# Patient Record
Sex: Female | Born: 1960 | Race: White | Hispanic: No | Smoking: Current every day smoker
Health system: Southern US, Community
[De-identification: ages and names within clinical notes are randomized; demographics above are authoritative.]

## PROBLEM LIST (undated history)

## (undated) DIAGNOSIS — J449 Chronic obstructive pulmonary disease, unspecified: Secondary | ICD-10-CM

## (undated) DIAGNOSIS — G2581 Restless legs syndrome: Secondary | ICD-10-CM

## (undated) DIAGNOSIS — F419 Anxiety disorder, unspecified: Secondary | ICD-10-CM

## (undated) DIAGNOSIS — Z87898 Personal history of other specified conditions: Secondary | ICD-10-CM

## (undated) DIAGNOSIS — F410 Panic disorder [episodic paroxysmal anxiety] without agoraphobia: Secondary | ICD-10-CM

## (undated) DIAGNOSIS — R519 Headache, unspecified: Secondary | ICD-10-CM

## (undated) DIAGNOSIS — R002 Palpitations: Secondary | ICD-10-CM

## (undated) DIAGNOSIS — J189 Pneumonia, unspecified organism: Secondary | ICD-10-CM

## (undated) DIAGNOSIS — I700 Atherosclerosis of aorta: Secondary | ICD-10-CM

## (undated) DIAGNOSIS — M810 Age-related osteoporosis without current pathological fracture: Secondary | ICD-10-CM

## (undated) DIAGNOSIS — N189 Chronic kidney disease, unspecified: Secondary | ICD-10-CM

## (undated) DIAGNOSIS — R0600 Dyspnea, unspecified: Secondary | ICD-10-CM

## (undated) DIAGNOSIS — I959 Hypotension, unspecified: Secondary | ICD-10-CM

## (undated) DIAGNOSIS — F32A Depression, unspecified: Secondary | ICD-10-CM

## (undated) HISTORY — DX: Age-related osteoporosis without current pathological fracture: M81.0

## (undated) HISTORY — DX: Personal history of other specified conditions: Z87.898

## (undated) HISTORY — DX: Depression, unspecified: F32.A

## (undated) HISTORY — DX: Atherosclerosis of aorta: I70.0

## (undated) HISTORY — DX: Panic disorder (episodic paroxysmal anxiety): F41.0

## (undated) HISTORY — DX: Hypotension, unspecified: I95.9

## (undated) HISTORY — DX: Palpitations: R00.2

## (undated) HISTORY — PX: ABDOMINAL HYSTERECTOMY: SHX81

---

## 2012-12-12 ENCOUNTER — Emergency Department (HOSPITAL_COMMUNITY): Payer: Self-pay

## 2012-12-12 ENCOUNTER — Emergency Department (HOSPITAL_COMMUNITY)
Admission: EM | Admit: 2012-12-12 | Discharge: 2012-12-12 | Disposition: A | Discharge status: Home / Self Care | Payer: Self-pay | Attending: Emergency Medicine | Admitting: Emergency Medicine

## 2012-12-12 ENCOUNTER — Encounter (HOSPITAL_COMMUNITY): Payer: Self-pay | Admitting: *Deleted

## 2012-12-12 DIAGNOSIS — J069 Acute upper respiratory infection, unspecified: Secondary | ICD-10-CM | POA: Insufficient documentation

## 2012-12-12 DIAGNOSIS — R5383 Other fatigue: Secondary | ICD-10-CM | POA: Insufficient documentation

## 2012-12-12 DIAGNOSIS — R059 Cough, unspecified: Secondary | ICD-10-CM | POA: Insufficient documentation

## 2012-12-12 DIAGNOSIS — F17200 Nicotine dependence, unspecified, uncomplicated: Secondary | ICD-10-CM | POA: Insufficient documentation

## 2012-12-12 DIAGNOSIS — R071 Chest pain on breathing: Secondary | ICD-10-CM | POA: Insufficient documentation

## 2012-12-12 DIAGNOSIS — IMO0001 Reserved for inherently not codable concepts without codable children: Secondary | ICD-10-CM | POA: Insufficient documentation

## 2012-12-12 DIAGNOSIS — R5381 Other malaise: Secondary | ICD-10-CM | POA: Insufficient documentation

## 2012-12-12 DIAGNOSIS — R1110 Vomiting, unspecified: Secondary | ICD-10-CM | POA: Insufficient documentation

## 2012-12-12 DIAGNOSIS — R05 Cough: Secondary | ICD-10-CM | POA: Insufficient documentation

## 2012-12-12 DIAGNOSIS — Z79899 Other long term (current) drug therapy: Secondary | ICD-10-CM | POA: Insufficient documentation

## 2012-12-12 DIAGNOSIS — R51 Headache: Secondary | ICD-10-CM | POA: Insufficient documentation

## 2012-12-12 LAB — BASIC METABOLIC PANEL
BUN: 10 mg/dL (ref 6–23)
CO2: 27 mEq/L (ref 19–32)
Chloride: 97 mEq/L (ref 96–112)
Creatinine, Ser: 0.76 mg/dL (ref 0.50–1.10)
GFR calc Af Amer: >90 mL/min (ref >90)
Glucose, Bld: 110 mg/dL — ABNORMAL HIGH (ref 70–99)

## 2012-12-12 LAB — CBC WITH DIFFERENTIAL/PLATELET
Basophils Absolute: 0 10*3/uL (ref 0.0–0.1)
Eosinophils Relative: 0 % (ref 0–5)
HCT: 44.4 % (ref 36.0–46.0)
Hemoglobin: 15.4 g/dL — ABNORMAL HIGH (ref 12.0–15.0)
Lymphocytes Relative: 16 % (ref 12–46)
MCHC: 34.7 g/dL (ref 30.0–36.0)
MCV: 87.1 fL (ref 78.0–100.0)
Monocytes Absolute: 0.4 10*3/uL (ref 0.1–1.0)
Monocytes Relative: 7 % (ref 3–12)
RDW: 12.3 % (ref 11.5–15.5)

## 2012-12-12 LAB — URINALYSIS, ROUTINE W REFLEX MICROSCOPIC
Bilirubin Urine: NEGATIVE
Ketones, ur: 40 mg/dL — AB
Nitrite: NEGATIVE
pH: 6 (ref 5.0–8.0)

## 2012-12-12 LAB — URINE MICROSCOPIC-ADD ON

## 2012-12-12 MED ORDER — SODIUM CHLORIDE 0.9 % IV BOLUS (SEPSIS)
1000.0000 mL | Freq: Once | INTRAVENOUS | Status: AC
  Administered 2012-12-12: 1000 mL via INTRAVENOUS

## 2012-12-12 MED ORDER — ACETAMINOPHEN 325 MG PO TABS
650.0000 mg | ORAL_TABLET | Freq: Once | ORAL | Status: AC
Start: 2012-12-12 — End: 2012-12-12
  Administered 2012-12-12: 650 mg via ORAL
  Filled 2012-12-12: qty 2

## 2012-12-12 MED ORDER — ONDANSETRON HCL 4 MG/2ML IJ SOLN
4.0000 mg | Freq: Once | INTRAMUSCULAR | Status: AC
  Administered 2012-12-12: 4 mg via INTRAVENOUS
  Filled 2012-12-12: qty 2

## 2012-12-12 NOTE — ED Notes (Signed)
Pt with flu like symptoms since Wednesday morning-body aches, fever, cough, HA, nausea-denies  Vomiting, ibuprofen earlier today

## 2012-12-12 NOTE — ED Provider Notes (Signed)
History     CSN: [id number]  Arrival date & time 12/12/12  1842   First MD Initiated Contact with Patient 12/12/12 1913      Chief Complaint  Patient presents with  . Fever    (Consider location/radiation/quality/duration/timing/severity/associated sxs/prior treatment) HPI Pt reports 2-3 days of flu-like symptoms, including fever, cough, chest soreness, general malaise, diffuse myalgias and persistent vomiting. She reports she has had difficulty keeping down much food or fluids the last 2 days. She is complaining of moderate diffuse headache as well.   History reviewed. No pertinent past medical history.  Past Surgical History  Procedure Date  . Abdominal hysterectomy     History reviewed. No pertinent family history.  History  Substance Use Topics  . Smoking status: Current Every Day Smoker  . Smokeless tobacco: Not on file  . Alcohol Use: No    OB History    Grav Para Term Preterm Abortions TAB SAB Ect Mult Living                  Review of Systems All other systems reviewed and are negative except as noted in HPI.   Allergies  Review of patient's allergies indicates no known allergies.  Home Medications   Current Outpatient Rx  Name  Route  Sig  Dispense  Refill  . DULOXETINE HCL 30 MG PO CPEP   Oral   Take 90 mg by mouth daily.           BP 105/72  Pulse 101  Temp 99.2 F (37.3 C) (Oral)  Resp 20  Ht 5\' 5"  (1.651 m)  Wt 155 lb (70.308 kg)  BMI 25.79 kg/m2  SpO2 96%  Physical Exam  Nursing note and vitals reviewed. Constitutional: She is oriented to person, place, and time. She appears well-developed and well-nourished.  HENT:  Head: Normocephalic and atraumatic.  Eyes: EOM are normal. Pupils are equal, round, and reactive to light.  Neck: Normal range of motion. Neck supple.  Cardiovascular: Normal rate, normal heart sounds and intact distal pulses.   Pulmonary/Chest: Effort normal and breath sounds normal.  Abdominal: Bowel sounds are  normal. She exhibits no distension. There is no tenderness.  Musculoskeletal: Normal range of motion. She exhibits no edema and no tenderness.  Neurological: She is alert and oriented to person, place, and time. She has normal strength. No cranial nerve deficit or sensory deficit.  Skin: Skin is warm and dry. No rash noted.  Psychiatric: She has a normal mood and affect.    ED Course  Procedures (including critical care time)  Labs Reviewed  CBC WITH DIFFERENTIAL - Abnormal; Notable for the following:    Hemoglobin 15.4 (*)     All other components within normal limits  URINALYSIS, ROUTINE W REFLEX MICROSCOPIC - Abnormal; Notable for the following:    Hgb urine dipstick LARGE (*)     Ketones, ur 40 (*)     All other components within normal limits  BASIC METABOLIC PANEL - Abnormal; Notable for the following:    Glucose, Bld 110 (*)     All other components within normal limits  URINE MICROSCOPIC-ADD ON - Abnormal; Notable for the following:    Squamous Epithelial / LPF FEW (*)     Bacteria, UA FEW (*)     All other components within normal limits   Dg Chest 2 View  12/12/2012  *RADIOLOGY REPORT*  Clinical Data: Fever, nausea, vomiting and diarrhea.  CHEST - 2 VIEW  Comparison: PA  and lateral chest 08/13/2012.  Findings: Lungs are clear.  Heart size is normal.  No pneumothorax or pleural effusion.  IMPRESSION: No acute disease.   Original Report Authenticated By: Holley Dexter, M.D.      No diagnosis found.    MDM  PT feeling better, labs and imaging unremarkable. Suspect that the patient has flu or other non-specific viral process. Will d/c home. Advised OTC symptomatic treatment.         Charles B. Bernette Mayers, MD 12/12/12 2105

## 2012-12-12 NOTE — ED Notes (Signed)
MD at bedside. 

## 2012-12-12 NOTE — ED Notes (Signed)
Fever, n/v, chest hurts, cough , headache, body aches.   Nonproductive cough,

## 2015-06-15 ENCOUNTER — Emergency Department (HOSPITAL_COMMUNITY)
Admission: EM | Admit: 2015-06-15 | Discharge: 2015-06-15 | Disposition: A | Discharge status: Home / Self Care | Payer: Self-pay | Attending: Emergency Medicine | Admitting: Emergency Medicine

## 2015-06-15 ENCOUNTER — Emergency Department (HOSPITAL_COMMUNITY): Payer: Self-pay

## 2015-06-15 ENCOUNTER — Encounter (HOSPITAL_COMMUNITY): Payer: Self-pay

## 2015-06-15 DIAGNOSIS — J449 Chronic obstructive pulmonary disease, unspecified: Secondary | ICD-10-CM | POA: Insufficient documentation

## 2015-06-15 DIAGNOSIS — Z720 Tobacco use: Secondary | ICD-10-CM | POA: Insufficient documentation

## 2015-06-15 DIAGNOSIS — F419 Anxiety disorder, unspecified: Secondary | ICD-10-CM | POA: Insufficient documentation

## 2015-06-15 DIAGNOSIS — M5090 Cervical disc disorder, unspecified, unspecified cervical region: Secondary | ICD-10-CM | POA: Insufficient documentation

## 2015-06-15 DIAGNOSIS — M5030 Other cervical disc degeneration, unspecified cervical region: Secondary | ICD-10-CM

## 2015-06-15 DIAGNOSIS — Z79899 Other long term (current) drug therapy: Secondary | ICD-10-CM | POA: Insufficient documentation

## 2015-06-15 DIAGNOSIS — M436 Torticollis: Secondary | ICD-10-CM | POA: Insufficient documentation

## 2015-06-15 DIAGNOSIS — M25512 Pain in left shoulder: Secondary | ICD-10-CM | POA: Insufficient documentation

## 2015-06-15 DIAGNOSIS — R51 Headache: Secondary | ICD-10-CM | POA: Insufficient documentation

## 2015-06-15 HISTORY — DX: Anxiety disorder, unspecified: F41.9

## 2015-06-15 HISTORY — DX: Chronic obstructive pulmonary disease, unspecified: J44.9

## 2015-06-15 MED ORDER — IBUPROFEN 800 MG PO TABS
800.0000 mg | ORAL_TABLET | Freq: Once | ORAL | Status: AC
  Administered 2015-06-15: 800 mg via ORAL
  Filled 2015-06-15: qty 1

## 2015-06-15 MED ORDER — IBUPROFEN 600 MG PO TABS: 600.0000 mg | ORAL_TABLET | Freq: Four times a day (QID) | ORAL | Status: DC | PRN

## 2015-06-15 MED ORDER — CYCLOBENZAPRINE HCL 5 MG PO TABS: 5.0000 mg | ORAL_TABLET | Freq: Three times a day (TID) | ORAL | Status: DC | PRN

## 2015-06-15 MED ORDER — CYCLOBENZAPRINE HCL 10 MG PO TABS
10.0000 mg | ORAL_TABLET | Freq: Once | ORAL | Status: AC
  Administered 2015-06-15: 10 mg via ORAL
  Filled 2015-06-15: qty 1

## 2015-06-15 NOTE — Discharge Instructions (Signed)
Degenerative Disk Disease °Degenerative disk disease is a condition caused by the changes that occur in the cushions of the backbone (spinal disks) as you grow older. Spinal disks are soft and compressible disks located between the bones of the spine (vertebrae). They act like shock absorbers. Degenerative disk disease can affect the whole spine. However, the neck and lower back are most commonly affected. Many changes can occur in the spinal disks with aging, such as: °· The spinal disks may dry and shrink. °· Small tears may occur in the tough, outer covering of the disk (annulus). °· The disk space may become smaller due to loss of water. °· Abnormal growths in the bone (spurs) may occur. This can put pressure on the nerve roots exiting the spinal canal, causing pain. °· The spinal canal may become narrowed. °CAUSES  °Degenerative disk disease is a condition caused by the changes that occur in the spinal disks with aging. The exact cause is not known, but there is a genetic basis for many patients. Degenerative changes can occur due to loss of fluid in the disk. This makes the disk thinner and reduces the space between the backbones. Small cracks can develop in the outer layer of the disk. This can lead to the breakdown of the disk. You are more likely to get degenerative disk disease if you are overweight. Smoking cigarettes and doing heavy work such as weightlifting can also increase your risk of this condition. Degenerative changes can start after a sudden injury. Growth of bone spurs can compress the nerve roots and cause pain.  °SYMPTOMS  °The symptoms vary from person to person. Some people may have no pain, while others have severe pain. The pain may be so severe that it can limit your activities. The location of the pain depends on the part of your backbone that is affected. You will have neck or arm pain if a disk in the neck area is affected. You will have pain in your back, buttocks, or legs if a disk  in the lower back is affected. The pain becomes worse while bending, reaching up, or with twisting movements. The pain may start gradually and then get worse as time passes. It may also start after a major or minor injury. You may feel numbness or tingling in the arms or legs.  °DIAGNOSIS  °Your caregiver will ask you about your symptoms and about activities or habits that may cause the pain. He or she may also ask about any injuries, diseases, or treatments you have had earlier. Your caregiver will examine you to check for the range of movement that is possible in the affected area, to check for strength in your extremities, and to check for sensation in the areas of the arms and legs supplied by different nerve roots. An X-ray of the spine may be taken. Your caregiver may suggest other imaging tests, such as magnetic resonance imaging (MRI), if needed.  °TREATMENT  °Treatment includes rest, modifying your activities, and applying ice and heat. Your caregiver may prescribe medicines to reduce your pain and may ask you to do some exercises to strengthen your back. In some cases, you may need surgery. You and your caregiver will decide on the treatment that is best for you. °HOME CARE INSTRUCTIONS  °· Follow proper lifting and walking techniques as advised by your caregiver. °· Maintain good posture. °· Exercise regularly as advised. °· Perform relaxation exercises. °· Change your sitting, standing, and sleeping habits as advised. Change positions   frequently. °· Lose weight as advised. °· Stop smoking if you smoke. °· Wear supportive footwear. °SEEK MEDICAL CARE IF:  °Your pain does not go away within 1 to 4 weeks. °SEEK IMMEDIATE MEDICAL CARE IF:  °· Your pain is severe. °· You notice weakness in your arms, hands, or legs. °· You begin to lose control of your bladder or bowel movements. °MAKE SURE YOU:  °· Understand these instructions. °· Will watch your condition. °· Will get help right away if you are not doing  well or get worse. °Document Released: 09/02/2007 Document Revised: 01/28/2012 Document Reviewed: 03/09/2014 °[employer]® Patient Information ©2015 [employer]. This information is not intended to replace advice given to you by your health care provider. Make sure you discuss any questions you have with your health care provider. °Torticollis, Acute °You have suddenly (acutely) developed a twisted neck (torticollis). This is usually a self-limited condition. °CAUSES  °Acute torticollis may be caused by malposition, trauma or infection. Most commonly, acute torticollis is caused by sleeping in an awkward position. Torticollis may also be caused by the flexion, extension or twisting of the neck muscles beyond their normal position. Sometimes, the exact cause may not be known. °SYMPTOMS  °Usually, there is pain and limited movement of the neck. Your neck may twist to one side. °DIAGNOSIS  °The diagnosis is often made by physical examination. X-rays, CT scans or MRIs may be done if there is a history of trauma or concern of infection. °TREATMENT  °For a common, stiff neck that develops during sleep, treatment is focused on relaxing the contracted neck muscle. Medications (including shots) may be used to treat the problem. Most cases resolve in several days. Torticollis usually responds to conservative physical therapy. If left untreated, the shortened and spastic neck muscle can cause deformities in the face and neck. Rarely, surgery is required. °HOME CARE INSTRUCTIONS  °· Use over-the-counter and prescription medications as directed by your caregiver. °· Do stretching exercises and massage the neck as directed by your caregiver. °· Follow up with physical therapy if needed and as directed by your caregiver. °SEEK IMMEDIATE MEDICAL CARE IF:  °· You develop difficulty breathing or noisy breathing (stridor). °· You drool, develop trouble swallowing or have pain with swallowing. °· You develop numbness or weakness in the  hands or feet. °· You have changes in speech or vision. °· You have problems with urination or bowel movements. °· You have difficulty walking. °· You have a fever. °· You have increased pain. °MAKE SURE YOU:  °· Understand these instructions. °· Will watch your condition. °· Will get help right away if you are not doing well or get worse. °Document Released: 11/02/2000 Document Revised: 01/28/2012 Document Reviewed: 12/14/2009 °[employer]® Patient Information ©2015 [employer]. This information is not intended to replace advice given to you by your health care provider. Make sure you discuss any questions you have with your health care provider. ° °

## 2015-06-15 NOTE — ED Notes (Signed)
Pt reports left sided neck and shoulder pain x 1 week.  Reports headache as well at this time.   Denies fever. Pt says 1 month ago she donated plasma and passed out.  Pt C/O soreness to left arm.

## 2015-06-15 NOTE — ED Provider Notes (Signed)
CSN: [id number]     Arrival date & time 06/15/15  4098 History   First MD Initiated Contact with Patient 06/15/15 [phone]     Chief Complaint  Patient presents with  . Neck Pain  . Headache     (Consider location/radiation/quality/duration/timing/severity/associated sxs/prior Treatment) The history is provided by the patient.   [name] is a 54 y.o. female presenting with a 1 week history of left neck and shoulder pain along with generalized headache.  She denies injury, stating she cannot recall any inciting event and also cannot recall if the symptom started gradually or abruptly.  Her pain is worsened with leftward head rotation and better at rest.   She denies fevers, chills, weakness or numbness in her arms, denies dizziness, focal weakness, difficulty with speech.  She has taken tylenol without relief.  She also notes she had significant swelling and bruising in the left forearm one month ago after donating plasma which she has done in the past, but never with swelling or bruising, she endorses passing out at the time of that donation.  Denies any recent dizziness, syncope.  She has persistent pain at her left medial elbow since that occurrence, no further swelling.      Past Medical History  Diagnosis Date  . COPD (chronic obstructive pulmonary disease)   . Anxiety    Past Surgical History  Procedure Laterality Date  . Abdominal hysterectomy     No family history on file. History  Substance Use Topics  . Smoking status: Current Every Day Smoker  . Smokeless tobacco: Not on file  . Alcohol Use: No   OB History    No data available     Review of Systems  Constitutional: Negative for fever and chills.  Musculoskeletal: Positive for arthralgias, neck pain and neck stiffness. Negative for myalgias, back pain and joint swelling.  Neurological: Positive for headaches. Negative for dizziness, facial asymmetry, speech difficulty, weakness and numbness.      Allergies   Review of patient's allergies indicates no known allergies.  Home Medications   Prior to Admission medications   Medication Sig Start Date End Date Taking? Authorizing Provider  acetaminophen (TYLENOL) 500 MG tablet Take 1,000 mg by mouth every 6 (six) hours as needed for moderate pain or headache.   Yes Historical Provider, MD  ALPRAZolam Prudy Feeler) 1 MG tablet Take 1 mg by mouth 3 (three) times daily as needed for anxiety.    Yes Historical Provider, MD  Menthol, Topical Analgesic, (BENGAY EX) Apply 1 application topically daily as needed (shoulder pain).   Yes Historical Provider, MD  PARoxetine (PAXIL) 20 MG tablet Take 20 mg by mouth daily.   Yes Historical Provider, MD  zolpidem (AMBIEN) 10 MG tablet Take 10 mg by mouth at bedtime as needed. For sleep   Yes Historical Provider, MD  cyclobenzaprine (FLEXERIL) 5 MG tablet Take 1 tablet (5 mg total) by mouth 3 (three) times daily as needed for muscle spasms. 06/15/15   Burgess Amor, PA-C  ibuprofen (ADVIL,MOTRIN) 600 MG tablet Take 1 tablet (600 mg total) by mouth every 6 (six) hours as needed. 06/15/15   Burgess Amor, PA-C   BP 125/95 mmHg  Pulse 65  Temp(Src) 97.3 F (36.3 C) (Oral)  Resp 16  Ht  (1.626 m)  Wt 153 lb (69.4 kg)  BMI 26.25 kg/m2  SpO2 99% Physical Exam  Constitutional: She appears well-developed and well-nourished.  HENT:  Head: Normocephalic and atraumatic.  Eyes: EOM are normal. Pupils  are equal, round, and reactive to light.  Neck: Neck supple. Muscular tenderness present. No spinous process tenderness present. Carotid bruit is not present. Decreased range of motion present. No erythema present.  ttp left trapezius and neck soft tissue, no edema.  Pain with left head rotation, ROM limited in this direction only.    Cardiovascular: Normal rate, regular rhythm, normal heart sounds and intact distal pulses.   Pulses:      Radial pulses are 2+ on the right side, and 2+ on the left side.  Pulmonary/Chest: Effort  normal and breath sounds normal. She has no wheezes.  Abdominal: Soft. Bowel sounds are normal. There is no tenderness.  Musculoskeletal:       Left elbow: She exhibits no swelling, no effusion and no deformity. Tenderness found. Medial epicondyle tenderness noted.  Lymphadenopathy:    She has no cervical adenopathy.  Neurological: She is alert. She has normal strength. No cranial nerve deficit or sensory deficit.  Equal grip strength.  Skin: Skin is warm and dry. No bruising and no rash noted.  Psychiatric: She has a normal mood and affect.  Nursing note and vitals reviewed.   ED Course  Procedures (including critical care time) Labs Review Labs Reviewed - No data to display  Imaging Review Dg Cervical Spine Complete  06/15/2015   CLINICAL DATA:  Left-sided neck pain and left arm pain for 1 month. Decreased range of motion.  EXAM: CERVICAL SPINE  4+ VIEWS  COMPARISON:  None.  FINDINGS: There is slight narrowing of the C5-6 and C6-7 disc spaces. There is no facet arthritis or foraminal stenosis. Minimal uncinate spurs at C5-6 bilaterally.  Diffuse osteopenia.  No prevertebral soft tissue swelling.  IMPRESSION: Minimal degenerative disc disease at C5-6 and C6-7.   Electronically Signed   By: Francene Boyers M.D.   On: 06/15/2015 10:00     EKG Interpretation None      MDM   Final diagnoses:  Torticollis, acute  DDD (degenerative disc disease), cervical    Patients labs and/or radiological studies were reviewed and considered during the medical decision making and disposition process.  Results were also discussed with patient. Pt was placed on flexeril, ibuprofen, advised ROM exercises which were demonstrated after heat tx tid to qid. Prn f/u with pcp if not improving over the next week.  No exam findings to suggest meningitis, left arm is soft, nontender except at medial malleolus, doubt upper extremity dvt, no periph edema, pulses normal.  The patient appears reasonably screened  and/or stabilized for discharge and I doubt any other medical condition or other [facility] requiring further screening, evaluation, or treatment in the ED at this time prior to discharge.    Burgess Amor, PA-C 06/15/15 1820  Bethann Berkshire, MD 06/27/15 [phone]

## 2015-06-15 NOTE — ED Notes (Signed)
MD at bedside. 

## 2015-07-22 ENCOUNTER — Emergency Department (HOSPITAL_COMMUNITY): Payer: Self-pay

## 2015-07-22 ENCOUNTER — Encounter (HOSPITAL_COMMUNITY): Payer: Self-pay | Admitting: *Deleted

## 2015-07-22 ENCOUNTER — Emergency Department (HOSPITAL_COMMUNITY)
Admission: EM | Admit: 2015-07-22 | Discharge: 2015-07-22 | Disposition: A | Discharge status: Home / Self Care | Payer: Self-pay | Attending: Physician Assistant | Admitting: Physician Assistant

## 2015-07-22 DIAGNOSIS — F419 Anxiety disorder, unspecified: Secondary | ICD-10-CM | POA: Insufficient documentation

## 2015-07-22 DIAGNOSIS — M545 Low back pain: Secondary | ICD-10-CM | POA: Insufficient documentation

## 2015-07-22 DIAGNOSIS — Z79899 Other long term (current) drug therapy: Secondary | ICD-10-CM | POA: Insufficient documentation

## 2015-07-22 DIAGNOSIS — Z720 Tobacco use: Secondary | ICD-10-CM | POA: Insufficient documentation

## 2015-07-22 DIAGNOSIS — J441 Chronic obstructive pulmonary disease with (acute) exacerbation: Secondary | ICD-10-CM | POA: Insufficient documentation

## 2015-07-22 DIAGNOSIS — J449 Chronic obstructive pulmonary disease, unspecified: Secondary | ICD-10-CM

## 2015-07-22 DIAGNOSIS — R079 Chest pain, unspecified: Secondary | ICD-10-CM | POA: Insufficient documentation

## 2015-07-22 LAB — BASIC METABOLIC PANEL
Anion gap: 7 (ref 5–15)
BUN: 17 mg/dL (ref 6–20)
CHLORIDE: 106 mmol/L (ref 101–111)
CO2: 24 mmol/L (ref 22–32)
Calcium: 8.5 mg/dL — ABNORMAL LOW (ref 8.9–10.3)
Creatinine, Ser: 0.84 mg/dL (ref 0.44–1.00)
GFR calc Af Amer: >60 mL/min (ref >60)
GFR calc non Af Amer: >60 mL/min (ref >60)
GLUCOSE: 111 mg/dL — AB (ref 65–99)
POTASSIUM: 3.9 mmol/L (ref 3.5–5.1)
Sodium: 137 mmol/L (ref 135–145)

## 2015-07-22 LAB — CBC WITH DIFFERENTIAL/PLATELET
Basophils Absolute: 0.1 10*3/uL (ref 0.0–0.1)
Basophils Relative: 0 % (ref 0–1)
EOS PCT: 2 % (ref 0–5)
Eosinophils Absolute: 0.2 10*3/uL (ref 0.0–0.7)
HEMATOCRIT: 40.5 % (ref 36.0–46.0)
Hemoglobin: 13.8 g/dL (ref 12.0–15.0)
LYMPHS ABS: 3.7 10*3/uL (ref 0.7–4.0)
LYMPHS PCT: 31 % (ref 12–46)
MCH: 30.2 pg (ref 26.0–34.0)
MCHC: 34.1 g/dL (ref 30.0–36.0)
MCV: 88.6 fL (ref 78.0–100.0)
MONO ABS: 0.8 10*3/uL (ref 0.1–1.0)
Monocytes Relative: 7 % (ref 3–12)
Neutro Abs: 7.2 10*3/uL (ref 1.7–7.7)
Neutrophils Relative %: 60 % (ref 43–77)
PLATELETS: 277 10*3/uL (ref 150–400)
RBC: 4.57 MIL/uL (ref 3.87–5.11)
RDW: 12.5 % (ref 11.5–15.5)
WBC: 12 10*3/uL — ABNORMAL HIGH (ref 4.0–10.5)

## 2015-07-22 LAB — TROPONIN I: Troponin I: <0.03 ng/mL (ref <0.031)

## 2015-07-22 MED ORDER — PREDNISONE 50 MG PO TABS
60.0000 mg | ORAL_TABLET | Freq: Once | ORAL | Status: AC
  Administered 2015-07-22: 60 mg via ORAL
  Filled 2015-07-22 (×2): qty 1

## 2015-07-22 MED ORDER — AZITHROMYCIN 250 MG PO TABS
500.0000 mg | ORAL_TABLET | Freq: Once | ORAL | Status: AC
  Administered 2015-07-22: 500 mg via ORAL
  Filled 2015-07-22: qty 2

## 2015-07-22 MED ORDER — PREDNISONE 20 MG PO TABS: ORAL_TABLET | ORAL | Status: DC

## 2015-07-22 MED ORDER — AZITHROMYCIN 250 MG PO TABS: 250.0000 mg | ORAL_TABLET | Freq: Every day | ORAL | Status: DC

## 2015-07-22 MED ORDER — IPRATROPIUM-ALBUTEROL 0.5-2.5 (3) MG/3ML IN SOLN
3.0000 mL | Freq: Once | RESPIRATORY_TRACT | Status: AC
  Administered 2015-07-22: 3 mL via RESPIRATORY_TRACT
  Filled 2015-07-22: qty 3

## 2015-07-22 NOTE — ED Notes (Signed)
Pt alert & oriented x4, stable gait. Patient given discharge instructions, paperwork & prescription(s). Patient  instructed to stop at the registration desk to finish any additional paperwork. Patient verbalized understanding. Pt left department w/ no further questions. 

## 2015-07-22 NOTE — ED Notes (Signed)
Pt c/o pain in her back and chest when she takes a deep breath and coughs. Pt states she woke up having bad pain in her back Thursday morning. Pt states she she is coughing up yellow phlegm.

## 2015-07-22 NOTE — ED Notes (Signed)
   07/22/15 2007  Cough  Cough Present Yes  Cough Congested;Productive  Sputum Amount Small  Sputum Color Yellow  Pt reports productive cough since Thursday. Says yellow in color. Pain to the left back when she coughs or takes a deep breath.

## 2015-07-22 NOTE — ED Provider Notes (Addendum)
CSN: [id number]     Arrival date & time 07/22/15  1953 History  This chart was scribed for Alicia Yu Randall An, MD by Alicia Sanchez, ED scribe. This patient was seen in room APA03/APA03 and the patient's care was started at 8:28 PM.   Chief Complaint  Patient presents with  . Cough   The history is provided by the patient. No language interpreter was used.    HPI Comments: Alicia Sanchez is a 54 y.o. female, with a hx of COPD, who presents to the Emergency Department complaining of constant, moderate, diffuse, back pain with onset yesterday morning. She notes associated CP, a worsening cough with yellow sputum and further notes a worsening of her pain with deep breaths or when coughing. Pt notes using her inhalers which have provided little relief. She also notes nausea and vomiting but says that they are chronic and haven't worsened since the onset of her current symptoms. She denies any current health coverage or having a PCP. She denies fevers or chills.   Past Medical History  Diagnosis Date  . COPD (chronic obstructive pulmonary disease)   . Anxiety    Past Surgical History  Procedure Laterality Date  . Abdominal hysterectomy     History reviewed. No pertinent family history. Social History  Substance Use Topics  . Smoking status: Current Every Day Smoker  . Smokeless tobacco: None  . Alcohol Use: No   OB History    No data available     Review of Systems  Respiratory: Positive for cough and shortness of breath.   Cardiovascular: Positive for chest pain.  Musculoskeletal: Positive for back pain.  All other systems reviewed and are negative.  Allergies  Review of patient's allergies indicates no known allergies.  Home Medications   Prior to Admission medications   Medication Sig Start Date End Date Taking? Authorizing Provider  acetaminophen (TYLENOL) 500 MG tablet Take 1,000 mg by mouth every 6 (six) hours as needed for moderate pain or headache.    Historical  Provider, MD  ALPRAZolam Prudy Feeler) 1 MG tablet Take 1 mg by mouth 3 (three) times daily as needed for anxiety.     Historical Provider, MD  cyclobenzaprine (FLEXERIL) 5 MG tablet Take 1 tablet (5 mg total) by mouth 3 (three) times daily as needed for muscle spasms. 06/15/15   Alicia Amor, PA-C  ibuprofen (ADVIL,MOTRIN) 600 MG tablet Take 1 tablet (600 mg total) by mouth every 6 (six) hours as needed. 06/15/15   Alicia Amor, PA-C  Menthol, Topical Analgesic, (BENGAY EX) Apply 1 application topically daily as needed (shoulder pain).    Historical Provider, MD  PARoxetine (PAXIL) 20 MG tablet Take 20 mg by mouth daily.    Historical Provider, MD  zolpidem (AMBIEN) 10 MG tablet Take 10 mg by mouth at bedtime as needed. For sleep    Historical Provider, MD   BP 112/68 mmHg  Pulse 87  Temp(Src) 97.9 F (36.6 C) (Oral)  Resp 24  Ht  (1.651 m)  Wt 151 lb 2 oz (68.55 kg)  BMI 25.15 kg/m2  SpO2 97% Physical Exam  Constitutional: She is oriented to person, place, and time. She appears well-developed.  HENT:  Head: Normocephalic and atraumatic.  Mouth/Throat: No oropharyngeal exudate.  Neck: Normal range of motion. No tracheal deviation present.  Cardiovascular: Normal rate.   Pulmonary/Chest: She has wheezes. She has rhonchi.  Abdominal: Soft. There is no tenderness.  Musculoskeletal: Normal range of motion.  Neurological: She is alert and  oriented to person, place, and time.  Skin: Skin is warm and dry. She is not diaphoretic.  Psychiatric: She has a normal mood and affect. Her behavior is normal.  Nursing note and vitals reviewed.  ED Course  Procedures  DIAGNOSTIC STUDIES: Oxygen Saturation is 97% on RA, normal by my interpretation.    COORDINATION OF CARE: 8:32 PM Discussed treatment plan with pt at bedside and pt agreed to plan.  Labs Review Labs Reviewed  BASIC METABOLIC PANEL  CBC WITH DIFFERENTIAL/PLATELET  TROPONIN I    Imaging Review Dg Chest 2 View  07/22/2015    CLINICAL DATA:  54 year old female with left-sided posterior chest pain for the past 2 days.  EXAM: CHEST  2 VIEW  COMPARISON:  Chest x-ray a 02/22/2015.  FINDINGS: Mild diffuse peribronchial cuffing and coarsening of the interstitial markings appears to be chronic and is similar to prior study 02/22/2015. No acute consolidative airspace disease. No pleural effusions. No evidence of pulmonary edema. Heart size and mediastinal contours are within normal limits.  IMPRESSION: 1. No radiographic evidence of acute cardiopulmonary disease. 2. Chronic lung changes likely related to chronic bronchitis redemonstrated, as above.   Electronically Signed   By: Trudie Reed M.D.   On: 07/22/2015 20:26   I have personally reviewed and evaluated these images and lab results as part of my medical decision-making.   EKG Interpretation None       ED ECG REPORT   Date: 07/22/2015  Rate: 54   Rhythm: normal sinus rhythm  QRS Axis: normal  Intervals: normal  ST/T Wave abnormalities: normal  Conduction Disutrbances:none  Narrative Interpretation:   Old EKG Reviewed: none available   No STEMI.  I have personally reviewed the EKG tracing and agree with the computerized printout as noted.   MDM   Final diagnoses:  None    Patient is a 54 year old female with history of COPD current smoker. Presenting today with cough and pain with deep breaths. Patient has no fever. No nausea vomiting diarrhea. Suspect pneumonia versus COPD exacerbation. Patient has extensive wheezing and rhonchi bilateral lungs. X-ray negative. We'll treat with duonebs and prednisone.  I personally performed the services described in this documentation, which was scribed in my presence. The recorded information has been reviewed and is accurate.  Patient has been using her husband's inhalers because she does not have insurance. We encouraged her to follow up with primary care provider and get her own prescription. However in the  setting that she doesn't do that, we encouraged her to take Sanchez husband's inhalers as needed.   Alicia Deamer Randall An, MD 07/22/15 2051  Alicia Lechuga Randall An, MD 07/22/15 2051  Alicia Duff Randall An, MD 07/22/15 1610

## 2015-07-22 NOTE — Discharge Instructions (Signed)
You need to follow-up find a primary care physician. You will continue to need to use inhalers. Please take this course of prednisone.  Chronic Obstructive Pulmonary Disease Chronic obstructive pulmonary disease (COPD) is a common lung condition in which airflow from the lungs is limited. COPD is a general term that can be used to describe many different lung problems that limit airflow, including both chronic bronchitis and emphysema. If you have COPD, your lung function will probably never return to normal, but there are measures you can take to improve lung function and make yourself feel better.  CAUSES   Smoking (common).   Exposure to secondhand smoke.   Genetic problems.  Chronic inflammatory lung diseases or recurrent infections. SYMPTOMS   Shortness of breath, especially with physical activity.   Deep, persistent (chronic) cough with a large amount of thick mucus.   Wheezing.   Rapid breaths (tachypnea).   Gray or bluish discoloration (cyanosis) of the skin, especially in fingers, toes, or lips.   Fatigue.   Weight loss.   Frequent infections or episodes when breathing symptoms become much worse (exacerbations).   Chest tightness. DIAGNOSIS  Your health care provider will take a medical history and perform a physical examination to make the initial diagnosis. Additional tests for COPD may include:   Lung (pulmonary) function tests.  Chest X-ray.  CT scan.  Blood tests. TREATMENT  Treatment available to help you feel better when you have COPD includes:   Inhaler and nebulizer medicines. These help manage the symptoms of COPD and make your breathing more comfortable.  Supplemental oxygen. Supplemental oxygen is only helpful if you have a low oxygen level in your blood.   Exercise and physical activity. These are beneficial for nearly all people with COPD. Some people may also benefit from a pulmonary rehabilitation program. HOME CARE INSTRUCTIONS     Take all medicines (inhaled or pills) as directed by your health care provider.  Avoid over-the-counter medicines or cough syrups that dry up your airway (such as antihistamines) and slow down the elimination of secretions unless instructed otherwise by your health care provider.   If you are a smoker, the most important thing that you can do is stop smoking. Continuing to smoke will cause further lung damage and breathing trouble. Ask your health care provider for help with quitting smoking. He or she can direct you to community resources or hospitals that provide support.  Avoid exposure to irritants such as smoke, chemicals, and fumes that aggravate your breathing.  Use oxygen therapy and pulmonary rehabilitation if directed by your health care provider. If you require home oxygen therapy, ask your health care provider whether you should purchase a pulse oximeter to measure your oxygen level at home.   Avoid contact with individuals who have a contagious illness.  Avoid extreme temperature and humidity changes.  Eat healthy foods. Eating smaller, more frequent meals and resting before meals may help you maintain your strength.  Stay active, but balance activity with periods of rest. Exercise and physical activity will help you maintain your ability to do things you want to do.  Preventing infection and hospitalization is very important when you have COPD. Make sure to receive all the vaccines your health care provider recommends, especially the pneumococcal and influenza vaccines. Ask your health care provider whether you need a pneumonia vaccine.  Learn and use relaxation techniques to manage stress.  Learn and use controlled breathing techniques as directed by your health care provider. Controlled breathing techniques  include:   Pursed lip breathing. Start by breathing in (inhaling) through your nose for 1 second. Then, purse your lips as if you were going to whistle and breathe  out (exhale) through the pursed lips for 2 seconds.   Diaphragmatic breathing. Start by putting one hand on your abdomen just above your waist. Inhale slowly through your nose. The hand on your abdomen should move out. Then purse your lips and exhale slowly. You should be able to feel the hand on your abdomen moving in as you exhale.   Learn and use controlled coughing to clear mucus from your lungs. Controlled coughing is a series of short, progressive coughs. The steps of controlled coughing are:  1. Lean your head slightly forward.  2. Breathe in deeply using diaphragmatic breathing.  3. Try to hold your breath for 3 seconds.  4. Keep your mouth slightly open while coughing twice.  5. Spit any mucus out into a tissue.  6. Rest and repeat the steps once or twice as needed. SEEK MEDICAL CARE IF:   You are coughing up more mucus than usual.   There is a change in the color or thickness of your mucus.   Your breathing is more labored than usual.   Your breathing is faster than usual.  SEEK IMMEDIATE MEDICAL CARE IF:   You have shortness of breath while you are resting.   You have shortness of breath that prevents you from:  Being able to talk.   Performing your usual physical activities.   You have chest pain lasting longer than 5 minutes.   Your skin color is more cyanotic than usual.  You measure low oxygen saturations for longer than 5 minutes with a pulse oximeter. MAKE SURE YOU:   Understand these instructions.  Will watch your condition.  Will get help right away if you are not doing well or get worse. Document Released: 08/15/2005 Document Revised: 03/22/2014 Document Reviewed: 07/02/2013 [facility] Patient Information 2015 [place], [place]. This information is not intended to replace advice given to you by your health care provider. Make sure you discuss any questions you have with your health care provider.

## 2015-11-19 ENCOUNTER — Inpatient Hospital Stay (HOSPITAL_COMMUNITY)
Admission: EM | Admit: 2015-11-19 | Discharge: 2015-11-22 | DRG: 191 | Disposition: A | Discharge status: Home / Self Care | Payer: Self-pay | Attending: Internal Medicine | Admitting: Internal Medicine

## 2015-11-19 ENCOUNTER — Encounter (HOSPITAL_COMMUNITY): Payer: Self-pay | Admitting: Emergency Medicine

## 2015-11-19 ENCOUNTER — Emergency Department (HOSPITAL_COMMUNITY): Payer: Self-pay

## 2015-11-19 DIAGNOSIS — J441 Chronic obstructive pulmonary disease with (acute) exacerbation: Secondary | ICD-10-CM | POA: Diagnosis present

## 2015-11-19 DIAGNOSIS — R0602 Shortness of breath: Secondary | ICD-10-CM

## 2015-11-19 DIAGNOSIS — I471 Supraventricular tachycardia: Secondary | ICD-10-CM | POA: Diagnosis present

## 2015-11-19 DIAGNOSIS — T380X5A Adverse effect of glucocorticoids and synthetic analogues, initial encounter: Secondary | ICD-10-CM | POA: Diagnosis present

## 2015-11-19 DIAGNOSIS — F17210 Nicotine dependence, cigarettes, uncomplicated: Secondary | ICD-10-CM | POA: Diagnosis present

## 2015-11-19 DIAGNOSIS — R519 Headache, unspecified: Secondary | ICD-10-CM

## 2015-11-19 DIAGNOSIS — J440 Chronic obstructive pulmonary disease with acute lower respiratory infection: Principal | ICD-10-CM | POA: Diagnosis present

## 2015-11-19 DIAGNOSIS — H510 Palsy (spasm) of conjugate gaze: Secondary | ICD-10-CM | POA: Insufficient documentation

## 2015-11-19 DIAGNOSIS — I959 Hypotension, unspecified: Secondary | ICD-10-CM | POA: Diagnosis present

## 2015-11-19 DIAGNOSIS — Z720 Tobacco use: Secondary | ICD-10-CM | POA: Diagnosis present

## 2015-11-19 DIAGNOSIS — Z833 Family history of diabetes mellitus: Secondary | ICD-10-CM

## 2015-11-19 DIAGNOSIS — D72829 Elevated white blood cell count, unspecified: Secondary | ICD-10-CM | POA: Diagnosis present

## 2015-11-19 DIAGNOSIS — J069 Acute upper respiratory infection, unspecified: Secondary | ICD-10-CM | POA: Diagnosis present

## 2015-11-19 DIAGNOSIS — R51 Headache: Secondary | ICD-10-CM | POA: Diagnosis present

## 2015-11-19 DIAGNOSIS — J9601 Acute respiratory failure with hypoxia: Secondary | ICD-10-CM

## 2015-11-19 DIAGNOSIS — J209 Acute bronchitis, unspecified: Secondary | ICD-10-CM | POA: Diagnosis present

## 2015-11-19 DIAGNOSIS — F418 Other specified anxiety disorders: Secondary | ICD-10-CM | POA: Diagnosis present

## 2015-11-19 DIAGNOSIS — Z825 Family history of asthma and other chronic lower respiratory diseases: Secondary | ICD-10-CM

## 2015-11-19 LAB — CBC
HEMATOCRIT: 41.3 % (ref 36.0–46.0)
Hemoglobin: 14.3 g/dL (ref 12.0–15.0)
MCH: 30.6 pg (ref 26.0–34.0)
MCHC: 34.6 g/dL (ref 30.0–36.0)
MCV: 88.2 fL (ref 78.0–100.0)
Platelets: 272 10*3/uL (ref 150–400)
RBC: 4.68 MIL/uL (ref 3.87–5.11)
RDW: 12.5 % (ref 11.5–15.5)
WBC: 14.3 10*3/uL — AB (ref 4.0–10.5)

## 2015-11-19 LAB — BASIC METABOLIC PANEL
ANION GAP: 8 (ref 5–15)
BUN: 8 mg/dL (ref 6–20)
CALCIUM: 9.1 mg/dL (ref 8.9–10.3)
CO2: 26 mmol/L (ref 22–32)
Chloride: 104 mmol/L (ref 101–111)
Creatinine, Ser: 0.79 mg/dL (ref 0.44–1.00)
GFR calc Af Amer: >60 mL/min (ref >60)
GLUCOSE: 98 mg/dL (ref 65–99)
POTASSIUM: 3.9 mmol/L (ref 3.5–5.1)
SODIUM: 138 mmol/L (ref 135–145)

## 2015-11-19 LAB — TROPONIN I

## 2015-11-19 LAB — PROCALCITONIN: Procalcitonin: <0.1 ng/mL

## 2015-11-19 MED ORDER — METHYLPREDNISOLONE SODIUM SUCC 125 MG IJ SOLR
60.0000 mg | Freq: Four times a day (QID) | INTRAMUSCULAR | Status: DC
  Administered 2015-11-19 – 2015-11-22 (×9): 60 mg via INTRAVENOUS
  Filled 2015-11-19 (×9): qty 2

## 2015-11-19 MED ORDER — MELATONIN 10 MG PO CAPS: 1.0000 | ORAL_CAPSULE | Freq: Every evening | ORAL | Status: DC | PRN

## 2015-11-19 MED ORDER — SODIUM CHLORIDE 0.9 % IJ SOLN
3.0000 mL | Freq: Two times a day (BID) | INTRAMUSCULAR | Status: DC
  Administered 2015-11-19 – 2015-11-22 (×4): 3 mL via INTRAVENOUS

## 2015-11-19 MED ORDER — NICOTINE 14 MG/24HR TD PT24
14.0000 mg | MEDICATED_PATCH | Freq: Every day | TRANSDERMAL | Status: DC
  Administered 2015-11-19 – 2015-11-22 (×4): 14 mg via TRANSDERMAL
  Filled 2015-11-19 (×5): qty 1

## 2015-11-19 MED ORDER — SODIUM CHLORIDE 0.9 % IJ SOLN
3.0000 mL | Freq: Two times a day (BID) | INTRAMUSCULAR | Status: DC
  Administered 2015-11-19 – 2015-11-22 (×6): 3 mL via INTRAVENOUS

## 2015-11-19 MED ORDER — MAGNESIUM SULFATE 2 GM/50ML IV SOLN
2.0000 g | Freq: Once | INTRAVENOUS | Status: AC
  Administered 2015-11-19: 2 g via INTRAVENOUS
  Filled 2015-11-19: qty 50

## 2015-11-19 MED ORDER — ALPRAZOLAM 1 MG PO TABS
1.0000 mg | ORAL_TABLET | Freq: Three times a day (TID) | ORAL | Status: DC | PRN
  Administered 2015-11-19 – 2015-11-20 (×2): 1 mg via ORAL
  Filled 2015-11-19 (×2): qty 1

## 2015-11-19 MED ORDER — ACETAMINOPHEN 325 MG PO TABS
650.0000 mg | ORAL_TABLET | Freq: Four times a day (QID) | ORAL | Status: DC | PRN
  Administered 2015-11-20 – 2015-11-22 (×3): 650 mg via ORAL
  Filled 2015-11-19 (×3): qty 2

## 2015-11-19 MED ORDER — SODIUM CHLORIDE 0.9 % IV SOLN
1000.0000 mL | INTRAVENOUS | Status: DC
  Administered 2015-11-19: 1000 mL via INTRAVENOUS

## 2015-11-19 MED ORDER — METHYLPREDNISOLONE SODIUM SUCC 125 MG IJ SOLR
125.0000 mg | Freq: Once | INTRAMUSCULAR | Status: AC
  Administered 2015-11-19: 125 mg via INTRAVENOUS
  Filled 2015-11-19: qty 2

## 2015-11-19 MED ORDER — DEXTROSE 5 % IV SOLN
500.0000 mg | INTRAVENOUS | Status: DC
  Filled 2015-11-19 (×2): qty 500

## 2015-11-19 MED ORDER — NAPROXEN SODIUM 220 MG PO TABS
220.0000 mg | ORAL_TABLET | Freq: Two times a day (BID) | ORAL | Status: DC | PRN
  Filled 2015-11-19: qty 1

## 2015-11-19 MED ORDER — PAROXETINE HCL 20 MG PO TABS
20.0000 mg | ORAL_TABLET | Freq: Every evening | ORAL | Status: DC
  Administered 2015-11-19: 20 mg via ORAL
  Filled 2015-11-19: qty 1

## 2015-11-19 MED ORDER — ACETAMINOPHEN 650 MG RE SUPP
650.0000 mg | Freq: Four times a day (QID) | RECTAL | Status: DC | PRN
Start: 2015-11-19 — End: 2015-11-22

## 2015-11-19 MED ORDER — ENOXAPARIN SODIUM 40 MG/0.4ML ~~LOC~~ SOLN
40.0000 mg | SUBCUTANEOUS | Status: DC
  Administered 2015-11-20: 40 mg via SUBCUTANEOUS
  Filled 2015-11-19 (×2): qty 0.4

## 2015-11-19 MED ORDER — DEXTROSE 5 % IV SOLN
500.0000 mg | Freq: Once | INTRAVENOUS | Status: AC
  Administered 2015-11-19: 500 mg via INTRAVENOUS
  Filled 2015-11-19: qty 500

## 2015-11-19 MED ORDER — SODIUM CHLORIDE 0.9 % IV SOLN: 250.0000 mL | INTRAVENOUS | Status: DC | PRN

## 2015-11-19 MED ORDER — ONDANSETRON HCL 4 MG PO TABS: 4.0000 mg | ORAL_TABLET | Freq: Four times a day (QID) | ORAL | Status: DC | PRN

## 2015-11-19 MED ORDER — ONDANSETRON HCL 4 MG/2ML IJ SOLN: 4.0000 mg | Freq: Four times a day (QID) | INTRAMUSCULAR | Status: DC | PRN

## 2015-11-19 MED ORDER — SODIUM CHLORIDE 0.9 % IJ SOLN
3.0000 mL | INTRAMUSCULAR | Status: DC | PRN
  Administered 2015-11-22: 3 mL via INTRAVENOUS
  Filled 2015-11-19: qty 3

## 2015-11-19 MED ORDER — IPRATROPIUM-ALBUTEROL 0.5-2.5 (3) MG/3ML IN SOLN: 3.0000 mL | RESPIRATORY_TRACT | Status: DC | PRN

## 2015-11-19 MED ORDER — ONDANSETRON HCL 4 MG/2ML IJ SOLN
4.0000 mg | Freq: Once | INTRAMUSCULAR | Status: AC
  Administered 2015-11-19: 4 mg via INTRAVENOUS
  Filled 2015-11-19: qty 2

## 2015-11-19 MED ORDER — HYDROCODONE-ACETAMINOPHEN 5-325 MG PO TABS: 1.0000 | ORAL_TABLET | ORAL | Status: DC | PRN

## 2015-11-19 MED ORDER — IPRATROPIUM-ALBUTEROL 0.5-2.5 (3) MG/3ML IN SOLN
3.0000 mL | Freq: Four times a day (QID) | RESPIRATORY_TRACT | Status: DC
  Administered 2015-11-19 – 2015-11-20 (×3): 3 mL via RESPIRATORY_TRACT
  Filled 2015-11-19 (×3): qty 3

## 2015-11-19 MED ORDER — SORBITOL 70 % SOLN: 30.0000 mL | Freq: Every day | Status: DC | PRN

## 2015-11-19 MED ORDER — SODIUM CHLORIDE 0.9 % IV SOLN
1000.0000 mL | Freq: Once | INTRAVENOUS | Status: AC
  Administered 2015-11-19: 1000 mL via INTRAVENOUS

## 2015-11-19 MED ORDER — IPRATROPIUM-ALBUTEROL 0.5-2.5 (3) MG/3ML IN SOLN
3.0000 mL | Freq: Once | RESPIRATORY_TRACT | Status: AC
  Administered 2015-11-19: 3 mL via RESPIRATORY_TRACT
  Filled 2015-11-19: qty 3

## 2015-11-19 MED ORDER — POLYETHYLENE GLYCOL 3350 17 G PO PACK: 17.0000 g | PACK | Freq: Every day | ORAL | Status: DC | PRN

## 2015-11-19 NOTE — ED Notes (Signed)
Ambulated patient in hallway. Patient maintained 91% on first pass around nurse station then dropped to 89% on second pass with some shortness of breath.

## 2015-11-19 NOTE — ED Provider Notes (Signed)
CSN: [id number]     Arrival date & time 11/19/15  1341 History   First MD Initiated Contact with Patient 11/19/15 1346     Chief Complaint  Patient presents with  . Chest Pain     (Consider location/radiation/quality/duration/timing/severity/associated sxs/prior Treatment) Patient is a 54 y.o. female presenting with shortness of breath.  Shortness of Breath Severity:  Moderate Onset quality:  Gradual Duration:  2 days Timing:  Constant Progression:  Worsening Chronicity:  Recurrent Context: not activity and not fumes   Ineffective treatments:  Inhaler Associated symptoms: abdominal pain (when coughing), chest pain, cough and wheezing   Associated symptoms: no neck pain and no vomiting   Risk factors: tobacco use   Risk factors: no recent alcohol use     Past Medical History  Diagnosis Date  . COPD (chronic obstructive pulmonary disease) (HCC)   . Anxiety    Past Surgical History  Procedure Laterality Date  . Abdominal hysterectomy     Family History  Problem Relation Age of Onset  . COPD Mother   . COPD Father   . Diabetes type II Sister   . Diabetes type II Brother    Social History  Substance Use Topics  . Smoking status: Current Every Day Smoker -- 1.00 packs/day for 37 years    Types: Cigarettes  . Smokeless tobacco: None  . Alcohol Use: No   OB History    No data available     Review of Systems  Constitutional: Positive for chills.  HENT: Negative for congestion and drooling.   Eyes: Negative for pain.  Respiratory: Positive for cough, shortness of breath and wheezing.   Cardiovascular: Positive for chest pain. Negative for palpitations and leg swelling.  Gastrointestinal: Positive for abdominal pain (when coughing). Negative for nausea and vomiting.  Endocrine: Negative for polydipsia and polyuria.  Genitourinary: Negative for hematuria.  Musculoskeletal: Negative for back pain, neck pain and neck stiffness.  Neurological: Negative for dizziness,  seizures and syncope.  All other systems reviewed and are negative.     Allergies  Review of patient's allergies indicates no known allergies.  Home Medications   Prior to Admission medications   Medication Sig Start Date End Date Taking? Authorizing Provider  ALPRAZolam Prudy Feeler) 1 MG tablet Take 1 mg by mouth 3 (three) times daily as needed for anxiety.    Yes Historical Provider, MD  Melatonin 10 MG CAPS Take 1 capsule by mouth at bedtime as needed (for sleep).   Yes Historical Provider, MD  naproxen sodium (ALEVE) 220 MG tablet Take 22-440 mg by mouth daily as needed (for pain).   Yes Historical Provider, MD  PARoxetine (PAXIL) 20 MG tablet Take 20 mg by mouth every evening.    Yes Historical Provider, MD  azithromycin (ZITHROMAX) 250 MG tablet Take 1 tablet (250 mg total) by mouth daily. Take first 2 tablets together, then 1 every day until finished. Patient not taking: Reported on 11/19/2015 07/22/15   Courteney Lyn Mackuen, MD  cyclobenzaprine (FLEXERIL) 5 MG tablet Take 1 tablet (5 mg total) by mouth 3 (three) times daily as needed for muscle spasms. Patient not taking: Reported on 07/22/2015 06/15/15   Burgess Amor, PA-C  ibuprofen (ADVIL,MOTRIN) 600 MG tablet Take 1 tablet (600 mg total) by mouth every 6 (six) hours as needed. Patient not taking: Reported on 07/22/2015 06/15/15   Burgess Amor, PA-C  predniSONE (DELTASONE) 20 MG tablet Day 1 and 2: Take 3 tabs  Day 3-5: Take 2 tabs.  Day  5-8: take 1 tab Patient not taking: Reported on 11/19/2015 07/22/15   Courteney Lyn Mackuen, MD   BP 100/58 mmHg  Pulse 85  Temp(Src) 98.3 F (36.8 C) (Oral)  Resp 22  Ht 5\' 5"  (1.651 m)  Wt 149 lb 11.2 oz (67.903 kg)  BMI 24.91 kg/m2  SpO2 97% Physical Exam  Constitutional: She is oriented to person, place, and time. She appears well-developed and well-nourished.  HENT:  Head: Normocephalic and atraumatic.  Eyes: Conjunctivae are normal. Pupils are equal, round, and reactive to light.  Neck: Normal  range of motion.  Cardiovascular: Regular rhythm.  Tachycardia present.   Pulmonary/Chest: No accessory muscle usage or stridor. Tachypnea noted. No respiratory distress. She has decreased breath sounds. She has wheezes (R>L).  Abdominal: Soft. She exhibits no distension. There is no rebound.  Musculoskeletal: Normal range of motion. She exhibits no edema or tenderness.  Neurological: She is alert and oriented to person, place, and time. No cranial nerve deficit.  Skin: Skin is warm and dry.  Nursing note and vitals reviewed.   ED Course  Procedures (including critical care time) Labs Review Labs Reviewed  CBC - Abnormal; Notable for the following:    WBC 14.3 (*)    All other components within normal limits  BASIC METABOLIC PANEL - Abnormal; Notable for the following:    Glucose, Bld 152 (*)    Calcium 8.6 (*)    All other components within normal limits  CBC WITH DIFFERENTIAL/PLATELET - Abnormal; Notable for the following:    Neutro Abs 9.0 (*)    All other components within normal limits  BASIC METABOLIC PANEL  TROPONIN I  PROCALCITONIN  URINALYSIS, ROUTINE W REFLEX MICROSCOPIC (NOT [facility])  INFLUENZA PANEL BY PCR (TYPE A & B, H1N1)    Imaging Review Dg Chest 2 View  11/19/2015  CLINICAL DATA:  SOB, cough, mid upper posterior back pain since yesterday EXAM: CHEST  2 VIEW COMPARISON:  07/22/2015 FINDINGS: Lateral view degraded by patient arm position. Mild hyperinflation. Numerous leads and wires project over the chest. Midline trachea. Normal heart size and mediastinal contours. No pleural effusion or pneumothorax. Diffuse peribronchial thickening. Clear lungs. IMPRESSION: 1.  No acute cardiopulmonary disease. 2. Mild peribronchial thickening which may relate to chronic bronchitis or smoking. Electronically Signed   By: Jeronimo GreavesKyle  Talbot M.D.   On: 11/19/2015 15:39   I have personally reviewed and evaluated these images and lab results as part of my medical decision-making.   EKG  Interpretation   Date/Time:  Saturday November 19 2015 13:49:55 EST Ventricular Rate:  104 PR Interval:  111 QRS Duration: 81 QT Interval:  336 QTC Calculation: 442 R Axis:   92 Text Interpretation:  Sinus tachycardia Borderline right axis deviation  Anteroseptal infarct, age indeterminate Confirmed by [facility] MD, Barbara CowerJASON  [phone]([id number]) on 11/19/2015 1:53:18 PM      MDM   Final diagnoses:  COPD exacerbation (HCC)  Acute respiratory failure with hypoxia (HCC)  Gaze palsy  Leukocytosis    Likely copd exacerbation, but also has a chest 'tightness' on top of the sharp coughing chest pain. Will eval with troponin rule out and symptomatic treatment.   Symptoms slightly improved with treatment however still tachypneic and gets hypoxic with ambulation and at rest. Antibiotics, steroids, albuterol already given. Discussed with medicine and will admit to floor for further management.     Marily MemosJason Cloteal Isaacson, MD 11/20/15 1034

## 2015-11-19 NOTE — H&P (Signed)
[facility] History and Physical  Alicia Sanchez ZHY:[id number] DOB: Jul 03, 1961 DOA: 11/19/2015  Referring physician: ED physician PCP: Selinda Flavin, MD  Specialists:   Chief Complaint: Subjective fever, chills, SOB, chest tightness  HPI: Alicia Sanchez is a 54 y.o. female with PMH of anxiety, depression, tobacco abuse, and COPD not on home O2 who presents to the ED with subjective fever, chills, coughing fits, and chest tightness of approximately 2 days duration. Alicia Sanchez reports being in her usual state of health until approximately 2 days ago when she developed subjective fevers and chills accompanied by a new cough productive of thick clear and yellow sputum. By the following day, she was becoming progressively short of breath and experiencing chest tightness. Shortness of breath was worse with exertion and improved only minimally with her home albuterol MDI. She denies chest pain, palpitations, or swelling. She denies headaches, nausea, or vomiting, but endorses some mild diarrhea and chronic dysuria. She reports adherence with Breo and uses albuterol as needed. She did receive the influenza shot this year and denies sick contacts. She has not been immobile, does not take hormones, has no active cancer, and no personal or family history of the VTE.  In ED, patient was found to be afebrile and normotensive with borderline tachycardia and O2 saturation in the 80s on room air. Chest x-ray demonstrated diffuse peribronchial thickening, but no evidence of acute cardiopulmonary disease. Initial blood work was notable for a leukocytosis to 14,000 but was otherwise unremarkable. The patient was bolused 1 L normal saline, given 2 g IV magnesium, 125 mg IV Solu-Medrol, and an empiric dose of azithromycin. She remained hemodynamically stable in the emergency department but developed desaturation and significant symptoms on attempts to ambulate and the hospitalists were called to admit.  Where does  patient live?   At home     Can patient participate in ADLs?  Yes         Review of Systems:   General: subjective fevers, chills, no sweats, weight change, poor appetite, or fatigue HEENT: no blurry vision, hearing changes or sore throat Pulm: Dyspneic with coughing fits CV: no chest pain or palpitations, positive chest tightness Abd: no nausea, vomiting, abdominal pain, or constipation, positive diarrhea, mild, non-bloody GU: no, hematuria, increased urinary frequency, or urgency. Positive chronic dysuria  Ext: no leg edema Neuro: no focal weakness, numbness, or tingling, no vision change or hearing loss Skin: no rash, no wounds MSK: No muscle spasm, no deformity, no red, hot, or swollen joint Heme: No easy bruising or bleeding Travel history: No recent long distant travel    Allergy: No Known Allergies  Past Medical History  Diagnosis Date  . COPD (chronic obstructive pulmonary disease) (HCC)   . Anxiety     Past Surgical History  Procedure Laterality Date  . Abdominal hysterectomy      Social History:  reports that she has been smoking Cigarettes.  She has a 37 pack-year smoking history. She does not have any smokeless tobacco history on file. She reports that she does not drink alcohol or use illicit drugs.  Family History:  Family History  Problem Relation Age of Onset  . COPD Mother   . COPD Father   . Diabetes type II Sister   . Diabetes type II Brother      Prior to Admission medications   Medication Sig Start Date End Date Taking? Authorizing Provider  ALPRAZolam Prudy Feeler) 1 MG tablet Take 1 mg by mouth 3 (three) times daily as  needed for anxiety.    Yes Historical Provider, MD  Melatonin 10 MG CAPS Take 1 capsule by mouth at bedtime as needed (for sleep).   Yes Historical Provider, MD  naproxen sodium (ALEVE) 220 MG tablet Take 22-440 mg by mouth daily as needed (for pain).   Yes Historical Provider, MD  PARoxetine (PAXIL) 20 MG tablet Take 20 mg by mouth every  evening.    Yes Historical Provider, MD  azithromycin (ZITHROMAX) 250 MG tablet Take 1 tablet (250 mg total) by mouth daily. Take first 2 tablets together, then 1 every day until finished. Patient not taking: Reported on 11/19/2015 07/22/15   Courteney Lyn Mackuen, MD  cyclobenzaprine (FLEXERIL) 5 MG tablet Take 1 tablet (5 mg total) by mouth 3 (three) times daily as needed for muscle spasms. Patient not taking: Reported on 07/22/2015 06/15/15   Burgess AmorJulie Idol, PA-C  ibuprofen (ADVIL,MOTRIN) 600 MG tablet Take 1 tablet (600 mg total) by mouth every 6 (six) hours as needed. Patient not taking: Reported on 07/22/2015 06/15/15   Burgess AmorJulie Idol, PA-C  predniSONE (DELTASONE) 20 MG tablet Day 1 and 2: Take 3 tabs  Day 3-5: Take 2 tabs.  Day 5-8: take 1 tab Patient not taking: Reported on 11/19/2015 07/22/15   Courteney Lyn Mackuen, MD    Physical Exam: Filed Vitals:   11/19/15 1630 11/19/15 1645 11/19/15 1659 11/19/15 1701  BP: 103/67   93/58  Pulse: 97 102  93  Temp:      TempSrc:      Resp: 22 24  24   Height:      Weight:      SpO2: 90% 88% 94% 94%   General: Mild respiratory distress with tachypnea and use of neck muscles with respirations HEENT:       Eyes: Right eye deviation noted. PERRL, no scleral icterus or conjunctival pallor.       ENT: No discharge from the ears or nose, no pharyngeal ulcers, petechiae or exudate, no tonsillar enlargement.        Neck: No JVD, no bruit, no appreciable mass Heme: No cervical adenopathy, no pallor Cardiac: Rate approx 110 and regular, No murmurs, No gallops or rubs. Pulm: Symmetric chest wall expansion, no ronchi. Borderline tachypnea, using neck muscles, breath sounds diminished b/l with expiratory wheezes. Abd: Soft, nondistended, nontender, no rebound pain or gaurding, no mass or organomegaly, BS present. Ext: No LE edema bilaterally. 2+DP/PT pulse bilaterally. Musculoskeletal: No gross deformity, no red, hot, swollen joints, no limitation in ROM  Skin: No  rashes or wounds on exposed surfaces  Neuro: Alert, oriented X3, right eye deviates out, cranial nerves II-XII grossly intact otherwise without focal findings Psych: Patient is not overtly psychotic, denies suicidal or homocidal ideation, no active hallucinations.  Labs on Admission:  Basic Metabolic Panel:  Recent Labs Lab 11/19/15 1423  NA 138  K 3.9  CL 104  CO2 26  GLUCOSE 98  BUN 8  CREATININE 0.79  CALCIUM 9.1   Liver Function Tests: No results for input(s): AST, ALT, ALKPHOS, BILITOT, PROT, ALBUMIN in the last 168 hours. No results for input(s): LIPASE, AMYLASE in the last 168 hours. No results for input(s): AMMONIA in the last 168 hours. CBC:  Recent Labs Lab 11/19/15 1423  WBC 14.3*  HGB 14.3  HCT 41.3  MCV 88.2  PLT 272   Cardiac Enzymes:  Recent Labs Lab 11/19/15 1423  TROPONINI <0.03    BNP (last 3 results) No results for input(s): BNP in the  last 8760 hours.  ProBNP (last 3 results) No results for input(s): PROBNP in the last 8760 hours.  CBG: No results for input(s): GLUCAP in the last 168 hours.  Radiological Exams on Admission: Dg Chest 2 View  11/19/2015  CLINICAL DATA:  SOB, cough, mid upper posterior back pain since yesterday EXAM: CHEST  2 VIEW COMPARISON:  07/22/2015 FINDINGS: Lateral view degraded by patient arm position. Mild hyperinflation. Numerous leads and wires project over the chest. Midline trachea. Normal heart size and mediastinal contours. No pleural effusion or pneumothorax. Diffuse peribronchial thickening. Clear lungs. IMPRESSION: 1.  No acute cardiopulmonary disease. 2. Mild peribronchial thickening which may relate to chronic bronchitis or smoking. Electronically Signed   By: Jeronimo Greaves M.D.   On: 11/19/2015 15:39    EKG: Independently reviewed.  Abnormal findings:   Sinus tachycardia, borderline RAD  Assessment/Plan  1. COPD with acute exacerbation - Suspect infectious trigger  - Continue supplemental O2, titrated  for sat 90-94%  - Systemic steroids, Solu-Medrol 60 mg q6h IV, taper as clinically appropriate  - DuoNebs scheduled q6h, also available q2h prn  - Monitor on tele, continuous pulse ox  - Covering atypical organisms with azithromycin    2. Leukocytosis  - Suspect infectious trigger to #1 - No focal consolidation on CXR and afebrile here  - Suspect LRTI with viral vs atypical organsism  - Covering with azithromycin  - Check procalcitonin   3. Tobacco abuse - Counseled pt regarding importance of cessation - Commended pt for attempts at cutting back, encouraged to continue  - RN to give smoking cessation info prior to discharge   4. Depression, anxiety  - Appears stable  - Continue home Paxil, prn Xanax    DVT ppx:  SQ Lovenox   Code Status: Full code Family Communication:  Yes, patient's husband at bed side Disposition Plan: Admit to observation   Date of Service 11/19/2015    Briscoe Deutscher, MD [facility] Pager [phone]  If 7PM-7AM, please contact night-coverage [email or web address] Password [email or web address] 11/19/2015, 5:50 PM

## 2015-11-19 NOTE — ED Notes (Signed)
Patient states chest pain that started last night with tightness to left chest that comes and goes. Denies N/V/D. History of COPD.

## 2015-11-20 ENCOUNTER — Observation Stay (HOSPITAL_COMMUNITY): Payer: Self-pay

## 2015-11-20 LAB — CBC WITH DIFFERENTIAL/PLATELET
BASOS ABS: 0 10*3/uL (ref 0.0–0.1)
BASOS PCT: 0 %
Eosinophils Absolute: 0 10*3/uL (ref 0.0–0.7)
Eosinophils Relative: 0 %
HEMATOCRIT: 39.4 % (ref 36.0–46.0)
HEMOGLOBIN: 13.4 g/dL (ref 12.0–15.0)
Lymphocytes Relative: 8 %
Lymphs Abs: 0.8 10*3/uL (ref 0.7–4.0)
MCH: 30.1 pg (ref 26.0–34.0)
MCHC: 34 g/dL (ref 30.0–36.0)
MCV: 88.5 fL (ref 78.0–100.0)
Monocytes Absolute: 0.1 10*3/uL (ref 0.1–1.0)
Monocytes Relative: 1 %
NEUTROS ABS: 9 10*3/uL — AB (ref 1.7–7.7)
NEUTROS PCT: 91 %
Platelets: 292 10*3/uL (ref 150–400)
RBC: 4.45 MIL/uL (ref 3.87–5.11)
RDW: 12.1 % (ref 11.5–15.5)
WBC: 10 10*3/uL (ref 4.0–10.5)

## 2015-11-20 LAB — URINALYSIS, ROUTINE W REFLEX MICROSCOPIC
Bilirubin Urine: NEGATIVE
Glucose, UA: NEGATIVE mg/dL
KETONES UR: NEGATIVE mg/dL
LEUKOCYTES UA: NEGATIVE
NITRITE: NEGATIVE
PH: 5.5 (ref 5.0–8.0)
Protein, ur: NEGATIVE mg/dL

## 2015-11-20 LAB — BASIC METABOLIC PANEL
ANION GAP: 10 (ref 5–15)
BUN: 14 mg/dL (ref 6–20)
CALCIUM: 8.6 mg/dL — AB (ref 8.9–10.3)
CO2: 23 mmol/L (ref 22–32)
Chloride: 106 mmol/L (ref 101–111)
Creatinine, Ser: 0.72 mg/dL (ref 0.44–1.00)
Glucose, Bld: 152 mg/dL — ABNORMAL HIGH (ref 65–99)
Potassium: 4 mmol/L (ref 3.5–5.1)
SODIUM: 139 mmol/L (ref 135–145)

## 2015-11-20 LAB — URINE MICROSCOPIC-ADD ON
Bacteria, UA: NONE SEEN
SQUAMOUS EPITHELIAL / LPF: NONE SEEN
WBC UA: NONE SEEN WBC/hpf (ref 0–5)

## 2015-11-20 LAB — INFLUENZA PANEL BY PCR (TYPE A & B)
H1N1 flu by pcr: NOT DETECTED
INFLAPCR: NEGATIVE
INFLBPCR: NEGATIVE

## 2015-11-20 MED ORDER — LEVALBUTEROL HCL 0.63 MG/3ML IN NEBU
0.6300 mg | INHALATION_SOLUTION | Freq: Four times a day (QID) | RESPIRATORY_TRACT | Status: DC
  Administered 2015-11-20 – 2015-11-21 (×5): 0.63 mg via RESPIRATORY_TRACT
  Filled 2015-11-20 (×5): qty 3

## 2015-11-20 MED ORDER — NAPROXEN 250 MG PO TABS: 250.0000 mg | ORAL_TABLET | Freq: Two times a day (BID) | ORAL | Status: DC | PRN

## 2015-11-20 MED ORDER — IPRATROPIUM-ALBUTEROL 0.5-2.5 (3) MG/3ML IN SOLN
3.0000 mL | Freq: Four times a day (QID) | RESPIRATORY_TRACT | Status: DC
  Administered 2015-11-20: 3 mL via RESPIRATORY_TRACT
  Filled 2015-11-20: qty 3

## 2015-11-20 MED ORDER — IPRATROPIUM BROMIDE 0.02 % IN SOLN
0.5000 mg | Freq: Four times a day (QID) | RESPIRATORY_TRACT | Status: DC
  Administered 2015-11-20 – 2015-11-21 (×5): 0.5 mg via RESPIRATORY_TRACT
  Filled 2015-11-20 (×5): qty 2.5

## 2015-11-20 MED ORDER — LEVOFLOXACIN IN D5W 750 MG/150ML IV SOLN
750.0000 mg | INTRAVENOUS | Status: DC
  Administered 2015-11-20 – 2015-11-22 (×3): 750 mg via INTRAVENOUS
  Filled 2015-11-20 (×3): qty 150

## 2015-11-20 MED ORDER — GUAIFENESIN ER 600 MG PO TB12
1200.0000 mg | ORAL_TABLET | Freq: Two times a day (BID) | ORAL | Status: DC
  Administered 2015-11-20 – 2015-11-22 (×5): 1200 mg via ORAL
  Filled 2015-11-20 (×5): qty 2

## 2015-11-20 MED ORDER — SODIUM CHLORIDE 0.9 % IV SOLN: 250.0000 mL | INTRAVENOUS | Status: DC | PRN

## 2015-11-20 MED ORDER — PAROXETINE HCL 20 MG PO TABS
20.0000 mg | ORAL_TABLET | Freq: Every morning | ORAL | Status: DC
  Administered 2015-11-20 – 2015-11-22 (×3): 20 mg via ORAL
  Filled 2015-11-20 (×3): qty 1

## 2015-11-20 MED ORDER — ZOLPIDEM TARTRATE 5 MG PO TABS
5.0000 mg | ORAL_TABLET | Freq: Every day | ORAL | Status: DC
  Administered 2015-11-20 – 2015-11-21 (×2): 5 mg via ORAL
  Filled 2015-11-20 (×2): qty 1

## 2015-11-20 MED ORDER — IPRATROPIUM-ALBUTEROL 0.5-2.5 (3) MG/3ML IN SOLN: 3.0000 mL | RESPIRATORY_TRACT | Status: DC | PRN

## 2015-11-20 NOTE — Progress Notes (Signed)
ANTIBIOTIC CONSULT NOTE - INITIAL  Pharmacy Consult for Levaquin Indication: pneumonia  No Known Allergies  Patient Measurements: Height: 5\' 5"  (165.1 cm) Weight: 149 lb 11.2 oz (67.903 kg) IBW/kg (Calculated) : 57   Vital Signs: Temp: 98.3 F (36.8 C) (01/01 0535) Temp Source: Oral (01/01 0535) BP: 100/58 mmHg (01/01 0535) Pulse Rate: 85 (01/01 0845) Intake/Output from previous day: 12/31 0701 - 01/01 0700 In: -  Out: 400 [Urine:400] Intake/Output from this shift:    Labs:  Recent Labs  11/19/15 1423 11/20/15 0620  WBC 14.3* 10.0  HGB 14.3 13.4  PLT 272 292  CREATININE 0.79 0.72   Estimated Creatinine Clearance: 71.5 mL/min (by C-G formula based on Cr of 0.72). No results for input(s): VANCOTROUGH, VANCOPEAK, VANCORANDOM, GENTTROUGH, GENTPEAK, GENTRANDOM, TOBRATROUGH, TOBRAPEAK, TOBRARND, AMIKACINPEAK, AMIKACINTROU, AMIKACIN in the last 72 hours.   Microbiology: No results found for this or any previous visit (from the past 720 hour(s)).  Medical History: Past Medical History  Diagnosis Date  . COPD (chronic obstructive pulmonary disease) (HCC)   . Anxiety     Medications:  Prescriptions prior to admission  Medication Sig Dispense Refill Last Dose  . ALPRAZolam (XANAX) 1 MG tablet Take 1 mg by mouth 3 (three) times daily as needed for anxiety.    Past Week at Unknown time  . Melatonin 10 MG CAPS Take 1 capsule by mouth at bedtime as needed (for sleep).   11/18/2015 at Unknown time  . naproxen sodium (ALEVE) 220 MG tablet Take 22-440 mg by mouth daily as needed (for pain).   Past Week at Unknown time  . PARoxetine (PAXIL) 20 MG tablet Take 20 mg by mouth every evening.    11/18/2015 at Unknown time  . azithromycin (ZITHROMAX) 250 MG tablet Take 1 tablet (250 mg total) by mouth daily. Take first 2 tablets together, then 1 every day until finished. (Patient not taking: Reported on 11/19/2015) 4 tablet 0 Completed Course at Unknown time  . cyclobenzaprine  (FLEXERIL) 5 MG tablet Take 1 tablet (5 mg total) by mouth 3 (three) times daily as needed for muscle spasms. (Patient not taking: Reported on 07/22/2015) 20 tablet 0 Not Taking at Unknown time  . ibuprofen (ADVIL,MOTRIN) 600 MG tablet Take 1 tablet (600 mg total) by mouth every 6 (six) hours as needed. (Patient not taking: Reported on 07/22/2015) 30 tablet 0 Not Taking at Unknown time  . predniSONE (DELTASONE) 20 MG tablet Day 1 and 2: Take 3 tabs  Day 3-5: Take 2 tabs.  Day 5-8: take 1 tab (Patient not taking: Reported on 11/19/2015) 16 tablet 0 Completed Course at Unknown time   Assessment: 55 yo female with COPD exacerbation presnted to ED with subjective fever, chills, coughing, and chest tightness. Chest xray clear lungs.   Goal of Therapy:  resolution of infection  Plan:  Levaquin 750mg  IV q24h Follow up culture results  Monitor V/S and labs  Elder CyphersLorie Aayana Reinertsen, BS Loura BackPharm D, BCPS Clinical Pharmacist Pager [phone] 11/20/2015,9:39 AM

## 2015-11-20 NOTE — Progress Notes (Signed)
[facility] PROGRESS NOTE  Alicia Sanchez VFI:[id number] DOB: 1961-07-01 DOA: 11/19/2015 PCP: Selinda Flavin, MD  Assessment/Plan:  COPD exacerbation- slowly improving, patient started on Solu-Medrol, DuoNeb's, will change antibiotics to Levaquin.  Leukocytosis/hypotension- resolved, patient still hypotensive. Concern for infectious source. Will repeat chest x-ray to rule out underlying pneumonia and check UA. Change antibiotic to IV Levaquin. Start IV fluids normal saline at 125 mL per hour.  Headache-patient has chronic ongoing headache, also complains of headache waking her up from sleep. Intermittent nausea and vomiting. Will check CT head to rule out underlying neurologic abnormality.  Anxiety/depression- patient is on Paxil 20 daily gram by mouth daily. Patient gets restless sleep when she takes Paxil at bedtime. Will change Paxil to morning dose.   Tobacco abuse- continue nicotine patch. Patient counseled by admitting physician    Code Status: Full code Family Communication: No family present at bedside Disposition Plan: Pending improvement in respiratory status   Consultants:  None  Procedures:  None  Antibiotics:  Zithromax- 11/19/2015 to 11/20/2015  Levaquin-11/20/2015  HPI/Subjective: 55 y.o. female with PMH of anxiety, depression, tobacco abuse, and COPD not on home O2 who presents to the ED with subjective fever, chills, coughing fits, and chest tightness of approximately 2 days duration. Ms. Alicia Sanchez reports being in her usual state of health until approximately 2 days ago when she developed subjective fevers and chills accompanied by a new cough productive of thick clear and yellow sputum  Patient still has shortness of breath on exertion, coughing up phlegm. Patient was hypotensive yesterday was given fluid bolus 1 L normal saline 1. Blood pressure is still soft. White count down to 10,000. Patient also complains of ongoing headache. Also complains of  intermittent nausea and vomiting.  Objective: Filed Vitals:   11/20/15 0535 11/20/15 0845  BP: 100/58   Pulse: 76 85  Temp: 98.3 F (36.8 C)   Resp: 22     Intake/Output Summary (Last 24 hours) at 11/20/15 0912 Last data filed at 11/20/15 0200  Gross per 24 hour  Intake      0 ml  Output    400 ml  Net   -400 ml   Filed Weights   11/19/15 1904 11/19/15 1925 11/20/15 0535  Weight: 67.994 kg (149 lb 14.4 oz) 67.994 kg (149 lb 14.4 oz) 67.903 kg (149 lb 11.2 oz)    Exam:   General:  Appears in no acute distress  Cardiovascular: S1-S2 is regular  Respiratory: Clear to auscultation bilaterally  Abdomen: Soft, nontender, no organomegaly  Musculoskeletal: No cyanosis/tubing/edema of the lower extremities   Data Reviewed: Basic Metabolic Panel:  Recent Labs Lab 11/19/15 1423 11/20/15 0620  NA 138 139  K 3.9 4.0  CL 104 106  CO2 26 23  GLUCOSE 98 152*  BUN 8 14  CREATININE 0.79 0.72  CALCIUM 9.1 8.6*   CBC:  Recent Labs Lab 11/19/15 1423 11/20/15 0620  WBC 14.3* 10.0  NEUTROABS  --  9.0*  HGB 14.3 13.4  HCT 41.3 39.4  MCV 88.2 88.5  PLT 272 292   Cardiac Enzymes:  Recent Labs Lab 11/19/15 1423  TROPONINI <0.03    Studies: Dg Chest 2 View  11/19/2015  CLINICAL DATA:  SOB, cough, mid upper posterior back pain since yesterday EXAM: CHEST  2 VIEW COMPARISON:  07/22/2015 FINDINGS: Lateral view degraded by patient arm position. Mild hyperinflation. Numerous leads and wires project over the chest. Midline trachea. Normal heart size and mediastinal contours. No pleural effusion or pneumothorax.  Diffuse peribronchial thickening. Clear lungs. IMPRESSION: 1.  No acute cardiopulmonary disease. 2. Mild peribronchial thickening which may relate to chronic bronchitis or smoking. Electronically Signed   By: Jeronimo GreavesKyle  Talbot M.D.   On: 11/19/2015 15:39    Scheduled Meds: . enoxaparin (LOVENOX) injection  40 mg Subcutaneous Q24H  . guaiFENesin  1,200 mg Oral BID  .  ipratropium-albuterol  3 mL Nebulization Q6H  . methylPREDNISolone (SOLU-MEDROL) injection  60 mg Intravenous Q6H  . nicotine  14 mg Transdermal Daily  . PARoxetine  20 mg Oral QPM  . sodium chloride  3 mL Intravenous Q12H  . sodium chloride  3 mL Intravenous Q12H  . zolpidem  5 mg Oral QHS   Continuous Infusions:   Principal Problem:   COPD exacerbation (HCC) Active Problems:   Acute URI   Leukocytosis   Tobacco abuse   Depression with anxiety    Time spent: 25 min    [facility]AMA,Robyn Nohr S  [facility] Pager [phone]. If 7PM-7AM, please contact night-coverage at [email or web address], password [email or web address] 11/20/2015, 9:12 AM

## 2015-11-20 NOTE — Progress Notes (Signed)
Called by RN that patient had an episode of SVT for minute and a half on telemetry, patient was only short of breath. She's back to normal sinus rhythm. Will check echocardiogram, serum magnesium. Change albuterol to Xopenex

## 2015-11-20 NOTE — Progress Notes (Signed)
Utilization Review Completed.Alicia Sanchez T1/11/2015  

## 2015-11-20 NOTE — Progress Notes (Signed)
Pt is stable. MD aware and notified of episode of SVT. Orders place and RN will continue to monitor. Lesly Dukesachel J Everett, RN

## 2015-11-20 NOTE — Progress Notes (Signed)
Telemetry called and stated pt was in SVT for minute and a half with HR ranging from 130-170. Pt complained of being SOB.  Pt then got up and had another episode of SVT in the 130's. MD notified/aware. PT is stable and in bed. Lesly Dukesachel J Everett, RN

## 2015-11-21 ENCOUNTER — Inpatient Hospital Stay (HOSPITAL_COMMUNITY): Payer: Self-pay

## 2015-11-21 DIAGNOSIS — I471 Supraventricular tachycardia: Secondary | ICD-10-CM

## 2015-11-21 DIAGNOSIS — R0600 Dyspnea, unspecified: Secondary | ICD-10-CM

## 2015-11-21 DIAGNOSIS — I959 Hypotension, unspecified: Secondary | ICD-10-CM

## 2015-11-21 LAB — COMPREHENSIVE METABOLIC PANEL
ALK PHOS: 61 U/L (ref 38–126)
ALT: 18 U/L (ref 14–54)
ANION GAP: 8 (ref 5–15)
AST: 19 U/L (ref 15–41)
Albumin: 3.1 g/dL — ABNORMAL LOW (ref 3.5–5.0)
BILIRUBIN TOTAL: 0.3 mg/dL (ref 0.3–1.2)
BUN: 15 mg/dL (ref 6–20)
CALCIUM: 8.5 mg/dL — AB (ref 8.9–10.3)
CO2: 24 mmol/L (ref 22–32)
Chloride: 109 mmol/L (ref 101–111)
Creatinine, Ser: 0.73 mg/dL (ref 0.44–1.00)
Glucose, Bld: 200 mg/dL — ABNORMAL HIGH (ref 65–99)
POTASSIUM: 3.8 mmol/L (ref 3.5–5.1)
Sodium: 141 mmol/L (ref 135–145)
TOTAL PROTEIN: 5.8 g/dL — AB (ref 6.5–8.1)

## 2015-11-21 LAB — MAGNESIUM: MAGNESIUM: 1.8 mg/dL (ref 1.7–2.4)

## 2015-11-21 LAB — CBC
HEMATOCRIT: 36.8 % (ref 36.0–46.0)
HEMOGLOBIN: 12.3 g/dL (ref 12.0–15.0)
MCH: 29.9 pg (ref 26.0–34.0)
MCHC: 33.4 g/dL (ref 30.0–36.0)
MCV: 89.3 fL (ref 78.0–100.0)
Platelets: 311 10*3/uL (ref 150–400)
RBC: 4.12 MIL/uL (ref 3.87–5.11)
RDW: 12.4 % (ref 11.5–15.5)
WBC: 21.1 10*3/uL — ABNORMAL HIGH (ref 4.0–10.5)

## 2015-11-21 LAB — PROCALCITONIN

## 2015-11-21 MED ORDER — LEVALBUTEROL HCL 0.63 MG/3ML IN NEBU
0.6300 mg | INHALATION_SOLUTION | Freq: Two times a day (BID) | RESPIRATORY_TRACT | Status: DC
  Administered 2015-11-22: 0.63 mg via RESPIRATORY_TRACT
  Filled 2015-11-21: qty 3

## 2015-11-21 MED ORDER — LEVALBUTEROL HCL 0.63 MG/3ML IN NEBU: 0.6300 mg | INHALATION_SOLUTION | Freq: Four times a day (QID) | RESPIRATORY_TRACT | Status: DC | PRN

## 2015-11-21 MED ORDER — IPRATROPIUM BROMIDE 0.02 % IN SOLN
0.5000 mg | Freq: Two times a day (BID) | RESPIRATORY_TRACT | Status: DC
  Administered 2015-11-22: 0.5 mg via RESPIRATORY_TRACT
  Filled 2015-11-21 (×2): qty 2.5

## 2015-11-21 NOTE — Progress Notes (Signed)
PT Cancellation Note  Patient Details Name: Alicia Sanchez MRN: [id number] DOB: 01/28/1961   Cancelled Treatment:    Reason Eval/Treat Not Completed: PT screened, no needs identified, will sign off. Spoke to patient and husband, who is back on room air, denies SOB. Pt denies any major change to functional status, mobility, strength, balance, etc. She attests that she is at her baseline right now. No reason seen for PT evaluation at this time. Pt screened and signing off, RN is agreeable. Please reorder as need.    Buccola,Allan C 11/21/2015, 10:07 AM  10:09 AM  Rosamaria LintsAllan C Buccola, PT, DPT [place] License # [id number]

## 2015-11-21 NOTE — Progress Notes (Signed)
Initial Nutrition Assessment  DOCUMENTATION CODES:  Not applicable  INTERVENTION:  Food preference/meal requests  NUTRITION DIAGNOSIS:  Increased nutrient needs related to chronic illness (COPD) as evidenced by estimated nutritional requirements for this condition.  GOAL:  Patient will meet greater than or equal to 90% of their needs  MONITOR:  PO intake  REASON FOR ASSESSMENT:  Consult COPD Protocol  ASSESSMENT:  55 y/o female PMHx anxiety, depression, tobacco abuse, and COPD presents with fever, chills, coughing fits, and chest tightness of approximately 2 days duration. Admitted to observation for COPD exacerbation.   Pt reports that her appetite has been unchanged. She does not eat 3 meals a day, rather 1.5 meals. Pt says she has always "eaten like a bird" and never has had a great appetite.  She does not take any vitamins or minerals though admits she was advised to start taking a mvi. She does not drink Ensure/ Boost.   Reports having nausea every morning, but this is nothing new. She reports having diarrhea recently. She says she has not had a BM in a couple days. Again, this is normal, She says that if she has a BM every other day she is doing well.   Pt reports that her UBW is about where it is now. No wt changes recently.   NFPE WDL.   Diet Order:  Diet regular Room service appropriate?: Yes; Fluid consistency:: Thin  Skin:  Reviewed, no issues  Last BM:  1/1  Height:  Ht Readings from Last 1 Encounters:  11/21/15 5\' 5"  (1.651 m)   Weight:  Wt Readings from Last 1 Encounters:  11/21/15 152 lb 14.4 oz (69.355 kg)   Wt Readings from Last 10 Encounters:  11/21/15 152 lb 14.4 oz (69.355 kg)  07/22/15 151 lb 2 oz (68.55 kg)  06/15/15 153 lb (69.4 kg)  12/12/12 155 lb (70.308 kg)  Admit weight: 150 lbs  Ideal Body Weight:  56.82 kg  BMI:  Body mass index is 25 kg/(m^2).  Estimated Nutritional Needs:  Kcal:  1550-1750 (23-26 kcal/kg bw) Protein:  57-68 g  (1-1.2 g/kg IBW) Fluid:  1.6-1.8 liters fluid  EDUCATION NEEDS:  No education needs identified at this time  Christophe Louisathan Franks RD, LDN Nutrition Pager: [phone] 11/21/2015 1:36 PM

## 2015-11-21 NOTE — Evaluation (Signed)
Occupational Therapy Evaluation Patient Details Name: Alicia Sanchez MRN: [id number] DOB: 01/20/1961 Today's Date: 11/21/2015    History of Present Illness Alicia Sanchez is a 55 y.o. female with PMH of anxiety, depression, tobacco abuse, and COPD not on home O2 who presents to the ED with subjective fever, chills, coughing fits, and chest tightness of approximately 2 days duration. Ms. Alicia Sanchez reports being in her usual state of health until approximately 2 days ago when she developed subjective fevers and chills accompanied by a new cough productive of thick clear and yellow sputum. By the following day, she was becoming progressively short of breath and experiencing chest tightness. Shortness of breath was worse with exertion and improved only minimally with her home albuterol MDI. She denies chest pain, palpitations, or swelling. She denies headaches, nausea, or vomiting, but endorses some mild diarrhea and chronic dysuria. She reports adherence with Breo and uses albuterol as needed. She did receive the influenza shot this year and denies sick contacts. She has not been immobile, does not take hormones, has no active cancer, and no personal or family history of the VTE.   Clinical Impression   Pt awake, alert, oriented x3 this am, agreeable to evaluation. Pt demonstrates independence in all ADL and functional mobility tasks. She reports she is feeling better this am. Educated and discussed energy conservation strategies to use at home with pt. Pt reports taking frequent breaks at home due to fatigue. Pt is at functional baseline, no further OT services required at this time.     Follow Up Recommendations  No OT follow up    Equipment Recommendations  None recommended by OT       Precautions / Restrictions Precautions Precautions: None Restrictions Weight Bearing Restrictions: No      Mobility Bed Mobility Overal bed mobility: Independent                Transfers Overall  transfer level: Independent                         ADL Overall ADL's : Modified independent;At baseline     Grooming: Wash/dry hands;Independent;Standing               Lower Body Dressing: Independent;Sitting/lateral leans   Toilet Transfer: Independent   Toileting- Clothing Manipulation and Hygiene: Independent;Sit to/from stand       Functional mobility during ADLs: Independent                 Pertinent Vitals/Pain Pain Assessment: No/denies pain     Hand Dominance Right   Extremity/Trunk Assessment Upper Extremity Assessment Upper Extremity Assessment: Overall WFL for tasks assessed   Lower Extremity Assessment Lower Extremity Assessment: Defer to PT evaluation       Communication Communication Communication: No difficulties   Cognition Arousal/Alertness: Awake/alert Behavior During Therapy: WFL for tasks assessed/performed Overall Cognitive Status: Within Functional Limits for tasks assessed                                Home Living Family/patient expects to be discharged to:: Private residence Living Arrangements: Spouse/significant other Available Help at Discharge: Family               Bathroom Shower/Tub: Chief Strategy OfficerTub/shower unit   Bathroom Toilet: Standard     Home Equipment: None          Prior Functioning/Environment Level of Independence: Independent  End of Session    Activity Tolerance: Patient tolerated treatment well Patient left: in bed;with call bell/phone within reach   Time: 1610-9604 OT Time Calculation (min): 15 min Charges:  OT General Charges $OT Visit: 1 Procedure OT Evaluation $Initial OT Evaluation Tier I: 1 Procedure  Ezra Sites, OTR/L  [phone]  11/21/2015, 9:15 AM
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
## 2015-11-21 NOTE — Care Management Note (Addendum)
Case Management Note  Patient Details  Name: Lynnell JudeBillie Facundo MRN: [id number] Date of Birth: 12/11/1960  Subjective/Objective:                  Pt is from home, lives with her husband and is ind with ADL's. Pt has neb machine at home but no O2. Pt is ind with ADL's, no DME or HH services prior to admission. Pt is uninsured and is requesting FC referral to discuss Industrial/product designermedicaid qualifications. Pt has PCP. Pt plans to return home with self care at DC.   Action/Plan: No CM needs anticipated. FC referral made.   Expected Discharge Date:  11/20/15               Expected Discharge Plan:  Home/Self Care  In-House Referral:  NA  Discharge planning Services  CM Consult  Post Acute Care Choice:  NA Choice offered to:  NA  DME Arranged:    DME Agency:     HH Arranged:    HH Agency:     Status of Service:  In process, will continue to follow  Medicare Important Message Given:    Date Medicare IM Given:    Medicare IM give by:    Date Additional Medicare IM Given:    Additional Medicare Important Message give by:     If discussed at Long Length of Stay Meetings, dates discussed:    Additional Comments:  Malcolm MetroChildress, Eliska Hamil Demske, RN 11/21/2015, 2:55 PM

## 2015-11-22 DIAGNOSIS — J069 Acute upper respiratory infection, unspecified: Secondary | ICD-10-CM

## 2015-11-22 MED ORDER — PAROXETINE HCL 20 MG PO TABS: 20.0000 mg | ORAL_TABLET | Freq: Every morning | ORAL | Status: DC

## 2015-11-22 MED ORDER — TIOTROPIUM BROMIDE MONOHYDRATE 18 MCG IN CAPS: 18.0000 ug | ORAL_CAPSULE | Freq: Every day | RESPIRATORY_TRACT | Status: DC

## 2015-11-22 MED ORDER — NICOTINE 14 MG/24HR TD PT24: 14.0000 mg | MEDICATED_PATCH | Freq: Every day | TRANSDERMAL | Status: DC

## 2015-11-22 MED ORDER — LEVOFLOXACIN 500 MG PO TABS: 500.0000 mg | ORAL_TABLET | Freq: Every day | ORAL | Status: DC

## 2015-11-22 MED ORDER — PREDNISONE 10 MG PO TABS: 10.0000 mg | ORAL_TABLET | Freq: Every day | ORAL | Status: DC

## 2015-11-22 MED ORDER — ALBUTEROL SULFATE HFA 108 (90 BASE) MCG/ACT IN AERS: 2.0000 | INHALATION_SPRAY | Freq: Four times a day (QID) | RESPIRATORY_TRACT | Status: DC | PRN

## 2015-11-22 MED ORDER — ACETAMINOPHEN 325 MG PO TABS: 650.0000 mg | ORAL_TABLET | Freq: Four times a day (QID) | ORAL | Status: DC | PRN

## 2015-11-22 MED ORDER — FLUTICASONE FUROATE-VILANTEROL 100-25 MCG/INH IN AEPB: 1.0000 | INHALATION_SPRAY | Freq: Every day | RESPIRATORY_TRACT | Status: DC

## 2015-11-22 NOTE — Progress Notes (Signed)
Discharged PT per MD order and protocol. Reviewed discharge teaching and handouts given. Prescriptions explained and handed to patient. Pt verbalized understanding and left with all belongings. VSS. IV catheter D/C.  Patient wheeled down by staff member. Kristian Mogg J Everett, RN  
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
## 2015-11-22 NOTE — Care Management Note (Signed)
Case Management Note  Patient Details  Name: Alicia Sanchez MRN: [id number] Date of Birth: 07/28/1961   Expected Discharge Date:  11/20/15               Expected Discharge Plan:  Home/Self Care  In-House Referral:  NA  Discharge planning Services  CM Consult  Post Acute Care Choice:  NA Choice offered to:  NA  DME Arranged:    DME Agency:     HH Arranged:    HH Agency:     Status of Service:  Completed, signed off  Medicare Important Message Given:    Date Medicare IM Given:    Medicare IM give by:    Date Additional Medicare IM Given:    Additional Medicare Important Message give by:     If discussed at Long Length of Stay Meetings, dates discussed:    Additional Comments: Pt discharged home today with self care. Pt does not need supplemental O2 at DC. No CM needs at DC.  Malcolm Metrohildress, Everlina Gotts Demske, RN 11/22/2015, 10:50 AM

## 2016-12-14 ENCOUNTER — Ambulatory Visit (INDEPENDENT_AMBULATORY_CARE_PROVIDER_SITE_OTHER): Payer: No Typology Code available for payment source | Admitting: Physician Assistant

## 2016-12-14 ENCOUNTER — Encounter: Payer: Self-pay | Admitting: Physician Assistant

## 2016-12-14 ENCOUNTER — Encounter (INDEPENDENT_AMBULATORY_CARE_PROVIDER_SITE_OTHER): Payer: Self-pay

## 2016-12-14 VITALS — BP 133/85 | HR 117 | Temp 98.5°F | Ht 65.0 in | Wt 170.6 lb

## 2016-12-14 DIAGNOSIS — G2581 Restless legs syndrome: Secondary | ICD-10-CM

## 2016-12-14 DIAGNOSIS — J441 Chronic obstructive pulmonary disease with (acute) exacerbation: Secondary | ICD-10-CM

## 2016-12-14 DIAGNOSIS — F331 Major depressive disorder, recurrent, moderate: Secondary | ICD-10-CM

## 2016-12-14 DIAGNOSIS — F411 Generalized anxiety disorder: Secondary | ICD-10-CM | POA: Diagnosis not present

## 2016-12-14 DIAGNOSIS — F418 Other specified anxiety disorders: Secondary | ICD-10-CM | POA: Diagnosis not present

## 2016-12-14 DIAGNOSIS — N3281 Overactive bladder: Secondary | ICD-10-CM

## 2016-12-14 MED ORDER — ROPINIROLE HCL 0.5 MG PO TABS: 0.5000 mg | ORAL_TABLET | Freq: Every day | ORAL | 1 refills | Status: DC

## 2016-12-14 MED ORDER — DULOXETINE HCL 60 MG PO CPEP: 60.0000 mg | ORAL_CAPSULE | Freq: Every day | ORAL | 2 refills | Status: DC

## 2016-12-14 MED ORDER — ALBUTEROL SULFATE HFA 108 (90 BASE) MCG/ACT IN AERS
2.0000 | INHALATION_SPRAY | Freq: Four times a day (QID) | RESPIRATORY_TRACT | 6 refills | Status: DC | PRN
Start: 2016-12-14 — End: 2021-01-27

## 2016-12-14 MED ORDER — FLUTICASONE FUROATE-VILANTEROL 100-25 MCG/INH IN AEPB: 1.0000 | INHALATION_SPRAY | Freq: Every day | RESPIRATORY_TRACT | 6 refills | Status: DC

## 2016-12-14 MED ORDER — TIOTROPIUM BROMIDE MONOHYDRATE 18 MCG IN CAPS: 18.0000 ug | ORAL_CAPSULE | Freq: Every day | RESPIRATORY_TRACT | 12 refills | Status: DC

## 2016-12-14 MED ORDER — ALPRAZOLAM 0.5 MG PO TABS: 0.2500 mg | ORAL_TABLET | Freq: Two times a day (BID) | ORAL | 0 refills | Status: DC | PRN

## 2016-12-14 NOTE — Patient Instructions (Signed)
Overactive Bladder, Adult Introduction Overactive bladder is a group of urinary symptoms. With overactive bladder, you may suddenly feel the need to pass urine (urinate) right away. After feeling this sudden urge, you might also leak urine if you cannot get to the bathroom fast enough (urinary incontinence). These symptoms might interfere with your daily work or social activities. Overactive bladder symptoms may also wake you up at night. Overactive bladder affects the nerve signals between your bladder and your brain. Your bladder may get the signal to empty before it is full. Very sensitive muscles can also make your bladder squeeze too soon. What are the causes? Many things can cause an overactive bladder. Possible causes include:  Urinary tract infection.  Infection of nearby tissues, such as the prostate.  Prostate enlargement.  Being pregnant with twins or more (multiples).  Surgery on the uterus or urethra.  Bladder stones, inflammation, or tumors.  Drinking too much caffeine or alcohol.  Certain medicines, especially those that you take to help your body get rid of extra fluid (diuretics) by increasing urine production.  Muscle or nerve weakness, especially from:  A spinal cord injury.  Stroke.  Multiple sclerosis.  Parkinson disease.  Diabetes. This can cause a high urine volume that fills the bladder so quickly that the normal urge to urinate is triggered very strongly.  Constipation. A buildup of too much stool can put pressure on your bladder. What increases the risk? You may be at greater risk for overactive bladder if you:  Are an older adult.  Smoke.  Are going through menopause.  Have prostate problems.  Have a neurological disease, such as stroke, dementia, Parkinson disease, or multiple sclerosis (MS).  Eat or drink things that irritate the bladder. These include alcohol, spicy food, and caffeine.  Are overweight or obese. What are the signs or  symptoms? The signs and symptoms of an overactive bladder include:  Sudden, strong urges to urinate.  Leaking urine.  Urinating eight or more times per day.  Waking up to urinate two or more times per night. How is this diagnosed? Your health care provider may suspect overactive bladder based on your symptoms. The health care provider will do a physical exam and take your medical history. Blood or urine tests may also be done. For example, you might need to have a bladder function test to check how well you can hold your urine. You might also need to see a health care provider who specializes in the urinary tract (urologist). How is this treated? Treatment for overactive bladder depends on the cause of your condition and whether it is mild or severe. Certain treatments can be done in your health care provider's office or clinic. You can also make lifestyle changes at home. Options include: Behavioral Treatments  Biofeedback. A specialist uses sensors to help you become aware of your body's signals.  Keeping a daily log of when you need to urinate and what happens after the urge. This may help you manage your condition.  Bladder training. This helps you learn to control the urge to urinate by following a schedule that directs you to urinate at regular intervals (timed voiding). At first, you might have to wait a few minutes after feeling the urge. In time, you should be able to schedule bathroom visits an hour or more apart.  Kegel exercises. These are exercises to strengthen the pelvic floor muscles, which support the bladder. Toning these muscles can help you control urination, even if your bladder muscles   are overactive. A specialist will teach you how to do these exercises correctly. They require daily practice.  Weight loss. If you are obese or overweight, losing weight might relieve your symptoms of overactive bladder. Talk to your health care provider about losing weight and whether  there is a specific program or method that would work best for you.  Diet change. This might help if constipation is making your overactive bladder worse. Your health care provider or a dietitian can explain ways to change what you eat to ease constipation. You might also need to consume less alcohol and caffeine or drink other fluids at different times of the day.  Stopping smoking.  Wearing pads to absorb leakage while you wait for other treatments to take effect. Physical Treatments  Electrical stimulation. Electrodes send gentle pulses of electricity to strengthen the nerves or muscles that help to control the bladder. Sometimes, the electrodes are placed outside of the body. In other cases, they might be placed inside the body (implanted). This treatment can take several months to have an effect.  Supportive devices. Women may need a plastic device that fits into the vagina and supports the bladder (pessary). Medicines  Several medicines can help treat overactive bladder and are usually used along with other treatments. Some are injected into the muscles involved in urination. Others come in pill form. Your health care provider may prescribe:  Antispasmodics. These medicines block the signals that the nerves send to the bladder. This keeps the bladder from releasing urine at the wrong time.  Tricyclic antidepressants. These types of antidepressants also relax bladder muscles. Surgery  You may have a device implanted to help manage the nerve signals that indicate when you need to urinate.  You may have surgery to implant electrodes for electrical stimulation.  Sometimes, very severe cases of overactive bladder require surgery to change the shape of the bladder. Follow these instructions at home:  Take medicines only as directed by your health care provider.  Use any implants or a pessary as directed by your health care provider.  Make any diet or lifestyle changes that are  recommended by your health care provider. These might include:  Drinking less fluid or drinking at different times of the day. If you need to urinate often during the night, you may need to stop drinking fluids early in the evening.  Cutting down on caffeine or alcohol. Both can make an overactive bladder worse. Caffeine is found in coffee, tea, and sodas.  Doing Kegel exercises to strengthen muscles.  Losing weight if you need to.  Eating a healthy and balanced diet to prevent constipation.  Keep a journal or log to track how much and when you drink and also when you feel the need to urinate. This will help your health care provider to monitor your condition. Contact a health care provider if:  Your symptoms do not get better after treatment.  Your pain and discomfort are getting worse.  You have more frequent urges to urinate.  You have a fever. Get help right away if: You are not able to control your bladder at all. This information is not intended to replace advice given to you by your health care provider. Make sure you discuss any questions you have with your health care provider. Document Released: 09/01/2009 Document Revised: 04/12/2016 Document Reviewed: 03/31/2014  2017 Elsevier  

## 2016-12-16 ENCOUNTER — Other Ambulatory Visit: Payer: Self-pay | Admitting: Physician Assistant

## 2016-12-16 DIAGNOSIS — G2581 Restless legs syndrome: Secondary | ICD-10-CM | POA: Insufficient documentation

## 2016-12-16 DIAGNOSIS — F331 Major depressive disorder, recurrent, moderate: Secondary | ICD-10-CM | POA: Insufficient documentation

## 2016-12-16 DIAGNOSIS — N3281 Overactive bladder: Secondary | ICD-10-CM | POA: Insufficient documentation

## 2016-12-16 MED ORDER — DULOXETINE HCL 60 MG PO CPEP: 120.0000 mg | ORAL_CAPSULE | Freq: Every day | ORAL | 2 refills | Status: DC

## 2016-12-16 NOTE — Progress Notes (Signed)
BP 133/85   Pulse (!) 117   Temp 98.5 F (36.9 C) (Oral)   Ht 5\' 5"  (1.651 m)   Wt 170 lb 9.6 oz (77.4 kg)   BMI 28.39 kg/m    Subjective:    Patient ID: Alicia Sanchez, female    DOB: [birth year], 56 y.o.   MRN: [id number]  Alicia Sanchez is a 56 y.o. female presenting on 12/14/2016 for New Patient (Initial Visit); Establish Care; and Depression  Depression         This is a chronic problem.  The current episode started 1 to 4 weeks ago.   The onset quality is gradual.   The problem occurs constantly.  The problem has been gradually worsening since onset.  Associated symptoms include decreased concentration and myalgias.  Associated symptoms include no fatigue.     The symptoms are aggravated by work stress and family issues.  Past treatments include SSRIs - Selective serotonin reuptake inhibitors and SNRIs - Serotonin and norepinephrine reuptake inhibitors.  Past compliance problems include medication issues.  Past medical history includes chronic illness.     This is a new patient to our office. She was seen formally at the family practice in [place]. She has multiple medical conditions that are listed. This time she is having the most significant problems with her restless legs, COPD, depression and anxiety, and sleep interruption due to several of these things. She has never taken any restless leg medication. All of her medications are reviewed today.  Past Medical History:  Diagnosis Date  . Anxiety   . COPD (chronic obstructive pulmonary disease) (HCC)    Relevant past medical, surgical, family and social history reviewed and updated as indicated. Interim medical history since our last visit reviewed. Allergies and medications reviewed and updated.   Data reviewed from any sources in EPIC.  Review of Systems  Constitutional: Negative for activity change, fatigue and fever.  HENT: Negative.   Eyes: Negative.   Respiratory: Negative.  Negative for cough.   Cardiovascular:  Negative.  Negative for chest pain.  Gastrointestinal: Negative.  Negative for abdominal pain.  Endocrine: Negative.   Genitourinary: Negative.  Negative for dysuria.  Musculoskeletal: Positive for myalgias.  Skin: Negative.   Neurological: Positive for tremors and weakness.  Psychiatric/Behavioral: Positive for decreased concentration, depression and sleep disturbance. The patient is nervous/anxious.      Social History   Social History  . Marital status: Married    Spouse name: N/A  . Number of children: N/A  . Years of education: N/A   Occupational History  . Not on file.   Social History Main Topics  . Smoking status: Current Every Day Smoker    Packs/day: 1.00    Years: 37.00    Types: Cigarettes  . Smokeless tobacco: Never Used  . Alcohol use No  . Drug use: No  . Sexual activity: Yes    Birth control/ protection: Surgical   Other Topics Concern  . Not on file   Social History Narrative  . No narrative on file    Past Surgical History:  Procedure Laterality Date  . ABDOMINAL HYSTERECTOMY      Family History  Problem Relation Age of Onset  . COPD Mother   . COPD Father   . Diabetes type II Sister   . Diabetes type II Brother     Allergies as of 12/14/2016   No Known Allergies     Medication List       Accurate  as of 12/14/16 11:59 PM. Always use your most recent med list.          albuterol 108 (90 Base) MCG/ACT inhaler Commonly known as:  PROVENTIL HFA;VENTOLIN HFA Inhale 2 puffs into the lungs every 6 (six) hours as needed for wheezing or shortness of breath.   ALPRAZolam 0.5 MG tablet Commonly known as:  XANAX Take 0.5 tablets (0.25 mg total) by mouth 2 (two) times daily as needed for anxiety.   DULoxetine 60 MG capsule Commonly known as:  CYMBALTA Take 1 capsule (60 mg total) by mouth daily.   fluticasone furoate-vilanterol 100-25 MCG/INH Aepb Commonly known as:  BREO ELLIPTA Inhale 1 puff into the lungs daily.   rOPINIRole 0.5  MG tablet Commonly known as:  REQUIP Take 1-2 tablets (0.5-1 mg total) by mouth at bedtime.   tiotropium 18 MCG inhalation capsule Commonly known as:  SPIRIVA HANDIHALER Place 1 capsule (18 mcg total) into inhaler and inhale daily.          Objective:    BP 133/85   Pulse (!) 117   Temp 98.5 F (36.9 C) (Oral)   Ht 5\' 5"  (1.651 m)   Wt 170 lb 9.6 oz (77.4 kg)   BMI 28.39 kg/m   No Known Allergies Wt Readings from Last 3 Encounters:  12/14/16 170 lb 9.6 oz (77.4 kg)  11/22/15 152 lb (68.9 kg)  07/22/15 151 lb 2 oz (68.5 kg)    Physical Exam  Constitutional: She is oriented to person, place, and time. She appears well-developed and well-nourished.  HENT:  Head: Normocephalic and atraumatic.  Eyes: Conjunctivae and EOM are normal. Pupils are equal, round, and reactive to light.  Cardiovascular: Normal rate, regular rhythm, normal heart sounds and intact distal pulses.   Pulmonary/Chest: Effort normal and breath sounds normal.  Abdominal: Soft. Bowel sounds are normal.  Neurological: She is alert and oriented to person, place, and time. She has normal reflexes.  Skin: Skin is warm and dry. No rash noted.  Psychiatric: She has a normal mood and affect. Her behavior is normal. Judgment and thought content normal.  Nursing note and vitals reviewed.       Assessment & Plan:   1. Moderate episode of recurrent major depressive disorder (HCC) - DULoxetine (CYMBALTA) 60 MG capsule; Take 1 capsule (60 mg total) by mouth daily.  Dispense: 30 capsule; Refill: 2 - tiotropium (SPIRIVA HANDIHALER) 18 MCG inhalation capsule; Place 1 capsule (18 mcg total) into inhaler and inhale daily.  Dispense: 30 capsule; Refill: 12 - albuterol (PROVENTIL HFA;VENTOLIN HFA) 108 (90 Base) MCG/ACT inhaler; Inhale 2 puffs into the lungs every 6 (six) hours as needed for wheezing or shortness of breath.  Dispense: 1 Inhaler; Refill: 6 - fluticasone furoate-vilanterol (BREO ELLIPTA) 100-25 MCG/INH AEPB;  Inhale 1 puff into the lungs daily.  Dispense: 1 each; Refill: 6  2. Restless legs - rOPINIRole (REQUIP) 0.5 MG tablet; Take 1-2 tablets (0.5-1 mg total) by mouth at bedtime.  Dispense: 60 tablet; Refill: 1  3. OAB (overactive bladder)  4. COPD exacerbation (HCC)  5. Depression with anxiety  6. Generalized anxiety disorder - ALPRAZolam (XANAX) 0.5 MG tablet; Take 0.5 tablets (0.25 mg total) by mouth 2 (two) times daily as needed for anxiety.  Dispense: 30 tablet; Refill: 0   Continue all other maintenance medications as listed above. Educational handout given for restless legs  Follow up plan: Return in about 4 weeks (around 01/11/2017) for recheck.  Remus LofflerAngel S. Kenijah Benningfield PA-C [facility] [address]  [place], [place] [address] [phone]   12/16/2016, 9:41 PM

## 2016-12-16 NOTE — Telephone Encounter (Signed)
Prescription sent to pharmacy.

## 2016-12-18 ENCOUNTER — Telehealth: Payer: Self-pay | Admitting: Physician Assistant

## 2016-12-18 DIAGNOSIS — G2581 Restless legs syndrome: Secondary | ICD-10-CM

## 2016-12-19 MED ORDER — ROPINIROLE HCL 1 MG PO TABS: 1.0000 mg | ORAL_TABLET | Freq: Every day | ORAL | 0 refills | Status: DC

## 2016-12-19 NOTE — Telephone Encounter (Signed)
Increase Requip to 1.0 mg.  Prescription is sent in. She may titrate up every few days to a total of 4 mg. She currently should be taking 1 mg.

## 2016-12-21 NOTE — Telephone Encounter (Signed)
Patient aware of recommendations.  

## 2016-12-31 ENCOUNTER — Telehealth: Payer: Self-pay | Admitting: Physician Assistant

## 2017-01-04 ENCOUNTER — Encounter: Payer: Self-pay | Admitting: Physician Assistant

## 2017-01-04 ENCOUNTER — Ambulatory Visit (INDEPENDENT_AMBULATORY_CARE_PROVIDER_SITE_OTHER): Payer: No Typology Code available for payment source | Admitting: Physician Assistant

## 2017-01-04 VITALS — BP 121/88 | HR 107 | Temp 97.5°F | Ht 65.0 in | Wt 170.0 lb

## 2017-01-04 DIAGNOSIS — Z0000 Encounter for general adult medical examination without abnormal findings: Secondary | ICD-10-CM

## 2017-01-04 DIAGNOSIS — F331 Major depressive disorder, recurrent, moderate: Secondary | ICD-10-CM

## 2017-01-04 DIAGNOSIS — G2581 Restless legs syndrome: Secondary | ICD-10-CM | POA: Diagnosis not present

## 2017-01-04 DIAGNOSIS — J441 Chronic obstructive pulmonary disease with (acute) exacerbation: Secondary | ICD-10-CM

## 2017-01-04 MED ORDER — UMECLIDINIUM BROMIDE 62.5 MCG/INH IN AEPB: 1.0000 | INHALATION_SPRAY | Freq: Every day | RESPIRATORY_TRACT | 11 refills | Status: DC

## 2017-01-04 MED ORDER — DOXEPIN HCL 25 MG PO CAPS: 25.0000 mg | ORAL_CAPSULE | Freq: Every day | ORAL | 2 refills | Status: DC

## 2017-01-04 MED ORDER — ROPINIROLE HCL 1 MG PO TABS: 1.0000 mg | ORAL_TABLET | Freq: Every day | ORAL | 2 refills | Status: DC
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
## 2017-01-04 NOTE — Patient Instructions (Signed)
In a few days you may receive a survey in the mail or online from [employer] regarding your visit with us today. Please take a moment to fill this out. Your feedback is very important to our whole office. It can help us better understand your needs as well as improve your experience and satisfaction. Thank you for taking your time to complete it. We care about you.  Prudy FeelerAngel Yvanna Vidas, PA-C   Insomnia Insomnia is a sleep disorder that makes it difficult to fall asleep or to stay asleep. Insomnia can cause tiredness (fatigue), low energy, difficulty concentrating, mood swings, and poor performance at work or school. There are three different ways to classify insomnia:  Difficulty falling asleep.  Difficulty staying asleep.  Waking up too early in the morning. Any type of insomnia can be long-term (chronic) or short-term (acute). Both are common. Short-term insomnia usually lasts for three months or less. Chronic insomnia occurs at least three times a week for longer than three months. What are the causes? Insomnia may be caused by another condition, situation, or substance, such as:  Anxiety.  Certain medicines.  Gastroesophageal reflux disease (GERD) or other gastrointestinal conditions.  Asthma or other breathing conditions.  Restless legs syndrome, sleep apnea, or other sleep disorders.  Chronic pain.  Menopause. This may include hot flashes.  Stroke.  Abuse of alcohol, tobacco, or illegal drugs.  Depression.  Caffeine.  Neurological disorders, such as Alzheimer disease.  An overactive thyroid (hyperthyroidism). The cause of insomnia may not be known. What increases the risk? Risk factors for insomnia include:  Gender. Women are more commonly affected than men.  Age. Insomnia is more common as you get older.  Stress. This may involve your professional or personal life.  Income. Insomnia is more common in people with lower income.  Lack of exercise.  Irregular work  schedule or night shifts.  Traveling between different time zones. What are the signs or symptoms? If you have insomnia, trouble falling asleep or trouble staying asleep is the main symptom. This may lead to other symptoms, such as:  Feeling fatigued.  Feeling nervous about going to sleep.  Not feeling rested in the morning.  Having trouble concentrating.  Feeling irritable, anxious, or depressed. How is this treated? Treatment for insomnia depends on the cause. If your insomnia is caused by an underlying condition, treatment will focus on addressing the condition. Treatment may also include:  Medicines to help you sleep.  Counseling or therapy.  Lifestyle adjustments. Follow these instructions at home:  Take medicines only as directed by your health care provider.  Keep regular sleeping and waking hours. Avoid naps.  Keep a sleep diary to help you and your health care provider figure out what could be causing your insomnia. Include:  When you sleep.  When you wake up during the night.  How well you sleep.  How rested you feel the next day.  Any side effects of medicines you are taking.  What you eat and drink.  Make your bedroom a comfortable place where it is easy to fall asleep:  Put up shades or special blackout curtains to block light from outside.  Use a white noise machine to block noise.  Keep the temperature cool.  Exercise regularly as directed by your health care provider. Avoid exercising right before bedtime.  Use relaxation techniques to manage stress. Ask your health care provider to suggest some techniques that may work well for you. These may include:  Breathing exercises.  Routines to release muscle tension.  Visualizing peaceful scenes.  Cut back on alcohol, caffeinated beverages, and cigarettes, especially close to bedtime. These can disrupt your sleep.  Do not overeat or eat spicy foods right before bedtime. This can lead to  digestive discomfort that can make it hard for you to sleep.  Limit screen use before bedtime. This includes:  Watching TV.  Using your smartphone, tablet, and computer.  Stick to a routine. This can help you fall asleep faster. Try to do a quiet activity, brush your teeth, and go to bed at the same time each night.  Get out of bed if you are still awake after 15 minutes of trying to sleep. Keep the lights down, but try reading or doing a quiet activity. When you feel sleepy, go back to bed.  Make sure that you drive carefully. Avoid driving if you feel very sleepy.  Keep all follow-up appointments as directed by your health care provider. This is important. Contact a health care provider if:  You are tired throughout the day or have trouble in your daily routine due to sleepiness.  You continue to have sleep problems or your sleep problems get worse. Get help right away if:  You have serious thoughts about hurting yourself or someone else. This information is not intended to replace advice given to you by your health care provider. Make sure you discuss any questions you have with your health care provider. Document Released: 11/02/2000 Document Revised: 04/06/2016 Document Reviewed: 08/06/2014 Elsevier Interactive Patient Education  2017 [employer].

## 2017-01-05 LAB — CBC WITH DIFFERENTIAL/PLATELET
BASOS: 1 %
Basophils Absolute: 0 10*3/uL (ref 0.0–0.2)
EOS (ABSOLUTE): 0.1 10*3/uL (ref 0.0–0.4)
Eos: 2 %
HEMATOCRIT: 42.9 % (ref 34.0–46.6)
HEMOGLOBIN: 14.8 g/dL (ref 11.1–15.9)
Immature Grans (Abs): 0 10*3/uL (ref 0.0–0.1)
Immature Granulocytes: 0 %
LYMPHS ABS: 3 10*3/uL (ref 0.7–3.1)
Lymphs: 37 %
MCH: 30 pg (ref 26.6–33.0)
MCHC: 34.5 g/dL (ref 31.5–35.7)
MCV: 87 fL (ref 79–97)
MONOS ABS: 0.9 10*3/uL (ref 0.1–0.9)
Monocytes: 12 %
Neutrophils Absolute: 4 10*3/uL (ref 1.4–7.0)
Neutrophils: 48 %
Platelets: 312 10*3/uL (ref 150–379)
RBC: 4.94 x10E6/uL (ref 3.77–5.28)
RDW: 13.7 % (ref 12.3–15.4)
WBC: 8.1 10*3/uL (ref 3.4–10.8)

## 2017-01-05 LAB — LIPID PANEL
CHOL/HDL RATIO: 4 ratio (ref 0.0–4.4)
Cholesterol, Total: 184 mg/dL (ref 100–199)
HDL: 46 mg/dL (ref >39)
LDL Calculated: 94 mg/dL (ref 0–99)
Triglycerides: 221 mg/dL — ABNORMAL HIGH (ref 0–149)
VLDL Cholesterol Cal: 44 mg/dL — ABNORMAL HIGH (ref 5–40)

## 2017-01-05 LAB — CMP14+EGFR
A/G RATIO: 2.1 (ref 1.2–2.2)
ALT: 21 IU/L (ref 0–32)
AST: 21 IU/L (ref 0–40)
Albumin: 4.2 g/dL (ref 3.5–5.5)
Alkaline Phosphatase: 59 IU/L (ref 39–117)
BUN/Creatinine Ratio: 7 — ABNORMAL LOW (ref 9–23)
BUN: 6 mg/dL (ref 6–24)
Bilirubin Total: 0.4 mg/dL (ref 0.0–1.2)
CALCIUM: 9.3 mg/dL (ref 8.7–10.2)
CO2: 24 mmol/L (ref 18–29)
Chloride: 102 mmol/L (ref 96–106)
Creatinine, Ser: 0.81 mg/dL (ref 0.57–1.00)
GFR, EST AFRICAN AMERICAN: 94 mL/min/{1.73_m2} (ref >59)
GFR, EST NON AFRICAN AMERICAN: 81 mL/min/{1.73_m2} (ref >59)
GLOBULIN, TOTAL: 2 g/dL (ref 1.5–4.5)
Glucose: 72 mg/dL (ref 65–99)
POTASSIUM: 4.1 mmol/L (ref 3.5–5.2)
SODIUM: 142 mmol/L (ref 134–144)
TOTAL PROTEIN: 6.2 g/dL (ref 6.0–8.5)

## 2017-01-05 LAB — TSH: TSH: 1.21 u[IU]/mL (ref 0.450–4.500)

## 2017-01-10 NOTE — Telephone Encounter (Signed)
Patient was seen for visit on 01/04/17

## 2017-01-14 ENCOUNTER — Ambulatory Visit: Payer: No Typology Code available for payment source | Admitting: Physician Assistant

## 2017-02-09 ENCOUNTER — Other Ambulatory Visit: Payer: Self-pay | Admitting: Physician Assistant

## 2017-02-09 DIAGNOSIS — F411 Generalized anxiety disorder: Secondary | ICD-10-CM

## 2017-03-20 ENCOUNTER — Other Ambulatory Visit: Payer: Self-pay | Admitting: Physician Assistant

## 2017-03-20 DIAGNOSIS — G2581 Restless legs syndrome: Secondary | ICD-10-CM

## 2017-03-22 ENCOUNTER — Ambulatory Visit (INDEPENDENT_AMBULATORY_CARE_PROVIDER_SITE_OTHER): Payer: No Typology Code available for payment source | Admitting: Physician Assistant

## 2017-03-22 ENCOUNTER — Encounter: Payer: Self-pay | Admitting: Physician Assistant

## 2017-03-22 VITALS — BP 117/76 | HR 108 | Temp 97.3°F | Ht 65.0 in | Wt 177.2 lb

## 2017-03-22 DIAGNOSIS — J441 Chronic obstructive pulmonary disease with (acute) exacerbation: Secondary | ICD-10-CM

## 2017-03-22 DIAGNOSIS — F331 Major depressive disorder, recurrent, moderate: Secondary | ICD-10-CM

## 2017-03-22 DIAGNOSIS — F411 Generalized anxiety disorder: Secondary | ICD-10-CM | POA: Insufficient documentation

## 2017-03-22 MED ORDER — BUSPIRONE HCL 10 MG PO TABS: 10.0000 mg | ORAL_TABLET | Freq: Three times a day (TID) | ORAL | 2 refills | Status: DC

## 2017-03-22 NOTE — Patient Instructions (Signed)
Generalized Anxiety Disorder, Adult Generalized anxiety disorder (GAD) is a mental health disorder. People with this condition constantly worry about everyday events. Unlike normal anxiety, worry related to GAD is not triggered by a specific event. These worries also do not fade or get better with time. GAD interferes with life functions, including relationships, work, and school. GAD can vary from mild to severe. People with severe GAD can have intense waves of anxiety with physical symptoms (panic attacks). What are the causes? The exact cause of GAD is not known. What increases the risk? This condition is more likely to develop in:  Women.  People who have a family history of anxiety disorders.  People who are very shy.  People who experience very stressful life events, such as the death of a loved one.  People who have a very stressful family environment. What are the signs or symptoms? People with GAD often worry excessively about many things in their lives, such as their health and family. They may also be overly concerned about:  Doing well at work.  Being on time.  Natural disasters.  Friendships. Physical symptoms of GAD include:  Fatigue.  Muscle tension or having muscle twitches.  Trembling or feeling shaky.  Being easily startled.  Feeling like your heart is pounding or racing.  Feeling out of breath or like you cannot take a deep breath.  Having trouble falling asleep or staying asleep.  Sweating.  Nausea, diarrhea, or irritable bowel syndrome (IBS).  Headaches.  Trouble concentrating or remembering facts.  Restlessness.  Irritability. How is this diagnosed? Your health care provider can diagnose GAD based on your symptoms and medical history. You will also have a physical exam. The health care provider will ask specific questions about your symptoms, including how severe they are, when they started, and if they come and go. Your health care  provider may ask you about your use of alcohol or drugs, including prescription medicines. Your health care provider may refer you to a mental health specialist for further evaluation. Your health care provider will do a thorough examination and may perform additional tests to rule out other possible causes of your symptoms. To be diagnosed with GAD, a person must have anxiety that:  Is out of his or her control.  Affects several different aspects of his or her life, such as work and relationships.  Causes distress that makes him or her unable to take part in normal activities.  Includes at least three physical symptoms of GAD, such as restlessness, fatigue, trouble concentrating, irritability, muscle tension, or sleep problems. Before your health care provider can confirm a diagnosis of GAD, these symptoms must be present more days than they are not, and they must last for six months or longer. How is this treated? The following therapies are usually used to treat GAD:  Medicine. Antidepressant medicine is usually prescribed for long-term daily control. Antianxiety medicines may be added in severe cases, especially when panic attacks occur.  Talk therapy (psychotherapy). Certain types of talk therapy can be helpful in treating GAD by providing support, education, and guidance. Options include:  Cognitive behavioral therapy (CBT). People learn coping skills and techniques to ease their anxiety. They learn to identify unrealistic or negative thoughts and behaviors and to replace them with positive ones.  Acceptance and commitment therapy (ACT). This treatment teaches people how to be mindful as a way to cope with unwanted thoughts and feelings.  Biofeedback. This process trains you to manage your body's response (  physiological response) through breathing techniques and relaxation methods. You will work with a therapist while machines are used to monitor your physical symptoms.  Stress  management techniques. These include yoga, meditation, and exercise. A mental health specialist can help determine which treatment is best for you. Some people see improvement with one type of therapy. However, other people require a combination of therapies. Follow these instructions at home:  Take over-the-counter and prescription medicines only as told by your health care provider.  Try to maintain a normal routine.  Try to anticipate stressful situations and allow extra time to manage them.  Practice any stress management or self-calming techniques as taught by your health care provider.  Do not punish yourself for setbacks or for not making progress.  Try to recognize your accomplishments, even if they are small.  Keep all follow-up visits as told by your health care provider. This is important. Contact a health care provider if:  Your symptoms do not get better.  Your symptoms get worse.  You have signs of depression, such as:  A persistently sad, cranky, or irritable mood.  Loss of enjoyment in activities that used to bring you joy.  Change in weight or eating.  Changes in sleeping habits.  Avoiding friends or family members.  Loss of energy for normal tasks.  Feelings of guilt or worthlessness. Get help right away if:  You have serious thoughts about hurting yourself or others. If you ever feel like you may hurt yourself or others, or have thoughts about taking your own life, get help right away. You can go to your nearest emergency department or call:  Your local emergency services (911 in the [place]  A suicide crisis helpline, such as the National Suicide Prevention Lifeline at 1-800-273-8255. This is open 24 hours a day. Summary  Generalized anxiety disorder (GAD) is a mental health disorder that involves worry that is not triggered by a specific event.  People with GAD often worry excessively about many things in their lives, such as their health and  family.  GAD may cause physical symptoms such as restlessness, trouble concentrating, sleep problems, frequent sweating, nausea, diarrhea, headaches, and trembling or muscle twitching.  A mental health specialist can help determine which treatment is best for you. Some people see improvement with one type of therapy. However, other people require a combination of therapies. This information is not intended to replace advice given to you by your health care provider. Make sure you discuss any questions you have with your health care provider. Document Released: 03/02/2013 Document Revised: 09/25/2016 Document Reviewed: 09/25/2016 Elsevier Interactive Patient Education  2017 [employer] Inc[employer]  

## 2017-03-22 NOTE — Progress Notes (Signed)
   Subjective:   Alicia Sanchez is an 56 y.o. female who presents for evaluation and treatment of depressive symptoms.  Onset approximately 30 years ago, stable since that time.  Current symptoms include depressed mood, hopelessness, impaired memory, anxiety,.  Current treatment for depression:None Sleep problems: Mild   Early awakening:Absent   Energy: Good Motivation: Good Concentration: Fair Rumination/worrying: Moderate Memory: Fair Tearfulness: Mild  Anxiety: Moderate  Panic: Mild  Overall Mood: Moderately improved  Hopelessness: Mild Suicidal ideation: Absent  Other/Psychosocial Stressors: none Family history positive for depression in the patient's father and mother.  Previous treatment modalities employed include Individual therapy.  Past episodes of depression:more than 3 Organic causes of depression present: None.  Review of Systems Pertinent items noted in HPI and remainder of comprehensive ROS otherwise negative.   Objective:   Mental Status Examination: Posture and motor behavior: Appropriate Dress, grooming, personal hygiene: Appropriate Facial expression: Positive for flat Speech: Appropriate Mood: Appropriate Coherency and relevance of thought: Appropriate Thought content: Appropriate Perceptions: Appropriate Orientation:Appropriate Attention and concentration: Appropriate Memory: : Appropriate Information: Not examined Vocabulary: Appropriate Abstract reasoning: Not examined Judgment: Appropriate    Assessment:   Experiencing the following symptoms of depression most of the day nearly every day for more than two consecutive weeks: depressed mood, loss of interests/pleasure, trouble concentrating  Depressive Disorder Anxiety Disorder  Suicidal plan: none Access to means for suicide: no  1. Moderate episode of recurrent major depressive disorder (HCC)  2. GAD (generalized anxiety disorder) - busPIRone (BUSPAR) 10 MG tablet; Take 1 tablet (10  mg total) by mouth 3 (three) times daily.  Dispense: 90 tablet; Refill: 2  3. COPD exacerbation (HCC) - Ambulatory referral to Pulmonology  Plan:     Current Outpatient Prescriptions:  .  albuterol (PROVENTIL HFA;VENTOLIN HFA) 108 (90 Base) MCG/ACT inhaler, Inhale 2 puffs into the lungs every 6 (six) hours as needed for wheezing or shortness of breath., Disp: 1 Inhaler, Rfl: 6 .  ALPRAZolam (XANAX) 0.5 MG tablet, TAKE ONE-HALF TABLET BY MOUTH TWICE DAILY AS NEEDED FOR ANXIETY, Disp: 30 tablet, Rfl: 2 .  doxepin (SINEQUAN) 25 MG capsule, Take 1-2 capsules (25-50 mg total) by mouth at bedtime., Disp: 60 capsule, Rfl: 2 .  DULoxetine (CYMBALTA) 60 MG capsule, Take 2 capsules (120 mg total) by mouth daily., Disp: 60 capsule, Rfl: 2 .  fluticasone furoate-vilanterol (BREO ELLIPTA) 100-25 MCG/INH AEPB, Inhale 1 puff into the lungs daily., Disp: 1 each, Rfl: 6 .  rOPINIRole (REQUIP) 0.5 MG tablet, TAKE ONE TO TWO TABLETS BY MOUTH AT BEDTIME, Disp: 60 tablet, Rfl: 2 .  umeclidinium bromide (INCRUSE ELLIPTA) 62.5 MCG/INH AEPB, Inhale 1 puff into the lungs daily., Disp: 30 each, Rfl: 11 .  busPIRone (BUSPAR) 10 MG tablet, Take 1 tablet (10 mg total) by mouth 3 (three) times daily., Disp: 90 tablet, Rfl: 2   Reviewed concept of depression as biochemical imbalance of neurotransmitters and rationale for treatment. Instructed patient to contact office or on-call physician promptly should condition worsen or any new symptoms appear and provided on-call telephone numbers.

## 2017-04-04 ENCOUNTER — Other Ambulatory Visit: Payer: Self-pay

## 2017-04-04 DIAGNOSIS — F331 Major depressive disorder, recurrent, moderate: Secondary | ICD-10-CM

## 2017-04-04 MED ORDER — DULOXETINE HCL 60 MG PO CPEP: 120.0000 mg | ORAL_CAPSULE | Freq: Every day | ORAL | 2 refills | Status: DC

## 2017-04-08 ENCOUNTER — Other Ambulatory Visit: Payer: Self-pay | Admitting: Physician Assistant

## 2017-04-08 DIAGNOSIS — F331 Major depressive disorder, recurrent, moderate: Secondary | ICD-10-CM

## 2017-04-25 ENCOUNTER — Ambulatory Visit (INDEPENDENT_AMBULATORY_CARE_PROVIDER_SITE_OTHER)
Admission: RE | Admit: 2017-04-25 | Discharge: 2017-04-25 | Disposition: A | Discharge status: Home / Self Care | Payer: No Typology Code available for payment source | Source: Ambulatory Visit | Attending: Pulmonary Disease | Admitting: Pulmonary Disease

## 2017-04-25 ENCOUNTER — Other Ambulatory Visit: Payer: No Typology Code available for payment source

## 2017-04-25 ENCOUNTER — Ambulatory Visit (INDEPENDENT_AMBULATORY_CARE_PROVIDER_SITE_OTHER): Payer: No Typology Code available for payment source | Admitting: Pulmonary Disease

## 2017-04-25 ENCOUNTER — Encounter: Payer: Self-pay | Admitting: Pulmonary Disease

## 2017-04-25 VITALS — BP 136/74 | HR 108 | Ht 64.5 in | Wt 180.4 lb

## 2017-04-25 DIAGNOSIS — F17210 Nicotine dependence, cigarettes, uncomplicated: Secondary | ICD-10-CM | POA: Diagnosis not present

## 2017-04-25 DIAGNOSIS — J449 Chronic obstructive pulmonary disease, unspecified: Secondary | ICD-10-CM | POA: Diagnosis not present

## 2017-04-25 DIAGNOSIS — G2581 Restless legs syndrome: Secondary | ICD-10-CM

## 2017-04-25 DIAGNOSIS — Z720 Tobacco use: Secondary | ICD-10-CM

## 2017-04-25 NOTE — Patient Instructions (Signed)
We will schedule for PFTs, sleep study CXR today, A1AT levels and phenotype You will need a referral for screening CT of the chest Continue on breo and incruse  Return in 1-2 months

## 2017-04-25 NOTE — Progress Notes (Signed)
Alicia Sanchez    [id number]    [birth year]  Primary Care Physician:Jones, Wayland Salinas, PA-C  Referring Physician: Remus Loffler, PA-C [address] [place], [place] [address]  Chief complaint:  Consult for evaluation of COPD  HPI: [name] is a 56 year old active smoker with history of anxiety, depression, COPD. She was diagnosed with COPD several years ago but does not have any PFTs on record. She was started on breo and incruse in January 2018 by her primary care physician. She reports daily symptoms of dyspnea with activity, productive cough with white mucus production, chest congestion. She also symptoms of fatigue, exertion, anxiety  She has daily symptoms of snoring. She does sleep study 4 years ago but does not know the result of this test. She is a diagnosis of restless leg syndrome and is on requip  Pets:Dog which lives outside the house. No pets, exotic pets. Occupation:Customer sevice rep. previously worked d in [employer] Exposures: His current dust exposure and previous exposure to cotton fiber in her line of work Smoking history: 35-pack-year smoking history. Continues to smoke 1 pack per day  Outpatient Encounter Prescriptions as of 04/25/2017  Medication Sig  . albuterol (PROVENTIL HFA;VENTOLIN HFA) 108 (90 Base) MCG/ACT inhaler Inhale 2 puffs into the lungs every 6 (six) hours as needed for wheezing or shortness of breath.  . ALPRAZolam (XANAX) 0.5 MG tablet TAKE ONE-HALF TABLET BY MOUTH TWICE DAILY AS NEEDED FOR ANXIETY  . baclofen (LIORESAL) 20 MG tablet Take 20 mg by mouth 2 (two) times daily. ~not her rx, takes her spouse's rx~  . busPIRone (BUSPAR) 10 MG tablet Take 1 tablet (10 mg total) by mouth 3 (three) times daily.  Marland Kitchen doxepin (SINEQUAN) 25 MG capsule TAKE 1 TO 2 CAPSULES BY MOUTH AT BEDTIME  . DULoxetine (CYMBALTA) 60 MG capsule Take 2 capsules (120 mg total) by mouth daily.  . fluticasone furoate-vilanterol (BREO ELLIPTA) 100-25 MCG/INH AEPB Inhale 1  puff into the lungs daily.  . Multiple Vitamin (MULTIVITAMIN WITH MINERALS) TABS tablet Take 1 tablet by mouth daily.  Marland Kitchen rOPINIRole (REQUIP) 0.5 MG tablet TAKE ONE TO TWO TABLETS BY MOUTH AT BEDTIME  . umeclidinium bromide (INCRUSE ELLIPTA) 62.5 MCG/INH AEPB Inhale 1 puff into the lungs daily.   No facility-administered encounter medications on file as of 04/25/2017.     Allergies as of 04/25/2017  . (No Known Allergies)    Past Medical History:  Diagnosis Date  . Anxiety   . COPD (chronic obstructive pulmonary disease) (HCC)     Past Surgical History:  Procedure Laterality Date  . ABDOMINAL HYSTERECTOMY      Family History  Problem Relation Age of Onset  . COPD Mother   . COPD Father   . Diabetes type II Sister   . Diabetes type II Brother   . COPD Maternal Grandfather     Social History   Social History  . Marital status: Married    Spouse name: N/A  . Number of children: N/A  . Years of education: N/A   Occupational History  . Not on file.   Social History Main Topics  . Smoking status: Current Every Day Smoker    Packs/day: 1.00    Years: 37.00    Types: Cigarettes  . Smokeless tobacco: Never Used  . Alcohol use No  . Drug use: No  . Sexual activity: Yes    Birth control/ protection: Surgical   Other Topics Concern  .  Not on file   Social History Narrative  . No narrative on file    Review of systems: Review of Systems  Constitutional: Negative for fever and chills.  HENT: Negative.   Eyes: Negative for blurred vision.  Respiratory: as per HPI  Cardiovascular: Negative for chest pain and palpitations.  Gastrointestinal: Negative for vomiting, diarrhea, blood per rectum. Genitourinary: Negative for dysuria, urgency, frequency and hematuria.  Musculoskeletal: Negative for myalgias, back pain and joint pain.  Skin: Negative for itching and rash.  Neurological: Negative for dizziness, tremors, focal weakness, seizures and loss of consciousness.    Endo/Heme/Allergies: Negative for environmental allergies.  Psychiatric/Behavioral: Negative for depression, suicidal ideas and hallucinations.  All other systems reviewed and are negative.  Physical Exam: Blood pressure 136/74, pulse (!) 108, height 5' 4.5" (1.638 m), weight 180 lb 6.4 oz (81.8 kg), SpO2 94 %. 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 Stable compared to prior I have reviewed all images personally  Assessment:  COPD, chronic bronchitis She likely has COPD based on her smoking history and symptoms. She is on good inhaler regimen with breo and incruse and will continue the same. Reassess with pulmonary function tests and chest x-ray today   Active smoker She continues to smoke 1 pack per day. I have discussed smoking cessation with her and she reports that nicotine patches has not worked in the past. Would like to avoid Chantix or Wellbutrin given her psychiatric history. She wants to quit on her own. Time spent counseling-5 minutes She'll be referred for screening CTs of the chest.  Restless leg syndrome, suspected sleep apnea On requip for restless leg syndrome. We'll schedule her for split-night sleep study  Plan/Recommendations: - Continue breo and incruse - CXR, PFTs - Referral for screening CT program - Split-night sleep  study  Chilton GreathousePraveen Alejos Reinhardt MD [facility] Pager [phone] 04/25/2017, 9:43 AM  CC: Remus LofflerJones, Angel S, PA-C

## 2017-04-29 ENCOUNTER — Telehealth: Payer: Self-pay | Admitting: Pulmonary Disease

## 2017-04-29 LAB — ALPHA-1-ANTITRYPSIN: A-1 Antitrypsin, Ser: 136 mg/dL (ref 83–199)

## 2017-04-29 NOTE — Progress Notes (Signed)
Pt notified of results

## 2017-04-29 NOTE — Telephone Encounter (Signed)
Alicia Sanchez, Praveen, MD  Blankenship, Margie A, CMA        Please let the patient know that the CXR shows chronic changes consistent with COPD. There are no acute changes   Spoke with the spouse and notified of the above results and he verbalized understanding.

## 2017-04-29 NOTE — Progress Notes (Signed)
LMTCB

## 2017-04-30 ENCOUNTER — Other Ambulatory Visit: Payer: Self-pay | Admitting: Acute Care

## 2017-04-30 DIAGNOSIS — F17210 Nicotine dependence, cigarettes, uncomplicated: Secondary | ICD-10-CM

## 2017-05-01 LAB — ALPHA-1 ANTITRYPSIN PHENOTYPE: A1 ANTITRYPSIN: 131 mg/dL (ref 83–199)

## 2017-05-08 ENCOUNTER — Encounter: Payer: Self-pay | Admitting: Acute Care

## 2017-05-08 ENCOUNTER — Ambulatory Visit (INDEPENDENT_AMBULATORY_CARE_PROVIDER_SITE_OTHER)
Admission: RE | Admit: 2017-05-08 | Discharge: 2017-05-08 | Disposition: A | Discharge status: Home / Self Care | Payer: No Typology Code available for payment source | Source: Ambulatory Visit | Attending: Acute Care | Admitting: Acute Care

## 2017-05-08 ENCOUNTER — Ambulatory Visit (INDEPENDENT_AMBULATORY_CARE_PROVIDER_SITE_OTHER): Payer: No Typology Code available for payment source | Admitting: Acute Care

## 2017-05-08 DIAGNOSIS — F17210 Nicotine dependence, cigarettes, uncomplicated: Secondary | ICD-10-CM | POA: Diagnosis not present

## 2017-05-08 DIAGNOSIS — Z87891 Personal history of nicotine dependence: Secondary | ICD-10-CM

## 2017-05-08 NOTE — Progress Notes (Signed)
Shared Decision Making Visit Lung Cancer Screening Program [phone])   Eligibility:  Age 56 y.o.  Pack Years Smoking History Calculation 42-pack-year smoking history (# packs/per year x # years smoked)  Recent History of coughing up blood  no  Unexplained weight loss? no ( >Than 15 pounds within the last 6 months )  Prior History Lung / other cancer no (Diagnosis within the last 5 years already requiring surveillance chest CT Scans).  Smoking Status Current Smoker  Former Smokers: Years since quit: Not applicable  Quit Date: Not applicable  Visit Components:  Discussion included one or more decision making aids. yes  Discussion included risk/benefits of screening. yes  Discussion included potential follow up diagnostic testing for abnormal scans. yes  Discussion included meaning and risk of over diagnosis. yes  Discussion included meaning and risk of False Positives. yes  Discussion included meaning of total radiation exposure. yes  Counseling Included:  Importance of adherence to annual lung cancer LDCT screening. yes  Impact of comorbidities on ability to participate in the program. yes  Ability and willingness to under diagnostic treatment. yes  Smoking Cessation Counseling:  Current Smokers:   Discussed importance of smoking cessation. yes  Information about tobacco cessation classes and interventions provided to patient. yes  Patient provided with "ticket" for LDCT Scan. yes  Symptomatic Patient. no  Counseling  Diagnosis Code: Tobacco Use Z72.0  Asymptomatic Patient yes  Counseling (Intermediate counseling: > three minutes counseling) U0454  Former Smokers:   Discussed the importance of maintaining cigarette abstinence. yes  Diagnosis Code: Personal History of Nicotine Dependence. U98.119  Information about tobacco cessation classes and interventions provided to patient. Yes  Patient provided with "ticket" for LDCT Scan. yes  Written Order  for Lung Cancer Screening with LDCT placed in Epic. Yes (CT Chest Lung Cancer Screening Low Dose W/O CM) [id number] Z12.2-Screening of respiratory organs Z87.891-Personal history of nicotine dependence  I have spent 25 minutes of face to face time with Alicia Sanchez discussing the risks and benefits of lung cancer screening. We viewed a power point together that explained in detail the above noted topics. We paused at intervals to allow for questions to be asked and answered to ensure understanding.We discussed that the single most powerful action that she can take to decrease her risk of developing lung cancer is to quit smoking. We discussed whether or not she is ready to commit to setting a quit date. She is interested in starting the discussion, but she is not ready to set a quit date. We discussed options for tools to aid in quitting smoking including nicotine replacement therapy, non-nicotine medications, support groups, Quit Smart classes, and behavior modification. We discussed that often times setting smaller, more achievable goals, such as eliminating 1 cigarette a day for a week and then 2 cigarettes a day for a week can be helpful in slowly decreasing the number of cigarettes smoked. This allows for a sense of accomplishment as well as providing a clinical benefit. I gave her the " Be Stronger Than Your Excuses" card with contact information for community resources, classes, free nicotine replacement therapy, and access to mobile apps, text messaging, and on-line smoking cessation help. I have also given Alicia Sanchez my card and contact information in the event she needs to contact me. We discussed the time and location of the scan, and that either Abigail Miyamoto RN or I will call with the results within 24-48 hours of receiving them. I have offered her  a  copy of the power point we viewed  as a resource in the event they need reinforcement of the concepts we discussed today in the office. The patient  verbalized understanding of all of  the above and had no further questions upon leaving the office. They have my contact information in the event they have any further questions.  I spent 4 minutes counseling on smoking cessation and the health risks of continued tobacco abuse.  I explained to the patient that there has been a high incidence of coronary artery disease noted on these exams. I explained that this is a non-gated exam therefore degree or severity cannot be determined. This patient is not on statin therapy. I have asked the patient to follow-up with their PCP regarding any incidental finding of coronary artery disease and management with diet or medication as their PCP  feels is clinically indicated. The patient verbalized understanding of the above and had no further questions upon completion of the visit.      Bevelyn NgoSarah F Tyrel Lex, NP 05/08/2017

## 2017-05-09 ENCOUNTER — Telehealth: Payer: Self-pay | Admitting: Acute Care

## 2017-05-09 DIAGNOSIS — F17210 Nicotine dependence, cigarettes, uncomplicated: Secondary | ICD-10-CM

## 2017-05-10 NOTE — Telephone Encounter (Signed)
Patient returning call and states she was told to ask for BellDenise or Shanda BumpsJessica.  Advised Angelique BlonderDenise is not here, Shanda BumpsJessica is unavailable.  She states she will be going to work now until 11 pm tonight and may leave a message at [phone].  Will let Shanda BumpsJessica know patient called as well.

## 2017-05-10 NOTE — Telephone Encounter (Signed)
LMOM TCB x1 - there was no name on the VM, did not feel comfortable leaving details on the voicemail.

## 2017-05-13 NOTE — Telephone Encounter (Signed)
Pt informed of CT results per Sarah Groce, NP.  PT verbalized understanding.  Copy sent to PCP.  Order placed for 1 yr f/u CT.  

## 2017-05-13 NOTE — Telephone Encounter (Signed)
LMTC x 1  

## 2017-05-14 IMAGING — DX DG CERVICAL SPINE COMPLETE 4+V
5 series · 5 of 5 positions shown · non-contrast
Comparison: None.

CLINICAL DATA: Left-sided neck pain and left arm pain for 1 month.
Decreased range of motion.

EXAM:
CERVICAL SPINE  4+ VIEWS

[c-spine lat]
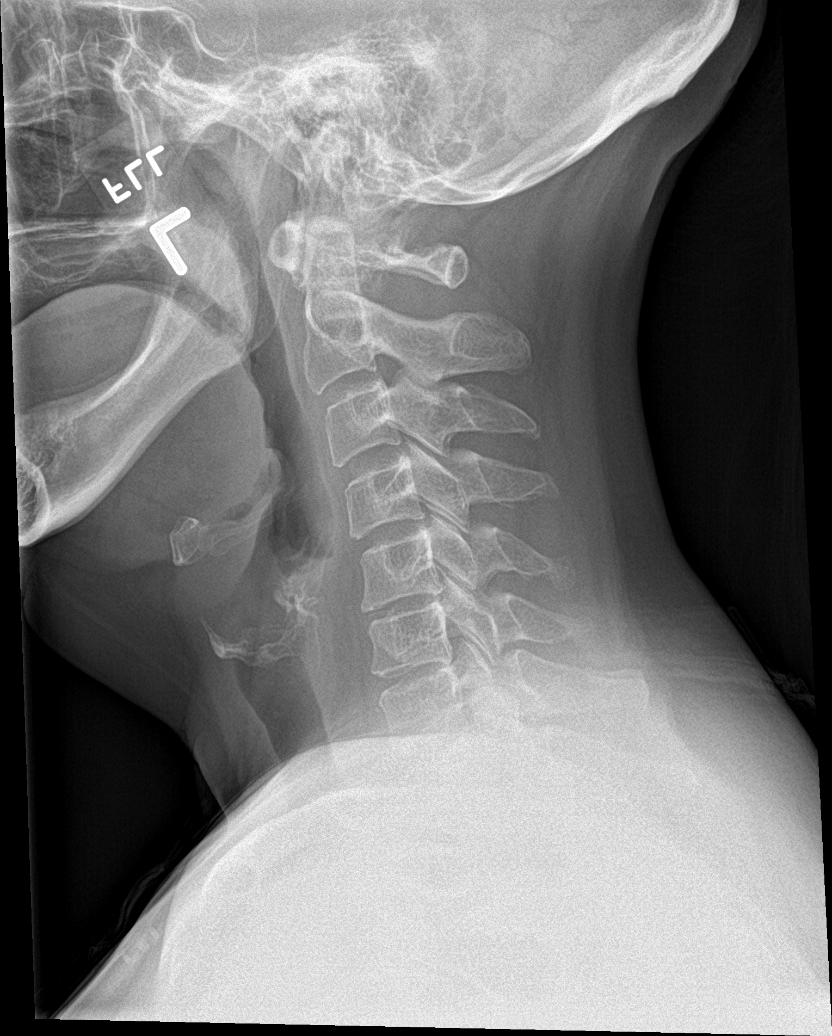

[c-spine obl (1 of 2)]
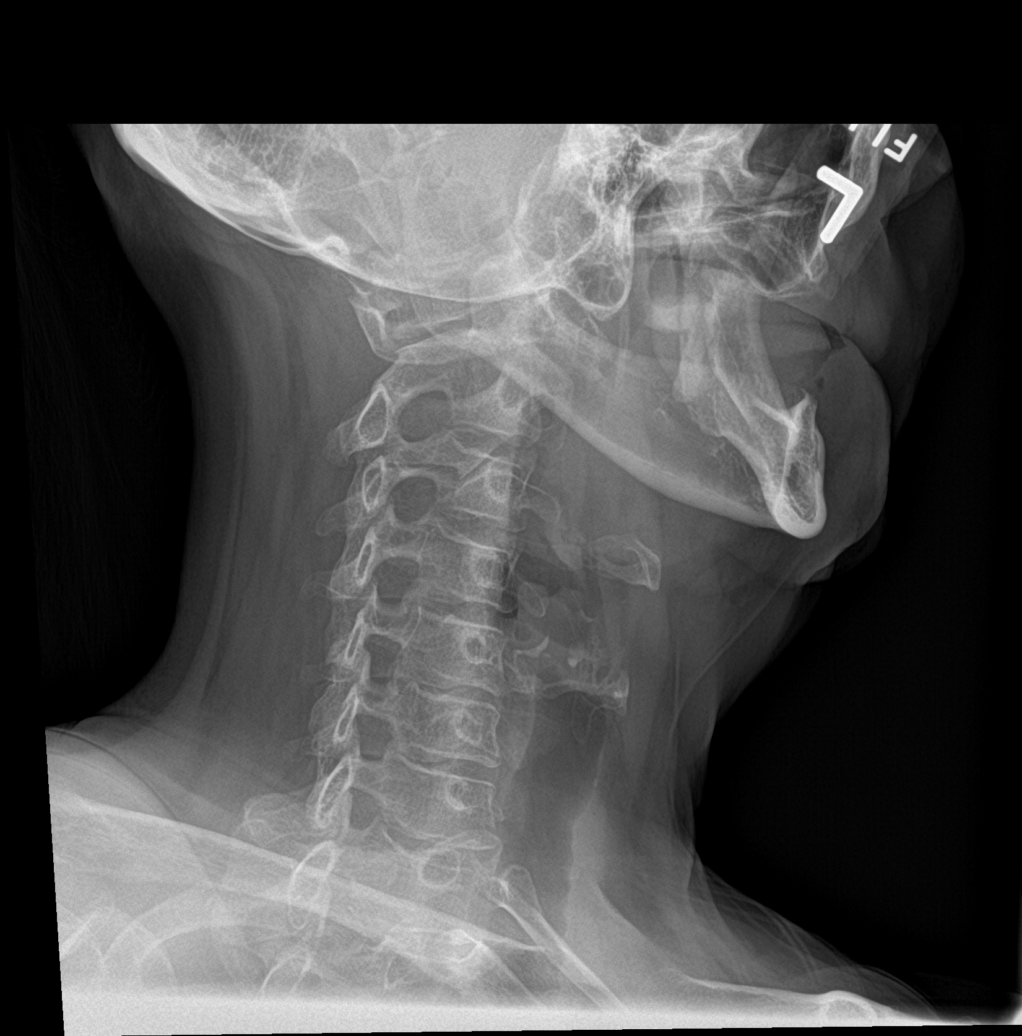

[c-spine ap]
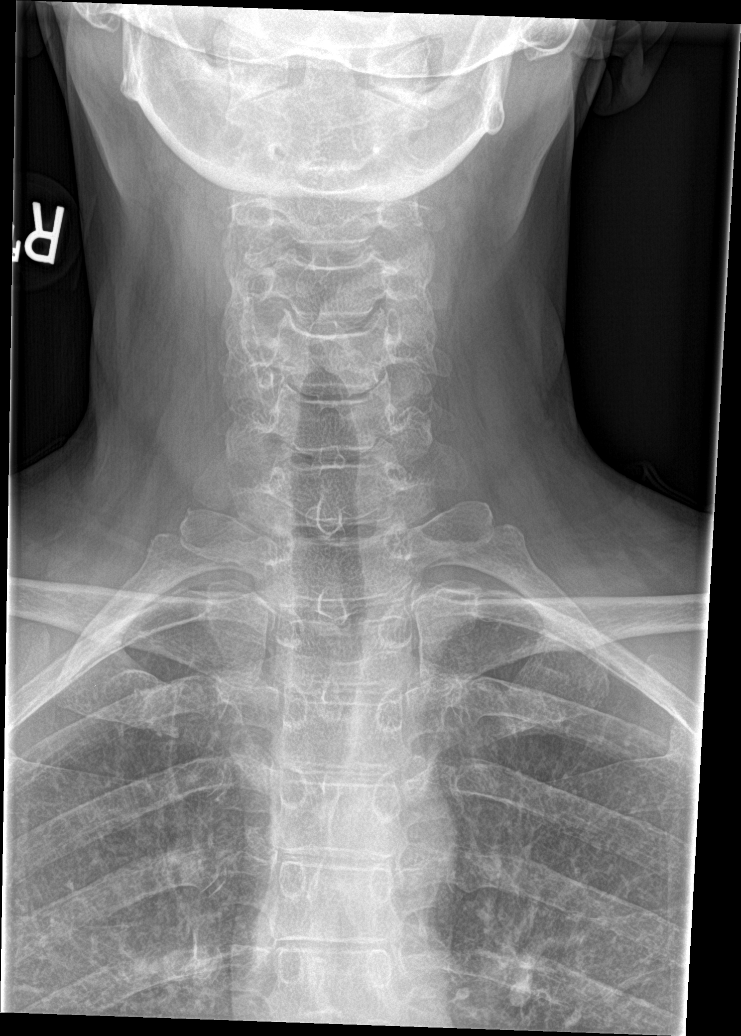

[c-spine open mouth]
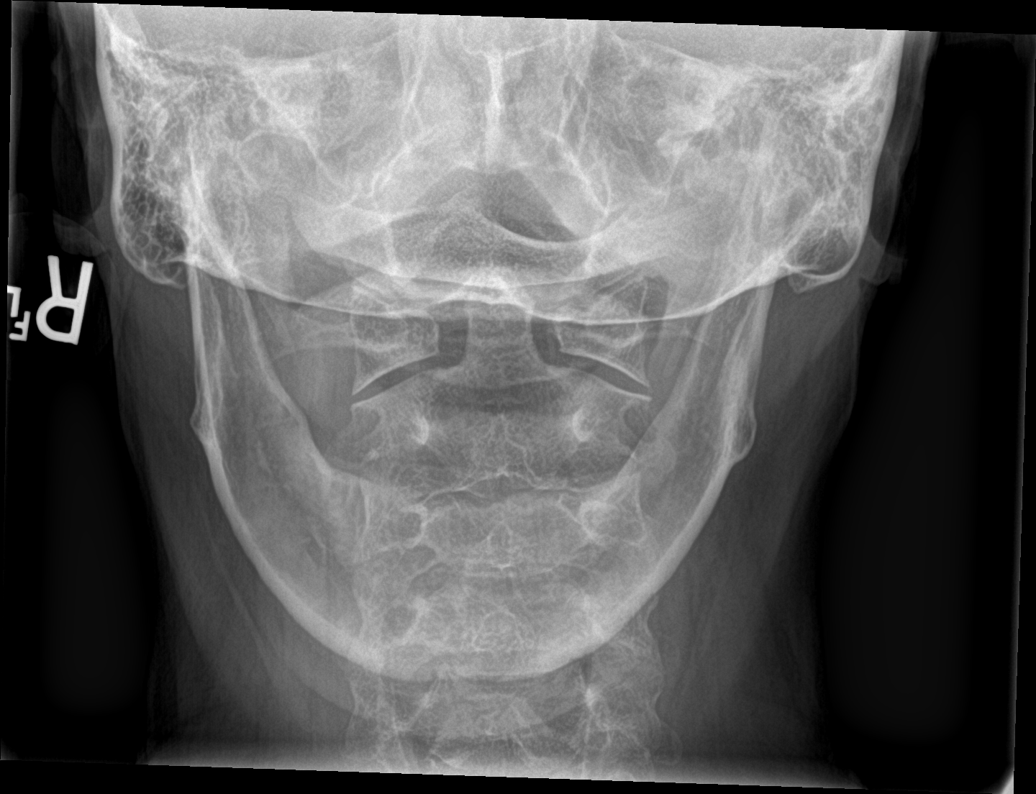

[c-spine obl (2 of 2)]
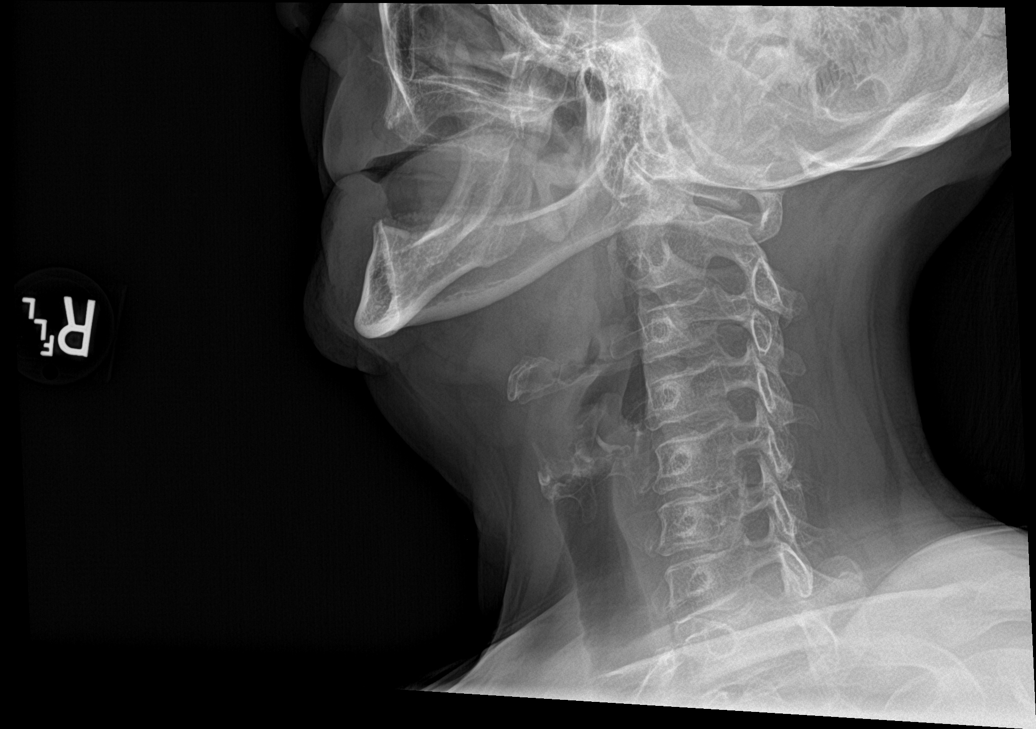

[5 of 5 positions shown; findings below may reference images not displayed]

FINDINGS: There is slight narrowing of the C5-6 and C6-7 disc spaces. There is
no facet arthritis or foraminal stenosis. Minimal uncinate spurs at
C5-6 bilaterally.

Diffuse osteopenia.  No prevertebral soft tissue swelling.
IMPRESSION: Minimal degenerative disc disease at C5-6 and C6-7.

## 2017-06-09 ENCOUNTER — Other Ambulatory Visit: Payer: Self-pay | Admitting: Physician Assistant

## 2017-06-09 DIAGNOSIS — F411 Generalized anxiety disorder: Secondary | ICD-10-CM

## 2017-06-10 NOTE — Telephone Encounter (Signed)
rx called into pharmacy

## 2017-06-10 NOTE — Telephone Encounter (Signed)
Last filled 05/09/17, last seen 03/22/17. Route to pool for call in

## 2017-06-25 ENCOUNTER — Ambulatory Visit (INDEPENDENT_AMBULATORY_CARE_PROVIDER_SITE_OTHER): Payer: No Typology Code available for payment source | Admitting: Physician Assistant

## 2017-06-25 ENCOUNTER — Encounter: Payer: Self-pay | Admitting: Physician Assistant

## 2017-06-25 DIAGNOSIS — G2581 Restless legs syndrome: Secondary | ICD-10-CM | POA: Diagnosis not present

## 2017-06-25 DIAGNOSIS — F331 Major depressive disorder, recurrent, moderate: Secondary | ICD-10-CM

## 2017-06-25 DIAGNOSIS — F411 Generalized anxiety disorder: Secondary | ICD-10-CM | POA: Diagnosis not present

## 2017-06-25 MED ORDER — BUSPIRONE HCL 10 MG PO TABS: 10.0000 mg | ORAL_TABLET | Freq: Three times a day (TID) | ORAL | 5 refills | Status: DC

## 2017-06-25 MED ORDER — ALPRAZOLAM 0.5 MG PO TABS: 0.5000 mg | ORAL_TABLET | Freq: Two times a day (BID) | ORAL | 2 refills | Status: DC | PRN

## 2017-06-25 MED ORDER — ALPRAZOLAM 0.5 MG PO TABS
0.5000 mg | ORAL_TABLET | Freq: Two times a day (BID) | ORAL | 2 refills | Status: DC | PRN
Start: 2017-06-25 — End: 2017-06-25

## 2017-06-25 MED ORDER — DULOXETINE HCL 60 MG PO CPEP: 120.0000 mg | ORAL_CAPSULE | Freq: Every day | ORAL | 2 refills | Status: DC

## 2017-06-25 MED ORDER — ROPINIROLE HCL 0.5 MG PO TABS: 0.5000 mg | ORAL_TABLET | Freq: Every day | ORAL | 5 refills | Status: DC

## 2017-06-25 MED ORDER — DOXEPIN HCL 25 MG PO CAPS: ORAL_CAPSULE | ORAL | 1 refills | Status: DC

## 2017-06-25 NOTE — Patient Instructions (Signed)
In a few days you may receive a survey in the mail or online from [employer] regarding your visit with us today. Please take a moment to fill this out. Your feedback is very important to our whole office. It can help us better understand your needs as well as improve your experience and satisfaction. Thank you for taking your time to complete it. We care about you.  Mckensie Scotti, PA-C  

## 2017-06-25 NOTE — Progress Notes (Signed)
BP 105/74   Pulse (!) 103   Temp (!) 95.8 F (35.4 C) (Oral)   Ht 5' 4.5" (1.638 m)   Wt 181 lb 6.4 oz (82.3 kg)   BMI 30.66 kg/m    Subjective:    Patient ID: Alicia Sanchez, female    DOB: 05/13/1961, 56 y.o.   MRN: [id number]  HPI: Alicia Sanchez is a 56 y.o. female presenting on 06/25/2017 for Follow-up (3 month ) This patient comes in for periodic recheck on medications and conditions including depression, insomnia, anxiety, restless legs.  She reports that overall she is doing better. Her pH Q score is lower. She will continue with the medication. She was started on Effexor in the past couple months. She is tolerating it well. She states that she is having some better sleep at this time. The restless legs is improving with the Requip. She does need refills on medications. There are lots of family and life stressors going on all around her.   All medications are reviewed today. There are no reports of any problems with the medications. All of the medical conditions are reviewed and updated.  Lab work is reviewed and will be ordered as medically necessary. There are no new problems reported with today's visit.   Depression screen [facility] 2/9 06/25/2017 03/22/2017 01/04/2017 12/14/2016  Decreased Interest 2 3 2 2   Down, Depressed, Hopeless 2 3 1 2   PHQ - 2 Score 4 6 3 4   Altered sleeping 1 0 3 2  Tired, decreased energy 3 3 2 2   Change in appetite 3 3 2  0  Feeling bad or failure about yourself  2 3 2  0  Trouble concentrating 1 2 2  0  Moving slowly or fidgety/restless 1 3 0 0  Suicidal thoughts 1 1 1  0  PHQ-9 Score 16 21 15 8     Relevant past medical, surgical, family and social history reviewed and updated as indicated. Allergies and medications reviewed and updated.  Past Medical History:  Diagnosis Date  . Anxiety   . COPD (chronic obstructive pulmonary disease) (HCC)     Past Surgical History:  Procedure Laterality Date  . ABDOMINAL HYSTERECTOMY      Review of Systems    Constitutional: Negative.  Negative for activity change, fatigue and fever.  HENT: Negative.   Eyes: Negative.   Respiratory: Negative.  Negative for cough.   Cardiovascular: Negative.  Negative for chest pain.  Gastrointestinal: Negative.  Negative for abdominal pain.  Endocrine: Negative.   Genitourinary: Negative.  Negative for dysuria.  Musculoskeletal: Negative.   Skin: Negative.   Neurological: Negative.     Allergies as of 06/25/2017   No Known Allergies     Medication List       Accurate as of 06/25/17 12:05 PM. Always use your most recent med list.          albuterol 108 (90 Base) MCG/ACT inhaler Commonly known as:  PROVENTIL HFA;VENTOLIN HFA Inhale 2 puffs into the lungs every 6 (six) hours as needed for wheezing or shortness of breath.   ALPRAZolam 0.5 MG tablet Commonly known as:  XANAX Take 1 tablet (0.5 mg total) by mouth 2 (two) times daily as needed for anxiety.   baclofen 20 MG tablet Commonly known as:  LIORESAL Take 20 mg by mouth 2 (two) times daily. ~not her rx, takes her spouse's rx~   busPIRone 10 MG tablet Commonly known as:  BUSPAR Take 1 tablet (10 mg total) by mouth 3 (three)  times daily.   doxepin 25 MG capsule Commonly known as:  SINEQUAN TAKE 1 TO 2 CAPSULES BY MOUTH AT BEDTIME   DULoxetine 60 MG capsule Commonly known as:  CYMBALTA Take 2 capsules (120 mg total) by mouth daily.   fluticasone furoate-vilanterol 100-25 MCG/INH Aepb Commonly known as:  BREO ELLIPTA Inhale 1 puff into the lungs daily.   multivitamin with minerals Tabs tablet Take 1 tablet by mouth daily.   rOPINIRole 0.5 MG tablet Commonly known as:  REQUIP Take 1-2 tablets (0.5-1 mg total) by mouth at bedtime.   umeclidinium bromide 62.5 MCG/INH Aepb Commonly known as:  INCRUSE ELLIPTA Inhale 1 puff into the lungs daily.          Objective:    BP 105/74   Pulse (!) 103   Temp (!) 95.8 F (35.4 C) (Oral)   Ht 5' 4.5" (1.638 m)   Wt 181 lb 6.4 oz (82.3  kg)   BMI 30.66 kg/m   No Known Allergies  Physical Exam  Constitutional: She is oriented to person, place, and time. She appears well-developed and well-nourished.  HENT:  Head: Normocephalic and atraumatic.  Eyes: Pupils are equal, round, and reactive to light. Conjunctivae and EOM are normal.  Cardiovascular: Normal rate, regular rhythm, normal heart sounds and intact distal pulses.   Pulmonary/Chest: Effort normal and breath sounds normal.  Abdominal: Soft. Bowel sounds are normal.  Neurological: She is alert and oriented to person, place, and time. She has normal reflexes.  Skin: Skin is warm and dry. No rash noted.  Psychiatric: She has a normal mood and affect. Her behavior is normal. Judgment and thought content normal.  Nursing note and vitals reviewed.       Assessment & Plan:   1. Restless legs - rOPINIRole (REQUIP) 0.5 MG tablet; Take 1-2 tablets (0.5-1 mg total) by mouth at bedtime.  Dispense: 60 tablet; Refill: 5  2. Moderate episode of recurrent major depressive disorder (HCC) - DULoxetine (CYMBALTA) 60 MG capsule; Take 2 capsules (120 mg total) by mouth daily.  Dispense: 60 capsule; Refill: 2 - doxepin (SINEQUAN) 25 MG capsule; TAKE 1 TO 2 CAPSULES BY MOUTH AT BEDTIME  Dispense: 180 capsule; Refill: 1  3. GAD (generalized anxiety disorder) - busPIRone (BUSPAR) 10 MG tablet; Take 1 tablet (10 mg total) by mouth 3 (three) times daily.  Dispense: 90 tablet; Refill: 5  4. Generalized anxiety disorder - ALPRAZolam (XANAX) 0.5 MG tablet; Take 1 tablet (0.5 mg total) by mouth 2 (two) times daily as needed for anxiety.  Dispense: 60 tablet; Refill: 2    Current Outpatient Prescriptions:  .  albuterol (PROVENTIL HFA;VENTOLIN HFA) 108 (90 Base) MCG/ACT inhaler, Inhale 2 puffs into the lungs every 6 (six) hours as needed for wheezing or shortness of breath., Disp: 1 Inhaler, Rfl: 6 .  ALPRAZolam (XANAX) 0.5 MG tablet, Take 1 tablet (0.5 mg total) by mouth 2 (two) times  daily as needed for anxiety., Disp: 60 tablet, Rfl: 2 .  baclofen (LIORESAL) 20 MG tablet, Take 20 mg by mouth 2 (two) times daily. ~not her rx, takes her spouse's rx~, Disp: , Rfl:  .  busPIRone (BUSPAR) 10 MG tablet, Take 1 tablet (10 mg total) by mouth 3 (three) times daily., Disp: 90 tablet, Rfl: 5 .  doxepin (SINEQUAN) 25 MG capsule, TAKE 1 TO 2 CAPSULES BY MOUTH AT BEDTIME, Disp: 180 capsule, Rfl: 1 .  DULoxetine (CYMBALTA) 60 MG capsule, Take 2 capsules (120 mg total) by mouth daily.,  Disp: 60 capsule, Rfl: 2 .  fluticasone furoate-vilanterol (BREO ELLIPTA) 100-25 MCG/INH AEPB, Inhale 1 puff into the lungs daily., Disp: 1 each, Rfl: 6 .  Multiple Vitamin (MULTIVITAMIN WITH MINERALS) TABS tablet, Take 1 tablet by mouth daily., Disp: , Rfl:  .  rOPINIRole (REQUIP) 0.5 MG tablet, Take 1-2 tablets (0.5-1 mg total) by mouth at bedtime., Disp: 60 tablet, Rfl: 5 .  umeclidinium bromide (INCRUSE ELLIPTA) 62.5 MCG/INH AEPB, Inhale 1 puff into the lungs daily., Disp: 30 each, Rfl: 11 Continue all other maintenance medications as listed above.  Follow up plan: Return in about 3 months (around 09/25/2017) for recheck.  Educational handout given for survey  Remus Loffler PA-C [facility] [address]  [place], [place] [address] [phone]   06/25/2017, 12:05 PM

## 2017-07-24 ENCOUNTER — Ambulatory Visit (INDEPENDENT_AMBULATORY_CARE_PROVIDER_SITE_OTHER): Payer: No Typology Code available for payment source | Admitting: Pulmonary Disease

## 2017-07-24 ENCOUNTER — Encounter: Payer: Self-pay | Admitting: Pulmonary Disease

## 2017-07-24 VITALS — BP 114/78 | HR 89 | Ht 64.0 in | Wt 184.0 lb

## 2017-07-24 DIAGNOSIS — J449 Chronic obstructive pulmonary disease, unspecified: Secondary | ICD-10-CM

## 2017-07-24 DIAGNOSIS — G2581 Restless legs syndrome: Secondary | ICD-10-CM | POA: Diagnosis not present

## 2017-07-24 DIAGNOSIS — F17210 Nicotine dependence, cigarettes, uncomplicated: Secondary | ICD-10-CM | POA: Diagnosis not present

## 2017-07-24 LAB — PULMONARY FUNCTION TEST
DL/VA % PRED: 65 %
DL/VA: 3.13 ml/min/mmHg/L
DLCO COR: 14.82 ml/min/mmHg
DLCO UNC % PRED: 64 %
DLCO cor % pred: 61 %
DLCO unc: 15.61 ml/min/mmHg
FEF 25-75 Post: 0.88 L/sec
FEF 25-75 Pre: 0.67 L/sec
FEF2575-%Change-Post: 30 %
FEF2575-%PRED-POST: 35 %
FEF2575-%Pred-Pre: 27 %
FEV1-%CHANGE-POST: 10 %
FEV1-%PRED-PRE: 53 %
FEV1-%Pred-Post: 58 %
FEV1-POST: 1.55 L
FEV1-Pre: 1.41 L
FEV1FVC-%Change-Post: 2 %
FEV1FVC-%Pred-Pre: 75 %
FEV6-%Change-Post: 6 %
FEV6-%PRED-POST: 75 %
FEV6-%Pred-Pre: 70 %
FEV6-POST: 2.46 L
FEV6-PRE: 2.3 L
FEV6FVC-%CHANGE-POST: -1 %
FEV6FVC-%PRED-PRE: 101 %
FEV6FVC-%Pred-Post: 99 %
FVC-%Change-Post: 7 %
FVC-%PRED-POST: 74 %
FVC-%PRED-PRE: 69 %
FVC-POST: 2.54 L
FVC-Pre: 2.36 L
PRE FEV6/FVC RATIO: 99 %
Post FEV1/FVC ratio: 61 %
Post FEV6/FVC ratio: 97 %
Pre FEV1/FVC ratio: 60 %
RV % PRED: 146 %
RV: 2.81 L
TLC % PRED: 103 %
TLC: 5.22 L

## 2017-07-24 NOTE — Progress Notes (Signed)
Lynnell JudeBillie Speros    [id number]    05/25/1961  Primary Care Physician:Jones, Wayland SalinasAngel S, PA-C  Referring Physician: Remus LofflerJones, Angel S, PA-C [address] [place], [place] [id number]  Chief complaint:   Follow-up for COPD  HPI: [name] is a 56 year old active smoker with history of anxiety, depression, COPD. She was diagnosed with COPD several years ago but does not have any PFTs on record. She was started on breo and incruse in January 2018 by her primary care physician. She reports daily symptoms of dyspnea with activity, productive cough with white mucus production, chest congestion. She also symptoms of fatigue, exertion, anxiety  She has daily symptoms of snoring. She does sleep study 4 years ago but does not know the result of this test. She is a diagnosis of restless leg syndrome and is on requip  Pets:Dog which lives outside the house. No pets, exotic pets. Occupation:Customer sevice rep. previously worked d in [employer] Exposures: His current dust exposure and previous exposure to cotton fiber in her line of work Smoking history: 35-pack-year smoking history. Continues to smoke 1 pack per day  Interim history: She did not have the sleep study done as it was not covered by insurance. Continues to have dyspnea on exertion. Denies any cough, sputum production, wheezing, fevers, chills.  Outpatient Encounter Prescriptions as of 07/24/2017  Medication Sig  . albuterol (PROVENTIL HFA;VENTOLIN HFA) 108 (90 Base) MCG/ACT inhaler Inhale 2 puffs into the lungs every 6 (six) hours as needed for wheezing or shortness of breath.  . ALPRAZolam (XANAX) 0.5 MG tablet Take 1 tablet (0.5 mg total) by mouth 2 (two) times daily as needed for anxiety.  . baclofen (LIORESAL) 20 MG tablet Take 20 mg by mouth 2 (two) times daily. ~not her rx, takes her spouse's rx~  . busPIRone (BUSPAR) 10 MG tablet Take 1 tablet (10 mg total) by mouth 3 (three) times daily.  Marland Kitchen. doxepin (SINEQUAN) 25 MG capsule TAKE  1 TO 2 CAPSULES BY MOUTH AT BEDTIME  . DULoxetine (CYMBALTA) 60 MG capsule Take 2 capsules (120 mg total) by mouth daily.  . fluticasone furoate-vilanterol (BREO ELLIPTA) 100-25 MCG/INH AEPB Inhale 1 puff into the lungs daily.  . Multiple Vitamin (MULTIVITAMIN WITH MINERALS) TABS tablet Take 1 tablet by mouth daily.  Marland Kitchen. rOPINIRole (REQUIP) 0.5 MG tablet Take 1-2 tablets (0.5-1 mg total) by mouth at bedtime.  Marland Kitchen. umeclidinium bromide (INCRUSE ELLIPTA) 62.5 MCG/INH AEPB Inhale 1 puff into the lungs daily.   No facility-administered encounter medications on file as of 07/24/2017.     Allergies as of 07/24/2017  . (No Known Allergies)    Past Medical History:  Diagnosis Date  . Anxiety   . COPD (chronic obstructive pulmonary disease) (HCC)     Past Surgical History:  Procedure Laterality Date  . ABDOMINAL HYSTERECTOMY      Family History  Problem Relation Age of Onset  . COPD Mother   . COPD Father   . Diabetes type II Sister   . Diabetes type II Brother   . COPD Maternal Grandfather     Social History   Social History  . Marital status: Married    Spouse name: N/A  . Number of children: N/A  . Years of education: N/A   Occupational History  . Not on file.   Social History Main Topics  . Smoking status: Current Every Day Smoker    Packs/day: 1.00    Years: 42.00  Types: Cigarettes  . Smokeless tobacco: Never Used  . Alcohol use No  . Drug use: No  . Sexual activity: Yes    Birth control/ protection: Surgical   Other Topics Concern  . Not on file   Social History Narrative  . No narrative on file    Review of systems: Review of Systems  Constitutional: Negative for fever and chills.  HENT: Negative.   Eyes: Negative for blurred vision.  Respiratory: as per HPI  Cardiovascular: Negative for chest pain and palpitations.  Gastrointestinal: Negative for vomiting, diarrhea, blood per rectum. Genitourinary: Negative for dysuria, urgency, frequency and  hematuria.  Musculoskeletal: Negative for myalgias, back pain and joint pain.  Skin: Negative for itching and rash.  Neurological: Negative for dizziness, tremors, focal weakness, seizures and loss of consciousness.  Endo/Heme/Allergies: Negative for environmental allergies.  Psychiatric/Behavioral: Negative for depression, suicidal ideas and hallucinations.  All other systems reviewed and are negative.  Physical Exam: Blood pressure 114/78, pulse 89, height 5\' 4"  (1.626 m), weight 184 lb (83.5 kg), SpO2 98 %. Gen:      No acute distress HEENT:  EOMI, sclera anicteric Neck:     No masses; no thyromegaly Lungs:    Clear to auscultation bilaterally; normal respiratory effort CV:         Regular rate and rhythm; no murmurs Abd:      + bowel sounds; soft, non-tender; no palpable masses, no distension Ext:    No edema; adequate peripheral perfusion Skin:      Warm and dry; no rash Neuro: alert and oriented x 3 Psych: normal mood and affect  Data Reviewed: Chest x-ray 07/22/15-interstitial marking consistent with bronchitis Chest x-ray 11/19/15-chronic interstitial marking Chest x-ray 11/20/15-chronic interstitial marking. Stable compared to prior Chest x-ray 04/25/17-hyperinflation, chronic interstitial marking. No acute process. Screening CT chest 05/08/17-centrilobular emphysema, calcified granuloma, tiny subcentimeter pulmonary nodules. I have reviewed all images personally  PFTs 9/5/1 8 FVC 2.54 (74%], FEV1 1.55 (58%], F/F 61, TLC 103%, DLCO 64% Moderate obstruction and diffusion impairment with air trapping  Assessment:  COPD, chronic bronchitis PFTs reviewed with confirmed COPD with moderate obstruction. She is on good inhaler regimen with breo and incruse and will continue the same.    Active smoker She continues to smoke 1 pack per day. I have discussed smoking cessation with her and she reports that nicotine patches has not worked in the past. Would like to avoid Chantix or  Wellbutrin given her psychiatric history. She wants to quit on her own. Time spent counseling-5 minutes Continue screening CTs of the chest.  Restless leg syndrome, suspected sleep apnea On requip for restless leg syndrome.  Sleep study not done as it is not covered by insurance.  Plan/Recommendations: - Continue breo and incruse - Annual screening CT of chest - Smoking cessation.  Chilton Greathouse MD [facility] Pager [phone] 07/24/2017, 10:35 AM  CC: Remus Loffler, PA-C

## 2017-07-24 NOTE — Patient Instructions (Signed)
Continue using your Inhalers as prescribed and please work on quitting smoking I have reviewed your lung function test and CT scan with you today Return to clinic in 4 months.

## 2017-07-24 NOTE — Progress Notes (Signed)
PFT done today. 

## 2017-08-07 ENCOUNTER — Ambulatory Visit (INDEPENDENT_AMBULATORY_CARE_PROVIDER_SITE_OTHER): Payer: No Typology Code available for payment source | Admitting: Physician Assistant

## 2017-08-07 ENCOUNTER — Encounter: Payer: Self-pay | Admitting: Physician Assistant

## 2017-08-07 VITALS — BP 117/70 | HR 117 | Temp 99.2°F | Ht 64.0 in | Wt 184.8 lb

## 2017-08-07 DIAGNOSIS — N3001 Acute cystitis with hematuria: Secondary | ICD-10-CM | POA: Diagnosis not present

## 2017-08-07 DIAGNOSIS — R300 Dysuria: Secondary | ICD-10-CM | POA: Diagnosis not present

## 2017-08-07 LAB — MICROSCOPIC EXAMINATION
Epithelial Cells (non renal): >10 /hpf — AB (ref 0–10)
RENAL EPITHEL UA: NONE SEEN /HPF

## 2017-08-07 LAB — URINALYSIS, COMPLETE
BILIRUBIN UA: NEGATIVE
Glucose, UA: NEGATIVE
Ketones, UA: NEGATIVE
Nitrite, UA: NEGATIVE
PH UA: 7.5 (ref 5.0–7.5)
SPEC GRAV UA: 1.015 (ref 1.005–1.030)
UUROB: 0.2 mg/dL (ref 0.2–1.0)

## 2017-08-07 MED ORDER — PHENAZOPYRIDINE HCL 100 MG PO TABS: 100.0000 mg | ORAL_TABLET | Freq: Three times a day (TID) | ORAL | 0 refills | Status: DC | PRN

## 2017-08-07 MED ORDER — NITROFURANTOIN MONOHYD MACRO 100 MG PO CAPS: 100.0000 mg | ORAL_CAPSULE | Freq: Two times a day (BID) | ORAL | 0 refills | Status: DC

## 2017-08-07 NOTE — Progress Notes (Signed)
BP 117/70   Pulse (!) 117   Temp 99.2 F (37.3 C) (Oral)   Ht  (1.626 m)   Wt 184 lb 12.8 oz (83.8 kg)   BMI 31.72 kg/m    Subjective:    Patient ID: Alicia Sanchez, female    DOB: 23-Mar-1961, 56 y.o.   MRN: [id number]  HPI: Alicia Sanchez is a 56 y.o. female presenting on 08/07/2017 for Dysuria (pain,burning, scant amounts)  This patient has had several days of dysuria, frequency and nocturia. There is also pain over the bladder in the suprapubic region, no back pain. Denies leakage or hematuria. No pain in flank area. Positive for fever and chills in the past 2 days. She does have a history of urinary tract infections. The patient does have chronic kidney disease.  Relevant past medical, surgical, family and social history reviewed and updated as indicated. Allergies and medications reviewed and updated.  Past Medical History:  Diagnosis Date  . Anxiety   . COPD (chronic obstructive pulmonary disease) (HCC)     Past Surgical History:  Procedure Laterality Date  . ABDOMINAL HYSTERECTOMY      Review of Systems  Constitutional: Positive for chills and fever.  HENT: Negative.   Eyes: Negative.   Respiratory: Negative.   Gastrointestinal: Negative.   Genitourinary: Positive for difficulty urinating, dysuria, frequency and urgency. Negative for flank pain.    Allergies as of 08/07/2017   No Known Allergies     Medication List       Accurate as of 08/07/17 12:37 PM. Always use your most recent med list.          albuterol 108 (90 Base) MCG/ACT inhaler Commonly known as:  PROVENTIL HFA;VENTOLIN HFA Inhale 2 puffs into the lungs every 6 (six) hours as needed for wheezing or shortness of breath.   ALPRAZolam 0.5 MG tablet Commonly known as:  XANAX Take 1 tablet (0.5 mg total) by mouth 2 (two) times daily as needed for anxiety.   baclofen 20 MG tablet Commonly known as:  LIORESAL Take 20 mg by mouth 2 (two) times daily. ~not her rx, takes her spouse's rx~     busPIRone 10 MG tablet Commonly known as:  BUSPAR Take 1 tablet (10 mg total) by mouth 3 (three) times daily.   doxepin 25 MG capsule Commonly known as:  SINEQUAN TAKE 1 TO 2 CAPSULES BY MOUTH AT BEDTIME   DULoxetine 60 MG capsule Commonly known as:  CYMBALTA Take 2 capsules (120 mg total) by mouth daily.   fluticasone furoate-vilanterol 100-25 MCG/INH Aepb Commonly known as:  BREO ELLIPTA Inhale 1 puff into the lungs daily.   multivitamin with minerals Tabs tablet Take 1 tablet by mouth daily.   nitrofurantoin (macrocrystal-monohydrate) 100 MG capsule Commonly known as:  MACROBID Take 1 capsule (100 mg total) by mouth 2 (two) times daily. 1 po BId   phenazopyridine 100 MG tablet Commonly known as:  PYRIDIUM Take 1 tablet (100 mg total) by mouth 3 (three) times daily as needed for pain.   rOPINIRole 0.5 MG tablet Commonly known as:  REQUIP Take 1-2 tablets (0.5-1 mg total) by mouth at bedtime.   umeclidinium bromide 62.5 MCG/INH Aepb Commonly known as:  INCRUSE ELLIPTA Inhale 1 puff into the lungs daily.            Discharge Care Instructions        Start     Ordered   08/07/17 0000  Urinalysis, Complete     08/07/17  1211   08/07/17 0000  nitrofurantoin, macrocrystal-monohydrate, (MACROBID) 100 MG capsule  2 times daily    Question:  Supervising Provider  Answer:  Elenora Gamma   08/07/17 1222   08/07/17 0000  phenazopyridine (PYRIDIUM) 100 MG tablet  3 times daily PRN    Question:  Supervising Provider  Answer:  Elenora Gamma   08/07/17 1222   08/07/17 0000  Urine Culture     08/07/17 1226         Objective:    BP 117/70   Pulse (!) 117   Temp 99.2 F (37.3 C) (Oral)   Ht  (1.626 m)   Wt 184 lb 12.8 oz (83.8 kg)   BMI 31.72 kg/m   No Known Allergies  Physical Exam  Constitutional: She is oriented to person, place, and time. She appears well-developed and well-nourished.  HENT:  Head: Normocephalic and atraumatic.  Eyes:  Pupils are equal, round, and reactive to light. Conjunctivae are normal.  Cardiovascular: Normal rate, regular rhythm, normal heart sounds and intact distal pulses.   Pulmonary/Chest: Effort normal and breath sounds normal.  Abdominal: Soft. Bowel sounds are normal. She exhibits no distension and no mass. There is tenderness in the suprapubic area. There is no rebound, no guarding and no CVA tenderness.  Neurological: She is alert and oriented to person, place, and time. She has normal reflexes.  Skin: Skin is warm and dry. No rash noted.  Psychiatric: She has a normal mood and affect. Her behavior is normal. Judgment and thought content normal.        Assessment & Plan:   1. Dysuria - Urinalysis, Complete  2. Acute cystitis with hematuria - nitrofurantoin, macrocrystal-monohydrate, (MACROBID) 100 MG capsule; Take 1 capsule (100 mg total) by mouth 2 (two) times daily. 1 po BId  Dispense: 20 capsule; Refill: 0 - phenazopyridine (PYRIDIUM) 100 MG tablet; Take 1 tablet (100 mg total) by mouth 3 (three) times daily as needed for pain.  Dispense: 10 tablet; Refill: 0 - Urine Culture    Current Outpatient Prescriptions:  .  albuterol (PROVENTIL HFA;VENTOLIN HFA) 108 (90 Base) MCG/ACT inhaler, Inhale 2 puffs into the lungs every 6 (six) hours as needed for wheezing or shortness of breath., Disp: 1 Inhaler, Rfl: 6 .  ALPRAZolam (XANAX) 0.5 MG tablet, Take 1 tablet (0.5 mg total) by mouth 2 (two) times daily as needed for anxiety., Disp: 60 tablet, Rfl: 2 .  baclofen (LIORESAL) 20 MG tablet, Take 20 mg by mouth 2 (two) times daily. ~not her rx, takes her spouse's rx~, Disp: , Rfl:  .  busPIRone (BUSPAR) 10 MG tablet, Take 1 tablet (10 mg total) by mouth 3 (three) times daily., Disp: 90 tablet, Rfl: 5 .  doxepin (SINEQUAN) 25 MG capsule, TAKE 1 TO 2 CAPSULES BY MOUTH AT BEDTIME, Disp: 180 capsule, Rfl: 1 .  DULoxetine (CYMBALTA) 60 MG capsule, Take 2 capsules (120 mg total) by mouth daily., Disp:  60 capsule, Rfl: 2 .  fluticasone furoate-vilanterol (BREO ELLIPTA) 100-25 MCG/INH AEPB, Inhale 1 puff into the lungs daily., Disp: 1 each, Rfl: 6 .  Multiple Vitamin (MULTIVITAMIN WITH MINERALS) TABS tablet, Take 1 tablet by mouth daily., Disp: , Rfl:  .  rOPINIRole (REQUIP) 0.5 MG tablet, Take 1-2 tablets (0.5-1 mg total) by mouth at bedtime., Disp: 60 tablet, Rfl: 5 .  umeclidinium bromide (INCRUSE ELLIPTA) 62.5 MCG/INH AEPB, Inhale 1 puff into the lungs daily., Disp: 30 each, Rfl: 11 .  nitrofurantoin, macrocrystal-monohydrate, (MACROBID)  100 MG capsule, Take 1 capsule (100 mg total) by mouth 2 (two) times daily. 1 po BId, Disp: 20 capsule, Rfl: 0 .  phenazopyridine (PYRIDIUM) 100 MG tablet, Take 1 tablet (100 mg total) by mouth 3 (three) times daily as needed for pain., Disp: 10 tablet, Rfl: 0 Continue all other maintenance medications as listed above.  Follow up plan: Return if symptoms worsen or fail to improve.  Educational handout given for survey  Remus Loffler PA-C [facility] [address]  [place], [place] [address] [phone]   08/07/2017, 12:37 PM

## 2017-08-07 NOTE — Patient Instructions (Signed)
In a few days you may receive a survey in the mail or online from [employer] regarding your visit with us today. Please take a moment to fill this out. Your feedback is very important to our whole office. It can help us better understand your needs as well as improve your experience and satisfaction. Thank you for taking your time to complete it. We care about you.  Patrisia Faeth, PA-C  

## 2017-08-09 ENCOUNTER — Telehealth: Payer: Self-pay | Admitting: Physician Assistant

## 2017-08-09 LAB — URINE CULTURE

## 2017-08-09 NOTE — Telephone Encounter (Signed)
That is okay, I had tried to get her to take longer to begin with.

## 2017-08-09 NOTE — Telephone Encounter (Signed)
Patient aware that note is up front to be picked up. 

## 2017-08-13 ENCOUNTER — Ambulatory Visit: Payer: No Typology Code available for payment source | Admitting: Physician Assistant

## 2017-08-21 ENCOUNTER — Encounter: Payer: Self-pay | Admitting: Physician Assistant

## 2017-08-21 ENCOUNTER — Ambulatory Visit (INDEPENDENT_AMBULATORY_CARE_PROVIDER_SITE_OTHER): Payer: No Typology Code available for payment source | Admitting: Physician Assistant

## 2017-08-21 VITALS — BP 115/80 | HR 108 | Temp 97.1°F | Ht 64.0 in | Wt 184.8 lb

## 2017-08-21 DIAGNOSIS — N3001 Acute cystitis with hematuria: Secondary | ICD-10-CM | POA: Insufficient documentation

## 2017-08-21 DIAGNOSIS — N390 Urinary tract infection, site not specified: Secondary | ICD-10-CM | POA: Insufficient documentation

## 2017-08-21 DIAGNOSIS — J439 Emphysema, unspecified: Secondary | ICD-10-CM | POA: Insufficient documentation

## 2017-08-21 DIAGNOSIS — R300 Dysuria: Secondary | ICD-10-CM

## 2017-08-21 DIAGNOSIS — R918 Other nonspecific abnormal finding of lung field: Secondary | ICD-10-CM

## 2017-08-21 NOTE — Patient Instructions (Signed)
In a few days you may receive a survey in the mail or online from [employer] regarding your visit with us today. Please take a moment to fill this out. Your feedback is very important to our whole office. It can help us better understand your needs as well as improve your experience and satisfaction. Thank you for taking your time to complete it. We care about you.  Oslo Huntsman, PA-C  

## 2017-08-21 NOTE — Progress Notes (Signed)
BP 115/80   Pulse (!) 108   Temp (!) 97.1 F (36.2 C) (Oral)   Ht  (1.626 m)   Wt 184 lb 12.8 oz (83.8 kg)   SpO2 95%   BMI 31.72 kg/m    Subjective:    Patient ID: Alicia Sanchez, female    DOB: 01-02-1961, 56 y.o.   MRN: [id number]  HPI: [name] is a 56 y.o. female presenting on 08/21/2017 for Urinary Tract Infection and Wheezing  Patient was seen at urgent care 2 days ago and started on Cipro. This makes to urinary tract infections in the past month. Ears ago she had this problem also. At that time she had problems with the urethra, she had to have it dilated. Due to the number of infections that she's had over the past year we are can have her see a urologist again. Culture is pending at [facility] urgent care [place].  It is of note her last urinary tract infection was sensitive to Macrobid. This was and chronic use. However in just a few days she had an infection again.  Patient recently had a chest CT that showed emphysema and pulmonary nodules. The largest one was 1.8 mm. The recommendation was to have a recheck in 12 months. Her daughter was here with her and they are concerned about this being too long for her to be checked again. We will make a referral to pulmonology.  Relevant past medical, surgical, family and social history reviewed and updated as indicated. Allergies and medications reviewed and updated.  Past Medical History:  Diagnosis Date  . Anxiety   . COPD (chronic obstructive pulmonary disease) (HCC)     Past Surgical History:  Procedure Laterality Date  . ABDOMINAL HYSTERECTOMY      Review of Systems  Constitutional: Negative.   HENT: Negative.   Eyes: Negative.   Respiratory: Positive for cough and shortness of breath. Negative for wheezing.   Gastrointestinal: Negative.   Genitourinary: Positive for difficulty urinating, dysuria and urgency. Negative for flank pain.    Allergies as of 08/21/2017   No Known Allergies     Medication List       Accurate as of 08/21/17 11:36 AM. Always use your most recent med list.          albuterol 108 (90 Base) MCG/ACT inhaler Commonly known as:  PROVENTIL HFA;VENTOLIN HFA Inhale 2 puffs into the lungs every 6 (six) hours as needed for wheezing or shortness of breath.   ALPRAZolam 0.5 MG tablet Commonly known as:  XANAX Take 1 tablet (0.5 mg total) by mouth 2 (two) times daily as needed for anxiety.   baclofen 20 MG tablet Commonly known as:  LIORESAL Take 20 mg by mouth 2 (two) times daily. ~not her rx, takes her spouse's rx~   busPIRone 10 MG tablet Commonly known as:  BUSPAR Take 1 tablet (10 mg total) by mouth 3 (three) times daily.   doxepin 25 MG capsule Commonly known as:  SINEQUAN TAKE 1 TO 2 CAPSULES BY MOUTH AT BEDTIME   DULoxetine 60 MG capsule Commonly known as:  CYMBALTA Take 2 capsules (120 mg total) by mouth daily.   fluticasone furoate-vilanterol 100-25 MCG/INH Aepb Commonly known as:  BREO ELLIPTA Inhale 1 puff into the lungs daily.   multivitamin with minerals Tabs tablet Take 1 tablet by mouth daily.   nitrofurantoin (macrocrystal-monohydrate) 100 MG capsule Commonly known as:  MACROBID Take 1 capsule (100 mg total) by mouth 2 (two) times  daily. 1 po BId   phenazopyridine 100 MG tablet Commonly known as:  PYRIDIUM Take 1 tablet (100 mg total) by mouth 3 (three) times daily as needed for pain.   rOPINIRole 0.5 MG tablet Commonly known as:  REQUIP Take 1-2 tablets (0.5-1 mg total) by mouth at bedtime.   umeclidinium bromide 62.5 MCG/INH Aepb Commonly known as:  INCRUSE ELLIPTA Inhale 1 puff into the lungs daily.          Objective:    BP 115/80   Pulse (!) 108   Temp (!) 97.1 F (36.2 C) (Oral)   Ht  (1.626 m)   Wt 184 lb 12.8 oz (83.8 kg)   SpO2 95%   BMI 31.72 kg/m   No Known Allergies  Physical Exam  Constitutional: She is oriented to person, place, and time. She appears well-developed and well-nourished.  HENT:  Head:  Normocephalic and atraumatic.  Eyes: Pupils are equal, round, and reactive to light. Conjunctivae are normal.  Cardiovascular: Normal rate, regular rhythm, normal heart sounds and intact distal pulses.   Pulmonary/Chest: Effort normal and breath sounds normal. No respiratory distress. She has no wheezes.  Abdominal: Soft. Bowel sounds are normal. She exhibits no distension and no mass. There is tenderness in the suprapubic area. There is no rebound, no guarding and no CVA tenderness.  Neurological: She is alert and oriented to person, place, and time. She has normal reflexes.  Skin: Skin is warm and dry. No rash noted.  Psychiatric: She has a normal mood and affect. Her behavior is normal. Judgment and thought content normal.    Results for orders placed or performed in visit on 08/07/17  Urine Culture  Result Value Ref Range   Urine Culture, Routine Final report (A)    Organism ID, Bacteria Escherichia coli (A)    Antimicrobial Susceptibility Comment   Microscopic Examination  Result Value Ref Range   WBC, UA >30 (A) 0 - 5 /hpf   RBC, UA 3-10 (A) 0 - 2 /hpf   Epithelial Cells (non renal) >10 (A) 0 - 10 /hpf   Renal Epithel, UA None seen None seen /hpf   Mucus, UA Present Not Estab.   Bacteria, UA Moderate (A) None seen/Few  Urinalysis, Complete  Result Value Ref Range   Specific Gravity, UA 1.015 1.005 - 1.030   pH, UA 7.5 5.0 - 7.5   Color, UA Yellow Yellow   Appearance Ur Cloudy (A) Clear   Leukocytes, UA 3+ (A) Negative   Protein, UA 2+ (A) Negative/Trace   Glucose, UA Negative Negative   Ketones, UA Negative Negative   RBC, UA 3+ (A) Negative   Bilirubin, UA Negative Negative   Urobilinogen, Ur 0.2 0.2 - 1.0 mg/dL   Nitrite, UA Negative Negative   Microscopic Examination See below:       Assessment & Plan:   1. Dysuria  2. Acute cystitis with hematuria - Ambulatory referral to Urology  3. Lung nodules - Ambulatory referral to Pulmonology  4. Pulmonary  emphysema, unspecified emphysema type (HCC) - Ambulatory referral to Pulmonology  5. Recurrent UTI - Ambulatory referral to Urology    Current Outpatient Prescriptions:  .  albuterol (PROVENTIL HFA;VENTOLIN HFA) 108 (90 Base) MCG/ACT inhaler, Inhale 2 puffs into the lungs every 6 (six) hours as needed for wheezing or shortness of breath., Disp: 1 Inhaler, Rfl: 6 .  ALPRAZolam (XANAX) 0.5 MG tablet, Take 1 tablet (0.5 mg total) by mouth 2 (two) times daily as needed  for anxiety., Disp: 60 tablet, Rfl: 2 .  baclofen (LIORESAL) 20 MG tablet, Take 20 mg by mouth 2 (two) times daily. ~not her rx, takes her spouse's rx~, Disp: , Rfl:  .  busPIRone (BUSPAR) 10 MG tablet, Take 1 tablet (10 mg total) by mouth 3 (three) times daily., Disp: 90 tablet, Rfl: 5 .  doxepin (SINEQUAN) 25 MG capsule, TAKE 1 TO 2 CAPSULES BY MOUTH AT BEDTIME, Disp: 180 capsule, Rfl: 1 .  DULoxetine (CYMBALTA) 60 MG capsule, Take 2 capsules (120 mg total) by mouth daily., Disp: 60 capsule, Rfl: 2 .  fluticasone furoate-vilanterol (BREO ELLIPTA) 100-25 MCG/INH AEPB, Inhale 1 puff into the lungs daily., Disp: 1 each, Rfl: 6 .  Multiple Vitamin (MULTIVITAMIN WITH MINERALS) TABS tablet, Take 1 tablet by mouth daily., Disp: , Rfl:  .  nitrofurantoin, macrocrystal-monohydrate, (MACROBID) 100 MG capsule, Take 1 capsule (100 mg total) by mouth 2 (two) times daily. 1 po BId, Disp: 20 capsule, Rfl: 0 .  phenazopyridine (PYRIDIUM) 100 MG tablet, Take 1 tablet (100 mg total) by mouth 3 (three) times daily as needed for pain., Disp: 10 tablet, Rfl: 0 .  rOPINIRole (REQUIP) 0.5 MG tablet, Take 1-2 tablets (0.5-1 mg total) by mouth at bedtime., Disp: 60 tablet, Rfl: 5 .  umeclidinium bromide (INCRUSE ELLIPTA) 62.5 MCG/INH AEPB, Inhale 1 puff into the lungs daily., Disp: 30 each, Rfl: 11 Continue all other maintenance medications as listed above.  Follow up plan: Return in about 6 months (around 02/19/2018).  Educational handout given for  survey  Remus Loffler PA-C [facility] [address]  [place], [place] [address] [phone]   08/21/2017, 11:36 AM

## 2017-09-16 ENCOUNTER — Encounter: Payer: Self-pay | Admitting: Pulmonary Disease

## 2017-09-16 ENCOUNTER — Ambulatory Visit (INDEPENDENT_AMBULATORY_CARE_PROVIDER_SITE_OTHER): Payer: No Typology Code available for payment source | Admitting: Pulmonary Disease

## 2017-09-16 VITALS — BP 128/78 | HR 85 | Ht 64.0 in | Wt 186.4 lb

## 2017-09-16 DIAGNOSIS — J449 Chronic obstructive pulmonary disease, unspecified: Secondary | ICD-10-CM

## 2017-09-16 DIAGNOSIS — F17210 Nicotine dependence, cigarettes, uncomplicated: Secondary | ICD-10-CM

## 2017-09-16 MED ORDER — FLUTICASONE FUROATE-VILANTEROL 200-25 MCG/INH IN AEPB: 1.0000 | INHALATION_SPRAY | Freq: Every day | RESPIRATORY_TRACT | 3 refills | Status: DC

## 2017-09-16 MED ORDER — IPRATROPIUM-ALBUTEROL 0.5-2.5 (3) MG/3ML IN SOLN: 3.0000 mL | Freq: Four times a day (QID) | RESPIRATORY_TRACT | 3 refills | Status: DC | PRN

## 2017-09-16 MED ORDER — NICOTINE 21 MG/24HR TD PT24: 21.0000 mg | MEDICATED_PATCH | Freq: Every day | TRANSDERMAL | 0 refills | Status: DC

## 2017-09-16 NOTE — Progress Notes (Signed)
Alicia Sanchez    [id number]    1961-01-06  Primary Care Physician:Jones, Wayland Salinas, PA-C  Referring Physician: Remus Loffler, PA-C [address] [place], [place] [address]  Chief complaint:   Follow-up for COPD  HPI: [name] is a 56 year old active smoker with history of anxiety, depression, COPD. She was diagnosed with COPD several years ago but does not have any PFTs on record. She was started on breo and incruse in January 2018 by her primary care physician. She reports daily symptoms of dyspnea with activity, productive cough with white mucus production, chest congestion. She also symptoms of fatigue, exertion, anxiety  She has daily symptoms of snoring. She does sleep study 4 years ago but does not know the result of this test. She is a diagnosis of restless leg syndrome and is on requip. She did not have the sleep study done as it was not covered by insurance.  Pets:Dog which lives outside the house. No pets, exotic pets. Occupation:Customer sevice rep. previously worked d in [employer] Exposures: His current dust exposure and previous exposure to cotton fiber in her line of work Smoking history: 35-pack-year smoking history. Continues to smoke 1 pack per day  Interim history: Continues to have dyspnea on exertion which is unchanged since last visit associated with cough, light brown mucus production.  Denies any fevers, chills, hemoptysis She is using her rescue inhaler up to 6 times per day and continues to smoke 1-1.5 pack/day.  Outpatient Encounter Prescriptions as of 09/16/2017  Medication Sig  . albuterol (PROVENTIL HFA;VENTOLIN HFA) 108 (90 Base) MCG/ACT inhaler Inhale 2 puffs into the lungs every 6 (six) hours as needed for wheezing or shortness of breath.  . ALPRAZolam (XANAX) 0.5 MG tablet Take 1 tablet (0.5 mg total) by mouth 2 (two) times daily as needed for anxiety.  . baclofen (LIORESAL) 20 MG tablet Take 20 mg by mouth 2 (two) times daily. ~not her rx,  takes her spouse's rx~  . busPIRone (BUSPAR) 10 MG tablet Take 1 tablet (10 mg total) by mouth 3 (three) times daily.  Marland Kitchen doxepin (SINEQUAN) 25 MG capsule TAKE 1 TO 2 CAPSULES BY MOUTH AT BEDTIME  . DULoxetine (CYMBALTA) 60 MG capsule Take 2 capsules (120 mg total) by mouth daily.  . fluticasone furoate-vilanterol (BREO ELLIPTA) 100-25 MCG/INH AEPB Inhale 1 puff into the lungs daily.  . Multiple Vitamin (MULTIVITAMIN WITH MINERALS) TABS tablet Take 1 tablet by mouth daily.  . nitrofurantoin, macrocrystal-monohydrate, (MACROBID) 100 MG capsule Take 1 capsule (100 mg total) by mouth 2 (two) times daily. 1 po BId  . phenazopyridine (PYRIDIUM) 100 MG tablet Take 1 tablet (100 mg total) by mouth 3 (three) times daily as needed for pain.  Marland Kitchen rOPINIRole (REQUIP) 0.5 MG tablet Take 1-2 tablets (0.5-1 mg total) by mouth at bedtime.  Marland Kitchen umeclidinium bromide (INCRUSE ELLIPTA) 62.5 MCG/INH AEPB Inhale 1 puff into the lungs daily.   No facility-administered encounter medications on file as of 09/16/2017.     Allergies as of 09/16/2017  . (No Known Allergies)    Past Medical History:  Diagnosis Date  . Anxiety   . COPD (chronic obstructive pulmonary disease) (HCC)     Past Surgical History:  Procedure Laterality Date  . ABDOMINAL HYSTERECTOMY      Family History  Problem Relation Age of Onset  . COPD Mother   . COPD Father   . Diabetes type II Sister   . Diabetes type  II Brother   . COPD Maternal Grandfather     Social History   Social History  . Marital status: Married    Spouse name: N/A  . Number of children: N/A  . Years of education: N/A   Occupational History  . Not on file.   Social History Main Topics  . Smoking status: Current Every Day Smoker    Packs/day: 1.00    Years: 42.00    Types: Cigarettes  . Smokeless tobacco: Never Used  . Alcohol use No  . Drug use: No  . Sexual activity: Yes    Birth control/ protection: Surgical   Other Topics Concern  . Not on file     Social History Narrative  . No narrative on file    Review of systems: Review of Systems  Constitutional: Negative for fever and chills.  HENT: Negative.   Eyes: Negative for blurred vision.  Respiratory: as per HPI  Cardiovascular: Negative for chest pain and palpitations.  Gastrointestinal: Negative for vomiting, diarrhea, blood per rectum. Genitourinary: Negative for dysuria, urgency, frequency and hematuria.  Musculoskeletal: Negative for myalgias, back pain and joint pain.  Skin: Negative for itching and rash.  Neurological: Negative for dizziness, tremors, focal weakness, seizures and loss of consciousness.  Endo/Heme/Allergies: Negative for environmental allergies.  Psychiatric/Behavioral: Negative for depression, suicidal ideas and hallucinations.  All other systems reviewed and are negative.  Physical Exam: Blood pressure 114/78, pulse 89, height 5\' 4"  (1.626 m), weight 184 lb (83.5 kg), SpO2 98 %. Gen:      No acute distress HEENT:  EOMI, sclera anicteric Neck:     No masses; no thyromegaly Lungs:    Clear to auscultation bilaterally; normal respiratory effort CV:         Regular rate and rhythm; no murmurs Abd:      + bowel sounds; soft, non-tender; no palpable masses, no distension Ext:    No edema; adequate peripheral perfusion Skin:      Warm and dry; no rash Neuro: alert and oriented x 3 Psych: normal mood and affect  Data Reviewed: Chest x-ray 07/22/15-interstitial marking consistent with bronchitis Chest x-ray 11/19/15-chronic interstitial marking Chest x-ray 11/20/15-chronic interstitial marking. Stable compared to prior Chest x-ray 04/25/17-hyperinflation, chronic interstitial marking. No acute process. Screening CT chest 05/08/17-centrilobular emphysema, calcified granuloma, tiny subcentimeter pulmonary nodules. I have reviewed all images personally  PFTs 9/5/1 8 FVC 2.54 (74%], FEV1 1.55 (58%], F/F 61, TLC 103%, DLCO 64% Moderate obstruction and diffusion  impairment with air trapping  CBC/1/17-absolute eosinophil count 0  Assessment:  COPD, chronic bronchitis She is on breo and incruse but still continues to be symptomatic with heavy use of albuterol rescue inhaler.  Although she does not have significant eosinophilia in the blood, there is increase in mid flow rates post bronchodilator indicating bronchial reactivity.  She may benefit from increasing Breo dose to 200.  Order nebulizer and DuoNebs as needed   Active smoker Continues to smoke heavily.  Discussed smoking cessation with her and she is willing to try nicotine patches.  We would like to avoid Chantix or Wellbutrin given her psychiatric history.  Time spent counseling-5 minutes  Subcentimeter pulmonary nodule CT scan noted earlier this year with 1.8 mm pulmonary nodule. She is here with her daughter who is very concerned that she may have a malignancy.  I reviewed the CT scan with the patient and her daughter and reassured them that we will continue to follow up with annual imaging.  Restless leg  syndrome, suspected sleep apnea On requip for restless leg syndrome.  Sleep study not done as it is not covered by insurance.  Plan/Recommendations: - Continue incruse, Increase breo to 200 - OrderNebulizer and start duo nebs as needed - Annual screening CT of chest - Smoking cessation. Prescribe nicotine patches.   More then 1/2 the time of the 25 min visit was spent in counseling and/or coordination of care with the patient and family.  Chilton Greathouse MD [facility] Pager [phone] 09/16/2017, 8:18 AM  CC: Remus Loffler, PA-C

## 2017-09-16 NOTE — Patient Instructions (Signed)
We will increase the Breo to 200.  Continue the incruse We will order nebulizer and DuoNeb's every 4 hours as needed Please continue to work on smoking cessation.  Will prescribe nicotine patches Follow-up with low-dose screening CTs of the chest Follow-up in 6 months.

## 2017-09-25 ENCOUNTER — Encounter: Payer: Self-pay | Admitting: Physician Assistant

## 2017-09-25 ENCOUNTER — Ambulatory Visit: Payer: No Typology Code available for payment source | Admitting: Physician Assistant

## 2017-09-25 VITALS — BP 104/75 | HR 107 | Temp 97.8°F | Ht 64.0 in | Wt 184.0 lb

## 2017-09-25 DIAGNOSIS — Z23 Encounter for immunization: Secondary | ICD-10-CM

## 2017-09-25 DIAGNOSIS — N3281 Overactive bladder: Secondary | ICD-10-CM | POA: Diagnosis not present

## 2017-09-25 DIAGNOSIS — R918 Other nonspecific abnormal finding of lung field: Secondary | ICD-10-CM

## 2017-09-25 DIAGNOSIS — J439 Emphysema, unspecified: Secondary | ICD-10-CM

## 2017-09-25 MED ORDER — NEBULIZER/TUBING/MOUTHPIECE KIT: 1.0000 [IU] | PACK | Freq: Four times a day (QID) | 11 refills | Status: DC

## 2017-09-25 MED ORDER — IPRATROPIUM-ALBUTEROL 0.5-2.5 (3) MG/3ML IN SOLN: 3.0000 mL | Freq: Four times a day (QID) | RESPIRATORY_TRACT | 3 refills | Status: DC | PRN

## 2017-09-25 NOTE — Progress Notes (Signed)
BP 104/75   Pulse (!) 107   Temp 97.8 F (36.6 C) (Oral)   Ht '5\' 4"'  (1.626 m)   Wt 184 lb (83.5 kg)   BMI 31.58 kg/m    Subjective:    Patient ID: Alicia Sanchez, female    DOB: 12/30/1960, 56 y.o.   MRN: [id number]  HPI: Alicia Sanchez is a 55 y.o. female presenting on 09/25/2017 for Follow-up (3 month )  This patient comes in for periodic recheck on medications and conditions including COPD, history of lung nodules, overactive bladder.  She reports overall she is doing very well.  She has almost quit smoking.  I commended her on this and encouraged her to continue to work on it.  Continue with her urologist and pulmonologist for investigation of things that are going on.  She did will recheck in 6 months on the nodules and will be having a urology test next week.  All medications are reviewed today. There are no reports of any problems with the medications. All of the medical conditions are reviewed and updated.  Lab work is reviewed and will be ordered as medically necessary. There are no new problems reported with today's visit.   Relevant past medical, surgical, family and social history reviewed and updated as indicated. Allergies and medications reviewed and updated.  Past Medical History:  Diagnosis Date  . Anxiety   . COPD (chronic obstructive pulmonary disease) ([place])     Past Surgical History:  Procedure Laterality Date  . ABDOMINAL HYSTERECTOMY      Review of Systems  Constitutional: Negative.  Negative for activity change, fatigue and fever.  HENT: Negative.   Eyes: Negative.   Respiratory: Negative.  Negative for cough.   Cardiovascular: Negative.  Negative for chest pain.  Gastrointestinal: Negative.  Negative for abdominal pain.  Endocrine: Negative.   Genitourinary: Positive for frequency. Negative for dysuria and hematuria.  Musculoskeletal: Negative.   Skin: Negative for color change.  Neurological: Negative.     Allergies as of 09/25/2017   No Known  Allergies     Medication List        Accurate as of 09/25/17 12:47 PM. Always use your most recent med list.          albuterol 108 (90 Base) MCG/ACT inhaler Commonly known as:  PROVENTIL HFA;VENTOLIN HFA Inhale 2 puffs into the lungs every 6 (six) hours as needed for wheezing or shortness of breath.   ALPRAZolam 0.5 MG tablet Commonly known as:  XANAX Take 1 tablet (0.5 mg total) by mouth 2 (two) times daily as needed for anxiety.   baclofen 20 MG tablet Commonly known as:  LIORESAL Take 20 mg by mouth 2 (two) times daily. ~not her rx, takes her spouse's rx~   busPIRone 10 MG tablet Commonly known as:  BUSPAR Take 1 tablet (10 mg total) by mouth 3 (three) times daily.   doxepin 25 MG capsule Commonly known as:  SINEQUAN TAKE 1 TO 2 CAPSULES BY MOUTH AT BEDTIME   DULoxetine 60 MG capsule Commonly known as:  CYMBALTA Take 2 capsules (120 mg total) by mouth daily.   fluticasone furoate-vilanterol 200-25 MCG/INH Aepb Commonly known as:  BREO ELLIPTA Inhale 1 puff into the lungs daily.   ipratropium-albuterol 0.5-2.5 (3) MG/3ML Soln Commonly known as:  DUONEB Take 3 mLs every 6 (six) hours as needed by nebulization.   multivitamin with minerals Tabs tablet Take 1 tablet by mouth daily.   Nebulizer/Tubing/Mouthpiece Kit 1 Units 4 (four)  times daily by Does not apply route. GIVE face mask and tubing for nebulizer   nicotine 21 mg/24hr patch Commonly known as:  NICODERM CQ - dosed in mg/24 hours Place 1 patch (21 mg total) onto the skin daily.   phenazopyridine 100 MG tablet Commonly known as:  PYRIDIUM Take 1 tablet (100 mg total) by mouth 3 (three) times daily as needed for pain.   rOPINIRole 0.5 MG tablet Commonly known as:  REQUIP Take 1-2 tablets (0.5-1 mg total) by mouth at bedtime.   umeclidinium bromide 62.5 MCG/INH Aepb Commonly known as:  INCRUSE ELLIPTA Inhale 1 puff into the lungs daily.          Objective:    BP 104/75   Pulse (!) 107    Temp 97.8 F (36.6 C) (Oral)   Ht '5\' 4"'  (1.626 m)   Wt 184 lb (83.5 kg)   BMI 31.58 kg/m   No Known Allergies  Physical Exam      Assessment & Plan:   1. Pulmonary emphysema, unspecified emphysema type ([place])  2. Lung nodules Follow with pulmonology  3. OAB (overactive bladder) Continue with urology  4. Need for immunization against influenza - Flu Vaccine QUAD 36+ mos IM    Current Outpatient Medications:  .  albuterol (PROVENTIL HFA;VENTOLIN HFA) 108 (90 Base) MCG/ACT inhaler, Inhale 2 puffs into the lungs every 6 (six) hours as needed for wheezing or shortness of breath., Disp: 1 Inhaler, Rfl: 6 .  ALPRAZolam (XANAX) 0.5 MG tablet, Take 1 tablet (0.5 mg total) by mouth 2 (two) times daily as needed for anxiety., Disp: 60 tablet, Rfl: 2 .  baclofen (LIORESAL) 20 MG tablet, Take 20 mg by mouth 2 (two) times daily. ~not her rx, takes her spouse's rx~, Disp: , Rfl:  .  busPIRone (BUSPAR) 10 MG tablet, Take 1 tablet (10 mg total) by mouth 3 (three) times daily., Disp: 90 tablet, Rfl: 5 .  doxepin (SINEQUAN) 25 MG capsule, TAKE 1 TO 2 CAPSULES BY MOUTH AT BEDTIME, Disp: 180 capsule, Rfl: 1 .  DULoxetine (CYMBALTA) 60 MG capsule, Take 2 capsules (120 mg total) by mouth daily., Disp: 60 capsule, Rfl: 2 .  fluticasone furoate-vilanterol (BREO ELLIPTA) 200-25 MCG/INH AEPB, Inhale 1 puff into the lungs daily., Disp: 1 each, Rfl: 3 .  ipratropium-albuterol (DUONEB) 0.5-2.5 (3) MG/3ML SOLN, Take 3 mLs every 6 (six) hours as needed by nebulization., Disp: 360 mL, Rfl: 3 .  Multiple Vitamin (MULTIVITAMIN WITH MINERALS) TABS tablet, Take 1 tablet by mouth daily., Disp: , Rfl:  .  nicotine (NICODERM CQ - DOSED IN MG/24 HOURS) 21 mg/24hr patch, Place 1 patch (21 mg total) onto the skin daily., Disp: 28 patch, Rfl: 0 .  phenazopyridine (PYRIDIUM) 100 MG tablet, Take 1 tablet (100 mg total) by mouth 3 (three) times daily as needed for pain., Disp: 10 tablet, Rfl: 0 .  rOPINIRole (REQUIP) 0.5 MG  tablet, Take 1-2 tablets (0.5-1 mg total) by mouth at bedtime., Disp: 60 tablet, Rfl: 5 .  umeclidinium bromide (INCRUSE ELLIPTA) 62.5 MCG/INH AEPB, Inhale 1 puff into the lungs daily., Disp: 30 each, Rfl: 11 .  Respiratory Therapy Supplies (NEBULIZER/TUBING/MOUTHPIECE) KIT, 1 Units 4 (four) times daily by Does not apply route. GIVE face mask and tubing for nebulizer, Disp: 1 each, Rfl: 11 Continue all other maintenance medications as listed above.  Follow up plan: Return in about 6 months (around 03/25/2018) for recheck.  Educational handout given for survey  Terald Sleeper PA-C [facility] [address]  [place], [place] [address] [phone]   09/25/2017, 12:47 PM

## 2017-09-25 NOTE — Patient Instructions (Signed)
In a few days you may receive a survey in the mail or online from [employer] regarding your visit with us today. Please take a moment to fill this out. Your feedback is very important to our whole office. It can help us better understand your needs as well as improve your experience and satisfaction. Thank you for taking your time to complete it. We care about you.  Zakeria Kulzer, PA-C  

## 2017-10-01 ENCOUNTER — Other Ambulatory Visit: Payer: Self-pay | Admitting: Physician Assistant

## 2017-10-01 DIAGNOSIS — F331 Major depressive disorder, recurrent, moderate: Secondary | ICD-10-CM

## 2017-10-15 DIAGNOSIS — Z0289 Encounter for other administrative examinations: Secondary | ICD-10-CM

## 2017-10-23 ENCOUNTER — Other Ambulatory Visit: Payer: Self-pay | Admitting: Physician Assistant

## 2017-10-23 DIAGNOSIS — F411 Generalized anxiety disorder: Secondary | ICD-10-CM

## 2017-10-23 NOTE — Telephone Encounter (Signed)
Phoned in.

## 2017-10-23 NOTE — Telephone Encounter (Signed)
Last seen 09/25/17 Lawanna KobusAngel If approved route [facility]  [phone]

## 2017-11-15 ENCOUNTER — Ambulatory Visit: Payer: No Typology Code available for payment source | Admitting: Physician Assistant

## 2017-11-15 ENCOUNTER — Encounter: Payer: Self-pay | Admitting: Physician Assistant

## 2017-11-15 VITALS — BP 131/88 | HR 117 | Temp 97.4°F | Ht 64.0 in | Wt 183.0 lb

## 2017-11-15 DIAGNOSIS — R319 Hematuria, unspecified: Secondary | ICD-10-CM

## 2017-11-15 DIAGNOSIS — J40 Bronchitis, not specified as acute or chronic: Secondary | ICD-10-CM

## 2017-11-15 DIAGNOSIS — R300 Dysuria: Secondary | ICD-10-CM

## 2017-11-15 DIAGNOSIS — N3281 Overactive bladder: Secondary | ICD-10-CM

## 2017-11-15 LAB — URINALYSIS, COMPLETE
Bilirubin, UA: NEGATIVE
GLUCOSE, UA: NEGATIVE
KETONES UA: NEGATIVE
Nitrite, UA: NEGATIVE
PROTEIN UA: NEGATIVE
SPEC GRAV UA: 1.01 (ref 1.005–1.030)
UUROB: 0.2 mg/dL (ref 0.2–1.0)
pH, UA: 7 (ref 5.0–7.5)

## 2017-11-15 LAB — MICROSCOPIC EXAMINATION: Renal Epithel, UA: NONE SEEN /hpf

## 2017-11-15 MED ORDER — OXYBUTYNIN CHLORIDE ER 10 MG PO TB24: 10.0000 mg | ORAL_TABLET | Freq: Every day | ORAL | 5 refills | Status: DC

## 2017-11-15 MED ORDER — LEVOFLOXACIN 500 MG PO TABS
500.0000 mg | ORAL_TABLET | Freq: Every day | ORAL | 0 refills | Status: DC
Start: 2017-11-15 — End: 2017-11-25

## 2017-11-15 MED ORDER — PREDNISONE 10 MG (48) PO TBPK: ORAL_TABLET | ORAL | 0 refills | Status: DC

## 2017-11-15 NOTE — Patient Instructions (Signed)
In a few days you may receive a survey in the mail or online from [employer] regarding your visit with us today. Please take a moment to fill this out. Your feedback is very important to our whole office. It can help us better understand your needs as well as improve your experience and satisfaction. Thank you for taking your time to complete it. We care about you.  Nolin Grell, PA-C  

## 2017-11-16 NOTE — Progress Notes (Signed)
BP 131/88   Pulse (!) 117   Temp (!) 97.4 F (36.3 C) (Oral)   Ht '5\' 4"'  (1.626 m)   Wt 183 lb (83 kg)   BMI 31.41 kg/m    Subjective:    Patient ID: Alicia Sanchez, female    DOB: 1961/09/29, 56 y.o.   MRN: [id number]  HPI: Alicia Sanchez is a 56 y.o. female presenting on 11/15/2017 for Dysuria (pt here today c/o dysuria and hematuria since last night)  This patient has had several days of dysuria, frequency and nocturia. There is also pain over the bladder in the suprapubic region, no back pain. Denies leakage or hematuria.  Denies fever or chills. No pain in flank area.  Patient with several days of progressing upper respiratory and bronchial symptoms. Initially there was more upper respiratory congestion. This progressed to having significant cough that is productive throughout the day and severe at night. There is occasional wheezing after coughing. Sometimes there is slight dyspnea on exertion. It is productive mucus that is yellow in color. Denies any blood.   Relevant past medical, surgical, family and social history reviewed and updated as indicated. Allergies and medications reviewed and updated.  Past Medical History:  Diagnosis Date  . Anxiety   . COPD (chronic obstructive pulmonary disease) ([place])     Past Surgical History:  Procedure Laterality Date  . ABDOMINAL HYSTERECTOMY      Review of Systems  Constitutional: Positive for chills and fatigue. Negative for activity change and appetite change.  HENT: Positive for congestion, postnasal drip and sore throat.   Eyes: Negative.   Respiratory: Positive for cough, shortness of breath and wheezing.   Cardiovascular: Negative.  Negative for chest pain, palpitations and leg swelling.  Gastrointestinal: Negative.   Genitourinary: Positive for difficulty urinating, dysuria and urgency. Negative for flank pain.  Musculoskeletal: Negative.   Skin: Negative.   Neurological: Positive for headaches.    Allergies as of  11/15/2017   No Known Allergies     Medication List        Accurate as of 11/15/17 11:59 PM. Always use your most recent med list.          albuterol 108 (90 Base) MCG/ACT inhaler Commonly known as:  PROVENTIL HFA;VENTOLIN HFA Inhale 2 puffs into the lungs every 6 (six) hours as needed for wheezing or shortness of breath.   ALPRAZolam 0.5 MG tablet Commonly known as:  XANAX TAKE 1 TABLET BY MOUTH TWICE DAILY AS NEEDED FOR ANXIETY   baclofen 20 MG tablet Commonly known as:  LIORESAL Take 20 mg by mouth 2 (two) times daily. ~not her rx, takes her spouse's rx~   busPIRone 10 MG tablet Commonly known as:  BUSPAR Take 1 tablet (10 mg total) by mouth 3 (three) times daily.   doxepin 25 MG capsule Commonly known as:  SINEQUAN TAKE 1 TO 2 CAPSULES BY MOUTH AT BEDTIME   DULoxetine 60 MG capsule Commonly known as:  CYMBALTA TAKE 2 CAPSULES BY MOUTH ONCE DAILY   fluticasone furoate-vilanterol 200-25 MCG/INH Aepb Commonly known as:  BREO ELLIPTA Inhale 1 puff into the lungs daily.   ipratropium-albuterol 0.5-2.5 (3) MG/3ML Soln Commonly known as:  DUONEB Take 3 mLs every 6 (six) hours as needed by nebulization.   levofloxacin 500 MG tablet Commonly known as:  LEVAQUIN Take 1 tablet (500 mg total) by mouth daily.   multivitamin with minerals Tabs tablet Take 1 tablet by mouth daily.   Nebulizer/Tubing/Mouthpiece Kit 1 Units 4 (  four) times daily by Does not apply route. GIVE face mask and tubing for nebulizer   nicotine 21 mg/24hr patch Commonly known as:  NICODERM CQ - dosed in mg/24 hours Place 1 patch (21 mg total) onto the skin daily.   oxybutynin 10 MG 24 hr tablet Commonly known as:  DITROPAN-XL Take 1 tablet (10 mg total) by mouth at bedtime. OVERACTIVE BLADDER   predniSONE 10 MG (48) Tbpk tablet Commonly known as:  STERAPRED UNI-PAK 48 TAB Take as directed 12 days   rOPINIRole 0.5 MG tablet Commonly known as:  REQUIP Take 1-2 tablets (0.5-1 mg total) by  mouth at bedtime.   umeclidinium bromide 62.5 MCG/INH Aepb Commonly known as:  INCRUSE ELLIPTA Inhale 1 puff into the lungs daily.          Objective:    BP 131/88   Pulse (!) 117   Temp (!) 97.4 F (36.3 C) (Oral)   Ht '5\' 4"'  (1.626 m)   Wt 183 lb (83 kg)   BMI 31.41 kg/m   No Known Allergies  Physical Exam  Constitutional: She is oriented to person, place, and time. She appears well-developed and well-nourished.  HENT:  Head: Normocephalic and atraumatic.  Right Ear: There is drainage and tenderness.  Left Ear: There is drainage and tenderness.  Nose: Mucosal edema and rhinorrhea present. Right sinus exhibits no maxillary sinus tenderness and no frontal sinus tenderness. Left sinus exhibits no maxillary sinus tenderness and no frontal sinus tenderness.  Mouth/Throat: Oropharyngeal exudate and posterior oropharyngeal erythema present.  Eyes: Conjunctivae and EOM are normal. Pupils are equal, round, and reactive to light.  Neck: Normal range of motion. Neck supple.  Cardiovascular: Normal rate, regular rhythm, normal heart sounds and intact distal pulses.  Pulmonary/Chest: Effort normal. She has wheezes in the right upper field and the left upper field.  Abdominal: Soft. Bowel sounds are normal. She exhibits no distension and no mass. There is tenderness. There is no rebound and no guarding.  Neurological: She is alert and oriented to person, place, and time. She has normal reflexes.  Skin: Skin is warm and dry. No rash noted.  Psychiatric: She has a normal mood and affect. Her behavior is normal. Judgment and thought content normal.    Results for orders placed or performed in visit on 11/15/17  Microscopic Examination  Result Value Ref Range   WBC, UA 11-30 (A) 0 - 5 /hpf   RBC, UA 3-10 (A) 0 - 2 /hpf   Epithelial Cells (non renal) 0-10 0 - 10 /hpf   Renal Epithel, UA None seen None seen /hpf   Bacteria, UA Many (A) None seen/Few  Urinalysis, Complete  Result Value  Ref Range   Specific Gravity, UA 1.010 1.005 - 1.030   pH, UA 7.0 5.0 - 7.5   Color, UA Yellow Yellow   Appearance Ur Clear Clear   Leukocytes, UA 2+ (A) Negative   Protein, UA Negative Negative/Trace   Glucose, UA Negative Negative   Ketones, UA Negative Negative   RBC, UA 2+ (A) Negative   Bilirubin, UA Negative Negative   Urobilinogen, Ur 0.2 0.2 - 1.0 mg/dL   Nitrite, UA Negative Negative   Microscopic Examination See below:       Assessment & Plan:   1. Hematuria, unspecified type - Urinalysis, Complete - Urine Culture - levofloxacin (LEVAQUIN) 500 MG tablet; Take 1 tablet (500 mg total) by mouth daily.  Dispense: 10 tablet; Refill: 0 - Microscopic Examination  2. Dysuria -  Urine Culture  3. OAB (overactive bladder) - oxybutynin (DITROPAN-XL) 10 MG 24 hr tablet; Take 1 tablet (10 mg total) by mouth at bedtime. OVERACTIVE BLADDER  Dispense: 30 tablet; Refill: 5  4. Bronchitis - levofloxacin (LEVAQUIN) 500 MG tablet; Take 1 tablet (500 mg total) by mouth daily.  Dispense: 10 tablet; Refill: 0 - predniSONE (STERAPRED UNI-PAK 48 TAB) 10 MG (48) TBPK tablet; Take as directed 12 days  Dispense: 48 tablet; Refill: 0    Current Outpatient Medications:  .  albuterol (PROVENTIL HFA;VENTOLIN HFA) 108 (90 Base) MCG/ACT inhaler, Inhale 2 puffs into the lungs every 6 (six) hours as needed for wheezing or shortness of breath., Disp: 1 Inhaler, Rfl: 6 .  ALPRAZolam (XANAX) 0.5 MG tablet, TAKE 1 TABLET BY MOUTH TWICE DAILY AS NEEDED FOR ANXIETY, Disp: 60 tablet, Rfl: 2 .  baclofen (LIORESAL) 20 MG tablet, Take 20 mg by mouth 2 (two) times daily. ~not her rx, takes her spouse's rx~, Disp: , Rfl:  .  busPIRone (BUSPAR) 10 MG tablet, Take 1 tablet (10 mg total) by mouth 3 (three) times daily., Disp: 90 tablet, Rfl: 5 .  doxepin (SINEQUAN) 25 MG capsule, TAKE 1 TO 2 CAPSULES BY MOUTH AT BEDTIME, Disp: 180 capsule, Rfl: 1 .  DULoxetine (CYMBALTA) 60 MG capsule, TAKE 2 CAPSULES BY MOUTH ONCE  DAILY, Disp: 60 capsule, Rfl: 2 .  fluticasone furoate-vilanterol (BREO ELLIPTA) 200-25 MCG/INH AEPB, Inhale 1 puff into the lungs daily., Disp: 1 each, Rfl: 3 .  ipratropium-albuterol (DUONEB) 0.5-2.5 (3) MG/3ML SOLN, Take 3 mLs every 6 (six) hours as needed by nebulization., Disp: 360 mL, Rfl: 3 .  levofloxacin (LEVAQUIN) 500 MG tablet, Take 1 tablet (500 mg total) by mouth daily., Disp: 10 tablet, Rfl: 0 .  Multiple Vitamin (MULTIVITAMIN WITH MINERALS) TABS tablet, Take 1 tablet by mouth daily., Disp: , Rfl:  .  nicotine (NICODERM CQ - DOSED IN MG/24 HOURS) 21 mg/24hr patch, Place 1 patch (21 mg total) onto the skin daily., Disp: 28 patch, Rfl: 0 .  oxybutynin (DITROPAN-XL) 10 MG 24 hr tablet, Take 1 tablet (10 mg total) by mouth at bedtime. OVERACTIVE BLADDER, Disp: 30 tablet, Rfl: 5 .  predniSONE (STERAPRED UNI-PAK 48 TAB) 10 MG (48) TBPK tablet, Take as directed 12 days, Disp: 48 tablet, Rfl: 0 .  Respiratory Therapy Supplies (NEBULIZER/TUBING/MOUTHPIECE) KIT, 1 Units 4 (four) times daily by Does not apply route. GIVE face mask and tubing for nebulizer, Disp: 1 each, Rfl: 11 .  rOPINIRole (REQUIP) 0.5 MG tablet, Take 1-2 tablets (0.5-1 mg total) by mouth at bedtime., Disp: 60 tablet, Rfl: 5 .  umeclidinium bromide (INCRUSE ELLIPTA) 62.5 MCG/INH AEPB, Inhale 1 puff into the lungs daily., Disp: 30 each, Rfl: 11 Continue all other maintenance medications as listed above.  Follow up plan: Return if symptoms worsen or fail to improve.  Educational handout given for [place] PA-C [facility] [address]  [place], [place] [address] [phone]   11/16/2017, 10:12 AM

## 2017-11-19 LAB — URINE CULTURE

## 2017-11-21 ENCOUNTER — Other Ambulatory Visit: Payer: Self-pay | Admitting: Physician Assistant

## 2017-11-25 ENCOUNTER — Encounter: Payer: Self-pay | Admitting: Physician Assistant

## 2017-11-25 ENCOUNTER — Ambulatory Visit: Payer: 59 | Admitting: Physician Assistant

## 2017-11-25 VITALS — BP 111/59 | HR 110 | Temp 97.7°F | Ht 64.0 in | Wt 185.0 lb

## 2017-11-25 DIAGNOSIS — N390 Urinary tract infection, site not specified: Secondary | ICD-10-CM | POA: Diagnosis not present

## 2017-11-25 DIAGNOSIS — J40 Bronchitis, not specified as acute or chronic: Secondary | ICD-10-CM | POA: Diagnosis not present

## 2017-11-25 DIAGNOSIS — R319 Hematuria, unspecified: Secondary | ICD-10-CM

## 2017-11-25 DIAGNOSIS — N3001 Acute cystitis with hematuria: Secondary | ICD-10-CM | POA: Diagnosis not present

## 2017-11-25 DIAGNOSIS — J441 Chronic obstructive pulmonary disease with (acute) exacerbation: Secondary | ICD-10-CM

## 2017-11-25 LAB — URINALYSIS, COMPLETE
Bilirubin, UA: NEGATIVE
Glucose, UA: NEGATIVE
KETONES UA: NEGATIVE
LEUKOCYTES UA: NEGATIVE
Nitrite, UA: NEGATIVE
PH UA: 7 (ref 5.0–7.5)
PROTEIN UA: NEGATIVE
SPEC GRAV UA: 1.01 (ref 1.005–1.030)
Urobilinogen, Ur: 0.2 mg/dL (ref 0.2–1.0)

## 2017-11-25 LAB — MICROSCOPIC EXAMINATION: Renal Epithel, UA: NONE SEEN /hpf

## 2017-11-25 MED ORDER — LEVOFLOXACIN 500 MG PO TABS: 500.0000 mg | ORAL_TABLET | Freq: Every day | ORAL | 0 refills | Status: DC

## 2017-11-25 NOTE — Patient Instructions (Signed)
In a few days you may receive a survey in the mail or online from [employer] regarding your visit with us today. Please take a moment to fill this out. Your feedback is very important to our whole office. It can help us better understand your needs as well as improve your experience and satisfaction. Thank you for taking your time to complete it. We care about you.  Darreld Hoffer, PA-C  

## 2017-11-25 NOTE — Progress Notes (Signed)
 BP (!) 111/59   Pulse (!) 110   Temp 97.7 F (36.5 C) (Oral)   Ht 5' 4" (1.626 m)   Wt 185 lb (83.9 kg)   BMI 31.76 kg/m    Subjective:    Patient ID: Alicia Sanchez, female    DOB: 02/21/1961, 57 y.o.   MRN: [id number]  HPI: Alicia Sanchez is a 57 y.o. female presenting on 11/25/2017 for Follow-up (routine); Fatigue; and Back Pain  Patient with several days of progressing upper respiratory and bronchial symptoms. Initially there was more upper respiratory congestion. This progressed to having significant cough that is productive throughout the day and severe at night. There is occasional wheezing after coughing. Sometimes there is slight dyspnea on exertion. It is productive mucus that is yellow in color. Denies any blood. Has been treated recently.  Also UTI greatly improved, only cystocele and OAB symptoms remain.   Relevant past medical, surgical, family and social history reviewed and updated as indicated. Allergies and medications reviewed and updated.  Past Medical History:  Diagnosis Date  . Anxiety   . COPD (chronic obstructive pulmonary disease) ([facility])     Past Surgical History:  Procedure Laterality Date  . ABDOMINAL HYSTERECTOMY      Review of Systems  Constitutional: Positive for chills and fatigue. Negative for activity change and appetite change.  HENT: Positive for congestion, postnasal drip and sore throat.   Eyes: Negative.   Respiratory: Positive for cough and wheezing.   Cardiovascular: Negative.  Negative for chest pain, palpitations and leg swelling.  Gastrointestinal: Negative.   Genitourinary: Negative.   Musculoskeletal: Negative.   Skin: Negative.   Neurological: Positive for headaches.    Allergies as of 11/25/2017   No Known Allergies     Medication List        Accurate as of 11/25/17 10:24 PM. Always use your most recent med list.          albuterol 108 (90 Base) MCG/ACT inhaler Commonly known as:  PROVENTIL HFA;VENTOLIN HFA Inhale  2 puffs into the lungs every 6 (six) hours as needed for wheezing or shortness of breath.   ALPRAZolam 0.5 MG tablet Commonly known as:  XANAX TAKE 1 TABLET BY MOUTH TWICE DAILY AS NEEDED FOR ANXIETY   baclofen 20 MG tablet Commonly known as:  LIORESAL Take 20 mg by mouth 2 (two) times daily. ~not her rx, takes her spouse's rx~   busPIRone 10 MG tablet Commonly known as:  BUSPAR Take 1 tablet (10 mg total) by mouth 3 (three) times daily.   doxepin 25 MG capsule Commonly known as:  SINEQUAN TAKE 1 TO 2 CAPSULES BY MOUTH AT BEDTIME   DULoxetine 60 MG capsule Commonly known as:  CYMBALTA TAKE 2 CAPSULES BY MOUTH ONCE DAILY   fluticasone furoate-vilanterol 200-25 MCG/INH Aepb Commonly known as:  BREO ELLIPTA Inhale 1 puff into the lungs daily.   ipratropium-albuterol 0.5-2.5 (3) MG/3ML Soln Commonly known as:  DUONEB Take 3 mLs every 6 (six) hours as needed by nebulization.   levofloxacin 500 MG tablet Commonly known as:  LEVAQUIN Take 1 tablet (500 mg total) by mouth daily.   multivitamin with minerals Tabs tablet Take 1 tablet by mouth daily.   Nebulizer/Tubing/Mouthpiece Kit 1 Units 4 (four) times daily by Does not apply route. GIVE face mask and tubing for nebulizer   nicotine 21 mg/24hr patch Commonly known as:  NICODERM CQ - dosed in mg/24 hours Place 1 patch (21 mg total) onto the skin daily.     oxybutynin 10 MG 24 hr tablet Commonly known as:  DITROPAN-XL Take 1 tablet (10 mg total) by mouth at bedtime. OVERACTIVE BLADDER   predniSONE 10 MG (48) Tbpk tablet Commonly known as:  STERAPRED UNI-PAK 48 TAB Take as directed 12 days   rOPINIRole 0.5 MG tablet Commonly known as:  REQUIP Take 1-2 tablets (0.5-1 mg total) by mouth at bedtime.   umeclidinium bromide 62.5 MCG/INH Aepb Commonly known as:  INCRUSE ELLIPTA Inhale 1 puff into the lungs daily.          Objective:    BP (!) 111/59   Pulse (!) 110   Temp 97.7 F (36.5 C) (Oral)   Ht 5' 4" (1.626  m)   Wt 185 lb (83.9 kg)   BMI 31.76 kg/m   No Known Allergies  Physical Exam  Constitutional: She is oriented to person, place, and time. She appears well-developed and well-nourished.  HENT:  Head: Normocephalic and atraumatic.  Right Ear: There is drainage and tenderness.  Left Ear: There is drainage and tenderness.  Nose: Mucosal edema and rhinorrhea present. Right sinus exhibits no maxillary sinus tenderness and no frontal sinus tenderness. Left sinus exhibits no maxillary sinus tenderness and no frontal sinus tenderness.  Mouth/Throat: Oropharyngeal exudate and posterior oropharyngeal erythema present.  Eyes: Conjunctivae and EOM are normal. Pupils are equal, round, and reactive to light.  Neck: Normal range of motion. Neck supple.  Cardiovascular: Normal rate, regular rhythm, normal heart sounds and intact distal pulses.  Pulmonary/Chest: Effort normal. She has wheezes in the right upper field, the right middle field and the left upper field.      Abdominal: Soft. Bowel sounds are normal.  Neurological: She is alert and oriented to person, place, and time. She has normal reflexes.  Skin: Skin is warm and dry. No rash noted.  Psychiatric: She has a normal mood and affect. Her behavior is normal. Judgment and thought content normal.  Nursing note and vitals reviewed.   Results for orders placed or performed in visit on 11/25/17  Microscopic Examination  Result Value Ref Range   WBC, UA 0-5 0 - 5 /hpf   RBC, UA 3-10 (A) 0 - 2 /hpf   Epithelial Cells (non renal) 0-10 0 - 10 /hpf   Renal Epithel, UA None seen None seen /hpf   Bacteria, UA Few None seen/Few  Urinalysis, Complete  Result Value Ref Range   Specific Gravity, UA 1.010 1.005 - 1.030   pH, UA 7.0 5.0 - 7.5   Color, UA Yellow Yellow   Appearance Ur Clear Clear   Leukocytes, UA Negative Negative   Protein, UA Negative Negative/Trace   Glucose, UA Negative Negative   Ketones, UA Negative Negative   RBC, UA 1+  (A) Negative   Bilirubin, UA Negative Negative   Urobilinogen, Ur 0.2 0.2 - 1.0 mg/dL   Nitrite, UA Negative Negative   Microscopic Examination See below:       Assessment & Plan:   1. Hematuria, unspecified type - Urine Culture - Urinalysis, Complete - levofloxacin (LEVAQUIN) 500 MG tablet; Take 1 tablet (500 mg total) by mouth daily.  Dispense: 10 tablet; Refill: 0  2. Bronchitis - levofloxacin (LEVAQUIN) 500 MG tablet; Take 1 tablet (500 mg total) by mouth daily.  Dispense: 10 tablet; Refill: 0  3. Recurrent UTI  4. Acute cystitis with hematuria  5. COPD exacerbation ([facility])    Current Outpatient Medications:  .  albuterol (PROVENTIL HFA;VENTOLIN HFA) 108 (90 Base) MCG/ACT inhaler,   Inhale 2 puffs into the lungs every 6 (six) hours as needed for wheezing or shortness of breath., Disp: 1 Inhaler, Rfl: 6 .  ALPRAZolam (XANAX) 0.5 MG tablet, TAKE 1 TABLET BY MOUTH TWICE DAILY AS NEEDED FOR ANXIETY, Disp: 60 tablet, Rfl: 2 .  baclofen (LIORESAL) 20 MG tablet, Take 20 mg by mouth 2 (two) times daily. ~not her rx, takes her spouse's rx~, Disp: , Rfl:  .  busPIRone (BUSPAR) 10 MG tablet, Take 1 tablet (10 mg total) by mouth 3 (three) times daily., Disp: 90 tablet, Rfl: 5 .  doxepin (SINEQUAN) 25 MG capsule, TAKE 1 TO 2 CAPSULES BY MOUTH AT BEDTIME, Disp: 180 capsule, Rfl: 1 .  DULoxetine (CYMBALTA) 60 MG capsule, TAKE 2 CAPSULES BY MOUTH ONCE DAILY, Disp: 60 capsule, Rfl: 2 .  fluticasone furoate-vilanterol (BREO ELLIPTA) 200-25 MCG/INH AEPB, Inhale 1 puff into the lungs daily., Disp: 1 each, Rfl: 3 .  ipratropium-albuterol (DUONEB) 0.5-2.5 (3) MG/3ML SOLN, Take 3 mLs every 6 (six) hours as needed by nebulization., Disp: 360 mL, Rfl: 3 .  levofloxacin (LEVAQUIN) 500 MG tablet, Take 1 tablet (500 mg total) by mouth daily., Disp: 10 tablet, Rfl: 0 .  Multiple Vitamin (MULTIVITAMIN WITH MINERALS) TABS tablet, Take 1 tablet by mouth daily., Disp: , Rfl:  .  nicotine (NICODERM CQ - DOSED IN  MG/24 HOURS) 21 mg/24hr patch, Place 1 patch (21 mg total) onto the skin daily., Disp: 28 patch, Rfl: 0 .  oxybutynin (DITROPAN-XL) 10 MG 24 hr tablet, Take 1 tablet (10 mg total) by mouth at bedtime. OVERACTIVE BLADDER, Disp: 30 tablet, Rfl: 5 .  predniSONE (STERAPRED UNI-PAK 48 TAB) 10 MG (48) TBPK tablet, Take as directed 12 days, Disp: 48 tablet, Rfl: 0 .  Respiratory Therapy Supplies (NEBULIZER/TUBING/MOUTHPIECE) KIT, 1 Units 4 (four) times daily by Does not apply route. GIVE face mask and tubing for nebulizer, Disp: 1 each, Rfl: 11 .  rOPINIRole (REQUIP) 0.5 MG tablet, Take 1-2 tablets (0.5-1 mg total) by mouth at bedtime., Disp: 60 tablet, Rfl: 5 .  umeclidinium bromide (INCRUSE ELLIPTA) 62.5 MCG/INH AEPB, Inhale 1 puff into the lungs daily., Disp: 30 each, Rfl: 11 Continue all other maintenance medications as listed above.  Follow up plan: No Follow-up on file.  Educational handout given for [place] PA-C [facility] [address]  [place], [place] [address] [phone]   11/25/2017, 10:24 PM

## 2017-11-26 LAB — URINE CULTURE: Organism ID, Bacteria: NO GROWTH

## 2017-12-26 ENCOUNTER — Other Ambulatory Visit: Payer: Self-pay | Admitting: Physician Assistant

## 2017-12-26 DIAGNOSIS — G2581 Restless legs syndrome: Secondary | ICD-10-CM

## 2017-12-26 DIAGNOSIS — F331 Major depressive disorder, recurrent, moderate: Secondary | ICD-10-CM

## 2017-12-30 ENCOUNTER — Ambulatory Visit: Payer: No Typology Code available for payment source | Admitting: Physician Assistant

## 2017-12-31 ENCOUNTER — Encounter: Payer: Self-pay | Admitting: Physician Assistant

## 2017-12-31 ENCOUNTER — Ambulatory Visit: Payer: 59 | Admitting: Physician Assistant

## 2017-12-31 VITALS — BP 112/78 | HR 120 | Temp 97.2°F | Resp 20 | Ht 64.0 in | Wt 187.0 lb

## 2017-12-31 DIAGNOSIS — F411 Generalized anxiety disorder: Secondary | ICD-10-CM

## 2017-12-31 DIAGNOSIS — J439 Emphysema, unspecified: Secondary | ICD-10-CM | POA: Diagnosis not present

## 2017-12-31 DIAGNOSIS — Z0000 Encounter for general adult medical examination without abnormal findings: Secondary | ICD-10-CM

## 2017-12-31 MED ORDER — ALPRAZOLAM 0.5 MG PO TABS: 0.5000 mg | ORAL_TABLET | Freq: Two times a day (BID) | ORAL | 5 refills | Status: DC | PRN

## 2017-12-31 MED ORDER — FLUTICASONE FUROATE-VILANTEROL 200-25 MCG/INH IN AEPB: 1.0000 | INHALATION_SPRAY | Freq: Every day | RESPIRATORY_TRACT | 11 refills | Status: DC

## 2017-12-31 NOTE — Progress Notes (Signed)
BP 112/78   Pulse (!) 120   Temp (!) 97.2 F (36.2 C) (Oral)   Resp 20   Ht _0  (1.626 m)   Wt 187 lb (84.8 kg)   SpO2 96%   BMI 32.10 kg/m    Subjective:    Patient ID: Alicia Sanchez, female    DOB: 1961/07/17, 57 y.o.   MRN: [id number]  HPI: Alicia Sanchez is a 56 y.o. female presenting on 12/31/2017 for Follow-up (3 month )  This patient comes in for periodic recheck on medications and conditions including COPD, GAD. She is doing really well. Some shortness of breath but generally good.  Anxiety is stable.  She has no other complaints.   All medications are reviewed today. There are no reports of any problems with the medications. All of the medical conditions are reviewed and updated.  Lab work is reviewed and will be ordered as medically necessary. There are no new problems reported with today's visit.   Relevant past medical, surgical, family and social history reviewed and updated as indicated. Allergies and medications reviewed and updated.  Past Medical History:  Diagnosis Date  . Anxiety   . COPD (chronic obstructive pulmonary disease) ([place])     Past Surgical History:  Procedure Laterality Date  . ABDOMINAL HYSTERECTOMY      Review of Systems  Constitutional: Negative.  Negative for activity change, fatigue and fever.  HENT: Negative.   Eyes: Negative.   Respiratory: Negative.  Negative for cough.   Cardiovascular: Negative.  Negative for chest pain.  Gastrointestinal: Negative.  Negative for abdominal pain.  Endocrine: Negative.   Genitourinary: Negative.  Negative for dysuria.  Musculoskeletal: Negative.   Skin: Negative.   Neurological: Negative.     Allergies as of 12/31/2017   No Known Allergies     Medication List        Accurate as of 12/31/17  9:05 AM. Always use your most recent med list.          albuterol 108 (90 Base) MCG/ACT inhaler Commonly known as:  PROVENTIL HFA;VENTOLIN HFA Inhale 2 puffs into the lungs every 6 (six) hours  as needed for wheezing or shortness of breath.   ALPRAZolam 0.5 MG tablet Commonly known as:  XANAX Take 1 tablet (0.5 mg total) by mouth 2 (two) times daily as needed. for anxiety   baclofen 20 MG tablet Commonly known as:  LIORESAL Take 20 mg by mouth 2 (two) times daily. ~not her rx, takes her spouse's rx~   busPIRone 10 MG tablet Commonly known as:  BUSPAR Take 1 tablet (10 mg total) by mouth 3 (three) times daily.   doxepin 25 MG capsule Commonly known as:  SINEQUAN TAKE 1 TO 2 CAPSULES BY MOUTH AT BEDTIME   DULoxetine 60 MG capsule Commonly known as:  CYMBALTA TAKE 2 CAPSULES BY MOUTH ONCE DAILY   fluticasone furoate-vilanterol 200-25 MCG/INH Aepb Commonly known as:  BREO ELLIPTA Inhale 1 puff into the lungs daily.   ipratropium-albuterol 0.5-2.5 (3) MG/3ML Soln Commonly known as:  DUONEB Take 3 mLs every 6 (six) hours as needed by nebulization.   multivitamin with minerals Tabs tablet Take 1 tablet by mouth daily.   Nebulizer/Tubing/Mouthpiece Kit 1 Units 4 (four) times daily by Does not apply route. GIVE face mask and tubing for nebulizer   nicotine 21 mg/24hr patch Commonly known as:  NICODERM CQ - dosed in mg/24 hours Place 1 patch (21 mg total) onto the skin daily.   oxybutynin  10 MG 24 hr tablet Commonly known as:  DITROPAN-XL Take 1 tablet (10 mg total) by mouth at bedtime. OVERACTIVE BLADDER   rOPINIRole 0.5 MG tablet Commonly known as:  REQUIP Take 1-2 tablets (0.5-1 mg total) by mouth at bedtime.   rOPINIRole 1 MG tablet Commonly known as:  REQUIP TAKE 1 TO 4 TABLETS BY MOUTH AT BEDTIME   umeclidinium bromide 62.5 MCG/INH Aepb Commonly known as:  INCRUSE ELLIPTA Inhale 1 puff into the lungs daily.          Objective:    BP 112/78   Pulse (!) 120   Temp (!) 97.2 F (36.2 C) (Oral)   Resp 20   Ht _0  (1.626 m)   Wt 187 lb (84.8 kg)   SpO2 96%   BMI 32.10 kg/m   No Known Allergies  Physical Exam  Constitutional: She is oriented  to person, place, and time. She appears well-developed and well-nourished.  HENT:  Head: Normocephalic and atraumatic.  Eyes: Conjunctivae and EOM are normal. Pupils are equal, round, and reactive to light.  Cardiovascular: Normal rate, regular rhythm, normal heart sounds and intact distal pulses.  Pulmonary/Chest: Effort normal and breath sounds normal.  Abdominal: Soft. Bowel sounds are normal.  Neurological: She is alert and oriented to person, place, and time. She has normal reflexes.  Skin: Skin is warm and dry. No rash noted.  Psychiatric: She has a normal mood and affect. Her behavior is normal. Judgment and thought content normal.  Nursing note and vitals reviewed.       Assessment & Plan:   1. Pulmonary emphysema, unspecified emphysema type (HCC) - fluticasone furoate-vilanterol (BREO ELLIPTA) 200-25 MCG/INH AEPB; Inhale 1 puff into the lungs daily.  Dispense: 1 each; Refill: 11  2. Generalized anxiety disorder - ALPRAZolam (XANAX) 0.5 MG tablet; Take 1 tablet (0.5 mg total) by mouth 2 (two) times daily as needed. for anxiety  Dispense: 60 tablet; Refill: 5    Current Outpatient Medications:  .  albuterol (PROVENTIL HFA;VENTOLIN HFA) 108 (90 Base) MCG/ACT inhaler, Inhale 2 puffs into the lungs every 6 (six) hours as needed for wheezing or shortness of breath., Disp: 1 Inhaler, Rfl: 6 .  ALPRAZolam (XANAX) 0.5 MG tablet, Take 1 tablet (0.5 mg total) by mouth 2 (two) times daily as needed. for anxiety, Disp: 60 tablet, Rfl: 5 .  baclofen (LIORESAL) 20 MG tablet, Take 20 mg by mouth 2 (two) times daily. ~not her rx, takes her spouse's rx~, Disp: , Rfl:  .  busPIRone (BUSPAR) 10 MG tablet, Take 1 tablet (10 mg total) by mouth 3 (three) times daily., Disp: 90 tablet, Rfl: 5 .  doxepin (SINEQUAN) 25 MG capsule, TAKE 1 TO 2 CAPSULES BY MOUTH AT BEDTIME, Disp: 180 capsule, Rfl: 1 .  DULoxetine (CYMBALTA) 60 MG capsule, TAKE 2 CAPSULES BY MOUTH ONCE DAILY, Disp: 180 capsule, Rfl: 0 .   fluticasone furoate-vilanterol (BREO ELLIPTA) 200-25 MCG/INH AEPB, Inhale 1 puff into the lungs daily., Disp: 1 each, Rfl: 11 .  ipratropium-albuterol (DUONEB) 0.5-2.5 (3) MG/3ML SOLN, Take 3 mLs every 6 (six) hours as needed by nebulization., Disp: 360 mL, Rfl: 3 .  Multiple Vitamin (MULTIVITAMIN WITH MINERALS) TABS tablet, Take 1 tablet by mouth daily., Disp: , Rfl:  .  nicotine (NICODERM CQ - DOSED IN MG/24 HOURS) 21 mg/24hr patch, Place 1 patch (21 mg total) onto the skin daily., Disp: 28 patch, Rfl: 0 .  oxybutynin (DITROPAN-XL) 10 MG 24 hr tablet, Take 1 tablet (  10 mg total) by mouth at bedtime. OVERACTIVE BLADDER, Disp: 30 tablet, Rfl: 5 .  Respiratory Therapy Supplies (NEBULIZER/TUBING/MOUTHPIECE) KIT, 1 Units 4 (four) times daily by Does not apply route. GIVE face mask and tubing for nebulizer, Disp: 1 each, Rfl: 11 .  rOPINIRole (REQUIP) 0.5 MG tablet, Take 1-2 tablets (0.5-1 mg total) by mouth at bedtime., Disp: 60 tablet, Rfl: 5 .  rOPINIRole (REQUIP) 1 MG tablet, TAKE 1 TO 4 TABLETS BY MOUTH AT BEDTIME, Disp: 360 tablet, Rfl: 0 .  umeclidinium bromide (INCRUSE ELLIPTA) 62.5 MCG/INH AEPB, Inhale 1 puff into the lungs daily., Disp: 30 each, Rfl: 11 Continue all other maintenance medications as listed above.  Follow up plan: Return in about 6 months (around 06/30/2018) for recheck.  Educational handout given for [place] PA-C [place] [address]  [place], [place] [address] [phone]   12/31/2017, 9:05 AM

## 2017-12-31 NOTE — Patient Instructions (Signed)
In a few days you may receive a survey in the mail or online from [employer] regarding your visit with us today. Please take a moment to fill this out. Your feedback is very important to our whole office. It can help us better understand your needs as well as improve your experience and satisfaction. Thank you for taking your time to complete it. We care about you.  Rogue Pautler, PA-C  

## 2018-01-01 LAB — CMP14+EGFR
ALK PHOS: 68 IU/L (ref 39–117)
ALT: 13 IU/L (ref 0–32)
AST: 19 IU/L (ref 0–40)
Albumin/Globulin Ratio: 2 (ref 1.2–2.2)
Albumin: 4.3 g/dL (ref 3.5–5.5)
BILIRUBIN TOTAL: 0.4 mg/dL (ref 0.0–1.2)
BUN/Creatinine Ratio: 7 — ABNORMAL LOW (ref 9–23)
BUN: 5 mg/dL — AB (ref 6–24)
CHLORIDE: 103 mmol/L (ref 96–106)
CO2: 23 mmol/L (ref 20–29)
CREATININE: 0.75 mg/dL (ref 0.57–1.00)
Calcium: 9.4 mg/dL (ref 8.7–10.2)
GFR calc Af Amer: 102 mL/min/{1.73_m2} (ref >59)
GFR calc non Af Amer: 89 mL/min/{1.73_m2} (ref >59)
GLUCOSE: 67 mg/dL (ref 65–99)
Globulin, Total: 2.1 g/dL (ref 1.5–4.5)
Potassium: 4.3 mmol/L (ref 3.5–5.2)
Sodium: 142 mmol/L (ref 134–144)
TOTAL PROTEIN: 6.4 g/dL (ref 6.0–8.5)

## 2018-01-01 LAB — CBC WITH DIFFERENTIAL/PLATELET
BASOS ABS: 0 10*3/uL (ref 0.0–0.2)
Basos: 0 %
EOS (ABSOLUTE): 0.2 10*3/uL (ref 0.0–0.4)
Eos: 2 %
HEMOGLOBIN: 15.5 g/dL (ref 11.1–15.9)
Hematocrit: 43.7 % (ref 34.0–46.6)
IMMATURE GRANS (ABS): 0 10*3/uL (ref 0.0–0.1)
Immature Granulocytes: 0 %
LYMPHS: 36 %
Lymphocytes Absolute: 3.3 10*3/uL — ABNORMAL HIGH (ref 0.7–3.1)
MCH: 30.8 pg (ref 26.6–33.0)
MCHC: 35.5 g/dL (ref 31.5–35.7)
MCV: 87 fL (ref 79–97)
MONOCYTES: 11 %
Monocytes Absolute: 1 10*3/uL — ABNORMAL HIGH (ref 0.1–0.9)
NEUTROS ABS: 4.7 10*3/uL (ref 1.4–7.0)
NEUTROS PCT: 51 %
PLATELETS: 312 10*3/uL (ref 150–379)
RBC: 5.04 x10E6/uL (ref 3.77–5.28)
RDW: 13.9 % (ref 12.3–15.4)
WBC: 9.3 10*3/uL (ref 3.4–10.8)

## 2018-01-01 LAB — TSH: TSH: 1.97 u[IU]/mL (ref 0.450–4.500)

## 2018-01-01 LAB — LIPID PANEL
CHOLESTEROL TOTAL: 201 mg/dL — AB (ref 100–199)
Chol/HDL Ratio: 4.9 ratio — ABNORMAL HIGH (ref 0.0–4.4)
HDL: 41 mg/dL (ref >39)
LDL CALC: 95 mg/dL (ref 0–99)
Triglycerides: 324 mg/dL — ABNORMAL HIGH (ref 0–149)
VLDL CHOLESTEROL CAL: 65 mg/dL — AB (ref 5–40)

## 2018-01-16 ENCOUNTER — Other Ambulatory Visit: Payer: Self-pay | Admitting: Physician Assistant

## 2018-01-16 DIAGNOSIS — J441 Chronic obstructive pulmonary disease with (acute) exacerbation: Secondary | ICD-10-CM

## 2018-01-23 ENCOUNTER — Other Ambulatory Visit: Payer: Self-pay | Admitting: Physician Assistant

## 2018-01-23 DIAGNOSIS — F331 Major depressive disorder, recurrent, moderate: Secondary | ICD-10-CM

## 2018-03-01 ENCOUNTER — Other Ambulatory Visit: Payer: Self-pay | Admitting: Physician Assistant

## 2018-03-01 DIAGNOSIS — F411 Generalized anxiety disorder: Secondary | ICD-10-CM

## 2018-03-19 ENCOUNTER — Ambulatory Visit (INDEPENDENT_AMBULATORY_CARE_PROVIDER_SITE_OTHER): Payer: 59 | Admitting: Family Medicine

## 2018-03-19 ENCOUNTER — Encounter: Payer: Self-pay | Admitting: Family Medicine

## 2018-03-19 VITALS — BP 115/75 | HR 106 | Temp 98.3°F | Ht 64.0 in | Wt 185.0 lb

## 2018-03-19 DIAGNOSIS — Z720 Tobacco use: Secondary | ICD-10-CM | POA: Diagnosis not present

## 2018-03-19 DIAGNOSIS — J441 Chronic obstructive pulmonary disease with (acute) exacerbation: Secondary | ICD-10-CM | POA: Diagnosis not present

## 2018-03-19 MED ORDER — DOXYCYCLINE HYCLATE 100 MG PO CAPS: 100.0000 mg | ORAL_CAPSULE | Freq: Two times a day (BID) | ORAL | 0 refills | Status: AC

## 2018-03-19 MED ORDER — METHYLPREDNISOLONE ACETATE 80 MG/ML IJ SUSP
80.0000 mg | Freq: Once | INTRAMUSCULAR | Status: AC
  Administered 2018-03-19: 80 mg via INTRAMUSCULAR

## 2018-03-19 MED ORDER — IPRATROPIUM-ALBUTEROL 0.5-2.5 (3) MG/3ML IN SOLN
3.0000 mL | Freq: Once | RESPIRATORY_TRACT | Status: AC
  Administered 2018-03-19: 3 mL via RESPIRATORY_TRACT

## 2018-03-19 MED ORDER — PREDNISONE 20 MG PO TABS: 40.0000 mg | ORAL_TABLET | Freq: Every day | ORAL | 0 refills | Status: AC

## 2018-03-19 NOTE — Progress Notes (Signed)
Subjective: CC:URI PCP: Terald Sleeper, PA-C HUT:[id number] Alicia Sanchez is a 57 y.o. female presenting to clinic today for:  1. Cough Patient notes sinus congestion, productive cough, shortness of breath and wheeze.  This is been ongoing for about 1 week.  Denies any hemoptysis, purulence from nares, fevers, chills.  She has been using Mucinex DM and all of her inhalers as directed.  She reports little improvement in symptoms.  She has known COPD.  Last exacerbation several months ago.  No recent steroid or antibiotic use.  She is an active every day smoker.   ROS: Per HPI  No Known Allergies Past Medical History:  Diagnosis Date  . Anxiety   . COPD (chronic obstructive pulmonary disease) (H[facility])     Current Outpatient Medications:  .  albuterol (PROVENTIL HFA;VENTOLIN HFA) 108 (90 Base) MCG/ACT inhaler, Inhale 2 puffs into the lungs every 6 (six) hours as needed for wheezing or shortness of breath., Disp: 1 Inhaler, Rfl: 6 .  ALPRAZolam (XANAX) 0.5 MG tablet, Take 1 tablet (0.5 mg total) by mouth 2 (two) times daily as needed. for anxiety, Disp: 60 tablet, Rfl: 5 .  baclofen (LIORESAL) 20 MG tablet, Take 20 mg by mouth 2 (two) times daily. ~not her rx, takes her spouse's rx~, Disp: , Rfl:  .  busPIRone (BUSPAR) 10 MG tablet, TAKE 1 TABLET BY MOUTH THREE TIMES DAILY, Disp: 90 tablet, Rfl: 5 .  doxepin (SINEQUAN) 25 MG capsule, TAKE 1 TO 2 CAPSULES BY MOUTH ONCE DAILY AT BEDTIME, Disp: 180 capsule, Rfl: 0 .  DULoxetine (CYMBALTA) 60 MG capsule, TAKE 2 CAPSULES BY MOUTH ONCE DAILY, Disp: 180 capsule, Rfl: 0 .  fluticasone furoate-vilanterol (BREO ELLIPTA) 200-25 MCG/INH AEPB, Inhale 1 puff into the lungs daily., Disp: 1 each, Rfl: 11 .  INCRUSE ELLIPTA 62.5 MCG/INH AEPB, INHALE 1 PUFF BY MOUTH ONCE DAILY, Disp: 30 each, Rfl: 11 .  ipratropium-albuterol (DUONEB) 0.5-2.5 (3) MG/3ML SOLN, Take 3 mLs every 6 (six) hours as needed by nebulization., Disp: 360 mL, Rfl: 3 .  Multiple Vitamin  (MULTIVITAMIN WITH MINERALS) TABS tablet, Take 1 tablet by mouth daily., Disp: , Rfl:  .  nicotine (NICODERM CQ - DOSED IN MG/24 HOURS) 21 mg/24hr patch, Place 1 patch (21 mg total) onto the skin daily., Disp: 28 patch, Rfl: 0 .  oxybutynin (DITROPAN-XL) 10 MG 24 hr tablet, Take 1 tablet (10 mg total) by mouth at bedtime. OVERACTIVE BLADDER, Disp: 30 tablet, Rfl: 5 .  Respiratory Therapy Supplies (NEBULIZER/TUBING/MOUTHPIECE) KIT, 1 Units 4 (four) times daily by Does not apply route. GIVE face mask and tubing for nebulizer, Disp: 1 each, Rfl: 11 .  rOPINIRole (REQUIP) 0.5 MG tablet, Take 1-2 tablets (0.5-1 mg total) by mouth at bedtime., Disp: 60 tablet, Rfl: 5 .  rOPINIRole (REQUIP) 1 MG tablet, TAKE 1 TO 4 TABLETS BY MOUTH AT BEDTIME, Disp: 360 tablet, Rfl: 0 Social History   Socioeconomic History  . Marital status: Married    Spouse name: Not on file  . Number of children: Not on file  . Years of education: Not on file  . Highest education level: Not on file  Occupational History  . Not on file  Social Needs  . Financial resource strain: Not on file  . Food insecurity:    Worry: Not on file    Inability: Not on file  . Transportation needs:    Medical: Not on file    Non-medical: Not on file  Tobacco Use  . Smoking  status: Current Every Day Smoker    Packs/day: 1.50    Years: 42.00    Pack years: 63.00    Types: Cigarettes  . Smokeless tobacco: Never Used  Substance and Sexual Activity  . Alcohol use: No  . Drug use: No  . Sexual activity: Yes    Birth control/protection: Surgical  Lifestyle  . Physical activity:    Days per week: Not on file    Minutes per session: Not on file  . Stress: Not on file  Relationships  . Social connections:    Talks on phone: Not on file    Gets together: Not on file    Attends religious service: Not on file    Active member of club or organization: Not on file    Attends meetings of clubs or organizations: Not on file    Relationship  status: Not on file  . Intimate partner violence:    Fear of current or ex partner: Not on file    Emotionally abused: Not on file    Physically abused: Not on file    Forced sexual activity: Not on file  Other Topics Concern  . Not on file  Social History Narrative  . Not on file   Family History  Problem Relation Age of Onset  . COPD Mother   . COPD Father   . Diabetes type II Sister   . Diabetes type II Brother   . COPD Maternal Grandfather     Objective: Office vital signs reviewed. BP 115/75   Pulse (!) 106   Temp 98.3 F (36.8 C)   Ht '5\' 4"'  (1.626 m)   Wt 185 lb (83.9 kg)   SpO2 91%   BMI 31.76 kg/m   Physical Examination:  General: Awake, alert, obese, No acute distress HEENT: MMM, sclera white Cardio: regular rate and rhythm, S1S2 heard, no murmurs appreciated Pulm: Global inspiratory and expiratory wheezes noted.  Air movement fair.  No rhonchi or rales.  Normal work of breathing on room air  Assessment/ Plan: 57 y.o. female   1. COPD with acute exacerbation ([place]) Patient afebrile and nontoxic on exam.  Her pulse ox is 91% which is a decrease from her baseline which appears to be around 95 to 96%.  Her physical exam was remarkable for global inspiratory and expiratory wheezes with fair air movement.  She was given a dose of Depo-Medrol 80 here in office as well as a DuoNeb inhalation treatment.  Her lung exam did improve after inhalation treatment with increased air movement and resolution of inspiratory wheeze.  I have prescribed her a 5-day burst of prednisone to start tomorrow.  I have also prescribed her doxycycline 100 mg p.o. twice daily for the next 7 days.  Use albuterol inhaler 2 puffs every 4-6 hours scheduled for the next 2 days then use as needed as directed.  Continue home controller inhalers.  Home care instructions were reviewed with the patient.  Reasons for emergent evaluation the emergency department also reviewed.  Both she and her husband voiced  good understanding will follow-up as needed.  Additionally, I strongly encouraged her to stop smoking.  She is contemplative. - ipratropium-albuterol (DUONEB) 0.5-2.5 (3) MG/3ML nebulizer solution 3 mL - methylPREDNISolone acetate (DEPO-MEDROL) injection 80 mg  2. Tobacco abuse Contemplative.  Counseling provided.    Meds ordered this encounter  Medications  . ipratropium-albuterol (DUONEB) 0.5-2.5 (3) MG/3ML nebulizer solution 3 mL  . methylPREDNISolone acetate (DEPO-MEDROL) injection 80 mg  . predniSONE (  DELTASONE) 20 MG tablet    Sig: Take 2 tablets (40 mg total) by mouth daily with breakfast for 5 days.    Dispense:  10 tablet    Refill:  0  . doxycycline (VIBRAMYCIN) 100 MG capsule    Sig: Take 1 capsule (100 mg total) by mouth 2 (two) times daily for 7 days.    Dispense:  14 capsule    Refill:  [place], DO [place] [phone]

## 2018-03-19 NOTE — Patient Instructions (Signed)
You are given a dose of Depo-Medrol here in office today.  Start the prednisone tomorrow.  I have also ordered an antibiotic free to take twice a day for the next 10 days.  You can start the doxycycline today.  Use your albuterol inhaler 2 puffs every 6 hours for the next 2 days scheduled then use as needed as directed.  If your symptoms worsen, you develop significant shortness of breath despite above treatments, you develop blood in your cough, please seek immediate medical attention the emergency department.  Stopping smoking is ESSENTIAL to you being healthy.     Steps to Quit Smoking Smoking tobacco can be bad for your health. It can also affect almost every organ in your body. Smoking puts you and people around you at risk for many serious long-lasting (chronic) diseases. Quitting smoking is hard, but it is one of the best things that you can do for your health. It is never too late to quit. What are the benefits of quitting smoking? When you quit smoking, you lower your risk for getting serious diseases and conditions. They can include:  Lung cancer or lung disease.  Heart disease.  Stroke.  Heart attack.  Not being able to have children (infertility).  Weak bones (osteoporosis) and broken bones (fractures).  If you have coughing, wheezing, and shortness of breath, those symptoms may get better when you quit. You may also get sick less often. If you are pregnant, quitting smoking can help to lower your chances of having a baby of low birth weight. What can I do to help me quit smoking? Talk with your doctor about what can help you quit smoking. Some things you can do (strategies) include:  Quitting smoking totally, instead of slowly cutting back how much you smoke over a period of time.  Going to in-person counseling. You are more likely to quit if you go to many counseling sessions.  Using resources and support systems, such as: ? Agricultural engineer with a Veterinary surgeon. ? Phone  quitlines. ? Automotive engineer. ? Support groups or group counseling. ? Text messaging programs. ? Mobile phone apps or applications.  Taking medicines. Some of these medicines may have nicotine in them. If you are pregnant or breastfeeding, do not take any medicines to quit smoking unless your doctor says it is okay. Talk with your doctor about counseling or other things that can help you.  Talk with your doctor about using more than one strategy at the same time, such as taking medicines while you are also going to in-person counseling. This can help make quitting easier. What things can I do to make it easier to quit? Quitting smoking might feel very hard at first, but there is a lot that you can do to make it easier. Take these steps:  Talk to your family and friends. Ask them to support and encourage you.  Call phone quitlines, reach out to support groups, or work with a Veterinary surgeon.  Ask people who smoke to not smoke around you.  Avoid places that make you want (trigger) to smoke, such as: ? Bars. ? Parties. ? Smoke-break areas at work.  Spend time with people who do not smoke.  Lower the stress in your life. Stress can make you want to smoke. Try these things to help your stress: ? Getting regular exercise. ? Deep-breathing exercises. ? Yoga. ? Meditating. ? Doing a body scan. To do this, close your eyes, focus on one area of your body at  a time from head to toe, and notice which parts of your body are tense. Try to relax the muscles in those areas.  Download or buy apps on your mobile phone or tablet that can help you stick to your quit plan. There are many free apps, such as QuitGuide from the [employer] developer for Disease Control and Prevention). You can find more support from smokefree.gov and other websites.  This information is not intended to replace advice given to you by your health care provider. Make sure you discuss any questions you have with your health care  provider. Document Released: 09/01/2009 Document Revised: 07/03/2016 Document Reviewed: 03/22/2015 Elsevier Interactive Patient Education  2018 [employer].

## 2018-03-28 ENCOUNTER — Other Ambulatory Visit: Payer: Self-pay | Admitting: Physician Assistant

## 2018-03-28 DIAGNOSIS — F331 Major depressive disorder, recurrent, moderate: Secondary | ICD-10-CM

## 2018-04-27 ENCOUNTER — Other Ambulatory Visit: Payer: Self-pay | Admitting: Physician Assistant

## 2018-04-27 DIAGNOSIS — F331 Major depressive disorder, recurrent, moderate: Secondary | ICD-10-CM

## 2018-04-30 ENCOUNTER — Other Ambulatory Visit: Payer: Self-pay | Admitting: Physician Assistant

## 2018-04-30 DIAGNOSIS — G2581 Restless legs syndrome: Secondary | ICD-10-CM

## 2018-05-21 ENCOUNTER — Emergency Department (HOSPITAL_COMMUNITY): Payer: 59

## 2018-05-21 ENCOUNTER — Other Ambulatory Visit: Payer: Self-pay

## 2018-05-21 ENCOUNTER — Other Ambulatory Visit: Payer: Self-pay | Admitting: Physician Assistant

## 2018-05-21 ENCOUNTER — Encounter (HOSPITAL_COMMUNITY): Payer: Self-pay | Admitting: Emergency Medicine

## 2018-05-21 ENCOUNTER — Inpatient Hospital Stay (HOSPITAL_COMMUNITY)
Admission: EM | Admit: 2018-05-21 | Discharge: 2018-05-25 | DRG: 190 | Disposition: A | Discharge status: Home / Self Care | Payer: 59 | Attending: Internal Medicine | Admitting: Internal Medicine

## 2018-05-21 DIAGNOSIS — Z825 Family history of asthma and other chronic lower respiratory diseases: Secondary | ICD-10-CM

## 2018-05-21 DIAGNOSIS — G2581 Restless legs syndrome: Secondary | ICD-10-CM | POA: Diagnosis present

## 2018-05-21 DIAGNOSIS — E872 Acidosis: Secondary | ICD-10-CM | POA: Diagnosis present

## 2018-05-21 DIAGNOSIS — J441 Chronic obstructive pulmonary disease with (acute) exacerbation: Principal | ICD-10-CM | POA: Diagnosis present

## 2018-05-21 DIAGNOSIS — J449 Chronic obstructive pulmonary disease, unspecified: Secondary | ICD-10-CM | POA: Diagnosis present

## 2018-05-21 DIAGNOSIS — F17210 Nicotine dependence, cigarettes, uncomplicated: Secondary | ICD-10-CM | POA: Diagnosis present

## 2018-05-21 DIAGNOSIS — F418 Other specified anxiety disorders: Secondary | ICD-10-CM | POA: Diagnosis present

## 2018-05-21 DIAGNOSIS — N3281 Overactive bladder: Secondary | ICD-10-CM

## 2018-05-21 DIAGNOSIS — Z23 Encounter for immunization: Secondary | ICD-10-CM

## 2018-05-21 DIAGNOSIS — Z90710 Acquired absence of both cervix and uterus: Secondary | ICD-10-CM

## 2018-05-21 DIAGNOSIS — R0602 Shortness of breath: Secondary | ICD-10-CM | POA: Diagnosis not present

## 2018-05-21 DIAGNOSIS — R918 Other nonspecific abnormal finding of lung field: Secondary | ICD-10-CM | POA: Diagnosis present

## 2018-05-21 DIAGNOSIS — J40 Bronchitis, not specified as acute or chronic: Secondary | ICD-10-CM

## 2018-05-21 DIAGNOSIS — J9621 Acute and chronic respiratory failure with hypoxia: Secondary | ICD-10-CM

## 2018-05-21 DIAGNOSIS — F419 Anxiety disorder, unspecified: Secondary | ICD-10-CM | POA: Diagnosis present

## 2018-05-21 DIAGNOSIS — Z720 Tobacco use: Secondary | ICD-10-CM | POA: Diagnosis present

## 2018-05-21 DIAGNOSIS — F329 Major depressive disorder, single episode, unspecified: Secondary | ICD-10-CM | POA: Diagnosis present

## 2018-05-21 DIAGNOSIS — J9601 Acute respiratory failure with hypoxia: Secondary | ICD-10-CM

## 2018-05-21 DIAGNOSIS — D72829 Elevated white blood cell count, unspecified: Secondary | ICD-10-CM | POA: Diagnosis present

## 2018-05-21 DIAGNOSIS — R823 Hemoglobinuria: Secondary | ICD-10-CM | POA: Diagnosis present

## 2018-05-21 DIAGNOSIS — Z79899 Other long term (current) drug therapy: Secondary | ICD-10-CM

## 2018-05-21 DIAGNOSIS — Z7951 Long term (current) use of inhaled steroids: Secondary | ICD-10-CM

## 2018-05-21 DIAGNOSIS — R0902 Hypoxemia: Secondary | ICD-10-CM

## 2018-05-21 LAB — COMPREHENSIVE METABOLIC PANEL
ALK PHOS: 73 U/L (ref 38–126)
ALT: 21 U/L (ref 0–44)
ANION GAP: 9 (ref 5–15)
AST: 18 U/L (ref 15–41)
Albumin: 4 g/dL (ref 3.5–5.0)
BUN: 10 mg/dL (ref 6–20)
CALCIUM: 9.1 mg/dL (ref 8.9–10.3)
CO2: 26 mmol/L (ref 22–32)
Chloride: 104 mmol/L (ref 98–111)
Creatinine, Ser: 0.84 mg/dL (ref 0.44–1.00)
GFR calc Af Amer: >60 mL/min (ref >60)
GFR calc non Af Amer: >60 mL/min (ref >60)
GLUCOSE: 112 mg/dL — AB (ref 70–99)
Potassium: 3.7 mmol/L (ref 3.5–5.1)
SODIUM: 139 mmol/L (ref 135–145)
Total Bilirubin: 0.8 mg/dL (ref 0.3–1.2)
Total Protein: 7.2 g/dL (ref 6.5–8.1)

## 2018-05-21 LAB — URINALYSIS, ROUTINE W REFLEX MICROSCOPIC
BACTERIA UA: NONE SEEN
Bilirubin Urine: NEGATIVE
Glucose, UA: NEGATIVE mg/dL
Ketones, ur: NEGATIVE mg/dL
Leukocytes, UA: NEGATIVE
Nitrite: NEGATIVE
Protein, ur: NEGATIVE mg/dL
SPECIFIC GRAVITY, URINE: 1.01 (ref 1.005–1.030)
pH: 7 (ref 5.0–8.0)

## 2018-05-21 LAB — CBC WITH DIFFERENTIAL/PLATELET
BASOS PCT: 0 %
Basophils Absolute: 0 10*3/uL (ref 0.0–0.1)
EOS PCT: 1 %
Eosinophils Absolute: 0.1 10*3/uL (ref 0.0–0.7)
HEMATOCRIT: 43.9 % (ref 36.0–46.0)
Hemoglobin: 15.2 g/dL — ABNORMAL HIGH (ref 12.0–15.0)
LYMPHS PCT: 16 %
Lymphs Abs: 3.6 10*3/uL (ref 0.7–4.0)
MCH: 31 pg (ref 26.0–34.0)
MCHC: 34.6 g/dL (ref 30.0–36.0)
MCV: 89.4 fL (ref 78.0–100.0)
MONO ABS: 1.3 10*3/uL — AB (ref 0.1–1.0)
MONOS PCT: 6 %
NEUTROS ABS: 17.1 10*3/uL — AB (ref 1.7–7.7)
Neutrophils Relative %: 77 %
Platelets: 292 10*3/uL (ref 150–400)
RBC: 4.91 MIL/uL (ref 3.87–5.11)
RDW: 13.2 % (ref 11.5–15.5)
WBC: 22.1 10*3/uL — ABNORMAL HIGH (ref 4.0–10.5)

## 2018-05-21 LAB — TROPONIN I

## 2018-05-21 MED ORDER — IOPAMIDOL (ISOVUE-370) INJECTION 76%
100.0000 mL | Freq: Once | INTRAVENOUS | Status: AC | PRN
Start: 2018-05-21 — End: 2018-05-21
  Administered 2018-05-21: 100 mL via INTRAVENOUS

## 2018-05-21 MED ORDER — MAGNESIUM SULFATE 4 GM/100ML IV SOLN
4.0000 g | Freq: Once | INTRAVENOUS | Status: AC
  Administered 2018-05-21: 4 g via INTRAVENOUS
  Filled 2018-05-21: qty 100

## 2018-05-21 MED ORDER — DOXYCYCLINE HYCLATE 100 MG PO TABS
100.0000 mg | ORAL_TABLET | Freq: Once | ORAL | Status: AC
  Administered 2018-05-21: 100 mg via ORAL
  Filled 2018-05-21: qty 1

## 2018-05-21 MED ORDER — POTASSIUM CHLORIDE CRYS ER 20 MEQ PO TBCR
20.0000 meq | EXTENDED_RELEASE_TABLET | Freq: Once | ORAL | Status: AC
  Administered 2018-05-21: 20 meq via ORAL
  Filled 2018-05-21: qty 1

## 2018-05-21 MED ORDER — PREDNISONE 50 MG PO TABS
60.0000 mg | ORAL_TABLET | Freq: Once | ORAL | Status: AC
  Administered 2018-05-21: 60 mg via ORAL
  Filled 2018-05-21: qty 1

## 2018-05-21 MED ORDER — ACETAMINOPHEN 650 MG RE SUPP: 650.0000 mg | Freq: Four times a day (QID) | RECTAL | Status: DC | PRN

## 2018-05-21 MED ORDER — IPRATROPIUM-ALBUTEROL 0.5-2.5 (3) MG/3ML IN SOLN
3.0000 mL | RESPIRATORY_TRACT | Status: AC
  Administered 2018-05-21 (×3): 3 mL via RESPIRATORY_TRACT
  Filled 2018-05-21: qty 9

## 2018-05-21 MED ORDER — ALBUTEROL (5 MG/ML) CONTINUOUS INHALATION SOLN
10.0000 mg/h | INHALATION_SOLUTION | RESPIRATORY_TRACT | Status: DC
  Administered 2018-05-21: 10 mg/h via RESPIRATORY_TRACT
  Filled 2018-05-21: qty 20

## 2018-05-21 MED ORDER — ONDANSETRON HCL 4 MG PO TABS: 4.0000 mg | ORAL_TABLET | Freq: Four times a day (QID) | ORAL | Status: DC | PRN

## 2018-05-21 MED ORDER — METHYLPREDNISOLONE SODIUM SUCC 40 MG IJ SOLR
40.0000 mg | Freq: Four times a day (QID) | INTRAMUSCULAR | Status: DC
  Administered 2018-05-22 (×3): 40 mg via INTRAVENOUS
  Filled 2018-05-21 (×3): qty 1

## 2018-05-21 MED ORDER — ACETAMINOPHEN 325 MG PO TABS: 650.0000 mg | ORAL_TABLET | Freq: Four times a day (QID) | ORAL | Status: DC | PRN

## 2018-05-21 MED ORDER — ENOXAPARIN SODIUM 40 MG/0.4ML ~~LOC~~ SOLN
40.0000 mg | SUBCUTANEOUS | Status: DC
  Administered 2018-05-22: 40 mg via SUBCUTANEOUS
  Filled 2018-05-21: qty 0.4

## 2018-05-21 MED ORDER — LACTATED RINGERS IV BOLUS
1000.0000 mL | Freq: Once | INTRAVENOUS | Status: AC
  Administered 2018-05-21: 1000 mL via INTRAVENOUS

## 2018-05-21 MED ORDER — ONDANSETRON HCL 4 MG/2ML IJ SOLN: 4.0000 mg | Freq: Four times a day (QID) | INTRAMUSCULAR | Status: DC | PRN

## 2018-05-21 NOTE — ED Triage Notes (Signed)
Pt reports intermittent CP with SOB for 3-4 days. Also states she feels like she has a fever, has not checked it at home.

## 2018-05-21 NOTE — H&P (Signed)
History and Physical    Alicia Sanchez GNO:[id number] DOB: 08/24/1961 DOA: 05/21/2018  PCP: Terald Sleeper, PA-C   Patient coming from: Home.  I have personally briefly reviewed patient's old medical records in [place]  Chief Complaint: Shortness of breath.  HPI: Alicia Sanchez is a 57 y.o. female with medical history significant of anxiety, tobacco use, COPD, pulmonary nodules who is coming to the emergency department due to progressively worse dyspnea associated with fever, chills, night sweats, fatigue, pleuritic chest pain, wheezing and increased sputum production for the past 3 days.  Positive subjective fever, chills, pleuritic chest pain fatigue and malaise.  No sore throat, no hemoptysis, dizziness, diaphoresis, PND, orthopnea or pitting edema of the lower extremities.  No abdominal pain, nausea, emesis, diarrhea, constipation.  No dysuria, frequency or hematuria.  Denies polyuria, polydipsia or blurred vision.  No skin rashes or pruritus.  ED Course: Initial vital signs temperature 98 F, pulse 115, respirations 22, blood pressure 123/92 mmHg and O2 sat 95% on room air.  Given supplemental oxygen, bronchodilators, methylprednisolone in the emergency department.  Lab work shows an urinalysis with large hemoglobinuria, but otherwise negative.  Her chest radiograph did not show any acute cardiopulmonary pathology.  There was some bronchitic changes.  White count of 22.1 77% neutrophils, hemoglobin 15.2 and platelets 292.  His CMP shows a blood glucose level of 112 mg/dL, but all other values were normal.  Troponin less than 0.03 ng/mL.  CT did not show any new evidence of active pulmonary disease or pulmonary embolus.  However there were scattered pulmonary nodules X month follow-up was suggested.  Please see images and full radiology report for further detail.  Review of Systems: As per HPI otherwise 10 point review of systems negative.   Past Medical History:  Diagnosis Date  .  Anxiety   . COPD (chronic obstructive pulmonary disease) ([place])     Past Surgical History:  Procedure Laterality Date  . ABDOMINAL HYSTERECTOMY       reports that she has been smoking cigarettes.  She has a 63.00 pack-year smoking history. She has never used smokeless tobacco. She reports that she does not drink alcohol or use drugs.  No Known Allergies  Family History  Problem Relation Age of Onset  . COPD Mother   . COPD Father   . Diabetes type II Sister   . Diabetes type II Brother   . COPD Maternal Grandfather     Prior to Admission medications   Medication Sig Start Date End Date Taking? Authorizing Provider  albuterol (PROVENTIL HFA;VENTOLIN HFA) 108 (90 Base) MCG/ACT inhaler Inhale 2 puffs into the lungs every 6 (six) hours as needed for wheezing or shortness of breath. 12/14/16   Terald Sleeper, PA-C  ALPRAZolam Duanne Moron) 0.5 MG tablet Take 1 tablet (0.5 mg total) by mouth 2 (two) times daily as needed. for anxiety 12/31/17   Terald Sleeper, PA-C  baclofen (LIORESAL) 20 MG tablet Take 20 mg by mouth 2 (two) times daily. ~not her rx, takes her spouse's rx~    [provider]  busPIRone (BUSPAR) 10 MG tablet TAKE 1 TABLET BY MOUTH THREE TIMES DAILY 03/03/18   Terald Sleeper, PA-C  doxepin (SINEQUAN) 25 MG capsule TAKE 1 TO 2 CAPSULES BY MOUTH ONCE DAILY AT BEDTIME 04/28/18   Terald Sleeper, PA-C  DULoxetine (CYMBALTA) 60 MG capsule TAKE 2 CAPSULES BY MOUTH ONCE DAILY 03/28/18   Terald Sleeper, PA-C  fluticasone furoate-vilanterol (BREO ELLIPTA) 200-25  MCG/INH AEPB Inhale 1 puff into the lungs daily. 12/31/17   Terald Sleeper, PA-C  INCRUSE ELLIPTA 62.5 MCG/INH AEPB INHALE 1 PUFF BY MOUTH ONCE DAILY 01/16/18   Terald Sleeper, PA-C  ipratropium-albuterol (DUONEB) 0.5-2.5 (3) MG/3ML SOLN Take 3 mLs every 6 (six) hours as needed by nebulization. 09/25/17   Terald Sleeper, PA-C  Multiple Vitamin (MULTIVITAMIN WITH MINERALS) TABS tablet Take 1 tablet by mouth daily.    [provider]  nicotine (NICODERM CQ - DOSED IN MG/24 HOURS) 21 mg/24hr patch Place 1 patch (21 mg total) onto the skin daily. 09/16/17   Mannam, Hart Robinsons, MD  oxybutynin (DITROPAN-XL) 10 MG 24 hr tablet TAKE 1 TABLET BY MOUTH AT BEDTIME FOR  OVERACTIVE  BLADDER 05/21/18   Terald Sleeper, PA-C  Respiratory Therapy Supplies (NEBULIZER/TUBING/MOUTHPIECE) KIT 1 Units 4 (four) times daily by Does not apply route. GIVE face mask and tubing for nebulizer 09/25/17   Terald Sleeper, PA-C  rOPINIRole (REQUIP) 0.5 MG tablet TAKE 1 TO 2 TABLETS BY MOUTH AT BEDTIME 05/01/18   Terald Sleeper, PA-C  rOPINIRole (REQUIP) 1 MG tablet TAKE 1 TO 4 TABLETS BY MOUTH AT BEDTIME 12/26/17   Terald Sleeper, PA-C    Physical Exam: Vitals:   05/21/18 2031 05/21/18 2033 05/21/18 2140 05/21/18 2230  BP:   126/60 (!) 113/57  Pulse:   (!) 120 (!) 126  Resp:   20 (!) 21  Temp:      TempSrc:      SpO2: 98% 98% 92% 93%  Weight:      Height:        Constitutional: NAD, calm, comfortable Eyes: PERRL, lids and conjunctivae normal ENMT: Mucous membranes are moist. Posterior pharynx clear of any exudate or lesions. Neck: normal, supple, no masses, no thyromegaly Respiratory: Decreased breath sounds with rhonchi and wheezing bilaterally, no crackles. Normal respiratory effort. No accessory muscle use.  Cardiovascular: Regular rate and rhythm, no murmurs / rubs / gallops. No extremity edema. 2+ pedal pulses. No carotid bruits.  Abdomen: Obese, soft, no tenderness, no masses palpated. No hepatosplenomegaly. Bowel sounds positive.  Musculoskeletal: no clubbing / cyanosis. Good ROM, no contractures. Normal muscle tone.  Skin: Scattered small wounds and abrasions on patient's extremities. Neurologic: CN 2-12 grossly intact. Sensation intact, DTR normal. Strength 5/5 in all 4.  Psychiatric: Normal judgment and insight. Alert and oriented x 3. Normal mood.    Labs on Admission: I have personally reviewed following labs and imaging  studies  CBC: Recent Labs  Lab 05/21/18 1906  WBC 22.1*  NEUTROABS 17.1*  HGB 15.2*  HCT 43.9  MCV 89.4  PLT 696   Basic Metabolic Panel: Recent Labs  Lab 05/21/18 1906  NA 139  K 3.7  CL 104  CO2 26  GLUCOSE 112*  BUN 10  CREATININE 0.84  CALCIUM 9.1   GFR: Estimated Creatinine Clearance: 78.5 mL/min (by C-G formula based on SCr of 0.84 mg/dL). Liver Function Tests: Recent Labs  Lab 05/21/18 1906  AST 18  ALT 21  ALKPHOS 73  BILITOT 0.8  PROT 7.2  ALBUMIN 4.0   No results for input(s): LIPASE, AMYLASE in the last 168 hours. No results for input(s): AMMONIA in the last 168 hours. Coagulation Profile: No results for input(s): INR, PROTIME in the last 168 hours. Cardiac Enzymes: Recent Labs  Lab 05/21/18 1906  TROPONINI <0.03   BNP (last 3 results) No results for input(s): PROBNP in the last 8760 hours.  HbA1C: No results for input(s): HGBA1C in the last 72 hours. CBG: No results for input(s): GLUCAP in the last 168 hours. Lipid Profile: No results for input(s): CHOL, HDL, LDLCALC, TRIG, CHOLHDL, LDLDIRECT in the last 72 hours. Thyroid Function Tests: No results for input(s): TSH, T4TOTAL, FREET4, T3FREE, THYROIDAB in the last 72 hours. Anemia Panel: No results for input(s): VITAMINB12, FOLATE, FERRITIN, TIBC, IRON, RETICCTPCT in the last 72 hours. Urine analysis:    Component Value Date/Time   COLORURINE YELLOW 05/21/2018 1906   APPEARANCEUR CLEAR 05/21/2018 1906   APPEARANCEUR Clear 11/25/2017 1215   LABSPEC 1.010 05/21/2018 1906   PHURINE 7.0 05/21/2018 1906   GLUCOSEU NEGATIVE 05/21/2018 1906   HGBUR LARGE (A) 05/21/2018 1906   BILIRUBINUR NEGATIVE 05/21/2018 1906   BILIRUBINUR Negative 11/25/2017 1215   [place] 05/21/2018 1906   PROTEINUR NEGATIVE 05/21/2018 1906   UROBILINOGEN 0.2 12/12/2012 1945   NITRITE NEGATIVE 05/21/2018 1906   LEUKOCYTESUR NEGATIVE 05/21/2018 1906   LEUKOCYTESUR Negative 11/25/2017 1215     Radiological Exams on Admission: Dg Chest 2 View  Result Date: 05/21/2018 CLINICAL DATA:  Intermittent chest pain and short of breath EXAM: CHEST - 2 VIEW COMPARISON:  CT 05/08/2017, radiograph 04/25/2017 FINDINGS: Bronchitic changes are present. No focal consolidation or pleural effusion. Normal heart size. No pneumothorax. IMPRESSION: No active cardiopulmonary disease.  Bronchitic changes. Electronically Signed   By: Donavan Foil M.D.   On: 05/21/2018 19:26   Ct Angio Chest Pe W And/or Wo Contrast  Result Date: 05/21/2018 CLINICAL DATA:  Intermittent chest pain and shortness of breath for 4 days. Fever. EXAM: CT ANGIOGRAPHY CHEST WITH CONTRAST TECHNIQUE: Multidetector CT imaging of the chest was performed using the standard protocol during bolus administration of intravenous contrast. Multiplanar CT image reconstructions and MIPs were obtained to evaluate the vascular anatomy. CONTRAST:  150m ISOVUE-370 IOPAMIDOL (ISOVUE-370) INJECTION 76% COMPARISON:  05/08/2017 FINDINGS: Cardiovascular: Good opacification of the central and segmental pulmonary arteries. No focal filling defects. No evidence of significant pulmonary embolus. Normal heart size. No pericardial effusion. Normal caliber thoracic aorta. No aortic dissection. Great vessel origins are patent. Mediastinum/Nodes: Esophagus is decompressed. No significant lymphadenopathy in the chest. Thyroid gland is unremarkable. Lungs/Pleura: Motion artifact limits examination. No airspace disease or consolidation in the lungs. No pleural effusions. No pneumothorax. Airways are patent. Mild emphysematous changes in the lungs. Scattered pulmonary nodules. Largest is in the superior segment of the right lower lung measuring less than 4 mm diameter. This nodule was not present previously and could represent developing nodule. Six-month follow-up recommended. Upper Abdomen: No acute abnormality. Musculoskeletal: No chest wall abnormality. No acute or  significant osseous findings. Review of the MIP images confirms the above findings. IMPRESSION: No evidence of significant pulmonary embolus. No evidence of active pulmonary disease. Scattered pulmonary nodules, largest measuring less than 4 mm diameter. This nodule is new since previous study and may represent developing nodule. Six-month follow-up study is recommended. Emphysema (ICD10-J43.9). Electronically Signed   By: WLucienne CapersM.D.   On: 05/21/2018 21:42    EKG: Independently reviewed.  Vent. rate 121 BPM PR interval 120 ms QRS duration 60 ms QT/QTc 308/437 ms P-R-T axes 80 114 77 Sinus tachycardia Biatrial enlargement Right axis deviation Pulmonary disease pattern Abnormal ECG  Assessment/Plan Principal Problem:   COPD exacerbation (HCC) Admit to stepdown for close monitoring. Continue supplemental oxygen. Continue bronchodilators. Solu-Medrol 40 mg IVP every 6 hours.  Active Problems:   Leukocytosis Follow-up WBC.    Tobacco abuse Nicotine  replacement therapy ordered. Staff to provide tobacco cessation information.    Depression with anxiety Continue Cymbalta 120 mg p.o. daily. Continue BuSpar 10 mg p.o. 3 times daily. Alprazolam 0.5 mg p.o. twice daily as needed.    Pulmonary nodules. Advised to have imaging follow up as indicated.   DVT prophylaxis: Lovenox SQ. Code Status: Full code. Family Communication: Her daughter was present in the room. Disposition Plan: Admit to stepdown for COPD exacerbation treatment. Consults called:  Admission status: Observation/stepdown.   Reubin Milan MD [facility] Pager [phone].  If 7PM-7AM, please contact night-coverage [email or web address] Password [email or web address]  05/21/2018, 11:11 PM

## 2018-05-21 NOTE — ED Provider Notes (Signed)
Emergency Department Provider Note   I have reviewed the triage vital signs and the nursing notes.   HISTORY  Chief Complaint Chest Pain   HPI Alicia Sanchez is a 57 y.o. female history of COPD and anxiety the presents emergency department today with a pleuritic style chest pain has been worse with her coughing the last few days.  She has had some productive cough and shortness of breath with it as well.  States she is felt a fever and has had intermittent swelling episodes well.  She still smokes.  States her heart rate so is a bit high.  No recent illnesses.  No recent long car rides.  No lower extremity swelling. No other associated or modifying symptoms.    Past Medical History:  Diagnosis Date  . Anxiety   . COPD (chronic obstructive pulmonary disease) [facility])     Patient Active Problem List   Diagnosis Date Noted  . Acute cystitis with hematuria 08/21/2017  . Lung nodules 08/21/2017  . COPD (chronic obstructive pulmonary disease) with emphysema (HCC) 08/21/2017  . Recurrent UTI 08/21/2017  . Generalized anxiety disorder 03/22/2017  . Moderate episode of recurrent major depressive disorder (HCC) 12/16/2016  . Restless legs 12/16/2016  . OAB (overactive bladder) 12/16/2016  . COPD exacerbation (HCC) 11/19/2015  . Acute URI 11/19/2015  . Leukocytosis 11/19/2015  . 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 Tobacco abuse 11/19/2015  . Depression with anxiety 11/19/2015    Past Surgical History:  Procedure Laterality Date  . ABDOMINAL HYSTERECTOMY      Current Outpatient Rx  . Order #: [id number] Class: Normal  . Order #: [id number] Class: Normal  . Order #: [id number] Class: Historical Med  . Order #: [id number] Class: Normal  . Order #: [id number] Class: Normal  . Order #: [id number] Class: Normal  . Order #: [id number] Class: Normal  . Order #: [id number] Class: Normal  . Order #: [id number] Class: Normal  . Order #: [id number] Class: Historical Med  . Order #: [id number] Class: Normal  .  Order #: [id number] Class: Normal  . Order #: [id number] Class: Print  . Order #: [id number] Class: Normal  . Order #: [id number] Class: Normal    Allergies Patient has no known allergies.  Family History  Problem Relation Age of Onset  . COPD Mother   . COPD Father   . Diabetes type II Sister   . Diabetes type II Brother   . COPD Maternal Grandfather     Social History Social History   Tobacco Use  . Smoking status: Current Every Day Smoker    Packs/day: 1.50    Years: 42.00    Pack years: 63.00    Types: Cigarettes  . Smokeless tobacco: Never Used  Substance Use Topics  . Alcohol use: No  . Drug use: No    Review of Systems  All other systems negative except as documented in the HPI. All pertinent positives and negatives as reviewed in the HPI. ____________________________________________   PHYSICAL EXAM:  VITAL SIGNS: ED Triage Vitals [05/21/18 1903]  Enc Vitals Group     BP (!) 123/92     Pulse Rate (!) 115     Resp (!) 22     Temp 98 F (36.7 C)     Temp Source Temporal     SpO2 95 %     Weight 190 lb (86.2 kg)     Height 5\' 4"  (1.626 m)    Constitutional: Alert and oriented. Well appearing and in no acute distress. Eyes: Conjunctivae are normal. PERRL. EOMI. Head: Atraumatic. Nose:  No congestion/rhinnorhea. Mouth/Throat: Mucous membranes are moist.  Oropharynx non-erythematous. Neck: No stridor.  No meningeal signs.   Cardiovascular: tachycardic rate, regular rhythm. Good peripheral circulation. Grossly normal heart sounds.   Respiratory: tachypneic respiratory effort.  No retractions. Lungs diminished with wheezing. Gastrointestinal: Soft and nontender. No distention.  Musculoskeletal: No lower extremity tenderness nor edema. No gross deformities of extremities. Neurologic:  Normal speech and language. No gross focal neurologic deficits are appreciated.  Skin:  Skin is warm, dry and intact. No rash  noted.  ____________________________________________   LABS (all labs ordered are listed, but only abnormal results are displayed)  Labs Reviewed  COMPREHENSIVE METABOLIC PANEL - Abnormal; Notable for the following components:      Result Value   Glucose, Bld 112 (*)    All other components within normal limits  CBC WITH DIFFERENTIAL/PLATELET - Abnormal; Notable for the following components:   WBC 22.1 (*)    Hemoglobin 15.2 (*)    Neutro Abs 17.1 (*)    Monocytes Absolute 1.3 (*)    All other components within normal limits  URINALYSIS, ROUTINE W REFLEX MICROSCOPIC - Abnormal; Notable for the following components:   Hgb urine dipstick LARGE (*)    All other components within normal limits  TROPONIN I  TROPONIN I   ____________________________________________  EKG   EKG Interpretation  Date/Time:  Wednesday May 21 2018 19:04:13 EDT Ventricular Rate:  121 PR Interval:  120 QRS Duration: 60 QT Interval:  308 QTC Calculation: 437 R Axis:   114 Text Interpretation:  Sinus tachycardia Biatrial enlargement Right axis deviation Pulmonary disease pattern Abnormal ECG No significant change since last tracing Confirmed by Marily MemosMesner, Sheehan Stacey [phone]([id number]) on 05/21/2018 7:21:41 PM       ____________________________________________  RADIOLOGY  Dg Chest 2 View  Result Date: 05/21/2018 CLINICAL DATA:  Intermittent chest pain and short of breath EXAM: CHEST - 2 VIEW COMPARISON:  CT 05/08/2017, radiograph 04/25/2017 FINDINGS: Bronchitic changes are present. No focal consolidation or pleural effusion. Normal heart size. No pneumothorax. IMPRESSION: No active cardiopulmonary disease.  Bronchitic changes. Electronically Signed   By: Jasmine PangKim  Fujinaga M.D.   On: 05/21/2018 19:26   Ct Angio Chest Pe W And/or Wo Contrast  Result Date: 05/21/2018 CLINICAL DATA:  Intermittent chest pain and shortness of breath for 4 days. Fever. EXAM: CT ANGIOGRAPHY CHEST WITH CONTRAST TECHNIQUE: Multidetector CT imaging of  the chest was performed using the standard protocol during bolus administration of intravenous contrast. Multiplanar CT image reconstructions and MIPs were obtained to evaluate the vascular anatomy. CONTRAST:  100mL ISOVUE-370 IOPAMIDOL (ISOVUE-370) INJECTION 76% COMPARISON:  05/08/2017 FINDINGS: Cardiovascular: Good opacification of the central and segmental pulmonary arteries. No focal filling defects. No evidence of significant pulmonary embolus. Normal heart size. No pericardial effusion. Normal caliber thoracic aorta. No aortic dissection. Great vessel origins are patent. Mediastinum/Nodes: Esophagus is decompressed. No significant lymphadenopathy in the chest. Thyroid gland is unremarkable. Lungs/Pleura: Motion artifact limits examination. No airspace disease or consolidation in the lungs. No pleural effusions. No pneumothorax. Airways are patent. Mild emphysematous changes in the lungs. Scattered pulmonary nodules. Largest is in the superior segment of the right lower lung measuring less than 4 mm diameter. This nodule was not present previously and could represent developing nodule. Six-month follow-up recommended. Upper Abdomen: No acute abnormality. Musculoskeletal: No chest wall abnormality. No acute or significant osseous findings. Review of the MIP images confirms the above findings. IMPRESSION: No evidence of significant pulmonary embolus. No evidence of active pulmonary disease. Scattered  pulmonary nodules, largest measuring less than 4 mm diameter. This nodule is new since previous study and may represent developing nodule. Six-month follow-up study is recommended. Emphysema (ICD10-J43.9). Electronically Signed   By: Burman Nieves M.D.   On: 05/21/2018 21:42    ____________________________________________   PROCEDURES  Procedure(s) performed:   Procedures  CRITICAL CARE Performed by: Marily Memos Total critical care time: 35 minutes Critical care time was exclusive of separately  billable procedures and treating other patients. Critical care was necessary to treat or prevent imminent or life-threatening deterioration. Critical care was time spent personally by me on the following activities: development of treatment plan with patient and/or surrogate as well as nursing, discussions with consultants, evaluation of patient's response to treatment, examination of patient, obtaining history from patient or surrogate, ordering and performing treatments and interventions, ordering and review of laboratory studies, ordering and review of radiographic studies, pulse oximetry and re-evaluation of patient's condition.  ____________________________________________   INITIAL IMPRESSION / ASSESSMENT AND PLAN / ED COURSE  Suspect copd exacerbation. Will treat and eval for same.   After breathing treatments still significantly tachypneic. On ambulation, labored breathing hypoxic to low 80's. Will start continuous. Admit.    Pertinent labs & imaging results that were available during my care of the patient were reviewed by me and considered in my medical decision making (see chart for details).  ____________________________________________  FINAL CLINICAL IMPRESSION(S) / ED DIAGNOSES  Final diagnoses:  Bronchitis  Hypoxia  Acute respiratory failure with hypoxia (HCC)     MEDICATIONS GIVEN DURING THIS VISIT:  Medications  albuterol (PROVENTIL,VENTOLIN) solution continuous neb (has no administration in time range)  ipratropium-albuterol (DUONEB) 0.5-2.5 (3) MG/3ML nebulizer solution 3 mL (3 mLs Nebulization Given 05/21/18 2033)  doxycycline (VIBRA-TABS) tablet 100 mg (100 mg Oral Given 05/21/18 2029)  predniSONE (DELTASONE) tablet 60 mg (60 mg Oral Given 05/21/18 2030)  lactated ringers bolus 1,000 mL (1,000 mLs Intravenous New Bag/Given 05/21/18 2032)  iopamidol (ISOVUE-370) 76 % injection 100 mL (100 mLs Intravenous Contrast Given 05/21/18 2121)     NEW OUTPATIENT MEDICATIONS  STARTED DURING THIS VISIT:  New Prescriptions   No medications on file    Note:  This note was prepared with assistance of Dragon voice recognition software. Occasional wrong-word or sound-a-like substitutions may have occurred due to the inherent limitations of voice recognition software.   Marily Memos, MD 05/21/18 2251

## 2018-05-22 ENCOUNTER — Other Ambulatory Visit: Payer: Self-pay

## 2018-05-22 DIAGNOSIS — R823 Hemoglobinuria: Secondary | ICD-10-CM | POA: Diagnosis present

## 2018-05-22 DIAGNOSIS — F17210 Nicotine dependence, cigarettes, uncomplicated: Secondary | ICD-10-CM | POA: Diagnosis present

## 2018-05-22 DIAGNOSIS — R918 Other nonspecific abnormal finding of lung field: Secondary | ICD-10-CM | POA: Diagnosis present

## 2018-05-22 DIAGNOSIS — F418 Other specified anxiety disorders: Secondary | ICD-10-CM | POA: Diagnosis not present

## 2018-05-22 DIAGNOSIS — J9601 Acute respiratory failure with hypoxia: Secondary | ICD-10-CM | POA: Diagnosis present

## 2018-05-22 DIAGNOSIS — J441 Chronic obstructive pulmonary disease with (acute) exacerbation: Principal | ICD-10-CM

## 2018-05-22 DIAGNOSIS — D72829 Elevated white blood cell count, unspecified: Secondary | ICD-10-CM | POA: Diagnosis present

## 2018-05-22 DIAGNOSIS — G2581 Restless legs syndrome: Secondary | ICD-10-CM | POA: Diagnosis present

## 2018-05-22 DIAGNOSIS — Z79899 Other long term (current) drug therapy: Secondary | ICD-10-CM | POA: Diagnosis not present

## 2018-05-22 DIAGNOSIS — F419 Anxiety disorder, unspecified: Secondary | ICD-10-CM | POA: Diagnosis present

## 2018-05-22 DIAGNOSIS — J9621 Acute and chronic respiratory failure with hypoxia: Secondary | ICD-10-CM

## 2018-05-22 DIAGNOSIS — D72823 Leukemoid reaction: Secondary | ICD-10-CM | POA: Diagnosis not present

## 2018-05-22 DIAGNOSIS — Z7951 Long term (current) use of inhaled steroids: Secondary | ICD-10-CM | POA: Diagnosis not present

## 2018-05-22 DIAGNOSIS — F329 Major depressive disorder, single episode, unspecified: Secondary | ICD-10-CM | POA: Diagnosis present

## 2018-05-22 DIAGNOSIS — R0602 Shortness of breath: Secondary | ICD-10-CM | POA: Diagnosis present

## 2018-05-22 DIAGNOSIS — Z825 Family history of asthma and other chronic lower respiratory diseases: Secondary | ICD-10-CM | POA: Diagnosis not present

## 2018-05-22 DIAGNOSIS — Z720 Tobacco use: Secondary | ICD-10-CM

## 2018-05-22 DIAGNOSIS — Z90710 Acquired absence of both cervix and uterus: Secondary | ICD-10-CM | POA: Diagnosis not present

## 2018-05-22 DIAGNOSIS — Z23 Encounter for immunization: Secondary | ICD-10-CM | POA: Diagnosis not present

## 2018-05-22 DIAGNOSIS — E872 Acidosis: Secondary | ICD-10-CM | POA: Diagnosis present

## 2018-05-22 DIAGNOSIS — J449 Chronic obstructive pulmonary disease, unspecified: Secondary | ICD-10-CM | POA: Diagnosis present

## 2018-05-22 LAB — MRSA PCR SCREENING: MRSA BY PCR: NEGATIVE

## 2018-05-22 LAB — MAGNESIUM: Magnesium: 1.7 mg/dL (ref 1.7–2.4)

## 2018-05-22 LAB — CBC
HCT: 41.2 % (ref 36.0–46.0)
Hemoglobin: 14.3 g/dL (ref 12.0–15.0)
MCH: 30.9 pg (ref 26.0–34.0)
MCHC: 34.7 g/dL (ref 30.0–36.0)
MCV: 89 fL (ref 78.0–100.0)
PLATELETS: 271 10*3/uL (ref 150–400)
RBC: 4.63 MIL/uL (ref 3.87–5.11)
RDW: 13 % (ref 11.5–15.5)
WBC: 21.4 10*3/uL — ABNORMAL HIGH (ref 4.0–10.5)

## 2018-05-22 LAB — LACTIC ACID, PLASMA
Lactic Acid, Venous: 2.2 mmol/L (ref 0.5–1.9)
Lactic Acid, Venous: 3.3 mmol/L (ref 0.5–1.9)

## 2018-05-22 LAB — TROPONIN I: Troponin I: <0.03 ng/mL (ref <0.03)

## 2018-05-22 LAB — PROCALCITONIN

## 2018-05-22 MED ORDER — ROPINIROLE HCL 0.25 MG PO TABS
ORAL_TABLET | ORAL | Status: AC
  Filled 2018-05-22: qty 2

## 2018-05-22 MED ORDER — ROPINIROLE HCL 1 MG PO TABS
ORAL_TABLET | ORAL | Status: AC
  Filled 2018-05-22: qty 2

## 2018-05-22 MED ORDER — DIPHENHYDRAMINE HCL 25 MG PO CAPS
25.0000 mg | ORAL_CAPSULE | ORAL | Status: DC | PRN
  Administered 2018-05-22: 25 mg via ORAL
  Filled 2018-05-22: qty 1

## 2018-05-22 MED ORDER — METHYLPREDNISOLONE SODIUM SUCC 125 MG IJ SOLR
60.0000 mg | Freq: Four times a day (QID) | INTRAMUSCULAR | Status: DC
  Administered 2018-05-22 – 2018-05-23 (×5): 60 mg via INTRAVENOUS
  Filled 2018-05-22 (×5): qty 2

## 2018-05-22 MED ORDER — IPRATROPIUM-ALBUTEROL 0.5-2.5 (3) MG/3ML IN SOLN
3.0000 mL | Freq: Four times a day (QID) | RESPIRATORY_TRACT | Status: DC
  Administered 2018-05-22 – 2018-05-25 (×14): 3 mL via RESPIRATORY_TRACT
  Filled 2018-05-22 (×14): qty 3

## 2018-05-22 MED ORDER — ROPINIROLE HCL 1 MG PO TABS
2.5000 mg | ORAL_TABLET | Freq: Every day | ORAL | Status: DC
  Filled 2018-05-22: qty 1

## 2018-05-22 MED ORDER — PNEUMOCOCCAL VAC POLYVALENT 25 MCG/0.5ML IJ INJ
0.5000 mL | INJECTION | INTRAMUSCULAR | Status: AC
  Administered 2018-05-23: 0.5 mL via INTRAMUSCULAR
  Filled 2018-05-22: qty 0.5

## 2018-05-22 MED ORDER — DOXEPIN HCL 25 MG PO CAPS
50.0000 mg | ORAL_CAPSULE | Freq: Every day | ORAL | Status: DC
  Administered 2018-05-22 – 2018-05-24 (×3): 50 mg via ORAL
  Filled 2018-05-22 (×3): qty 2

## 2018-05-22 MED ORDER — SODIUM CHLORIDE 0.9 % IV BOLUS
1000.0000 mL | Freq: Once | INTRAVENOUS | Status: AC
  Administered 2018-05-22: 1000 mL via INTRAVENOUS

## 2018-05-22 MED ORDER — LEVOFLOXACIN IN D5W 750 MG/150ML IV SOLN
750.0000 mg | INTRAVENOUS | Status: DC
  Administered 2018-05-22: 750 mg via INTRAVENOUS
  Filled 2018-05-22: qty 150

## 2018-05-22 MED ORDER — BUSPIRONE HCL 5 MG PO TABS
10.0000 mg | ORAL_TABLET | Freq: Three times a day (TID) | ORAL | Status: DC
  Administered 2018-05-22 – 2018-05-25 (×10): 10 mg via ORAL
  Filled 2018-05-22 (×10): qty 2

## 2018-05-22 MED ORDER — DULOXETINE HCL 60 MG PO CPEP
120.0000 mg | ORAL_CAPSULE | Freq: Every day | ORAL | Status: DC
  Administered 2018-05-22 – 2018-05-25 (×4): 120 mg via ORAL
  Filled 2018-05-22 (×4): qty 2

## 2018-05-22 MED ORDER — ALPRAZOLAM 0.5 MG PO TABS
0.5000 mg | ORAL_TABLET | Freq: Two times a day (BID) | ORAL | Status: DC | PRN
  Administered 2018-05-22 – 2018-05-25 (×8): 0.5 mg via ORAL
  Filled 2018-05-22 (×8): qty 1

## 2018-05-22 MED ORDER — METHYLPREDNISOLONE SODIUM SUCC 125 MG IJ SOLR
125.0000 mg | Freq: Once | INTRAMUSCULAR | Status: AC
  Administered 2018-05-22: 125 mg via INTRAVENOUS
  Filled 2018-05-22: qty 2

## 2018-05-22 MED ORDER — OXYBUTYNIN CHLORIDE ER 5 MG PO TB24
10.0000 mg | ORAL_TABLET | Freq: Every day | ORAL | Status: DC
  Administered 2018-05-22 – 2018-05-24 (×3): 10 mg via ORAL
  Filled 2018-05-22 (×3): qty 2

## 2018-05-22 MED ORDER — AZITHROMYCIN 250 MG PO TABS
500.0000 mg | ORAL_TABLET | Freq: Every day | ORAL | Status: DC
  Administered 2018-05-23 – 2018-05-25 (×3): 500 mg via ORAL
  Filled 2018-05-22 (×4): qty 2

## 2018-05-22 MED ORDER — BACLOFEN 10 MG PO TABS: 20.0000 mg | ORAL_TABLET | Freq: Two times a day (BID) | ORAL | Status: DC

## 2018-05-22 MED ORDER — ROPINIROLE HCL 1 MG PO TABS
2.5000 mg | ORAL_TABLET | Freq: Every day | ORAL | Status: DC
  Administered 2018-05-22 – 2018-05-23 (×3): 2.5 mg via ORAL
  Filled 2018-05-22: qty 1
  Filled 2018-05-22 (×2): qty 2
  Filled 2018-05-22: qty 1

## 2018-05-22 MED ORDER — BUDESONIDE 0.5 MG/2ML IN SUSP
0.5000 mg | Freq: Two times a day (BID) | RESPIRATORY_TRACT | Status: DC
  Administered 2018-05-22 – 2018-05-25 (×6): 0.5 mg via RESPIRATORY_TRACT
  Filled 2018-05-22 (×6): qty 2

## 2018-05-22 MED ORDER — NICOTINE 21 MG/24HR TD PT24
21.0000 mg | MEDICATED_PATCH | Freq: Every day | TRANSDERMAL | Status: DC
  Administered 2018-05-22 – 2018-05-25 (×4): 21 mg via TRANSDERMAL
  Filled 2018-05-22 (×4): qty 1

## 2018-05-22 NOTE — Progress Notes (Signed)
Patient complaining of itching on wrists, legs, and back. Redness noted in these areas. Benadryl order given by MD. No other complaints at this time. Will continue to monitor.

## 2018-05-22 NOTE — Progress Notes (Signed)
Nutrition Note  Consult was received related to COPD protocol. Will be completed Friday.  Royann ShiversLynn Ronni Osterberg MS,RD,CSG,LDN Office: [phone] Pager: [phone]

## 2018-05-22 NOTE — Progress Notes (Signed)
PROGRESS NOTE  Alicia Sanchez ZOX:[id number] DOB: 07/21/1961 DOA: 05/21/2018 PCP: Remus LofflerJones, Angel S, PA-C  Brief History:  57 year old female with a history of COPD, tobacco abuse, restless leg syndrome, anxiety presenting with 3 to 4-day history of shortness of breath, coughing with sputum production, and subjective fevers and chills.  The patient denied any hemoptysis, vomiting or diarrhea.  She continues to smoke 1 pack/day.  Upon presentation, chest x-ray showed bronchitic changes.  CT angiogram of the chest was negative for pulmonary embolus and consolidation but showed scattered pulmonary nodules bilateral.  WBC was 22.1.  The patient was started on intravenous steroids and antibiotics.  The patient was noted to have oxygen saturation down to the 80s with ambulation on room air in the emergency department.  Assessment/Plan: Acute respiratory failure with hypoxia -Secondary to COPD exacerbation -Wean oxygen for saturation greater than 90% -Pulmonary hygiene  COPD exacerbation -Start Pulmicort -Continue IV Solu-Medrol--> increased to 60 mg IV every 6 hours -Continue duo nebs -Viral respiratory panel -start azithromycin  Tobacco abuse -Tobacco cessation discussed  Leukocytosis -Likely stress demargination -Check lactic acid -Check procalcitonin -Urinalysis negative for pyuria -CT angiogram chest negative for pulmonary embolus, negative consolidation  Anxiety/depression -Continue doxepin, Cymbalta, buspar -continue home dose alprazolam  Restless leg syndrome -Continue Requip  Pulmonary nodules -as per CTA chest 7/3 -outpt surveillance      Disposition Plan:   Home 7/5 or 7/6  Family Communication:   Daughter updated on phone  Consultants:  none  Code Status:  FULL   DVT Prophylaxis:   [place] Lovenox   Procedures: As Listed in Progress Note Above  Antibiotics: None    Subjective: Patient states that she is breathing better but still has a  nonproductive cough.  She denies any nausea, vomiting, diarrhea, chest pain, headache, neck pain.  There is no dysuria hematuria.  Objective: Vitals:   05/22/18 0729 05/22/18 0819 05/22/18 0900 05/22/18 1000  BP:   (!) 86/58 (!) 91/54  Pulse: 83  87 91  Resp: 19  18 (!) 25  Temp: 98.1 F (36.7 C)     TempSrc: Oral     SpO2: 91% 95% 92% 93%  Weight:      Height:        Intake/Output Summary (Last 24 hours) at 05/22/2018 1114 Last data filed at 05/22/2018 0445 Gross per 24 hour  Intake 1150 ml  Output -  Net 1150 ml   Weight change:  Exam:   General:  Pt is alert, follows commands appropriately, not in acute distress  HEENT: No icterus, No thrush, No neck mass, [place]/AT  Cardiovascular: RRR, S1/S2, no rubs, no gallops  Respiratory: Bilateral expiratory wheeze.  Bibasilar crackles.  No wheezing.  Abdomen: Soft/+BS, non tender, non distended, no guarding  Extremities: No edema, No lymphangitis, No petechiae, No rashes, no synovitis   Data Reviewed: I have personally reviewed following labs and imaging studies Basic Metabolic Panel: Recent Labs  Lab 05/21/18 1906 05/21/18 2308  NA 139  --   K 3.7  --   CL 104  --   CO2 26  --   GLUCOSE 112*  --   BUN 10  --   CREATININE 0.84  --   CALCIUM 9.1  --   MG  --  1.7   Liver Function Tests: Recent Labs  Lab 05/21/18 1906  AST 18  ALT 21  ALKPHOS 73  BILITOT 0.8  PROT 7.2  ALBUMIN  4.0   No results for input(s): LIPASE, AMYLASE in the last 168 hours. No results for input(s): AMMONIA in the last 168 hours. Coagulation Profile: No results for input(s): INR, PROTIME in the last 168 hours. CBC: Recent Labs  Lab 05/21/18 1906  WBC 22.1*  NEUTROABS 17.1*  HGB 15.2*  HCT 43.9  MCV 89.4  PLT 292   Cardiac Enzymes: Recent Labs  Lab 05/21/18 1906 05/21/18 2308  TROPONINI <0.03 <0.03   BNP: Invalid input(s): POCBNP CBG: No results for input(s): GLUCAP in the last 168 hours. HbA1C: No results for  input(s): HGBA1C in the last 72 hours. Urine analysis:    Component Value Date/Time   COLORURINE YELLOW 05/21/2018 1906   APPEARANCEUR CLEAR 05/21/2018 1906   APPEARANCEUR Clear 11/25/2017 1215   LABSPEC 1.010 05/21/2018 1906   PHURINE 7.0 05/21/2018 1906   GLUCOSEU NEGATIVE 05/21/2018 1906   HGBUR LARGE (A) 05/21/2018 1906   BILIRUBINUR NEGATIVE 05/21/2018 1906   BILIRUBINUR Negative 11/25/2017 1215   KETONESUR NEGATIVE 05/21/2018 1906   PROTEINUR NEGATIVE 05/21/2018 1906   UROBILINOGEN 0.2 12/12/2012 1945   NITRITE NEGATIVE 05/21/2018 1906   LEUKOCYTESUR NEGATIVE 05/21/2018 1906   LEUKOCYTESUR Negative 11/25/2017 1215   Sepsis Labs: @LABRCNTIP (procalcitonin:4,lacticidven:4) ) Recent Results (from the past 240 hour(s))  MRSA PCR Screening     Status: None   Collection Time: 05/21/18 11:58 PM  Result Value Ref Range Status   MRSA by PCR NEGATIVE NEGATIVE Final    Comment:        The GeneXpert MRSA Assay (FDA approved for NASAL specimens only), is one component of a comprehensive MRSA colonization surveillance program. It is not intended to diagnose MRSA infection nor to guide or monitor treatment for MRSA infections. Performed at [facility], [address], [place], [place] [address]      Scheduled Meds: . busPIRone  10 mg Oral TID  . doxepin  50 mg Oral QHS  . DULoxetine  120 mg Oral Daily  . enoxaparin (LOVENOX) injection  40 mg Subcutaneous Q24H  . ipratropium-albuterol  3 mL Nebulization Q6H  . methylPREDNISolone (SOLU-MEDROL) injection  40 mg Intravenous Q6H  . nicotine  21 mg Transdermal Daily  . oxybutynin  10 mg Oral QHS  . [START ON 05/23/2018] pneumococcal 23 valent vaccine  0.5 mL Intramuscular Tomorrow-1000  . rOPINIRole  2.5 mg Oral QHS   Continuous Infusions: . albuterol Stopped (05/22/18 0005)  . levofloxacin (LEVAQUIN) IV Stopped (05/22/18 0445)    Procedures/Studies: Dg Chest 2 View  Result Date: 05/21/2018 CLINICAL DATA:  Intermittent  chest pain and short of breath EXAM: CHEST - 2 VIEW COMPARISON:  CT 05/08/2017, radiograph 04/25/2017 FINDINGS: Bronchitic changes are present. No focal consolidation or pleural effusion. Normal heart size. No pneumothorax. IMPRESSION: No active cardiopulmonary disease.  Bronchitic changes. Electronically Signed   By: Jasmine Pang M.D.   On: 05/21/2018 19:26   Ct Angio Chest Pe W And/or Wo Contrast  Result Date: 05/21/2018 CLINICAL DATA:  Intermittent chest pain and shortness of breath for 4 days. Fever. EXAM: CT ANGIOGRAPHY CHEST WITH CONTRAST TECHNIQUE: Multidetector CT imaging of the chest was performed using the standard protocol during bolus administration of intravenous contrast. Multiplanar CT image reconstructions and MIPs were obtained to evaluate the vascular anatomy. CONTRAST:  ISOVUE-370 IOPAMIDOL (ISOVUE-370) INJECTION 76% COMPARISON:  05/08/2017 FINDINGS: Cardiovascular: Good opacification of the central and segmental pulmonary arteries. No focal filling defects. No evidence of significant pulmonary embolus. Normal heart size. No pericardial effusion. Normal caliber  thoracic aorta. No aortic dissection. Great vessel origins are patent. Mediastinum/Nodes: Esophagus is decompressed. No significant lymphadenopathy in the chest. Thyroid gland is unremarkable. Lungs/Pleura: Motion artifact limits examination. No airspace disease or consolidation in the lungs. No pleural effusions. No pneumothorax. Airways are patent. Mild emphysematous changes in the lungs. Scattered pulmonary nodules. Largest is in the superior segment of the right lower lung measuring less than 4 mm diameter. This nodule was not present previously and could represent developing nodule. Six-month follow-up recommended. Upper Abdomen: No acute abnormality. Musculoskeletal: No chest wall abnormality. No acute or significant osseous findings. Review of the MIP images confirms the above findings. IMPRESSION: No evidence of  significant pulmonary embolus. No evidence of active pulmonary disease. Scattered pulmonary nodules, largest measuring less than 4 mm diameter. This nodule is new since previous study and may represent developing nodule. Six-month follow-up study is recommended. Emphysema (ICD10-J43.9). Electronically Signed   By: Burman Nieves M.D.   On: 05/21/2018 21:42    Catarina Hartshorn, DO  [facility] Pager [phone]  If 7PM-7AM, please contact night-coverage [email or web address] Password [email or web address] 05/22/2018, 11:14 AM   LOS: 0 days

## 2018-05-22 NOTE — Progress Notes (Signed)
Initial Nutrition Assessment  DOCUMENTATION CODES:   Obesity unspecified  INTERVENTION:  Regular diet   OP Nutrition Education : Weight loss (if pt is ready)  NUTRITION DIAGNOSIS:   Food and nutrition related knowledge deficit related to limited prior education(Healthy balanced diet ) as evidenced by per patient/family report.   GOAL:  (Patient will follow up with outpatient RD for weight loss nutrition counseling)  MONITOR:   PO intake, Weight trends, Labs  REASON FOR ASSESSMENT:   Consult Assessment of nutrition requirement/status, COPD Protocol  ASSESSMENT:  Patient is a 57 yo who presents complaining of chest pain. She has a hx of COPD, Lung nodules, recurring UTI's, acute cystitis with hematuria.  Patient says her activity level is limited due to her breathing difficulty. She does not participate in exercise now but "used to".  Patient weight shows a 7 lb gain during the past 2 months. Usual weight 184-187 lb range. Her home diet is regular and she is eating well (75%) of meals here and endorses stable appetite at home. Her work schedule is 12:30- 9 pm daily. Breakfast is often oatmeal and a sandwich for lunch. She tries to eat dinner at work so as not eat a heavy meal late at night before bed. Patient is aware that she is at an unhealthy weight and says if I could just lose some weight my breathing my improve.   Labs: BMP Latest Ref Rng & Units 05/23/2018 05/21/2018 12/31/2017  Glucose 70 - 99 mg/dL 161(W172(H) 960(A112(H) 67  BUN 6 - 20 mg/dL 13 10 5(L)  Creatinine 0.44 - 1.00 mg/dL 5.400.87 9.810.84 1.910.75  BUN/Creat Ratio 9 - 23 - - 7(L)  Sodium 135 - 145 mmol/L 139 139 142  Potassium 3.5 - 5.1 mmol/L 4.2 3.7 4.3  Chloride 98 - 111 mmol/L 106 104 103  CO2 22 - 32 mmol/L 24 26 23   Calcium 8.9 - 10.3 mg/dL 4.7(W8.7(L) 9.1 9.4    Meds: . azithromycin  500 mg Oral Daily  . budesonide (PULMICORT) nebulizer solution  0.5 mg Nebulization BID  . busPIRone  10 mg Oral TID  . doxepin  50 mg Oral  QHS  . DULoxetine  120 mg Oral Daily  . ipratropium-albuterol  3 mL Nebulization Q6H  . methylPREDNISolone (SOLU-MEDROL) injection  60 mg Intravenous Q6H  . nicotine  21 mg Transdermal Daily  . oxybutynin  10 mg Oral QHS  . rOPINIRole  2.5 mg Oral QHS     Diet Order:   Diet Order           Diet regular Room service appropriate? Yes; Fluid consistency: Thin  Diet effective now         EDUCATION NEEDS:   Education needs have been addressed(for the acute setting) Skin:  Skin Assessment: Reviewed RN Assessment  Last BM:  unknown  Height:   Ht Readings from Last 1 Encounters:  05/22/18 5\' 4"  (1.626 m)    Weight:   Wt Readings from Last 1 Encounters:  05/22/18 192 lb 0.3 oz (87.1 kg)    Ideal Body Weight:  55 kg  BMI:  Body mass index is 32.96 kg/m.  Estimated Nutritional Needs:   Kcal:  1600-1750  Protein:  87-95 gr   Fluid:  >1600 ml daily   Royann ShiversLynn Dionte Blaustein MS,RD,CSG,LDN Office: [phone] Pager: [phone]

## 2018-05-23 ENCOUNTER — Ambulatory Visit (HOSPITAL_COMMUNITY): Payer: 59

## 2018-05-23 LAB — LACTIC ACID, PLASMA: LACTIC ACID, VENOUS: 3.2 mmol/L — AB (ref 0.5–1.9)

## 2018-05-23 LAB — CBC
HCT: 41.3 % (ref 36.0–46.0)
HEMOGLOBIN: 13.6 g/dL (ref 12.0–15.0)
MCH: 30.1 pg (ref 26.0–34.0)
MCHC: 32.9 g/dL (ref 30.0–36.0)
MCV: 91.4 fL (ref 78.0–100.0)
PLATELETS: 333 10*3/uL (ref 150–400)
RBC: 4.52 MIL/uL (ref 3.87–5.11)
RDW: 13.4 % (ref 11.5–15.5)
WBC: 33.9 10*3/uL — ABNORMAL HIGH (ref 4.0–10.5)

## 2018-05-23 LAB — BASIC METABOLIC PANEL
ANION GAP: 9 (ref 5–15)
BUN: 13 mg/dL (ref 6–20)
CALCIUM: 8.7 mg/dL — AB (ref 8.9–10.3)
CO2: 24 mmol/L (ref 22–32)
Chloride: 106 mmol/L (ref 98–111)
Creatinine, Ser: 0.87 mg/dL (ref 0.44–1.00)
GFR calc non Af Amer: >60 mL/min (ref >60)
GLUCOSE: 172 mg/dL — AB (ref 70–99)
Potassium: 4.2 mmol/L (ref 3.5–5.1)
Sodium: 139 mmol/L (ref 135–145)

## 2018-05-23 LAB — HIV ANTIBODY (ROUTINE TESTING W REFLEX): HIV SCREEN 4TH GENERATION: NONREACTIVE

## 2018-05-23 LAB — PROCALCITONIN

## 2018-05-23 LAB — MAGNESIUM: MAGNESIUM: 2.1 mg/dL (ref 1.7–2.4)

## 2018-05-23 MED ORDER — PREDNISONE 20 MG PO TABS
60.0000 mg | ORAL_TABLET | Freq: Every day | ORAL | Status: DC
  Administered 2018-05-24: 60 mg via ORAL
  Filled 2018-05-23: qty 3

## 2018-05-23 NOTE — Progress Notes (Signed)
PROGRESS NOTE  Alicia Sanchez ZOX:[id number] DOB: 11/24/1960 DOA: 05/21/2018 PCP: Remus LofflerJones, Angel S, PA-C  Brief History:  57 year old female with a history of COPD, tobacco abuse, restless leg syndrome, anxiety presenting with 3 to 4-day history of shortness of breath, coughing with sputum production, and subjective fevers and chills.  The patient denied any hemoptysis, vomiting or diarrhea.  She continues to smoke 1 pack/day.  Upon presentation, chest x-ray showed bronchitic changes.  CT angiogram of the chest was negative for pulmonary embolus and consolidation but showed scattered pulmonary nodules bilateral.  WBC was 22.1.  The patient was started on intravenous steroids and antibiotics.  The patient was noted to have oxygen saturation down to the 80s with ambulation on room air in the emergency department.  Assessment/Plan: Acute respiratory failure with hypoxia -Secondary to COPD exacerbation -Wean oxygen for saturation greater than 90% -Pulmonary hygiene  COPD exacerbation -continue Pulmicort -Continue IV Solu-Medrol--> increased to 60 mg IV every 6 hours>>>po prednisone on 7/6 -Continue duo nebs -Viral respiratory panel -continue azithromycin  Tobacco abuse -Tobacco cessation discussed  Leukocytosis -Likely stress demargination -Check lactic acid -Check procalcitonin -Urinalysis negative for pyuria -CT angiogram chest negative for pulmonary embolus, negative consolidation  Anxiety/depression -Continue doxepin, Cymbalta, buspar -continue home dose alprazolam  Restless leg syndrome -Continue Requip  Pulmonary nodules -as per CTA chest 7/3 -outpt surveillance      Disposition Plan:   Home  7/6 if stable Family Communication:   Daughter updated on phone 7/4  Consultants:  none  Code Status:  FULL   DVT Prophylaxis:   [place] Lovenox   Procedures: As Listed in Progress Note Above  Antibiotics: Doxy 7/3 Levofloxacin 7/4 azithro  7/5>>>      Subjective: Patient denies fevers, chills, headache, chest pain,nausea, vomiting, diarrhea, abdominal pain, dysuria, hematuria, hematochezia, and melena. Still had dyspnea with showering   Objective: Vitals:   05/23/18 0551 05/23/18 0732 05/23/18 0949 05/23/18 1306  BP: 92/61  112/67   Pulse: 95  (!) 112   Resp: 15  (!) 22   Temp: 98.2 F (36.8 C)  98.6 F (37 C)   TempSrc: Oral  Oral   SpO2:  91% 96% 96%  Weight:      Height:        Intake/Output Summary (Last 24 hours) at 05/23/2018 1808 Last data filed at 05/23/2018 0944 Gross per 24 hour  Intake 480 ml  Output -  Net 480 ml   Weight change:  Exam:   General:  Pt is alert, follows commands appropriately, not in acute distress  HEENT: No icterus, No thrush, No neck mass, [place]/AT  Cardiovascular: RRR, S1/S2, no rubs, no gallops  Respiratory: minimal basilar rales and wheeze  Abdomen: Soft/+BS, non tender, non distended, no guarding  Extremities: No edema, No lymphangitis, No petechiae, No rashes, no synovitis   Data Reviewed: I have personally reviewed following labs and imaging studies Basic Metabolic Panel: Recent Labs  Lab 05/21/18 1906 05/21/18 2308 05/23/18 0410  NA 139  --  139  K 3.7  --  4.2  CL 104  --  106  CO2 26  --  24  GLUCOSE 112*  --  172*  BUN 10  --  13  CREATININE 0.84  --  0.87  CALCIUM 9.1  --  8.7*  MG  --  1.7 2.1   Liver Function Tests: Recent Labs  Lab 05/21/18 1906  AST 18  ALT 21  ALKPHOS 73  BILITOT 0.8  PROT 7.2  ALBUMIN 4.0   No results for input(s): LIPASE, AMYLASE in the last 168 hours. No results for input(s): AMMONIA in the last 168 hours. Coagulation Profile: No results for input(s): INR, PROTIME in the last 168 hours. CBC: Recent Labs  Lab 05/21/18 1906 05/22/18 1139 05/23/18 0410  WBC 22.1* 21.4* 33.9*  NEUTROABS 17.1*  --   --   HGB 15.2* 14.3 13.6  HCT 43.9 41.2 41.3  MCV 89.4 89.0 91.4  PLT 292 271 333   Cardiac  Enzymes: Recent Labs  Lab 05/21/18 1906 05/21/18 2308  TROPONINI <0.03 <0.03   BNP: Invalid input(s): POCBNP CBG: No results for input(s): GLUCAP in the last 168 hours. HbA1C: No results for input(s): HGBA1C in the last 72 hours. Urine analysis:    Component Value Date/Time   COLORURINE YELLOW 05/21/2018 1906   APPEARANCEUR CLEAR 05/21/2018 1906   APPEARANCEUR Clear 11/25/2017 1215   LABSPEC 1.010 05/21/2018 1906   PHURINE 7.0 05/21/2018 1906   GLUCOSEU NEGATIVE 05/21/2018 1906   HGBUR LARGE (A) 05/21/2018 1906   BILIRUBINUR NEGATIVE 05/21/2018 1906   BILIRUBINUR Negative 11/25/2017 1215   KETONESUR NEGATIVE 05/21/2018 1906   PROTEINUR NEGATIVE 05/21/2018 1906   UROBILINOGEN 0.2 12/12/2012 1945   NITRITE NEGATIVE 05/21/2018 1906   LEUKOCYTESUR NEGATIVE 05/21/2018 1906   LEUKOCYTESUR Negative 11/25/2017 1215   Sepsis Labs: @LABRCNTIP (procalcitonin:4,lacticidven:4) ) Recent Results (from the past 240 hour(s))  MRSA PCR Screening     Status: None   Collection Time: 05/21/18 11:58 PM  Result Value Ref Range Status   MRSA by PCR NEGATIVE NEGATIVE Final    Comment:        The GeneXpert MRSA Assay (FDA approved for NASAL specimens only), is one component of a comprehensive MRSA colonization surveillance program. It is not intended to diagnose MRSA infection nor to guide or monitor treatment for MRSA infections. Performed at [facility], [address], [place], [place] [address]      Scheduled Meds: . azithromycin  500 mg Oral Daily  . budesonide (PULMICORT) nebulizer solution  0.5 mg Nebulization BID  . busPIRone  10 mg Oral TID  . doxepin  50 mg Oral QHS  . DULoxetine  120 mg Oral Daily  . ipratropium-albuterol  3 mL Nebulization Q6H  . methylPREDNISolone (SOLU-MEDROL) injection  60 mg Intravenous Q6H  . nicotine  21 mg Transdermal Daily  . oxybutynin  10 mg Oral QHS  . rOPINIRole  2.5 mg Oral QHS   Continuous Infusions: . albuterol Stopped (05/22/18  0005)    Procedures/Studies: Dg Chest 2 View  Result Date: 05/21/2018 CLINICAL DATA:  Intermittent chest pain and short of breath EXAM: CHEST - 2 VIEW COMPARISON:  CT 05/08/2017, radiograph 04/25/2017 FINDINGS: Bronchitic changes are present. No focal consolidation or pleural effusion. Normal heart size. No pneumothorax. IMPRESSION: No active cardiopulmonary disease.  Bronchitic changes. Electronically Signed   By: Jasmine Pang M.D.   On: 05/21/2018 19:26   Ct Angio Chest Pe W And/or Wo Contrast  Result Date: 05/21/2018 CLINICAL DATA:  Intermittent chest pain and shortness of breath for 4 days. Fever. EXAM: CT ANGIOGRAPHY CHEST WITH CONTRAST TECHNIQUE: Multidetector CT imaging of the chest was performed using the standard protocol during bolus administration of intravenous contrast. Multiplanar CT image reconstructions and MIPs were obtained to evaluate the vascular anatomy. CONTRAST:  ISOVUE-370 IOPAMIDOL (ISOVUE-370) INJECTION 76% COMPARISON:  05/08/2017 FINDINGS: Cardiovascular: Good opacification of the central and segmental pulmonary arteries. No  focal filling defects. No evidence of significant pulmonary embolus. Normal heart size. No pericardial effusion. Normal caliber thoracic aorta. No aortic dissection. Great vessel origins are patent. Mediastinum/Nodes: Esophagus is decompressed. No significant lymphadenopathy in the chest. Thyroid gland is unremarkable. Lungs/Pleura: Motion artifact limits examination. No airspace disease or consolidation in the lungs. No pleural effusions. No pneumothorax. Airways are patent. Mild emphysematous changes in the lungs. Scattered pulmonary nodules. Largest is in the superior segment of the right lower lung measuring less than 4 mm diameter. This nodule was not present previously and could represent developing nodule. Six-month follow-up recommended. Upper Abdomen: No acute abnormality. Musculoskeletal: No chest wall abnormality. No acute or significant  osseous findings. Review of the MIP images confirms the above findings. IMPRESSION: No evidence of significant pulmonary embolus. No evidence of active pulmonary disease. Scattered pulmonary nodules, largest measuring less than 4 mm diameter. This nodule is new since previous study and may represent developing nodule. Six-month follow-up study is recommended. Emphysema (ICD10-J43.9). Electronically Signed   By: Burman Nieves M.D.   On: 05/21/2018 21:42    Catarina Hartshorn, DO  [facility] Pager [phone]  If 7PM-7AM, please contact night-coverage [email or web address] Password [email or web address] 05/23/2018, 6:08 PM   LOS: 1 day

## 2018-05-24 DIAGNOSIS — D72823 Leukemoid reaction: Secondary | ICD-10-CM

## 2018-05-24 LAB — PROCALCITONIN: Procalcitonin: <0.1 ng/mL

## 2018-05-24 MED ORDER — ROPINIROLE HCL 1 MG PO TABS
1.0000 mg | ORAL_TABLET | Freq: Every day | ORAL | Status: DC
  Administered 2018-05-24: 1 mg via ORAL
  Filled 2018-05-24: qty 1

## 2018-05-24 MED ORDER — METHYLPREDNISOLONE SODIUM SUCC 125 MG IJ SOLR
60.0000 mg | Freq: Two times a day (BID) | INTRAMUSCULAR | Status: AC
  Administered 2018-05-25: 60 mg via INTRAVENOUS
  Filled 2018-05-24: qty 2

## 2018-05-24 MED ORDER — METHYLPREDNISOLONE SODIUM SUCC 125 MG IJ SOLR
60.0000 mg | Freq: Two times a day (BID) | INTRAMUSCULAR | Status: DC
  Administered 2018-05-24: 60 mg via INTRAVENOUS
  Filled 2018-05-24: qty 2

## 2018-05-24 MED ORDER — PREDNISONE 20 MG PO TABS
60.0000 mg | ORAL_TABLET | Freq: Every day | ORAL | Status: DC
  Administered 2018-05-25: 60 mg via ORAL
  Filled 2018-05-24: qty 3

## 2018-05-24 NOTE — Discharge Summary (Signed)
Physician Discharge Summary  Dyonna Jaspers GEZ:[id number] DOB: 1961/07/21 DOA: 05/21/2018  PCP: Terald Sleeper, PA-C  Admit date: 05/21/2018 Discharge date: 05/25/18  Admitted From: Home Disposition:  Home   Recommendations for Outpatient Follow-up:  1. Follow up with PCP in 1-2 weeks 2. Please obtain BMP/CBC in one week 3. Recommend PET scan of chest to help clarify risk for lung nodules     Discharge Condition: Stable CODE STATUS: FULL Diet recommendation:  Regular   Brief/Interim Summary: 57 year old female with a history of COPD, tobacco abuse, restless leg syndrome, anxiety presenting with 3 to 4-day history of shortness of breath, coughing with sputum production, and subjective fevers and chills. The patient denied any hemoptysis, vomiting or diarrhea. She continues to smoke 1 pack/day. Upon presentation, chest x-ray showed bronchitic changes. CT angiogram of the chest was negative for pulmonary embolus and consolidation but showed scattered pulmonary nodules bilateral. WBC was 22.1. The patient was started on intravenous steroids and antibiotics. The patient was noted to have oxygen saturation down to the 80s with ambulation on room air in the emergency department.  The patient was started on IV solumedrol and duonebs with clinical improvement.  On 05/24/18, pt had some increase sob and wheezing with weaning to prednisone; therefore, solumedrol restarted with improvement.  Ultimately pt is weaned back to prednisone.    Discharge Diagnoses:  Acute respiratory failure with hypoxia -Secondary to COPD exacerbation -Wean oxygen for saturation greater than 90% -Pulmonary hygiene -weaned oxygen to RA -ambulatory pulse ox on RA did not show desaturation <88%  COPD exacerbation -continuedPulmicort during hospitalization -05/24/18--restart IV Solu-Medrol as pt more sob and wheezing; delay discharge>>>improved -d/c home with prednisone taper -Continue duo nebs -resume Breo and  Incruse after d/c -Viral respiratory panel--pending at time of d/c -continuedazithromycin-->finished 5 days of abx during the hospitalization  Tobacco abuse -Tobacco cessation discussed  Leukocytosis -Likely stress demargination -Check procalcitonin--<0.10 -Urinalysis negative for pyuria -CT angiogram chest negative for pulmonary embolus, negative consolidation -am CBC with diff--improving without WBC precursors  Lactic Acidosis -not related to sepsis -likely due to hypoxia -afebrile and hemodynamically stable  Anxiety/depression -Continuedoxepin,Cymbalta, buspar -continue home dose alprazolam  Restless leg syndrome -Continue Requip  Pulmonary nodules -as per CTA chest 7/3 -outpt surveillance     Discharge Instructions   Allergies as of 05/25/2018   No Known Allergies     Medication List    TAKE these medications   albuterol 108 (90 Base) MCG/ACT inhaler Commonly known as:  PROVENTIL HFA;VENTOLIN HFA Inhale 2 puffs into the lungs every 6 (six) hours as needed for wheezing or shortness of breath.   ALPRAZolam 0.5 MG tablet Commonly known as:  XANAX Take 1 tablet (0.5 mg total) by mouth 2 (two) times daily as needed. for anxiety   busPIRone 10 MG tablet Commonly known as:  BUSPAR TAKE 1 TABLET BY MOUTH THREE TIMES DAILY   doxepin 25 MG capsule Commonly known as:  SINEQUAN TAKE 1 TO 2 CAPSULES BY MOUTH ONCE DAILY AT BEDTIME   DULoxetine 60 MG capsule Commonly known as:  CYMBALTA TAKE 2 CAPSULES BY MOUTH ONCE DAILY   INCRUSE ELLIPTA 62.5 MCG/INH Aepb Generic drug:  umeclidinium bromide INHALE 1 PUFF BY MOUTH ONCE DAILY   ipratropium-albuterol 0.5-2.5 (3) MG/3ML Soln Commonly known as:  DUONEB Take 3 mLs every 6 (six) hours as needed by nebulization.   multivitamin with minerals Tabs tablet Take 1 tablet by mouth daily.   Nebulizer/Tubing/Mouthpiece Kit 1 Units 4 (four) times daily by Does  not apply route. GIVE face mask and tubing for  nebulizer   oxybutynin 10 MG 24 hr tablet Commonly known as:  DITROPAN-XL TAKE 1 TABLET BY MOUTH AT BEDTIME FOR  OVERACTIVE  BLADDER   predniSONE 10 MG tablet Commonly known as:  DELTASONE Take 6 tablets (60 mg total) by mouth daily with breakfast. And decrease by one tablet daily Start taking on:  05/26/2018   rOPINIRole 0.5 MG tablet Commonly known as:  REQUIP TAKE 1 TO 2 TABLETS BY MOUTH AT BEDTIME       No Known Allergies  Consultations:  none   Procedures/Studies: Dg Chest 2 View  Result Date: 05/21/2018 CLINICAL DATA:  Intermittent chest pain and short of breath EXAM: CHEST - 2 VIEW COMPARISON:  CT 05/08/2017, radiograph 04/25/2017 FINDINGS: Bronchitic changes are present. No focal consolidation or pleural effusion. Normal heart size. No pneumothorax. IMPRESSION: No active cardiopulmonary disease.  Bronchitic changes. Electronically Signed   By: Donavan Foil M.D.   On: 05/21/2018 19:26   Ct Angio Chest Pe W And/or Wo Contrast  Result Date: 05/21/2018 CLINICAL DATA:  Intermittent chest pain and shortness of breath for 4 days. Fever. EXAM: CT ANGIOGRAPHY CHEST WITH CONTRAST TECHNIQUE: Multidetector CT imaging of the chest was performed using the standard protocol during bolus administration of intravenous contrast. Multiplanar CT image reconstructions and MIPs were obtained to evaluate the vascular anatomy. CONTRAST:  124m ISOVUE-370 IOPAMIDOL (ISOVUE-370) INJECTION 76% COMPARISON:  05/08/2017 FINDINGS: Cardiovascular: Good opacification of the central and segmental pulmonary arteries. No focal filling defects. No evidence of significant pulmonary embolus. Normal heart size. No pericardial effusion. Normal caliber thoracic aorta. No aortic dissection. Great vessel origins are patent. Mediastinum/Nodes: Esophagus is decompressed. No significant lymphadenopathy in the chest. Thyroid gland is unremarkable. Lungs/Pleura: Motion artifact limits examination. No airspace disease or  consolidation in the lungs. No pleural effusions. No pneumothorax. Airways are patent. Mild emphysematous changes in the lungs. Scattered pulmonary nodules. Largest is in the superior segment of the right lower lung measuring less than 4 mm diameter. This nodule was not present previously and could represent developing nodule. Six-month follow-up recommended. Upper Abdomen: No acute abnormality. Musculoskeletal: No chest wall abnormality. No acute or significant osseous findings. Review of the MIP images confirms the above findings. IMPRESSION: No evidence of significant pulmonary embolus. No evidence of active pulmonary disease. Scattered pulmonary nodules, largest measuring less than 4 mm diameter. This nodule is new since previous study and may represent developing nodule. Six-month follow-up study is recommended. Emphysema (ICD10-J43.9). Electronically Signed   By: WLucienne CapersM.D.   On: 05/21/2018 21:42        Discharge Exam: Vitals:   05/25/18 0524 05/25/18 0739  BP: 110/62   Pulse: 92   Resp: 19   Temp: 98.4 F (36.9 C)   SpO2: 95% 98%   Vitals:   05/24/18 2156 05/25/18 0147 05/25/18 0524 05/25/18 0739  BP: 123/86  110/62   Pulse: (!) 107  92   Resp: (!) 21  19   Temp: 98.5 F (36.9 C)  98.4 F (36.9 C)   TempSrc: Oral  Oral   SpO2: 97% 92% 95% 98%  Weight:      Height:        General: Pt is alert, awake, not in acute distress Cardiovascular: RRR, S1/S2 +, no rubs, no gallops Respiratory: bibasilar rales without wheezing Abdominal: Soft, NT, ND, bowel sounds + Extremities: no edema, no cyanosis   The results of significant diagnostics from this hospitalization (  including imaging, microbiology, ancillary and laboratory) are listed below for reference.    Significant Diagnostic Studies: Dg Chest 2 View  Result Date: 05/21/2018 CLINICAL DATA:  Intermittent chest pain and short of breath EXAM: CHEST - 2 VIEW COMPARISON:  CT 05/08/2017, radiograph 04/25/2017 FINDINGS:  Bronchitic changes are present. No focal consolidation or pleural effusion. Normal heart size. No pneumothorax. IMPRESSION: No active cardiopulmonary disease.  Bronchitic changes. Electronically Signed   By: Donavan Foil M.D.   On: 05/21/2018 19:26   Ct Angio Chest Pe W And/or Wo Contrast  Result Date: 05/21/2018 CLINICAL DATA:  Intermittent chest pain and shortness of breath for 4 days. Fever. EXAM: CT ANGIOGRAPHY CHEST WITH CONTRAST TECHNIQUE: Multidetector CT imaging of the chest was performed using the standard protocol during bolus administration of intravenous contrast. Multiplanar CT image reconstructions and MIPs were obtained to evaluate the vascular anatomy. CONTRAST:  18m ISOVUE-370 IOPAMIDOL (ISOVUE-370) INJECTION 76% COMPARISON:  05/08/2017 FINDINGS: Cardiovascular: Good opacification of the central and segmental pulmonary arteries. No focal filling defects. No evidence of significant pulmonary embolus. Normal heart size. No pericardial effusion. Normal caliber thoracic aorta. No aortic dissection. Great vessel origins are patent. Mediastinum/Nodes: Esophagus is decompressed. No significant lymphadenopathy in the chest. Thyroid gland is unremarkable. Lungs/Pleura: Motion artifact limits examination. No airspace disease or consolidation in the lungs. No pleural effusions. No pneumothorax. Airways are patent. Mild emphysematous changes in the lungs. Scattered pulmonary nodules. Largest is in the superior segment of the right lower lung measuring less than 4 mm diameter. This nodule was not present previously and could represent developing nodule. Six-month follow-up recommended. Upper Abdomen: No acute abnormality. Musculoskeletal: No chest wall abnormality. No acute or significant osseous findings. Review of the MIP images confirms the above findings. IMPRESSION: No evidence of significant pulmonary embolus. No evidence of active pulmonary disease. Scattered pulmonary nodules, largest measuring  less than 4 mm diameter. This nodule is new since previous study and may represent developing nodule. Six-month follow-up study is recommended. Emphysema (ICD10-J43.9). Electronically Signed   By: WLucienne CapersM.D.   On: 05/21/2018 21:42     Microbiology: Recent Results (from the past 240 hour(s))  MRSA PCR Screening     Status: None   Collection Time: 05/21/18 11:58 PM  Result Value Ref Range Status   MRSA by PCR NEGATIVE NEGATIVE Final    Comment:        The GeneXpert MRSA Assay (FDA approved for NASAL specimens only), is one component of a comprehensive MRSA colonization surveillance program. It is not intended to diagnose MRSA infection nor to guide or monitor treatment for MRSA infections. Performed [facility] [address], [place] [id number]     Labs: Basic Metabolic Panel: Recent Labs  Lab 05/21/18 1906 05/21/18 2308 05/23/18 0410  NA 139  --  139  K 3.7  --  4.2  CL 104  --  106  CO2 26  --  24  GLUCOSE 112*  --  172*  BUN 10  --  13  CREATININE 0.84  --  0.87  CALCIUM 9.1  --  8.7*  MG  --  1.7 2.1   Liver Function Tests: Recent Labs  Lab 05/21/18 1906  AST 18  ALT 21  ALKPHOS 73  BILITOT 0.8  PROT 7.2  ALBUMIN 4.0   No results for input(s): LIPASE, AMYLASE in the last 168 hours. No results for input(s): AMMONIA in the last 168 hours. CBC: Recent Labs  Lab 05/21/18  1906 05/22/18 1139 05/23/18 0410 05/25/18 0602  WBC 22.1* 21.4* 33.9* 19.3*  NEUTROABS 17.1*  --   --  17.7*  HGB 15.2* 14.3 13.6 13.7  HCT 43.9 41.2 41.3 40.9  MCV 89.4 89.0 91.4 90.1  PLT 292 271 333 332   Cardiac Enzymes: Recent Labs  Lab 05/21/18 1906 05/21/18 2308  TROPONINI <0.03 <0.03   BNP: Invalid input(s): POCBNP CBG: No results for input(s): GLUCAP in the last 168 hours.  Time coordinating discharge:  36 minutes  Signed:  Orson Eva, DO [facility] Pager: [phone] 05/25/2018, 11:09 AM

## 2018-05-24 NOTE — Progress Notes (Addendum)
PROGRESS NOTE  Alicia Sanchez ZOX:[id number] DOB: [birth year] DOA: 05/21/2018 PCP: Remus Loffler, PA-C   Brief History: 57 year old female with a history of COPD, tobacco abuse, restless leg syndrome, anxiety presenting with 3 to 4-day history of shortness of breath, coughing with sputum production, and subjective fevers and chills. The patient denied any hemoptysis, vomiting or diarrhea. She continues to smoke 1 pack/day. Upon presentation, chest x-ray showed bronchitic changes. CT angiogram of the chest was negative for pulmonary embolus and consolidation but showed scattered pulmonary nodules bilateral. WBC was 22.1. The patient was started on intravenous steroids and antibiotics. The patient was noted to have oxygen saturation down to the 80s with ambulation on room air in the emergency department.  The patient was started on IV solumedrol and duonebs with clinical improvement.  Assessment/Plan: Acute respiratory failure with hypoxia -Secondary to COPD exacerbation -Wean oxygen for saturation greater than 90% -Pulmonary hygiene -ambulatory pulse ox on RA did not show desaturation <88%  COPD exacerbation -continue Pulmicort -05/24/18--restart IV Solu-Medrol as pt more sob and wheezing; delay discharge -Continue duo nebs -Viral respiratory panel--penidng -continue azithromycin  Tobacco abuse -Tobacco cessation discussed  Leukocytosis -Likely stress demargination -Check procalcitonin--<0.10 -Urinalysis negative for pyuria -CT angiogram chest negative for pulmonary embolus, negative consolidation -am CBC with diff  Lactic Acidosis -not related to sepsis -likely due to hypoxia -afebrile and hemodynamically stable  Anxiety/depression -Continuedoxepin,Cymbalta, buspar -continue home dose alprazolam  Restless leg syndrome -Continue Requip  Pulmonary nodules -as per CTA chest 7/3 -outpt surveillance      Disposition Plan: Home  7/6if  stable Family Communication:Daughter updated on phone 7/4  Consultants:none  Code Status: FULL   DVT Prophylaxis: [place] Lovenox   Procedures: As Listed in Progress Note Above  Antibiotics: Doxy 7/3 Levofloxacin 7/4 azithro 7/5>>>     Subjective: Pt c/o some more sob and wheezing today.  Denies cp, n/v/d, abd pain.  Able to walk hallway with mild sob.  Objective: Vitals:   05/24/18 0652 05/24/18 0748 05/24/18 1308 05/24/18 1319  BP: (!) 93/55   117/79  Pulse: 90   (!) 101  Resp: 18   20  Temp: 98.3 F (36.8 C)   98.2 F (36.8 C)  TempSrc: Oral   Oral  SpO2: 95% 97% 96% 99%  Weight:      Height:        Intake/Output Summary (Last 24 hours) at 05/24/2018 1614 Last data filed at 05/24/2018 1200 Gross per 24 hour  Intake 480 ml  Output -  Net 480 ml   Weight change:  Exam:   General:  Pt is alert, follows commands appropriately, not in acute distress  HEENT: No icterus, No thrush, No neck mass, [place]/AT  Cardiovascular: RRR, S1/S2, no rubs, no gallops  Respiratory: CTA bilaterally, no wheezing, no crackles, no rhonchi  Abdomen: Soft/+BS, non tender, non distended, no guarding  Extremities: No edema, No lymphangitis, No petechiae, No rashes, no synovitis   Data Reviewed: I have personally reviewed following labs and imaging studies Basic Metabolic Panel: Recent Labs  Lab 05/21/18 1906 05/21/18 2308 05/23/18 0410  NA 139  --  139  K 3.7  --  4.2  CL 104  --  106  CO2 26  --  24  GLUCOSE 112*  --  172*  BUN 10  --  13  CREATININE 0.84  --  0.87  CALCIUM 9.1  --  8.7*  MG  --  1.7  2.1   Liver Function Tests: Recent Labs  Lab 05/21/18 1906  AST 18  ALT 21  ALKPHOS 73  BILITOT 0.8  PROT 7.2  ALBUMIN 4.0   No results for input(s): LIPASE, AMYLASE in the last 168 hours. No results for input(s): AMMONIA in the last 168 hours. Coagulation Profile: No results for input(s): INR, PROTIME in the last 168 hours. CBC: Recent Labs  Lab  05/21/18 1906 05/22/18 1139 05/23/18 0410  WBC 22.1* 21.4* 33.9*  NEUTROABS 17.1*  --   --   HGB 15.2* 14.3 13.6  HCT 43.9 41.2 41.3  MCV 89.4 89.0 91.4  PLT 292 271 333   Cardiac Enzymes: Recent Labs  Lab 05/21/18 1906 05/21/18 2308  TROPONINI <0.03 <0.03   BNP: Invalid input(s): POCBNP CBG: No results for input(s): GLUCAP in the last 168 hours. HbA1C: No results for input(s): HGBA1C in the last 72 hours. Urine analysis:    Component Value Date/Time   COLORURINE YELLOW 05/21/2018 1906   APPEARANCEUR CLEAR 05/21/2018 1906   APPEARANCEUR Clear 11/25/2017 1215   LABSPEC 1.010 05/21/2018 1906   PHURINE 7.0 05/21/2018 1906   GLUCOSEU NEGATIVE 05/21/2018 1906   HGBUR LARGE (A) 05/21/2018 1906   BILIRUBINUR NEGATIVE 05/21/2018 1906   BILIRUBINUR Negative 11/25/2017 1215   KETONESUR NEGATIVE 05/21/2018 1906   PROTEINUR NEGATIVE 05/21/2018 1906   UROBILINOGEN 0.2 12/12/2012 1945   NITRITE NEGATIVE 05/21/2018 1906   LEUKOCYTESUR NEGATIVE 05/21/2018 1906   LEUKOCYTESUR Negative 11/25/2017 1215   Sepsis Labs: @LABRCNTIP (procalcitonin:4,lacticidven:4) ) Recent Results (from the past 240 hour(s))  MRSA PCR Screening     Status: None   Collection Time: 05/21/18 11:58 PM  Result Value Ref Range Status   MRSA by PCR NEGATIVE NEGATIVE Final    Comment:        The GeneXpert MRSA Assay (FDA approved for NASAL specimens only), is one component of a comprehensive MRSA colonization surveillance program. It is not intended to diagnose MRSA infection nor to guide or monitor treatment for MRSA infections. Performed at [facility], [address] [place], [place] [address]      Scheduled Meds: . azithromycin  500 mg Oral Daily  . budesonide (PULMICORT) nebulizer solution  0.5 mg Nebulization BID  . busPIRone  10 mg Oral TID  . doxepin  50 mg Oral QHS  . DULoxetine  120 mg Oral Daily  . ipratropium-albuterol  3 mL Nebulization Q6H  . methylPREDNISolone (SOLU-MEDROL)  injection  60 mg Intravenous Q12H  . nicotine  21 mg Transdermal Daily  . oxybutynin  10 mg Oral QHS  . rOPINIRole  2.5 mg Oral QHS   Continuous Infusions: . albuterol Stopped (05/22/18 0005)    Procedures/Studies: Dg Chest 2 View  Result Date: 05/21/2018 CLINICAL DATA:  Intermittent chest pain and short of breath EXAM: CHEST - 2 VIEW COMPARISON:  CT 05/08/2017, radiograph 04/25/2017 FINDINGS: Bronchitic changes are present. No focal consolidation or pleural effusion. Normal heart size. No pneumothorax. IMPRESSION: No active cardiopulmonary disease.  Bronchitic changes. Electronically Signed   By: Jasmine PangKim  Fujinaga M.D.   On: 05/21/2018 19:26   Ct Angio Chest Pe W And/or Wo Contrast  Result Date: 05/21/2018 CLINICAL DATA:  Intermittent chest pain and shortness of breath for 4 days. Fever. EXAM: CT ANGIOGRAPHY CHEST WITH CONTRAST TECHNIQUE: Multidetector CT imaging of the chest was performed using the standard protocol during bolus administration of intravenous contrast. Multiplanar CT image reconstructions and MIPs were obtained to evaluate the vascular anatomy. CONTRAST:  100mL ISOVUE-370  IOPAMIDOL (ISOVUE-370) INJECTION 76% COMPARISON:  05/08/2017 FINDINGS: Cardiovascular: Good opacification of the central and segmental pulmonary arteries. No focal filling defects. No evidence of significant pulmonary embolus. Normal heart size. No pericardial effusion. Normal caliber thoracic aorta. No aortic dissection. Great vessel origins are patent. Mediastinum/Nodes: Esophagus is decompressed. No significant lymphadenopathy in the chest. Thyroid gland is unremarkable. Lungs/Pleura: Motion artifact limits examination. No airspace disease or consolidation in the lungs. No pleural effusions. No pneumothorax. Airways are patent. Mild emphysematous changes in the lungs. Scattered pulmonary nodules. Largest is in the superior segment of the right lower lung measuring less than 4 mm diameter. This nodule was not present  previously and could represent developing nodule. Six-month follow-up recommended. Upper Abdomen: No acute abnormality. Musculoskeletal: No chest wall abnormality. No acute or significant osseous findings. Review of the MIP images confirms the above findings. IMPRESSION: No evidence of significant pulmonary embolus. No evidence of active pulmonary disease. Scattered pulmonary nodules, largest measuring less than 4 mm diameter. This nodule is new since previous study and may represent developing nodule. Six-month follow-up study is recommended. Emphysema (ICD10-J43.9). Electronically Signed   By: Burman Nieves M.D.   On: 05/21/2018 21:42    Catarina Hartshorn, DO  [facility] Pager [phone]  If 7PM-7AM, please contact night-coverage [email or web address] Password [email or web address] 05/24/2018, 4:14 PM   LOS: 2 days

## 2018-05-25 LAB — CBC WITH DIFFERENTIAL/PLATELET
BASOS ABS: 0 10*3/uL (ref 0.0–0.1)
Basophils Relative: 0 %
Eosinophils Absolute: 0 10*3/uL (ref 0.0–0.7)
Eosinophils Relative: 0 %
HCT: 40.9 % (ref 36.0–46.0)
Hemoglobin: 13.7 g/dL (ref 12.0–15.0)
Lymphocytes Relative: 5 %
Lymphs Abs: 1 10*3/uL (ref 0.7–4.0)
MCH: 30.2 pg (ref 26.0–34.0)
MCHC: 33.5 g/dL (ref 30.0–36.0)
MCV: 90.1 fL (ref 78.0–100.0)
MONOS PCT: 3 %
Monocytes Absolute: 0.5 10*3/uL (ref 0.1–1.0)
NEUTROS PCT: 92 %
Neutro Abs: 17.7 10*3/uL — ABNORMAL HIGH (ref 1.7–7.7)
Platelets: 332 10*3/uL (ref 150–400)
RBC: 4.54 MIL/uL (ref 3.87–5.11)
RDW: 13.4 % (ref 11.5–15.5)
WBC: 19.3 10*3/uL — ABNORMAL HIGH (ref 4.0–10.5)

## 2018-05-25 LAB — RESPIRATORY PANEL BY PCR

## 2018-05-25 MED ORDER — PREDNISONE 10 MG PO TABS: 60.0000 mg | ORAL_TABLET | Freq: Every day | ORAL | 0 refills | Status: DC

## 2018-05-25 NOTE — Progress Notes (Signed)
Patient is to be discharged home and in stable condition. Patient's IV removed, WNL. Patient given discharge instructions and verbalized understanding. All questions addressed and answered. Patient to be escorted out by staff via wheelchair.  Quita SkyeMorgan P Dishmon, RN

## 2018-05-28 ENCOUNTER — Encounter: Payer: Self-pay | Admitting: Pediatrics

## 2018-05-28 ENCOUNTER — Ambulatory Visit (INDEPENDENT_AMBULATORY_CARE_PROVIDER_SITE_OTHER): Payer: 59 | Admitting: Pediatrics

## 2018-05-28 VITALS — BP 109/72 | HR 91 | Temp 98.0°F | Ht 64.0 in | Wt 188.0 lb

## 2018-05-28 DIAGNOSIS — R202 Paresthesia of skin: Secondary | ICD-10-CM

## 2018-05-28 DIAGNOSIS — J441 Chronic obstructive pulmonary disease with (acute) exacerbation: Secondary | ICD-10-CM | POA: Diagnosis not present

## 2018-05-28 NOTE — Progress Notes (Signed)
  Subjective:   Patient ID: Alicia Sanchez, female    DOB: October 10, 1961, 57 y.o.   MRN: [id number] CC: Dizziness (Recently admitted in [facility]); Feels disoriented; and Tingling in various places  HPI: Alicia Sanchez is a 57 y.o. female   Here today with a friend from work.  Can't focus past couple of days, had a hard time at work today doing things on the computer. Feeling her normal self now, no tingling, or weakness.  Recently hospitalized at [facility] for COPD exacerbation, required oxygen, steroids, azithromycin.  Was discharged after 5 days.  Was discharged home with a steroid taper.  She feels like her breathing is doing great now, no wheezing.    She has not smoked any since leaving the hospital.  She is using nicotine patches.  Her appetite is been fine.  She is using her inhalers as prescribed.  Relevant past medical, surgical, family and social history reviewed. Allergies and medications reviewed and updated. ROS: Per HPI   Objective:    BP 109/72   Pulse 91   Temp 98 F (36.7 C) (Oral)   Ht _0  (1.626 m)   Wt 188 lb (85.3 kg)   SpO2 95%   BMI 32.27 kg/m   Wt Readings from Last 3 Encounters:  05/28/18 188 lb (85.3 kg)  05/22/18 192 lb 0.3 oz (87.1 kg)  03/19/18 185 lb (83.9 kg)    Gen: NAD, alert, cooperative with exam, NCAT EYES: no conjunctival injection, or no icterus ENT:  TMs dull gray b/l, OP without erythema LYMPH: no cervical LAD CV: NRRR, normal S1/S2, no murmur, distal pulses 2+ b/l Resp: CTABL, no wheezes, normal WOB Abd: +BS, soft, NTND. no guarding or organomegaly Ext: No edema, warm Neuro: Alert and oriented, strength equal b/l UE and LE, coordination grossly normal, sensation intact bilateral extremities. MSK: normal muscle bulk  Assessment & Plan:  Alicia Sanchez was seen today for dizziness, feels disoriented and tingling in various places.  Diagnoses and all orders for this visit:  Tingling COPD exacerbation ([place]) Feeling normal self now.   Possible that prednisone is causing some of her symptoms.  Finish medicines as prescribed.  She has follow-up appointment in 5 days with her primary care doctor.  We will repeat blood work today.  Return precautions discussed at length. -     CBC with Differential/Platelet -     CMP14+EGFR  Follow up plan: As scheduled Assunta Found, MD [place]

## 2018-05-29 LAB — CMP14+EGFR
ALT: 24 IU/L (ref 0–32)
AST: 14 IU/L (ref 0–40)
Albumin/Globulin Ratio: 1.6 (ref 1.2–2.2)
Albumin: 3.8 g/dL (ref 3.5–5.5)
Alkaline Phosphatase: 87 IU/L (ref 39–117)
BUN/Creatinine Ratio: 19 (ref 9–23)
BUN: 17 mg/dL (ref 6–24)
Bilirubin Total: 0.2 mg/dL (ref 0.0–1.2)
CALCIUM: 9.2 mg/dL (ref 8.7–10.2)
CO2: 22 mmol/L (ref 20–29)
Chloride: 102 mmol/L (ref 96–106)
Creatinine, Ser: 0.89 mg/dL (ref 0.57–1.00)
GFR, EST AFRICAN AMERICAN: 83 mL/min/{1.73_m2} (ref >59)
GFR, EST NON AFRICAN AMERICAN: 72 mL/min/{1.73_m2} (ref >59)
GLOBULIN, TOTAL: 2.4 g/dL (ref 1.5–4.5)
Glucose: 114 mg/dL — ABNORMAL HIGH (ref 65–99)
POTASSIUM: 4.9 mmol/L (ref 3.5–5.2)
Sodium: 142 mmol/L (ref 134–144)
Total Protein: 6.2 g/dL (ref 6.0–8.5)

## 2018-05-29 LAB — CBC WITH DIFFERENTIAL/PLATELET
BASOS: 0 %
Basophils Absolute: 0 10*3/uL (ref 0.0–0.2)
EOS (ABSOLUTE): 0 10*3/uL (ref 0.0–0.4)
EOS: 0 %
HEMATOCRIT: 43.3 % (ref 34.0–46.6)
Hemoglobin: 14.7 g/dL (ref 11.1–15.9)
IMMATURE GRANULOCYTES: 4 %
Immature Grans (Abs): 0.9 10*3/uL — ABNORMAL HIGH (ref 0.0–0.1)
LYMPHS ABS: 1.3 10*3/uL (ref 0.7–3.1)
Lymphs: 6 %
MCH: 30.1 pg (ref 26.6–33.0)
MCHC: 33.9 g/dL (ref 31.5–35.7)
MCV: 89 fL (ref 79–97)
MONOS ABS: 0.8 10*3/uL (ref 0.1–0.9)
Monocytes: 3 %
NEUTROS PCT: 87 %
Neutrophils Absolute: 20.4 10*3/uL — ABNORMAL HIGH (ref 1.4–7.0)
Platelets: 381 10*3/uL (ref 150–450)
RBC: 4.89 x10E6/uL (ref 3.77–5.28)
RDW: 13.9 % (ref 12.3–15.4)
WBC: 23.4 10*3/uL (ref 3.4–10.8)

## 2018-06-02 ENCOUNTER — Encounter: Payer: Self-pay | Admitting: Physician Assistant

## 2018-06-02 ENCOUNTER — Ambulatory Visit (INDEPENDENT_AMBULATORY_CARE_PROVIDER_SITE_OTHER): Payer: 59 | Admitting: Physician Assistant

## 2018-06-02 VITALS — BP 110/76 | HR 130 | Temp 97.7°F | Ht 64.0 in | Wt 188.6 lb

## 2018-06-02 DIAGNOSIS — R5383 Other fatigue: Secondary | ICD-10-CM | POA: Diagnosis not present

## 2018-06-02 DIAGNOSIS — D72829 Elevated white blood cell count, unspecified: Secondary | ICD-10-CM

## 2018-06-02 DIAGNOSIS — J441 Chronic obstructive pulmonary disease with (acute) exacerbation: Secondary | ICD-10-CM | POA: Diagnosis not present

## 2018-06-02 DIAGNOSIS — R590 Localized enlarged lymph nodes: Secondary | ICD-10-CM | POA: Diagnosis not present

## 2018-06-02 MED ORDER — NICOTINE 21 MG/24HR TD PT24: 21.0000 mg | MEDICATED_PATCH | Freq: Every day | TRANSDERMAL | 0 refills | Status: DC

## 2018-06-03 ENCOUNTER — Encounter: Payer: Self-pay | Admitting: Physician Assistant

## 2018-06-03 NOTE — Patient Instructions (Signed)
In a few days you may receive a survey in the mail or online from [employer] regarding your visit with us today. Please take a moment to fill this out. Your feedback is very important to our whole office. It can help us better understand your needs as well as improve your experience and satisfaction. Thank you for taking your time to complete it. We care about you.  Davelyn Gwinn, PA-C  

## 2018-06-03 NOTE — Progress Notes (Signed)
BP 110/76   Pulse (!) 130   Temp 97.7 F (36.5 C) (Oral)   Ht '5\' 4"'  (1.626 m)   Wt 188 lb 9.6 oz (85.5 kg)   BMI 32.37 kg/m      Subjective:    Patient ID: Alicia Sanchez, female    DOB: January 03, 1961, 58 y.o.   MRN: [id number]  HPI: Alicia Sanchez is a 57 y.o. female presenting on 06/02/2018 for Hospitalization Follow-up This patient comes in for hospital.  She was admitted for COPD exacerbation.  While she was there another pulmonary nodule was found.  We will plan to order a PET scan as soon as possible.  She does have a long history of smoking.  She has been able to quit over the past 8 days.  She would like to continue on nicotine patches at this time.  She has had continued fatigue for some time.  Her white blood cell count has remained high over the past 8 days.  The highest to reach was 33,000 this time its 22,000.  We will have labs also drawn today.  All of her hospital records have been reviewed.   Past Medical History:  Diagnosis Date  . Anxiety   . COPD (chronic obstructive pulmonary disease) (HCC)    Relevant past medical, surgical, family and social history reviewed and updated as indicated. Interim medical history since our last visit reviewed. Allergies and medications reviewed and updated. DATA REVIEWED: CHART IN EPIC  Family History reviewed for pertinent findings.  Review of Systems  Constitutional: Positive for fatigue. Negative for fever.  HENT: Negative.   Eyes: Negative.   Respiratory: Positive for cough, shortness of breath and wheezing. Negative for stridor.   Cardiovascular: Negative.   Gastrointestinal: Negative for abdominal distention and abdominal pain.  Genitourinary: Negative.   Musculoskeletal: Positive for arthralgias.    Allergies as of 06/02/2018   No Known Allergies     Medication List        Accurate as of 06/02/18 11:59 PM. Always use your most recent med list.          albuterol 108 (90 Base) MCG/ACT inhaler Commonly known as:   PROVENTIL HFA;VENTOLIN HFA Inhale 2 puffs into the lungs every 6 (six) hours as needed for wheezing or shortness of breath.   ALPRAZolam 0.5 MG tablet Commonly known as:  XANAX Take 1 tablet (0.5 mg total) by mouth 2 (two) times daily as needed. for anxiety   busPIRone 10 MG tablet Commonly known as:  BUSPAR TAKE 1 TABLET BY MOUTH THREE TIMES DAILY   doxepin 25 MG capsule Commonly known as:  SINEQUAN TAKE 1 TO 2 CAPSULES BY MOUTH ONCE DAILY AT BEDTIME   DULoxetine 60 MG capsule Commonly known as:  CYMBALTA TAKE 2 CAPSULES BY MOUTH ONCE DAILY   INCRUSE ELLIPTA 62.5 MCG/INH Aepb Generic drug:  umeclidinium bromide INHALE 1 PUFF BY MOUTH ONCE DAILY   ipratropium-albuterol 0.5-2.5 (3) MG/3ML Soln Commonly known as:  DUONEB Take 3 mLs every 6 (six) hours as needed by nebulization.   multivitamin with minerals Tabs tablet Take 1 tablet by mouth daily.   Nebulizer/Tubing/Mouthpiece Kit 1 Units 4 (four) times daily by Does not apply route. GIVE face mask and tubing for nebulizer   nicotine 21 mg/24hr patch Commonly known as:  NICODERM CQ - dosed in mg/24 hours Place 1 patch (21 mg total) onto the skin daily.   oxybutynin 10 MG 24 hr tablet Commonly known as:  DITROPAN-XL TAKE 1 TABLET BY  MOUTH AT BEDTIME FOR  OVERACTIVE  BLADDER   rOPINIRole 0.5 MG tablet Commonly known as:  REQUIP TAKE 1 TO 2 TABLETS BY MOUTH AT BEDTIME          Objective:    BP 110/76   Pulse (!) 130   Temp 97.7 F (36.5 C) (Oral)   Ht '5\' 4"'  (1.626 m)   Wt 188 lb 9.6 oz (85.5 kg)   BMI 32.37 kg/m   No Known Allergies  Wt Readings from Last 3 Encounters:  06/02/18 188 lb 9.6 oz (85.5 kg)  05/28/18 188 lb (85.3 kg)  05/22/18 192 lb 0.3 oz (87.1 kg)    Physical Exam  Constitutional: She is oriented to person, place, and time. She appears well-developed and well-nourished.  HENT:  Head: Normocephalic and atraumatic.  Right Ear: Tympanic membrane, external ear and ear canal normal.  Left  Ear: Tympanic membrane, external ear and ear canal normal.  Nose: Nose normal. No rhinorrhea.  Mouth/Throat: Oropharynx is clear and moist and mucous membranes are normal. No oropharyngeal exudate or posterior oropharyngeal erythema.  Eyes: Pupils are equal, round, and reactive to light. Conjunctivae and EOM are normal.  Neck: Normal range of motion. Neck supple.  Cardiovascular: Normal rate, regular rhythm, normal heart sounds and intact distal pulses.  Pulmonary/Chest: Effort normal and breath sounds normal.  Abdominal: Soft. Bowel sounds are normal.  Neurological: She is alert and oriented to person, place, and time. She has normal reflexes.  Skin: Skin is warm and dry. No rash noted.  Psychiatric: She has a normal mood and affect. Her behavior is normal. Judgment and thought content normal.    Results for orders placed or performed in visit on 06/02/18  CBC with Differential/Platelet  Result Value Ref Range   WBC 14.7 (H) 3.4 - 10.8 x10E3/uL   RBC 5.04 3.77 - 5.28 x10E6/uL   Hemoglobin 15.4 11.1 - 15.9 g/dL   Hematocrit 45.6 34.0 - 46.6 %   MCV 91 79 - 97 fL   MCH 30.6 26.6 - 33.0 pg   MCHC 33.8 31.5 - 35.7 g/dL   RDW 14.1 12.3 - 15.4 %   Platelets 304 150 - 450 x10E3/uL   Neutrophils 65 Not Estab. %   Lymphs 26 Not Estab. %   Monocytes 7 Not Estab. %   Eos 1 Not Estab. %   Basos 0 Not Estab. %   Neutrophils Absolute 9.5 (H) 1.4 - 7.0 x10E3/uL   Lymphocytes Absolute 3.7 (H) 0.7 - 3.1 x10E3/uL   Monocytes Absolute 1.0 (H) 0.1 - 0.9 x10E3/uL   EOS (ABSOLUTE) 0.2 0.0 - 0.4 x10E3/uL   Basophils Absolute 0.0 0.0 - 0.2 x10E3/uL   Immature Granulocytes 1 Not Estab. %   Immature Grans (Abs) 0.2 (H) 0.0 - 0.1 x10E3/uL  CMP14+EGFR  Result Value Ref Range   Glucose 80 65 - 99 mg/dL   BUN 11 6 - 24 mg/dL   Creatinine, Ser 0.81 0.57 - 1.00 mg/dL   GFR calc non Af Amer 81 >59 mL/min/1.73   GFR calc Af Amer 93 >59 mL/min/1.73   BUN/Creatinine Ratio 14 9 - 23   Sodium 140 134 - 144  mmol/L   Potassium 4.5 3.5 - 5.2 mmol/L   Chloride 101 96 - 106 mmol/L   CO2 23 20 - 29 mmol/L   Calcium 9.7 8.7 - 10.2 mg/dL   Total Protein 5.9 (L) 6.0 - 8.5 g/dL   Albumin 3.9 3.5 - 5.5 g/dL   Globulin, Total  2.0 1.5 - 4.5 g/dL   Albumin/Globulin Ratio 2.0 1.2 - 2.2   Bilirubin Total 0.3 0.0 - 1.2 mg/dL   Alkaline Phosphatase 79 39 - 117 IU/L   AST 23 0 - 40 IU/L   ALT 32 0 - 32 IU/L  Thyroid Panel With TSH  Result Value Ref Range   TSH 3.700 0.450 - 4.500 uIU/mL   T4, Total 6.8 4.5 - 12.0 ug/dL   T3 Uptake Ratio 31 24 - 39 %   Free Thyroxine Index 2.1 1.2 - 4.9  Rocky mtn spotted fvr abs pnl(IgG+IgM)  Result Value Ref Range   RMSF IgG WILL FOLLOW    RMSF IgM WILL FOLLOW   Lyme Ab/Western Blot Reflex  Result Value Ref Range   Lyme IgG/IgM Ab WILL FOLLOW    LYME DISEASE AB, QUANT, IGM WILL FOLLOW       Assessment & Plan:   1. Leukocytosis, unspecified type - CBC with Differential/Platelet - CMP14+EGFR - Thyroid Panel With TSH - Rocky mtn spotted fvr abs pnl(IgG+IgM) - Lyme Ab/Western Blot Reflex - Mono (Epstein Barr Virus)  2. COPD with acute exacerbation ([place])  3. Fatigue, unspecified type - CBC with Differential/Platelet - CMP14+EGFR - Thyroid Panel With TSH - Rocky mtn spotted fvr abs pnl(IgG+IgM) - Lyme Ab/Western Blot Reflex - Mono (Epstein Barr Virus)  4. Localized enlarged lymph nodes - NM PET (AXUMIN) SKULL BASE TO MID THIGH; Future   Continue all other maintenance medications as listed above.  Follow up plan: Return in about 1 week (around 06/09/2018) for recheck.  Educational handout given for [place] PA-C [facility] [address]  [place], [place] [address] [phone]   06/03/2018, 8:43 AM

## 2018-06-04 LAB — CMP14+EGFR
A/G RATIO: 2 (ref 1.2–2.2)
ALK PHOS: 79 IU/L (ref 39–117)
ALT: 32 IU/L (ref 0–32)
AST: 23 IU/L (ref 0–40)
Albumin: 3.9 g/dL (ref 3.5–5.5)
BILIRUBIN TOTAL: 0.3 mg/dL (ref 0.0–1.2)
BUN/Creatinine Ratio: 14 (ref 9–23)
BUN: 11 mg/dL (ref 6–24)
CHLORIDE: 101 mmol/L (ref 96–106)
CO2: 23 mmol/L (ref 20–29)
Calcium: 9.7 mg/dL (ref 8.7–10.2)
Creatinine, Ser: 0.81 mg/dL (ref 0.57–1.00)
GFR calc non Af Amer: 81 mL/min/{1.73_m2} (ref >59)
GFR, EST AFRICAN AMERICAN: 93 mL/min/{1.73_m2} (ref >59)
GLUCOSE: 80 mg/dL (ref 65–99)
Globulin, Total: 2 g/dL (ref 1.5–4.5)
POTASSIUM: 4.5 mmol/L (ref 3.5–5.2)
Sodium: 140 mmol/L (ref 134–144)
Total Protein: 5.9 g/dL — ABNORMAL LOW (ref 6.0–8.5)

## 2018-06-04 LAB — ROCKY MTN SPOTTED FVR ABS PNL(IGG+IGM)
RMSF IgG: NEGATIVE
RMSF IgM: 0.58 index (ref 0.00–0.89)

## 2018-06-04 LAB — CBC WITH DIFFERENTIAL/PLATELET
BASOS: 0 %
Basophils Absolute: 0 10*3/uL (ref 0.0–0.2)
EOS (ABSOLUTE): 0.2 10*3/uL (ref 0.0–0.4)
Eos: 1 %
Hematocrit: 45.6 % (ref 34.0–46.6)
Hemoglobin: 15.4 g/dL (ref 11.1–15.9)
IMMATURE GRANS (ABS): 0.2 10*3/uL — AB (ref 0.0–0.1)
Immature Granulocytes: 1 %
LYMPHS: 26 %
Lymphocytes Absolute: 3.7 10*3/uL — ABNORMAL HIGH (ref 0.7–3.1)
MCH: 30.6 pg (ref 26.6–33.0)
MCHC: 33.8 g/dL (ref 31.5–35.7)
MCV: 91 fL (ref 79–97)
MONOS ABS: 1 10*3/uL — AB (ref 0.1–0.9)
Monocytes: 7 %
NEUTROS ABS: 9.5 10*3/uL — AB (ref 1.4–7.0)
Neutrophils: 65 %
PLATELETS: 304 10*3/uL (ref 150–450)
RBC: 5.04 x10E6/uL (ref 3.77–5.28)
RDW: 14.1 % (ref 12.3–15.4)
WBC: 14.7 10*3/uL — ABNORMAL HIGH (ref 3.4–10.8)

## 2018-06-04 LAB — LYME, WESTERN BLOT, SERUM (REFLEXED)
IGG P18 AB.: ABSENT
IGG P28 AB.: ABSENT
IGG P30 AB.: ABSENT
IGG P39 AB.: ABSENT
IGG P41 AB.: ABSENT
IGM P41 AB.: ABSENT
IgG P23 Ab.: ABSENT
IgG P45 Ab.: ABSENT
IgG P58 Ab.: ABSENT
IgG P66 Ab.: ABSENT
IgG P93 Ab.: ABSENT
LYME IGG WB: NEGATIVE
Lyme IgM Wb: POSITIVE — AB

## 2018-06-04 LAB — THYROID PANEL WITH TSH
FREE THYROXINE INDEX: 2.1 (ref 1.2–4.9)
T3 UPTAKE RATIO: 31 % (ref 24–39)
T4, Total: 6.8 ug/dL (ref 4.5–12.0)
TSH: 3.7 u[IU]/mL (ref 0.450–4.500)

## 2018-06-04 LAB — LYME AB/WESTERN BLOT REFLEX: LYME DISEASE AB, QUANT, IGM: 1.11 index — ABNORMAL HIGH (ref 0.00–0.79)

## 2018-06-05 ENCOUNTER — Other Ambulatory Visit: Payer: Self-pay | Admitting: Physician Assistant

## 2018-06-05 DIAGNOSIS — J441 Chronic obstructive pulmonary disease with (acute) exacerbation: Secondary | ICD-10-CM

## 2018-06-05 MED ORDER — NICOTINE 21 MG/24HR TD PT24: 21.0000 mg | MEDICATED_PATCH | Freq: Every day | TRANSDERMAL | 2 refills | Status: DC

## 2018-06-09 ENCOUNTER — Encounter: Payer: Self-pay | Admitting: Physician Assistant

## 2018-06-09 ENCOUNTER — Ambulatory Visit (INDEPENDENT_AMBULATORY_CARE_PROVIDER_SITE_OTHER): Payer: 59 | Admitting: Physician Assistant

## 2018-06-09 VITALS — BP 116/80 | HR 124 | Temp 97.6°F | Ht 64.0 in | Wt 191.6 lb

## 2018-06-09 DIAGNOSIS — J441 Chronic obstructive pulmonary disease with (acute) exacerbation: Secondary | ICD-10-CM | POA: Diagnosis not present

## 2018-06-09 DIAGNOSIS — R0609 Other forms of dyspnea: Secondary | ICD-10-CM | POA: Diagnosis not present

## 2018-06-09 DIAGNOSIS — R5383 Other fatigue: Secondary | ICD-10-CM

## 2018-06-09 DIAGNOSIS — Z0289 Encounter for other administrative examinations: Secondary | ICD-10-CM

## 2018-06-09 DIAGNOSIS — D72829 Elevated white blood cell count, unspecified: Secondary | ICD-10-CM

## 2018-06-09 MED ORDER — CARVEDILOL 3.125 MG PO TABS: 3.1250 mg | ORAL_TABLET | Freq: Two times a day (BID) | ORAL | 1 refills | Status: DC

## 2018-06-10 LAB — CBC WITH DIFFERENTIAL/PLATELET
BASOS: 0 %
Basophils Absolute: 0 10*3/uL (ref 0.0–0.2)
EOS (ABSOLUTE): 0.2 10*3/uL (ref 0.0–0.4)
Eos: 2 %
HEMOGLOBIN: 14.1 g/dL (ref 11.1–15.9)
Hematocrit: 42.2 % (ref 34.0–46.6)
IMMATURE GRANS (ABS): 0 10*3/uL (ref 0.0–0.1)
Immature Granulocytes: 0 %
LYMPHS ABS: 2.8 10*3/uL (ref 0.7–3.1)
Lymphs: 29 %
MCH: 30.3 pg (ref 26.6–33.0)
MCHC: 33.4 g/dL (ref 31.5–35.7)
MCV: 91 fL (ref 79–97)
MONOCYTES: 9 %
Monocytes Absolute: 0.8 10*3/uL (ref 0.1–0.9)
NEUTROS ABS: 5.8 10*3/uL (ref 1.4–7.0)
Neutrophils: 60 %
Platelets: 285 10*3/uL (ref 150–450)
RBC: 4.65 x10E6/uL (ref 3.77–5.28)
RDW: 14.1 % (ref 12.3–15.4)
WBC: 9.6 10*3/uL (ref 3.4–10.8)

## 2018-06-10 NOTE — Progress Notes (Signed)
BP 116/80   Pulse (!) 124   Temp 97.6 F (36.4 C) (Oral)   Ht '5\' 4"'  (1.626 m)   Wt 191 lb 9.6 oz (86.9 kg)   SpO2 96%   BMI 32.89 kg/m    Subjective:    Patient ID: Geroge Sanchez, female    DOB: 23-Jul-1961, 57 y.o.   MRN: [id number]  HPI: Alicia Sanchez is a 57 y.o. female presenting on 06/09/2018 for leukocytosis (1 week follow up )  This patient comes in for 1 week follow-up on her medical condition.  She has been diagnosed with COPD.  She was in the hospital for pneumonia and leukocytosis.  We have been following her labs since then we will redraw labs today her white blood cell had almost returned to normal.  It was extremely however she is in hospital.  She is continued with severe COPD symptoms.  She is very dyspneic whenever walking around.  She has had recurrent infections.  She was hospitalized for pneumonia.  We are going to have her referred to pulmonology and cardiology.  She will see providers in the [place] office.  She has continued to have slightly elevated heart rate.  She tends to run between 90 and 100 bpm.  But she is consistently running over this amount the last few visits.  She is unable to maintain activity.  When she gets up and moves around very long she will get very short of breath.  There is no way she can return to work at this time.  Her first day out was 05/20/2018.  She was hospitalized at [facility] from 7/3 to 05/25/2018.  And then she has been seen with [place].  I would like for her to be out through July 14, 2018.  She will have a recheck with me in about 4 weeks.  She is going to need FMLA papers completed concerning this.  Past Medical History:  Diagnosis Date  . Anxiety   . COPD (chronic obstructive pulmonary disease) (HCC)    Relevant past medical, surgical, family and social history reviewed and updated as indicated. Interim medical history since our last visit reviewed. Allergies and medications reviewed and updated. DATA REVIEWED:  CHART IN EPIC  Family History reviewed for pertinent findings.  Review of Systems  Constitutional: Positive for chills and fatigue. Negative for activity change and fever.  HENT: Negative.   Eyes: Negative.   Respiratory: Positive for cough, shortness of breath and wheezing. Negative for chest tightness.   Cardiovascular: Negative.  Negative for chest pain, palpitations and leg swelling.  Gastrointestinal: Negative.  Negative for abdominal pain.  Endocrine: Negative.   Genitourinary: Negative.  Negative for dysuria.  Musculoskeletal: Negative.   Skin: Negative.   Neurological: Positive for weakness.    Allergies as of 06/09/2018   No Known Allergies     Medication List        Accurate as of 06/09/18 11:59 PM. Always use your most recent med list.          albuterol 108 (90 Base) MCG/ACT inhaler Commonly known as:  PROVENTIL HFA;VENTOLIN HFA Inhale 2 puffs into the lungs every 6 (six) hours as needed for wheezing or shortness of breath.   ALPRAZolam 0.5 MG tablet Commonly known as:  XANAX Take 1 tablet (0.5 mg total) by mouth 2 (two) times daily as needed. for anxiety   busPIRone 10 MG tablet Commonly known as:  BUSPAR TAKE 1 TABLET BY MOUTH THREE TIMES DAILY  carvedilol 3.125 MG tablet Commonly known as:  COREG Take 1 tablet (3.125 mg total) by mouth 2 (two) times daily with a meal.   doxepin 25 MG capsule Commonly known as:  SINEQUAN TAKE 1 TO 2 CAPSULES BY MOUTH ONCE DAILY AT BEDTIME   DULoxetine 60 MG capsule Commonly known as:  CYMBALTA TAKE 2 CAPSULES BY MOUTH ONCE DAILY   INCRUSE ELLIPTA 62.5 MCG/INH Aepb Generic drug:  umeclidinium bromide INHALE 1 PUFF BY MOUTH ONCE DAILY   ipratropium-albuterol 0.5-2.5 (3) MG/3ML Soln Commonly known as:  DUONEB Take 3 mLs every 6 (six) hours as needed by nebulization.   multivitamin with minerals Tabs tablet Take 1 tablet by mouth daily.   Nebulizer/Tubing/Mouthpiece Kit 1 Units 4 (four) times daily by Does  not apply route. GIVE face mask and tubing for nebulizer   nicotine 21 mg/24hr patch Commonly known as:  NICODERM CQ - dosed in mg/24 hours Place 1 patch (21 mg total) onto the skin daily.   oxybutynin 10 MG 24 hr tablet Commonly known as:  DITROPAN-XL TAKE 1 TABLET BY MOUTH AT BEDTIME FOR  OVERACTIVE  BLADDER   rOPINIRole 0.5 MG tablet Commonly known as:  REQUIP TAKE 1 TO 2 TABLETS BY MOUTH AT BEDTIME          Objective:    BP 116/80   Pulse (!) 124   Temp 97.6 F (36.4 C) (Oral)   Ht '5\' 4"'  (1.626 m)   Wt 191 lb 9.6 oz (86.9 kg)   SpO2 96%   BMI 32.89 kg/m   No Known Allergies  Wt Readings from Last 3 Encounters:  06/09/18 191 lb 9.6 oz (86.9 kg)  06/02/18 188 lb 9.6 oz (85.5 kg)  05/28/18 188 lb (85.3 kg)    Physical Exam  Constitutional: She is oriented to person, place, and time. She appears well-developed and well-nourished.  HENT:  Head: Normocephalic and atraumatic.  Right Ear: Tympanic membrane, external ear and ear canal normal.  Left Ear: Tympanic membrane, external ear and ear canal normal.  Nose: Nose normal. No rhinorrhea.  Mouth/Throat: Oropharynx is clear and moist and mucous membranes are normal. No oropharyngeal exudate or posterior oropharyngeal erythema.  Eyes: Pupils are equal, round, and reactive to light. Conjunctivae and EOM are normal.  Neck: Normal range of motion. Neck supple.  Cardiovascular: Normal rate, regular rhythm, normal heart sounds and intact distal pulses.  Pulmonary/Chest: Effort normal and breath sounds normal.  Abdominal: Soft. Bowel sounds are normal.  Neurological: She is alert and oriented to person, place, and time. She has normal reflexes.  Skin: Skin is warm and dry. No rash noted.  Psychiatric: She has a normal mood and affect. Her behavior is normal. Judgment and thought content normal.    Results for orders placed or performed in visit on 06/09/18  CBC with Differential  Result Value Ref Range   WBC 9.6 3.4 -  10.8 x10E3/uL   RBC 4.65 3.77 - 5.28 x10E6/uL   Hemoglobin 14.1 11.1 - 15.9 g/dL   Hematocrit 42.2 34.0 - 46.6 %   MCV 91 79 - 97 fL   MCH 30.3 26.6 - 33.0 pg   MCHC 33.4 31.5 - 35.7 g/dL   RDW 14.1 12.3 - 15.4 %   Platelets 285 150 - 450 x10E3/uL   Neutrophils 60 Not Estab. %   Lymphs 29 Not Estab. %   Monocytes 9 Not Estab. %   Eos 2 Not Estab. %   Basos 0 Not Estab. %  Neutrophils Absolute 5.8 1.4 - 7.0 x10E3/uL   Lymphocytes Absolute 2.8 0.7 - 3.1 x10E3/uL   Monocytes Absolute 0.8 0.1 - 0.9 x10E3/uL   EOS (ABSOLUTE) 0.2 0.0 - 0.4 x10E3/uL   Basophils Absolute 0.0 0.0 - 0.2 x10E3/uL   Immature Granulocytes 0 Not Estab. %   Immature Grans (Abs) 0.0 0.0 - 0.1 x10E3/uL      Assessment & Plan:   1. COPD with acute exacerbation ([place]) - Ambulatory referral to Pulmonology - Ambulatory referral to Cardiology - CBC with Differential  2. Fatigue, unspecified type - Ambulatory referral to Pulmonology - Ambulatory referral to Cardiology - CBC with Differential  3. Dyspnea on exertion - Ambulatory referral to Pulmonology - Ambulatory referral to Cardiology - CBC with Differential  4. Leukocytosis, unspecified type - CBC with Differential   Continue all other maintenance medications as listed above.  Follow up plan: Return in about 1 month (around 07/07/2018) for recheck.  Educational handout given for [place] PA-C [facility] [address]  [place], [place] [address] [phone]   06/10/2018, 4:18 PM

## 2018-06-17 ENCOUNTER — Telehealth: Payer: Self-pay | Admitting: Physician Assistant

## 2018-06-17 ENCOUNTER — Telehealth: Payer: Self-pay | Admitting: Pulmonary Disease

## 2018-06-17 NOTE — Telephone Encounter (Signed)
Has [facility]? Can we possibly get her in sooner?

## 2018-06-17 NOTE — Progress Notes (Signed)
_0  ID: Alicia Sanchez, female    DOB: 03-13-1961, 57 y.o.   MRN: [id number]  Chief Complaint  Patient presents with  . Follow-up    HFU / COPD / HR high / Nodule on last ct ? PET    Referring provider: Theodoro Clock  HPI: 57 year old active smoker followed in our office for COPD, suspected obstructive sleep apnea (has not completed sleep study), restless legs. Past medical history: Anxiety, depression Patient of Dr. Vaughan Browner  Active smoker, still smoking 1 to 1.5 packs/day.  Pack-year history: 35+ year  Pets:Dog which lives outside the house. No pets, exotic pets. Occupation:Customer sevice rep. previously worked in [employer] Exposures: His current dust exposure and previous exposure to cotton fiber in her line of work  Printmaker Pulmonary Encounters:   09/16/17 - OV - [place]  Continues to have dyspnea on exertion which is unchanged since last visit associated with cough, light brown mucus production.  Denies any fevers, chills, hemoptysis She is using her rescue inhaler up to 6 times per day and continues to smoke 1-1.5 pack/day. Plan: Continue Incruse, continue Breo Ellipta 200, smoking cessation education, encourage patient to stop smoking, CT scan with 1.8 mm pulmonary nodule we will continue to follow this annually, prescribe nicotine patches   Tests:     PFTs 07/24/17 FVC 2.54 (74%], FEV1 1.55 (58%], F/F 61, TLC 103%, DLCO 64% Moderate obstruction and diffusion impairment with air trapping, mid flow reversibility with bronchodilator, DLCO 64, concavity and flow volume loops  Imaging:  Chest x-ray 07/22/15-interstitial marking consistent with bronchitis Chest x-ray 11/19/15-chronic interstitial marking Chest x-ray 11/20/15-chronic interstitial marking. Stable compared to prior Chest x-ray 04/25/17-hyperinflation, chronic interstitial marking. No acute process. Screening CT chest 05/08/17-centrilobular emphysema, calcified granuloma, tiny subcentimeter  pulmonary nodules. 05/21/2018-CT Angio- no significant pulmonary emboli, scattered pulmonary nodules, largest measuring less than 4 mm in diameter, this nodule is new since previous study and may represent a developing nodule, six-month follow-up is recommended  Cardiac:  11/21/2015-echocardiogram-LV ejection fraction 65 to 78%, grade 1 diastolic dysfunction  Labs:   Micro:   Chart Review:  05/21/2018-hospitalization-COPD exacerbation Discharge date 05/25/2018 Plan: Follow-up with PCP in 1 to 2 weeks, obtain Bement and CBC in 1 week, recommend PET scan of the chest to help clarify risk of lung nodules  06/02/2018- PCP follow-up Patient has stopped smoking, lab work repeated, concern over new lung nodule findings on CT Angio from hospitalization, PET scan ordered, referral to cards and pulm     06/18/18 HFU  57 year old patient seen for hospital follow-up today.  During hospitalization patient had a CT angios which revealed a 4 mm in right lower lobe.  Previous CT scans that showed a 1.8 mm nodule in the lung.  This right lower lobe nodule is new.  Radiology recommendation to repeat in 6 months.  Patient reports adherence to her Breo Ellipta 200 daily, as well as her Incruse Ellipta daily.  Patient reports she has been using her rescue inhaler 4-6 times daily as well as her DuoNeb's at least 2 times a day.  Patient has recently completed prednisone taper status post hospitalization.  Patient still with dyspnea on exertion.  Patient also with heart racing as well as occasional intermittent chest pain.  Patient has follow-up with cardiology on 07/03/2018 in [place].  Patient is still currently smoking.  Patient has made significant progress with smoking.  Patient is down to 4 to 5 cigarettes a day.  At last appointment patient was smoking  1/2 packs/day.   No Known Allergies  Immunization History  Administered Date(s) Administered  . Influenza Split 08/25/2016  . Influenza,inj,Quad PF,6+ Mos  09/25/2017  . Pneumococcal Polysaccharide-23 05/23/2018   Flu vaccine needed in - sept / oct  2019  Past Medical History:  Diagnosis Date  . Anxiety   . COPD (chronic obstructive pulmonary disease) (HCC)     Tobacco History: Social History   Tobacco Use  Smoking Status Current Every Day Smoker  . Packs/day: 1.50  . Years: 42.00  . Pack years: 63.00  . Types: Cigarettes  Smokeless Tobacco Never Used  Tobacco Comment   4-5 cigarettes/day 06/18/2018   Ready to quit: Yes Counseling given: Yes Comment: 4-5 cigarettes/day 06/18/2018 Discussed with patient need to stop smoking.  Encourage patient to use reduced to quit method.  Also discussed nicotine patches.  It is extremely important for [place] to decrease her smoking to 0 cigarettes for [place] to be able to adequately manage her respiratory status.   Also praised patient for recent progress.  She is now down to 4 to 5 cigarettes a day.  Smoking cessation information as well as resources provided for patient today.  Outpatient Encounter Medications as of 06/18/2018  Medication Sig  . albuterol (PROVENTIL HFA;VENTOLIN HFA) 108 (90 Base) MCG/ACT inhaler Inhale 2 puffs into the lungs every 6 (six) hours as needed for wheezing or shortness of breath.  . ALPRAZolam (XANAX) 0.5 MG tablet Take 1 tablet (0.5 mg total) by mouth 2 (two) times daily as needed. for anxiety  . busPIRone (BUSPAR) 10 MG tablet TAKE 1 TABLET BY MOUTH THREE TIMES DAILY  . carvedilol (COREG) 3.125 MG tablet Take 1 tablet (3.125 mg total) by mouth 2 (two) times daily with a meal. (Patient taking differently: Take 3.125 mg by mouth 2 (two) times daily with a meal. Take 1/2 tablet two times daily)  . doxepin (SINEQUAN) 25 MG capsule TAKE 1 TO 2 CAPSULES BY MOUTH ONCE DAILY AT BEDTIME  . DULoxetine (CYMBALTA) 60 MG capsule TAKE 2 CAPSULES BY MOUTH ONCE DAILY  . INCRUSE ELLIPTA 62.5 MCG/INH AEPB INHALE 1 PUFF BY MOUTH ONCE DAILY  . ipratropium-albuterol (DUONEB) 0.5-2.5 (3)  MG/3ML SOLN Take 3 mLs every 6 (six) hours as needed by nebulization.  . Multiple Vitamin (MULTIVITAMIN WITH MINERALS) TABS tablet Take 1 tablet by mouth daily.  . nicotine (NICODERM CQ - DOSED IN MG/24 HOURS) 21 mg/24hr patch Place 1 patch (21 mg total) onto the skin daily.  Marland Kitchen oxybutynin (DITROPAN-XL) 10 MG 24 hr tablet TAKE 1 TABLET BY MOUTH AT BEDTIME FOR  OVERACTIVE  BLADDER  . Respiratory Therapy Supplies (NEBULIZER/TUBING/MOUTHPIECE) KIT 1 Units 4 (four) times daily by Does not apply route. GIVE face mask and tubing for nebulizer  . rOPINIRole (REQUIP) 0.5 MG tablet TAKE 1 TO 2 TABLETS BY MOUTH AT BEDTIME  . Fluticasone-Umeclidin-Vilant (TRELEGY ELLIPTA) 100-62.5-25 MCG/INH AEPB Inhale 1 puff into the lungs daily.   No facility-administered encounter medications on file as of 06/18/2018.      Review of Systems  Constitutional: +fatigue  No  weight loss, night sweats,  fevers, chills HEENT:   No headaches,  Difficulty swallowing,  Tooth/dental problems, or  Sore throat, No sneezing, itching, ear ache, nasal congestion, post nasal drip  CV: +intermittant chest pain  No orthopnea, PND, swelling in lower extremities, anasarca, dizziness, palpitations, syncope  GI: No heartburn, indigestion, abdominal pain, nausea, vomiting, diarrhea, change in bowel habits, loss of appetite, bloody stools Resp: +sob with exertion /  rest   No excess mucus, no productive cough,  No non-productive cough,  No coughing up of blood.  No change in color of mucus.  No wheezing.  No chest wall deformity Skin: +sun burn on LE bilaterally     GU: no dysuria, change in color of urine, no urgency or frequency.  No flank pain, no hematuria  MS:  No joint pain or swelling.  No decreased range of motion.  No back pain. Psych:  No change in mood or affect. No depression or anxiety.  No memory loss.    MMRC - Breathlessness Score 3 - I stop for breath after walking about 100 yards or after a few minutes on level ground  (isle at grocery store is 151f)   Physical Exam  BP 124/90   Pulse (!) 117   Ht _0  (1.626 m)   Wt 190 lb (86.2 kg)   SpO2 93%   BMI 32.61 kg/m   Wt Readings from Last 5 Encounters:  06/18/18 190 lb (86.2 kg)  06/09/18 191 lb 9.6 oz (86.9 kg)  06/02/18 188 lb 9.6 oz (85.5 kg)  05/28/18 188 lb (85.3 kg)  05/22/18 192 lb 0.3 oz (87.1 kg)     Physical Exam  Constitutional: She is oriented to person, place, and time and well-developed, well-nourished, and in no distress. No distress.  HENT:  Head: Normocephalic and atraumatic.  Right Ear: Hearing, tympanic membrane, external ear and ear canal normal.  Left Ear: Hearing, tympanic membrane, external ear and ear canal normal.  Mouth/Throat: Uvula is midline and oropharynx is clear and moist. Mucous membranes are dry. No oropharyngeal exudate.  Discolored/brown tongue on exam, without thrush, Mallampati III  Eyes: Pupils are equal, round, and reactive to light.  Neck: Normal range of motion. Neck supple. No JVD present.  Cardiovascular: Regular rhythm, S1 normal, S2 normal and normal heart sounds. Tachycardia present.  Pulses:      Carotid pulses are 2+ on the right side, and 2+ on the left side. Pulmonary/Chest: Effort normal. No accessory muscle usage. No respiratory distress. She has no decreased breath sounds. She has wheezes (slight exp wheeze ). She has no rhonchi.  Air movement throughout lobes, slight expiratory wheeze  Abdominal: Bowel sounds are normal.  Musculoskeletal: Normal range of motion. She exhibits no edema.  Lymphadenopathy:    She has no cervical adenopathy.  Neurological: She is alert and oriented to person, place, and time. Gait normal.  Skin: Skin is warm and dry. She is not diaphoretic.  LE sunburn bilaterally   Psychiatric: Mood, memory, affect and judgment normal.  Nursing note and vitals reviewed.    Lab Results:  CBC    Component Value Date/Time   WBC 9.6 06/09/2018 1546   WBC 19.3 (H)  05/25/2018 0602   RBC 4.65 06/09/2018 1546   RBC 4.54 05/25/2018 0602   HGB 14.1 06/09/2018 1546   HCT 42.2 06/09/2018 1546   PLT 285 06/09/2018 1546   MCV 91 06/09/2018 1546   MCH 30.3 06/09/2018 1546   MCH 30.2 05/25/2018 0602   MCHC 33.4 06/09/2018 1546   MCHC 33.5 05/25/2018 0602   RDW 14.1 06/09/2018 1546   LYMPHSABS 2.8 06/09/2018 1546   MONOABS 0.5 05/25/2018 0602   EOSABS 0.2 06/09/2018 1546   BASOSABS 0.0 06/09/2018 1546    BMET    Component Value Date/Time   NA 140 06/02/2018 1105   K 4.5 06/02/2018 1105   CL 101 06/02/2018 1105   CO2  23 06/02/2018 1105   GLUCOSE 80 06/02/2018 1105   GLUCOSE 172 (H) 05/23/2018 0410   BUN 11 06/02/2018 1105   CREATININE 0.81 06/02/2018 1105   CALCIUM 9.7 06/02/2018 1105   GFRNONAA 81 06/02/2018 1105   GFRAA 93 06/02/2018 1105    BNP No results found for: BNP  ProBNP No results found for: PROBNP  Imaging: Dg Chest 2 View  Result Date: 05/21/2018 CLINICAL DATA:  Intermittent chest pain and short of breath EXAM: CHEST - 2 VIEW COMPARISON:  CT 05/08/2017, radiograph 04/25/2017 FINDINGS: Bronchitic changes are present. No focal consolidation or pleural effusion. Normal heart size. No pneumothorax. IMPRESSION: No active cardiopulmonary disease.  Bronchitic changes. Electronically Signed   By: Donavan Foil M.D.   On: 05/21/2018 19:26   Ct Angio Chest Pe W And/or Wo Contrast  Result Date: 05/21/2018 CLINICAL DATA:  Intermittent chest pain and shortness of breath for 4 days. Fever. EXAM: CT ANGIOGRAPHY CHEST WITH CONTRAST TECHNIQUE: Multidetector CT imaging of the chest was performed using the standard protocol during bolus administration of intravenous contrast. Multiplanar CT image reconstructions and MIPs were obtained to evaluate the vascular anatomy. CONTRAST:  157m ISOVUE-370 IOPAMIDOL (ISOVUE-370) INJECTION 76% COMPARISON:  05/08/2017 FINDINGS: Cardiovascular: Good opacification of the central and segmental pulmonary arteries. No  focal filling defects. No evidence of significant pulmonary embolus. Normal heart size. No pericardial effusion. Normal caliber thoracic aorta. No aortic dissection. Great vessel origins are patent. Mediastinum/Nodes: Esophagus is decompressed. No significant lymphadenopathy in the chest. Thyroid gland is unremarkable. Lungs/Pleura: Motion artifact limits examination. No airspace disease or consolidation in the lungs. No pleural effusions. No pneumothorax. Airways are patent. Mild emphysematous changes in the lungs. Scattered pulmonary nodules. Largest is in the superior segment of the right lower lung measuring less than 4 mm diameter. This nodule was not present previously and could represent developing nodule. Six-month follow-up recommended. Upper Abdomen: No acute abnormality. Musculoskeletal: No chest wall abnormality. No acute or significant osseous findings. Review of the MIP images confirms the above findings. IMPRESSION: No evidence of significant pulmonary embolus. No evidence of active pulmonary disease. Scattered pulmonary nodules, largest measuring less than 4 mm diameter. This nodule is new since previous study and may represent developing nodule. Six-month follow-up study is recommended. Emphysema (ICD10-J43.9). Electronically Signed   By: WLucienne CapersM.D.   On: 05/21/2018 21:42     Assessment & Plan:   Discussed extensively with patient need to stop smoking.  Patient agrees.  Patient will continue to proceed forward with trying to get to 0 cigarettes a day.  Praised patient for her progress and being down to 4 to 5 cigarettes.  Will have patient get back into her low lung cancer screening program with her six-month CT.  Have discussed with our coordinator.  Patient will get low-dose CT screening late December 2019/ early January 2020 for surveillance of right lower lobe nodule.  Discussed with patient she does not need a PET scan at this point in time.  Patient to have follow-up with  our office in 4 weeks.  Will do spirometry at that office visit.  Patient to keep follow-up with cardiology in August/2019.  GOLD COPD II D Trelegy Ellipta  >>> 1 puff daily in the morning >>>rinse mouth out after use  >>> This inhaler contains 3 medications that help manage her respiratory status, contact our office if you cannot afford this medication or unable to remain on this medication >>> this replaces your INCRUSE AND BREO  >>>  Call us after using your sample and let us know how you are doing.  Then we can proceed forward with an order and see if your insurance will cover this  Continue rescue inhaler use as needed for shortness of breath and wheezing >>> We do not want to using this more than 2-3 times a day anything more than that will not work  DuoNeb she can continue to use as needed every   We will order a CT without contrast in 6 months to follow the pulmonary nodule found during her hospitalization >>> Ensure that you complete this  Stop smoking: 1 800 QUIT NOW  >>> Patient to call this resource and utilize it to help support her quit smoking >>> Keep up your hard work with stopping smoking  You can also contact the [facility] >>>For smoking cessation classes call [phone]  We do not recommend using e-cigarettes as a form of stopping smoking 8 hours for shortness of breath and wheezing    Flu vaccine in September/October 2019  Follow-up with our office in 4 weeks  Note your daily symptoms > remember "red flags" for COPD:   >>>Increase in cough >>>increase in sputum production >>>increase in shortness of breath or activity  intolerance.   If you notice these symptoms, please call the office to be seen.    This appointment was 45 minutes along with her 50% of the time direct face-to-face patient care, assessment, plan of care discussion, follow-up.  Discussed extensively with patient as well as daughter and mother about pulmonary nodule  follow-up, COPD, need to stop smoking.  Lauraine Rinne, NP 06/18/2018

## 2018-06-17 NOTE — Telephone Encounter (Signed)
Called pt who stated she had a CT 7/3 due to pt having SOB and chest pains, and another nodule was shown on her lung and she was told that results would be sent to PCP. Pt states based on the results, a PET was preferred.  Pt states due to the nodule being benign and also less than 8mm that a PET scan was not needed.  Pt was hospitalized 7/3. Stated to pt due to her concerns about what was recommended to be done and since she was recently hospitalized, we should schedule her for an HFU appt. Pt expressed understanding.   Scheduled pt for an appt with Elisha HeadlandBrian Mack, NP tomorrow, 7/31 at 10:30. Nothing further needed.

## 2018-06-18 ENCOUNTER — Encounter: Payer: Self-pay | Admitting: Pulmonary Disease

## 2018-06-18 ENCOUNTER — Ambulatory Visit (INDEPENDENT_AMBULATORY_CARE_PROVIDER_SITE_OTHER): Payer: 59 | Admitting: Pulmonary Disease

## 2018-06-18 DIAGNOSIS — R918 Other nonspecific abnormal finding of lung field: Secondary | ICD-10-CM | POA: Diagnosis not present

## 2018-06-18 DIAGNOSIS — J439 Emphysema, unspecified: Secondary | ICD-10-CM

## 2018-06-18 DIAGNOSIS — Z720 Tobacco use: Secondary | ICD-10-CM | POA: Diagnosis not present

## 2018-06-18 MED ORDER — FLUTICASONE-UMECLIDIN-VILANT 100-62.5-25 MCG/INH IN AEPB: 1.0000 | INHALATION_SPRAY | Freq: Every day | RESPIRATORY_TRACT | 0 refills | Status: DC

## 2018-06-18 NOTE — Patient Instructions (Signed)
Trelegy Ellipta  >>> 1 puff daily in the morning >>>rinse mouth out after use  >>> This inhaler contains 3 medications that help manage her respiratory status, contact our office if you cannot afford this medication or unable to remain on this medication >>> this replaces your INCRUSE AND BREO  >>> Call us after using your sample and let us know how you are doing.  Then we can proceed forward with an order and see if your insurance will cover this  Continue rescue inhaler use as needed for shortness of breath and wheezing >>> We do not want to using this more than 2-3 times a day anything more than that will not work  DuoNeb she can continue to use as needed every   We will order a CT without contrast in 6 months to follow the pulmonary nodule found during her hospitalization >>> Ensure that you complete this  Stop smoking: 1 800 QUIT NOW  >>> Patient to call this resource and utilize it to help support her quit smoking >>> Keep up your hard work with stopping smoking  You can also contact the [facility] >>>For smoking cessation classes call [phone]  We do not recommend using e-cigarettes as a form of stopping smoking 8 hours for shortness of breath and wheezing    Flu vaccine in September/October 2019  Follow-up with our office in 4 weeks  Note your daily symptoms > remember "red flags" for COPD:   >>>Increase in cough >>>increase in sputum production >>>increase in shortness of breath or activity  intolerance.   If you notice these symptoms, please call the office to be seen.    Please contact the office if your symptoms worsen or you have concerns that you are not improving.   Thank you for choosing [facility] for your healthcare, and for allowing us to partner with you on your healthcare journey. I am thankful to be able to provide care to you today.   Elisha HeadlandBrian Mack FNP-C    Coping with Quitting Smoking Quitting smoking is a physical  and mental challenge. You will face cravings, withdrawal symptoms, and temptation. Before quitting, work with your health care provider to make a plan that can help you cope. Preparation can help you quit and keep you from giving in. How can I cope with cravings? Cravings usually last for 5-10 minutes. If you get through it, the craving will pass. Consider taking the following actions to help you cope with cravings:  Keep your mouth busy: ? Chew sugar-free gum. ? Suck on hard candies or a straw. ? Brush your teeth.  Keep your hands and body busy: ? Immediately change to a different activity when you feel a craving. ? Squeeze or play with a ball. ? Do an activity or a hobby, like making bead jewelry, practicing needlepoint, or working with wood. ? Mix up your normal routine. ? Take a short exercise break. Go for a quick walk or run up and down stairs. ? Spend time in public places where smoking is not allowed.  Focus on doing something kind or helpful for someone else.  Call a friend or family member to talk during a craving.  Join a support group.  Call a quit line, such as 1-800-QUIT-NOW.  Talk with your health care provider about medicines that might help you cope with cravings and make quitting easier for you.  How can I deal with withdrawal symptoms? Your body may experience negative effects as it  tries to get used to not having nicotine in the system. These effects are called withdrawal symptoms. They may include:  Feeling hungrier than normal.  Trouble concentrating.  Irritability.  Trouble sleeping.  Feeling depressed.  Restlessness and agitation.  Craving a cigarette.  To manage withdrawal symptoms:  Avoid places, people, and activities that trigger your cravings.  Remember why you want to quit.  Get plenty of sleep.  Avoid coffee and other caffeinated drinks. These may worsen some of your symptoms.  How can I handle social situations? Social situations  can be difficult when you are quitting smoking, especially in the first few weeks. To manage this, you can:  Avoid parties, bars, and other social situations where people might be smoking.  Avoid alcohol.  Leave right away if you have the urge to smoke.  Explain to your family and friends that you are quitting smoking. Ask for understanding and support.  Plan activities with friends or family where smoking is not an option.  What are some ways I can cope with stress? Wanting to smoke may cause stress, and stress can make you want to smoke. Find ways to manage your stress. Relaxation techniques can help. For example:  Breathe slowly and deeply, in through your nose and out through your mouth.  Listen to soothing, relaxing music.  Talk with a family member or friend about your stress.  Light a candle.  Soak in a bath or take a shower.  Think about a peaceful place.  What are some ways I can prevent weight gain? Be aware that many people gain weight after they quit smoking. However, not everyone does. To keep from gaining weight, have a plan in place before you quit and stick to the plan after you quit. Your plan should include:  Having healthy snacks. When you have a craving, it may help to: ? Eat plain popcorn, crunchy carrots, celery, or other cut vegetables. ? Chew sugar-free gum.  Changing how you eat: ? Eat small portion sizes at meals. ? Eat 4-6 small meals throughout the day instead of 1-2 large meals a day. ? Be mindful when you eat. Do not watch television or do other things that might distract you as you eat.  Exercising regularly: ? Make time to exercise each day. If you do not have time for a long workout, do short bouts of exercise for 5-10 minutes several times a day. ? Do some form of strengthening exercise, like weight lifting, and some form of aerobic exercise, like running or swimming.  Drinking plenty of water or other low-calorie or no-calorie drinks.  Drink 6-8 glasses of water daily, or as much as instructed by your health care provider.  Summary  Quitting smoking is a physical and mental challenge. You will face cravings, withdrawal symptoms, and temptation to smoke again. Preparation can help you as you go through these challenges.  You can cope with cravings by keeping your mouth busy (such as by chewing gum), keeping your body and hands busy, and making calls to family, friends, or a helpline for people who want to quit smoking.  You can cope with withdrawal symptoms by avoiding places where people smoke, avoiding drinks with caffeine, and getting plenty of rest.  Ask your health care provider about the different ways to prevent weight gain, avoid stress, and handle social situations. This information is not intended to replace advice given to you by your health care provider. Make sure you discuss any questions you have with  your health care provider. Document Released: 11/02/2016 Document Revised: 11/02/2016 Document Reviewed: 11/02/2016 Elsevier Interactive Patient Education  2018 [employer]  Chronic Obstructive Pulmonary Disease Chronic obstructive pulmonary disease (COPD) is a long-term (chronic) lung problem. When you have COPD, it is hard for air to get in and out of your lungs. The way your lungs work will never return to normal. Usually the condition gets worse over time. There are things you can do to keep yourself as healthy as possible. Your doctor may treat your condition with:  Medicines.  Quitting smoking, if you smoke.  Rehabilitation. This may involve a team of specialists.  Oxygen.  Exercise and changes to your diet.  Lung surgery.  Comfort measures (palliative care).  Follow these instructions at home: Medicines  Take over-the-counter and prescription medicines only as told by your doctor.  Talk to your doctor before taking any cough or allergy medicines. You may need to avoid medicines that cause  your lungs to be dry. Lifestyle  If you smoke, stop. Smoking makes the problem worse. If you need help quitting, ask your doctor.  Avoid being around things that make your breathing worse. This may include smoke, chemicals, and fumes.  Stay active, but remember to also rest.  Learn and use tips on how to relax.  Make sure you get enough sleep. Most adults need at least 7 hours a night.  Eat healthy foods. Eat smaller meals more often. Rest before meals. Controlled breathing  Learn and use tips on how to control your breathing as told by your doctor. Try: ? Breathing in (inhaling) through your nose for 1 second. Then, pucker your lips and breath out (exhale) through your lips for 2 seconds. ? Putting one hand on your belly (abdomen). Breathe in slowly through your nose for 1 second. Your hand on your belly should move out. Pucker your lips and breathe out slowly through your lips. Your hand on your belly should move in as you breathe out. Controlled coughing  Learn and use controlled coughing to clear mucus from your lungs. The steps are: 1. Lean your head a little forward. 2. Breathe in deeply. 3. Try to hold your breath for 3 seconds. 4. Keep your mouth slightly open while coughing 2 times. 5. Spit any mucus out into a tissue. 6. Rest and do the steps again 1 or 2 times as needed. General instructions  Make sure you get all the shots (vaccines) that your doctor recommends. Ask your doctor about a flu shot and a pneumonia shot.  Use oxygen therapy and therapy to help improve your lungs (pulmonary rehabilitation) if told by your doctor. If you need home oxygen therapy, ask your doctor if you should buy a tool to measure your oxygen level (oximeter).  Make a COPD action plan with your doctor. This helps you know what to do if you feel worse than usual.  Manage any other conditions you have as told by your doctor.  Avoid going outside when it is very hot, cold, or humid.  Avoid  people who have a sickness you can catch (contagious).  Keep all follow-up visits as told by your doctor. This is important. Contact a doctor if:  You cough up more mucus than usual.  There is a change in the color or thickness of the mucus.  It is harder to breathe than usual.  Your breathing is faster than usual.  You have trouble sleeping.  You need to use your medicines more often than  usual.  You have trouble doing your normal activities such as getting dressed or walking around the house. Get help right away if:  You have shortness of breath while resting.  You have shortness of breath that stops you from: ? Being able to talk. ? Doing normal activities.  Your chest hurts for longer than 5 minutes.  Your skin color is more blue than usual.  Your pulse oximeter shows that you have low oxygen for longer than 5 minutes.  You have a fever.  You feel too tired to breathe normally. Summary  Chronic obstructive pulmonary disease (COPD) is a long-term lung problem.  The way your lungs work will never return to normal. Usually the condition gets worse over time. There are things you can do to keep yourself as healthy as possible.  Take over-the-counter and prescription medicines only as told by your doctor.  If you smoke, stop. Smoking makes the problem worse. This information is not intended to replace advice given to you by your health care provider. Make sure you discuss any questions you have with your health care provider. Document Released: 04/23/2008 Document Revised: 04/12/2016 Document Reviewed: 07/02/2013 Elsevier Interactive Patient Education  2017 [employer].

## 2018-06-18 NOTE — Telephone Encounter (Signed)
She has a pulmonology appointment with Dr, Juanetta GoslingHawkins, but not until September

## 2018-06-18 NOTE — Assessment & Plan Note (Signed)
Will complete low-dose CT screening in 6 months to follow pulmonary nodules.  This will get you back into the lung cancer screening program so that we can continue to follow this accurately with scoring as well as with repeat serial imaging.  You do not need PET scan at this time.

## 2018-06-18 NOTE — Telephone Encounter (Signed)
Patient saw nurse practitioner at [facility] today.  She had been [place].  She was just trying to get a local physician because it was easier for transportation.  They have said to repeat in 6 months the CT.  So for now we can just continue with the recommendation.

## 2018-06-18 NOTE — Assessment & Plan Note (Addendum)
Trelegy Ellipta  >>> 1 puff daily in the morning >>>rinse mouth out after use  >>> This inhaler contains 3 medications that help manage her respiratory status, contact our office if you cannot afford this medication or unable to remain on this medication >>> this replaces your INCRUSE AND BREO  >>> Call us after using your sample and let us know how you are doing.  Then we can proceed forward with an order and see if your insurance will cover this  Continue rescue inhaler use as needed for shortness of breath and wheezing >>> We do not want to using this more than 2-3 times a day anything more than that will not work  DuoNeb she can continue to use as needed every   Stop smoking: 1 800 QUIT NOW  >>> Patient to call this resource and utilize it to help support her quit smoking >>> Keep up your hard work with stopping smoking  You can also contact the [facility] >>>For smoking cessation classes call [phone]  We do not recommend using e-cigarettes as a form of stopping smoking 8 hours for shortness of breath and wheezing    Flu vaccine in September/October 2019  Follow-up with our office in 4 weeks  Note your daily symptoms > remember "red flags" for COPD:   >>>Increase in cough >>>increase in sputum production >>>increase in shortness of breath or activity  intolerance.   If you notice these symptoms, please call the office to be seen.

## 2018-06-18 NOTE — Assessment & Plan Note (Signed)
Will order low-dose CT screening to be completed 6 months.  As following our lung cancer screening program.  That way we can accurately follow this with serial imaging.

## 2018-06-18 NOTE — Assessment & Plan Note (Signed)
  We will order a CT without contrast in 6 months to follow the pulmonary nodule found during her hospitalization >>> Ensure that you complete this  Stop smoking: 1 800 QUIT NOW  >>> Patient to call this resource and utilize it to help support her quit smoking >>> Keep up your hard work with stopping smoking  You can also contact the [facility] >>>For smoking cessation classes call [phone]  We do not recommend using e-cigarettes as a form of stopping smoking 8 hours for shortness of breath and wheezing    Flu vaccine in September/October 2019  Follow-up with our office in 4 weeks

## 2018-06-19 ENCOUNTER — Other Ambulatory Visit: Payer: Self-pay | Admitting: Pulmonary Disease

## 2018-06-19 DIAGNOSIS — J449 Chronic obstructive pulmonary disease, unspecified: Secondary | ICD-10-CM

## 2018-07-03 ENCOUNTER — Ambulatory Visit (INDEPENDENT_AMBULATORY_CARE_PROVIDER_SITE_OTHER): Payer: 59 | Admitting: Cardiology

## 2018-07-03 ENCOUNTER — Encounter: Payer: Self-pay | Admitting: Cardiology

## 2018-07-03 VITALS — BP 119/77 | HR 81 | Ht 64.0 in | Wt 184.4 lb

## 2018-07-03 DIAGNOSIS — R0600 Dyspnea, unspecified: Secondary | ICD-10-CM

## 2018-07-03 DIAGNOSIS — R002 Palpitations: Secondary | ICD-10-CM

## 2018-07-03 NOTE — Progress Notes (Signed)
Clinical Summary Alicia Sanchez is a 57 y.o.female seen as new consult, referred by PA Jones for palpitations and dyspnea.   1. DOE - admit 05/2018 with productive cough, fevers, chills. Treated fro COPD exacerbation. - Jan 2017 echo LVEF 57-26%, grade I diastolic dysfunction - can have some edema times. Palpitations with exertion.    2. COPD - no recent coughing or wheezing.    3. Palpitations - DOE short distances with palpitations. Can occur at rest, particularly at night - lasts a few minutes. Can feel her herself breathing heavy - occurs daily - coffee x 1-2 cups, no tea, diet mountain dews 585m x 3-4, rare etoh. Ongoing a few years.  - can be better with xanax.  - started on coreg < 1 month, symptoms better with beta blocker. Takes in AM, second dose prn.     Past Medical History:  Diagnosis Date  . Anxiety   . COPD (chronic obstructive pulmonary disease) (HCC)      No Known Allergies   Current Outpatient Medications  Medication Sig Dispense Refill  . albuterol (PROVENTIL HFA;VENTOLIN HFA) 108 (90 Base) MCG/ACT inhaler Inhale 2 puffs into the lungs every 6 (six) hours as needed for wheezing or shortness of breath. 1 Inhaler 6  . ALPRAZolam (XANAX) 0.5 MG tablet Take 1 tablet (0.5 mg total) by mouth 2 (two) times daily as needed. for anxiety 60 tablet 5  . busPIRone (BUSPAR) 10 MG tablet TAKE 1 TABLET BY MOUTH THREE TIMES DAILY 90 tablet 5  . carvedilol (COREG) 3.125 MG tablet Take 1 tablet (3.125 mg total) by mouth 2 (two) times daily with a meal. (Patient taking differently: Take 3.125 mg by mouth 2 (two) times daily with a meal. Take 1/2 tablet two times daily) 60 tablet 1  . doxepin (SINEQUAN) 25 MG capsule TAKE 1 TO 2 CAPSULES BY MOUTH ONCE DAILY AT BEDTIME 180 capsule 0  . DULoxetine (CYMBALTA) 60 MG capsule TAKE 2 CAPSULES BY MOUTH ONCE DAILY 180 capsule 0  . Fluticasone-Umeclidin-Vilant (TRELEGY ELLIPTA) 100-62.5-25 MCG/INH AEPB Inhale 1 puff into the lungs  daily. 1 each 0  . INCRUSE ELLIPTA 62.5 MCG/INH AEPB INHALE 1 PUFF BY MOUTH ONCE DAILY 30 each 11  . ipratropium-albuterol (DUONEB) 0.5-2.5 (3) MG/3ML SOLN Take 3 mLs every 6 (six) hours as needed by nebulization. 360 mL 3  . Multiple Vitamin (MULTIVITAMIN WITH MINERALS) TABS tablet Take 1 tablet by mouth daily.    . nicotine (NICODERM CQ - DOSED IN MG/24 HOURS) 21 mg/24hr patch Place 1 patch (21 mg total) onto the skin daily. 28 patch 2  . oxybutynin (DITROPAN-XL) 10 MG 24 hr tablet TAKE 1 TABLET BY MOUTH AT BEDTIME FOR  OVERACTIVE  BLADDER 30 tablet 0  . Respiratory Therapy Supplies (NEBULIZER/TUBING/MOUTHPIECE) KIT 1 Units 4 (four) times daily by Does not apply route. GIVE face mask and tubing for nebulizer 1 each 11  . rOPINIRole (REQUIP) 0.5 MG tablet TAKE 1 TO 2 TABLETS BY MOUTH AT BEDTIME 180 tablet 0   No current facility-administered medications for this visit.      Past Surgical History:  Procedure Laterality Date  . ABDOMINAL HYSTERECTOMY       No Known Allergies    Family History  Problem Relation Age of Onset  . COPD Mother   . COPD Father   . Diabetes type II Sister   . Diabetes type II Brother   . COPD Maternal Grandfather      Social History Ms.  Sanchez reports that she has been smoking cigarettes. She has a 63.00 pack-year smoking history. She has never used smokeless tobacco. Alicia Sanchez reports that she does not drink alcohol.   Review of Systems CONSTITUTIONAL: No weight loss, fever, chills, weakness or fatigue.  HEENT: Eyes: No visual loss, blurred vision, double vision or yellow sclerae.No hearing loss, sneezing, congestion, runny nose or sore throat.  SKIN: No rash or itching.  CARDIOVASCULAR: per hpi RESPIRATORY: per hpi GASTROINTESTINAL: No anorexia, nausea, vomiting or diarrhea. No abdominal pain or blood.  GENITOURINARY: No burning on urination, no polyuria NEUROLOGICAL: No headache, dizziness, syncope, paralysis, ataxia, numbness or tingling  in the extremities. No change in bowel or bladder control.  MUSCULOSKELETAL: No muscle, back pain, joint pain or stiffness.  LYMPHATICS: No enlarged nodes. No history of splenectomy.  PSYCHIATRIC: + anxiety ENDOCRINOLOGIC: No reports of sweating, cold or heat intolerance. No polyuria or polydipsia.  Marland Kitchen   Physical Examination Vitals:   07/03/18 1431  BP: 119/77  Pulse: 81  SpO2: 97%   Vitals:   07/03/18 1431  Weight: 184 lb 6.4 oz (83.6 kg)  Height: _0  (1.626 m)    Gen: resting comfortably, no acute distress HEENT: no scleral icterus, pupils equal round and reactive, no palptable cervical adenopathy,  CV: RRR, no m/r/g, no jvd Resp: Clear to auscultation bilaterally GI: abdomen is soft, non-tender, non-distended, normal bowel sounds, no hepatosplenomegaly MSK: extremities are warm, no edema.  Skin: warm, no rash Neuro:  no focal deficits Psych: appropriate affect   Diagnostic Studies  Jan 2017 Study Conclusions  - Left ventricle: The cavity size was normal. Systolic function was   vigorous. The estimated ejection fraction was in the range of 65%   to 70%. Wall motion was normal; there were no regional wall   motion abnormalities. Doppler parameters are consistent with   abnormal left ventricular relaxation (grade 1 diastolic   dysfunction). There was no evidence of elevated ventricular   filling pressure by Doppler parameters. - Aortic valve: There was no regurgitation. - Aortic root: The aortic root was normal in size. - Mitral valve: Structurally normal valve. There was mild   regurgitation. - Right ventricle: The cavity size was normal. Wall thickness was   normal. Systolic function was normal. - Right atrium: The atrium was normal in size. - Tricuspid valve: There was mild regurgitation. - Pulmonic valve: There was no regurgitation. - Pulmonary arteries: Systolic pressure was within the normal   range. - Inferior vena cava: The vessel was normal in  size. - Pericardium, extracardiac: There was no pericardial effusion.   Assessment and Plan   1. Palpitations - EKG review shows chronic sinus tach.  - we will plan for a 1 week event monitor to further evaluate - counseled to wean caffeine intake.   2. Dyspnea - unclear if all related to her COPD. She relates some LE edema at times - we will obtain an echo to evaluate for possible structural heart disease   F/u 6 weeks.      Arnoldo Lenis, M.D.

## 2018-07-03 NOTE — Patient Instructions (Signed)
Medication Instructions:  Your physician recommends that you continue on your current medications as directed. Please refer to the Current Medication list given to you today.  Labwork: NONE  Testing/Procedures: Your physician has requested that you have an echocardiogram. Echocardiography is a painless test that uses sound waves to create images of your heart. It provides your doctor with information about the size and shape of your heart and how well your heart's chambers and valves are working. This procedure takes approximately one hour. There are no restrictions for this procedure.  Your physician has recommended that you wear an event monitor FOR 1 WEEK. Event monitors are medical devices that record the heart's electrical activity. Doctors most often us these monitors to diagnose arrhythmias. Arrhythmias are problems with the speed or rhythm of the heartbeat. The monitor is a small, portable device. You can wear one while you do your normal daily activities. This is usually used to diagnose what is causing palpitations/syncope (passing out).   Follow-Up: Your physician recommends that you schedule a follow-up appointment in: 6 WEEKS WITH DR. BRANCH   Any Other Special Instructions Will Be Listed Below (If Applicable).  If you need a refill on your cardiac medications before your next appointment, please call your pharmacy.

## 2018-07-04 ENCOUNTER — Other Ambulatory Visit: Payer: Self-pay | Admitting: Physician Assistant

## 2018-07-04 DIAGNOSIS — F331 Major depressive disorder, recurrent, moderate: Secondary | ICD-10-CM

## 2018-07-04 DIAGNOSIS — G2581 Restless legs syndrome: Secondary | ICD-10-CM

## 2018-07-08 ENCOUNTER — Telehealth: Payer: Self-pay | Admitting: Cardiology

## 2018-07-08 NOTE — Telephone Encounter (Signed)
Pre-cert Verification for the following procedure   °Echo scheduled for 07-11-2018  °

## 2018-07-10 ENCOUNTER — Ambulatory Visit (INDEPENDENT_AMBULATORY_CARE_PROVIDER_SITE_OTHER): Payer: 59 | Admitting: Physician Assistant

## 2018-07-10 ENCOUNTER — Encounter: Payer: Self-pay | Admitting: Physician Assistant

## 2018-07-10 VITALS — BP 115/80 | HR 108 | Temp 98.1°F | Ht 64.0 in | Wt 185.0 lb

## 2018-07-10 DIAGNOSIS — J441 Chronic obstructive pulmonary disease with (acute) exacerbation: Secondary | ICD-10-CM | POA: Diagnosis not present

## 2018-07-10 DIAGNOSIS — R300 Dysuria: Secondary | ICD-10-CM | POA: Diagnosis not present

## 2018-07-10 DIAGNOSIS — N3281 Overactive bladder: Secondary | ICD-10-CM | POA: Diagnosis not present

## 2018-07-10 DIAGNOSIS — N3001 Acute cystitis with hematuria: Secondary | ICD-10-CM

## 2018-07-10 DIAGNOSIS — R5383 Other fatigue: Secondary | ICD-10-CM

## 2018-07-10 LAB — URINALYSIS, COMPLETE
Bilirubin, UA: NEGATIVE
Glucose, UA: NEGATIVE
Ketones, UA: NEGATIVE
Leukocytes, UA: NEGATIVE
NITRITE UA: NEGATIVE
PH UA: 5.5 (ref 5.0–7.5)
Protein, UA: NEGATIVE
Specific Gravity, UA: <1.005 — ABNORMAL LOW (ref 1.005–1.030)
UUROB: 0.2 mg/dL (ref 0.2–1.0)

## 2018-07-10 LAB — MICROSCOPIC EXAMINATION: RENAL EPITHEL UA: NONE SEEN /HPF

## 2018-07-10 MED ORDER — OXYBUTYNIN CHLORIDE ER 10 MG PO TB24
ORAL_TABLET | ORAL | 3 refills | Status: DC
Start: 2018-07-10 — End: 2019-03-18

## 2018-07-10 MED ORDER — FLUTICASONE-UMECLIDIN-VILANT 100-62.5-25 MCG/INH IN AEPB: 1.0000 | INHALATION_SPRAY | Freq: Every day | RESPIRATORY_TRACT | 0 refills | Status: DC

## 2018-07-10 MED ORDER — SULFAMETHOXAZOLE-TRIMETHOPRIM 800-160 MG PO TABS: 1.0000 | ORAL_TABLET | Freq: Two times a day (BID) | ORAL | 0 refills | Status: DC

## 2018-07-10 NOTE — Patient Instructions (Signed)
In a few days you may receive a survey in the mail or online from [employer] regarding your visit with us today. Please take a moment to fill this out. Your feedback is very important to our whole office. It can help us better understand your needs as well as improve your experience and satisfaction. Thank you for taking your time to complete it. We care about you.  Karishma Unrein, PA-C  

## 2018-07-11 ENCOUNTER — Ambulatory Visit (INDEPENDENT_AMBULATORY_CARE_PROVIDER_SITE_OTHER): Payer: 59

## 2018-07-11 ENCOUNTER — Other Ambulatory Visit: Payer: Self-pay

## 2018-07-11 DIAGNOSIS — R0600 Dyspnea, unspecified: Secondary | ICD-10-CM | POA: Diagnosis not present

## 2018-07-11 LAB — URINE CULTURE

## 2018-07-15 NOTE — Progress Notes (Signed)
BP 115/80   Pulse (!) 108   Temp 98.1 F (36.7 C) (Oral)   Ht _0  (1.626 m)   Wt 185 lb (83.9 kg)   SpO2 93%   BMI 31.76 kg/m    Subjective:    Patient ID: Alicia Sanchez, female    DOB: 02-28-1961, 57 y.o.   MRN: [id number]  HPI: Alicia Sanchez is a 57 y.o. female presenting on 07/10/2018 for COPD (1 month reck) and Urinary Tract Infection  This patient comes in today for recheck on her COPD.  In addition she reports that she may have a urinary tract infection.  She has had pressure burning for several days.  She knows she is prone to get these.  Overall her COPD is still not very good.  She has seen her pulmonologist.  There have been no changes in the medications at this time.  Her oxygen on room air was 93%.  We have not had to go to the act of getting oxygen for her.  She denies any productive cough at this time.  She is very weak after being up for any amount of time.  She is still in the middle unable to return to work.  Note will be given for her to continue being out.  Past Medical History:  Diagnosis Date  . Anxiety   . COPD (chronic obstructive pulmonary disease) (HCC)    Relevant past medical, surgical, family and social history reviewed and updated as indicated. Interim medical history since our last visit reviewed. Allergies and medications reviewed and updated. DATA REVIEWED: CHART IN EPIC  Family History reviewed for pertinent findings.  Review of Systems  Allergies as of 07/10/2018   No Known Allergies     Medication List        Accurate as of 07/10/18 11:59 PM. Always use your most recent med list.          albuterol 108 (90 Base) MCG/ACT inhaler Commonly known as:  PROVENTIL HFA;VENTOLIN HFA Inhale 2 puffs into the lungs every 6 (six) hours as needed for wheezing or shortness of breath.   ALPRAZolam 0.5 MG tablet Commonly known as:  XANAX Take 1 tablet (0.5 mg total) by mouth 2 (two) times daily as needed. for anxiety   busPIRone 10 MG  tablet Commonly known as:  BUSPAR TAKE 1 TABLET BY MOUTH THREE TIMES DAILY   carvedilol 3.125 MG tablet Commonly known as:  COREG Take 1 tablet (3.125 mg total) by mouth 2 (two) times daily with a meal.   doxepin 25 MG capsule Commonly known as:  SINEQUAN TAKE 1 TO 2 CAPSULES BY MOUTH ONCE DAILY AT BEDTIME   DULoxetine 60 MG capsule Commonly known as:  CYMBALTA TAKE 2 CAPSULES BY MOUTH ONCE DAILY   Fluticasone-Umeclidin-Vilant 100-62.5-25 MCG/INH Aepb Inhale 1 puff into the lungs daily.   ipratropium-albuterol 0.5-2.5 (3) MG/3ML Soln Commonly known as:  DUONEB Take 3 mLs every 6 (six) hours as needed by nebulization.   multivitamin with minerals Tabs tablet Take 1 tablet by mouth daily.   Nebulizer/Tubing/Mouthpiece Kit 1 Units 4 (four) times daily by Does not apply route. GIVE face mask and tubing for nebulizer   nicotine 21 mg/24hr patch Commonly known as:  NICODERM CQ - dosed in mg/24 hours Place 1 patch (21 mg total) onto the skin daily.   oxybutynin 10 MG 24 hr tablet Commonly known as:  DITROPAN-XL TAKE 1 TABLET BY MOUTH AT BEDTIME FOR  OVERACTIVE  BLADDER  rOPINIRole 1 MG tablet Commonly known as:  REQUIP TAKE 1 TO 4 TABLETS BY MOUTH AT BEDTIME   sulfamethoxazole-trimethoprim 800-160 MG tablet Commonly known as:  BACTRIM DS,SEPTRA DS Take 1 tablet by mouth 2 (two) times daily.          Objective:    BP 115/80   Pulse (!) 108   Temp 98.1 F (36.7 C) (Oral)   Ht _0  (1.626 m)   Wt 185 lb (83.9 kg)   SpO2 93%   BMI 31.76 kg/m   No Known Allergies  Wt Readings from Last 3 Encounters:  07/10/18 185 lb (83.9 kg)  07/03/18 184 lb 6.4 oz (83.6 kg)  06/18/18 190 lb (86.2 kg)    Physical Exam  Results for orders placed or performed in visit on 07/10/18  Urine Culture  Result Value Ref Range   Urine Culture, Routine Final report    Organism ID, Bacteria Comment   Microscopic Examination  Result Value Ref Range   WBC, UA 0-5 0 - 5 /hpf   RBC,  UA 3-10 (A) 0 - 2 /hpf   Epithelial Cells (non renal) 0-10 0 - 10 /hpf   Renal Epithel, UA None seen None seen /hpf   Bacteria, UA Few None seen/Few  Urinalysis, Complete  Result Value Ref Range   Specific Gravity, UA <1.005 (L) 1.005 - 1.030   pH, UA 5.5 5.0 - 7.5   Color, UA Yellow Yellow   Appearance Ur Clear Clear   Leukocytes, UA Negative Negative   Protein, UA Negative Negative/Trace   Glucose, UA Negative Negative   Ketones, UA Negative Negative   RBC, UA 2+ (A) Negative   Bilirubin, UA Negative Negative   Urobilinogen, Ur 0.2 0.2 - 1.0 mg/dL   Nitrite, UA Negative Negative   Microscopic Examination See below:       Assessment & Plan:   1. Dysuria - Urinalysis, Complete  2. COPD exacerbation ([place]) Continue medications Continue pulmonology visits  3. Fatigue, unspecified type Continue to increase activity  4. OAB (overactive bladder) - oxybutynin (DITROPAN-XL) 10 MG 24 hr tablet; TAKE 1 TABLET BY MOUTH AT BEDTIME FOR  OVERACTIVE  BLADDER  Dispense: 90 tablet; Refill: 3  5. Acute cystitis with hematuria - Urine Culture   Continue all other maintenance medications as listed above.  Follow up plan: Return in about 4 weeks (around 08/07/2018) for recheck.  Educational handout given for [place] PA-C [facility] [address]  [place], [place] [address] [phone]   07/15/2018, 7:49 AM

## 2018-07-16 ENCOUNTER — Ambulatory Visit (INDEPENDENT_AMBULATORY_CARE_PROVIDER_SITE_OTHER): Payer: 59 | Admitting: Pulmonary Disease

## 2018-07-16 ENCOUNTER — Encounter: Payer: Self-pay | Admitting: Pulmonary Disease

## 2018-07-16 VITALS — BP 108/78 | HR 94 | Ht 64.5 in | Wt 183.6 lb

## 2018-07-16 DIAGNOSIS — J439 Emphysema, unspecified: Secondary | ICD-10-CM | POA: Diagnosis not present

## 2018-07-16 DIAGNOSIS — J449 Chronic obstructive pulmonary disease, unspecified: Secondary | ICD-10-CM | POA: Diagnosis not present

## 2018-07-16 DIAGNOSIS — J9611 Chronic respiratory failure with hypoxia: Secondary | ICD-10-CM | POA: Diagnosis not present

## 2018-07-16 DIAGNOSIS — F17210 Nicotine dependence, cigarettes, uncomplicated: Secondary | ICD-10-CM | POA: Diagnosis not present

## 2018-07-16 DIAGNOSIS — Z720 Tobacco use: Secondary | ICD-10-CM

## 2018-07-16 DIAGNOSIS — R918 Other nonspecific abnormal finding of lung field: Secondary | ICD-10-CM

## 2018-07-16 DIAGNOSIS — Z23 Encounter for immunization: Secondary | ICD-10-CM | POA: Diagnosis not present

## 2018-07-16 MED ORDER — FLUTICASONE-UMECLIDIN-VILANT 100-62.5-25 MCG/INH IN AEPB: 1.0000 | INHALATION_SPRAY | Freq: Every day | RESPIRATORY_TRACT | 0 refills | Status: DC

## 2018-07-16 NOTE — Assessment & Plan Note (Signed)
We will order a CT without contrast in 6 months (JAN/2020) to follow the pulmonary nodule found during her hospitalization >>> Ensure that you complete this

## 2018-07-16 NOTE — Patient Instructions (Addendum)
Flu vaccine today  Spirometry today >>>Severe obstruction-we will order pulmonary function test  Trelegy Ellipta  >>> 1 puff daily in the morning >>>rinse mouth out after use  >>> This inhaler contains 3 medications that help manage her respiratory status, contact our office if you cannot afford this medication or unable to remain on this medication >>> Sample provided today in office  We will place order for DME to start you on POC >>>2 L pulsed with exertion   We emphasized the importance that you stop smoking Smoking cessation resources: 1 800 QUIT NOW  >>> Patient to call this resource and utilize it to help support her quit smoking >>> Keep up your hard work with stopping smoking  You can also contact the [facility] >>>For smoking cessation classes call [phone]  We do not recommend using e-cigarettes as a form of stopping smoking  Patient goal to be down to 5 cigarettes or less by next office visit.     Note your daily symptoms > remember "red flags" for COPD:   >>>Increase in cough >>>increase in sputum production >>>increase in shortness of breath or activity  intolerance.   If you notice these symptoms, please call the office to be seen.    Follow up in 2 months with our office    Please contact the office if your symptoms worsen or you have concerns that you are not improving.   Thank you for choosing [facility] for your healthcare, and for allowing [place] to partner with you on your healthcare journey. I am thankful to be able to provide care to you today.   Elisha Headland FNP-C      Chronic Obstructive Pulmonary Disease Chronic obstructive pulmonary disease (COPD) is a long-term (chronic) lung problem. When you have COPD, it is hard for air to get in and out of your lungs. The way your lungs work will never return to normal. Usually the condition gets worse over time. There are things you can do to keep yourself as healthy as  possible. Your doctor may treat your condition with:  Medicines.  Quitting smoking, if you smoke.  Rehabilitation. This may involve a team of specialists.  Oxygen.  Exercise and changes to your diet.  Lung surgery.  Comfort measures (palliative care).  Follow these instructions at home: Medicines  Take over-the-counter and prescription medicines only as told by your doctor.  Talk to your doctor before taking any cough or allergy medicines. You may need to avoid medicines that cause your lungs to be dry. Lifestyle  If you smoke, stop. Smoking makes the problem worse. If you need help quitting, ask your doctor.  Avoid being around things that make your breathing worse. This may include smoke, chemicals, and fumes.  Stay active, but remember to also rest.  Learn and use tips on how to relax.  Make sure you get enough sleep. Most adults need at least 7 hours a night.  Eat healthy foods. Eat smaller meals more often. Rest before meals. Controlled breathing  Learn and use tips on how to control your breathing as told by your doctor. Try: ? Breathing in (inhaling) through your nose for 1 second. Then, pucker your lips and breath out (exhale) through your lips for 2 seconds. ? Putting one hand on your belly (abdomen). Breathe in slowly through your nose for 1 second. Your hand on your belly should move out. Pucker your lips and breathe out slowly through your lips. Your hand on  your belly should move in as you breathe out. Controlled coughing  Learn and use controlled coughing to clear mucus from your lungs. The steps are: 1. Lean your head a little forward. 2. Breathe in deeply. 3. Try to hold your breath for 3 seconds. 4. Keep your mouth slightly open while coughing 2 times. 5. Spit any mucus out into a tissue. 6. Rest and do the steps again 1 or 2 times as needed. General instructions  Make sure you get all the shots (vaccines) that your doctor recommends. Ask your  doctor about a flu shot and a pneumonia shot.  Use oxygen therapy and therapy to help improve your lungs (pulmonary rehabilitation) if told by your doctor. If you need home oxygen therapy, ask your doctor if you should buy a tool to measure your oxygen level (oximeter).  Make a COPD action plan with your doctor. This helps you know what to do if you feel worse than usual.  Manage any other conditions you have as told by your doctor.  Avoid going outside when it is very hot, cold, or humid.  Avoid people who have a sickness you can catch (contagious).  Keep all follow-up visits as told by your doctor. This is important. Contact a doctor if:  You cough up more mucus than usual.  There is a change in the color or thickness of the mucus.  It is harder to breathe than usual.  Your breathing is faster than usual.  You have trouble sleeping.  You need to use your medicines more often than usual.  You have trouble doing your normal activities such as getting dressed or walking around the house. Get help right away if:  You have shortness of breath while resting.  You have shortness of breath that stops you from: ? Being able to talk. ? Doing normal activities.  Your chest hurts for longer than 5 minutes.  Your skin color is more blue than usual.  Your pulse oximeter shows that you have low oxygen for longer than 5 minutes.  You have a fever.  You feel too tired to breathe normally. Summary  Chronic obstructive pulmonary disease (COPD) is a long-term lung problem.  The way your lungs work will never return to normal. Usually the condition gets worse over time. There are things you can do to keep yourself as healthy as possible.  Take over-the-counter and prescription medicines only as told by your doctor.  If you smoke, stop. Smoking makes the problem worse. This information is not intended to replace advice given to you by your health care provider. Make sure you discuss  any questions you have with your health care provider. Document Released: 04/23/2008 Document Revised: 04/12/2016 Document Reviewed: 07/02/2013 Elsevier Interactive Patient Education  2017 [employer]  Coping with Quitting Smoking Quitting smoking is a physical and mental challenge. You will face cravings, withdrawal symptoms, and temptation. Before quitting, work with your health care provider to make a plan that can help you cope. Preparation can help you quit and keep you from giving in. How can I cope with cravings? Cravings usually last for 5-10 minutes. If you get through it, the craving will pass. Consider taking the following actions to help you cope with cravings:  Keep your mouth busy: ? Chew sugar-free gum. ? Suck on hard candies or a straw. ? Brush your teeth.  Keep your hands and body busy: ? Immediately change to a different activity when you feel a craving. ? Squeeze or  play with a ball. ? Do an activity or a hobby, like making bead jewelry, practicing needlepoint, or working with wood. ? Mix up your normal routine. ? Take a short exercise break. Go for a quick walk or run up and down stairs. ? Spend time in public places where smoking is not allowed.  Focus on doing something kind or helpful for someone else.  Call a friend or family member to talk during a craving.  Join a support group.  Call a quit line, such as 1-800-QUIT-NOW.  Talk with your health care provider about medicines that might help you cope with cravings and make quitting easier for you.  How can I deal with withdrawal symptoms? Your body may experience negative effects as it tries to get used to not having nicotine in the system. These effects are called withdrawal symptoms. They may include:  Feeling hungrier than normal.  Trouble concentrating.  Irritability.  Trouble sleeping.  Feeling depressed.  Restlessness and agitation.  Craving a cigarette.  To manage withdrawal  symptoms:  Avoid places, people, and activities that trigger your cravings.  Remember why you want to quit.  Get plenty of sleep.  Avoid coffee and other caffeinated drinks. These may worsen some of your symptoms.  How can I handle social situations? Social situations can be difficult when you are quitting smoking, especially in the first few weeks. To manage this, you can:  Avoid parties, bars, and other social situations where people might be smoking.  Avoid alcohol.  Leave right away if you have the urge to smoke.  Explain to your family and friends that you are quitting smoking. Ask for understanding and support.  Plan activities with friends or family where smoking is not an option.  What are some ways I can cope with stress? Wanting to smoke may cause stress, and stress can make you want to smoke. Find ways to manage your stress. Relaxation techniques can help. For example:  Breathe slowly and deeply, in through your nose and out through your mouth.  Listen to soothing, relaxing music.  Talk with a family member or friend about your stress.  Light a candle.  Soak in a bath or take a shower.  Think about a peaceful place.  What are some ways I can prevent weight gain? Be aware that many people gain weight after they quit smoking. However, not everyone does. To keep from gaining weight, have a plan in place before you quit and stick to the plan after you quit. Your plan should include:  Having healthy snacks. When you have a craving, it may help to: ? Eat plain popcorn, crunchy carrots, celery, or other cut vegetables. ? Chew sugar-free gum.  Changing how you eat: ? Eat small portion sizes at meals. ? Eat 4-6 small meals throughout the day instead of 1-2 large meals a day. ? Be mindful when you eat. Do not watch television or do other things that might distract you as you eat.  Exercising regularly: ? Make time to exercise each day. If you do not have time for a  long workout, do short bouts of exercise for 5-10 minutes several times a day. ? Do some form of strengthening exercise, like weight lifting, and some form of aerobic exercise, like running or swimming.  Drinking plenty of water or other low-calorie or no-calorie drinks. Drink 6-8 glasses of water daily, or as much as instructed by your health care provider.  Summary  Quitting smoking is a physical  and mental challenge. You will face cravings, withdrawal symptoms, and temptation to smoke again. Preparation can help you as you go through these challenges.  You can cope with cravings by keeping your mouth busy (such as by chewing gum), keeping your body and hands busy, and making calls to family, friends, or a helpline for people who want to quit smoking.  You can cope with withdrawal symptoms by avoiding places where people smoke, avoiding drinks with caffeine, and getting plenty of rest.  Ask your health care provider about the different ways to prevent weight gain, avoid stress, and handle social situations. This information is not intended to replace advice given to you by your health care provider. Make sure you discuss any questions you have with your health care provider. Document Released: 11/02/2016 Document Revised: 11/02/2016 Document Reviewed: 11/02/2016 Elsevier Interactive Patient Education  [employer].

## 2018-07-16 NOTE — Assessment & Plan Note (Signed)
  We will place order for DME to start you on POC >>>2 L pulsed with exertion   We emphasized the importance that you stop smoking Smoking cessation resources: 1 800 QUIT NOW  >>> Patient to call this resource and utilize it to help support her quit smoking >>> Keep up your hard work with stopping smoking  You can also contact the [facility] >>>For smoking cessation classes call [phone]  We do not recommend using e-cigarettes as a form of stopping smoking  Patient goal to be down to 5 cigarettes or less by next office visit.  Follow up in 2 months with our office

## 2018-07-16 NOTE — Assessment & Plan Note (Signed)
  We emphasized the importance that you stop smoking Smoking cessation resources: 1 800 QUIT NOW  >>> Patient to call this resource and utilize it to help support her quit smoking >>> Keep up your hard work with stopping smoking  You can also contact the [facility] >>>For smoking cessation classes call [phone]  We do not recommend using e-cigarettes as a form of stopping smoking  Patient goal to be down to 5 cigarettes or less by next office visit.  Follow up in 2 months with our office

## 2018-07-16 NOTE — Progress Notes (Signed)
'@Patient'  ID: Alicia Sanchez, female    DOB: 21-Mar-1961, 57 y.o.   MRN: [id number]  Chief Complaint  Patient presents with  . Follow-up    sob / copd    Referring provider: Terald Sleeper, PA-C  HPI: 57 year old female smoker followed in our office for COPD, suspected obstructive lung disease (has not completed sleep study), restless leg syndrome  PMH: Anxiety, depression Smoker/ Smoking History:4-5 cigarettes a day.  Pack year history 35+ a year.  Maintenance:  Trelegy Ellipta  Pt of: Dr. Vaughan Browner  07/16/2018  - Visit   57 year old patient seen for follow-up visit today.  Unfortunately patient has increased her smoking she is back to 1/2 pack a day or little bit more.  Patient reports that she is been feeling more short of breath lately.  Patient has recently completed an echocardiogram which showed normal systolic functioning.  Patient reports she is been adherent to her Trelegy Ellipta.  Primary care is provided samples to the patient.  Patient is requesting samples today.  Patient feels like she gets short of breath and her oxygen may drop.  Patient would like to be walked today for POC.   MMRC - Breathlessness Score 4 - I am too breathless to leave the house or I am breathlessness when dressing    Tests:   PFTs 07/24/17 FVC 2.54 (74%], FEV1 1.55 (58%], F/F 61, TLC 103%, DLCO 64% >>>Moderate obstruction and diffusion impairment with air trapping, mid flow reversibility with bronchodilator, DLCO 64, concavity and flow volume loops  Imaging:  Chest x-ray 07/22/15-interstitial marking consistent with bronchitis Chest x-ray 11/19/15-chronic interstitial marking Chest x-ray 11/20/15-chronic interstitial marking. Stable compared to prior Chest x-ray 04/25/17-hyperinflation, chronic interstitial marking. No acute process. Screening CT chest 05/08/17-centrilobular emphysema, calcified granuloma, tiny subcentimeter pulmonary nodules. 05/21/2018-CT Angio- no significant pulmonary emboli,  scattered pulmonary nodules, largest measuring less than 4 mm in diameter, this nodule is new since previous study and may represent a developing nodule, six-month follow-up is recommended  Cardiac:  11/21/2015-echocardiogram-LV ejection fraction 65 to 99%, grade 1 diastolic dysfunction  Labs:   Micro:   Chart Review:  05/21/2018-hospitalization-COPD exacerbation Discharge date 05/25/2018 Plan: Follow-up with PCP in 1 to 2 weeks, obtain [place] and CBC in 1 week, recommend PET scan of the chest to help clarify risk of lung nodules  06/02/2018- PCP follow-up Patient has stopped smoking, lab work repeated, concern over new lung nodule findings on CT Angio from hospitalization, PET scan ordered, referral to cards and pulm      Chart Review:      No Known Allergies  Immunization History  Administered Date(s) Administered  . Influenza Split 08/25/2016  . Influenza,inj,Quad PF,6+ Mos 09/25/2017, 07/16/2018  . Pneumococcal Polysaccharide-23 05/23/2018   Patient needs flu vaccine  Past Medical History:  Diagnosis Date  . Anxiety   . COPD (chronic obstructive pulmonary disease) (HCC)     Tobacco History: Social History   Tobacco Use  Smoking Status Current Every Day Smoker  . Packs/day: 1.50  . Years: 42.00  . Pack years: 63.00  . Types: Cigarettes  Smokeless Tobacco Never Used  Tobacco Comment   1/2 pack per day 07/03/2018 or more 07/16/18   Ready to quit: Not Answered Counseling given: Not Answered Comment: 1/2 pack per day 07/03/2018 or more 07/16/18 Discussed with patient extensively that she needs to stop smoking.  Explained to her and showed her FEV1 levels that dropped with continued smoking.  Smoking assessment and cessation counseling  Patient currently smoking:  0.5 packs/day I have advised the patient to quit/stop smoking as soon as possible due to high risk for multiple medical problems.  It will also be very difficult for [place] to manage patient's  respiratory  symptoms and status if we continue to expose her lungs to a known irritant.  We do not advise e-cigarettes as a form of stopping smoking.  Patient is willing to quit smoking.  I have advised the patient that we can assist and have options of nicotine replacement therapy, provided smoking cessation education today, provided smoking cessation counseling, and provided cessation resources.  Patient to use reduced to quit method.  Patient to be down to 5 cigarettes a day or less by next office visit in 2 months.  Follow-up next office visit office visit for assessment of smoking cessation.  Smoking cessation counseling advised for: 11 min   Outpatient Encounter Medications as of 07/16/2018  Medication Sig  . albuterol (PROVENTIL HFA;VENTOLIN HFA) 108 (90 Base) MCG/ACT inhaler Inhale 2 puffs into the lungs every 6 (six) hours as needed for wheezing or shortness of breath.  . ALPRAZolam (XANAX) 0.5 MG tablet Take 1 tablet (0.5 mg total) by mouth 2 (two) times daily as needed. for anxiety  . busPIRone (BUSPAR) 10 MG tablet TAKE 1 TABLET BY MOUTH THREE TIMES DAILY  . carvedilol (COREG) 3.125 MG tablet Take 1 tablet (3.125 mg total) by mouth 2 (two) times daily with a meal. (Patient taking differently: Take 3.125 mg by mouth 2 (two) times daily with a meal. Take 1/2 tablet two times daily)  . doxepin (SINEQUAN) 25 MG capsule TAKE 1 TO 2 CAPSULES BY MOUTH ONCE DAILY AT BEDTIME  . DULoxetine (CYMBALTA) 60 MG capsule TAKE 2 CAPSULES BY MOUTH ONCE DAILY  . Fluticasone-Umeclidin-Vilant (TRELEGY ELLIPTA) 100-62.5-25 MCG/INH AEPB Inhale 1 puff into the lungs daily.  Marland Kitchen ipratropium-albuterol (DUONEB) 0.5-2.5 (3) MG/3ML SOLN Take 3 mLs every 6 (six) hours as needed by nebulization.  . Multiple Vitamin (MULTIVITAMIN WITH MINERALS) TABS tablet Take 1 tablet by mouth daily.  Marland Kitchen oxybutynin (DITROPAN-XL) 10 MG 24 hr tablet TAKE 1 TABLET BY MOUTH AT BEDTIME FOR  OVERACTIVE  BLADDER  . Respiratory Therapy Supplies  (NEBULIZER/TUBING/MOUTHPIECE) KIT 1 Units 4 (four) times daily by Does not apply route. GIVE face mask and tubing for nebulizer  . rOPINIRole (REQUIP) 1 MG tablet TAKE 1 TO 4 TABLETS BY MOUTH AT BEDTIME  . sulfamethoxazole-trimethoprim (BACTRIM DS) 800-160 MG tablet Take 1 tablet by mouth 2 (two) times daily.  . Fluticasone-Umeclidin-Vilant (TRELEGY ELLIPTA) 100-62.5-25 MCG/INH AEPB Inhale 1 puff into the lungs daily.  . nicotine (NICODERM CQ - DOSED IN MG/24 HOURS) 21 mg/24hr patch Place 1 patch (21 mg total) onto the skin daily.   No facility-administered encounter medications on file as of 07/16/2018.      Review of Systems  Review of Systems  Constitutional: Positive for fatigue. Negative for chills, fever and unexpected weight change.  HENT: Negative for congestion, ear pain, postnasal drip, rhinorrhea, sneezing and sore throat.   Respiratory: Positive for cough, shortness of breath and wheezing. Negative for chest tightness.   Cardiovascular: Negative for chest pain and palpitations.  Gastrointestinal: Negative for blood in stool, diarrhea, nausea and vomiting.  Genitourinary: Negative for dysuria, frequency and urgency.  Musculoskeletal: Negative for arthralgias.  Skin: Negative for color change.  Allergic/Immunologic: Negative for environmental allergies and food allergies.  Neurological: Negative for dizziness, light-headedness and headaches.  Psychiatric/Behavioral: Negative for dysphoric mood. The patient is not nervous/anxious.  Physical Exam  BP 108/78   Pulse 94   Ht 5' 4.5" (1.638 m)   Wt 183 lb 9.6 oz (83.3 kg)   SpO2 93%   BMI 31.03 kg/m   Wt Readings from Last 5 Encounters:  07/16/18 183 lb 9.6 oz (83.3 kg)  07/10/18 185 lb (83.9 kg)  07/03/18 184 lb 6.4 oz (83.6 kg)  06/18/18 190 lb (86.2 kg)  06/09/18 191 lb 9.6 oz (86.9 kg)     Physical Exam  Constitutional: She is oriented to person, place, and time and well-developed, well-nourished, and in no  distress. No distress.  HENT:  Head: Normocephalic and atraumatic.  Right Ear: Hearing, tympanic membrane, external ear and ear canal normal.  Left Ear: Hearing, tympanic membrane, external ear and ear canal normal.  Nose: Nose normal. Right sinus exhibits no maxillary sinus tenderness and no frontal sinus tenderness. Left sinus exhibits no maxillary sinus tenderness and no frontal sinus tenderness.  Mouth/Throat: Uvula is midline and oropharynx is clear and moist. No oropharyngeal exudate.  Eyes: Pupils are equal, round, and reactive to light.  Neck: Normal range of motion. Neck supple. No JVD present.  Cardiovascular: Normal rate, regular rhythm and normal heart sounds.  Pulmonary/Chest: Effort normal. No accessory muscle usage. No respiratory distress. She has no decreased breath sounds. She has wheezes (exp wheeze slight). She has no rhonchi.  Abdominal: Soft. Bowel sounds are normal. There is no tenderness.  Musculoskeletal: Normal range of motion. She exhibits no edema.  Lymphadenopathy:    She has no cervical adenopathy.  Neurological: She is alert and oriented to person, place, and time. Gait normal.  Skin: Skin is warm and dry. She is not diaphoretic. No erythema.  Psychiatric: Mood, memory, affect and judgment normal.  Nursing note and vitals reviewed.    Lab Results:  CBC    Component Value Date/Time   WBC 9.6 06/09/2018 1546   WBC 19.3 (H) 05/25/2018 0602   RBC 4.65 06/09/2018 1546   RBC 4.54 05/25/2018 0602   HGB 14.1 06/09/2018 1546   HCT 42.2 06/09/2018 1546   PLT 285 06/09/2018 1546   MCV 91 06/09/2018 1546   MCH 30.3 06/09/2018 1546   MCH 30.2 05/25/2018 0602   MCHC 33.4 06/09/2018 1546   MCHC 33.5 05/25/2018 0602   RDW 14.1 06/09/2018 1546   LYMPHSABS 2.8 06/09/2018 1546   MONOABS 0.5 05/25/2018 0602   EOSABS 0.2 06/09/2018 1546   BASOSABS 0.0 06/09/2018 1546    BMET    Component Value Date/Time   NA 140 06/02/2018 1105   K 4.5 06/02/2018 1105   CL  101 06/02/2018 1105   CO2 23 06/02/2018 1105   GLUCOSE 80 06/02/2018 1105   GLUCOSE 172 (H) 05/23/2018 0410   BUN 11 06/02/2018 1105   CREATININE 0.81 06/02/2018 1105   CALCIUM 9.7 06/02/2018 1105   GFRNONAA 81 06/02/2018 1105   GFRAA 93 06/02/2018 1105    BNP No results found for: BNP  ProBNP No results found for: PROBNP  Imaging: No results found.     Assessment & Plan:   Pleasant 57 year old patient seen today in office visit.  Unfortunately patient's spirometry today showing worsening FEV1.  Patient's FEV1 is 36%.  Patient also walked in office in order placed for POC for 2 L pulsed with exertion.  Patient received flu vaccine today.  Patient to continue Trelegy Ellipta.  I will bring patient back in 2 months with a pulmonary function test.  Patient knows to present  to our office sooner if his she is having worsening breathing.  Emphasized extensively the patient needs to stop smoking.  Goal is for patient cigarettes consumption to be down to 5 cigarettes or less at next office visit.  Patient will need to complete repeat CT without contrast in January/2020 to follow CT Angio that was completed in July/2019.  GOLD COPD II D Flu vaccine today  Spirometry today >>>Severe obstruction-we will order pulmonary function test  Trelegy Ellipta  >>> 1 puff daily in the morning >>>rinse mouth out after use  >>> This inhaler contains 3 medications that help manage her respiratory status, contact our office if you cannot afford this medication or unable to remain on this medication >>> Sample provided today in office  We emphasized the importance that you stop smoking Smoking cessation resources: 1 800 QUIT NOW  >>> Patient to call this resource and utilize it to help support her quit smoking >>> Keep up your hard work with stopping smoking  You can also contact the [facility] >>>For smoking cessation classes call [phone]  We do not recommend using  e-cigarettes as a form of stopping smoking  Patient goal to be down to 5 cigarettes or less by next office visit.   Note your daily symptoms > remember "red flags" for COPD:   >>>Increase in cough >>>increase in sputum production >>>increase in shortness of breath or activity  intolerance.   If you notice these symptoms, please call the office to be seen.    Follow up in 2 months with our office     Tobacco abuse  We emphasized the importance that you stop smoking Smoking cessation resources: 1 [place]  >>> Patient to call this resource and utilize it to help support her quit smoking >>> Keep up your hard work with stopping smoking  You can also contact the [facility] >>>For smoking cessation classes call [phone]  We do not recommend using e-cigarettes as a form of stopping smoking  Patient goal to be down to 5 cigarettes or less by next office visit.  Follow up in 2 months with our office     Chronic respiratory failure with hypoxia ([place])  We will place order for DME to start you on POC >>>2 L pulsed with exertion   We emphasized the importance that you stop smoking Smoking cessation resources: 1 800 QUIT NOW  >>> Patient to call this resource and utilize it to help support her quit smoking >>> Keep up your hard work with stopping smoking  You can also contact the [facility] >>>For smoking cessation classes call [phone]  We do not recommend using e-cigarettes as a form of stopping smoking  Patient goal to be down to 5 cigarettes or less by next office visit.  Follow up in 2 months with our office     Abnormal findings on diagnostic imaging of lung We will order a CT without contrast in 6 months (JAN/2020) to follow the pulmonary nodule found during her hospitalization >>> Ensure that you complete this     Lauraine Rinne, NP 07/16/2018

## 2018-07-16 NOTE — Assessment & Plan Note (Signed)
Flu vaccine today  Spirometry today >>>Severe obstruction-we will order pulmonary function test  Trelegy Ellipta  >>> 1 puff daily in the morning >>>rinse mouth out after use  >>> This inhaler contains 3 medications that help manage her respiratory status, contact our office if you cannot afford this medication or unable to remain on this medication >>> Sample provided today in office  We emphasized the importance that you stop smoking Smoking cessation resources: 1 800 QUIT NOW  >>> Patient to call this resource and utilize it to help support her quit smoking >>> Keep up your hard work with stopping smoking  You can also contact the [facility] >>>For smoking cessation classes call [phone]  We do not recommend using e-cigarettes as a form of stopping smoking  Patient goal to be down to 5 cigarettes or less by next office visit.   Note your daily symptoms > remember "red flags" for COPD:   >>>Increase in cough >>>increase in sputum production >>>increase in shortness of breath or activity  intolerance.   If you notice these symptoms, please call the office to be seen.    Follow up in 2 months with our office

## 2018-07-16 NOTE — Progress Notes (Signed)
Discussed results with patient in office.  Nothing further is needed at this time. Will order formal PFTs.   Elisha HeadlandBrian Mack FNP

## 2018-07-17 ENCOUNTER — Encounter: Payer: Self-pay | Admitting: Physician Assistant

## 2018-07-17 ENCOUNTER — Ambulatory Visit: Payer: 59 | Admitting: Physician Assistant

## 2018-07-17 VITALS — BP 98/75 | HR 96 | Temp 98.9°F | Ht 64.5 in | Wt 182.6 lb

## 2018-07-17 DIAGNOSIS — L570 Actinic keratosis: Secondary | ICD-10-CM | POA: Insufficient documentation

## 2018-07-17 DIAGNOSIS — I809 Phlebitis and thrombophlebitis of unspecified site: Secondary | ICD-10-CM | POA: Diagnosis not present

## 2018-07-17 DIAGNOSIS — J42 Unspecified chronic bronchitis: Secondary | ICD-10-CM | POA: Diagnosis not present

## 2018-07-17 DIAGNOSIS — J439 Emphysema, unspecified: Secondary | ICD-10-CM | POA: Diagnosis not present

## 2018-07-21 NOTE — Progress Notes (Signed)
BP 98/75   Pulse 96   Temp 98.9 F (37.2 C) (Oral)   Ht 5' 4.5" (1.638 m)   Wt 182 lb 9.6 oz (82.8 kg)   SpO2 95%   BMI 30.86 kg/m    Subjective:    Patient ID: Alicia Sanchez, female    DOB: 1961/01/11, 57 y.o.   MRN: [id number]  HPI: Alicia Sanchez is a 57 y.o. female presenting on 07/17/2018 for Would like to see about to being released back to work and [place] checked  This patient comes in for recheck on her COPD.  Her most recent pulmonary function test shows that her COPD has actually worsened.  She knows that she is not able to go back to work fully do that activity she is supposed to do.  We are going to continue her absence through September 02, 2018 as we had done before.  She is working on a vacation for disability through the [employer] administration.  Things are at a standstill right now.  Of encouraged to continue with her specialist.  She reports that she has a little bit of cording along the ventral part of her right elbow she can feel her pain but there is no redness or swelling around it.  She also has an actinic keratosis that is somewhat irritated.  There is   Past Medical History:  Diagnosis Date  . Anxiety   . COPD (chronic obstructive pulmonary disease) (HCC)    Relevant past medical, surgical, family and social history reviewed and updated as indicated. Interim medical history since our last visit reviewed. Allergies and medications reviewed and updated. DATA REVIEWED: CHART IN EPIC  Family History reviewed for pertinent findings.  Review of Systems  Constitutional: Negative.   HENT: Negative.   Eyes: Negative.   Respiratory: Positive for cough, shortness of breath and wheezing. Negative for apnea.   Gastrointestinal: Negative.   Genitourinary: Negative.   Skin: Positive for color change.    Allergies as of 07/17/2018   No Known Allergies     Medication List        Accurate as of 07/17/18 11:59 PM. Always use your most recent med list.          albuterol 108 (90 Base) MCG/ACT inhaler Commonly known as:  PROVENTIL HFA;VENTOLIN HFA Inhale 2 puffs into the lungs every 6 (six) hours as needed for wheezing or shortness of breath.   ALPRAZolam 0.5 MG tablet Commonly known as:  XANAX Take 1 tablet (0.5 mg total) by mouth 2 (two) times daily as needed. for anxiety   busPIRone 10 MG tablet Commonly known as:  BUSPAR TAKE 1 TABLET BY MOUTH THREE TIMES DAILY   carvedilol 3.125 MG tablet Commonly known as:  COREG Take 1 tablet (3.125 mg total) by mouth 2 (two) times daily with a meal.   doxepin 25 MG capsule Commonly known as:  SINEQUAN TAKE 1 TO 2 CAPSULES BY MOUTH ONCE DAILY AT BEDTIME   DULoxetine 60 MG capsule Commonly known as:  CYMBALTA TAKE 2 CAPSULES BY MOUTH ONCE DAILY   Fluticasone-Umeclidin-Vilant 100-62.5-25 MCG/INH Aepb Inhale 1 puff into the lungs daily.   Fluticasone-Umeclidin-Vilant 100-62.5-25 MCG/INH Aepb Inhale 1 puff into the lungs daily.   ipratropium-albuterol 0.5-2.5 (3) MG/3ML Soln Commonly known as:  DUONEB Take 3 mLs every 6 (six) hours as needed by nebulization.   multivitamin with minerals Tabs tablet Take 1 tablet by mouth daily.   Nebulizer/Tubing/Mouthpiece Kit 1 Units 4 (four) times daily  by Does not apply route. GIVE face mask and tubing for nebulizer   nicotine 21 mg/24hr patch Commonly known as:  NICODERM CQ - dosed in mg/24 hours Place 1 patch (21 mg total) onto the skin daily.   oxybutynin 10 MG 24 hr tablet Commonly known as:  DITROPAN-XL TAKE 1 TABLET BY MOUTH AT BEDTIME FOR  OVERACTIVE  BLADDER   rOPINIRole 1 MG tablet Commonly known as:  REQUIP TAKE 1 TO 4 TABLETS BY MOUTH AT BEDTIME   sulfamethoxazole-trimethoprim 800-160 MG tablet Commonly known as:  BACTRIM DS,SEPTRA DS Take 1 tablet by mouth 2 (two) times daily.          Objective:    BP 98/75   Pulse 96   Temp 98.9 F (37.2 C) (Oral)   Ht 5' 4.5" (1.638 m)   Wt 182 lb 9.6 oz (82.8 kg)   SpO2 95%    BMI 30.86 kg/m   No Known Allergies  Wt Readings from Last 3 Encounters:  07/17/18 182 lb 9.6 oz (82.8 kg)  07/16/18 183 lb 9.6 oz (83.3 kg)  07/10/18 185 lb (83.9 kg)    Physical Exam  Constitutional: She is oriented to person, place, and time. She appears well-developed and well-nourished.  HENT:  Head: Normocephalic and atraumatic.  Eyes: Pupils are equal, round, and reactive to light. Conjunctivae and EOM are normal.  Cardiovascular: Normal rate, regular rhythm, normal heart sounds and intact distal pulses.  Pulmonary/Chest: Effort normal. No respiratory distress. She has decreased breath sounds.  Abdominal: Soft. Bowel sounds are normal.  Neurological: She is alert and oriented to person, place, and time. She has normal reflexes.  Skin: Skin is warm and dry. No rash noted.  Psychiatric: She has a normal mood and affect. Her behavior is normal. Judgment and thought content normal.        Assessment & Plan:   1. Chronic bronchitis, unspecified chronic bronchitis type ([place]) Year.  2. Pulmonary emphysema, unspecified emphysema type ([place]) Continue home medications Continue with pulmonologistI will get  3. Actin.ic keratosis Reassurance watch  4. Phlebitis reassured antibiotic if needed   Continue all other maintenance medications as listed above.  Follow up plan: Return for plan female exam, .  Educational handout given for [place] PA-C [place] [address]  [place], [place] [address] [phone]   07/21/2018, 9:49 PM

## 2018-07-23 ENCOUNTER — Telehealth: Payer: Self-pay | Admitting: *Deleted

## 2018-07-23 NOTE — Telephone Encounter (Signed)
-----   Message from Lesle Chris, LPN sent at 2/80/0349  3:18 PM EDT -----   ----- Message ----- From: Fonnie Birkenhead, CMA Sent: 07/14/2018   3:11 PM EDT To: Lesle Chris, LPN  [place] pt.  ----- Message ----- From: Antoine Poche, MD Sent: 07/14/2018   1:49 PM EDT To: Fonnie Birkenhead, CMA  Echo looks good, normal heart function.    [name] MD

## 2018-07-23 NOTE — Telephone Encounter (Signed)
Left multiple message for pt to return call - MyChart message send - routed to pcp

## 2018-07-29 ENCOUNTER — Telehealth: Payer: Self-pay | Admitting: *Deleted

## 2018-07-29 NOTE — Telephone Encounter (Signed)
Pt aware - routed to pcp  

## 2018-07-29 NOTE — Telephone Encounter (Signed)
-----   Message from Antoine Poche, MD sent at 07/28/2018  1:15 PM EDT ----- Heart monitor looks good, no significant abnormal heart rhythms.   Dina Rich MD

## 2018-08-03 ENCOUNTER — Other Ambulatory Visit: Payer: Self-pay | Admitting: Physician Assistant

## 2018-08-03 DIAGNOSIS — F331 Major depressive disorder, recurrent, moderate: Secondary | ICD-10-CM

## 2018-08-03 DIAGNOSIS — F411 Generalized anxiety disorder: Secondary | ICD-10-CM

## 2018-08-04 NOTE — Telephone Encounter (Signed)
Last office visit 07-17-18

## 2018-08-12 ENCOUNTER — Encounter: Payer: Self-pay | Admitting: Physician Assistant

## 2018-08-12 ENCOUNTER — Ambulatory Visit: Payer: 59 | Admitting: Physician Assistant

## 2018-08-12 ENCOUNTER — Ambulatory Visit (INDEPENDENT_AMBULATORY_CARE_PROVIDER_SITE_OTHER): Payer: 59 | Admitting: Physician Assistant

## 2018-08-12 VITALS — BP 114/80 | HR 95 | Temp 97.2°F | Ht 64.5 in | Wt 178.0 lb

## 2018-08-12 DIAGNOSIS — N3946 Mixed incontinence: Secondary | ICD-10-CM | POA: Diagnosis not present

## 2018-08-12 DIAGNOSIS — R300 Dysuria: Secondary | ICD-10-CM | POA: Diagnosis not present

## 2018-08-12 DIAGNOSIS — N393 Stress incontinence (female) (male): Secondary | ICD-10-CM | POA: Insufficient documentation

## 2018-08-12 DIAGNOSIS — G2581 Restless legs syndrome: Secondary | ICD-10-CM | POA: Diagnosis not present

## 2018-08-12 DIAGNOSIS — N3281 Overactive bladder: Secondary | ICD-10-CM | POA: Diagnosis not present

## 2018-08-12 DIAGNOSIS — J441 Chronic obstructive pulmonary disease with (acute) exacerbation: Secondary | ICD-10-CM

## 2018-08-12 LAB — URINALYSIS, COMPLETE
Bilirubin, UA: NEGATIVE
GLUCOSE, UA: NEGATIVE
KETONES UA: NEGATIVE
Leukocytes, UA: NEGATIVE
NITRITE UA: NEGATIVE
Protein, UA: NEGATIVE
Urobilinogen, Ur: 0.2 mg/dL (ref 0.2–1.0)
pH, UA: 6.5 (ref 5.0–7.5)

## 2018-08-12 LAB — MICROSCOPIC EXAMINATION
BACTERIA UA: NONE SEEN
Renal Epithel, UA: NONE SEEN /hpf
WBC, UA: NONE SEEN /hpf (ref 0–5)

## 2018-08-12 MED ORDER — ROPINIROLE HCL 1 MG PO TABS: ORAL_TABLET | ORAL | 3 refills | Status: DC

## 2018-08-12 MED ORDER — BACLOFEN 10 MG PO TABS: 10.0000 mg | ORAL_TABLET | Freq: Two times a day (BID) | ORAL | 2 refills | Status: DC

## 2018-08-12 NOTE — Progress Notes (Signed)
BP 114/80   Pulse 95   Temp (!) 97.2 F (36.2 C) (Oral)   Ht 5' 4.5" (1.638 m)   Wt 178 lb (80.7 kg)   SpO2 97%   BMI 30.08 kg/m    Subjective:    Patient ID: Alicia Sanchez, female    DOB: Aug 10, 1961, 57 y.o.   MRN: [id number]  HPI: Alicia Sanchez is a 57 y.o. female presenting on 08/12/2018 for COPD (4 week rck )  This patient comes in for periodic recheck on medications and conditions including restless legs, overactive bladder, urge incontinence, COPD.  She is having issues again with her bladder bothers her.  Will make a referral to urology.  There has not been an infection in the last few times.  COPD is still being followed by pulmonology.  And we will see her back as needed..   All medications are reviewed today. There are no reports of any problems with the medications. All of the medical conditions are reviewed and updated.  Lab work is reviewed and will be ordered as medically necessary. There are no new problems reported with today's visit.   Past Medical History:  Diagnosis Date  . Anxiety   . COPD (chronic obstructive pulmonary disease) (HCC)    Relevant past medical, surgical, family and social history reviewed and updated as indicated. Interim medical history since our last visit reviewed. Allergies and medications reviewed and updated. DATA REVIEWED: CHART IN EPIC  Family History reviewed for pertinent findings.  Review of Systems  Constitutional: Negative.  Negative for activity change, fatigue and fever.  HENT: Negative.   Eyes: Negative.   Respiratory: Positive for cough, shortness of breath and wheezing.   Cardiovascular: Negative.  Negative for chest pain.  Gastrointestinal: Negative.  Negative for abdominal pain.  Endocrine: Negative.   Genitourinary: Positive for enuresis, frequency and urgency. Negative for decreased urine volume, difficulty urinating, dysuria, flank pain and vaginal bleeding.  Musculoskeletal: Negative.   Skin: Negative.    Neurological: Negative.     Allergies as of 08/12/2018   No Known Allergies     Medication List        Accurate as of 08/12/18 11:59 PM. Always use your most recent med list.          albuterol 108 (90 Base) MCG/ACT inhaler Commonly known as:  PROVENTIL HFA;VENTOLIN HFA Inhale 2 puffs into the lungs every 6 (six) hours as needed for wheezing or shortness of breath.   ALPRAZolam 0.5 MG tablet Commonly known as:  XANAX TAKE 1 TABLET BY MOUTH TWICE DAILY AS NEEDED FOR ANXIETY   baclofen 10 MG tablet Commonly known as:  LIORESAL Take 1 tablet (10 mg total) by mouth 2 (two) times daily.   busPIRone 10 MG tablet Commonly known as:  BUSPAR TAKE 1 TABLET BY MOUTH THREE TIMES DAILY   carvedilol 3.125 MG tablet Commonly known as:  COREG Take 1 tablet (3.125 mg total) by mouth 2 (two) times daily with a meal.   doxepin 25 MG capsule Commonly known as:  SINEQUAN TAKE 1 TO 2 CAPSULES BY MOUTH ONCE DAILY AT BEDTIME   DULoxetine 60 MG capsule Commonly known as:  CYMBALTA TAKE 2 CAPSULES BY MOUTH ONCE DAILY   Fluticasone-Umeclidin-Vilant 100-62.5-25 MCG/INH Aepb Inhale 1 puff into the lungs daily.   ipratropium-albuterol 0.5-2.5 (3) MG/3ML Soln Commonly known as:  DUONEB Take 3 mLs every 6 (six) hours as needed by nebulization.   multivitamin with minerals Tabs tablet  Take 1 tablet by mouth daily.   Nebulizer/Tubing/Mouthpiece Kit 1 Units 4 (four) times daily by Does not apply route. GIVE face mask and tubing for nebulizer   nicotine 21 mg/24hr patch Commonly known as:  NICODERM CQ - dosed in mg/24 hours Place 1 patch (21 mg total) onto the skin daily.   oxybutynin 10 MG 24 hr tablet Commonly known as:  DITROPAN-XL TAKE 1 TABLET BY MOUTH AT BEDTIME FOR  OVERACTIVE  BLADDER   rOPINIRole 1 MG tablet Commonly known as:  REQUIP TAKE 1 TO 4 TABLETS BY MOUTH AT BEDTIME          Objective:    BP 114/80   Pulse 95   Temp (!) 97.2 F (36.2 C) (Oral)   Ht 5' 4.5"  (1.638 m)   Wt 178 lb (80.7 kg)   SpO2 97%   BMI 30.08 kg/m   No Known Allergies  Wt Readings from Last 3 Encounters:  08/12/18 178 lb (80.7 kg)  07/17/18 182 lb 9.6 oz (82.8 kg)  07/16/18 183 lb 9.6 oz (83.3 kg)    Physical Exam  Constitutional: She is oriented to person, place, and time. She appears well-developed and well-nourished.  HENT:  Head: Normocephalic and atraumatic.  Eyes: Pupils are equal, round, and reactive to light. Conjunctivae and EOM are normal.  Cardiovascular: Normal rate, regular rhythm, normal heart sounds and intact distal pulses.  Pulmonary/Chest: Effort normal. No respiratory distress. She has wheezes.  Abdominal: Soft. Bowel sounds are normal.  Neurological: She is alert and oriented to person, place, and time. She has normal reflexes.  Skin: Skin is warm and dry. No rash noted.  Psychiatric: She has a normal mood and affect. Her behavior is normal. Judgment and thought content normal.    Results for orders placed or performed in visit on 08/12/18  Urine Culture  Result Value Ref Range   Urine Culture, Routine Final report    Organism ID, Bacteria No growth   Microscopic Examination  Result Value Ref Range   WBC, UA None seen 0 - 5 /hpf   RBC, UA 0-2 0 - 2 /hpf   Epithelial Cells (non renal) 0-10 0 - 10 /hpf   Renal Epithel, UA None seen None seen /hpf   Bacteria, UA None seen None seen/Few  Urinalysis, Complete  Result Value Ref Range   Specific Gravity, UA <1.005 (L) 1.005 - 1.030   pH, UA 6.5 5.0 - 7.5   Color, UA Yellow Yellow   Appearance Ur Clear Clear   Leukocytes, UA Negative Negative   Protein, UA Negative Negative/Trace   Glucose, UA Negative Negative   Ketones, UA Negative Negative   RBC, UA 1+ (A) Negative   Bilirubin, UA Negative Negative   Urobilinogen, Ur 0.2 0.2 - 1.0 mg/dL   Nitrite, UA Negative Negative   Microscopic Examination See below:       Assessment & Plan:   1. Dysuria - Urine Culture - Urinalysis,  Complete - Microscopic Examination  2. Restless legs - rOPINIRole (REQUIP) 1 MG tablet; TAKE 1 TO 4 TABLETS BY MOUTH AT BEDTIME  Dispense: 360 tablet; Refill: 3  3. OAB (overactive bladder) - Microscopic Examination  4. Mixed stress and urge urinary incontinence Continue medication changes  5. COPD exacerbation ([place]) Continue maintenance medications and pulmonology  treatment   Continue all other maintenance medications as listed above.  Follow up plan: Return in about 6 weeks (around 09/23/2018).  Educational handout given for [employer]  Adah Salvage PA-C [place] [address]  [place], [place] [address] [phone]   08/18/2018, 8:04 AM

## 2018-08-13 LAB — URINE CULTURE: Organism ID, Bacteria: NO GROWTH

## 2018-08-15 ENCOUNTER — Ambulatory Visit: Payer: 59 | Admitting: Cardiology

## 2018-08-18 ENCOUNTER — Ambulatory Visit (INDEPENDENT_AMBULATORY_CARE_PROVIDER_SITE_OTHER): Payer: 59 | Admitting: Pulmonary Disease

## 2018-08-18 ENCOUNTER — Encounter: Payer: Self-pay | Admitting: Pulmonary Disease

## 2018-08-18 VITALS — BP 126/78 | HR 101 | Ht 62.59 in | Wt 174.0 lb

## 2018-08-18 DIAGNOSIS — J449 Chronic obstructive pulmonary disease, unspecified: Secondary | ICD-10-CM

## 2018-08-18 LAB — PULMONARY FUNCTION TEST
DL/VA % PRED: 67 %
DL/VA: 3.13 ml/min/mmHg/L
DLCO UNC: 12.55 ml/min/mmHg
DLCO unc % pred: 56 %
FEF 25-75 PRE: 0.5 L/s
FEF 25-75 Post: 0.59 L/sec
FEF2575-%Change-Post: 19 %
FEF2575-%PRED-PRE: 21 %
FEF2575-%Pred-Post: 25 %
FEV1-%Change-Post: 2 %
FEV1-%PRED-PRE: 48 %
FEV1-%Pred-Post: 49 %
FEV1-Post: 1.23 L
FEV1-Pre: 1.2 L
FEV1FVC-%Change-Post: 0 %
FEV1FVC-%Pred-Pre: 69 %
FEV6-%CHANGE-POST: 5 %
FEV6-%PRED-PRE: 68 %
FEV6-%Pred-Post: 71 %
FEV6-POST: 2.21 L
FEV6-Pre: 2.1 L
FEV6FVC-%Change-Post: 1 %
FEV6FVC-%PRED-POST: 100 %
FEV6FVC-%Pred-Pre: 99 %
FVC-%CHANGE-POST: 3 %
FVC-%PRED-POST: 70 %
FVC-%Pred-Pre: 68 %
FVC-Post: 2.26 L
FVC-Pre: 2.18 L
POST FEV6/FVC RATIO: 98 %
PRE FEV1/FVC RATIO: 55 %
Post FEV1/FVC ratio: 54 %
Pre FEV6/FVC Ratio: 96 %
RV % PRED: 193 %
RV: 3.63 L
TLC % pred: 123 %
TLC: 5.98 L

## 2018-08-18 NOTE — Patient Instructions (Signed)
Continue the trelegy and albuterol as needed Follow-up CT scheduled Continue to work on smoking cessation Follow-up with Dr. Juanetta Gosling at [facility]

## 2018-08-18 NOTE — Progress Notes (Addendum)
Alicia Sanchez    [id number]    03/03/1961  Primary Care Physician:Jones, Londell Moh, PA-C  Referring Physician: Terald Sleeper, PA-C [place], [place] [address]  Chief complaint:   Follow-up for COPD  HPI: Alicia Sanchez is a 57 year old active smoker with history of anxiety, depression, COPD. She was diagnosed with COPD several years ago but does not have any PFTs on record. She was started on breo and incruse in January 2018 by her primary care physician. She reports daily symptoms of dyspnea with activity, productive cough with white mucus production, chest congestion. She also symptoms of fatigue, exertion, anxiety  She has daily symptoms of snoring. She does sleep study 4 years ago but does not know the result of this test. She is a diagnosis of restless leg syndrome and is on requip. She did not have the sleep study done as it was not covered by insurance.  Pets:Dog which lives outside the house. No pets, exotic pets. Occupation:Customer sevice rep. previously worked d in [employer] Exposures: His current dust exposure and previous exposure to cotton fiber in her line of work Smoking history: 35-pack-year smoking history. Continues to smoke 1 pack per day  Interim history: Inhaler switched to Trelegy at last visit.  She feels this is more convenient for her than Breo and incruse Continues to have chronic dyspnea on exertion, chest congestion  Outpatient Encounter Medications as of 08/18/2018  Medication Sig  . albuterol (PROVENTIL HFA;VENTOLIN HFA) 108 (90 Base) MCG/ACT inhaler Inhale 2 puffs into the lungs every 6 (six) hours as needed for wheezing or shortness of breath.  . ALPRAZolam (XANAX) 0.5 MG tablet TAKE 1 TABLET BY MOUTH TWICE DAILY AS NEEDED FOR ANXIETY  . baclofen (LIORESAL) 10 MG tablet Take 1 tablet (10 mg total) by mouth 2 (two) times daily.  . busPIRone (BUSPAR) 10 MG tablet TAKE 1 TABLET BY MOUTH THREE TIMES DAILY  . carvedilol (COREG) 3.125 MG  tablet Take 1 tablet (3.125 mg total) by mouth 2 (two) times daily with a meal. (Patient taking differently: Take 3.125 mg by mouth 2 (two) times daily with a meal. Take 1/2 tablet two times daily)  . doxepin (SINEQUAN) 25 MG capsule TAKE 1 TO 2 CAPSULES BY MOUTH ONCE DAILY AT BEDTIME  . DULoxetine (CYMBALTA) 60 MG capsule TAKE 2 CAPSULES BY MOUTH ONCE DAILY  . Fluticasone-Umeclidin-Vilant (TRELEGY ELLIPTA) 100-62.5-25 MCG/INH AEPB Inhale 1 puff into the lungs daily.  Marland Kitchen ipratropium-albuterol (DUONEB) 0.5-2.5 (3) MG/3ML SOLN Take 3 mLs every 6 (six) hours as needed by nebulization.  . Multiple Vitamin (MULTIVITAMIN WITH MINERALS) TABS tablet Take 1 tablet by mouth daily.  . nicotine (NICODERM CQ - DOSED IN MG/24 HOURS) 21 mg/24hr patch Place 1 patch (21 mg total) onto the skin daily.  Marland Kitchen oxybutynin (DITROPAN-XL) 10 MG 24 hr tablet TAKE 1 TABLET BY MOUTH AT BEDTIME FOR  OVERACTIVE  BLADDER  . Respiratory Therapy Supplies (NEBULIZER/TUBING/MOUTHPIECE) KIT 1 Units 4 (four) times daily by Does not apply route. GIVE face mask and tubing for nebulizer  . rOPINIRole (REQUIP) 1 MG tablet TAKE 1 TO 4 TABLETS BY MOUTH AT BEDTIME   No facility-administered encounter medications on file as of 08/18/2018.    Physical Exam: Blood pressure 126/78, pulse (!) 101, height 5' 2.59" (1.59 m), weight 174 lb (78.9 kg), SpO2 95 %. Gen:      No acute distress HEENT:  EOMI, sclera anicteric Neck:  No masses; no thyromegaly Lungs:    Clear to auscultation bilaterally; normal respiratory effort CV:         Regular rate and rhythm; no murmurs Abd:      + bowel sounds; soft, non-tender; no palpable masses, no distension Ext:    No edema; adequate peripheral perfusion Skin:      Warm and dry; no rash Neuro: alert and oriented x 3 Psych: normal mood and affect  Data Reviewed: Imaging Chest x-ray 07/22/15-interstitial marking consistent with bronchitis Chest x-ray 11/19/15-chronic interstitial marking Chest x-ray  11/20/15-chronic interstitial marking. Stable compared to prior Chest x-ray 04/25/17-hyperinflation, chronic interstitial marking. No acute process. Screening CT chest 05/08/17-centrilobular emphysema, calcified granuloma, tiny subcentimeter pulmonary nodules. CT angio 05/21/2018- emphysema, scattered pulmonary nodule.  4 mm right lower lung nodule is new. I have reviewed the images personally.  PFTs 07/24/17 FVC 2.54 (74%], FEV1 1.55 (58%], F/F 61, TLC 103%, DLCO 64% Moderate obstruction and diffusion impairment with air trapping  Labs CBC/1/17-absolute eosinophil count 0 Alpha-1 antitrypsin 04/25/2017-136, PI MM  Assessment:  COPD, chronic bronchitis Currently on trelegy, albuterol Supplemental oxygen nebulizers as needed.  She wants to transfer care to Dr. Luan Pulling, [facility] since he is closer to where she lives.   Active smoker Continues to smoke heavily.  Smoking cessation.  She is using nicotine patches to try and quit. We would like to avoid Chantix or Wellbutrin given her psychiatric history.  Subcentimeter pulmonary nodule Follow-up CT in 6 months.   Restless leg syndrome, suspected sleep apnea On requip for restless leg syndrome.  Sleep study not done as it is not covered by insurance.  Plan/Recommendations: - Continue trelegy duo nebs as needed - CT in 6 months - Smoking cessation. Nicotine patches.  - Follow-up with Dr. Luan Pulling, Pulmonary in [place] Pars[place] MD [facility] 08/18/2018, 3:04 PM  CC: Terald Sleeper, PA-C

## 2018-08-18 NOTE — Progress Notes (Signed)
PFT completed today. 08/18/18  

## 2018-08-18 NOTE — Progress Notes (Signed)
Discussed results with patient in office with Dr. Isaiah Serge.  Nothing further is needed at this time.  Elisha Headland FNP

## 2018-08-19 ENCOUNTER — Other Ambulatory Visit: Payer: Self-pay | Admitting: Physician Assistant

## 2018-08-23 ENCOUNTER — Telehealth: Payer: Self-pay | Admitting: Physician Assistant

## 2018-08-25 ENCOUNTER — Other Ambulatory Visit: Payer: Self-pay | Admitting: Physician Assistant

## 2018-08-25 MED ORDER — CARVEDILOL 3.125 MG PO TABS: ORAL_TABLET | ORAL | 11 refills | Status: DC

## 2018-08-25 NOTE — Telephone Encounter (Signed)
Sent corrected rx  

## 2018-09-08 ENCOUNTER — Ambulatory Visit: Payer: 59

## 2018-09-09 ENCOUNTER — Ambulatory Visit: Payer: 59 | Admitting: Cardiology

## 2018-09-15 ENCOUNTER — Ambulatory Visit: Payer: 59 | Admitting: Pulmonary Disease

## 2018-09-16 ENCOUNTER — Encounter: Payer: Self-pay | Admitting: Family Medicine

## 2018-09-16 ENCOUNTER — Ambulatory Visit (INDEPENDENT_AMBULATORY_CARE_PROVIDER_SITE_OTHER): Payer: 59 | Admitting: Family Medicine

## 2018-09-16 VITALS — BP 115/84 | HR 108 | Temp 97.5°F | Ht 63.0 in | Wt 176.0 lb

## 2018-09-16 DIAGNOSIS — J441 Chronic obstructive pulmonary disease with (acute) exacerbation: Secondary | ICD-10-CM

## 2018-09-16 MED ORDER — HYDROCODONE-HOMATROPINE 5-1.5 MG/5ML PO SYRP: 5.0000 mL | ORAL_SOLUTION | Freq: Four times a day (QID) | ORAL | 0 refills | Status: AC | PRN

## 2018-09-16 MED ORDER — HYDROCODONE-HOMATROPINE 5-1.5 MG/5ML PO SYRP: 5.0000 mL | ORAL_SOLUTION | Freq: Four times a day (QID) | ORAL | 0 refills | Status: DC | PRN

## 2018-09-16 MED ORDER — BETAMETHASONE SOD PHOS & ACET 6 (3-3) MG/ML IJ SUSP
6.0000 mg | Freq: Once | INTRAMUSCULAR | Status: AC
  Administered 2018-09-16: 6 mg via INTRAMUSCULAR

## 2018-09-16 MED ORDER — LEVOFLOXACIN 500 MG PO TABS: 500.0000 mg | ORAL_TABLET | Freq: Every day | ORAL | 0 refills | Status: DC

## 2018-09-16 NOTE — Progress Notes (Signed)
Chief Complaint  Patient presents with  . Cough    HPI  Patient presents today for Patient presents with upper respiratory congestion. Rhinorrhea that is frequently purulent. There is moderate sore throat. Patient reports coughing frequently as well.  Green  sputum noted. There is no fever, chills or sweats. The patient denies being short of breath. Onset was 3 weeks ago. Gradually worsening. Tried keflex and prednisone dosepack  PMH: Smoking status noted ROS: Per HPI  Objective: BP 115/84   Pulse (!) 108   Temp (!) 97.5 F (36.4 C) (Oral)   Ht 5\' 3"  (1.6 m)   Wt 176 lb (79.8 kg)   SpO2 96%   BMI 31.18 kg/m  Gen: NAD, alert, cooperative with exam HEENT: NCAT, Nasal passages swollen, red TMS clear. Pharynx erythematous CV: RRR, good S1/S2, no murmur Resp: Bronchitis changes with scattered wheezes, non-labored Ext: No edema, warm Neuro: Alert and oriented, No gross deficits  Assessment and plan:  1. Acute exacerbation of chronic obstructive pulmonary disease (COPD) (HCC)     Meds ordered this encounter  Medications  . betamethasone acetate-betamethasone sodium phosphate (CELESTONE) injection 6 mg  . levofloxacin (LEVAQUIN) 500 MG tablet    Sig: Take 1 tablet (500 mg total) by mouth daily. For 10 days    Dispense:  10 tablet    Refill:  0  . DISCONTD: HYDROcodone-homatropine (HYCODAN) 5-1.5 MG/5ML syrup    Sig: Take 5 mLs by mouth every 6 (six) hours as needed for up to 5 days for cough.    Dispense:  100 mL    Refill:  0  . HYDROcodone-homatropine (HYCODAN) 5-1.5 MG/5ML syrup    Sig: Take 5 mLs by mouth every 6 (six) hours as needed for up to 5 days for cough.    Dispense:  100 mL    Refill:  0    No orders of the defined types were placed in this encounter.   Follow up as needed.  Mechele Claude, MD

## 2018-09-23 ENCOUNTER — Ambulatory Visit (INDEPENDENT_AMBULATORY_CARE_PROVIDER_SITE_OTHER): Payer: PRIVATE HEALTH INSURANCE | Admitting: Physician Assistant

## 2018-09-23 ENCOUNTER — Encounter: Payer: Self-pay | Admitting: Cardiology

## 2018-09-23 ENCOUNTER — Ambulatory Visit (INDEPENDENT_AMBULATORY_CARE_PROVIDER_SITE_OTHER): Payer: 59 | Admitting: Cardiology

## 2018-09-23 ENCOUNTER — Encounter: Payer: Self-pay | Admitting: Physician Assistant

## 2018-09-23 VITALS — BP 84/58 | HR 106 | Ht 64.0 in | Wt 174.0 lb

## 2018-09-23 VITALS — BP 105/77 | HR 98 | Temp 97.1°F | Ht 63.0 in | Wt 172.6 lb

## 2018-09-23 DIAGNOSIS — R002 Palpitations: Secondary | ICD-10-CM

## 2018-09-23 DIAGNOSIS — J441 Chronic obstructive pulmonary disease with (acute) exacerbation: Secondary | ICD-10-CM | POA: Diagnosis not present

## 2018-09-23 DIAGNOSIS — F411 Generalized anxiety disorder: Secondary | ICD-10-CM | POA: Diagnosis not present

## 2018-09-23 DIAGNOSIS — F418 Other specified anxiety disorders: Secondary | ICD-10-CM

## 2018-09-23 DIAGNOSIS — R0600 Dyspnea, unspecified: Secondary | ICD-10-CM

## 2018-09-23 MED ORDER — DILTIAZEM HCL 30 MG PO TABS: 30.0000 mg | ORAL_TABLET | Freq: Two times a day (BID) | ORAL | 1 refills | Status: DC | PRN

## 2018-09-23 MED ORDER — CARVEDILOL 3.125 MG PO TABS: 3.1250 mg | ORAL_TABLET | Freq: Two times a day (BID) | ORAL | 11 refills | Status: DC

## 2018-09-23 MED ORDER — MECLIZINE HCL 25 MG PO TABS: 25.0000 mg | ORAL_TABLET | Freq: Three times a day (TID) | ORAL | 0 refills | Status: DC | PRN

## 2018-09-23 NOTE — Patient Instructions (Signed)
Your physician recommends that you schedule a follow-up appointment in: AS NEEDED WITH DR [facility]  Your physician has recommended you make the following change in your medication:   STOP COREG  START DILTIAZEM 30 MG TWICE DAILY AS NEEDED FOR PALPITATIONS   Thank you for choosing [facility]!!

## 2018-09-23 NOTE — Progress Notes (Signed)
BP 105/77   Pulse 98   Temp (!) 97.1 F (36.2 C) (Oral)   Ht 5' 3" (1.6 m)   Wt 172 lb 9.6 oz (78.3 kg)   SpO2 98%   BMI 30.57 kg/m    Subjective:    Patient ID: Alicia Sanchez, female    DOB: [birth year], 57 y.o.   MRN: [id number]  HPI: Alicia Sanchez is a 57 y.o. female presenting on 09/23/2018 for COPD (6 week follow up)  This patient comes in for 1 month recheck on her severe COPD.  She is still working on disability.  She has gone to the consult of [employer].  Form will be completed for her.  She also needed her new FMLA completed.  This will be sent to our staff that performs these.  She states that she is able to walk less than 10 minutes without getting out of breath.  She has a cane to sit.  After standing 15 minutes she has some shortness of breath.  All of her medications are reviewed.  Is also of note that she is going to need a repeat CT in January 2020.  For the lung nodule she had.  She is of moderate risk.  Diagnosis code R 91.8.  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  She will be seeing her cardiologist very soon and follow-up pulmonology in coming months.  Her oxygen saturation is fairly good today but at times she will drop into the mid to low 80s.  I have encouraged her to use her oxygen on a fairly regular basis saying that her numbers are dropping more.    Past Medical History:  Diagnosis Date  . Anxiety   . COPD (chronic obstructive pulmonary disease) (HCC)    Relevant past medical, surgical, family and social history reviewed and updated as indicated. Interim medical history since our last visit reviewed. Allergies and medications reviewed and updated. DATA REVIEWED: CHART IN EPIC  Family History reviewed for pertinent findings.  Review of Systems  Constitutional: Positive for activity change and fatigue. Negative for diaphoresis and fever.  HENT: Negative.   Eyes: Negative.     Respiratory: Positive for cough, shortness of breath and wheezing.   Cardiovascular: Positive for leg swelling. Negative for chest pain and palpitations.  Gastrointestinal: Negative.   Genitourinary: Negative.   Musculoskeletal: Positive for arthralgias and back pain.    Allergies as of 09/23/2018   No Known Allergies     Medication List        Accurate as of 09/23/18 11:59 PM. Always use your most recent med list.          albuterol 108 (90 Base) MCG/ACT inhaler Commonly known as:  PROVENTIL HFA;VENTOLIN HFA Inhale 2 puffs into the lungs every 6 (six) hours as needed for wheezing or shortness of breath.   ALPRAZolam 0.5 MG tablet Commonly known as:  XANAX TAKE 1 TABLET BY MOUTH TWICE DAILY AS NEEDED FOR ANXIETY   baclofen 10 MG tablet Commonly known as:  LIORESAL Take 1 tablet (10 mg total) by mouth 2 (two) times daily.   busPIRone 10 MG tablet Commonly known as:  BUSPAR TAKE 1 TABLET BY MOUTH THREE TIMES DAILY   diltiazem 30 MG tablet Commonly known as:  CARDIZEM Take 1 tablet (30 mg total) by mouth 2 (two) times daily as needed.   doxepin 25 MG capsule Commonly known as:  SINEQUAN TAKE 1 TO  2 CAPSULES BY MOUTH ONCE DAILY AT BEDTIME   DULoxetine 60 MG capsule Commonly known as:  CYMBALTA TAKE 2 CAPSULES BY MOUTH ONCE DAILY   Fluticasone-Umeclidin-Vilant 100-62.5-25 MCG/INH Aepb Inhale 1 puff into the lungs daily.   ipratropium-albuterol 0.5-2.5 (3) MG/3ML Soln Commonly known as:  DUONEB Take 3 mLs every 6 (six) hours as needed by nebulization.   levofloxacin 500 MG tablet Commonly known as:  LEVAQUIN Take 1 tablet (500 mg total) by mouth daily. For 10 days   meclizine 25 MG tablet Commonly known as:  ANTIVERT Take 1 tablet (25 mg total) by mouth 3 (three) times daily as needed for dizziness.   multivitamin with minerals Tabs tablet Take 1 tablet by mouth daily.   Nebulizer/Tubing/Mouthpiece Kit 1 Units 4 (four) times daily by Does not apply route.  GIVE face mask and tubing for nebulizer   nicotine 21 mg/24hr patch Commonly known as:  NICODERM CQ - dosed in mg/24 hours Place 1 patch (21 mg total) onto the skin daily.   oxybutynin 10 MG 24 hr tablet Commonly known as:  DITROPAN-XL TAKE 1 TABLET BY MOUTH AT BEDTIME FOR  OVERACTIVE  BLADDER   rOPINIRole 1 MG tablet Commonly known as:  REQUIP TAKE 1 TO 4 TABLETS BY MOUTH AT BEDTIME          Objective:    BP 105/77   Pulse 98   Temp (!) 97.1 F (36.2 C) (Oral)   Ht 5' 3" (1.6 m)   Wt 172 lb 9.6 oz (78.3 kg)   SpO2 98%   BMI 30.57 kg/m   No Known Allergies  Wt Readings from Last 3 Encounters:  09/23/18 174 lb (78.9 kg)  09/23/18 172 lb 9.6 oz (78.3 kg)  09/16/18 176 lb (79.8 kg)    Physical Exam  Constitutional: She is oriented to person, place, and time. She appears well-developed and well-nourished.  HENT:  Head: Normocephalic and atraumatic.  Right Ear: Tympanic membrane, external ear and ear canal normal.  Left Ear: Tympanic membrane, external ear and ear canal normal.  Nose: Nose normal. No rhinorrhea.  Mouth/Throat: Oropharynx is clear and moist and mucous membranes are normal. No oropharyngeal exudate or posterior oropharyngeal erythema.  Eyes: Pupils are equal, round, and reactive to light. Conjunctivae and EOM are normal.  Neck: Normal range of motion. Neck supple.  Cardiovascular: Normal rate, regular rhythm, normal heart sounds and intact distal pulses.  Pulmonary/Chest: Effort normal and breath sounds normal.  Abdominal: Soft. Bowel sounds are normal.  Neurological: She is alert and oriented to person, place, and time. She has normal reflexes.  Skin: Skin is warm and dry. No rash noted.  Psychiatric: She has a normal mood and affect. Her behavior is normal. Judgment and thought content normal.    Results for orders placed or performed in visit on 08/18/18  Pulmonary function test  Result Value Ref Range   FVC-Pre 2.18 L   FVC-%Pred-Pre 68 %    FVC-Post 2.26 L   FVC-%Pred-Post 70 %   FVC-%Change-Post 3 %   FEV1-Pre 1.20 L   FEV1-%Pred-Pre 48 %   FEV1-Post 1.23 L   FEV1-%Pred-Post 49 %   FEV1-%Change-Post 2 %   FEV6-Pre 2.10 L   FEV6-%Pred-Pre 68 %   FEV6-Post 2.21 L   FEV6-%Pred-Post 71 %   FEV6-%Change-Post 5 %   Pre FEV1/FVC ratio 55 %   FEV1FVC-%Pred-Pre 69 %   Post FEV1/FVC ratio 54 %   FEV1FVC-%Change-Post 0 %   Pre FEV6/FVC   Ratio 96 %   FEV6FVC-%Pred-Pre 99 %   Post FEV6/FVC ratio 98 %   FEV6FVC-%Pred-Post 100 %   FEV6FVC-%Change-Post 1 %   FEF 25-75 Pre 0.50 L/sec   FEF2575-%Pred-Pre 21 %   FEF 25-75 Post 0.59 L/sec   FEF2575-%Pred-Post 25 %   FEF2575-%Change-Post 19 %   RV 3.63 L   RV % pred 193 %   TLC 5.98 L   TLC % pred 123 %   DLCO unc 12.55 ml/min/mmHg   DLCO unc % pred 56 %   DL/VA 3.13 ml/min/mmHg/L   DL/VA % pred 67 %      Assessment & Plan:   1. COPD exacerbation (HCC) Continue with all medications Continue pulmonology Continue to work on disability  2. Generalized anxiety disorder All medications are refilled  3. Depression with anxiety All medications are refilled   Continue all other maintenance medications as listed above.  Follow up plan: Return in about 2 months (around 11/23/2018) for recheck.  Educational handout given for survey   S.  PA-C [facility] [address]  [place], [place] [address] [phone]   09/24/2018, 1:52 PM  Out 7/3-01/15/2020 

## 2018-09-23 NOTE — Progress Notes (Signed)
Clinical Summary Alicia Sanchez is a 57 y.o.female seen today for follow up of the following medical problems.     1. COPD/DOE - no recent coughing or wheezing.  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 Palpitations - DOE short distances with palpitations. Can occur at rest, particularly at night - lasts a few minutes. Can feel her herself breathing heavy - occurs daily - coffee x 1-2 cups, no tea, diet mountain dews 562m x 3-4, rare etoh. Ongoing a few years.  - can be better with xanax.  - started on coreg < 1 month, symptoms better with beta blocker. Takes in AM, second dose prn.    06/2018 event monitor benign.  - still with palpitations.      Past Medical History:  Diagnosis Date  . Anxiety   . COPD (chronic obstructive pulmonary disease) (HCC)      No Known Allergies   Current Outpatient Medications  Medication Sig Dispense Refill  . albuterol (PROVENTIL HFA;VENTOLIN HFA) 108 (90 Base) MCG/ACT inhaler Inhale 2 puffs into the lungs every 6 (six) hours as needed for wheezing or shortness of breath. 1 Inhaler 6  . ALPRAZolam (XANAX) 0.5 MG tablet TAKE 1 TABLET BY MOUTH TWICE DAILY AS NEEDED FOR ANXIETY 60 tablet 5  . baclofen (LIORESAL) 10 MG tablet Take 1 tablet (10 mg total) by mouth 2 (two) times daily. 40 each 2  . busPIRone (BUSPAR) 10 MG tablet TAKE 1 TABLET BY MOUTH THREE TIMES DAILY 90 tablet 5  . carvedilol (COREG) 3.125 MG tablet Take 1 tablet (3.125 mg total) by mouth 2 (two) times daily with a meal. 60 tablet 11  . doxepin (SINEQUAN) 25 MG capsule TAKE 1 TO 2 CAPSULES BY MOUTH ONCE DAILY AT BEDTIME 180 capsule 0  . DULoxetine (CYMBALTA) 60 MG capsule TAKE 2 CAPSULES BY MOUTH ONCE DAILY 180 capsule 0  .  Fluticasone-Umeclidin-Vilant (TRELEGY ELLIPTA) 100-62.5-25 MCG/INH AEPB Inhale 1 puff into the lungs daily. 1 each 0  . ipratropium-albuterol (DUONEB) 0.5-2.5 (3) MG/3ML SOLN Take 3 mLs every 6 (six) hours as needed by nebulization. 360 mL 3  . levofloxacin (LEVAQUIN) 500 MG tablet Take 1 tablet (500 mg total) by mouth daily. For 10 days 10 tablet 0  . meclizine (ANTIVERT) 25 MG tablet Take 1 tablet (25 mg total) by mouth 3 (three) times daily as needed for dizziness. 60 tablet 0  . Multiple Vitamin (MULTIVITAMIN WITH MINERALS) TABS tablet Take 1 tablet by mouth daily.    . nicotine (NICODERM CQ - DOSED IN MG/24 HOURS) 21 mg/24hr patch Place 1 patch (21 mg total) onto the skin daily. 28 patch 2  . oxybutynin (DITROPAN-XL) 10 MG 24 hr tablet TAKE 1 TABLET BY MOUTH AT BEDTIME FOR  OVERACTIVE  BLADDER 90 tablet 3  . Respiratory Therapy Supplies (NEBULIZER/TUBING/MOUTHPIECE) KIT 1 Units 4 (four) times daily by Does not apply route. GIVE face mask and tubing for nebulizer 1 each 11  . rOPINIRole (REQUIP) 1 MG tablet TAKE 1 TO 4 TABLETS BY MOUTH AT BEDTIME 360 tablet 3   No current facility-administered medications for this visit.      Past Surgical History:  Procedure Laterality Date  .  ABDOMINAL HYSTERECTOMY       No Known Allergies    Family History  Problem Relation Age of Onset  . COPD Mother   . COPD Father   . Diabetes type II Sister   . Diabetes type II Brother   . COPD Maternal Grandfather      Social History Alicia Sanchez reports that she has been smoking cigarettes. She has a 21.00 pack-year smoking history. She has never used smokeless tobacco. Alicia Sanchez reports that she does not drink alcohol.   Review of Systems CONSTITUTIONAL: No weight loss, fever, chills, weakness or fatigue.  HEENT: Eyes: No visual loss, blurred vision, double vision or yellow sclerae.No hearing loss, sneezing, congestion, runny nose or sore throat.  SKIN: No rash or itching.  CARDIOVASCULAR:  per hpi RESPIRATORY: per hpi GASTROINTESTINAL: No anorexia, nausea, vomiting or diarrhea. No abdominal pain or blood.  GENITOURINARY: No burning on urination, no polyuria NEUROLOGICAL: No headache, dizziness, syncope, paralysis, ataxia, numbness or tingling in the extremities. No change in bowel or bladder control.  MUSCULOSKELETAL: No muscle, back pain, joint pain or stiffness.  LYMPHATICS: No enlarged nodes. No history of splenectomy.  PSYCHIATRIC: No history of depression or anxiety.  ENDOCRINOLOGIC: No reports of sweating, cold or heat intolerance. No polyuria or polydipsia.  Marland Kitchen   Physical Examination Vitals:   09/23/18 1545  BP: (!) 84/58  Pulse: (!) 106  SpO2: 96%   Vitals:   09/23/18 1545  Weight: 174 lb (78.9 kg)  Height: '5\' 4"'  (1.626 m)    Gen: resting comfortably, no acute distress HEENT: no scleral icterus, pupils equal round and reactive, no palptable cervical adenopathy,  CV: RRR, no m/r/g, no jvd Resp: Clear to auscultation bilaterally GI: abdomen is soft, non-tender, non-distended, normal bowel sounds, no hepatosplenomegaly MSK: extremities are warm, no edema.  Skin: warm, no rash Neuro:  no focal deficits Psych: appropriate affect   Diagnostic Studies Jan 2017 Study Conclusions  - Left ventricle: The cavity size was normal. Systolic function was vigorous. The estimated ejection fraction was in the range of 65% to 70%. Wall motion was normal; there were no regional wall motion abnormalities. Doppler parameters are consistent with abnormal left ventricular relaxation (grade 1 diastolic dysfunction). There was no evidence of elevated ventricular filling pressure by Doppler parameters. - Aortic valve: There was no regurgitation. - Aortic root: The aortic root was normal in size. - Mitral valve: Structurally normal valve. There was mild regurgitation. - Right ventricle: The cavity size was normal. Wall thickness was normal. Systolic  function was normal. - Right atrium: The atrium was normal in size. - Tricuspid valve: There was mild regurgitation. - Pulmonic valve: There was no regurgitation. - Pulmonary arteries: Systolic pressure was within the normal range. - Inferior vena cava: The vessel was normal in size. - Pericardium, extracardiac: There was no pericardial effusion.   06/2018 echo Study Conclusions  - Left ventricle: The cavity size was normal. Wall thickness was   normal. Systolic function was normal. The estimated ejection   fraction was in the range of 60% to 65%. Wall motion was normal;   there were no regional wall motion abnormalities. Left   ventricular diastolic function parameters were normal. - Aortic valve: Mildly calcified annulus. Probably trileaflet. - Mitral valve: Mildly calcified annulus. There was trivial   regurgitation. - Right ventricle: The cavity size was mildly dilated. - Right atrium: Central venous pressure (est): 3 mm Hg. - Atrial septum: No defect or patent foramen ovale was identified. -  Tricuspid valve: There was trivial regurgitation. - Pericardium, extracardiac: A prominent pericardial fat pad was   present.  06/2018 event monitor  7 day event monitor  Min HR 76, Max HR 143, Avg HR 93  Symptoms correlate with sinus rhythm and sinus tachycardia  No significant arrhythmias  Assessment and Plan   1. DOE/COPD - symptoms appear to be due to her severe COPD, no evidence of cardiac component. Normal echo. - copd management per primary team  2. Palpitations - benign monitor. Likely related to her severe COPD with intermittent hypoxia, use of bronchodilators - since severe COPD stop coreg, start dilt 60m bid prn palpitations  3. Low bp - manual repeat 90/60. No severe symptoms. She reports recent cold, started on abx with resolution of fevers, chills, and improving cough - follow trends as infection improves, continue hydration given recent febrile  illness   F/u as needed    JArnoldo Lenis M.D.

## 2018-09-26 DIAGNOSIS — Z029 Encounter for administrative examinations, unspecified: Secondary | ICD-10-CM

## 2018-09-29 ENCOUNTER — Other Ambulatory Visit: Payer: Self-pay | Admitting: Physician Assistant

## 2018-09-29 DIAGNOSIS — F331 Major depressive disorder, recurrent, moderate: Secondary | ICD-10-CM

## 2018-10-04 ENCOUNTER — Other Ambulatory Visit: Payer: Self-pay | Admitting: Physician Assistant

## 2018-10-04 DIAGNOSIS — G2581 Restless legs syndrome: Secondary | ICD-10-CM

## 2018-10-19 ENCOUNTER — Other Ambulatory Visit: Payer: Self-pay | Admitting: Physician Assistant

## 2018-10-19 DIAGNOSIS — F411 Generalized anxiety disorder: Secondary | ICD-10-CM

## 2018-10-25 ENCOUNTER — Other Ambulatory Visit: Payer: Self-pay | Admitting: Physician Assistant

## 2018-10-25 DIAGNOSIS — G2581 Restless legs syndrome: Secondary | ICD-10-CM

## 2018-10-25 DIAGNOSIS — F331 Major depressive disorder, recurrent, moderate: Secondary | ICD-10-CM

## 2018-10-27 NOTE — Telephone Encounter (Signed)
Last seen 09/23/18

## 2018-10-31 ENCOUNTER — Telehealth: Payer: Self-pay | Admitting: Pulmonary Disease

## 2018-10-31 NOTE — Telephone Encounter (Signed)
Cancelled [email or web address] and printed and faxed order to [facility]

## 2018-11-03 ENCOUNTER — Telehealth: Payer: Self-pay | Admitting: Pulmonary Disease

## 2018-11-03 NOTE — Telephone Encounter (Signed)
Called and spoke with Alicia Sanchez. Advised her that we have sent the order and fax. Nothing further needed.

## 2018-11-03 NOTE — Telephone Encounter (Signed)
Per phone note dated 10/31/18:  Note    Cancelled [email or web address] and printed and faxed order to [facility]

## 2018-11-03 NOTE — Telephone Encounter (Signed)
PCCs, can you help us out with this please? Thank you.

## 2018-11-03 NOTE — Telephone Encounter (Signed)
This was done on Friday see 10/31/18 phone note Almyra FreeLibby has already faxed this.

## 2018-11-05 ENCOUNTER — Telehealth: Payer: Self-pay | Admitting: Pulmonary Disease

## 2018-11-05 NOTE — Telephone Encounter (Signed)
Case #: [id number] and say Elisha HeadlandBrian Mack, NP for the provider is the information that needs to be stated when calling in regards to pt with [employer].  [employer] and spoke with Tamika stating to her that we had [facility] the [facility] fax number of [phone].    Tam[facility] decided to go ahead and [facility] while I was on the phone and after getting back on the phone with me, she stated that [facility]. Nothing further needed.

## 2018-11-20 ENCOUNTER — Telehealth: Payer: Self-pay | Admitting: Pulmonary Disease

## 2018-11-20 DIAGNOSIS — Z720 Tobacco use: Secondary | ICD-10-CM

## 2018-11-20 NOTE — Telephone Encounter (Signed)
11/20/2018 1633  Received CT outpatient imaging results from [facility].  CT without contrast performed on 11/20/2018.  11/20/2018-CT without contrast- see no concerning pulmonary nodules several 1 to 3 mm calcified and noncalcified nodules likely relate benign disease, if the patient is eligible consider participation in lung cancer screening program which utilizes annual low-dose CT imaging of chest, moderate changes of centrilobular emphysema and diffuse mild bronchial wall thickening.  Plan:  >>>We will refer patient back to lung cancer screening program at our office.  Will need to start screening back most likely in spring/2021. >>> I have placed these results in our box to be scanned to be added to the patient's chart.  Can we contact the patient let them know though I have placed a referral back to her lung cancer screening program after reviewing CT results from 11/20/2018.  We will refer you today to her lung cancer screening program >>>This is based off of your 35 pack-year smoking history >>> This is a recommendation from the [place] preventative services task force (USPSTF) >>>The USPSTF recommends annual screening for lung cancer with low-dose computed tomography (LDCT) in adults aged 59 to 80 years who have a 30 pack-year smoking history and currently smoke or have quit within the past 59 years. Screening should be discontinued once a person has not smoked for 15 years or develops a health problem that substantially limits life expectancy or the ability or willingness to have curative lung surgery.   Our office will call you and set up an appointment with Kandice Robinsons (Nurse Practitioner) who leads this program.  This appointment takes place in our office.  After completing this meeting with Kandice Robinsons NP you will get a low-dose CT as the screening >>>We will call you with those results    Will route to Kandice Robinsons NP as Cleda Clarks FNP

## 2018-11-20 NOTE — Telephone Encounter (Signed)
lmtcb x1 for pt. 

## 2018-11-21 ENCOUNTER — Ambulatory Visit (HOSPITAL_COMMUNITY): Payer: 59

## 2018-11-21 ENCOUNTER — Other Ambulatory Visit: Payer: Self-pay | Admitting: Acute Care

## 2018-11-21 DIAGNOSIS — Z122 Encounter for screening for malignant neoplasm of respiratory organs: Secondary | ICD-10-CM

## 2018-11-21 DIAGNOSIS — F17210 Nicotine dependence, cigarettes, uncomplicated: Secondary | ICD-10-CM

## 2018-11-24 ENCOUNTER — Encounter: Payer: Self-pay | Admitting: Physician Assistant

## 2018-11-24 ENCOUNTER — Ambulatory Visit (INDEPENDENT_AMBULATORY_CARE_PROVIDER_SITE_OTHER): Payer: PRIVATE HEALTH INSURANCE | Admitting: Physician Assistant

## 2018-11-24 VITALS — BP 114/76 | HR 93 | Temp 98.2°F | Ht 64.0 in | Wt 173.0 lb

## 2018-11-24 DIAGNOSIS — R300 Dysuria: Secondary | ICD-10-CM | POA: Diagnosis not present

## 2018-11-24 DIAGNOSIS — G8929 Other chronic pain: Secondary | ICD-10-CM | POA: Insufficient documentation

## 2018-11-24 DIAGNOSIS — F331 Major depressive disorder, recurrent, moderate: Secondary | ICD-10-CM | POA: Diagnosis not present

## 2018-11-24 DIAGNOSIS — M1990 Unspecified osteoarthritis, unspecified site: Secondary | ICD-10-CM

## 2018-11-24 DIAGNOSIS — Z1159 Encounter for screening for other viral diseases: Secondary | ICD-10-CM | POA: Diagnosis not present

## 2018-11-24 DIAGNOSIS — M25512 Pain in left shoulder: Secondary | ICD-10-CM

## 2018-11-24 LAB — URINALYSIS, COMPLETE
Bilirubin, UA: NEGATIVE
Glucose, UA: NEGATIVE
Ketones, UA: NEGATIVE
Nitrite, UA: NEGATIVE
Protein, UA: NEGATIVE
Specific Gravity, UA: <1.005 — ABNORMAL LOW (ref 1.005–1.030)
Urobilinogen, Ur: 0.2 mg/dL (ref 0.2–1.0)
pH, UA: 6 (ref 5.0–7.5)

## 2018-11-24 LAB — MICROSCOPIC EXAMINATION: Renal Epithel, UA: NONE SEEN /HPF

## 2018-11-24 MED ORDER — DULOXETINE HCL 60 MG PO CPEP: 120.0000 mg | ORAL_CAPSULE | Freq: Every day | ORAL | 3 refills | Status: DC

## 2018-11-24 MED ORDER — DOXEPIN HCL 25 MG PO CAPS: 25.0000 mg | ORAL_CAPSULE | Freq: Every day | ORAL | 3 refills | Status: DC

## 2018-11-24 MED ORDER — MELOXICAM 7.5 MG PO TABS: 7.5000 mg | ORAL_TABLET | Freq: Every day | ORAL | 3 refills | Status: DC

## 2018-11-24 MED ORDER — SULFAMETHOXAZOLE-TRIMETHOPRIM 800-160 MG PO TABS: 1.0000 | ORAL_TABLET | Freq: Two times a day (BID) | ORAL | 0 refills | Status: DC

## 2018-11-24 NOTE — Telephone Encounter (Signed)
Alicia Sanchez, please make sure patient is scheduled for follow up LDCT 11/2019. She has already had a SDMV, so she just needs the scan. Thanks so much!!

## 2018-11-25 LAB — HEPATITIS C ANTIBODY: Hep C Virus Ab: <0.1 s/co ratio (ref 0.0–0.9)

## 2018-11-25 NOTE — Telephone Encounter (Signed)
Order placed for Low Dose Chest Ct for 11/2019.  Nothing further needed at this time.

## 2018-11-26 LAB — URINE CULTURE

## 2018-11-26 NOTE — Telephone Encounter (Signed)
lmtcb for pt to make her aware of referral.

## 2018-11-27 ENCOUNTER — Ambulatory Visit (INDEPENDENT_AMBULATORY_CARE_PROVIDER_SITE_OTHER): Payer: 59 | Admitting: Orthopaedic Surgery

## 2018-11-27 NOTE — Progress Notes (Signed)
BP 114/76   Pulse 93   Temp 98.2 F (36.8 C) (Oral)   Ht _0  (1.626 m)   Wt 173 lb (78.5 kg)   BMI 29.70 kg/m    Subjective:    Patient ID: Alicia Sanchez, female    DOB: 1961/02/12, 58 y.o.   MRN: [id number]  HPI: Alicia Sanchez is a 58 y.o. female presenting on 11/24/2018 for COPD (2 month follow up )  This patient comes in for periodic recheck on her chronic medical conditions.  Alicia Sanchez has had left shoulder pain that is been chronic for many many months.  Alicia Sanchez would like to go ahead and have orthopedic referral for this.  Think this is appropriate for that we will try to make an appointment in [facility] for her.  Alicia Sanchez is still seeing her pulmonologist and has been awarded disability.  Alicia Sanchez is currently under her insurance but knows that Alicia Sanchez will not have Medicare for couple of more years.  We have discussed her chronic health preventative things we are going to order Cologuard and schedule mammogram.  Alicia Sanchez is seeing a gynecologist for her Pap.  Alicia Sanchez will be updated on tetanus.  He states that Alicia Sanchez did have a hysterectomy many years ago due to uterine prolapse.  Past Medical History:  Diagnosis Date  . Anxiety   . COPD (chronic obstructive pulmonary disease) (HCC)    Relevant past medical, surgical, family and social history reviewed and updated as indicated. Interim medical history since our last visit reviewed. Allergies and medications reviewed and updated. DATA REVIEWED: CHART IN EPIC  Family History reviewed for pertinent findings.  Review of Systems  Constitutional: Negative.   HENT: Negative.   Eyes: Negative.   Respiratory: Positive for cough, shortness of breath and wheezing.   Gastrointestinal: Negative.   Genitourinary: Positive for dysuria and frequency.  Musculoskeletal: Positive for arthralgias, back pain and joint swelling.    Allergies as of 11/24/2018   No Known Allergies     Medication List       Accurate as of November 24, 2018 11:59 PM. Always use your most  recent med list.        albuterol 108 (90 Base) MCG/ACT inhaler Commonly known as:  PROVENTIL HFA;VENTOLIN HFA Inhale 2 puffs into the lungs every 6 (six) hours as needed for wheezing or shortness of breath.   ALPRAZolam 0.5 MG tablet Commonly known as:  XANAX TAKE 1 TABLET BY MOUTH TWICE DAILY AS NEEDED FOR ANXIETY   baclofen 10 MG tablet Commonly known as:  LIORESAL TAKE 1 TABLET BY MOUTH TWICE DAILY   busPIRone 10 MG tablet Commonly known as:  BUSPAR TAKE 1 TABLET BY MOUTH THREE TIMES DAILY   diltiazem 30 MG tablet Commonly known as:  CARDIZEM Take 1 tablet (30 mg total) by mouth 2 (two) times daily as needed.   doxepin 25 MG capsule Commonly known as:  SINEQUAN Take 1-2 capsules (25-50 mg total) by mouth at bedtime.   DULoxetine 60 MG capsule Commonly known as:  CYMBALTA Take 2 capsules (120 mg total) by mouth daily.   Fluticasone-Umeclidin-Vilant 100-62.5-25 MCG/INH Aepb Commonly known as:  TRELEGY ELLIPTA Inhale 1 puff into the lungs daily.   ipratropium-albuterol 0.5-2.5 (3) MG/3ML Soln Commonly known as:  DUONEB Take 3 mLs every 6 (six) hours as needed by nebulization.   meclizine 25 MG tablet Commonly known as:  ANTIVERT TAKE 1 TABLET BY MOUTH THREE TIMES DAILY AS NEEDED FOR  DIZZINESS   meloxicam  7.5 MG tablet Commonly known as:  MOBIC Take 1 tablet (7.5 mg total) by mouth daily.   multivitamin with minerals Tabs tablet Take 1 tablet by mouth daily.   Nebulizer/Tubing/Mouthpiece Kit 1 Units 4 (four) times daily by Does not apply route. GIVE face mask and tubing for nebulizer   nicotine 21 mg/24hr patch Commonly known as:  NICODERM CQ - dosed in mg/24 hours Place 1 patch (21 mg total) onto the skin daily.   oxybutynin 10 MG 24 hr tablet Commonly known as:  DITROPAN-XL TAKE 1 TABLET BY MOUTH AT BEDTIME FOR  OVERACTIVE  BLADDER   rOPINIRole 1 MG tablet Commonly known as:  REQUIP TAKE 1 TO 4 TABLETS BY MOUTH AT BEDTIME   rOPINIRole 0.5 MG  tablet Commonly known as:  REQUIP TAKE 1 TO 2 TABLETS BY MOUTH AT BEDTIME   sulfamethoxazole-trimethoprim 800-160 MG tablet Commonly known as:  BACTRIM DS Take 1 tablet by mouth 2 (two) times daily.          Objective:    BP 114/76   Pulse 93   Temp 98.2 F (36.8 C) (Oral)   Ht _0  (1.626 m)   Wt 173 lb (78.5 kg)   BMI 29.70 kg/m   No Known Allergies  Wt Readings from Last 3 Encounters:  11/24/18 173 lb (78.5 kg)  09/23/18 174 lb (78.9 kg)  09/23/18 172 lb 9.6 oz (78.3 kg)    Physical Exam Constitutional:      Appearance: Alicia Sanchez is well-developed.  HENT:     Head: Normocephalic and atraumatic.  Eyes:     Conjunctiva/sclera: Conjunctivae normal.     Pupils: Pupils are equal, round, and reactive to light.  Cardiovascular:     Rate and Rhythm: Normal rate and regular rhythm.     Heart sounds: Normal heart sounds.  Pulmonary:     Effort: Pulmonary effort is normal.     Breath sounds: Normal breath sounds.  Abdominal:     General: Bowel sounds are normal.     Palpations: Abdomen is soft.  Musculoskeletal:     Left shoulder: Alicia Sanchez exhibits decreased range of motion and tenderness.  Skin:    General: Skin is warm and dry.     Findings: No rash.  Neurological:     Mental Status: Alicia Sanchez is alert and oriented to person, place, and time.     Deep Tendon Reflexes: Reflexes are normal and symmetric.  Psychiatric:        Behavior: Behavior normal.        Thought Content: Thought content normal.        Judgment: Judgment normal.         Assessment & Plan:   1. Dysuria - Urine Culture - Urinalysis, Complete  2. Moderate episode of recurrent major depressive disorder (HCC) - DULoxetine (CYMBALTA) 60 MG capsule; Take 2 capsules (120 mg total) by mouth daily.  Dispense: 180 capsule; Refill: 3 - doxepin (SINEQUAN) 25 MG capsule; Take 1-2 capsules (25-50 mg total) by mouth at bedtime.  Dispense: 180 capsule; Refill: 3  3. Need for hepatitis C screening test - Hepatitis  C antibody  4. Chronic left shoulder pain - Ambulatory referral to Orthopedic Surgery  5. Arthritis Refer to ortho   Continue all other maintenance medications as listed above.  Follow up plan: Return in about 3 months (around 02/23/2019).  Educational handout given for [employer] PA-C [place] [address]  [place], [place] [address]  [phone]   11/27/2018, 8:26 PM

## 2018-11-28 ENCOUNTER — Other Ambulatory Visit: Payer: Self-pay | Admitting: Physician Assistant

## 2018-11-28 MED ORDER — NITROFURANTOIN MONOHYD MACRO 100 MG PO CAPS: 100.0000 mg | ORAL_CAPSULE | Freq: Two times a day (BID) | ORAL | 0 refills | Status: DC

## 2018-12-02 NOTE — Telephone Encounter (Signed)
Pt is aware of Brian's below message and voiced her understanding. Nothing further is needed.

## 2018-12-11 ENCOUNTER — Ambulatory Visit (INDEPENDENT_AMBULATORY_CARE_PROVIDER_SITE_OTHER): Payer: 59 | Admitting: Orthopaedic Surgery

## 2018-12-18 ENCOUNTER — Ambulatory Visit (INDEPENDENT_AMBULATORY_CARE_PROVIDER_SITE_OTHER): Payer: 59

## 2018-12-18 ENCOUNTER — Ambulatory Visit (INDEPENDENT_AMBULATORY_CARE_PROVIDER_SITE_OTHER): Payer: 59 | Admitting: Orthopaedic Surgery

## 2018-12-18 ENCOUNTER — Encounter (INDEPENDENT_AMBULATORY_CARE_PROVIDER_SITE_OTHER): Payer: Self-pay | Admitting: Orthopaedic Surgery

## 2018-12-18 VITALS — BP 92/65 | HR 92 | Ht 64.5 in | Wt 174.0 lb

## 2018-12-18 DIAGNOSIS — M4722 Other spondylosis with radiculopathy, cervical region: Secondary | ICD-10-CM | POA: Diagnosis not present

## 2018-12-18 DIAGNOSIS — M542 Cervicalgia: Secondary | ICD-10-CM | POA: Diagnosis not present

## 2018-12-18 NOTE — Progress Notes (Signed)
Office Visit Note   Patient: Alicia Sanchez           Date of Birth: 03-May-1961           MRN: [id number] Visit Date: 12/18/2018              Requested by: Remus Loffler, PA-C [address] [place], [place] [address] PCP: Remus Loffler, PA-C   Assessment & Plan: Visit Diagnoses:  1. Neck pain   2. Other spondylosis with radiculopathy, cervical region     Plan: We will place patient on prednisone 5 mg Dosepak number 21 tablets.  She will take with food recheck 5 weeks.  Follow-Up Instructions: Return in about 5 weeks (around 01/22/2019).   Orders:  Orders Placed This Encounter  Procedures  . XR Cervical Spine 2 or 3 views   No orders of the defined types were placed in this encounter.     Procedures: No procedures performed   Clinical Data: No additional findings.   Subjective: Chief Complaint  Patient presents with  . Left Arm - Pain  . Left Shoulder - Pain    HPI 58 year old female seen with left arm pain down to her shoulder that radiates into her hand.  She states the pain is severe she has some tingling both hands pain in the left arm she rates at 8 out of 10 she is not able to sleep.  Is been present for 2 months.  She is used over-the-counter medications anti-inflammatories, ice on her shoulder, pain patch without relief.  No bowel or bladder symptoms no chills or fever.  Review of Systems patient has COPD she still active smoker.  History of upper expiratory infections, restless leg.  COPD Gold 2, history of UTIs.  Positive for anxiety bladder problems bronchitis depression emphysema migraines otherwise 14 point review of systems negative.  Patient uses oxygen.  She still smokes 1/2 pack/day and drinks alcohol.   Objective: Vital Signs: BP 92/65   Pulse 92   Ht 5' 4.5" (1.638 m)   Wt 174 lb (78.9 kg)   BMI 29.41 kg/m   Physical Exam Constitutional:      Appearance: She is well-developed.  HENT:     Head: Normocephalic.     Right Ear: External ear  normal.     Left Ear: External ear normal.  Eyes:     Pupils: Pupils are equal, round, and reactive to light.  Neck:     Thyroid: No thyromegaly.     Trachea: No tracheal deviation.  Cardiovascular:     Rate and Rhythm: Normal rate.  Pulmonary:     Breath sounds: No rhonchi.     Comments: Mild increased respiratory effort. Abdominal:     Palpations: Abdomen is soft.  Skin:    General: Skin is warm and dry.  Neurological:     Mental Status: She is alert and oriented to person, place, and time.  Psychiatric:        Behavior: Behavior normal.     Ortho Exam patient has brachial plexus tenderness on the left.  No lower extremity gait disturbance.  No impingement of the shoulders.  No tenderness with percussion over the cubital tunnel.  Specialty Comments:  No specialty comments available.  Imaging: No results found.   PMFS History: Patient Active Problem List   Diagnosis Date Noted  . Chronic left shoulder pain 11/24/2018  . Arthritis 11/24/2018  . 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 Actinic keratosis  07/17/2018  . Chronic respiratory failure with hypoxia (HCC) 07/16/2018  . Abnormal findings on diagnostic imaging of lung 06/18/2018  . COPD (chronic obstructive pulmonary disease) (HCC) 05/22/2018  . Acute respiratory failure with hypoxia (HCC)   . Acute cystitis with hematuria 08/21/2017  . Lung nodules 08/21/2017  . GOLD COPD II D 08/21/2017  . Recurrent UTI 08/21/2017  . Generalized anxiety disorder 03/22/2017  . Moderate episode of recurrent major depressive disorder (HCC) 12/16/2016  . Restless legs 12/16/2016  . OAB (overactive bladder) 12/16/2016  . COPD exacerbation (HCC) 11/19/2015  . Acute URI 11/19/2015  . Leukocytosis 11/19/2015  . 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 Tobacco abuse 11/19/2015  . Depression with anxiety 11/19/2015   Past Medical History:  Diagnosis Date  . Anxiety   . COPD (chronic obstructive pulmonary disease) (HCC)       Family History  Problem Relation Age of Onset  . COPD Mother   . COPD Father   . Diabetes type II Sister   . Diabetes type II Brother   . COPD Maternal Grandfather     Past Surgical History:  Procedure Laterality Date  . ABDOMINAL HYSTERECTOMY     Social History   Occupational History  . Not on file  Tobacco Use  . Smoking status: Current Every Day Smoker    Packs/day: 0.50    Years: 42.00    Pack years: 21.00    Types: Cigarettes  . Smokeless tobacco: Never Used  . Tobacco comment: 1/2 pack per day 07/03/2018 or more 07/16/18  Substance and Sexual Activity  . Alcohol use: No  . Drug use: No  . Sexual activity: Yes    Birth control/protection: Surgical

## 2018-12-21 ENCOUNTER — Encounter (INDEPENDENT_AMBULATORY_CARE_PROVIDER_SITE_OTHER): Payer: Self-pay | Admitting: Orthopaedic Surgery

## 2018-12-21 DIAGNOSIS — M4722 Other spondylosis with radiculopathy, cervical region: Secondary | ICD-10-CM | POA: Insufficient documentation

## 2019-01-22 ENCOUNTER — Ambulatory Visit (INDEPENDENT_AMBULATORY_CARE_PROVIDER_SITE_OTHER): Payer: Self-pay | Admitting: Orthopaedic Surgery

## 2019-01-22 ENCOUNTER — Encounter (INDEPENDENT_AMBULATORY_CARE_PROVIDER_SITE_OTHER): Payer: Self-pay | Admitting: Orthopaedic Surgery

## 2019-01-22 VITALS — BP 99/65 | HR 76 | Ht 64.5 in | Wt 174.0 lb

## 2019-01-22 DIAGNOSIS — M4722 Other spondylosis with radiculopathy, cervical region: Secondary | ICD-10-CM

## 2019-01-22 DIAGNOSIS — M542 Cervicalgia: Secondary | ICD-10-CM

## 2019-01-22 MED ORDER — TRAMADOL HCL 50 MG PO TABS: 50.0000 mg | ORAL_TABLET | Freq: Two times a day (BID) | ORAL | 0 refills | Status: DC | PRN

## 2019-01-22 NOTE — Progress Notes (Signed)
Office Visit Note   Patient: Alicia Sanchez           Date of Birth: 11/30/1960           MRN: [id number] Visit Date: 01/22/2019              Requested by: Remus Loffler, PA-C [address] [place], [place] [address] PCP: Remus Loffler, PA-C   Assessment & Plan: Visit Diagnoses:  1. Neck pain   2. Other spondylosis with radiculopathy, cervical region     Plan: Patient continues to have severe neck and left arm pain she rates at 8 out of 10 she is not able to sleep is been present for greater than 2 months she is taken prednisone Dosepak anti-inflammatories without relief.  Ultram prescribed for pain today.  Spondylitic changes noted on plain radiograph C5-6 C6-7.  She is failed conservative treatment with medication and symptoms now present for 3 months.  We will proceed with MRI scan cervical spine office follow-up after scan for review.  Follow-Up Instructions: No follow-ups on file.   Orders:  Orders Placed This Encounter  Procedures  . MR Cervical Spine w/o contrast   Meds ordered this encounter  Medications  . traMADol (ULTRAM) 50 MG tablet    Sig: Take 1 tablet (50 mg total) by mouth 2 (two) times daily as needed.    Dispense:  20 tablet    Refill:  0      Procedures: No procedures performed   Clinical Data: No additional findings.   Subjective: Chief Complaint  Patient presents with  . Neck - Pain, Follow-up    HPI 58 year old female returns with persistent problems with significant neck pain and left hand numbness and states now her right hand is getting progressively knob and she is also noticed numbness and tingling in both legs.  She states her pain is fairly severe at a 10 she is on portable oxygen states she is got a current lung infection is being treated for this currently.  No chills or fever other than for the pulmonary problems she had.  Review of Systems 14 point review of system positive for COPD she still smokes half pack per day positive for  history of URI.  Restless leg COPD Gold 2 history UTIs.  Positive for bladder problems bronchitis depression anxiety emphysema migraines otherwise negative.   Objective: Vital Signs: BP 99/65   Pulse 76   Ht 5' 4.5" (1.638 m)   Wt 174 lb (78.9 kg)   BMI 29.41 kg/m   Physical Exam Constitutional:      Appearance: She is well-developed.  HENT:     Head: Normocephalic.     Right Ear: External ear normal.     Left Ear: External ear normal.  Eyes:     Pupils: Pupils are equal, round, and reactive to light.  Neck:     Thyroid: No thyromegaly.     Trachea: No tracheal deviation.  Cardiovascular:     Rate and Rhythm: Normal rate.  Pulmonary:     Effort: Pulmonary effort is normal.  Abdominal:     Palpations: Abdomen is soft.  Skin:    General: Skin is warm and dry.  Neurological:     Mental Status: She is alert and oriented to person, place, and time.  Psychiatric:        Behavior: Behavior normal.     Ortho Exam patient has brachial plexus tenderness on the left persistent.  Numbness in the  hand on the radial aspect of the hand.  Patient has brachial plexus now on the right side.  Increased pain with compression minimal improvement with distraction.  Cubital tunnel carpal tunnel exam is normal both right and left.  Upper extremity reflexes are 2+ no lower extremity clonus no lower extremity hyperreflexia negative Babinski.  Good strength lower extremity.  Biceps triceps wrist flexion is strong.  Specialty Comments:  No specialty comments available.  Imaging: No results found.   PMFS History: Patient Active Problem List   Diagnosis Date Noted  . Other spondylosis with radiculopathy, cervical region 12/21/2018  . Chronic left shoulder pain 11/24/2018  . Arthritis 11/24/2018  . 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 Actinic keratosis 07/17/2018  . Chronic respiratory failure with hypoxia (HCC) 07/16/2018  . Abnormal findings on diagnostic imaging of  lung 06/18/2018  . COPD (chronic obstructive pulmonary disease) (HCC) 05/22/2018  . Acute respiratory failure with hypoxia (HCC)   . Acute cystitis with hematuria 08/21/2017  . Lung nodules 08/21/2017  . GOLD COPD II D 08/21/2017  . Recurrent UTI 08/21/2017  . Generalized anxiety disorder 03/22/2017  . Moderate episode of recurrent major depressive disorder (HCC) 12/16/2016  . Restless legs 12/16/2016  . OAB (overactive bladder) 12/16/2016  . COPD exacerbation (HCC) 11/19/2015  . Acute URI 11/19/2015  . Leukocytosis 11/19/2015  . 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 Tobacco abuse 11/19/2015  . Depression with anxiety 11/19/2015   Past Medical History:  Diagnosis Date  . Anxiety   . COPD (chronic obstructive pulmonary disease) (HCC)     Family History  Problem Relation Age of Onset  . COPD Mother   . COPD Father   . Diabetes type II Sister   . Diabetes type II Brother   . COPD Maternal Grandfather     Past Surgical History:  Procedure Laterality Date  . ABDOMINAL HYSTERECTOMY     Social History   Occupational History  . Not on file  Tobacco Use  . Smoking status: Current Every Day Smoker    Packs/day: 0.50    Years: 42.00    Pack years: 21.00    Types: Cigarettes  . Smokeless tobacco: Never Used  . Tobacco comment: 1/2 pack per day 07/03/2018 or more 07/16/18  Substance and Sexual Activity  . Alcohol use: No  . Drug use: No  . Sexual activity: Yes    Birth control/protection: Surgical

## 2019-01-24 ENCOUNTER — Encounter (INDEPENDENT_AMBULATORY_CARE_PROVIDER_SITE_OTHER): Payer: Self-pay | Admitting: Orthopaedic Surgery

## 2019-02-01 ENCOUNTER — Other Ambulatory Visit: Payer: Self-pay | Admitting: Physician Assistant

## 2019-02-01 DIAGNOSIS — F411 Generalized anxiety disorder: Secondary | ICD-10-CM

## 2019-02-02 ENCOUNTER — Other Ambulatory Visit: Payer: Self-pay | Admitting: Physician Assistant

## 2019-02-02 ENCOUNTER — Telehealth: Payer: Self-pay | Admitting: Physician Assistant

## 2019-02-02 DIAGNOSIS — F411 Generalized anxiety disorder: Secondary | ICD-10-CM

## 2019-02-02 MED ORDER — ALPRAZOLAM 0.5 MG PO TABS: 0.5000 mg | ORAL_TABLET | Freq: Two times a day (BID) | ORAL | 1 refills | Status: DC | PRN

## 2019-02-02 NOTE — Telephone Encounter (Signed)
Pt has an apt on 4/6 with AJ and states that she had one refill left on Xanax at pharmacy and it was day late so it was denied? Wants to talk to nurse about it.

## 2019-02-02 NOTE — Telephone Encounter (Signed)
Last seen 11/24/2018

## 2019-02-02 NOTE — Telephone Encounter (Signed)
Sent a refill in.

## 2019-02-03 NOTE — Telephone Encounter (Signed)
Patient aware.

## 2019-02-05 ENCOUNTER — Encounter (INDEPENDENT_AMBULATORY_CARE_PROVIDER_SITE_OTHER): Payer: Self-pay | Admitting: Orthopaedic Surgery

## 2019-02-05 ENCOUNTER — Ambulatory Visit: Payer: PRIVATE HEALTH INSURANCE | Admitting: Physician Assistant

## 2019-02-05 ENCOUNTER — Other Ambulatory Visit: Payer: Self-pay | Admitting: Physician Assistant

## 2019-02-05 ENCOUNTER — Telehealth: Payer: Self-pay | Admitting: Physician Assistant

## 2019-02-05 ENCOUNTER — Other Ambulatory Visit: Payer: Self-pay

## 2019-02-05 ENCOUNTER — Ambulatory Visit (INDEPENDENT_AMBULATORY_CARE_PROVIDER_SITE_OTHER): Payer: PRIVATE HEALTH INSURANCE | Admitting: Orthopaedic Surgery

## 2019-02-05 VITALS — Ht 64.5 in | Wt 174.0 lb

## 2019-02-05 DIAGNOSIS — M4722 Other spondylosis with radiculopathy, cervical region: Secondary | ICD-10-CM | POA: Diagnosis not present

## 2019-02-05 DIAGNOSIS — M4802 Spinal stenosis, cervical region: Secondary | ICD-10-CM

## 2019-02-05 DIAGNOSIS — Z720 Tobacco use: Secondary | ICD-10-CM

## 2019-02-05 MED ORDER — CEFDINIR 300 MG PO CAPS: 300.0000 mg | ORAL_CAPSULE | Freq: Two times a day (BID) | ORAL | 0 refills | Status: DC

## 2019-02-05 MED ORDER — PREDNISONE 10 MG (48) PO TBPK: ORAL_TABLET | ORAL | 0 refills | Status: DC

## 2019-02-05 NOTE — Progress Notes (Signed)
Office Visit Note   Patient: Alicia Sanchez           Date of Birth: 1961-01-26           MRN: [id number] Visit Date: 02/05/2019              Requested by: Remus Loffler, PA-C [address] [place], [place] [address] PCP: Remus Loffler, PA-C   Assessment & Plan: Visit Diagnoses:  1. Other spondylosis with radiculopathy, cervical region   2. Foraminal stenosis of cervical region     Plan: Patient is having severe pain.  She has severe foraminal stenosis on the left consistent with her symptoms.  She has been through ice and anti-inflammatories, pain patch, topical cream, prednisone pack all without relief.  She states her pain is severe.  Plan would be single level cervical fusion at C5-6.  She need pulmonary clearance preoperatively for maximization of her pulmonary status and may need new pulmonary function test.  We discussed current elective surgery was closed due to Covid -19 virus for several weeks.  Once elective surgery restarts she can call about scheduling.  She can talk with her pulmonary doctor about getting appropriate preoperative clearance and testing is if indicated.  MRI was reviewed with patient and her husband today. Copy of the report given.   Follow-Up Instructions: No follow-ups on file.   Orders:  No orders of the defined types were placed in this encounter.  No orders of the defined types were placed in this encounter.     Procedures: No procedures performed   Clinical Data: No additional findings.   Subjective: Chief Complaint  Patient presents with  . Neck - Pain, Follow-up    MRI cervical spine review    HPI 58 year old female returns with persistent neck pain left hand numbness states she is also having some right hand numbness.  She had cervical MRI scan which is available for review.  She still on portable oxygen.  No cough no chills or fever currently.  Review of Systems Positive COPD she still smokes half pack per day positive prepped else  per respiratory infection in February.  Restless leg COPD Gold 2.  History of UTIs.  Possible bronchitis depression anxiety, emphysema, migraines, bladder problems  Objective: Vital Signs: Ht 5' 4.5" (1.638 m)   Wt 174 lb (78.9 kg)   BMI 29.41 kg/m   Physical Exam Constitutional:      Appearance: She is well-developed.  HENT:     Head: Normocephalic.     Right Ear: External ear normal.     Left Ear: External ear normal.  Eyes:     Pupils: Pupils are equal, round, and reactive to light.  Neck:     Thyroid: No thyromegaly.     Trachea: No tracheal deviation.  Cardiovascular:     Rate and Rhythm: Normal rate.  Pulmonary:     Effort: Pulmonary effort is normal.  Abdominal:     Palpations: Abdomen is soft.  Skin:    General: Skin is warm and dry.  Neurological:     Mental Status: She is alert and oriented to person, place, and time.  Psychiatric:        Behavior: Behavior normal.     Ortho Exam patient has positive brachial plexus tenderness on the left minimal on the right positive Spurling increased pain with compression some relief with distraction.Upper extremity reflexes are 2+ and symmetrical 2+ knee and ankle jerk no clonus.  Negative shoulder impingement  right and left.  Some decreased sensation over the left C6 distribution dermatome down to the radial side of the hand.  Specialty Comments:  No specialty comments available.  Imaging: MRI scan 01/29/2019 shows some degenerative changes in cervical spine without acute osseous process.  Mild canal narrowing at C4-5 and C5-6.  There is moderate to severe left neural foraminal stenosis at C5-6.  There is moderate right foraminal stenosis at C5-6 as well.   PMFS History: Patient Active Problem List   Diagnosis Date Noted  . Other spondylosis with radiculopathy, cervical region 12/21/2018  . Chronic left shoulder pain 11/24/2018  . Arthritis 11/24/2018  . 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 Actinic  keratosis 07/17/2018  . Chronic respiratory failure with hypoxia (HCC) 07/16/2018  . Abnormal findings on diagnostic imaging of lung 06/18/2018  . COPD (chronic obstructive pulmonary disease) (HCC) 05/22/2018  . Acute respiratory failure with hypoxia (HCC)   . Acute cystitis with hematuria 08/21/2017  . Lung nodules 08/21/2017  . GOLD COPD II D 08/21/2017  . Recurrent UTI 08/21/2017  . Generalized anxiety disorder 03/22/2017  . Moderate episode of recurrent major depressive disorder (HCC) 12/16/2016  . Restless legs 12/16/2016  . OAB (overactive bladder) 12/16/2016  . COPD exacerbation (HCC) 11/19/2015  . Acute URI 11/19/2015  . Leukocytosis 11/19/2015  . 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 Tobacco abuse 11/19/2015  . Depression with anxiety 11/19/2015   Past Medical History:  Diagnosis Date  . Anxiety   . COPD (chronic obstructive pulmonary disease) (HCC)     Family History  Problem Relation Age of Onset  . COPD Mother   . COPD Father   . Diabetes type II Sister   . Diabetes type II Brother   . COPD Maternal Grandfather     Past Surgical History:  Procedure Laterality Date  . ABDOMINAL HYSTERECTOMY     Social History   Occupational History  . Not on file  Tobacco Use  . Smoking status: Current Every Day Smoker    Packs/day: 0.50    Years: 42.00    Pack years: 21.00    Types: Cigarettes  . Smokeless tobacco: Never Used  . Tobacco comment: 1/2 pack per day 07/03/2018 or more 07/16/18  Substance and Sexual Activity  . Alcohol use: No  . Drug use: No  . Sexual activity: Yes    Birth control/protection: Surgical

## 2019-02-05 NOTE — Progress Notes (Signed)
I have done a telephone encounter with this patient.  She does have known COPD.  She had to be off of her trilogy for about a week because it had to be ordered and she could not afford it to begin with.  We are looking for samples for her.  She has not had an exacerbation in a while.  She did have a temperature 100.1.  She has had productive cough that is green and yellow at times.  She denies any blood.  Assessment and plan:  Sterapred 10 mg 12-day pack Omnicef 300 mg 1 twice daily for 10 days All of these are sent to [facility] in Cristela Felt PA-C [facility] [address]  [place], [place] [address] [phone]

## 2019-02-05 NOTE — Telephone Encounter (Signed)
Order note completed

## 2019-02-06 ENCOUNTER — Encounter (INDEPENDENT_AMBULATORY_CARE_PROVIDER_SITE_OTHER): Payer: Self-pay | Admitting: Orthopaedic Surgery

## 2019-02-06 DIAGNOSIS — M4802 Spinal stenosis, cervical region: Secondary | ICD-10-CM | POA: Insufficient documentation

## 2019-02-23 ENCOUNTER — Ambulatory Visit: Payer: 59 | Admitting: Physician Assistant

## 2019-02-24 ENCOUNTER — Ambulatory Visit: Payer: PRIVATE HEALTH INSURANCE | Admitting: Physician Assistant

## 2019-03-03 ENCOUNTER — Ambulatory Visit (INDEPENDENT_AMBULATORY_CARE_PROVIDER_SITE_OTHER): Payer: PRIVATE HEALTH INSURANCE | Admitting: Physician Assistant

## 2019-03-03 ENCOUNTER — Other Ambulatory Visit: Payer: Self-pay

## 2019-03-03 ENCOUNTER — Encounter: Payer: Self-pay | Admitting: Physician Assistant

## 2019-03-03 DIAGNOSIS — J42 Unspecified chronic bronchitis: Secondary | ICD-10-CM

## 2019-03-03 NOTE — Progress Notes (Signed)
Was unable to reach patient at the following number [phone].  Attempted calls at 3:28 PM, 3:30 PM and 3:40 PM

## 2019-03-06 ENCOUNTER — Other Ambulatory Visit: Payer: Self-pay | Admitting: Physician Assistant

## 2019-03-06 DIAGNOSIS — F411 Generalized anxiety disorder: Secondary | ICD-10-CM

## 2019-03-11 ENCOUNTER — Other Ambulatory Visit: Payer: Self-pay

## 2019-03-11 ENCOUNTER — Ambulatory Visit (INDEPENDENT_AMBULATORY_CARE_PROVIDER_SITE_OTHER): Payer: Medicaid Other | Admitting: Physician Assistant

## 2019-03-11 ENCOUNTER — Encounter: Payer: Self-pay | Admitting: Physician Assistant

## 2019-03-11 DIAGNOSIS — Z0000 Encounter for general adult medical examination without abnormal findings: Secondary | ICD-10-CM

## 2019-03-11 DIAGNOSIS — J441 Chronic obstructive pulmonary disease with (acute) exacerbation: Secondary | ICD-10-CM

## 2019-03-11 MED ORDER — PREDNISONE 10 MG (48) PO TBPK: ORAL_TABLET | ORAL | 0 refills | Status: DC

## 2019-03-11 MED ORDER — DOXYCYCLINE HYCLATE 100 MG PO TABS: 100.0000 mg | ORAL_TABLET | Freq: Two times a day (BID) | ORAL | 0 refills | Status: DC

## 2019-03-11 NOTE — Progress Notes (Signed)
Telephone visit  Subjective: CC: Bronchitis PCP: Terald Sleeper, PA-C Alicia Sanchez is a 58 y.o. female calls for telephone consult today. Patient provides verbal consent for consult held via phone.  Patient is identified with 2 separate identifiers.  At this time the entire area is on COVID-19 social distancing and stay home orders are in place.  Patient is of higher risk and therefore we are performing this by a virtual method.  Location of provider: Home Location of patient: Home Others present for call: No  Three days after the last antibiotic for bronchitis, flared back up of her bronchitis. Low grade fever, about 100, last 2 days Cough throughout the day and very bad at night, it is [pproductive. Some white, and then some red and green. Denies blood.  She has known COPD and recurrent bronchitis.  She does feel like she is in a bad exacerbation at this point.    ROS: Per HPI  No Known Allergies Past Medical History:  Diagnosis Date  . Anxiety   . COPD (chronic obstructive pulmonary disease) (HCC)     Current Outpatient Medications:  .  albuterol (PROVENTIL HFA;VENTOLIN HFA) 108 (90 Base) MCG/ACT inhaler, Inhale 2 puffs into the lungs every 6 (six) hours as needed for wheezing or shortness of breath., Disp: 1 Inhaler, Rfl: 6 .  ALPRAZolam (XANAX) 0.5 MG tablet, Take 1 tablet (0.5 mg total) by mouth 2 (two) times daily as needed. for anxiety, Disp: 60 tablet, Rfl: 1 .  baclofen (LIORESAL) 10 MG tablet, TAKE 1 TABLET BY MOUTH TWICE DAILY, Disp: 40 tablet, Rfl: 2 .  busPIRone (BUSPAR) 10 MG tablet, TAKE 1 TABLET BY MOUTH THREE TIMES DAILY, Disp: 90 tablet, Rfl: 0 .  cefdinir (OMNICEF) 300 MG capsule, Take 1 capsule (300 mg total) by mouth 2 (two) times daily. 1 po BID, Disp: 20 capsule, Rfl: 0 .  diltiazem (CARDIZEM) 30 MG tablet, Take 1 tablet (30 mg total) by mouth 2 (two) times daily as needed., Disp: 180 tablet, Rfl: 1 .  doxepin (SINEQUAN) 25 MG capsule, Take 1-2  capsules (25-50 mg total) by mouth at bedtime., Disp: 180 capsule, Rfl: 3 .  DULoxetine (CYMBALTA) 60 MG capsule, Take 2 capsules (120 mg total) by mouth daily., Disp: 180 capsule, Rfl: 3 .  Fluticasone-Umeclidin-Vilant (TRELEGY ELLIPTA) 100-62.5-25 MCG/INH AEPB, Inhale 1 puff into the lungs daily., Disp: 1 each, Rfl: 0 .  ipratropium-albuterol (DUONEB) 0.5-2.5 (3) MG/3ML SOLN, Take 3 mLs every 6 (six) hours as needed by nebulization., Disp: 360 mL, Rfl: 3 .  meclizine (ANTIVERT) 25 MG tablet, TAKE 1 TABLET BY MOUTH THREE TIMES DAILY AS NEEDED FOR DIZZINESS, Disp: 60 tablet, Rfl: 0 .  meloxicam (MOBIC) 7.5 MG tablet, Take 1 tablet (7.5 mg total) by mouth daily., Disp: 30 tablet, Rfl: 3 .  Multiple Vitamin (MULTIVITAMIN WITH MINERALS) TABS tablet, Take 1 tablet by mouth daily., Disp: , Rfl:  .  nicotine (NICODERM CQ - DOSED IN MG/24 HOURS) 21 mg/24hr patch, Place 1 patch (21 mg total) onto the skin daily., Disp: 28 patch, Rfl: 2 .  nitrofurantoin, macrocrystal-monohydrate, (MACROBID) 100 MG capsule, Take 1 capsule (100 mg total) by mouth 2 (two) times daily. 1 po BId, Disp: 20 capsule, Rfl: 0 .  oxybutynin (DITROPAN-XL) 10 MG 24 hr tablet, TAKE 1 TABLET BY MOUTH AT BEDTIME FOR  OVERACTIVE  BLADDER, Disp: 90 tablet, Rfl: 3 .  predniSONE (STERAPRED UNI-PAK 48 TAB) 10 MG (48) TBPK tablet, Take as directed for 12 days,  Disp: 48 tablet, Rfl: 0 .  Respiratory Therapy Supplies (NEBULIZER/TUBING/MOUTHPIECE) KIT, 1 Units 4 (four) times daily by Does not apply route. GIVE face mask and tubing for nebulizer, Disp: 1 each, Rfl: 11 .  rOPINIRole (REQUIP) 0.5 MG tablet, TAKE 1 TO 2 TABLETS BY MOUTH AT BEDTIME, Disp: 180 tablet, Rfl: 0 .  rOPINIRole (REQUIP) 1 MG tablet, TAKE 1 TO 4 TABLETS BY MOUTH AT BEDTIME, Disp: 360 tablet, Rfl: 3 .  sulfamethoxazole-trimethoprim (BACTRIM DS) 800-160 MG tablet, Take 1 tablet by mouth 2 (two) times daily., Disp: 6 tablet, Rfl: 0 .  traMADol (ULTRAM) 50 MG tablet, Take 1 tablet  (50 mg total) by mouth 2 (two) times daily as needed., Disp: 20 tablet, Rfl: 0  Assessment/ Plan: 58 y.o. female   1. COPD exacerbation (HCC) - predniSONE (STERAPRED UNI-PAK 48 TAB) 10 MG (48) TBPK tablet; Take as directed for 12 days  Dispense: 48 tablet; Refill: 0 - doxycycline (VIBRA-TABS) 100 MG tablet; Take 1 tablet (100 mg total) by mouth 2 (two) times daily. 1 po bid  Dispense: 20 tablet; Refill: 0  2. Well adult exam - CBC with Differential/Platelet; Future - CMP14+EGFR; Future - Lipid panel; Future - TSH; Future    Start time: 11:24 AM End time: 11:36 AM  No orders of the defined types were placed in this encounter.   Alicia Nearing PA-C [facility] [phone]

## 2019-03-16 ENCOUNTER — Telehealth: Payer: Self-pay | Admitting: Physician Assistant

## 2019-03-16 NOTE — Telephone Encounter (Signed)
appt scheduled Pt notified 

## 2019-03-17 ENCOUNTER — Other Ambulatory Visit: Payer: Self-pay

## 2019-03-17 ENCOUNTER — Encounter: Payer: Self-pay | Admitting: Physician Assistant

## 2019-03-17 ENCOUNTER — Ambulatory Visit (INDEPENDENT_AMBULATORY_CARE_PROVIDER_SITE_OTHER): Payer: Medicaid Other | Admitting: Physician Assistant

## 2019-03-17 DIAGNOSIS — T7840XD Allergy, unspecified, subsequent encounter: Secondary | ICD-10-CM

## 2019-03-17 DIAGNOSIS — N3281 Overactive bladder: Secondary | ICD-10-CM | POA: Diagnosis not present

## 2019-03-17 DIAGNOSIS — J441 Chronic obstructive pulmonary disease with (acute) exacerbation: Secondary | ICD-10-CM | POA: Diagnosis not present

## 2019-03-17 DIAGNOSIS — F411 Generalized anxiety disorder: Secondary | ICD-10-CM

## 2019-03-18 MED ORDER — EPINEPHRINE 0.3 MG/0.3ML IJ SOAJ: 0.3000 mg | INTRAMUSCULAR | 2 refills | Status: DC | PRN

## 2019-03-18 MED ORDER — MIRABEGRON ER 25 MG PO TB24: 25.0000 mg | ORAL_TABLET | Freq: Every day | ORAL | 6 refills | Status: DC

## 2019-03-18 MED ORDER — BUSPIRONE HCL 15 MG PO TABS: 15.0000 mg | ORAL_TABLET | Freq: Three times a day (TID) | ORAL | 2 refills | Status: DC

## 2019-03-18 MED ORDER — NICOTINE 14 MG/24HR TD PT24: 14.0000 mg | MEDICATED_PATCH | Freq: Every day | TRANSDERMAL | 1 refills | Status: DC

## 2019-03-18 MED ORDER — CETIRIZINE HCL 10 MG PO TABS: 10.0000 mg | ORAL_TABLET | Freq: Every day | ORAL | 11 refills | Status: DC

## 2019-03-18 NOTE — Progress Notes (Signed)
Telephone visit  Subjective: CC: Multiple medical conditions PCP: Terald Sleeper, PA-C MIW:OEHOZY Alicia Sanchez is a 58 y.o. female calls for telephone consult today. Patient provides verbal consent for consult held via phone.  Patient is identified with 2 separate identifiers.  At this time the entire area is on COVID-19 social distancing and stay home orders are in place.  Patient is of higher risk and therefore we are performing this by a virtual method.  Location of patient: Home Location of provider: [facility] Others present for call: Husband in the background  This patient is having a periodic recheck by telephone.  She states that overall she is feeling better.  She had a episode where she had hives "go up her body and begin to have her some shortness of breath.  She was taken by EMS to the emergency department.  They did give her medicines and things are much more calm down.  She has not had any more episodes like this.  She has never had this type of situation before.  She does not know of any new or different things that she would have contacted, ingested.  She was not given an EpiPen.  Therefore I would like for her to have one in case this happens again.  If she finds that she has to use it she still needs to call 911.  I have asked her to study it to know how to use it before the situation arises.  Patient reports that she has had her Pap performed this year and a Cologuard.  She states that she is having more difficulty with her overactive bladder.  She had been taking oxybutynin and is having no improvement.  We will stop this medication.  I do feel like the interruption of her sleep is not helping her feel better.  We will try Myrbetriq for her overactive bladder.  We will also get urology referral back in place.   ROS: Per HPI  No Known Allergies Past Medical History:  Diagnosis Date  . Anxiety   . COPD (chronic obstructive pulmonary disease) (HCC)     Current  Outpatient Medications:  .  albuterol (PROVENTIL HFA;VENTOLIN HFA) 108 (90 Base) MCG/ACT inhaler, Inhale 2 puffs into the lungs every 6 (six) hours as needed for wheezing or shortness of breath., Disp: 1 Inhaler, Rfl: 6 .  ALPRAZolam (XANAX) 0.5 MG tablet, Take 1 tablet (0.5 mg total) by mouth 2 (two) times daily as needed. for anxiety, Disp: 60 tablet, Rfl: 1 .  baclofen (LIORESAL) 10 MG tablet, TAKE 1 TABLET BY MOUTH TWICE DAILY, Disp: 40 tablet, Rfl: 2 .  busPIRone (BUSPAR) 15 MG tablet, Take 1 tablet (15 mg total) by mouth 3 (three) times daily., Disp: 90 tablet, Rfl: 2 .  cetirizine (ZYRTEC) 10 MG tablet, Take 1 tablet (10 mg total) by mouth daily., Disp: 30 tablet, Rfl: 11 .  diltiazem (CARDIZEM) 30 MG tablet, Take 1 tablet (30 mg total) by mouth 2 (two) times daily as needed., Disp: 180 tablet, Rfl: 1 .  doxepin (SINEQUAN) 25 MG capsule, Take 1-2 capsules (25-50 mg total) by mouth at bedtime., Disp: 180 capsule, Rfl: 3 .  DULoxetine (CYMBALTA) 60 MG capsule, Take 2 capsules (120 mg total) by mouth daily., Disp: 180 capsule, Rfl: 3 .  EPINEPHrine 0.3 mg/0.3 mL IJ SOAJ injection, Inject 0.3 mLs (0.3 mg total) into the muscle as needed for anaphylaxis., Disp: 1 Device, Rfl: 2 .  Fluticasone-Umeclidin-Vilant (TRELEGY ELLIPTA) 100-62.5-25  MCG/INH AEPB, Inhale 1 puff into the lungs daily., Disp: 1 each, Rfl: 0 .  ipratropium-albuterol (DUONEB) 0.5-2.5 (3) MG/3ML SOLN, Take 3 mLs every 6 (six) hours as needed by nebulization., Disp: 360 mL, Rfl: 3 .  meclizine (ANTIVERT) 25 MG tablet, TAKE 1 TABLET BY MOUTH THREE TIMES DAILY AS NEEDED FOR DIZZINESS, Disp: 60 tablet, Rfl: 0 .  meloxicam (MOBIC) 7.5 MG tablet, Take 1 tablet (7.5 mg total) by mouth daily., Disp: 30 tablet, Rfl: 3 .  mirabegron ER (MYRBETRIQ) 25 MG TB24 tablet, Take 1 tablet (25 mg total) by mouth daily., Disp: 30 tablet, Rfl: 6 .  Multiple Vitamin (MULTIVITAMIN WITH MINERALS) TABS tablet, Take 1 tablet by mouth daily., Disp: , Rfl:  .   nicotine (NICODERM CQ - DOSED IN MG/24 HOURS) 14 mg/24hr patch, Place 1 patch (14 mg total) onto the skin daily. Then fill with 7 mg dose for month 2, Disp: 30 patch, Rfl: 1 .  predniSONE (STERAPRED UNI-PAK 48 TAB) 10 MG (48) TBPK tablet, Take as directed for 12 days, Disp: 48 tablet, Rfl: 0 .  Respiratory Therapy Supplies (NEBULIZER/TUBING/MOUTHPIECE) KIT, 1 Units 4 (four) times daily by Does not apply route. GIVE face mask and tubing for nebulizer, Disp: 1 each, Rfl: 11 .  rOPINIRole (REQUIP) 0.5 MG tablet, TAKE 1 TO 2 TABLETS BY MOUTH AT BEDTIME, Disp: 180 tablet, Rfl: 0 .  rOPINIRole (REQUIP) 1 MG tablet, TAKE 1 TO 4 TABLETS BY MOUTH AT BEDTIME, Disp: 360 tablet, Rfl: 3 .  traMADol (ULTRAM) 50 MG tablet, Take 1 tablet (50 mg total) by mouth 2 (two) times daily as needed., Disp: 20 tablet, Rfl: 0  Assessment/ Plan: 58 y.o. female   1. COPD with acute exacerbation (HCC) - nicotine (NICODERM CQ - DOSED IN MG/24 HOURS) 14 mg/24hr patch; Place 1 patch (14 mg total) onto the skin daily. Then fill with 7 mg dose for month 2  Dispense: 30 patch; Refill: 1  2. GAD (generalized anxiety disorder) - busPIRone (BUSPAR) 15 MG tablet; Take 1 tablet (15 mg total) by mouth 3 (three) times daily.  Dispense: 90 tablet; Refill: 2  3. OAB (overactive bladder) - mirabegron ER (MYRBETRIQ) 25 MG TB24 tablet; Take 1 tablet (25 mg total) by mouth daily.  Dispense: 30 tablet; Refill: 6  4. Allergic reaction, subsequent encounter - cetirizine (ZYRTEC) 10 MG tablet; Take 1 tablet (10 mg total) by mouth daily.  Dispense: 30 tablet; Refill: 11 - EPINEPHrine 0.3 mg/0.3 mL IJ SOAJ injection; Inject 0.3 mLs (0.3 mg total) into the muscle as needed for anaphylaxis.  Dispense: 1 Device; Refill: 2   Start time: 12:01 PM End time: 12:16 PM  Meds ordered this encounter  Medications  . cetirizine (ZYRTEC) 10 MG tablet    Sig: Take 1 tablet (10 mg total) by mouth daily.    Dispense:  30 tablet    Refill:  11    Order  Specific Question:   Supervising Provider    Answer:   Janora Norlander [[id number]]  . EPINEPHrine 0.3 mg/0.3 mL IJ SOAJ injection    Sig: Inject 0.3 mLs (0.3 mg total) into the muscle as needed for anaphylaxis.    Dispense:  1 Device    Refill:  2    Order Specific Question:   Supervising Provider    Answer:   Janora Norlander [[id number]]  . nicotine (NICODERM CQ - DOSED IN MG/24 HOURS) 14 mg/24hr patch    Sig: Place 1 patch (14 mg total)  onto the skin daily. Then fill with 7 mg dose for month 2    Dispense:  30 patch    Refill:  1    Next month: Refill with 14 mg and the next month refill with 7 mg patches    Order Specific Question:   Supervising Provider    Answer:   Janora Norlander [[id number]]  . mirabegron ER (MYRBETRIQ) 25 MG TB24 tablet    Sig: Take 1 tablet (25 mg total) by mouth daily.    Dispense:  30 tablet    Refill:  6    Order Specific Question:   Supervising Provider    Answer:   Janora Norlander [[id number]]  . busPIRone (BUSPAR) 15 MG tablet    Sig: Take 1 tablet (15 mg total) by mouth 3 (three) times daily.    Dispense:  90 tablet    Refill:  2    Order Specific Question:   Supervising Provider    Answer:   Janora Norlander [[id number]]    Particia Nearing PA-C [place] [phone]

## 2019-04-14 ENCOUNTER — Other Ambulatory Visit: Payer: Self-pay | Admitting: Physician Assistant

## 2019-04-14 DIAGNOSIS — F411 Generalized anxiety disorder: Secondary | ICD-10-CM

## 2019-05-04 ENCOUNTER — Other Ambulatory Visit: Payer: Self-pay | Admitting: Physician Assistant

## 2019-05-12 ENCOUNTER — Encounter: Payer: Self-pay | Admitting: Physician Assistant

## 2019-05-12 ENCOUNTER — Ambulatory Visit (INDEPENDENT_AMBULATORY_CARE_PROVIDER_SITE_OTHER): Payer: PRIVATE HEALTH INSURANCE | Admitting: Physician Assistant

## 2019-05-12 ENCOUNTER — Other Ambulatory Visit: Payer: Self-pay

## 2019-05-12 DIAGNOSIS — F411 Generalized anxiety disorder: Secondary | ICD-10-CM

## 2019-05-12 DIAGNOSIS — J441 Chronic obstructive pulmonary disease with (acute) exacerbation: Secondary | ICD-10-CM | POA: Diagnosis not present

## 2019-05-12 DIAGNOSIS — G2581 Restless legs syndrome: Secondary | ICD-10-CM

## 2019-05-12 MED ORDER — BUDESONIDE-FORMOTEROL FUMARATE 160-4.5 MCG/ACT IN AERO: 2.0000 | INHALATION_SPRAY | Freq: Two times a day (BID) | RESPIRATORY_TRACT | 11 refills | Status: DC

## 2019-05-12 MED ORDER — PREDNISONE 10 MG (48) PO TBPK: ORAL_TABLET | ORAL | 2 refills | Status: DC

## 2019-05-12 MED ORDER — BUSPIRONE HCL 30 MG PO TABS: 30.0000 mg | ORAL_TABLET | Freq: Three times a day (TID) | ORAL | 5 refills | Status: DC

## 2019-05-12 MED ORDER — ALPRAZOLAM 0.5 MG PO TABS: 0.5000 mg | ORAL_TABLET | Freq: Two times a day (BID) | ORAL | 2 refills | Status: DC | PRN

## 2019-05-12 MED ORDER — ROPINIROLE HCL 1 MG PO TABS: ORAL_TABLET | ORAL | 3 refills | Status: DC

## 2019-05-12 NOTE — Progress Notes (Signed)
Telephone visit  Subjective: CC: COPD, generalized anxiety disorder, restless legs. PCP: Terald Sleeper, PA-C [id number] [place] is a 58 y.o. female calls for telephone consult today. Patient provides verbal consent for consult held via phone.  Patient is identified with 2 separate identifiers.  At this time the entire area is on COVID-19 social distancing and stay home orders are in place.  Patient is of higher risk and therefore we are performing this by a virtual method.  Location of patient: Home Location of provider: HOME Others present for call: No  This patient is for a visit for recheck on her chronic medical conditions and refills on her medications.  She states that her COPD has been fairly well controlled.  It is been a good thing that she is at home and she is really not gotten sick much for many weeks.  She is about to run out of her Trelegy.  She has paperwork and to try to get Symbicort on an indigent program.  She does have some prednisone and I told her to take 1 daily until she gets the Symbicort.  She is not able to afford Incruse.  Says she will soon stop that.  And she does have Atrovent neb solution.  I encouraged her to use that 2-4 times per day.  She states overall she is doing fairly well with her depression and restless legs.  She does need refills in the medications are still doing well.  She has no other complaints at this time.   ROS: Per HPI  No Known Allergies Past Medical History:  Diagnosis Date  . Anxiety   . COPD (chronic obstructive pulmonary disease) (HCC)     Current Outpatient Medications:  .  albuterol (PROVENTIL HFA;VENTOLIN HFA) 108 (90 Base) MCG/ACT inhaler, Inhale 2 puffs into the lungs every 6 (six) hours as needed for wheezing or shortness of breath., Disp: 1 Inhaler, Rfl: 6 .  ALPRAZolam (XANAX) 0.5 MG tablet, Take 1 tablet (0.5 mg total) by mouth 2 (two) times daily as needed. for anxiety, Disp: 60 tablet, Rfl: 2 .  baclofen  (LIORESAL) 10 MG tablet, TAKE 1 TABLET BY MOUTH TWICE DAILY, Disp: 40 tablet, Rfl: 2 .  budesonide-formoterol (SYMBICORT) 160-4.5 MCG/ACT inhaler, Inhale 2 puffs into the lungs 2 (two) times daily., Disp: 1 Inhaler, Rfl: 11 .  busPIRone (BUSPAR) 30 MG tablet, Take 1 tablet (30 mg total) by mouth 3 (three) times daily., Disp: 90 tablet, Rfl: 5 .  cetirizine (ZYRTEC) 10 MG tablet, Take 1 tablet (10 mg total) by mouth daily., Disp: 30 tablet, Rfl: 11 .  diltiazem (CARDIZEM) 30 MG tablet, Take 1 tablet (30 mg total) by mouth 2 (two) times daily as needed., Disp: 180 tablet, Rfl: 1 .  doxepin (SINEQUAN) 25 MG capsule, Take 1-2 capsules (25-50 mg total) by mouth at bedtime., Disp: 180 capsule, Rfl: 3 .  DULoxetine (CYMBALTA) 60 MG capsule, Take 2 capsules (120 mg total) by mouth daily., Disp: 180 capsule, Rfl: 3 .  EPINEPHrine 0.3 mg/0.3 mL IJ SOAJ injection, Inject 0.3 mLs (0.3 mg total) into the muscle as needed for anaphylaxis., Disp: 1 Device, Rfl: 2 .  ipratropium-albuterol (DUONEB) 0.5-2.5 (3) MG/3ML SOLN, Take 3 mLs every 6 (six) hours as needed by nebulization., Disp: 360 mL, Rfl: 3 .  meclizine (ANTIVERT) 25 MG tablet, TAKE 1 TABLET BY MOUTH THREE TIMES DAILY AS NEEDED FOR DIZZINESS, Disp: 60 tablet, Rfl: 0 .  meloxicam (MOBIC) 7.5 MG tablet, Take 1 tablet (  7.5 mg total) by mouth daily., Disp: 30 tablet, Rfl: 3 .  mirabegron ER (MYRBETRIQ) 25 MG TB24 tablet, Take 1 tablet (25 mg total) by mouth daily., Disp: 30 tablet, Rfl: 6 .  Multiple Vitamin (MULTIVITAMIN WITH MINERALS) TABS tablet, Take 1 tablet by mouth daily., Disp: , Rfl:  .  nicotine (NICODERM CQ - DOSED IN MG/24 HOURS) 14 mg/24hr patch, Place 1 patch (14 mg total) onto the skin daily. Then fill with 7 mg dose for month 2, Disp: 30 patch, Rfl: 1 .  predniSONE (STERAPRED UNI-PAK 48 TAB) 10 MG (48) TBPK tablet, Take as directed for 12 days, Disp: 48 tablet, Rfl: 2 .  Respiratory Therapy Supplies (NEBULIZER/TUBING/MOUTHPIECE) KIT, 1 Units 4  (four) times daily by Does not apply route. GIVE face mask and tubing for nebulizer, Disp: 1 each, Rfl: 11 .  rOPINIRole (REQUIP) 1 MG tablet, TAKE 1 TO 4 TABLETS BY MOUTH AT BEDTIME, Disp: 360 tablet, Rfl: 3  Assessment/ Plan: 58 y.o. female   1. Generalized anxiety disorder - ALPRAZolam (XANAX) 0.5 MG tablet; Take 1 tablet (0.5 mg total) by mouth 2 (two) times daily as needed. for anxiety  Dispense: 60 tablet; Refill: 2  2. GAD (generalized anxiety disorder) - busPIRone (BUSPAR) 30 MG tablet; Take 1 tablet (30 mg total) by mouth 3 (three) times daily.  Dispense: 90 tablet; Refill: 5  3. Restless legs - rOPINIRole (REQUIP) 1 MG tablet; TAKE 1 TO 4 TABLETS BY MOUTH AT BEDTIME  Dispense: 360 tablet; Refill: 3  4. COPD exacerbation (HCC) - predniSONE (STERAPRED UNI-PAK 48 TAB) 10 MG (48) TBPK tablet; Take as directed for 12 days  Dispense: 48 tablet; Refill: 2 - budesonide-formoterol (SYMBICORT) 160-4.5 MCG/ACT inhaler; Inhale 2 puffs into the lungs 2 (two) times daily.  Dispense: 1 Inhaler; Refill: 11   Continue all other maintenance medications as listed above.  Start time: 11:03 AM End time: 11:17 AM  Meds ordered this encounter  Medications  . ALPRAZolam (XANAX) 0.5 MG tablet    Sig: Take 1 tablet (0.5 mg total) by mouth 2 (two) times daily as needed. for anxiety    Dispense:  60 tablet    Refill:  2    Order Specific Question:   Supervising Provider    Answer:   Janora Norlander [[id number]]  . busPIRone (BUSPAR) 30 MG tablet    Sig: Take 1 tablet (30 mg total) by mouth 3 (three) times daily.    Dispense:  90 tablet    Refill:  5    Order Specific Question:   Supervising Provider    Answer:   Janora Norlander [[id number]]  . rOPINIRole (REQUIP) 1 MG tablet    Sig: TAKE 1 TO 4 TABLETS BY MOUTH AT BEDTIME    Dispense:  360 tablet    Refill:  3    Order Specific Question:   Supervising Provider    Answer:   Janora Norlander [[id number]]  . predniSONE (STERAPRED UNI-PAK 48  TAB) 10 MG (48) TBPK tablet    Sig: Take as directed for 12 days    Dispense:  48 tablet    Refill:  2    Order Specific Question:   Supervising Provider    Answer:   Janora Norlander [[id number]]  . budesonide-formoterol (SYMBICORT) 160-4.5 MCG/ACT inhaler    Sig: Inhale 2 puffs into the lungs 2 (two) times daily.    Dispense:  1 Inhaler    Refill:  11    Order Specific  Question:   Supervising Provider    Answer:   Janora Norlander [[id number]]    Particia Nearing PA-C [place] [phone]

## 2019-05-14 ENCOUNTER — Telehealth: Payer: Self-pay | Admitting: Physician Assistant

## 2019-05-14 NOTE — Telephone Encounter (Signed)
Please review

## 2019-05-15 ENCOUNTER — Other Ambulatory Visit: Payer: Self-pay | Admitting: Physician Assistant

## 2019-05-15 ENCOUNTER — Telehealth: Payer: Self-pay | Admitting: Physician Assistant

## 2019-05-15 DIAGNOSIS — J441 Chronic obstructive pulmonary disease with (acute) exacerbation: Secondary | ICD-10-CM

## 2019-05-15 MED ORDER — BUDESONIDE-FORMOTEROL FUMARATE 160-4.5 MCG/ACT IN AERO: 2.0000 | INHALATION_SPRAY | Freq: Two times a day (BID) | RESPIRATORY_TRACT | 11 refills | Status: DC

## 2019-05-15 NOTE — Telephone Encounter (Signed)
Printed and mailed rx for pt

## 2019-05-18 NOTE — Telephone Encounter (Signed)
Form has already been scanned into system, printed & mailed

## 2019-06-24 ENCOUNTER — Other Ambulatory Visit: Payer: Self-pay | Admitting: Physician Assistant

## 2019-06-24 ENCOUNTER — Other Ambulatory Visit: Payer: Self-pay | Admitting: Cardiology

## 2019-07-09 ENCOUNTER — Telehealth: Payer: Self-pay | Admitting: Physician Assistant

## 2019-07-09 NOTE — Telephone Encounter (Signed)
Televisit appt made.  

## 2019-07-10 ENCOUNTER — Ambulatory Visit (INDEPENDENT_AMBULATORY_CARE_PROVIDER_SITE_OTHER): Payer: PRIVATE HEALTH INSURANCE | Admitting: Physician Assistant

## 2019-07-10 ENCOUNTER — Encounter: Payer: Self-pay | Admitting: Physician Assistant

## 2019-07-10 DIAGNOSIS — J441 Chronic obstructive pulmonary disease with (acute) exacerbation: Secondary | ICD-10-CM

## 2019-07-10 MED ORDER — PREDNISONE 10 MG (21) PO TBPK: ORAL_TABLET | ORAL | 0 refills | Status: DC

## 2019-07-10 MED ORDER — CEFDINIR 300 MG PO CAPS: 300.0000 mg | ORAL_CAPSULE | Freq: Two times a day (BID) | ORAL | 0 refills | Status: DC

## 2019-07-10 NOTE — Progress Notes (Signed)
Telephone visit  Subjective: [name] PCP: Alicia Sleeper, PA-C NOB:Alicia Sanchez is a 58 y.o. female calls for telephone consult today. Patient provides verbal consent for consult held via phone.  Patient is identified with 2 separate identifiers.  At this time the entire area is on COVID-19 social distancing and stay home orders are in place.  Patient is of higher risk and therefore we are performing this by a virtual method.  Location of patient: home Location of provider: HOME Others present for call: no  Patient with several days of progressing upper respiratory and bronchial symptoms. Initially there was more upper respiratory congestion. This progressed to having significant cough that is productive throughout the day and severe at night. There is occasional wheezing after coughing. Sometimes there is slight dyspnea on exertion. It is productive mucus that is yellow in color. Denies any blood.    ROS: Per HPI  No Known Allergies Past Medical History:  Diagnosis Date  . Anxiety   . COPD (chronic obstructive pulmonary disease) (HCC)     Current Outpatient Medications:  .  albuterol (PROVENTIL HFA;VENTOLIN HFA) 108 (90 Base) MCG/ACT inhaler, Inhale 2 puffs into the lungs every 6 (six) hours as needed for wheezing or shortness of breath., Disp: 1 Inhaler, Rfl: 6 .  ALPRAZolam (XANAX) 0.5 MG tablet, Take 1 tablet (0.5 mg total) by mouth 2 (two) times daily as needed. for anxiety, Disp: 60 tablet, Rfl: 2 .  baclofen (LIORESAL) 10 MG tablet, TAKE 1 TABLET BY MOUTH TWICE DAILY, Disp: 40 tablet, Rfl: 2 .  budesonide-formoterol (SYMBICORT) 160-4.5 MCG/ACT inhaler, Inhale 2 puffs into the lungs 2 (two) times daily., Disp: 1 Inhaler, Rfl: 11 .  busPIRone (BUSPAR) 30 MG tablet, Take 1 tablet (30 mg total) by mouth 3 (three) times daily., Disp: 90 tablet, Rfl: 5 .  cefdinir (OMNICEF) 300 MG capsule, Take 1 capsule (300 mg total) by mouth 2 (two) times daily. 1 po BID, Disp: 20  capsule, Rfl: 0 .  cetirizine (ZYRTEC) 10 MG tablet, Take 1 tablet (10 mg total) by mouth daily., Disp: 30 tablet, Rfl: 11 .  diltiazem (CARDIZEM) 30 MG tablet, Take 1 tablet by mouth twice daily as needed, Disp: 180 tablet, Rfl: 0 .  doxepin (SINEQUAN) 25 MG capsule, Take 1-2 capsules (25-50 mg total) by mouth at bedtime., Disp: 180 capsule, Rfl: 3 .  DULoxetine (CYMBALTA) 60 MG capsule, Take 2 capsules (120 mg total) by mouth daily., Disp: 180 capsule, Rfl: 3 .  EPINEPHrine 0.3 mg/0.3 mL IJ SOAJ injection, Inject 0.3 mLs (0.3 mg total) into the muscle as needed for anaphylaxis., Disp: 1 Device, Rfl: 2 .  ipratropium-albuterol (DUONEB) 0.5-2.5 (3) MG/3ML SOLN, Take 3 mLs every 6 (six) hours as needed by nebulization., Disp: 360 mL, Rfl: 3 .  meclizine (ANTIVERT) 25 MG tablet, TAKE 1 TABLET BY MOUTH THREE TIMES DAILY AS NEEDED FOR DIZZINESS, Disp: 60 tablet, Rfl: 0 .  meloxicam (MOBIC) 7.5 MG tablet, Take 1 tablet (7.5 mg total) by mouth daily., Disp: 30 tablet, Rfl: 3 .  mirabegron ER (MYRBETRIQ) 25 MG TB24 tablet, Take 1 tablet (25 mg total) by mouth daily., Disp: 30 tablet, Rfl: 6 .  Multiple Vitamin (MULTIVITAMIN WITH MINERALS) TABS tablet, Take 1 tablet by mouth daily., Disp: , Rfl:  .  nicotine (NICODERM CQ - DOSED IN MG/24 HOURS) 14 mg/24hr patch, Place 1 patch (14 mg total) onto the skin daily. Then fill with 7 mg dose for month 2, Disp: 30 patch, Rfl:  1 .  predniSONE (STERAPRED UNI-PAK 21 TAB) 10 MG (21) TBPK tablet, As directed x 6 days, Disp: 21 tablet, Rfl: 0 .  Respiratory Therapy Supplies (NEBULIZER/TUBING/MOUTHPIECE) KIT, 1 Units 4 (four) times daily by Does not apply route. GIVE face mask and tubing for nebulizer, Disp: 1 each, Rfl: 11 .  rOPINIRole (REQUIP) 1 MG tablet, TAKE 1 TO 4 TABLETS BY MOUTH AT BEDTIME, Disp: 360 tablet, Rfl: 3  Assessment/ Plan: 58 y.o. female   1. Acute exacerbation of chronic obstructive pulmonary disease (COPD) (HCC) - cefdinir (OMNICEF) 300 MG  capsule; Take 1 capsule (300 mg total) by mouth 2 (two) times daily. 1 po BID  Dispense: 20 capsule; Refill: 0 - predniSONE (STERAPRED UNI-PAK 21 TAB) 10 MG (21) TBPK tablet; As directed x 6 days  Dispense: 21 tablet; Refill: 0   No follow-ups on file.  Continue all other maintenance medications as listed above.  Start time: 11:35 AM End time: 11:44 AM  Meds ordered this encounter  Medications  . cefdinir (OMNICEF) 300 MG capsule    Sig: Take 1 capsule (300 mg total) by mouth 2 (two) times daily. 1 po BID    Dispense:  20 capsule    Refill:  0    Order Specific Question:   Supervising Provider    Answer:   Alicia Sanchez [[id number]]  . predniSONE (STERAPRED UNI-PAK 21 TAB) 10 MG (21) TBPK tablet    Sig: As directed x 6 days    Dispense:  21 tablet    Refill:  0    Order Specific Question:   Supervising Provider    Answer:   Alicia Sanchez [[id number]]    Alicia Nearing PA-C [place] [phone]

## 2019-07-13 ENCOUNTER — Ambulatory Visit: Payer: PRIVATE HEALTH INSURANCE | Admitting: Physician Assistant

## 2019-09-08 ENCOUNTER — Other Ambulatory Visit: Payer: Self-pay | Admitting: Physician Assistant

## 2019-09-11 ENCOUNTER — Other Ambulatory Visit: Payer: Self-pay | Admitting: Physician Assistant

## 2019-09-11 DIAGNOSIS — F411 Generalized anxiety disorder: Secondary | ICD-10-CM

## 2019-09-15 ENCOUNTER — Ambulatory Visit: Payer: PRIVATE HEALTH INSURANCE | Admitting: Family Medicine

## 2019-09-15 ENCOUNTER — Other Ambulatory Visit: Payer: Self-pay | Admitting: Physician Assistant

## 2019-09-15 ENCOUNTER — Telehealth: Payer: Self-pay | Admitting: Physician Assistant

## 2019-09-15 ENCOUNTER — Encounter: Payer: Self-pay | Admitting: Physician Assistant

## 2019-09-15 ENCOUNTER — Ambulatory Visit: Payer: PRIVATE HEALTH INSURANCE | Admitting: Family

## 2019-09-15 DIAGNOSIS — J441 Chronic obstructive pulmonary disease with (acute) exacerbation: Secondary | ICD-10-CM

## 2019-09-15 MED ORDER — PREDNISONE 10 MG (48) PO TBPK: ORAL_TABLET | ORAL | 0 refills | Status: DC

## 2019-09-15 MED ORDER — CEFDINIR 300 MG PO CAPS: 300.0000 mg | ORAL_CAPSULE | Freq: Two times a day (BID) | ORAL | 0 refills | Status: DC

## 2019-09-15 MED ORDER — DOXYCYCLINE HYCLATE 100 MG PO TABS: 100.0000 mg | ORAL_TABLET | Freq: Two times a day (BID) | ORAL | 0 refills | Status: DC

## 2019-09-15 NOTE — Progress Notes (Signed)
sterap

## 2019-09-15 NOTE — Telephone Encounter (Signed)
PATIENT AWARE AND VERBALIZED UNDERSTANDING. °

## 2019-09-15 NOTE — Telephone Encounter (Signed)
Sent script for doxycycline and prednisone pack to pharmacy

## 2019-09-15 NOTE — Telephone Encounter (Signed)
Aware. 

## 2019-09-15 NOTE — Telephone Encounter (Signed)
I added to allergy, changed to [facility]

## 2019-10-10 ENCOUNTER — Other Ambulatory Visit: Payer: Self-pay | Admitting: Physician Assistant

## 2019-10-10 DIAGNOSIS — F411 Generalized anxiety disorder: Secondary | ICD-10-CM

## 2019-10-14 ENCOUNTER — Ambulatory Visit (INDEPENDENT_AMBULATORY_CARE_PROVIDER_SITE_OTHER): Payer: PRIVATE HEALTH INSURANCE | Admitting: Physician Assistant

## 2019-10-14 ENCOUNTER — Encounter: Payer: Self-pay | Admitting: Physician Assistant

## 2019-10-14 DIAGNOSIS — J441 Chronic obstructive pulmonary disease with (acute) exacerbation: Secondary | ICD-10-CM | POA: Diagnosis not present

## 2019-10-14 MED ORDER — LEVOFLOXACIN 500 MG PO TABS: 500.0000 mg | ORAL_TABLET | Freq: Every day | ORAL | 0 refills | Status: DC

## 2019-10-14 MED ORDER — HYDROCODONE-HOMATROPINE 5-1.5 MG/5ML PO SYRP: 5.0000 mL | ORAL_SOLUTION | Freq: Four times a day (QID) | ORAL | 0 refills | Status: DC | PRN

## 2019-10-14 MED ORDER — PREDNISONE 10 MG (48) PO TBPK: ORAL_TABLET | ORAL | 0 refills | Status: DC

## 2019-10-14 NOTE — Progress Notes (Signed)
Telephone visit  Subjective: [id number] PCP: Terald Sleeper, PA-C KCL:EXNTZG Alicia Sanchez is a 58 y.o. female calls for telephone consult today. Patient provides verbal consent for consult held via phone.  Patient is identified with 2 separate identifiers.  At this time the entire area is on COVID-19 social distancing and stay home orders are in place.  Patient is of higher risk and therefore we are performing this by a virtual method.  Location of patient: home Location of provider: HOME Others present for call: no  Patient with several days of progressing upper respiratory and bronchial symptoms. Initially there was more upper respiratory congestion. This progressed to having significant cough that is productive throughout the day and severe at night. There is occasional wheezing after coughing. Sometimes there is slight dyspnea on exertion. It is productive mucus that is yellow in color. Denies any blood.    ROS: Per HPI  Allergies  Allergen Reactions  . Doxycycline     Chest pain   Past Medical History:  Diagnosis Date  . Anxiety   . COPD (chronic obstructive pulmonary disease) (HCC)     Current Outpatient Medications:  .  albuterol (PROVENTIL HFA;VENTOLIN HFA) 108 (90 Base) MCG/ACT inhaler, Inhale 2 puffs into the lungs every 6 (six) hours as needed for wheezing or shortness of breath., Disp: 1 Inhaler, Rfl: 6 .  ALPRAZolam (XANAX) 0.5 MG tablet, Take 1 tablet by mouth twice daily as needed for anxiety, Disp: 60 tablet, Rfl: 0 .  baclofen (LIORESAL) 10 MG tablet, Take 1 tablet by mouth twice daily, Disp: 40 tablet, Rfl: 0 .  busPIRone (BUSPAR) 30 MG tablet, Take 1 tablet (30 mg total) by mouth 3 (three) times daily., Disp: 90 tablet, Rfl: 5 .  cetirizine (ZYRTEC) 10 MG tablet, Take 1 tablet (10 mg total) by mouth daily., Disp: 30 tablet, Rfl: 11 .  diltiazem (CARDIZEM) 30 MG tablet, Take 1 tablet by mouth twice daily as needed, Disp: 180 tablet, Rfl: 0 .  doxepin  (SINEQUAN) 25 MG capsule, Take 1-2 capsules (25-50 mg total) by mouth at bedtime., Disp: 180 capsule, Rfl: 3 .  DULoxetine (CYMBALTA) 60 MG capsule, Take 2 capsules (120 mg total) by mouth daily., Disp: 180 capsule, Rfl: 3 .  EPINEPHrine 0.3 mg/0.3 mL IJ SOAJ injection, Inject 0.3 mLs (0.3 mg total) into the muscle as needed for anaphylaxis., Disp: 1 Device, Rfl: 2 .  HYDROcodone-homatropine (HYCODAN) 5-1.5 MG/5ML syrup, Take 5 mLs by mouth every 6 (six) hours as needed for cough., Disp: 120 mL, Rfl: 0 .  ipratropium-albuterol (DUONEB) 0.5-2.5 (3) MG/3ML SOLN, Take 3 mLs every 6 (six) hours as needed by nebulization., Disp: 360 mL, Rfl: 3 .  levofloxacin (LEVAQUIN) 500 MG tablet, Take 1 tablet (500 mg total) by mouth daily., Disp: 10 tablet, Rfl: 0 .  meclizine (ANTIVERT) 25 MG tablet, TAKE 1 TABLET BY MOUTH THREE TIMES DAILY AS NEEDED FOR DIZZINESS, Disp: 60 tablet, Rfl: 0 .  meloxicam (MOBIC) 7.5 MG tablet, Take 1 tablet (7.5 mg total) by mouth daily., Disp: 30 tablet, Rfl: 3 .  mirabegron ER (MYRBETRIQ) 25 MG TB24 tablet, Take 1 tablet (25 mg total) by mouth daily., Disp: 30 tablet, Rfl: 6 .  Multiple Vitamin (MULTIVITAMIN WITH MINERALS) TABS tablet, Take 1 tablet by mouth daily., Disp: , Rfl:  .  nicotine (NICODERM CQ - DOSED IN MG/24 HOURS) 14 mg/24hr patch, Place 1 patch (14 mg total) onto the skin daily. Then fill with 7 mg dose for month 2,  Disp: 30 patch, Rfl: 1 .  predniSONE (STERAPRED UNI-PAK 48 TAB) 10 MG (48) TBPK tablet, Take as directed for 12 days, Disp: 48 tablet, Rfl: 0 .  Respiratory Therapy Supplies (NEBULIZER/TUBING/MOUTHPIECE) KIT, 1 Units 4 (four) times daily by Does not apply route. GIVE face mask and tubing for nebulizer, Disp: 1 each, Rfl: 11 .  rOPINIRole (REQUIP) 1 MG tablet, TAKE 1 TO 4 TABLETS BY MOUTH AT BEDTIME, Disp: 360 tablet, Rfl: 3 .  TRELEGY ELLIPTA 100-62.5-25 MCG/INH AEPB, Inhale 1 Inhaler into the lungs daily., Disp: , Rfl:   Assessment/ Plan: 58 y.o. female    1. COPD exacerbation ([place]) - TRELEGY ELLIPTA 100-62.5-25 MCG/INH AEPB; Inhale 1 Inhaler into the lungs daily. - predniSONE (STERAPRED UNI-PAK 48 TAB) 10 MG (48) TBPK tablet; Take as directed for 12 days  Dispense: 48 tablet; Refill: 0 - levofloxacin (LEVAQUIN) 500 MG tablet; Take 1 tablet (500 mg total) by mouth daily.  Dispense: 10 tablet; Refill: 0 - HYDROcodone-homatropine (HYCODAN) 5-1.5 MG/5ML syrup; Take 5 mLs by mouth every 6 (six) hours as needed for cough.  Dispense: 120 mL; Refill: 0   Return if symptoms worsen or fail to improve.  Continue all other maintenance medications as listed above.  Start time: 8:27 AM End time: 8:35 AM  Meds ordered this encounter  Medications  . predniSONE (STERAPRED UNI-PAK 48 TAB) 10 MG (48) TBPK tablet    Sig: Take as directed for 12 days    Dispense:  48 tablet    Refill:  0    Order Specific Question:   Supervising Provider    Answer:   Janora Norlander [[id number]]  . levofloxacin (LEVAQUIN) 500 MG tablet    Sig: Take 1 tablet (500 mg total) by mouth daily.    Dispense:  10 tablet    Refill:  0    Order Specific Question:   Supervising Provider    Answer:   Janora Norlander [[id number]]  . HYDROcodone-homatropine (HYCODAN) 5-1.5 MG/5ML syrup    Sig: Take 5 mLs by mouth every 6 (six) hours as needed for cough.    Dispense:  120 mL    Refill:  0    Order Specific Question:   Supervising Provider    Answer:   Janora Norlander [[id number]]    Particia Nearing PA-C [place] [phone]

## 2019-11-04 ENCOUNTER — Other Ambulatory Visit: Payer: Self-pay | Admitting: Physician Assistant

## 2019-11-04 ENCOUNTER — Telehealth: Payer: Self-pay | Admitting: Radiology

## 2019-11-04 DIAGNOSIS — F411 Generalized anxiety disorder: Secondary | ICD-10-CM

## 2019-11-04 NOTE — Telephone Encounter (Signed)
Received fax request from pharmacy requesting refill on Tramadol 50mg  tablet. Please advise.

## 2019-11-05 ENCOUNTER — Other Ambulatory Visit: Payer: Self-pay | Admitting: Orthopaedic Surgery

## 2019-11-05 MED ORDER — TRAMADOL HCL 50 MG PO TABS: 50.0000 mg | ORAL_TABLET | Freq: Four times a day (QID) | ORAL | 0 refills | Status: DC | PRN

## 2019-11-05 NOTE — Progress Notes (Signed)
Due to Covid patient is lost her insurance she will have insurance after the beginning of the year.  She had Ultram given in March.  I called patient discussed 10 tablets and then she will return to see me in January and states she would like to proceed with neck cervical fusion as soon as possible after that time due to chronic neck pain and radicular referred pain.  Ultram 10 tablets sent in for pain she will use it sparingly.

## 2019-11-06 ENCOUNTER — Other Ambulatory Visit: Payer: Self-pay | Admitting: Physician Assistant

## 2019-11-06 DIAGNOSIS — J441 Chronic obstructive pulmonary disease with (acute) exacerbation: Secondary | ICD-10-CM

## 2019-11-07 ENCOUNTER — Other Ambulatory Visit: Payer: Self-pay | Admitting: Physician Assistant

## 2019-11-07 DIAGNOSIS — F411 Generalized anxiety disorder: Secondary | ICD-10-CM

## 2019-11-19 ENCOUNTER — Other Ambulatory Visit: Payer: Self-pay | Admitting: Physician Assistant

## 2019-11-19 DIAGNOSIS — F411 Generalized anxiety disorder: Secondary | ICD-10-CM

## 2019-12-03 ENCOUNTER — Ambulatory Visit (INDEPENDENT_AMBULATORY_CARE_PROVIDER_SITE_OTHER): Payer: 59 | Admitting: Orthopaedic Surgery

## 2019-12-03 ENCOUNTER — Encounter: Payer: Self-pay | Admitting: Orthopaedic Surgery

## 2019-12-03 VITALS — BP 102/75 | HR 107 | Ht 65.0 in | Wt 170.0 lb

## 2019-12-03 DIAGNOSIS — M4802 Spinal stenosis, cervical region: Secondary | ICD-10-CM

## 2019-12-03 NOTE — Progress Notes (Signed)
Office Visit Note   Patient: Alicia Sanchez           Date of Birth: 28-Oct-1961           MRN: [id number] Visit Date: 12/03/2019              Requested by: Terald Sleeper, PA-C [address] [place],  [place] [address] PCP: Terald Sleeper, PA-C   Assessment & Plan: Visit Diagnoses:  1. Foraminal stenosis of cervical region     Plan: Patient states she is ready to schedule surgery.  Patient will need a new MRI scan for evaluation of her foraminal stenosis.  She has been treated with prednisone pack, anti-inflammatories, baclofen, heat, ice, stretching home program all without relief.  Follow-Up Instructions: Follow-up after cervical MRI scan.  Orders:  No orders of the defined types were placed in this encounter.  No orders of the defined types were placed in this encounter.     Procedures: No procedures performed   Clinical Data: No additional findings.   Subjective: Chief Complaint  Patient presents with  . Neck - Pain    HPI 59 year old female returns with persistent problems with neck and radicular left arm pain that radiates down to the radial side of her left hand.  She has been through anti-inflammatory medication without relief.  MRI scan done 1 year ago showed severe foraminal stenosis on the left at C5-6.  Patient had some pulmonary problems she is not on portable oxygen currently.  Review of Systems positive COPD she has been on oxygen in the past.  COPD Gold 2.  History of UTIs.  Positive for bronchitis emphysema migraines bladder problems and left C 5 foraminal stenosis.   Objective: Vital Signs: BP 102/75   Pulse (!) 107   Ht 5\' 5"  (1.651 m)   Wt 170 lb (77.1 kg)   BMI 28.29 kg/m   Physical Exam Constitutional:      Appearance: She is well-developed.  HENT:     Head: Normocephalic.     Right Ear: External ear normal.     Left Ear: External ear normal.  Eyes:     Pupils: Pupils are equal, round, and reactive to light.  Neck:     Thyroid: No  thyromegaly.     Trachea: No tracheal deviation.  Cardiovascular:     Rate and Rhythm: Normal rate.  Pulmonary:     Effort: Pulmonary effort is normal. No respiratory distress.     Breath sounds: No wheezing.  Abdominal:     Palpations: Abdomen is soft.  Skin:    General: Skin is warm and dry.  Neurological:     Mental Status: She is alert and oriented to person, place, and time.  Psychiatric:        Behavior: Behavior normal.     Ortho Exam patient has positive Spurling on the left negative on the right.  Brachial plexus tenderness slightly worse on the right than left.  Upper extremity reflexes are 2+ no lower extremity clonus normal heel toe gait.  Decreased sensation radial side of the hand.  Negative Phalen's test left wrist.  Some increased pain with cervical compression no change with distraction. Specialty Comments:  No specialty comments available.  Imaging: No results found.   PMFS History: Patient Active Problem List   Diagnosis Date Noted  . Foraminal stenosis of cervical region 02/06/2019  . Other spondylosis with radiculopathy, cervical region 12/21/2018  . Chronic left shoulder pain 11/24/2018  .  Arthritis 11/24/2018  . 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 Actinic keratosis 07/17/2018  . Chronic respiratory failure with hypoxia (HCC) 07/16/2018  . Abnormal findings on diagnostic imaging of lung 06/18/2018  . COPD (chronic obstructive pulmonary disease) (HCC) 05/22/2018  . Acute respiratory failure with hypoxia (HCC)   . Acute cystitis with hematuria 08/21/2017  . Lung nodules 08/21/2017  . GOLD COPD II D 08/21/2017  . Recurrent UTI 08/21/2017  . Generalized anxiety disorder 03/22/2017  . Moderate episode of recurrent major depressive disorder (HCC) 12/16/2016  . Restless legs 12/16/2016  . OAB (overactive bladder) 12/16/2016  . COPD exacerbation (HCC) 11/19/2015  . Acute URI 11/19/2015  . Leukocytosis 11/19/2015  . 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 Tobacco abuse 11/19/2015  . Depression with anxiety 11/19/2015   Past Medical History:  Diagnosis Date  . Anxiety   . COPD (chronic obstructive pulmonary disease) (HCC)     Family History  Problem Relation Age of Onset  . COPD Mother   . COPD Father   . Diabetes type II Sister   . Diabetes type II Brother   . COPD Maternal Grandfather     Past Surgical History:  Procedure Laterality Date  . ABDOMINAL HYSTERECTOMY     Social History   Occupational History  . Not on file  Tobacco Use  . Smoking status: Current Every Day Smoker    Packs/day: 0.50    Years: 42.00    Pack years: 21.00    Types: Cigarettes  . Smokeless tobacco: Never Used  . Tobacco comment: 1/2 pack per day 07/03/2018 or more 07/16/18  Substance and Sexual Activity  . Alcohol use: No  . Drug use: No  . Sexual activity: Yes    Birth control/protection: Surgical

## 2019-12-03 NOTE — Addendum Note (Signed)
Addended by: Dimitry Holsworth M on: 09/22/2020 10:01 AM   Modules accepted: Orders  

## 2019-12-09 ENCOUNTER — Ambulatory Visit (INDEPENDENT_AMBULATORY_CARE_PROVIDER_SITE_OTHER): Payer: 59 | Admitting: Pulmonary Disease

## 2019-12-09 ENCOUNTER — Other Ambulatory Visit: Payer: Self-pay

## 2019-12-09 ENCOUNTER — Encounter: Payer: Self-pay | Admitting: Pulmonary Disease

## 2019-12-09 VITALS — BP 122/82 | HR 80 | Temp 97.6°F | Ht 64.5 in | Wt 171.0 lb

## 2019-12-09 DIAGNOSIS — J441 Chronic obstructive pulmonary disease with (acute) exacerbation: Secondary | ICD-10-CM | POA: Diagnosis not present

## 2019-12-09 DIAGNOSIS — Z720 Tobacco use: Secondary | ICD-10-CM

## 2019-12-09 DIAGNOSIS — F17210 Nicotine dependence, cigarettes, uncomplicated: Secondary | ICD-10-CM

## 2019-12-09 MED ORDER — VARENICLINE TARTRATE 0.5 MG X 11 & 1 MG X 42 PO MISC: ORAL | 0 refills | Status: DC

## 2019-12-09 MED ORDER — NICOTINE 14 MG/24HR TD PT24: 14.0000 mg | MEDICATED_PATCH | Freq: Every day | TRANSDERMAL | 1 refills | Status: DC

## 2019-12-09 NOTE — Patient Instructions (Addendum)
Will prescribe nicotine patches and Chantix for smoking cessation We will schedule you for low-dose screening CTs of the chest  Continue Trelegy inhaler Follow-up in 3 months.

## 2019-12-09 NOTE — Progress Notes (Signed)
Alicia Sanchez    [id number]    06-24-1961  Primary Care Physician:Jones, Alicia Moh, PA-C  Referring Physician: Terald Sleeper, PA-C [place],  [place] [id number]  Chief complaint:   Follow-up for COPD  HPI: Alicia Sanchez is a 59 year old active smoker with history of anxiety, depression, COPD.  Initially treated with Memory Dance and Incruse which was consolidated to Trelegy inhaler in 2018  She has daily symptoms of snoring. She does sleep study 4 years ago but does not know the result of this test. She is a diagnosis of restless leg syndrome and is on requip. She did not have the repeat sleep study done as it was not covered by insurance.  Pets:Dog which lives outside the house. No pets, exotic pets. Occupation:Customer sevice rep. previously worked in [employer] Exposures: His current dust exposure and previous exposure to cotton fiber in her line of work Smoking history: 35-pack-year smoking history. Continues to smoke 1 pack per day  Interim history: Continues on Trelegy inhaler.  Has chronic dyspnea, cough with white mucus.  Denies any fevers, chills Continues to smoke.  Outpatient Encounter Medications as of 12/09/2019  Medication Sig  . albuterol (PROVENTIL HFA;VENTOLIN HFA) 108 (90 Base) MCG/ACT inhaler Inhale 2 puffs into the lungs every 6 (six) hours as needed for wheezing or shortness of breath.  . ALPRAZolam (XANAX) 0.5 MG tablet Take 1 tablet by mouth twice daily as needed for anxiety  . baclofen (LIORESAL) 10 MG tablet Take 1 tablet by mouth twice daily  . busPIRone (BUSPAR) 30 MG tablet Take 1 tablet (30 mg total) by mouth 3 (three) times daily.  . cetirizine (ZYRTEC) 10 MG tablet Take 1 tablet (10 mg total) by mouth daily.  Marland Kitchen diltiazem (CARDIZEM) 30 MG tablet Take 1 tablet by mouth twice daily as needed  . doxepin (SINEQUAN) 25 MG capsule Take 1-2 capsules (25-50 mg total) by mouth at bedtime.  . DULoxetine (CYMBALTA) 60 MG capsule Take 2 capsules (120  mg total) by mouth daily.  Marland Kitchen EPINEPHrine 0.3 mg/0.3 mL IJ SOAJ injection Inject 0.3 mLs (0.3 mg total) into the muscle as needed for anaphylaxis.  Marland Kitchen HYDROcodone-homatropine (HYCODAN) 5-1.5 MG/5ML syrup Take 5 mLs by mouth every 6 (six) hours as needed for cough.  Marland Kitchen ipratropium-albuterol (DUONEB) 0.5-2.5 (3) MG/3ML SOLN Take 3 mLs every 6 (six) hours as needed by nebulization.  . meclizine (ANTIVERT) 25 MG tablet TAKE 1 TABLET BY MOUTH THREE TIMES DAILY AS NEEDED FOR DIZZINESS  . meloxicam (MOBIC) 7.5 MG tablet Take 1 tablet (7.5 mg total) by mouth daily.  . mirabegron ER (MYRBETRIQ) 25 MG TB24 tablet Take 1 tablet (25 mg total) by mouth daily.  . Multiple Vitamin (MULTIVITAMIN WITH MINERALS) TABS tablet Take 1 tablet by mouth daily.  . nicotine (NICODERM CQ - DOSED IN MG/24 HOURS) 14 mg/24hr patch Place 1 patch (14 mg total) onto the skin daily. Then fill with 7 mg dose for month 2  . predniSONE (STERAPRED UNI-PAK 48 TAB) 10 MG (48) TBPK tablet Take as directed for 12 days  . Respiratory Therapy Supplies (NEBULIZER/TUBING/MOUTHPIECE) KIT 1 Units 4 (four) times daily by Does not apply route. GIVE face mask and tubing for nebulizer  . rOPINIRole (REQUIP) 1 MG tablet TAKE 1 TO 4 TABLETS BY MOUTH AT BEDTIME  . traMADol (ULTRAM) 50 MG tablet Take 1 tablet (50 mg total) by mouth every 6 (six) hours as needed.  Viviana Simpler ELLIPTA 100-62.5-25  MCG/INH AEPB Inhale 1 Inhaler into the lungs daily.  . [DISCONTINUED] levofloxacin (LEVAQUIN) 500 MG tablet Take 1 tablet (500 mg total) by mouth daily.   No facility-administered encounter medications on file as of 12/09/2019.   Physical Exam: Blood pressure 122/82, pulse 80, temperature 97.6 F (36.4 C), temperature source Temporal, height 5' 4.5" (1.638 m), weight 171 lb (77.6 kg), SpO2 99 %. Gen:      No acute distress HEENT:  EOMI, sclera anicteric Neck:     No masses; no thyromegaly Lungs:    Clear to auscultation bilaterally; normal respiratory effort CV:          Regular rate and rhythm; no murmurs Abd:      + bowel sounds; soft, non-tender; no palpable masses, no distension Ext:    No edema; adequate peripheral perfusion Skin:      Warm and dry; no rash Neuro: alert and oriented x 3 Psych: normal mood and affect  Data Reviewed: Imaging Chest x-ray 07/22/15-interstitial marking consistent with bronchitis Chest x-ray 11/19/15-chronic interstitial marking Chest x-ray 11/20/15-chronic interstitial marking. Stable compared to prior Chest x-ray 04/25/17-hyperinflation, chronic interstitial marking. No acute process. Screening CT chest 05/08/17-centrilobular emphysema, calcified granuloma, tiny subcentimeter pulmonary nodules. CTA 05/21/2018- emphysema, scattered pulmonary nodule.  4 mm right lower lung nodule is new. I have reviewed the images personally  CTA [facility]) 03/11/2019-no pulmonary embolism.  No active cardiopulmonary disease.  Coronary artery calcification No images available in PACS  PFTs 07/24/17 FVC 2.54 (74%], FEV1 1.55 (58%], F/F 61, TLC 103%, DLCO 64% Moderate obstruction and diffusion impairment with air trapping  Labs CBC/1/17-absolute eosinophil count 0 Alpha-1 antitrypsin 04/25/2017-136, PI MM  Assessment:  COPD, chronic bronchitis Currently on trelegy, albuterol Supplemental oxygen nebulizers as needed.  Active smoker Continues to smoke heavily.  Smoking cessation discussed in detail We will be reasonable to try nicotine and Chantix.  Recent guidelines from ATS indicate that Chantix is well-tolerated in patients with stable depression and on appropriate treatment Reassess at return visit.  Time spent counseling-5 minutes  Subcentimeter pulmonary nodule Not reported on CT last year Referral for low-dose screening CT of the chest.   Restless leg syndrome, suspected sleep apnea On requip for restless leg syndrome.  Sleep study not done as it is not covered by insurance.  Plan/Recommendations: - Continue trelegy  duo nebs as needed - Low dose screening CT of the chest - Smoking cessation. Nicotine patches and Chantix  Marshell Garfinkel MD  Pulmonary and Critical Care 12/09/2019, 10:14 AM  CC: Terald Sleeper, PA-C

## 2019-12-11 ENCOUNTER — Other Ambulatory Visit: Payer: Self-pay | Admitting: Orthopaedic Surgery

## 2019-12-11 ENCOUNTER — Telehealth: Payer: Self-pay | Admitting: Pulmonary Disease

## 2019-12-11 ENCOUNTER — Other Ambulatory Visit: Payer: Self-pay | Admitting: Physician Assistant

## 2019-12-11 DIAGNOSIS — F331 Major depressive disorder, recurrent, moderate: Secondary | ICD-10-CM

## 2019-12-11 DIAGNOSIS — J441 Chronic obstructive pulmonary disease with (acute) exacerbation: Secondary | ICD-10-CM

## 2019-12-11 MED ORDER — HYDROCODONE-HOMATROPINE 5-1.5 MG/5ML PO SYRP: 5.0000 mL | ORAL_SOLUTION | Freq: Four times a day (QID) | ORAL | 0 refills | Status: DC | PRN

## 2019-12-11 MED ORDER — IPRATROPIUM-ALBUTEROL 0.5-2.5 (3) MG/3ML IN SOLN: 3.0000 mL | Freq: Four times a day (QID) | RESPIRATORY_TRACT | 3 refills | Status: DC | PRN

## 2019-12-11 NOTE — Telephone Encounter (Signed)
2 of these 3 have already been sent today.  Address the cough syrup only

## 2019-12-11 NOTE — Telephone Encounter (Signed)
Alicia Sanchez from [facility] responded to O2 order that clinician would contact our office.  I don't see phone message.  I will call [facility].

## 2019-12-11 NOTE — Telephone Encounter (Signed)
I called her to discuss, did not answer.

## 2019-12-11 NOTE — Telephone Encounter (Signed)
Spoke with pt, she is requesting the medication that goes into her nebulizer. She states she received the nebulizer but not the medication. According to the notes, she was prescribed duonebs so I sent the Rx to her pharmacy. She also states they didn't get the order for the oxygen but in the referral tab it shows the order was sent to [facility]. PCC's can you check on this?

## 2019-12-11 NOTE — Telephone Encounter (Signed)
This is a MY pt? 

## 2019-12-11 NOTE — Telephone Encounter (Signed)
Called [facility] Allegra and spoke to [place].  There was a question as to whether pt was getting O2 thru [facility] and they are not taking transfers at this time.  She went ahead and stated they do have pocs.  She is going to fax me an order for PM to sign on Monday and send back to her and they will service pt.  She states [facility] will have to pick up their tanks.  I called pt & she states [facility] picked up tanks last week.  I told her Christoper Allegra sending form for PM to sign on Monday and I will send back to [facility] & then they should contact her.  Called [facility] & told Lawson Fiscal that [facility] has already picked up tanks.

## 2019-12-11 NOTE — Telephone Encounter (Signed)
I called patient. The system shows that Hycodan has already been sent to the pharmacy by PCP. Patient states that [facility] did not have that last time. I advised that she could not get both. She stated that she could hold off on the cough medication and I explained that this is not something I can call and cancel since you did not prescribe it. She would like to know if you can give her anything stronger than tramadol as that does not really help. I told patient that I would ask you, however, before anything else could be prescribed, the hycodan cough syrup would have to be cancelled.   Please advise.

## 2019-12-11 NOTE — Telephone Encounter (Signed)
Call pt and let her know she cannot have hycodan syrup AND ultram. One or the other.

## 2019-12-11 NOTE — Telephone Encounter (Signed)
Please advise 

## 2019-12-14 ENCOUNTER — Telehealth: Payer: Self-pay | Admitting: Pulmonary Disease

## 2019-12-14 ENCOUNTER — Other Ambulatory Visit: Payer: Self-pay | Admitting: *Deleted

## 2019-12-14 DIAGNOSIS — F17210 Nicotine dependence, cigarettes, uncomplicated: Secondary | ICD-10-CM

## 2019-12-14 NOTE — Telephone Encounter (Signed)
lmtcb for pt. Chart reflects that pt is on Trelegy, not Symbicort.   She does not need to be taking Symbicort if she uses Trelegy- need to clarify.

## 2019-12-14 NOTE — Telephone Encounter (Addendum)
PM signed form and I have faxed back to [facility].  Called [facility] and spoke to Chrystal & made her aware order has been signed and faxed.  Spoke to pt & made her aware form has been faxed to [facility].  Nothing further needed.

## 2019-12-14 NOTE — Telephone Encounter (Signed)
When I called the patient to schedule her lung cancer screening CT. She wanted to let Dr. Isaiah Serge know that she has symbicort that she is using not Proair which is what she thought she was using. Would you please update her med list

## 2019-12-14 NOTE — Telephone Encounter (Signed)
Fax received from [facility] and I gave it to give Morrie Sheldon Alcario Drought is off today) to give to PM to sign/date.

## 2019-12-17 ENCOUNTER — Ambulatory Visit (HOSPITAL_COMMUNITY)
Admission: RE | Admit: 2019-12-17 | Discharge: 2019-12-17 | Disposition: A | Discharge status: Home / Self Care | Payer: 59 | Source: Ambulatory Visit | Attending: Orthopaedic Surgery | Admitting: Orthopaedic Surgery

## 2019-12-17 ENCOUNTER — Ambulatory Visit (HOSPITAL_COMMUNITY)
Admission: RE | Admit: 2019-12-17 | Discharge: 2019-12-17 | Disposition: A | Discharge status: Home / Self Care | Payer: 59 | Source: Ambulatory Visit | Attending: Acute Care | Admitting: Acute Care

## 2019-12-17 ENCOUNTER — Other Ambulatory Visit: Payer: Self-pay

## 2019-12-17 DIAGNOSIS — M4802 Spinal stenosis, cervical region: Secondary | ICD-10-CM | POA: Diagnosis not present

## 2019-12-17 DIAGNOSIS — F17210 Nicotine dependence, cigarettes, uncomplicated: Secondary | ICD-10-CM | POA: Insufficient documentation

## 2019-12-18 ENCOUNTER — Telehealth: Payer: Self-pay | Admitting: Pulmonary Disease

## 2019-12-18 NOTE — Telephone Encounter (Signed)
Called and spoke with patient.  Patient stated she received a POC from [facility], and delivery person told her she could use it for a home concentrator. Called and spoke with Christa See.  Archie Patten stated Patient got a POC that is also used as a Clinical research associate.  Archie Patten stated she would have soemone reach out to Patient this afternoon to make sure Patient has right equipment and understands how to use it. Called and made Patient aware that [facility] Allegra will be reaching out to Patient this afternoon.  Nothing further at this time.

## 2019-12-21 NOTE — Progress Notes (Signed)
11/2020. Please let them  know there was notation of CAD on their  scan.  Please remind the patient  that this is a non-gated exam therefore degree or severity of disease  cannot be determined. Please have them  follow up with their PCP regarding potential risk factor modification, dietary therapy or pharmacologic therapy if clinically indicated. Pt.  is  not currently on statin therapy. Please place order for annual  screening scan for  1/2-22 and fax results to PCP. Thanks so much.  Also, please ley her know to follow up with PCP about the CAD findings. Thanks so much

## 2019-12-22 ENCOUNTER — Telehealth: Payer: Self-pay | Admitting: Orthopaedic Surgery

## 2019-12-22 NOTE — Telephone Encounter (Signed)
noted 

## 2019-12-22 NOTE — Telephone Encounter (Signed)
Returned patient's call in reference to scheduling cervical fusion with Dr. Ophelia Charter.  Patient's clearances have expired.  She will need updated clearance from Dr. Juanetta Gosling or current pulmonologist. Patient was cleared from both a medical and cardiac standpoint back in May.  I have left direct number on her voice mail  and ask that she call me to discuss a surgery date.

## 2019-12-24 ENCOUNTER — Ambulatory Visit (INDEPENDENT_AMBULATORY_CARE_PROVIDER_SITE_OTHER): Payer: 59 | Admitting: Orthopaedic Surgery

## 2019-12-24 ENCOUNTER — Encounter: Payer: Self-pay | Admitting: Orthopaedic Surgery

## 2019-12-24 ENCOUNTER — Other Ambulatory Visit: Payer: Self-pay | Admitting: *Deleted

## 2019-12-24 VITALS — Ht 64.5 in | Wt 170.0 lb

## 2019-12-24 DIAGNOSIS — M4722 Other spondylosis with radiculopathy, cervical region: Secondary | ICD-10-CM | POA: Diagnosis not present

## 2019-12-24 DIAGNOSIS — F17210 Nicotine dependence, cigarettes, uncomplicated: Secondary | ICD-10-CM

## 2019-12-24 DIAGNOSIS — M4802 Spinal stenosis, cervical region: Secondary | ICD-10-CM

## 2019-12-24 NOTE — Progress Notes (Signed)
Office Visit Note   Patient: Alicia Sanchez           Date of Birth: 02-Apr-1961           MRN: [id number] Visit Date: 12/24/2019              Requested by: Terald Sleeper, PA-C [address] [place],  [place] [address] PCP: Terald Sleeper, PA-C   Assessment & Plan: Visit Diagnoses:  1. Foraminal stenosis of cervical region   2. Other spondylosis with radiculopathy, cervical region     Plan: Patient states she is ready to proceed with surgical intervention.  We discussed she would require preoperative clearance from her pulmonologist Dr. Vaughan Browner.  Plan would be two-level cervical fusion C4-5, C5-6 with overnight stay with a history of pulmonary problems.  Allograft and plate was discussed risk of pseudoarthrosis dysphagia dysphonia discussed.  She understands she had been a soft collar for 6 weeks.  Minimal changes at other levels we discussed she might have progression at those levels in years to come.  Questions were elicited and answered she requests we proceed.  Smoking cessation discussed.  Follow-Up Instructions: No follow-ups on file.   Orders:  No orders of the defined types were placed in this encounter.  No orders of the defined types were placed in this encounter.     Procedures: No procedures performed   Clinical Data: No additional findings.   Subjective: Chief Complaint  Patient presents with  . Neck - Follow-up, Pain    MRI Cervical Spine Review    HPI 59 year old female returns with chronic neck and left shoulder pain that radiates to the left side of her hand.  She has been through anti-inflammatories has had MRI over 1 year ago on recent MRI which shows C4-5, C5-6 spondylosis with moderate foraminal narrowing on the left at both levels consistent with her symptoms.  Patient has chronic pulmonary problems COPD and has been working on trying to quit smoking smoking 2 cigarettes a day.  She is on portable oxygen but states she does not use it all the time.   Patient COPD Gold 2.  Past history of UTIs emphysema.  Patient neck symptoms been treated with prednisone Dosepaks anti-inflammatories, physical therapy without relief.  Recently she has been using ibuprofen states it might help just slightly.  Review of Systems previous problems with recurrent UTIs, history of bronchitis emphysema.  Occasional migraines.  Chronic neck pain more than 2 years.  Positive for depression, tobacco abuse.  Lung nodule, stable   Objective: Vital Signs: Ht 5' 4.5" (1.638 m)   Wt 170 lb (77.1 kg)   BMI 28.73 kg/m   Physical Exam Constitutional:      Appearance: She is well-developed.  HENT:     Head: Normocephalic.     Right Ear: External ear normal.     Left Ear: External ear normal.  Eyes:     Pupils: Pupils are equal, round, and reactive to light.  Neck:     Thyroid: No thyromegaly.     Trachea: No tracheal deviation.  Cardiovascular:     Rate and Rhythm: Normal rate.  Pulmonary:     Effort: Pulmonary effort is normal.     Comments:  on portable O2.  No rales or rhonchi.  No respiratory distress. Abdominal:     Palpations: Abdomen is soft.  Skin:    General: Skin is warm and dry.  Neurological:     Mental Status: She is alert  and oriented to person, place, and time.  Psychiatric:        Behavior: Behavior normal.     Ortho Exam patient sits holding the left side of her neck.  She has extreme tenderness of brachial plexus on the left mild on the right.  Upper extremity reflexes are 2+ no lower extremity clonus or hyperreflexia.  Normal heel toe gait.  Biceps triceps is strong decreased sensation radial side of her hand C6 on the left only normal on the right.  No wrist flexion extension weakness.  Positive Spurling on the left negative on the right.  Increased pain with cervical compression no change with distraction.  No impingement of the shoulders elbows reach full extension long head of the biceps nontender and stable right and left.  No  periscapular atrophy no shoulder subluxation.  Specialty Comments:  No specialty comments available.  Imaging: Study Result  CLINICAL DATA:  Neck pain radiating to the left arm over the last 2 years.  EXAM: MRI CERVICAL SPINE WITHOUT CONTRAST  TECHNIQUE: Multiplanar, multisequence MR imaging of the cervical spine was performed. No intravenous contrast was administered.  COMPARISON:  01/29/2019  FINDINGS: Alignment: Normal  Vertebrae: Normal  Cord: No cord compression or primary cord lesion.  Posterior Fossa, vertebral arteries, paraspinal tissues: Normal  Disc levels:  No abnormality at the foramen magnum, C1-2, C2-3 or C3-4.  C4-5: Spondylosis with disc space narrowing, endplate osteophytes and bulging of the disc, more prominent on the left. No compressive canal stenosis. Moderate foraminal narrowing on the left that could possibly affect the C5 nerve.  C5-6: Spondylosis with endplate osteophytes and bulging of the disc. No compressive central canal stenosis. Foraminal narrowing left more than right could possibly affect the left C6 nerve.  C6-7: Endplate osteophytes and bulging of the disc. No compressive canal or foraminal narrowing.  C7-T1: Normal interspace.  IMPRESSION: Degenerative cervical spondylosis C4-5 through C6-7. No compressive canal narrowing. Left foraminal stenosis at C4-5 and C5-6 that could compress the left C5 and C6 nerves. Findings appear similar to the study of March 2020.   Electronically Signed   By: Paulina Fusi M.D.   On: 12/17/2019 13:38      PMFS History: Patient Active Problem List   Diagnosis Date Noted  . Foraminal stenosis of cervical region 02/06/2019  . Other spondylosis with radiculopathy, cervical region 12/21/2018  . Chronic left shoulder pain 11/24/2018  . Arthritis 11/24/2018  . 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 Actinic keratosis 07/17/2018  . Chronic respiratory  failure with hypoxia (HCC) 07/16/2018  . Abnormal findings on diagnostic imaging of lung 06/18/2018  . COPD (chronic obstructive pulmonary disease) (HCC) 05/22/2018  . Acute respiratory failure with hypoxia (HCC)   . Acute cystitis with hematuria 08/21/2017  . Lung nodules 08/21/2017  . GOLD COPD II D 08/21/2017  . Recurrent UTI 08/21/2017  . Generalized anxiety disorder 03/22/2017  . Moderate episode of recurrent major depressive disorder (HCC) 12/16/2016  . Restless legs 12/16/2016  . OAB (overactive bladder) 12/16/2016  . COPD exacerbation (HCC) 11/19/2015  . Acute URI 11/19/2015  . Leukocytosis 11/19/2015  . 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 Tobacco abuse 11/19/2015  . Depression with anxiety 11/19/2015   Past Medical History:  Diagnosis Date  . Anxiety   . COPD (chronic obstructive pulmonary disease) (HCC)     Family History  Problem Relation Age of Onset  . COPD Mother   . COPD Father   . Diabetes type II Sister   .  Diabetes type II Brother   . COPD Maternal Grandfather     Past Surgical History:  Procedure Laterality Date  . ABDOMINAL HYSTERECTOMY     Social History   Occupational History  . Not on file  Tobacco Use  . Smoking status: Current Every Day Smoker    Packs/day: 0.50    Years: 42.00    Pack years: 21.00    Types: Cigarettes  . Smokeless tobacco: Never Used  Substance and Sexual Activity  . Alcohol use: No  . Drug use: No  . Sexual activity: Yes    Birth control/protection: Surgical

## 2019-12-25 ENCOUNTER — Telehealth: Payer: Self-pay | Admitting: *Deleted

## 2019-12-25 ENCOUNTER — Other Ambulatory Visit: Payer: Self-pay | Admitting: Physician Assistant

## 2019-12-25 ENCOUNTER — Encounter: Payer: Self-pay | Admitting: Orthopaedic Surgery

## 2019-12-25 DIAGNOSIS — F411 Generalized anxiety disorder: Secondary | ICD-10-CM

## 2019-12-25 NOTE — Telephone Encounter (Signed)
Thanks for info, Alicia Sanchez can this patient get a follow up with Alicia Sanchez within the next few weeks    [name] MD   Scheduled virtual appt 2/18 @ 1140am

## 2020-01-05 ENCOUNTER — Other Ambulatory Visit: Payer: Self-pay

## 2020-01-06 ENCOUNTER — Encounter: Payer: Self-pay | Admitting: Physician Assistant

## 2020-01-06 ENCOUNTER — Ambulatory Visit (INDEPENDENT_AMBULATORY_CARE_PROVIDER_SITE_OTHER): Payer: 59 | Admitting: Physician Assistant

## 2020-01-06 VITALS — BP 98/62 | HR 79 | Temp 97.5°F | Ht 64.5 in | Wt 170.0 lb

## 2020-01-06 DIAGNOSIS — F411 Generalized anxiety disorder: Secondary | ICD-10-CM

## 2020-01-06 DIAGNOSIS — F331 Major depressive disorder, recurrent, moderate: Secondary | ICD-10-CM

## 2020-01-06 DIAGNOSIS — J42 Unspecified chronic bronchitis: Secondary | ICD-10-CM | POA: Diagnosis not present

## 2020-01-06 DIAGNOSIS — Z0000 Encounter for general adult medical examination without abnormal findings: Secondary | ICD-10-CM

## 2020-01-06 DIAGNOSIS — N3281 Overactive bladder: Secondary | ICD-10-CM | POA: Diagnosis not present

## 2020-01-06 DIAGNOSIS — F418 Other specified anxiety disorders: Secondary | ICD-10-CM

## 2020-01-06 DIAGNOSIS — R0683 Snoring: Secondary | ICD-10-CM

## 2020-01-06 DIAGNOSIS — M8580 Other specified disorders of bone density and structure, unspecified site: Secondary | ICD-10-CM

## 2020-01-06 MED ORDER — BACLOFEN 10 MG PO TABS: 10.0000 mg | ORAL_TABLET | Freq: Two times a day (BID) | ORAL | 5 refills | Status: DC

## 2020-01-06 MED ORDER — DOXEPIN HCL 100 MG PO CAPS: 100.0000 mg | ORAL_CAPSULE | Freq: Every day | ORAL | 2 refills | Status: DC

## 2020-01-06 MED ORDER — DULOXETINE HCL 60 MG PO CPEP: 120.0000 mg | ORAL_CAPSULE | Freq: Every day | ORAL | 1 refills | Status: DC

## 2020-01-06 MED ORDER — ALPRAZOLAM 0.5 MG PO TABS: 0.5000 mg | ORAL_TABLET | Freq: Two times a day (BID) | ORAL | 2 refills | Status: DC | PRN

## 2020-01-07 ENCOUNTER — Telehealth (INDEPENDENT_AMBULATORY_CARE_PROVIDER_SITE_OTHER): Payer: 59 | Admitting: Cardiology

## 2020-01-07 ENCOUNTER — Encounter: Payer: Self-pay | Admitting: Physician Assistant

## 2020-01-07 ENCOUNTER — Encounter: Payer: Self-pay | Admitting: Cardiology

## 2020-01-07 VITALS — BP 90/82 | HR 84 | Ht 64.5 in | Wt 170.0 lb

## 2020-01-07 DIAGNOSIS — I2510 Atherosclerotic heart disease of native coronary artery without angina pectoris: Secondary | ICD-10-CM | POA: Diagnosis not present

## 2020-01-07 DIAGNOSIS — R002 Palpitations: Secondary | ICD-10-CM

## 2020-01-07 DIAGNOSIS — R079 Chest pain, unspecified: Secondary | ICD-10-CM

## 2020-01-07 LAB — CMP14+EGFR
ALT: 14 IU/L (ref 0–32)
AST: 21 IU/L (ref 0–40)
Albumin/Globulin Ratio: 1.9 (ref 1.2–2.2)
Albumin: 4.6 g/dL (ref 3.8–4.9)
Alkaline Phosphatase: 80 IU/L (ref 39–117)
BUN/Creatinine Ratio: 12 (ref 9–23)
BUN: 10 mg/dL (ref 6–24)
Bilirubin Total: 0.3 mg/dL (ref 0.0–1.2)
CO2: 22 mmol/L (ref 20–29)
Calcium: 9.8 mg/dL (ref 8.7–10.2)
Chloride: 100 mmol/L (ref 96–106)
Creatinine, Ser: 0.82 mg/dL (ref 0.57–1.00)
GFR calc Af Amer: 91 mL/min/{1.73_m2} (ref >59)
GFR calc non Af Amer: 79 mL/min/{1.73_m2} (ref >59)
Globulin, Total: 2.4 g/dL (ref 1.5–4.5)
Glucose: 72 mg/dL (ref 65–99)
Potassium: 4.3 mmol/L (ref 3.5–5.2)
Sodium: 140 mmol/L (ref 134–144)
Total Protein: 7 g/dL (ref 6.0–8.5)

## 2020-01-07 LAB — CBC WITH DIFFERENTIAL/PLATELET
Basophils Absolute: 0 10*3/uL (ref 0.0–0.2)
Basos: 1 %
EOS (ABSOLUTE): 0.1 10*3/uL (ref 0.0–0.4)
Eos: 1 %
Hematocrit: 47.7 % — ABNORMAL HIGH (ref 34.0–46.6)
Hemoglobin: 16.3 g/dL — ABNORMAL HIGH (ref 11.1–15.9)
Immature Grans (Abs): 0 10*3/uL (ref 0.0–0.1)
Immature Granulocytes: 0 %
Lymphocytes Absolute: 2.8 10*3/uL (ref 0.7–3.1)
Lymphs: 32 %
MCH: 30.2 pg (ref 26.6–33.0)
MCHC: 34.2 g/dL (ref 31.5–35.7)
MCV: 88 fL (ref 79–97)
Monocytes Absolute: 0.8 10*3/uL (ref 0.1–0.9)
Monocytes: 9 %
Neutrophils Absolute: 4.9 10*3/uL (ref 1.4–7.0)
Neutrophils: 57 %
Platelets: 425 10*3/uL (ref 150–450)
RBC: 5.4 x10E6/uL — ABNORMAL HIGH (ref 3.77–5.28)
RDW: 12.8 % (ref 11.7–15.4)
WBC: 8.7 10*3/uL (ref 3.4–10.8)

## 2020-01-07 LAB — LIPID PANEL
Chol/HDL Ratio: 3.2 ratio (ref 0.0–4.4)
Cholesterol, Total: 199 mg/dL (ref 100–199)
HDL: 63 mg/dL (ref >39)
LDL Chol Calc (NIH): 108 mg/dL — ABNORMAL HIGH (ref 0–99)
Triglycerides: 162 mg/dL — ABNORMAL HIGH (ref 0–149)
VLDL Cholesterol Cal: 28 mg/dL (ref 5–40)

## 2020-01-07 LAB — TSH: TSH: 1.53 u[IU]/mL (ref 0.450–4.500)

## 2020-01-07 MED ORDER — METOPROLOL TARTRATE 100 MG PO TABS: ORAL_TABLET | ORAL | 0 refills | Status: DC

## 2020-01-07 NOTE — Progress Notes (Signed)
Virtual Visit via Telephone Note   This visit type was conducted due to national recommendations for restrictions regarding the COVID-19 Pandemic (e.g. social distancing) in an effort to limit this patient's exposure and mitigate transmission in our community.  Due to her co-morbid illnesses, this patient is at least at moderate risk for complications without adequate follow up.  This format is felt to be most appropriate for this patient at this time.  The patient did not have access to video technology/had technical difficulties with video requiring transitioning to audio format only (telephone).  All issues noted in this document were discussed and addressed.  No physical exam could be performed with this format.  Please refer to the patient's chart for her  consent to telehealth for [facility].   Date:  01/07/2020   ID:  Alicia Sanchez, DOB 1961/02/21, MRN [id number]  Patient Location: Home Provider Location: Office  PCP:  Remus Loffler, PA-C  Cardiologist:  Dina Rich, MD  Electrophysiologist:  None   Evaluation Performed:  Follow-Up Visit  Chief Complaint:  Follow up  History of Present Illness:    Alicia Sanchez is a 59 y.o. female seen today for follow up of the following medical problems.    1. COPD/DOE - 07/2018 PFTs severe COPD - followed by pulmonary Dr Isaiah Serge  - on home O2 2 L  - at last visit unclear if her COPD was the only cause of her SOB, referred for echo 06/2018 echo LVEF 60-65%, normal diastolic function  - breathing is much improved. Has nearly quit smoking. Wears O2 as needed   2. Palpitations -DOE short distances with palpitations. Can occur at rest, particularly at night - lasts a few minutes. Can feel her herself breathing heavy - occurs daily - coffee x 1-2 cups, no tea, diet mountain dews x 3-4, rare etoh. Ongoing a few years.  - can be better with xanax.  - started on coreg < 1 month, symptoms better with beta blocker.  Takes in AM, second dose prn.  06/2018 event monitor benign.   - ongoing palpitations at times. Overal stable - high caffeine intake - takes her prn diltiazem    2. Chest pain - pressure/tightness mid to left chest. Rest or at rest. Mild to moderate pain, lasts about 1 minute - +SOB, can feel anxious - limited exertion due to her chornic COPD.  - recent CT lung cance screen showed CAD    The patient does not have symptoms concerning for COVID-19 infection (fever, chills, cough, or new shortness of breath).    Past Medical History:  Diagnosis Date  . Anxiety   . COPD (chronic obstructive pulmonary disease) (HCC)    Past Surgical History:  Procedure Laterality Date  . ABDOMINAL HYSTERECTOMY       No outpatient medications have been marked as taking for the 01/07/20 encounter (Appointment) with Antoine Poche, MD.     Allergies:   Doxycycline   Social History   Tobacco Use  . Smoking status: Current Every Day Smoker    Packs/day: 0.50    Years: 42.00    Pack years: 21.00    Types: Cigarettes  . Smokeless tobacco: Never Used  Substance Use Topics  . Alcohol use: No  . Drug use: No     Family Hx: The patient's family history includes COPD in her father, maternal grandfather, and mother; Diabetes type II in her brother and sister.  ROS:   Please see the history of present  illness.     All other systems reviewed and are negative.   Prior CV studies:   The following studies were reviewed today:  Jan 2017 Study Conclusions  - Left ventricle: The cavity size was normal. Systolic function was vigorous. The estimated ejection fraction was in the range of 65% to 70%. Wall motion was normal; there were no regional wall motion abnormalities. Doppler parameters are consistent with abnormal left ventricular relaxation (grade 1 diastolic dysfunction). There was no evidence of elevated ventricular filling pressure by Doppler parameters. - Aortic  valve: There was no regurgitation. - Aortic root: The aortic root was normal in size. - Mitral valve: Structurally normal valve. There was mild regurgitation. - Right ventricle: The cavity size was normal. Wall thickness was normal. Systolic function was normal. - Right atrium: The atrium was normal in size. - Tricuspid valve: There was mild regurgitation. - Pulmonic valve: There was no regurgitation. - Pulmonary arteries: Systolic pressure was within the normal range. - Inferior vena cava: The vessel was normal in size. - Pericardium, extracardiac: There was no pericardial effusion.   06/2018 echo Study Conclusions  - Left ventricle: The cavity size was normal. Wall thickness was normal. Systolic function was normal. The estimated ejection fraction was in the range of 60% to 65%. Wall motion was normal; there were no regional wall motion abnormalities. Left ventricular diastolic function parameters were normal. - Aortic valve: Mildly calcified annulus. Probably trileaflet. - Mitral valve: Mildly calcified annulus. There was trivial regurgitation. - Right ventricle: The cavity size was mildly dilated. - Right atrium: Central venous pressure (est): 3 mm Hg. - Atrial septum: No defect or patent foramen ovale was identified. - Tricuspid valve: There was trivial regurgitation. - Pericardium, extracardiac: A prominent pericardial fat pad was present.  06/2018 event monitor  7 day event monitor  Min HR 76, Max HR 143, Avg HR 93  Symptoms correlate with sinus rhythm and sinus tachycardia  No significant arrhythmias  Labs/Other Tests and Data Reviewed:    EKG:  No ECG reviewed.  Recent Labs: 01/06/2020: ALT 14; BUN 10; Creatinine, Ser 0.82; Hemoglobin 16.3; Platelets 425; Potassium 4.3; Sodium 140; TSH 1.530   Recent Lipid Panel Lab Results  Component Value Date/Time   CHOL 199 01/06/2020 12:39 PM   TRIG 162 (H) 01/06/2020 12:39 PM   HDL 63 01/06/2020  12:39 PM   CHOLHDL 3.2 01/06/2020 12:39 PM   LDLCALC 108 (H) 01/06/2020 12:39 PM    Wt Readings from Last 3 Encounters:  01/06/20 170 lb (77.1 kg)  12/24/19 170 lb (77.1 kg)  12/09/19 171 lb (77.6 kg)     Objective:    Vital Signs:   Today's Vitals   01/07/20 0954  BP: 90/82  Pulse: 84  Weight: 170 lb (77.1 kg)  Height: 5' 4.5" (1.638 m)   Body mass index is 28.73 kg/m. Normal affect. Normal speech pattern and tone. Comfortable, no apparent distress. No audible signs of SOB or wheezing.   ASSESSMENT & PLAN:    1. Palpitations - previously benign monitor, continue prn diltiazem  2 coronary artery disease - noted on recent CT scan for lung cancer screening - some nonspecific chest pain symptoms, she has CAD risk factors including smoking history - needs functional assessment of CAD. Exercise limited due to O2 dependent COPD. Would avoid regadenoson given COPD. I think best option would be coronary CTA.  - if confirmed CAD consider daily ASA, possible statin   F/u pending CT findings  COVID-19 Education:  The signs and symptoms of COVID-19 were discussed with the patient and how to seek care for testing (follow up with PCP or arrange E-visit).  The importance of social distancing was discussed today.  Time:   Today, I have spent 22 minutes with the patient with telehealth technology discussing the above problems.     Medication Adjustments/Labs and Tests Ordered: Current medicines are reviewed at length with the patient today.  Concerns regarding medicines are outlined above.   Tests Ordered: No orders of the defined types were placed in this encounter.   Medication Changes: No orders of the defined types were placed in this encounter.   Follow Up:  F/u pending CT findings  Signed, Dina Rich, MD  01/07/2020 8:41 AM    [facility]

## 2020-01-07 NOTE — Addendum Note (Signed)
Addended by: Burman Nieves T on: 01/07/2020 11:34 AM   Modules accepted: Orders

## 2020-01-07 NOTE — Patient Instructions (Addendum)
Your physician recommends that you schedule a follow-up appointment in: PENDING TEST RESULTS WITH DR [facility]   Your physician recommends that you continue on your current medications as directed. Please refer to the Current Medication list given to you today.  Your cardiac CT will be scheduled at one of the below locations:   [facility] [address] [place], [place] [address] [phone]  OR  [facility] [address] [place], [place] [address] [phone]  If scheduled at [facility], please arrive at the [facility] main entrance of [facility] 30 minutes prior to test start time. Proceed to the [facility] Radiology Department (first floor) to check-in and test prep.  If scheduled at [facility], please arrive 15 mins early for check-in and test prep.  Please follow these instructions carefully (unless otherwise directed):   On the Night Before the Test: . Be sure to Drink plenty of water. . Do not consume any caffeinated/decaffeinated beverages or chocolate 12 hours prior to your test. . Do not take any antihistamines 12 hours prior to your test. . If you take Metformin do not take 24 hours prior to test.  On the Day of the Test: . Drink plenty of water. Do not drink any water within one hour of the test. . Do not eat any food 4 hours prior to the test. . You may take your regular medications prior to the test.  . Take 100MG   metoprolol (Lopressor) one hours prior to test. . HOLD Furosemide/Hydrochlorothiazide morning of the test. . FEMALES- please wear underwire-free bra if available         -Drink plenty of water       -Hold Furosemide/hydrochlorothiazide morning of the test       -Take metoprolol (Lopressor) 1 hours prior to test (if applicable).         After the Test: . Drink plenty of water. . After receiving IV contrast, you may experience a mild flushed  feeling. This is normal. . On occasion, you may experience a mild rash up to 24 hours after the test. This is not dangerous. If this occurs, you can take Benadryl 25 mg and increase your fluid intake. . If you experience trouble breathing, this can be serious. If it is severe call 911 IMMEDIATELY. If it is mild, please call our office. . If you take any of these medications: Glipizide/Metformin, Avandament, Glucavance, please do not take 48 hours after completing test unless otherwise instructed.   Once we have confirmed authorization from your insurance company, we will call you to set up a date and time for your test.   For non-scheduling related questions, please contact the cardiac imaging nurse navigator should you have any questions/concerns: , RN Navigator Cardiac Imaging [facility] [phone] mobile

## 2020-01-07 NOTE — Progress Notes (Signed)
Subjective:    Patient ID: Alicia Sanchez, female    DOB: May 18, 1961, 59 y.o.   MRN: [id number]  Chief Complaint  Patient presents with  . Medical Management of Chronic Issues     10m  . COPD  . Fatigue    COPD She complains of cough and wheezing. There is no sputum production. This is a chronic problem. The current episode started more than 1 year ago. The problem has been waxing and waning. Her past medical history is significant for COPD.   The patient also is here for follow-up on her overactive bladder, osteopenia, depression with anxiety and complaint of fatigue.  She states her husband is says that she does snore a lot.  She wakes up every morning very tired.  She is unsure if there is any apnea events.  Regard to make an appointment for sleep study.  Osteopenia we will plan for DEXA scan to be ordered.  She is due.   . Past Medical History:  Diagnosis Date  . Anxiety   . [place]     Past Surgical History:  Procedure Laterality Date  . ABDOMINAL HYSTERECTOMY      Family History  Problem Relation Age of Onset  . COPD Mother   . COPD Father   . Diabetes type II Sister   . Diabetes type II Brother   . COPD Maternal Grandfather     Social History   Socioeconomic History  . Marital status: Married    Spouse name: Not on file  . Number of children: Not on file  . Years of education: Not on file  . Highest education level: Not on file  Occupational History  . Not on file  Tobacco Use  . Smoking status: Current Every Day Smoker    Packs/day: 0.50    Years: 42.00    Pack years: 21.00    Types: Cigarettes  . Smokeless tobacco: Never Used  Substance and Sexual Activity  . Alcohol use: No  . Drug use: No  . Sexual activity: Yes    Birth control/protection: Surgical  Other Topics Concern  . Not on file  Social History Narrative  . Not on file   Social Determinants of Health   Financial Resource Strain:    . Difficulty of Paying Living Expenses: Not on file  Food Insecurity:   . Worried About RCharity fundraiserin the Last Year: Not on file  . Ran Out of Food in the Last Year: Not on file  Transportation Needs:   . Lack of Transportation (Medical): Not on file  . Lack of Transportation (Non-Medical): Not on file  Physical Activity:   . Days of Exercise per Week: Not on file  . Minutes of Exercise per Session: Not on file  Stress:   . Feeling of Stress : Not on file  Social Connections:   . Frequency of Communication with Friends and Family: Not on file  . Frequency of Social Gatherings with Friends and Family: Not on file  . Attends Religious Services: Not on file  . Active Member of Clubs or Organizations: Not on file  . Attends CArchivistMeetings: Not on file  . Marital Status: Not on file  Intimate Partner Violence:   . Fear of Current or Ex-Partner: Not on file  . Emotionally Abused: Not on file  . Physically Abused: Not on file  . Sexually Abused: Not on file  Outpatient Medications Prior to Visit  Medication Sig Dispense Refill  . albuterol (PROVENTIL HFA;VENTOLIN HFA) 108 (90 Base) MCG/ACT inhaler Inhale 2 puffs into the lungs every 6 (six) hours as needed for wheezing or shortness of breath. 1 Inhaler 6  . busPIRone (BUSPAR) 30 MG tablet Take 1 tablet (30 mg total) by mouth 3 (three) times daily. 90 tablet 5  . cetirizine (ZYRTEC) 10 MG tablet Take 1 tablet (10 mg total) by mouth daily. 30 tablet 11  . diltiazem (CARDIZEM) 30 MG tablet Take 1 tablet by mouth twice daily as needed 180 tablet 0  . EPINEPHrine 0.3 mg/0.3 mL IJ SOAJ injection Inject 0.3 mLs (0.3 mg total) into the muscle as needed for anaphylaxis. 1 Device 2  . HYDROcodone-homatropine (HYCODAN) 5-1.5 MG/5ML syrup Take 5 mLs by mouth every 6 (six) hours as needed for cough. 120 mL 0  . ipratropium-albuterol (DUONEB) 0.5-2.5 (3) MG/3ML SOLN Take 3 mLs by nebulization every 6 (six) hours as needed.  360 mL 3  . meclizine (ANTIVERT) 25 MG tablet TAKE 1 TABLET BY MOUTH THREE TIMES DAILY AS NEEDED FOR DIZZINESS 60 tablet 0  . meloxicam (MOBIC) 7.5 MG tablet Take 1 tablet (7.5 mg total) by mouth daily. 30 tablet 3  . mirabegron ER (MYRBETRIQ) 25 MG TB24 tablet Take 1 tablet (25 mg total) by mouth daily. 30 tablet 6  . Multiple Vitamin (MULTIVITAMIN WITH MINERALS) TABS tablet Take 1 tablet by mouth daily.    . predniSONE (STERAPRED UNI-PAK 48 TAB) 10 MG (48) TBPK tablet Take as directed for 12 days 48 tablet 0  . Respiratory Therapy Supplies (NEBULIZER/TUBING/MOUTHPIECE) KIT 1 Units 4 (four) times daily by Does not apply route. GIVE face mask and tubing for nebulizer 1 each 11  . rOPINIRole (REQUIP) 1 MG tablet TAKE 1 TO 4 TABLETS BY MOUTH AT BEDTIME 360 tablet 3  . TRELEGY ELLIPTA 100-62.5-25 MCG/INH AEPB Inhale 1 Inhaler into the lungs daily.    . varenicline (CHANTIX PAK) 0.5 MG X 11 & 1 MG X 42 tablet Take one 0.5 mg tablet by mouth once daily for 3 days, then increase to one 0.5 mg tablet twice daily for 4 days, then increase to one 1 mg tablet twice daily. 53 tablet 0  . ALPRAZolam (XANAX) 0.5 MG tablet Take 1 tablet by mouth twice daily as needed for anxiety 60 tablet 0  . baclofen (LIORESAL) 10 MG tablet Take 1 tablet by mouth twice daily 40 tablet 0  . doxepin (SINEQUAN) 25 MG capsule TAKE 1 TO 2 CAPSULES BY MOUTH AT BEDTIME 60 capsule 0  . DULoxetine (CYMBALTA) 60 MG capsule Take 2 capsules by mouth once daily 60 capsule 0  . Melatonin 5 MG TABS Take 10 mg by mouth at bedtime.    . nicotine (NICODERM CQ - DOSED IN MG/24 HOURS) 14 mg/24hr patch Place 1 patch (14 mg total) onto the skin daily. Then fill with 7 mg dose for month 2 30 patch 1  . traMADol (ULTRAM) 50 MG tablet Take 1 tablet (50 mg total) by mouth every 6 (six) hours as needed. 10 tablet 0   No facility-administered medications prior to visit.    Allergies  Allergen Reactions  . Doxycycline     Chest pain    Review of  Systems  Constitutional: Negative.   HENT: Negative.   Eyes: Negative.   Respiratory: Positive for cough and wheezing. Negative for sputum production.   Gastrointestinal: Negative.   Genitourinary: Negative.  Objective:    Physical Exam Constitutional:      General: She is not in acute distress.    Appearance: Normal appearance. She is well-developed.  HENT:     Head: Normocephalic and atraumatic.  Cardiovascular:     Rate and Rhythm: Normal rate.  Pulmonary:     Effort: Pulmonary effort is normal.  Skin:    General: Skin is warm and dry.     Findings: No rash.  Neurological:     Mental Status: She is alert and oriented to person, place, and time.     Deep Tendon Reflexes: Reflexes are normal and symmetric.     BP 98/62   Pulse 79   Temp (!) 97.5 F (36.4 C)   Ht 5' 4.5" (1.638 m)   Wt 170 lb (77.1 kg)   SpO2 95%   BMI 28.73 kg/m  Wt Readings from Last 3 Encounters:  01/07/20 170 lb (77.1 kg)  01/06/20 170 lb (77.1 kg)  12/24/19 170 lb (77.1 kg)    Health Maintenance Due  Topic Date Due  . COLONOSCOPY  10/27/2010    There are no preventive care reminders to display for this patient.   Lab Results  Component Value Date   TSH 1.530 01/06/2020   Lab Results  Component Value Date   WBC 8.7 01/06/2020   HGB 16.3 (H) 01/06/2020   HCT 47.7 (H) 01/06/2020   MCV 88 01/06/2020   PLT 425 01/06/2020   Lab Results  Component Value Date   NA 140 01/06/2020   K 4.3 01/06/2020   CO2 22 01/06/2020   GLUCOSE 72 01/06/2020   BUN 10 01/06/2020   CREATININE 0.82 01/06/2020   BILITOT 0.3 01/06/2020   ALKPHOS 80 01/06/2020   AST 21 01/06/2020   ALT 14 01/06/2020   PROT 7.0 01/06/2020   ALBUMIN 4.6 01/06/2020   CALCIUM 9.8 01/06/2020   ANIONGAP 9 05/23/2018   Lab Results  Component Value Date   CHOL 199 01/06/2020   Lab Results  Component Value Date   HDL 63 01/06/2020   Lab Results  Component Value Date   LDLCALC 108 (H) 01/06/2020   Lab  Results  Component Value Date   TRIG 162 (H) 01/06/2020   Lab Results  Component Value Date   CHOLHDL 3.2 01/06/2020   No results found for: HGBA1C     Assessment & Plan:   Problem List Items Addressed This Visit      Respiratory   COPD (chronic obstructive pulmonary disease) ([place]) - Primary     Genitourinary   OAB (overactive bladder)     Other   Depression with anxiety   Relevant Medications   ALPRAZolam (XANAX) 0.5 MG tablet   doxepin (SINEQUAN) 100 MG capsule   DULoxetine (CYMBALTA) 60 MG capsule   Moderate episode of recurrent major depressive disorder (HCC)   Relevant Medications   ALPRAZolam (XANAX) 0.5 MG tablet   doxepin (SINEQUAN) 100 MG capsule   DULoxetine (CYMBALTA) 60 MG capsule   Generalized anxiety disorder   Relevant Medications   ALPRAZolam (XANAX) 0.5 MG tablet   doxepin (SINEQUAN) 100 MG capsule   DULoxetine (CYMBALTA) 60 MG capsule    Other Visit Diagnoses    Well adult exam       Snoring           Meds ordered this encounter  Medications  . ALPRAZolam (XANAX) 0.5 MG tablet    Sig: Take 1 tablet (0.5 mg total) by mouth 2 (  two) times daily as needed. for anxiety    Dispense:  60 tablet    Refill:  2    Order Specific Question:   Supervising Provider    Answer:   Brooke Bonito  . baclofen (LIORESAL) 10 MG tablet    Sig: Take 1 tablet (10 mg total) by mouth 2 (two) times daily.    Dispense:  40 tablet    Refill:  5    Order Specific Question:   Supervising Provider    Answer:   Brooke Bonito  . doxepin (SINEQUAN) 100 MG capsule    Sig: Take 1 capsule (100 mg total) by mouth at bedtime.    Dispense:  30 capsule    Refill:  2    Order Specific Question:   Supervising Provider    Answer:   Brooke Bonito  . DULoxetine (CYMBALTA) 60 MG capsule    Sig: Take 2 capsules (120 mg total) by mouth daily.    Dispense:  180 capsule    Refill:  1    Order Specific Question:   Supervising Provider    Answer:    Terald Sleeper [[id number]]     Terald Sleeper, PA-C

## 2020-01-08 ENCOUNTER — Other Ambulatory Visit: Payer: Self-pay | Admitting: Physician Assistant

## 2020-01-09 ENCOUNTER — Other Ambulatory Visit: Payer: Self-pay

## 2020-01-11 ENCOUNTER — Other Ambulatory Visit: Payer: Self-pay | Admitting: Pulmonary Disease

## 2020-01-11 DIAGNOSIS — Z720 Tobacco use: Secondary | ICD-10-CM

## 2020-01-11 MED ORDER — DILTIAZEM HCL 30 MG PO TABS: 30.0000 mg | ORAL_TABLET | Freq: Two times a day (BID) | ORAL | 2 refills | Status: DC | PRN

## 2020-01-12 ENCOUNTER — Telehealth: Payer: Self-pay | Admitting: Pulmonary Disease

## 2020-01-12 ENCOUNTER — Other Ambulatory Visit: Payer: Self-pay | Admitting: *Deleted

## 2020-01-12 DIAGNOSIS — M1990 Unspecified osteoarthritis, unspecified site: Secondary | ICD-10-CM

## 2020-01-12 MED ORDER — MELOXICAM 7.5 MG PO TABS: 7.5000 mg | ORAL_TABLET | Freq: Every day | ORAL | 5 refills | Status: DC

## 2020-01-12 NOTE — Telephone Encounter (Signed)
Dr. Mannam, please advise on this. 

## 2020-01-13 NOTE — Telephone Encounter (Signed)
Yes. That should be fine to change chantix order to dose pack

## 2020-01-13 NOTE — Telephone Encounter (Signed)
[facility] and spoke with Angie. Informed her of the change from Chantix 1mg  to dose pack. Angie verbalized understanding and denied any further questions or concerns at this time.

## 2020-01-15 ENCOUNTER — Other Ambulatory Visit: Payer: Self-pay | Admitting: Physician Assistant

## 2020-01-15 DIAGNOSIS — J441 Chronic obstructive pulmonary disease with (acute) exacerbation: Secondary | ICD-10-CM

## 2020-01-15 MED ORDER — HYDROCODONE-HOMATROPINE 5-1.5 MG/5ML PO SYRP: 5.0000 mL | ORAL_SOLUTION | Freq: Four times a day (QID) | ORAL | 0 refills | Status: DC | PRN

## 2020-01-19 ENCOUNTER — Other Ambulatory Visit: Payer: Self-pay

## 2020-01-19 ENCOUNTER — Ambulatory Visit (INDEPENDENT_AMBULATORY_CARE_PROVIDER_SITE_OTHER): Payer: 59 | Admitting: Neurology

## 2020-01-19 ENCOUNTER — Encounter: Payer: Self-pay | Admitting: Neurology

## 2020-01-19 VITALS — BP 108/68 | HR 68 | Temp 97.4°F | Ht 64.5 in | Wt 174.0 lb

## 2020-01-19 DIAGNOSIS — J441 Chronic obstructive pulmonary disease with (acute) exacerbation: Secondary | ICD-10-CM | POA: Diagnosis not present

## 2020-01-19 DIAGNOSIS — Z720 Tobacco use: Secondary | ICD-10-CM | POA: Diagnosis not present

## 2020-01-19 DIAGNOSIS — J069 Acute upper respiratory infection, unspecified: Secondary | ICD-10-CM | POA: Diagnosis not present

## 2020-01-19 DIAGNOSIS — R0683 Snoring: Secondary | ICD-10-CM

## 2020-01-19 DIAGNOSIS — F411 Generalized anxiety disorder: Secondary | ICD-10-CM

## 2020-01-19 DIAGNOSIS — F5104 Psychophysiologic insomnia: Secondary | ICD-10-CM | POA: Diagnosis not present

## 2020-01-19 DIAGNOSIS — Z9981 Dependence on supplemental oxygen: Secondary | ICD-10-CM

## 2020-01-19 DIAGNOSIS — H510 Palsy (spasm) of conjugate gaze: Secondary | ICD-10-CM

## 2020-01-19 NOTE — Progress Notes (Signed)
SLEEP MEDICINE CLINIC    Provider:  Larey Seat, MD  Primary Care Physician:  Terald Sleeper, PA-C [place] [address]     Referring Provider: Terald Sleeper, Pa-c [place],  [place] [address]          Chief Complaint according to patient   Patient presents with:    . New Patient (Initial Visit)     pt with daughter, rm 17. pt has difficulty with sleeping. she had a SS >5 yrs ago but unsure of those results. she has been told she snores and talks in sleep. she states she has fatigue during the day.      HISTORY OF PRESENT ILLNESS:  Alicia Sanchez is a 59 year old  Caucasian female patient seen here upon referral on 01/19/2020 from [facility]. The notes do not state why she is referred.    Chief concern according to patient : "I have chronic Insomnia for most of my life" and I had a sleep study done many years ago,    I have the pleasure of seeing Alicia Sanchez today, 01-19-2020-a right handed Caucasian female with a possible sleep disorder. She   has a medical history of Anxiety and COPD (chronic obstructive pulmonary disease) ([place]).  She also is treated by Donaciano Eva for overactive bladder, depression with anxiety, she is on Xanax, doxepin, duloxetine she also has episodic and recurrent major depressive disorder the same medications applied, generalized anxiety disorder the same medications applied.  The patient reports that she was recently discovered she has elevated cholesterol levels.  She has gained weight and her BMI is now 23 which places her into the obese but not a morbidly obese category.  Her cardiologist recently referred her for a CT angiogram of the heart which has not been performed yet.  The work-up is for coronary artery disease.  The patient is carrying oxygen with her at 2 L a minute.  Oxygen dependent is not noted in her medical history. She reports a head injury at age 1 years. She has a lazy eye - right eye -Strabism.    Sleep  relevant medical history: Nocturia: 3, insomnia, oxygen needed with physical activity. Very dry mouth . cervical spine DDD,  Family medical /sleep history: mother with OSA, insomnia.    Social history:    GED. Patient is disabled/ retired from Glass blower/designer and lives in a household with spouse .  She has an adult daughter, and young adult grandchildren. The patient currently owns no pets.  Tobacco use active for 40 years , cut down, did not quit.  ETOH use : none , Caffeine intake in form of Coffee( 1 cup in AM ) Soda( 2 a day) Tea ( none ) or energy drinks. Regular exercise; none .   Hobbies : sewing.       Sleep habits are as follows: The patient's dinner time is between 5-6  PM. She snacks all night- she eats in bed. The patient goes to bed at 11 PM and stays up - tossing and turning -she goes to sleep for 2-3 hours, wakes for nocturia.  Breaks.    The preferred sleep position is side ways, with the support of 2 pillows. Elevated head of bed hurst her neck.  Dreams are reportedly frequent/vivid since being on chantix. Averaging 6 hours of sleep.  Marland Kitchen   1-12 noon AM is the usual rise time.  The patient wakes  up spontaneously with an alarm.  She reports not feeling refreshed or restored in AM, with symptoms such as dry mouth, morning headaches, and residual fatigue. Naps are taken infrequently, lasting from 15-30 minutes and are more refreshing than nocturnal sleep.    Review of Systems: Out of a complete 14 system review, the patient complains of only the following symptoms, and all other reviewed systems are negative.:  Fatigue, sleepiness , snoring, fragmented sleep, Insomnia - CHRONIC and described as lifelong. No sleep hygiene .   How likely are you to doze in the following situations: 0 = not likely, 1 = slight chance, 2 = moderate chance, 3 = high chance   Sitting and Reading? Watching Television? Sitting inactive in a public place (theater or meeting)? As a passenger in a car  for an hour without a break? Lying down in the afternoon when circumstances permit? Sitting and talking to someone? Sitting quietly after lunch without alcohol? In a car, while stopped for a few minutes in traffic?   Total = 12/ 24 points   FSS endorsed at 63/ 63 points.   Social History   Socioeconomic History  . Marital status: Married    Spouse name: Not on file  . Number of children: Not on file  . Years of education: Not on file  . Highest education level: Not on file  Occupational History  . Not on file  Tobacco Use  . Smoking status: Current Every Day Smoker    Packs/day: 0.50    Years: 42.00    Pack years: 21.00    Types: Cigarettes  . Smokeless tobacco: Never Used  Substance and Sexual Activity  . Alcohol use: No  . Drug use: No  . Sexual activity: Yes    Birth control/protection: Surgical  Other Topics Concern  . Not on file  Social History Narrative  . Not on file   Social Determinants of Health   Financial Resource Strain:   . Difficulty of Paying Living Expenses: Not on file  Food Insecurity:   . Worried About Charity fundraiser in the Last Year: Not on file  . Ran Out of Food in the Last Year: Not on file  Transportation Needs:   . Lack of Transportation (Medical): Not on file  . Lack of Transportation (Non-Medical): Not on file  Physical Activity:   . Days of Exercise per Week: Not on file  . Minutes of Exercise per Session: Not on file  Stress:   . Feeling of Stress : Not on file  Social Connections:   . Frequency of Communication with Friends and Family: Not on file  . Frequency of Social Gatherings with Friends and Family: Not on file  . Attends Religious Services: Not on file  . Active Member of Clubs or Organizations: Not on file  . Attends Archivist Meetings: Not on file  . Marital Status: Not on file    Family History  Problem Relation Age of Onset  . COPD Mother   . COPD Father   . Diabetes type II Sister   . Diabetes  type II Brother   . COPD Maternal Grandfather     Past Medical History:  Diagnosis Date  . Anxiety   . COPD (chronic obstructive pulmonary disease) ([place])     Past Surgical History:  Procedure Laterality Date  . ABDOMINAL HYSTERECTOMY       Current Outpatient Medications on File Prior to Visit  Medication Sig Dispense Refill  .  albuterol (PROVENTIL HFA;VENTOLIN HFA) 108 (90 Base) MCG/ACT inhaler Inhale 2 puffs into the lungs every 6 (six) hours as needed for wheezing or shortness of breath. 1 Inhaler 6  . ALPRAZolam (XANAX) 0.5 MG tablet Take 1 tablet (0.5 mg total) by mouth 2 (two) times daily as needed. for anxiety 60 tablet 2  . baclofen (LIORESAL) 10 MG tablet Take 1 tablet by mouth twice daily 40 tablet 0  . budesonide-formoterol (SYMBICORT) 160-4.5 MCG/ACT inhaler Inhale 2 puffs into the lungs 2 (two) times daily.    . busPIRone (BUSPAR) 30 MG tablet Take 1 tablet (30 mg total) by mouth 3 (three) times daily. 90 tablet 5  . cetirizine (ZYRTEC) 10 MG tablet Take 1 tablet (10 mg total) by mouth daily. 30 tablet 11  . diltiazem (CARDIZEM) 30 MG tablet Take 1 tablet (30 mg total) by mouth 2 (two) times daily as needed. 180 tablet 2  . doxepin (SINEQUAN) 100 MG capsule Take 1 capsule (100 mg total) by mouth at bedtime. 30 capsule 2  . DULoxetine (CYMBALTA) 60 MG capsule Take 2 capsules (120 mg total) by mouth daily. 180 capsule 1  . EPINEPHrine 0.3 mg/0.3 mL IJ SOAJ injection Inject 0.3 mLs (0.3 mg total) into the muscle as needed for anaphylaxis. 1 Device 2  . HYDROcodone-homatropine (HYCODAN) 5-1.5 MG/5ML syrup Take 5 mLs by mouth every 6 (six) hours as needed for cough. 120 mL 0  . ipratropium-albuterol (DUONEB) 0.5-2.5 (3) MG/3ML SOLN Take 3 mLs by nebulization every 6 (six) hours as needed. 360 mL 3  . meclizine (ANTIVERT) 25 MG tablet TAKE 1 TABLET BY MOUTH THREE TIMES DAILY AS NEEDED FOR DIZZINESS 60 tablet 0  . meloxicam (MOBIC) 7.5 MG tablet Take 1 tablet (7.5 mg total) by  mouth daily. 30 tablet 5  . metoprolol tartrate (LOPRESSOR) 100 MG tablet TAKE 1 TABLET ONE HOUR PRIOR TO TEST 1 tablet 0  . mirabegron ER (MYRBETRIQ) 25 MG TB24 tablet Take 1 tablet (25 mg total) by mouth daily. 30 tablet 6  . Multiple Vitamin (MULTIVITAMIN WITH MINERALS) TABS tablet Take 1 tablet by mouth daily.    Marland Kitchen Respiratory Therapy Supplies (NEBULIZER/TUBING/MOUTHPIECE) KIT 1 Units 4 (four) times daily by Does not apply route. GIVE face mask and tubing for nebulizer 1 each 11  . rOPINIRole (REQUIP) 1 MG tablet TAKE 1 TO 4 TABLETS BY MOUTH AT BEDTIME 360 tablet 3  . TRELEGY ELLIPTA 100-62.5-25 MCG/INH AEPB Inhale 1 Inhaler into the lungs daily.    . varenicline (CHANTIX) 1 MG tablet Take 1 tablet (1 mg total) by mouth 2 (two) times daily. 60 tablet 1   No current facility-administered medications on file prior to visit.    Allergies  Allergen Reactions  . Doxycycline     Chest pain     Alicia Sanchez underwent a chest x-ray on 17 December 2019 it showed no pleural fluid, mild to moderate centrilobular emphysema bilaterally bilateral pulmonary lung nodules noted but unchanged.  Age advanced coronary arteries atherosclerosis is noted.  Aortic atherosclerosis is noted and emphysema. Physical exam:  Today's Vitals   01/19/20 0956  BP: 108/68  Pulse: 68  Temp: (!) 97.4 F (36.3 C)  Weight: 174 lb (78.9 kg)  Height: 5' 4.5" (1.638 m)   Body mass index is 29.41 kg/m.   Wt Readings from Last 3 Encounters:  01/19/20 174 lb (78.9 kg)  01/07/20 170 lb (77.1 kg)  01/06/20 170 lb (77.1 kg)     Ht Readings from Last  3 Encounters:  01/19/20 5' 4.5" (1.638 m)  01/07/20 5' 4.5" (1.638 m)  01/06/20 5' 4.5" (1.638 m)      General: The patient is awake, alert and appears not in acute distress.  On oxygen nasal cannula. Head: Normocephalic, atraumatic.  Strabism.  Neck is supple. Mallampati 3,  neck circumference:15.5  inches . 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  Dental  status:  EDONTOULOUS. Cardiovascular:  Regular rate and cardiac rhythm by pulse,  without distended neck veins. Respiratory: Lungs are clear to auscultation.  Skin:  Without evidence of ankle edema, or rash. Trunk: The patient's posture is erect.   Neurologic exam : The patient is awake and alert, oriented to place and time.   Memory subjective described as intact.  Attention span & concentration ability appears normal.  Speech is fluent,  without  dysarthria, dysphonia or aphasia.  Mood and affect are appropriate.   Cranial nerves: no loss of smell or taste reported  Pupils are equal and briskly reactive to light. Funduscopic exam deferred. Reportedly 20/20 vision in both eyes. (?). .  Extraocular movements in vertical and horizontal planes were intact and without nystagmus. No Diplopia. Visual fields by finger perimetry are intact. Hearing was intact to soft voice and finger rubbing.    Facial sensation intact to fine touch.  Facial motor strength is symmetric and tongue and uvula move midline.  Neck ROM : rotation, tilt and flexion extension were normal for age and shoulder shrug was symmetrical.    Motor exam:  Symmetric bulk, tone and ROM.   Normal tone without cog wheeling, symmetric grip strength .   Sensory:  Fine touch, pinprick and vibration were tested  and  normal.  Proprioception tested in the upper extremities was normal.   Coordination: Rapid alternating movements in the fingers/hands were of normal speed.  The Finger-to-nose maneuver was intact without evidence of ataxia, dysmetria . She has mild action tremor  Bilaterally,  Gait and station: Patient could rise unassisted from a seated position, walked with oxygen. Marland Kitchen  Deep tendon reflexes: in the  upper and lower extremities are symmetric and intact.  Babinski response was deferred.        After spending a total time of 50 minutes face to face and additional time for physical and neurologic examination, review of  laboratory studies,  personal review of imaging studies, reports and results of other testing and review of referral information / records as far as provided in visit, I have established the following assessments:  1)  Chronic insomnia unrelated to physical or organic causes. Present for many years before any illness.   2)   Anxiety and depression have not been followed by a psychiatrist.  3)   Loud snoring and oxygen dependence in COPD, often coughing.    My Plan is to proceed with:  1) sleep hygiene teaching.  2) I will limit my input here to screening for sleep apnea - may overlap with COPD, hypoxemia, and causing  Sleep headaches in AM. 3)  Smoking cessation- addressed by Particia Nearing, with Chantix.   I would like to thankJones, Londell Moh, [place],   [id number] for allowing me to meet with and to take care of this pleasant patient.    I plan to follow up either personally or through our NP within 2-3 month.   CC: I will share my notes with Dr. Learta Codding at [facility]. .  Electronically signed by: Larey Seat, MD  01/19/2020 10:24 AM  [facility] and [employer] certified by [employer] of Sleep Medicine and Diplomate of the [employer] of Sleep Medicine. Board certified In Neurology through the [place], Fellow of the [employer] of Neurology. Medical Director of [employer].

## 2020-01-19 NOTE — Patient Instructions (Signed)
Please remember to try to maintain good sleep hygiene, which means: Keep a regular sleep and wake schedule, try not to exercise or have a meal within 2 hours of your bedtime, try to keep your bedroom conducive for sleep, that is, cool and dark, without light distractors such as an illuminated alarm clock, and refrain from watching TV right before sleep or in the middle of the night and do not keep the TV or radio on during the night. Also, try not to use or play on electronic devices at bedtime, such as your cell phone, tablet PC or laptop. If you like to read at bedtime on an electronic device, try to dim the background light as much as possible. Do not eat in the middle of the night.   We will request a sleep study.    We will look for leg twitching and snoring or sleep apnea.   For chronic insomnia, you are best followed by a psychiatrist and/or sleep psychologist.   We will call you with the sleep study results and make a follow up appointment if needed.     Insomnia Insomnia is a sleep disorder that makes it difficult to fall asleep or stay asleep. Insomnia can cause fatigue, low energy, difficulty concentrating, mood swings, and poor performance at work or school. There are three different ways to classify insomnia:  Difficulty falling asleep.  Difficulty staying asleep.  Waking up too early in the morning. Any type of insomnia can be long-term (chronic) or short-term (acute). Both are common. Short-term insomnia usually lasts for three months or less. Chronic insomnia occurs at least three times a week for longer than three months. What are the causes? Insomnia may be caused by another condition, situation, or substance, such as:  Anxiety.  Certain medicines.  Gastroesophageal reflux disease (GERD) or other gastrointestinal conditions.  Asthma or other breathing conditions.  Restless legs syndrome, sleep apnea, or other sleep disorders.  Chronic  pain.  Menopause.  Stroke.  Abuse of alcohol, tobacco, or illegal drugs.  Mental health conditions, such as depression.  Caffeine.  Neurological disorders, such as Alzheimer's disease.  An overactive thyroid (hyperthyroidism). Sometimes, the cause of insomnia may not be known. What increases the risk? Risk factors for insomnia include:  Gender. Women are affected more often than men.  Age. Insomnia is more common as you get older.  Stress.  Lack of exercise.  Irregular work schedule or working night shifts.  Traveling between different time zones.  Certain medical and mental health conditions. What are the signs or symptoms? If you have insomnia, the main symptom is having trouble falling asleep or having trouble staying asleep. This may lead to other symptoms, such as:  Feeling fatigued or having low energy.  Feeling nervous about going to sleep.  Not feeling rested in the morning.  Having trouble concentrating.  Feeling irritable, anxious, or depressed. How is this diagnosed? This condition may be diagnosed based on:  Your symptoms and medical history. Your health care provider may ask about: ? Your sleep habits. ? Any medical conditions you have. ? Your mental health.  A physical exam. How is this treated? Treatment for insomnia depends on the cause. Treatment may focus on treating an underlying condition that is causing insomnia. Treatment may also include:  Medicines to help you sleep.  Counseling or therapy.  Lifestyle adjustments to help you sleep better. Follow these instructions at home: Eating and drinking   Limit or avoid alcohol, caffeinated beverages, and cigarettes,   especially close to bedtime. These can disrupt your sleep.  Do not eat a large meal or eat spicy foods right before bedtime. This can lead to digestive discomfort that can make it hard for you to sleep. Sleep habits   Keep a sleep diary to help you and your health care  provider figure out what could be causing your insomnia. Write down: ? When you sleep. ? When you wake up during the night. ? How well you sleep. ? How rested you feel the next day. ? Any side effects of medicines you are taking. ? What you eat and drink.  Make your bedroom a dark, comfortable place where it is easy to fall asleep. ? Put up shades or blackout curtains to block light from outside. ? Use a white noise machine to block noise. ? Keep the temperature cool.  Limit screen use before bedtime. This includes: ? Watching TV. ? Using your smartphone, tablet, or computer.  Stick to a routine that includes going to bed and waking up at the same times every day and night. This can help you fall asleep faster. Consider making a quiet activity, such as reading, part of your nighttime routine.  Try to avoid taking naps during the day so that you sleep better at night.  Get out of bed if you are still awake after 15 minutes of trying to sleep. Keep the lights down, but try reading or doing a quiet activity. When you feel sleepy, go back to bed. General instructions  Take over-the-counter and prescription medicines only as told by your health care provider.  Exercise regularly, as told by your health care provider. Avoid exercise starting several hours before bedtime.  Use relaxation techniques to manage stress. Ask your health care provider to suggest some techniques that may work well for you. These may include: ? Breathing exercises. ? Routines to release muscle tension. ? Visualizing peaceful scenes.  Make sure that you drive carefully. Avoid driving if you feel very sleepy.  Keep all follow-up visits as told by your health care provider. This is important. Contact a health care provider if:  You are tired throughout the day.  You have trouble in your daily routine due to sleepiness.  You continue to have sleep problems, or your sleep problems get worse. Get help right  away if:  You have serious thoughts about hurting yourself or someone else. If you ever feel like you may hurt yourself or others, or have thoughts about taking your own life, get help right away. You can go to your nearest emergency department or call:  Your local emergency services (911 in the [place]  A suicide crisis helpline, such as the National Suicide Prevention Lifeline at 1-800-273-8255. This is open 24 hours a day. Summary  Insomnia is a sleep disorder that makes it difficult to fall asleep or stay asleep.  Insomnia can be long-term (chronic) or short-term (acute).  Treatment for insomnia depends on the cause. Treatment may focus on treating an underlying condition that is causing insomnia.  Keep a sleep diary to help you and your health care provider figure out what could be causing your insomnia. This information is not intended to replace advice given to you by your health care provider. Make sure you discuss any questions you have with your health care provider. Document Revised: 10/18/2017 Document Reviewed: 08/15/2017 Elsevier Patient Education  2020 [employer]  

## 2020-01-28 ENCOUNTER — Ambulatory Visit: Payer: 59 | Admitting: Surgery

## 2020-01-29 ENCOUNTER — Other Ambulatory Visit: Payer: Self-pay | Admitting: Physician Assistant

## 2020-01-29 ENCOUNTER — Other Ambulatory Visit: Payer: Self-pay

## 2020-01-29 ENCOUNTER — Ambulatory Visit (INDEPENDENT_AMBULATORY_CARE_PROVIDER_SITE_OTHER): Payer: 59

## 2020-01-29 ENCOUNTER — Other Ambulatory Visit: Payer: Self-pay | Admitting: *Deleted

## 2020-01-29 ENCOUNTER — Ambulatory Visit (INDEPENDENT_AMBULATORY_CARE_PROVIDER_SITE_OTHER): Payer: 59 | Admitting: Family Medicine

## 2020-01-29 ENCOUNTER — Encounter: Payer: Self-pay | Admitting: Family Medicine

## 2020-01-29 ENCOUNTER — Encounter: Payer: Self-pay | Admitting: Physician Assistant

## 2020-01-29 DIAGNOSIS — R102 Pelvic and perineal pain: Secondary | ICD-10-CM

## 2020-01-29 DIAGNOSIS — M8580 Other specified disorders of bone density and structure, unspecified site: Secondary | ICD-10-CM

## 2020-01-29 DIAGNOSIS — M8588 Other specified disorders of bone density and structure, other site: Secondary | ICD-10-CM

## 2020-01-29 DIAGNOSIS — R399 Unspecified symptoms and signs involving the genitourinary system: Secondary | ICD-10-CM

## 2020-01-29 DIAGNOSIS — R3989 Other symptoms and signs involving the genitourinary system: Secondary | ICD-10-CM

## 2020-01-29 DIAGNOSIS — J441 Chronic obstructive pulmonary disease with (acute) exacerbation: Secondary | ICD-10-CM

## 2020-01-29 DIAGNOSIS — F411 Generalized anxiety disorder: Secondary | ICD-10-CM

## 2020-01-29 LAB — MICROSCOPIC EXAMINATION: Renal Epithel, UA: NONE SEEN /hpf

## 2020-01-29 LAB — URINALYSIS, COMPLETE
Bilirubin, UA: NEGATIVE
Glucose, UA: NEGATIVE
Ketones, UA: NEGATIVE
Leukocytes,UA: NEGATIVE
Nitrite, UA: NEGATIVE
Protein,UA: NEGATIVE
Specific Gravity, UA: <1.005 — ABNORMAL LOW (ref 1.005–1.030)
Urobilinogen, Ur: 0.2 mg/dL (ref 0.2–1.0)
pH, UA: 6 (ref 5.0–7.5)

## 2020-01-29 MED ORDER — NITROFURANTOIN MONOHYD MACRO 100 MG PO CAPS: 100.0000 mg | ORAL_CAPSULE | Freq: Two times a day (BID) | ORAL | 0 refills | Status: AC

## 2020-01-29 NOTE — Telephone Encounter (Signed)
Patient has apt with provider this afternoon- this encounter will be closed.

## 2020-01-29 NOTE — Progress Notes (Signed)
Virtual Visit via Telephone Note  I connected with Alicia Sanchez on 02/01/20 at 4:35 PM by telephone and verified that I am speaking with the correct person using two identifiers. Alicia Sanchez is currently located in the car and her husband is currently with her during this visit. The provider, Loman Brooklyn, FNP is located in their office at time of visit.  I discussed the limitations, risks, security and privacy concerns of performing an evaluation and management service by telephone and the availability of in person appointments. I also discussed with the patient that there may be a patient responsible charge related to this service. The patient expressed understanding and agreed to proceed.  Subjective: PCP: Terald Sleeper, PA-C  Chief Complaint  Patient presents with  . Urinary Tract Infection   Urinary Tract Infection: Patient complains of abnormal smelling urine, hematuria and suprapubic pressure She has had symptoms for 1 week. Patient also complains of back pain. Patient denies fever. Patient does have a history of recurrent UTI.  Patient does not have a history of pyelonephritis.    ROS: Per HPI  Current Outpatient Medications:  .  albuterol (PROVENTIL HFA;VENTOLIN HFA) 108 (90 Base) MCG/ACT inhaler, Inhale 2 puffs into the lungs every 6 (six) hours as needed for wheezing or shortness of breath., Disp: 1 Inhaler, Rfl: 6 .  ALPRAZolam (XANAX) 0.5 MG tablet, Take 1 tablet (0.5 mg total) by mouth 2 (two) times daily as needed. for anxiety, Disp: 60 tablet, Rfl: 2 .  baclofen (LIORESAL) 10 MG tablet, Take 1 tablet by mouth twice daily, Disp: 40 tablet, Rfl: 0 .  budesonide-formoterol (SYMBICORT) 160-4.5 MCG/ACT inhaler, Inhale 2 puffs into the lungs 2 (two) times daily., Disp: , Rfl:  .  busPIRone (BUSPAR) 30 MG tablet, TAKE 1 TABLET BY MOUTH THREE TIMES DAILY, Disp: 90 tablet, Rfl: 0 .  cetirizine (ZYRTEC) 10 MG tablet, Take 1 tablet (10 mg total) by mouth daily., Disp: 30  tablet, Rfl: 11 .  diltiazem (CARDIZEM) 30 MG tablet, Take 1 tablet (30 mg total) by mouth 2 (two) times daily as needed., Disp: 180 tablet, Rfl: 2 .  doxepin (SINEQUAN) 100 MG capsule, Take 1 capsule (100 mg total) by mouth at bedtime., Disp: 30 capsule, Rfl: 2 .  DULoxetine (CYMBALTA) 60 MG capsule, Take 2 capsules (120 mg total) by mouth daily., Disp: 180 capsule, Rfl: 1 .  EPINEPHrine 0.3 mg/0.3 mL IJ SOAJ injection, Inject 0.3 mLs (0.3 mg total) into the muscle as needed for anaphylaxis., Disp: 1 Device, Rfl: 2 .  HYDROcodone-homatropine (HYCODAN) 5-1.5 MG/5ML syrup, Take 5 mLs by mouth every 6 (six) hours as needed for cough., Disp: 120 mL, Rfl: 0 .  ipratropium-albuterol (DUONEB) 0.5-2.5 (3) MG/3ML SOLN, Take 3 mLs by nebulization every 6 (six) hours as needed., Disp: 360 mL, Rfl: 3 .  meclizine (ANTIVERT) 25 MG tablet, TAKE 1 TABLET BY MOUTH THREE TIMES DAILY AS NEEDED FOR DIZZINESS, Disp: 60 tablet, Rfl: 0 .  meloxicam (MOBIC) 7.5 MG tablet, Take 1 tablet (7.5 mg total) by mouth daily., Disp: 30 tablet, Rfl: 5 .  metoprolol tartrate (LOPRESSOR) 100 MG tablet, TAKE 1 TABLET ONE HOUR PRIOR TO TEST, Disp: 1 tablet, Rfl: 0 .  mirabegron ER (MYRBETRIQ) 25 MG TB24 tablet, Take 1 tablet (25 mg total) by mouth daily., Disp: 30 tablet, Rfl: 6 .  Multiple Vitamin (MULTIVITAMIN WITH MINERALS) TABS tablet, Take 1 tablet by mouth daily., Disp: , Rfl:  .  nitrofurantoin, macrocrystal-monohydrate, (MACROBID) 100 MG capsule,  Take 1 capsule (100 mg total) by mouth 2 (two) times daily for 5 days. 1 po BId, Disp: 10 capsule, Rfl: 0 .  Respiratory Therapy Supplies (NEBULIZER/TUBING/MOUTHPIECE) KIT, 1 Units 4 (four) times daily by Does not apply route. GIVE face mask and tubing for nebulizer, Disp: 1 each, Rfl: 11 .  rOPINIRole (REQUIP) 1 MG tablet, TAKE 1 TO 4 TABLETS BY MOUTH AT BEDTIME, Disp: 360 tablet, Rfl: 3 .  TRELEGY ELLIPTA 100-62.5-25 MCG/INH AEPB, Inhale 1 Inhaler into the lungs daily., Disp: , Rfl:   .  varenicline (CHANTIX) 1 MG tablet, Take 1 tablet (1 mg total) by mouth 2 (two) times daily., Disp: 60 tablet, Rfl: 1  Allergies  Allergen Reactions  . Doxycycline     Chest pain   Past Medical History:  Diagnosis Date  . Anxiety   . COPD (chronic obstructive pulmonary disease) (HCC)     Observations/Objective: A&O  No respiratory distress or wheezing audible over the phone Mood, judgement, and thought processes all WNL  Assessment and Plan: 1. Suspected UTI - Education provided on UTIs. Encouraged adequate hydration.  - nitrofurantoin, macrocrystal-monohydrate, (MACROBID) 100 MG capsule; Take 1 capsule (100 mg total) by mouth 2 (two) times daily for 5 days. 1 po BId  Dispense: 10 capsule; Refill: 0  2. Suprapubic pressure - Urinalysis, Complete - Urine dipstick shows positive for RBC's.  Micro exam: 0-5 WBC's per HPF, 3-10 RBC's per HPF and few bacteria. - Urine Culture   Follow Up Instructions:  I discussed the assessment and treatment plan with the patient. The patient was provided an opportunity to ask questions and all were answered. The patient agreed with the plan and demonstrated an understanding of the instructions.   The patient was advised to call back or seek an in-person evaluation if the symptoms worsen or if the condition fails to improve as anticipated.  The above assessment and management plan was discussed with the patient. The patient verbalized understanding of and has agreed to the management plan. Patient is aware to call the clinic if symptoms persist or worsen. Patient is aware when to return to the clinic for a follow-up visit. Patient educated on when it is appropriate to go to the emergency department.   Time call ended: 4:42 PM  I provided 9 minutes of non-face-to-face time during this encounter.  Hendricks Limes, MSN, APRN, FNP-C [facility] 02/01/20

## 2020-01-30 LAB — URINE CULTURE

## 2020-02-01 NOTE — Patient Instructions (Signed)
Urinary Tract Infection, Adult A urinary tract infection (UTI) is an infection of any part of the urinary tract. The urinary tract includes:  The kidneys.  The ureters.  The bladder.  The urethra. These organs make, store, and get rid of pee (urine) in the body. What are the causes? This is caused by germs (bacteria) in your genital area. These germs grow and cause swelling (inflammation) of your urinary tract. What increases the risk? You are more likely to develop this condition if:  You have a small, thin tube (catheter) to drain pee.  You cannot control when you pee or poop (incontinence).  You are female, and: ? You use these methods to prevent pregnancy:  A medicine that kills sperm (spermicide).  A device that blocks sperm (diaphragm). ? You have low levels of a female hormone (estrogen). ? You are pregnant.  You have genes that add to your risk.  You are sexually active.  You take antibiotic medicines.  You have trouble peeing because of: ? A prostate that is bigger than normal, if you are female. ? A blockage in the part of your body that drains pee from the bladder (urethra). ? A kidney stone. ? A nerve condition that affects your bladder (neurogenic bladder). ? Not getting enough to drink. ? Not peeing often enough.  You have other conditions, such as: ? Diabetes. ? A weak disease-fighting system (immune system). ? Sickle cell disease. ? Gout. ? Injury of the spine. What are the signs or symptoms? Symptoms of this condition include:  Needing to pee right away (urgently).  Peeing often.  Peeing small amounts often.  Pain or burning when peeing.  Blood in the pee.  Pee that smells bad or not like normal.  Trouble peeing.  Pee that is cloudy.  Fluid coming from the vagina, if you are female.  Pain in the belly or lower back. Other symptoms include:  Throwing up (vomiting).  No urge to eat.  Feeling mixed up (confused).  Being tired  and grouchy (irritable).  A fever.  Watery poop (diarrhea). How is this treated? This condition may be treated with:  Antibiotic medicine.  Other medicines.  Drinking enough water. Follow these instructions at home:  Medicines  Take over-the-counter and prescription medicines only as told by your doctor.  If you were prescribed an antibiotic medicine, take it as told by your doctor. Do not stop taking it even if you start to feel better. General instructions  Make sure you: ? Pee until your bladder is empty. ? Do not hold pee for a long time. ? Empty your bladder after sex. ? Wipe from front to back after pooping if you are a female. Use each tissue one time when you wipe.  Drink enough fluid to keep your pee pale yellow.  Keep all follow-up visits as told by your doctor. This is important. Contact a doctor if:  You do not get better after 1-2 days.  Your symptoms go away and then come back. Get help right away if:  You have very bad back pain.  You have very bad pain in your lower belly.  You have a fever.  You are sick to your stomach (nauseous).  You are throwing up. Summary  A urinary tract infection (UTI) is an infection of any part of the urinary tract.  This condition is caused by germs in your genital area.  There are many risk factors for a UTI. These include having a small, thin   tube to drain pee and not being able to control when you pee or poop.  Treatment includes antibiotic medicines for germs.  Drink enough fluid to keep your pee pale yellow. This information is not intended to replace advice given to you by your health care provider. Make sure you discuss any questions you have with your health care provider. Document Revised: 10/23/2018 Document Reviewed: 05/15/2018 Elsevier Patient Education  2020 [employer]  

## 2020-02-02 ENCOUNTER — Ambulatory Visit (INDEPENDENT_AMBULATORY_CARE_PROVIDER_SITE_OTHER): Payer: 59 | Admitting: Neurology

## 2020-02-02 ENCOUNTER — Other Ambulatory Visit: Payer: Self-pay

## 2020-02-02 DIAGNOSIS — F5104 Psychophysiologic insomnia: Secondary | ICD-10-CM

## 2020-02-02 DIAGNOSIS — G4710 Hypersomnia, unspecified: Secondary | ICD-10-CM | POA: Diagnosis not present

## 2020-02-02 DIAGNOSIS — R0683 Snoring: Secondary | ICD-10-CM

## 2020-02-02 DIAGNOSIS — J441 Chronic obstructive pulmonary disease with (acute) exacerbation: Secondary | ICD-10-CM

## 2020-02-02 DIAGNOSIS — Z9981 Dependence on supplemental oxygen: Secondary | ICD-10-CM

## 2020-02-02 DIAGNOSIS — J069 Acute upper respiratory infection, unspecified: Secondary | ICD-10-CM

## 2020-02-02 DIAGNOSIS — H510 Palsy (spasm) of conjugate gaze: Secondary | ICD-10-CM

## 2020-02-02 DIAGNOSIS — Z720 Tobacco use: Secondary | ICD-10-CM

## 2020-02-02 DIAGNOSIS — F411 Generalized anxiety disorder: Secondary | ICD-10-CM

## 2020-02-03 ENCOUNTER — Other Ambulatory Visit (HOSPITAL_COMMUNITY): Payer: 59

## 2020-02-03 MED ORDER — ALENDRONATE SODIUM 70 MG PO TABS: 70.0000 mg | ORAL_TABLET | ORAL | 11 refills | Status: DC

## 2020-02-04 ENCOUNTER — Ambulatory Visit (HOSPITAL_COMMUNITY)
Admission: RE | Admit: 2020-02-04 | Discharge: 2020-02-04 | Disposition: A | Discharge status: Home / Self Care | Payer: 59 | Source: Ambulatory Visit | Attending: Cardiology | Admitting: Cardiology

## 2020-02-04 ENCOUNTER — Other Ambulatory Visit (HOSPITAL_COMMUNITY): Payer: 59

## 2020-02-04 ENCOUNTER — Encounter (HOSPITAL_COMMUNITY): Payer: Self-pay

## 2020-02-04 ENCOUNTER — Other Ambulatory Visit: Payer: Self-pay

## 2020-02-04 DIAGNOSIS — Z006 Encounter for examination for normal comparison and control in clinical research program: Secondary | ICD-10-CM

## 2020-02-04 DIAGNOSIS — R079 Chest pain, unspecified: Secondary | ICD-10-CM

## 2020-02-04 MED ORDER — NITROGLYCERIN 0.4 MG SL SUBL: 0.8000 mg | SUBLINGUAL_TABLET | Freq: Once | SUBLINGUAL | Status: DC

## 2020-02-04 MED ORDER — IOHEXOL 350 MG/ML SOLN
100.0000 mL | Freq: Once | INTRAVENOUS | Status: AC | PRN
  Administered 2020-02-04: 100 mL via INTRAVENOUS

## 2020-02-04 MED ORDER — NITROGLYCERIN 0.4 MG SL SUBL
0.4000 mg | SUBLINGUAL_TABLET | Freq: Once | SUBLINGUAL | Status: AC
  Administered 2020-02-04: 0.4 mg via SUBLINGUAL

## 2020-02-04 MED ORDER — NITROGLYCERIN 0.4 MG SL SUBL
SUBLINGUAL_TABLET | SUBLINGUAL | Status: AC
  Filled 2020-02-04: qty 1

## 2020-02-04 NOTE — Research (Signed)
Cadfem Informed Consent    Patient Name: Alicia Sanchez    Subject met inclusion and exclusion criteria.  The informed consent form, study requirements and expectations were reviewed with the subject and questions and concerns were addressed prior to the signing of the consent form.  The subject verbalized understanding of the trail requirements.  The subject agreed to participate in the CADFEM trial and signed the informed consent.  The informed consent was obtained prior to performance of any protocol-specific procedures for the subject.  A copy of the signed informed consent was given to the subject and a copy was placed in the subject's medical record.   [name]

## 2020-02-04 NOTE — Progress Notes (Signed)
Patient presented for scheduled CT heart. BP on arrival was 98/66. Post-exam patient with BP 81/50. Patient monitored for approximately 40 minutes with a rise in BP to 88/58. Patient denies any symptoms of hypotension, denies dizziness, weakness or headache. Patient able to ambulate in the room and dress self. Patient's daughter is present and will drive the patient home. Patient and daughter escorted to the Radiology waiting area for discharge, patient able to ambulate without difficulty and declined offer of a wheelchair.  - Alwyn Ren RN

## 2020-02-08 ENCOUNTER — Ambulatory Visit: Admit: 2020-02-08 | Payer: 59 | Admitting: Orthopaedic Surgery

## 2020-02-08 SURGERY — ANTERIOR CERVICAL DECOMPRESSION/DISCECTOMY FUSION 2 LEVELS
Anesthesia: General

## 2020-02-09 ENCOUNTER — Emergency Department (HOSPITAL_COMMUNITY): Payer: 59

## 2020-02-09 ENCOUNTER — Emergency Department (HOSPITAL_COMMUNITY)
Admission: EM | Admit: 2020-02-09 | Discharge: 2020-02-10 | Disposition: A | Discharge status: Home / Self Care | Payer: 59 | Attending: Emergency Medicine | Admitting: Emergency Medicine

## 2020-02-09 ENCOUNTER — Telehealth: Payer: Self-pay | Admitting: *Deleted

## 2020-02-09 ENCOUNTER — Encounter (HOSPITAL_COMMUNITY): Payer: Self-pay | Admitting: Emergency Medicine

## 2020-02-09 ENCOUNTER — Other Ambulatory Visit: Payer: Self-pay

## 2020-02-09 DIAGNOSIS — Z9981 Dependence on supplemental oxygen: Secondary | ICD-10-CM | POA: Insufficient documentation

## 2020-02-09 DIAGNOSIS — Z87891 Personal history of nicotine dependence: Secondary | ICD-10-CM | POA: Insufficient documentation

## 2020-02-09 DIAGNOSIS — R0789 Other chest pain: Secondary | ICD-10-CM | POA: Insufficient documentation

## 2020-02-09 DIAGNOSIS — J449 Chronic obstructive pulmonary disease, unspecified: Secondary | ICD-10-CM | POA: Diagnosis not present

## 2020-02-09 DIAGNOSIS — Z79899 Other long term (current) drug therapy: Secondary | ICD-10-CM | POA: Insufficient documentation

## 2020-02-09 DIAGNOSIS — Z7982 Long term (current) use of aspirin: Secondary | ICD-10-CM | POA: Insufficient documentation

## 2020-02-09 DIAGNOSIS — R072 Precordial pain: Secondary | ICD-10-CM | POA: Diagnosis present

## 2020-02-09 LAB — BASIC METABOLIC PANEL
Anion gap: 10 (ref 5–15)
BUN: 13 mg/dL (ref 6–20)
CO2: 24 mmol/L (ref 22–32)
Calcium: 8.7 mg/dL — ABNORMAL LOW (ref 8.9–10.3)
Chloride: 102 mmol/L (ref 98–111)
Creatinine, Ser: 0.92 mg/dL (ref 0.44–1.00)
GFR calc Af Amer: >60 mL/min (ref >60)
GFR calc non Af Amer: >60 mL/min (ref >60)
Glucose, Bld: 105 mg/dL — ABNORMAL HIGH (ref 70–99)
Potassium: 3.8 mmol/L (ref 3.5–5.1)
Sodium: 136 mmol/L (ref 135–145)

## 2020-02-09 LAB — D-DIMER, QUANTITATIVE: D-Dimer, Quant: 3.39 ug/mL-FEU — ABNORMAL HIGH (ref 0.00–0.50)

## 2020-02-09 LAB — CBC
HCT: 40.3 % (ref 36.0–46.0)
Hemoglobin: 13.5 g/dL (ref 12.0–15.0)
MCH: 30.4 pg (ref 26.0–34.0)
MCHC: 33.5 g/dL (ref 30.0–36.0)
MCV: 90.8 fL (ref 80.0–100.0)
Platelets: 308 10*3/uL (ref 150–400)
RBC: 4.44 MIL/uL (ref 3.87–5.11)
RDW: 12.4 % (ref 11.5–15.5)
WBC: 8.8 10*3/uL (ref 4.0–10.5)
nRBC: 0 % (ref 0.0–0.2)

## 2020-02-09 LAB — TROPONIN I (HIGH SENSITIVITY): Troponin I (High Sensitivity): <2 ng/L (ref <18)

## 2020-02-09 MED ORDER — ATORVASTATIN CALCIUM 40 MG PO TABS: 40.0000 mg | ORAL_TABLET | Freq: Every day | ORAL | 1 refills | Status: DC

## 2020-02-09 MED ORDER — SODIUM CHLORIDE 0.9 % IV BOLUS
1000.0000 mL | Freq: Once | INTRAVENOUS | Status: AC
  Administered 2020-02-09: 1000 mL via INTRAVENOUS

## 2020-02-09 MED ORDER — IOHEXOL 350 MG/ML SOLN
100.0000 mL | Freq: Once | INTRAVENOUS | Status: AC | PRN
  Administered 2020-02-09: 100 mL via INTRAVENOUS

## 2020-02-09 NOTE — ED Triage Notes (Signed)
Patient complains of chest pain that began this morning. Patient states that is hurts to breathe.

## 2020-02-09 NOTE — Telephone Encounter (Signed)
Pt voiced understanding - says she did recent lipids at pcp - also says she has been having chest pain and body aches since last night and hasn't improved and that her husband is taking her to [facility] ED - requested medication into [facility]

## 2020-02-09 NOTE — ED Notes (Signed)
Pt requested oxygen for comfort. Oxygen at 2lpm

## 2020-02-09 NOTE — Telephone Encounter (Signed)
-----   Message from Antoine Poche, MD sent at 02/09/2020 11:06 AM EDT ----- The coronary CT shows only mild plaque in the arteries but no significant blockages. It shows that plaque formation has started and although not severe we need to work to prevent it from progressing. She should start aspirin 81mg  daily, and also start atorvastatin 40mg  daily, both can lower the progression of plaque  MD

## 2020-02-09 NOTE — Telephone Encounter (Signed)
Patient called in stating she was having chest tightness and it really hurt when she takes a breath. Patient advised to be evaluated by ER or Urgent Care. Patient verbalizes understanding.

## 2020-02-09 NOTE — ED Provider Notes (Signed)
[facility] EMERGENCY DEPARTMENT Provider Note   CSN: [id number] Arrival date & time: 02/09/20  1912     History Chief Complaint  Patient presents with  . Chest Pain    Alicia Sanchez is a 59 y.o. female with history of COPD, arthritis, osteoporosis presents for evaluation of acute onset, persistent chest pains beginning this morning upon awakening at around 9 AM.  She reports that last night she did feel nauseous but otherwise was in her usual state of health.  She uses supplemental oxygen at home as needed.  She has felt short of breath at rest and especially with exertion.  Reports constant substernal chest pains which she describes as a tightness which worsened significantly with deep inspiration and getting up from laying.  She denies abdominal pain, fever.  Reports her chronic cough is actually mildly improved as she quit smoking 3 weeks ago.  She started taking Chantix a couple of weeks ago.  She had her first tablet of alendronate yesterday which she was started on for osteoporosis.  She denies recent travel or surgeries, hemoptysis, prior history of DVT or PE, leg swelling, and she is not on hormonal replacement therapy.  She has not tried anything for her symptoms.  The history is provided by the patient.       Past Medical History:  Diagnosis Date  . Anxiety   . COPD (chronic obstructive pulmonary disease) [facility])     Patient Active Problem List   Diagnosis Date Noted  . Chronic insomnia 01/19/2020  . Snoring 01/19/2020  . Oxygen dependent 01/19/2020  . Foraminal stenosis of cervical region 02/06/2019  . Other spondylosis with radiculopathy, cervical region 12/21/2018  . Chronic left shoulder pain 11/24/2018  . Arthritis 11/24/2018  . 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 Actinic keratosis 07/17/2018  . Chronic respiratory failure with hypoxia ([place]) 07/16/2018  . Abnormal findings on diagnostic imaging of lung 06/18/2018  . COPD (chronic obstructive  pulmonary disease) ([place]) 05/22/2018  . Acute respiratory failure with hypoxia ([place])   . Acute cystitis with hematuria 08/21/2017  . Lung nodules 08/21/2017  . GOLD COPD II D 08/21/2017  . Recurrent UTI 08/21/2017  . Generalized anxiety disorder 03/22/2017  . Moderate episode of recurrent major depressive disorder ([place]) 12/16/2016  . Restless legs 12/16/2016  . OAB (overactive bladder) 12/16/2016  . COPD exacerbation ([place]) 11/19/2015  . Acute URI 11/19/2015  . Leukocytosis 11/19/2015  . 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 Tobacco abuse 11/19/2015  . Depression with anxiety 11/19/2015    Past Surgical History:  Procedure Laterality Date  . ABDOMINAL HYSTERECTOMY       OB History   No obstetric history on file.     Family History  Problem Relation Age of Onset  . COPD Mother   . COPD Father   . Diabetes type II Sister   . Diabetes type II Brother   . COPD Maternal Grandfather     Social History   Tobacco Use  . Smoking status: Former Smoker    Packs/day: 0.50    Years: 42.00    Pack years: 21.00    Types: Cigarettes    Quit date: 01/05/2020    Years since quitting: 0.0  . Smokeless tobacco: Never Used  Substance Use Topics  . Alcohol use: No  . Drug use: No    Home Medications Prior to Admission medications   Medication Sig Start Date End Date Taking? Authorizing Provider  albuterol (PROVENTIL HFA;VENTOLIN HFA) 108 (90  Base) MCG/ACT inhaler Inhale 2 puffs into the lungs every 6 (six) hours as needed for wheezing or shortness of breath. 12/14/16   Terald Sleeper, PA-C  alendronate (FOSAMAX) 70 MG tablet Take 1 tablet (70 mg total) by mouth every 7 (seven) days. Take with a full glass of water on an empty stomach. 02/03/20   Terald Sleeper, PA-C  ALPRAZolam Duanne Moron) 0.5 MG tablet Take 1 tablet (0.5 mg total) by mouth 2 (two) times daily as needed. for anxiety 01/06/20   Terald Sleeper, PA-C  aspirin EC 81 MG tablet Take 81 mg by mouth daily.    [provider]    atorvastatin (LIPITOR) 40 MG tablet Take 1 tablet (40 mg total) by mouth daily. 02/09/20 05/09/20  Arnoldo Lenis, MD  baclofen (LIORESAL) 10 MG tablet Take 1 tablet by mouth twice daily 01/08/20   Terald Sleeper, PA-C  budesonide-formoterol [facility]) 160-4.5 MCG/ACT inhaler Inhale 2 puffs into the lungs 2 (two) times daily.    [provider]  busPIRone (BUSPAR) 30 MG tablet TAKE 1 TABLET BY MOUTH THREE TIMES DAILY 02/01/20   Terald Sleeper, PA-C  cetirizine (ZYRTEC) 10 MG tablet Take 1 tablet (10 mg total) by mouth daily. 03/18/19   Terald Sleeper, PA-C  diltiazem (CARDIZEM) 30 MG tablet Take 1 tablet (30 mg total) by mouth 2 (two) times daily as needed. 01/11/20   Arnoldo Lenis, MD  doxepin (SINEQUAN) 100 MG capsule Take 1 capsule (100 mg total) by mouth at bedtime. 01/06/20   Terald Sleeper, PA-C  DULoxetine (CYMBALTA) 60 MG capsule Take 2 capsules (120 mg total) by mouth daily. 01/06/20   Terald Sleeper, PA-C  EPINEPHrine 0.3 mg/0.3 mL IJ SOAJ injection Inject 0.3 mLs (0.3 mg total) into the muscle as needed for anaphylaxis. 03/18/19   Terald Sleeper, PA-C  HYDROcodone-homatropine (HYCODAN) 5-1.5 MG/5ML syrup Take 5 mLs by mouth every 6 (six) hours as needed for cough. 01/15/20   Terald Sleeper, PA-C  ipratropium-albuterol (DUONEB) 0.5-2.5 (3) MG/3ML SOLN Take 3 mLs by nebulization every 6 (six) hours as needed. 12/11/19   Mannam, Hart Robinsons, MD  lidocaine (LMX) 4 % cream Apply 1 application topically 3 (three) times daily as needed. 02/10/20   Cresta Riden A, PA-C  meclizine (ANTIVERT) 25 MG tablet TAKE 1 TABLET BY MOUTH THREE TIMES DAILY AS NEEDED FOR DIZZINESS 11/23/19   Terald Sleeper, PA-C  meloxicam (MOBIC) 7.5 MG tablet Take 1 tablet (7.5 mg total) by mouth daily. 01/12/20   Terald Sleeper, PA-C  metoprolol tartrate (LOPRESSOR) 100 MG tablet TAKE 1 TABLET ONE HOUR PRIOR TO TEST 01/07/20   Arnoldo Lenis, MD  mirabegron ER (MYRBETRIQ) 25 MG TB24 tablet Take 1 tablet (25 mg total) by  mouth daily. 03/18/19   Terald Sleeper, PA-C  Multiple Vitamin (MULTIVITAMIN WITH MINERALS) TABS tablet Take 1 tablet by mouth daily.    [provider]  predniSONE (DELTASONE) 10 MG tablet Take 2 tablets (20 mg total) by mouth 2 (two) times daily with a meal for 5 days. 02/10/20 02/15/20  Rodell Perna A, PA-C  Respiratory Therapy Supplies (NEBULIZER/TUBING/MOUTHPIECE) KIT 1 Units 4 (four) times daily by Does not apply route. GIVE face mask and tubing for nebulizer 09/25/17   Terald Sleeper, PA-C  rOPINIRole (REQUIP) 1 MG tablet TAKE 1 TO 4 TABLETS BY MOUTH AT BEDTIME 05/12/19   Particia Nearing S, PA-C  TRELEGY ELLIPTA 100-62.5-25 MCG/INH AEPB Inhale 1 Inhaler  into the lungs daily. 08/12/19   [provider]  varenicline (CHANTIX) 1 MG tablet Take 1 tablet (1 mg total) by mouth 2 (two) times daily. 01/11/20   Marshell Garfinkel, MD    Allergies    Doxycycline  Review of Systems   Review of Systems  Constitutional: Negative for chills and fever.  Respiratory: Positive for chest tightness and shortness of breath.   Cardiovascular: Positive for chest pain. Negative for leg swelling.  Gastrointestinal: Positive for nausea. Negative for abdominal pain and vomiting.  All other systems reviewed and are negative.   Physical Exam Updated Vital Signs BP 101/66   Pulse 89   Temp 98.2 F (36.8 C)   Resp 19   Ht _0  (1.626 m)   Wt 78.9 kg   SpO2 100%   BMI 29.87 kg/m   Physical Exam Vitals and nursing note reviewed.  Constitutional:      General: She is not in acute distress.    Appearance: She is well-developed.  HENT:     Head: Normocephalic and atraumatic.  Eyes:     General:        Right eye: No discharge.        Left eye: No discharge.     Conjunctiva/sclera: Conjunctivae normal.  Neck:     Vascular: No JVD.     Trachea: No tracheal deviation.  Cardiovascular:     Rate and Rhythm: Normal rate and regular rhythm.     Pulses:          Radial pulses are 2+ on the right  side and 2+ on the left side.       Dorsalis pedis pulses are 2+ on the right side and 2+ on the left side.       Posterior tibial pulses are 2+ on the right side and 2+ on the left side.     Comments: 2+ radial and DP/PT pulses bilaterally, Homans sign absent bilaterally, no lower extremity edema, no palpable cords, compartments are soft  Pulmonary:     Effort: Pulmonary effort is normal.     Breath sounds: Decreased breath sounds present.     Comments: Globally diminished breath sounds, SPO2 saturations 100% on 2 L supplemental oxygen which she was placed on for comfort Chest:     Chest wall: Tenderness present.     Comments: Mild left parasternal chest wall tenderness with no deformity, crepitus, ecchymosis, or flail segment Abdominal:     General: There is no distension.  Musculoskeletal:     Cervical back: Normal range of motion and neck supple.     Right lower leg: No tenderness. No edema.     Left lower leg: No tenderness. No edema.  Skin:    General: Skin is warm.     Findings: No erythema.  Neurological:     Mental Status: She is alert.  Psychiatric:        Behavior: Behavior normal.     ED Results / Procedures / Treatments   Labs (all labs ordered are listed, but only abnormal results are displayed) Labs Reviewed  BASIC METABOLIC PANEL - Abnormal; Notable for the following components:      Result Value   Glucose, Bld 105 (*)    Calcium 8.7 (*)    All other components within normal limits  D-DIMER, QUANTITATIVE (NOT AT [facility]) - Abnormal; Notable for the following components:   D-Dimer, Quant 3.39 (*)    All other components within normal limits  CBC  TROPONIN I (HIGH SENSITIVITY)  TROPONIN I (HIGH SENSITIVITY)    EKG EKG Interpretation  Date/Time:  Tuesday February 09 2020 19:31:17 EDT Ventricular Rate:  107 PR Interval:  118 QRS Duration: 66 QT Interval:  324 QTC Calculation: 432 R Axis:   101 Text Interpretation: Sinus tachycardia Rightward axis Low  voltage QRS Borderline ECG Confirmed by Nat Christen [phone]) on 02/09/2020 8:46:31 PM   Radiology DG Chest 2 View  Result Date: 02/09/2020 CLINICAL DATA:  59 year old female with chest pain. EXAM: CHEST - 2 VIEW COMPARISON:  Chest radiograph dated 05/21/2018. FINDINGS: The heart size and mediastinal contours are within normal limits. Both lungs are clear. The visualized skeletal structures are unremarkable. IMPRESSION: No active cardiopulmonary disease. Electronically Signed   By: Anner Crete M.D.   On: 02/09/2020 19:58   CT Angio Chest PE W and/or Wo Contrast  Result Date: 02/09/2020 CLINICAL DATA:  59 year old female with chest pain. Concern for pulmonary embolism. EXAM: CT ANGIOGRAPHY CHEST WITH CONTRAST TECHNIQUE: Multidetector CT imaging of the chest was performed using the standard protocol during bolus administration of intravenous contrast. Multiplanar CT image reconstructions and MIPs were obtained to evaluate the vascular anatomy. CONTRAST:  148m OMNIPAQUE IOHEXOL 350 MG/ML SOLN COMPARISON:  Chest radiograph dated 02/09/2020 and CT dated 12/17/2019. FINDINGS: Cardiovascular: There is no cardiomegaly or pericardial effusion. The thoracic aorta is unremarkable. The origins of the great vessels of the aortic arch appear patent as visualized. No pulmonary artery embolus identified. Mediastinum/Nodes: There is no hilar or mediastinal adenopathy. The esophagus is grossly unremarkable. No mediastinal fluid collection. Lungs/Pleura: Background of centrilobular emphysema. No focal consolidation, pleural effusion, pneumothorax. The central airways are patent. Upper Abdomen: No acute abnormality. Musculoskeletal: No chest wall abnormality. No acute or significant osseous findings. Review of the MIP images confirms the above findings. IMPRESSION: No acute intrathoracic pathology. No CT evidence of pulmonary embolism. Electronically Signed   By: AAnner CreteM.D.   On: 02/09/2020 23:35     Procedures Procedures (including critical care time)  Medications Ordered in ED Medications  sodium chloride 0.9 % bolus 1,000 mL (0 mLs Intravenous Stopped 02/09/20 2352)  iohexol (OMNIPAQUE) 350 MG/ML injection 100 mL (100 mLs Intravenous Contrast Given 02/09/20 2306)  predniSONE (DELTASONE) tablet 60 mg (60 mg Oral Given 02/10/20 0032)    ED Course  I have reviewed the triage vital signs and the nursing notes.  Pertinent labs & imaging results that were available during my care of the patient were reviewed by me and considered in my medical decision making (see chart for details).    MDM Rules/Calculators/A&P                      Patient presenting for evaluation of pleuritic chest pains and shortness of breath beginning this morning upon awakening.  Patient is afebrile, blood pressures a little soft occasionally but this appears to be her baseline per chart review.  She is nontoxic in appearance.  She was placed on supplemental oxygen for comfort but was not hypoxic in the ED.  Does not appear to be in any respiratory distress.  Lab work reviewed and interpreted by myself shows no leukocytosis, no anemia, no metabolic derangements, no renal insufficiency.  Her D-dimer is elevated so a CTA of the chest was obtained to rule out PE.  Her EKG shows sinus tachycardia, no acute ischemic abnormalities and serial troponins are negative.  Doubt ACS/MI at this time.  Imaging today is negative for acute PE,  no evidence of pneumonia or pleural effusion.  No evidence of pericardial effusion or pneumothorax.  No evidence of dissection, cardiac tamponade, esophageal rupture.  On reevaluation the patient is resting comfortably no apparent distress.  She refused pain medicine when offered.  She remains hemodynamically stable.  Discussed that her symptoms could be musculoskeletal in etiology as she did have some parasternal tenderness to palpation of the chest wall on the left side.  We will try a  short course of steroids and lidocaine cream.  She will also call her PCP tomorrow for further recommendations that she could potentially be having a side effect of the alendronate that she started yesterday.  Recommend close PCP or pulmonology follow-up.  Discussed strict ED return precautions. Patient verbalized understanding of and agreement with plan and is safe for discharge home at this time.   Final Clinical Impression(s) / ED Diagnoses Final diagnoses:  Atypical chest pain    Rx / DC Orders ED Discharge Orders         Ordered    predniSONE (DELTASONE) 10 MG tablet  2 times daily with meals     02/10/20 0029    lidocaine (LMX) 4 % cream  3 times daily PRN     02/10/20 0029           Renita Papa, PA-C 02/10/20 0040    Nat Christen, MD 02/10/20 2318

## 2020-02-10 LAB — TROPONIN I (HIGH SENSITIVITY): Troponin I (High Sensitivity): <2 ng/L (ref <18)

## 2020-02-10 MED ORDER — PREDNISONE 50 MG PO TABS
60.0000 mg | ORAL_TABLET | Freq: Once | ORAL | Status: AC
  Administered 2020-02-10: 60 mg via ORAL
  Filled 2020-02-10: qty 1

## 2020-02-10 MED ORDER — LIDOCAINE 4 % EX CREA: 1.0000 "application " | TOPICAL_CREAM | Freq: Three times a day (TID) | CUTANEOUS | 0 refills | Status: DC | PRN

## 2020-02-10 MED ORDER — PREDNISONE 10 MG PO TABS: 20.0000 mg | ORAL_TABLET | Freq: Two times a day (BID) | ORAL | 0 refills | Status: AC

## 2020-02-10 NOTE — Discharge Instructions (Signed)
Start taking prednisone as prescribed beginning tomorrow.  You received the first dose in the emergency department today.  Can apply lidocaine cream to areas of pain to see if this will help alleviate your symptoms.  Your work-up today shows no signs of blood clot or heart attack in the lungs.  No signs of pneumonia either.    Overall reassuring but you could be having a side effect to the new medication that you started taking yesterday.  Please call your primary care provider in the morning to schedule follow-up and for further recommendations regarding your medication and your symptoms.  Return to the emergency department if any concerning signs or symptoms develop such as severe pain, persistent shortness of breath, loss of consciousness, or fevers.

## 2020-02-10 NOTE — Procedures (Signed)
PATIENT'S NAME:  Alicia Sanchez, Alicia Sanchez DOB:      10-20-1961      MR#:    [id number]     DATE OF RECORDING: 02/02/2020 REFERRING M.D.:  Prudy Feeler PA-C Study Performed:   Baseline Polysomnogram HISTORY:  This 59 year- old Caucasian female patient was seen on 01/19/2020, referred by PA Prudy Feeler. The notes do not state why she is referred. The patient is carrying oxygen with her at 2 L a minute.  Oxygen dependence is not noted in her medical history.  Chief concern according to patient: "I have chronic Insomnia for most of my life" and ;" I had a sleep study done many years ago, but I am always tired, I snore and talk in my sleep"    Alicia Sanchez is seen on 01-19-2020-a right-handed Caucasian female with a medical history of Anxiety and COPD (chronic obstructive pulmonary disease) (HCC).  She also is treated by Aniceto Boss for overactive bladder, depression with anxiety, she is on Xanax, doxepin, duloxetine she also has episodic and recurrent major depressive disorder /the same medications applied, generalized anxiety disorder/ the same medications applied. The patient reports that she was recently discovered she has elevated cholesterol levels. She has gained weight and her BMI is now 34 which places her into the obese but not a morbidly obese category.  Her cardiologist recently referred her for a CT angiogram of the heart which has not been performed yet. The work-up is for coronary artery disease.  O2 dependent. She reports a head injury at age 40 years. She has a lazy eye - right eye -Strabism.    Sleep relevant medical history: Nocturia: 3, insomnia, oxygen needed with physical activity. Very dry mouth and neck pain in the setting of cervical spine DDD, Family medical /sleep history: mother with OSA, insomnia.  Tobacco use active for 40 years, cut down, did not quit.  She snacks all night- she eats in bed. The patient intends to sleep not earlier than at 11 PM and stays up - tossing and turning -she goes  to sleep for 2-3 hours, wakes for nocturia.  Averaging 6 hours of sleep.  12 noon is the usual rise time. Naps are taken - lasting from 15-30 minutes and are more refreshing than nocturnal sleep.    The patient endorsed the Epworth Sleepiness Scale at 12 points.   The patient's weight 174 pounds with a height of 64 (inches), resulting in a BMI of 29.7 kg/m2. The patient's neck circumference measured 15.5 inches.  CURRENT MEDICATIONS: Proventil, Xanax, Lioresal, Symbicort, Buspar, Zyrtec, Cardizem, Sinequan, Cymbalta, Epinephrine, Hydrocodon, Antivert, Mobic, Lopressor, Myrbetriq, Requip, Chantix   PROCEDURE:  This is a multichannel digital polysomnogram utilizing the Somnostar 11.2 system.  Electrodes and sensors were applied and monitored per AASM Specifications.   EEG, EOG, Chin and Limb EMG, were sampled at 200 Hz.  ECG, Snore and Nasal Pressure, Thermal Airflow, Respiratory Effort, CPAP Flow and Pressure, Oximetry was sampled at 50 Hz. Digital video and audio were recorded.          BASELINE STUDY: Lights Out was at 20:53 and Lights On at 05:02.  Total recording time (TRT) was 489.5 minutes, with a total sleep time (TST) of 283.5 minutes.   The patient's sleep latency was 87 minutes.  REM latency was 293 minutes.  The sleep efficiency was 57.9 %.     SLEEP ARCHITECTURE: WASO (Wake after sleep onset) was 119 minutes.  There were 84.5 minutes in Stage N1, 126 minutes  Stage N2, 71.5 minutes Stage N3 and 1.5 minutes in Stage REM.  The percentage of Stage N1 was 29.8%, Stage N2 was 44.4%, Stage N3 was 25.2% and Stage R (REM sleep) was 0.5%.   RESPIRATORY ANALYSIS:  There were a total of 0 respiratory events:  0 apneas 0 hypopneas. The patient snored loudly - and had several arousals.    The patient spent 35 minutes of total sleep time in the supine position and 249 minutes in non-supine. The supine AHI was 0.0 versus a non-supine AHI of 0.0/h.  OXYGEN SATURATION & C02:  The Wake baseline 02  saturation was 93%, with the lowest being 83%. Time spent below 89% saturation equaled 24 minutes. The arousals were noted as: 52 were spontaneous, 0 were associated with PLMs, 0 were associated with apnea events. The patient had a total of 0 Periodic Limb Movements. Audio and video analysis did not show any abnormal or unusual movements, behaviors, phonations or vocalizations. The patient took bathroom breaks. Snoring was noted. EKG was in keeping with normal sinus rhythm (NSR).   IMPRESSION:  1. No Obstructive Sleep Apnea (OSA) 2. No Periodic Limb Movement Disorder (PLMD) 3. Primary Snoring/UARS and very few related arousals. 4. 24 minutes total time in hypoxemia - nadir of 74%. The sleep study did not utilize oxygen.    RECOMMENDATIONS:  I cannot confirm a sleep breathing disorder, and my results would not support need for nocturnal oxygen.  A follow up is not needed - the patients sleep habits and sleep hygiene need to be addressed outside a neurological sleep clinic. Consider behaviour health / cognitive therapy.   I certify that I have reviewed the entire raw data recording prior to the issuance of this report in accordance with the Standards of Accreditation of the American Academy of Sleep Medicine (AASM)   Larey Seat, MD Diplomat, American Board of Psychiatry and Neurology  Diplomat, American Board of Sleep Medicine Market researcher, [place] [facility] [employer]

## 2020-02-10 NOTE — Progress Notes (Signed)
IMPRESSION:   1. No Obstructive Sleep Apnea (OSA)  2. No Periodic Limb Movement Disorder (PLMD)  3. Primary Snoring/UARS and very few related arousals.  4. 24 minutes total time in hypoxemia - nadir of 74%. The sleep  study did not utilize oxygen.    RECOMMENDATIONS:   I cannot confirm a sleep breathing disorder, and my results would  not support need for nocturnal oxygen.  A follow up with me is not needed - the patients sleep habits and sleep  hygiene need to be addressed outside a neurological sleep clinic.  Consider behaviour health / cognitive therapy.

## 2020-02-11 ENCOUNTER — Other Ambulatory Visit: Payer: Self-pay | Admitting: Physician Assistant

## 2020-02-11 ENCOUNTER — Telehealth: Payer: Self-pay | Admitting: Neurology

## 2020-02-11 ENCOUNTER — Encounter: Payer: Self-pay | Admitting: Neurology

## 2020-02-11 DIAGNOSIS — J441 Chronic obstructive pulmonary disease with (acute) exacerbation: Secondary | ICD-10-CM

## 2020-02-11 MED ORDER — TRELEGY ELLIPTA 100-62.5-25 MCG/INH IN AEPB: 1.0000 | INHALATION_SPRAY | Freq: Every day | RESPIRATORY_TRACT | 2 refills | Status: DC

## 2020-02-11 NOTE — Telephone Encounter (Signed)
-----   Message from Melvyn Novas, MD sent at 02/10/2020  5:52 PM EDT ----- IMPRESSION:   1. No Obstructive Sleep Apnea (OSA)  2. No Periodic Limb Movement Disorder (PLMD)  3. Primary Snoring/UARS and very few related arousals.  4. 24 minutes total time in hypoxemia - nadir of 74%. The sleep  study did not utilize oxygen.    RECOMMENDATIONS:   I cannot confirm a sleep breathing disorder, and my results would  not support need for nocturnal oxygen.  A follow up with me is not needed - the patients sleep habits and sleep  hygiene need to be addressed outside a neurological sleep clinic.  Consider behaviour health / cognitive therapy.

## 2020-02-11 NOTE — Telephone Encounter (Signed)
Called patient to discuss sleep study results. No answer at this time. LVM for the patient to call back.  I will send a mychart message also.

## 2020-02-14 ENCOUNTER — Encounter (INDEPENDENT_AMBULATORY_CARE_PROVIDER_SITE_OTHER): Payer: Self-pay

## 2020-02-16 ENCOUNTER — Inpatient Hospital Stay: Payer: 59 | Admitting: Orthopaedic Surgery

## 2020-02-19 ENCOUNTER — Ambulatory Visit (HOSPITAL_COMMUNITY): Payer: 59

## 2020-02-22 ENCOUNTER — Other Ambulatory Visit: Payer: Self-pay

## 2020-02-22 ENCOUNTER — Encounter: Payer: Self-pay | Admitting: Family Medicine

## 2020-02-22 ENCOUNTER — Telehealth (INDEPENDENT_AMBULATORY_CARE_PROVIDER_SITE_OTHER): Payer: 59 | Admitting: Family Medicine

## 2020-02-22 DIAGNOSIS — Z720 Tobacco use: Secondary | ICD-10-CM

## 2020-02-22 DIAGNOSIS — F411 Generalized anxiety disorder: Secondary | ICD-10-CM | POA: Diagnosis not present

## 2020-02-22 DIAGNOSIS — R252 Cramp and spasm: Secondary | ICD-10-CM

## 2020-02-22 DIAGNOSIS — F5104 Psychophysiologic insomnia: Secondary | ICD-10-CM

## 2020-02-22 DIAGNOSIS — Z9981 Dependence on supplemental oxygen: Secondary | ICD-10-CM

## 2020-02-22 DIAGNOSIS — M810 Age-related osteoporosis without current pathological fracture: Secondary | ICD-10-CM | POA: Insufficient documentation

## 2020-02-22 DIAGNOSIS — G2581 Restless legs syndrome: Secondary | ICD-10-CM

## 2020-02-22 DIAGNOSIS — J302 Other seasonal allergic rhinitis: Secondary | ICD-10-CM

## 2020-02-22 MED ORDER — ALPRAZOLAM 0.5 MG PO TABS: 0.5000 mg | ORAL_TABLET | Freq: Every day | ORAL | 2 refills | Status: DC | PRN

## 2020-02-22 MED ORDER — CETIRIZINE HCL 10 MG PO TABS: 10.0000 mg | ORAL_TABLET | Freq: Every day | ORAL | 2 refills | Status: DC
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
## 2020-03-03 ENCOUNTER — Telehealth: Payer: 59 | Admitting: Physician Assistant

## 2020-03-03 DIAGNOSIS — J019 Acute sinusitis, unspecified: Secondary | ICD-10-CM

## 2020-03-03 MED ORDER — AMOXICILLIN-POT CLAVULANATE 875-125 MG PO TABS: 1.0000 | ORAL_TABLET | Freq: Two times a day (BID) | ORAL | 0 refills | Status: DC

## 2020-03-03 NOTE — Progress Notes (Signed)
We are sorry that you are not feeling well.  Here is how we plan to help!  Based on what you have shared with me it looks like you have sinusitis and or bacterial pneumonia.  Sinusitis is inflammation and infection in the sinus cavities of the head.  Based on your presentation I believe you most likely have Acute Bacterial Sinusitis.  This is an infection caused by bacteria and is treated with antibiotics. I have prescribed Augmentin 875mg /125mg  one tablet twice daily with food, for 7 days. You may use an oral decongestant such as Mucinex D or if you have glaucoma or high blood pressure use plain Mucinex. Saline nasal spray help and can safely be used as often as needed for congestion.  If you develop worsening sinus pain, fever or notice severe headache and vision changes, or if symptoms are not better after completion of antibiotic, please schedule an appointment with a health care provider.    If you have worsening symptoms in any way, shortness of breath, fever or difficulty breathing you will need to be seen right away at an urgent care or in the ER.  Sinus infections are not as easily transmitted as other respiratory infection, however we still recommend that you avoid close contact with loved ones, especially the very young and elderly.  Remember to wash your hands thoroughly throughout the day as this is the number one way to prevent the spread of infection!  Home Care:  Only take medications as instructed by your medical team.  Complete the entire course of an antibiotic.  Do not take these medications with alcohol.  A steam or ultrasonic humidifier can help congestion.  You can place a towel over your head and breathe in the steam from hot water coming from a faucet.  Avoid close contacts especially the very young and the elderly.  Cover your mouth when you cough or sneeze.  Always remember to wash your hands.  Get Help Right Away If:  You develop worsening fever or sinus  pain.  You develop a severe head ache or visual changes.  Your symptoms persist after you have completed your treatment plan.  Make sure you  Understand these instructions.  Will watch your condition.  Will get help right away if you are not doing well or get worse.  Your e-visit answers were reviewed by a board certified advanced clinical practitioner to complete your personal care plan.  Depending on the condition, your plan could have included both over the counter or prescription medications.  If there is a problem please reply  once you have received a response from your provider.  Your safety is important to .  If you have drug allergies check your prescription carefully.    You can use MyChart to ask questions about today's visit, request a non-urgent call back, or ask for a work or school excuse for 24 hours related to this e-Visit. If it has been greater than 24 hours you will need to follow up with your provider, or enter a new e-Visit to address those concerns.  You will get an e-mail in the next two days asking about your experience.  I hope that your e-visit has been valuable and will speed your recovery. Thank you for using e-visits.   Greater than 5 minutes, yet less than 10 minutes of time have been spent researching, coordinating, and implementing care for this patient today

## 2020-03-11 ENCOUNTER — Ambulatory Visit (INDEPENDENT_AMBULATORY_CARE_PROVIDER_SITE_OTHER): Payer: 59 | Admitting: Pulmonary Disease

## 2020-03-11 ENCOUNTER — Encounter: Payer: Self-pay | Admitting: Pulmonary Disease

## 2020-03-11 ENCOUNTER — Other Ambulatory Visit: Payer: Self-pay

## 2020-03-11 VITALS — BP 140/80 | HR 111 | Temp 97.3°F | Ht 65.0 in | Wt 175.4 lb

## 2020-03-11 DIAGNOSIS — J449 Chronic obstructive pulmonary disease, unspecified: Secondary | ICD-10-CM | POA: Diagnosis not present

## 2020-03-11 NOTE — Progress Notes (Signed)
Alicia Sanchez    [id number]    1961/08/05  Primary Care Physician:Joyce, Shireen Quan, FNP  Referring Physician: Terald Sleeper, PA-C [id number] [place],  [place] [address]  Chief complaint:   Follow-up for COPD  HPI: Alicia Sanchez is a 59 year old active smoker with history of anxiety, depression, COPD.  Initially treated with Memory Dance and Incruse which was consolidated to Trelegy inhaler in 2018  She has daily symptoms of snoring. She does sleep study 4 years ago but does not know the result of this test. She is a diagnosis of restless leg syndrome and is on requip. She did not have the repeat sleep study done as it was not covered by insurance.  Pets:Dog which lives outside the house. No pets, exotic pets. Occupation:Customer sevice rep. previously worked in [employer] Exposures: His current dust exposure and previous exposure to cotton fiber in her line of work Smoking history: 35-pack-year smoking history. Continues to smoke 1 pack per day  Interim history: She is taking both Symbicort and Trelegy together for unclear reasons States that dyspnea is doing well with no cough, mucus production.  Seen in ED on 2/23 with pleuritic chest pain.  CTA normal.  Pain was felt to be musculoskeletal Received Augmentin last week for acute sinusitis.  Has quit smoking with Chantix and nicotine patches.  There is concern with her depression however her mood is doing well.  She has weird dreams at night which is tolerable.  Outpatient Encounter Medications as of 03/11/2020  Medication Sig  . albuterol (PROVENTIL HFA;VENTOLIN HFA) 108 (90 Base) MCG/ACT inhaler Inhale 2 puffs into the lungs every 6 (six) hours as needed for wheezing or shortness of breath.  Marland Kitchen alendronate (FOSAMAX) 70 MG tablet Take 1 tablet (70 mg total) by mouth every 7 (seven) days. Take with a full glass of water on an empty stomach.  . ALPRAZolam (XANAX) 0.5 MG tablet Take 1 tablet (0.5 mg total) by mouth daily as needed  for anxiety. for anxiety  . aspirin EC 81 MG tablet Take 81 mg by mouth daily.  Marland Kitchen atorvastatin (LIPITOR) 40 MG tablet Take 1 tablet (40 mg total) by mouth daily.  . baclofen (LIORESAL) 10 MG tablet Take 1 tablet by mouth twice daily  . budesonide-formoterol (SYMBICORT) 160-4.5 MCG/ACT inhaler Inhale 2 puffs into the lungs 2 (two) times daily.  . busPIRone (BUSPAR) 30 MG tablet TAKE 1 TABLET BY MOUTH THREE TIMES DAILY  . calcium carbonate (OS-CAL) 1250 (500 Ca) MG chewable tablet Chew 1 tablet by mouth daily.  . cetirizine (ZYRTEC) 10 MG tablet Take 1 tablet (10 mg total) by mouth daily.  Marland Kitchen diltiazem (CARDIZEM) 30 MG tablet Take 1 tablet (30 mg total) by mouth 2 (two) times daily as needed.  . doxepin (SINEQUAN) 100 MG capsule Take 1 capsule (100 mg total) by mouth at bedtime.  . DULoxetine (CYMBALTA) 60 MG capsule Take 2 capsules (120 mg total) by mouth daily.  Marland Kitchen EPINEPHrine 0.3 mg/0.3 mL IJ SOAJ injection Inject 0.3 mLs (0.3 mg total) into the muscle as needed for anaphylaxis.  Marland Kitchen ipratropium-albuterol (DUONEB) 0.5-2.5 (3) MG/3ML SOLN Take 3 mLs by nebulization every 6 (six) hours as needed.  . lidocaine (LMX) 4 % cream Apply 1 application topically 3 (three) times daily as needed.  . meclizine (ANTIVERT) 25 MG tablet TAKE 1 TABLET BY MOUTH THREE TIMES DAILY AS NEEDED FOR DIZZINESS  . meloxicam (MOBIC) 7.5 MG tablet Take 1 tablet (7.5  mg total) by mouth daily.  . mirabegron ER (MYRBETRIQ) 25 MG TB24 tablet Take 1 tablet (25 mg total) by mouth daily.  . Multiple Vitamin (MULTIVITAMIN WITH MINERALS) TABS tablet Take 1 tablet by mouth daily.  . Multiple Vitamins-Minerals (VITAMIN D3 COMPLETE PO) Take by mouth 2 (two) times daily.  Marland Kitchen Respiratory Therapy Supplies (NEBULIZER/TUBING/MOUTHPIECE) KIT 1 Units 4 (four) times daily by Does not apply route. GIVE face mask and tubing for nebulizer  . rOPINIRole (REQUIP) 1 MG tablet TAKE 1 TO 4 TABLETS BY MOUTH AT BEDTIME (Patient taking differently: Take 3 mg  by mouth at bedtime. TAKE 1 TO 4 TABLETS BY MOUTH AT BEDTIME)  . TRELEGY ELLIPTA 100-62.5-25 MCG/INH AEPB Inhale 1 puff into the lungs daily.  . varenicline (CHANTIX) 1 MG tablet Take 1 tablet (1 mg total) by mouth 2 (two) times daily.  . vitamin C (ASCORBIC ACID) 250 MG tablet Take by mouth daily.  . [DISCONTINUED] amoxicillin-clavulanate (AUGMENTIN) 875-125 MG tablet Take 1 tablet by mouth 2 (two) times daily. One po bid x 7 days   No facility-administered encounter medications on file as of 03/11/2020.   Physical Exam: Blood pressure 122/82, pulse 80, temperature 97.6 F (36.4 C), temperature source Temporal, height 5' 4.5" (1.638 m), weight 171 lb (77.6 kg), SpO2 99 %. Gen:      No acute distress HEENT:  EOMI, sclera anicteric Neck:     No masses; no thyromegaly Lungs:    Clear to auscultation bilaterally; normal respiratory effort CV:         Regular rate and rhythm; no murmurs Abd:      + bowel sounds; soft, non-tender; no palpable masses, no distension Ext:    No edema; adequate peripheral perfusion Skin:      Warm and dry; no rash Neuro: alert and oriented x 3 Psych: normal mood and affect  Data Reviewed: Imaging Chest x-ray 07/22/15-interstitial marking consistent with bronchitis Chest x-ray 11/19/15-chronic interstitial marking Chest x-ray 11/20/15-chronic interstitial marking. Stable compared to prior Chest x-ray 04/25/17-hyperinflation, chronic interstitial marking. No acute process. Screening CT chest 05/08/17-centrilobular emphysema, calcified granuloma, tiny subcentimeter pulmonary nodules. CTA 05/21/2018- emphysema, scattered pulmonary nodule.  4 mm right lower lung nodule is new. I have reviewed the images personally  CTA [facility]) 03/11/2019-no pulmonary embolism.  No active cardiopulmonary disease.  Coronary artery calcification No images available in PACS  PFTs 07/24/17 FVC 2.54 (74%], FEV1 1.55 (58%], F/F 61, TLC 103%, DLCO 64% Moderate obstruction and diffusion  impairment with air trapping  Labs CBC/1/17-absolute eosinophil count 0 Alpha-1 antitrypsin 04/25/2017-136, PI MM  Assessment:  COPD, chronic bronchitis Currently on trelegy, albuterol Stop Symbicort Supplemental oxygen nebulizers as needed.  Active smoker Congratulated on quitting smoking.  Continue Chantix  Subcentimeter pulmonary nodule Not reported on CT last year and CTA in march 2021 Referral for low-dose screening CT of the chest to start in 2022   Restless leg syndrome, suspected sleep apnea On requip for restless leg syndrome.  Sleep study not done as it is not covered by insurance.  Plan/Recommendations: - Continue trelegy duo nebs as needed - Stop Symbicort - Low dose screening CT of the chest to start in 2022  Marshell Garfinkel MD [facility] 03/11/2020, 11:39 AM  CC: Terald Sleeper, PA-C

## 2020-03-11 NOTE — Patient Instructions (Signed)
I am glad you are doing well with regard to your breathing Congratulations on quitting smoking Stop the Symbicort.  Continue Trelegy inhaler Follow-up in 6 months.

## 2020-03-14 ENCOUNTER — Other Ambulatory Visit: Payer: Self-pay | Admitting: Pulmonary Disease

## 2020-03-14 DIAGNOSIS — Z720 Tobacco use: Secondary | ICD-10-CM

## 2020-03-21 ENCOUNTER — Other Ambulatory Visit: Payer: Self-pay | Admitting: *Deleted

## 2020-03-21 DIAGNOSIS — F411 Generalized anxiety disorder: Secondary | ICD-10-CM

## 2020-03-21 DIAGNOSIS — F331 Major depressive disorder, recurrent, moderate: Secondary | ICD-10-CM

## 2020-03-21 MED ORDER — BUSPIRONE HCL 30 MG PO TABS: 30.0000 mg | ORAL_TABLET | Freq: Three times a day (TID) | ORAL | 1 refills | Status: DC

## 2020-03-21 MED ORDER — DOXEPIN HCL 100 MG PO CAPS: 100.0000 mg | ORAL_CAPSULE | Freq: Every day | ORAL | 2 refills | Status: DC

## 2020-04-29 ENCOUNTER — Telehealth: Payer: Self-pay | Admitting: Family Medicine

## 2020-04-29 NOTE — Telephone Encounter (Signed)
Appointment scheduled 05/24/2020, patient aware

## 2020-05-09 ENCOUNTER — Telehealth: Payer: Self-pay | Admitting: Family Medicine

## 2020-05-09 NOTE — Telephone Encounter (Signed)
Labs can be ordered day of appointment. If she feels she has a UTI she needs an appointment sooner than a month away. If she has a UTI that goes untreated, she could become septic.

## 2020-05-09 NOTE — Telephone Encounter (Signed)
Pt has appt scheduled on 06/03/20 to establish care with Dr Nadine Counts. Requested that order be placed ahead of appt date for labs and urine sample. Pt says she has been having back pain and wants to rule out uti.

## 2020-05-09 NOTE — Telephone Encounter (Signed)
She was advised of provider's notes.  (She had cancelled her July appointment this morning but did not mention this).

## 2020-05-09 NOTE — Telephone Encounter (Signed)
Order lab work when appointment is a month away?

## 2020-05-24 ENCOUNTER — Ambulatory Visit: Payer: 59 | Admitting: Family Medicine

## 2020-06-02 ENCOUNTER — Ambulatory Visit: Payer: 59 | Admitting: Cardiology

## 2020-06-03 ENCOUNTER — Ambulatory Visit (INDEPENDENT_AMBULATORY_CARE_PROVIDER_SITE_OTHER): Payer: 59 | Admitting: Family Medicine

## 2020-06-03 ENCOUNTER — Encounter: Payer: Self-pay | Admitting: Family Medicine

## 2020-06-03 ENCOUNTER — Other Ambulatory Visit: Payer: Self-pay

## 2020-06-03 VITALS — BP 112/72 | HR 112 | Temp 97.9°F | Ht 65.0 in | Wt 169.0 lb

## 2020-06-03 DIAGNOSIS — G2581 Restless legs syndrome: Secondary | ICD-10-CM

## 2020-06-03 DIAGNOSIS — F4310 Post-traumatic stress disorder, unspecified: Secondary | ICD-10-CM

## 2020-06-03 DIAGNOSIS — Z9981 Dependence on supplemental oxygen: Secondary | ICD-10-CM | POA: Diagnosis not present

## 2020-06-03 DIAGNOSIS — F411 Generalized anxiety disorder: Secondary | ICD-10-CM | POA: Diagnosis not present

## 2020-06-03 MED ORDER — ALPRAZOLAM 0.5 MG PO TABS: 0.2500 mg | ORAL_TABLET | Freq: Two times a day (BID) | ORAL | 2 refills | Status: DC | PRN

## 2020-06-03 NOTE — Patient Instructions (Signed)
You had labs performed today.  You will be contacted with the results of the labs once they are available, usually in the next 3 business days for routine lab work.  If you have an active my chart account, they will be released to your MyChart.  If you prefer to have these labs released to you via telephone, please let us know.  If you had a pap smear or biopsy performed, expect to be contacted in about 7-10 days.  Referral to psychiatry placed.  I worry about Xanax in the setting of your lung disease and medical history.  I have bridged you for 3 months but you will need to establish with a psychiatrist ASAP for evaluation and further refills if they deem appropriate.  I am checking into your restless legs more.  Labs are being collected to look for metabolic reasons for your symptoms.  We may consider having you see a neurologist but let me do some digging first.
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
## 2020-06-04 LAB — CMP14+EGFR
ALT: 16 IU/L (ref 0–32)
AST: 23 IU/L (ref 0–40)
Albumin/Globulin Ratio: 2.2 (ref 1.2–2.2)
Albumin: 4.3 g/dL (ref 3.8–4.9)
Alkaline Phosphatase: 76 IU/L (ref 48–121)
BUN/Creatinine Ratio: 11 (ref 9–23)
BUN: 9 mg/dL (ref 6–24)
Bilirubin Total: 0.2 mg/dL (ref 0.0–1.2)
CO2: 24 mmol/L (ref 20–29)
Calcium: 9.6 mg/dL (ref 8.7–10.2)
Chloride: 106 mmol/L (ref 96–106)
Creatinine, Ser: 0.82 mg/dL (ref 0.57–1.00)
GFR calc Af Amer: 91 mL/min/{1.73_m2} (ref >59)
GFR calc non Af Amer: 79 mL/min/{1.73_m2} (ref >59)
Globulin, Total: 2 g/dL (ref 1.5–4.5)
Glucose: 79 mg/dL (ref 65–99)
Potassium: 4.8 mmol/L (ref 3.5–5.2)
Sodium: 143 mmol/L (ref 134–144)
Total Protein: 6.3 g/dL (ref 6.0–8.5)

## 2020-06-04 LAB — FERRITIN: Ferritin: 57 ng/mL (ref 15–150)

## 2020-06-04 LAB — CBC
Hematocrit: 44.5 % (ref 34.0–46.6)
Hemoglobin: 14.9 g/dL (ref 11.1–15.9)
MCH: 29.6 pg (ref 26.6–33.0)
MCHC: 33.5 g/dL (ref 31.5–35.7)
MCV: 89 fL (ref 79–97)
Platelets: 318 10*3/uL (ref 150–450)
RBC: 5.03 x10E6/uL (ref 3.77–5.28)
RDW: 12.3 % (ref 11.7–15.4)
WBC: 7.4 10*3/uL (ref 3.4–10.8)

## 2020-06-04 LAB — VITAMIN B12: Vitamin B-12: 419 pg/mL (ref 232–1245)

## 2020-06-04 LAB — TSH: TSH: 1.02 u[IU]/mL (ref 0.450–4.500)

## 2020-06-07 LAB — TOXASSURE SELECT 13 (MW), URINE

## 2020-06-08 ENCOUNTER — Other Ambulatory Visit: Payer: Self-pay | Admitting: Family Medicine

## 2020-06-08 ENCOUNTER — Telehealth: Payer: Self-pay | Admitting: Family Medicine

## 2020-06-08 DIAGNOSIS — G2581 Restless legs syndrome: Secondary | ICD-10-CM

## 2020-06-08 NOTE — Telephone Encounter (Signed)
Received message from patient via MyChart with patient stating that she is still having trouble sleeping because of her restless leg. Pt just had appt with Deliah Boston on 06/03/20. Can Britney advise on what patient can do to help or is there something that can be prescribed to patient?

## 2020-06-08 NOTE — Telephone Encounter (Signed)
Sorry routed to the wrong provider.

## 2020-06-08 NOTE — Telephone Encounter (Signed)
So I reviewed her sleep study and no restless leg syndrome was appreciated on that study.  So she does not have a true restless legs despite her feeling that way.  I wonder if her symptoms might be secondary to her medications and uncontrolled mental health disorder.  Hence, I have referred her to psychiatry.  I'm hoping that as they get her regimen under control, her symptoms will improve as well.

## 2020-06-08 NOTE — Telephone Encounter (Signed)
Patient aware and verbalized understanding. °

## 2020-06-08 NOTE — Telephone Encounter (Signed)
Patient does not agree states she aches and move constantly. She states she was fine of the day of the sleep study she would like another sleep study if insurance will pay for it.

## 2020-06-08 NOTE — Telephone Encounter (Signed)
Will be glad to place an order.  Not sure about coverage but they should be able to tell when they see her.

## 2020-06-10 ENCOUNTER — Other Ambulatory Visit: Payer: Self-pay | Admitting: *Deleted

## 2020-06-10 MED ORDER — BACLOFEN 10 MG PO TABS: 10.0000 mg | ORAL_TABLET | Freq: Two times a day (BID) | ORAL | 0 refills | Status: DC | PRN

## 2020-06-12 ENCOUNTER — Encounter: Payer: Self-pay | Admitting: Family Medicine

## 2020-06-17 ENCOUNTER — Other Ambulatory Visit: Payer: Self-pay | Admitting: Pulmonary Disease

## 2020-06-17 DIAGNOSIS — J441 Chronic obstructive pulmonary disease with (acute) exacerbation: Secondary | ICD-10-CM

## 2020-06-20 ENCOUNTER — Other Ambulatory Visit: Payer: Self-pay

## 2020-06-20 DIAGNOSIS — R2231 Localized swelling, mass and lump, right upper limb: Secondary | ICD-10-CM

## 2020-06-27 ENCOUNTER — Encounter: Payer: Self-pay | Admitting: Family Medicine

## 2020-06-27 ENCOUNTER — Other Ambulatory Visit: Payer: Self-pay

## 2020-06-27 ENCOUNTER — Ambulatory Visit (INDEPENDENT_AMBULATORY_CARE_PROVIDER_SITE_OTHER): Payer: 59 | Admitting: Family Medicine

## 2020-06-27 VITALS — BP 97/60 | HR 90 | Temp 97.9°F | Ht 65.0 in | Wt 170.4 lb

## 2020-06-27 DIAGNOSIS — R350 Frequency of micturition: Secondary | ICD-10-CM | POA: Diagnosis not present

## 2020-06-27 DIAGNOSIS — G2581 Restless legs syndrome: Secondary | ICD-10-CM

## 2020-06-27 DIAGNOSIS — F411 Generalized anxiety disorder: Secondary | ICD-10-CM | POA: Diagnosis not present

## 2020-06-27 DIAGNOSIS — G4700 Insomnia, unspecified: Secondary | ICD-10-CM

## 2020-06-27 DIAGNOSIS — F4310 Post-traumatic stress disorder, unspecified: Secondary | ICD-10-CM

## 2020-06-27 LAB — URINALYSIS, ROUTINE W REFLEX MICROSCOPIC
Bilirubin, UA: NEGATIVE
Glucose, UA: NEGATIVE
Ketones, UA: NEGATIVE
Leukocytes,UA: NEGATIVE
Nitrite, UA: NEGATIVE
Protein,UA: NEGATIVE
Specific Gravity, UA: <1.005 — ABNORMAL LOW (ref 1.005–1.030)
Urobilinogen, Ur: 0.2 mg/dL (ref 0.2–1.0)
pH, UA: 7 (ref 5.0–7.5)

## 2020-06-27 LAB — MICROSCOPIC EXAMINATION
Bacteria, UA: NONE SEEN
Epithelial Cells (non renal): NONE SEEN /hpf (ref 0–10)
WBC, UA: NONE SEEN /hpf (ref 0–5)

## 2020-06-27 NOTE — Progress Notes (Signed)
Subjective: CC: insomnia due to "restless leg" PCP: Alicia Norlander, DO JEH:UDJSHF Sanchez is a 59 y.o. female presenting to clinic today for:  1. Sensation of restless leg Patient with ongoing sensation of restless leg but has been improved by use of OTC Legatrin.  She continues to take all of her other medications.  She has not heard back from psychiatry for an appointment but is plan on canceling her neurology appointment since symptoms are getting better with the OTC medication.  She has her mammogram scheduled.  She is also been taking vitamin B12 over-the-counter.  She continues to have quite a bit of stress and pressure due to family members being ill.  She is a caretaker for them.  She often puts herself on the back burner as a result of focusing on her family first.  2.  Urinary pressure Patient reports some increased pelvic pressure with urination.  She does report urinary frequency at baseline but notes that she drinks quite a bit of water.  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  Denies any fevers, nausea, vomiting  ROS: Per HPI  Allergies  Allergen Reactions  . Doxycycline     Chest pain   Past Medical History:  Diagnosis Date  . Anxiety   . COPD (chronic obstructive pulmonary disease) ([place])   . Osteoporosis     Current Outpatient Medications:  .  albuterol (PROVENTIL HFA;VENTOLIN HFA) 108 (90 Base) MCG/ACT inhaler, Inhale 2 puffs into the lungs every 6 (six) hours as needed for wheezing or shortness of breath., Disp: 1 Inhaler, Rfl: 6 .  alendronate (FOSAMAX) 70 MG tablet, Take 1 tablet (70 mg total) by mouth every 7 (seven) days. Take with a full glass of water on an empty stomach., Disp: 4 tablet, Rfl: 11 .  ALPRAZolam (XANAX) 0.5 MG tablet, Take 0.5-1 tablets (0.25-0.5 mg total) by mouth 2 (two) times daily as needed for anxiety., Disp: 60 tablet, Rfl: 2 .  aspirin EC 81 MG tablet, Take 81 mg by mouth daily.,  Disp: , Rfl:  .  baclofen (LIORESAL) 10 MG tablet, Take 1 tablet (10 mg total) by mouth 2 (two) times daily as needed for muscle spasms., Disp: 40 tablet, Rfl: 0 .  busPIRone (BUSPAR) 30 MG tablet, Take 1 tablet (30 mg total) by mouth 3 (three) times daily., Disp: 90 tablet, Rfl: 1 .  calcium carbonate (OS-CAL) 1250 (500 Ca) MG chewable tablet, Chew 1 tablet by mouth daily., Disp: , Rfl:  .  cetirizine (ZYRTEC) 10 MG tablet, Take 1 tablet (10 mg total) by mouth daily., Disp: 90 tablet, Rfl: 2 .  diltiazem (CARDIZEM) 30 MG tablet, Take 1 tablet (30 mg total) by mouth 2 (two) times daily as needed., Disp: 180 tablet, Rfl: 2 .  doxepin (SINEQUAN) 100 MG capsule, Take 1 capsule (100 mg total) by mouth at bedtime., Disp: 30 capsule, Rfl: 2 .  DULoxetine (CYMBALTA) 60 MG capsule, Take 2 capsules (120 mg total) by mouth daily., Disp: 180 capsule, Rfl: 1 .  EPINEPHrine 0.3 mg/0.3 mL IJ SOAJ injection, Inject 0.3 mLs (0.3 mg total) into the muscle as needed for anaphylaxis., Disp: 1 Device, Rfl: 2 .  ipratropium-albuterol (DUONEB) 0.5-2.5 (3) MG/3ML SOLN, Take 3 mLs by nebulization every 6 (six) hours as needed., Disp: 360 mL, Rfl: 3 .  meclizine (ANTIVERT) 25 MG tablet, TAKE 1 TABLET BY MOUTH THREE TIMES DAILY AS NEEDED FOR DIZZINESS, Disp: 60 tablet, Rfl: 0 .  meloxicam (MOBIC) 7.5 MG tablet, Take 1 tablet (7.5 mg total) by mouth daily., Disp: 30 tablet, Rfl: 5 .  Multiple Vitamin (MULTIVITAMIN WITH MINERALS) TABS tablet, Take 1 tablet by mouth daily., Disp: , Rfl:  .  Multiple Vitamins-Minerals (VITAMIN D3 COMPLETE PO), Take by mouth 2 (two) times daily., Disp: , Rfl:  .  Respiratory Therapy Supplies (NEBULIZER/TUBING/MOUTHPIECE) KIT, 1 Units 4 (four) times daily by Does not apply route. GIVE face mask and tubing for nebulizer, Disp: 1 each, Rfl: 11 .  rOPINIRole (REQUIP) 1 MG tablet, TAKE 1 TO 4 TABLETS BY MOUTH AT BEDTIME (Patient taking differently: Take 3 mg by mouth at bedtime. TAKE 1 TO 4 TABLETS BY  MOUTH AT BEDTIME), Disp: 360 tablet, Rfl: 3 .  TRELEGY ELLIPTA 100-62.5-25 MCG/INH AEPB, INHALE 1 PUFF ONCE DAILY, Disp: 60 each, Rfl: 5 .  vitamin C (ASCORBIC ACID) 250 MG tablet, Take by mouth daily., Disp: , Rfl:  .  atorvastatin (LIPITOR) 40 MG tablet, Take 1 tablet (40 mg total) by mouth daily., Disp: 90 tablet, Rfl: 1 Social History   Socioeconomic History  . Marital status: Married    Spouse name: Not on file  . Number of children: Not on file  . Years of education: Not on file  . Highest education level: Not on file  Occupational History  . Not on file  Tobacco Use  . Smoking status: Former Smoker    Packs/day: 0.50    Years: 42.00    Pack years: 21.00    Types: Cigarettes  . Smokeless tobacco: Never Used  Vaping Use  . Vaping Use: Never used  Substance and Sexual Activity  . Alcohol use: No  . Drug use: No  . Sexual activity: Yes    Birth control/protection: Surgical  Other Topics Concern  . Not on file  Social History Narrative  . Not on file   Social Determinants of Health   Financial Resource Strain:   . Difficulty of Paying Living Expenses:   Food Insecurity:   . Worried About Charity fundraiser in the Last Year:   . Arboriculturist in the Last Year:   Transportation Needs:   . Film/video editor (Medical):   Marland Kitchen Lack of Transportation (Non-Medical):   Physical Activity:   . Days of Exercise per Week:   . Minutes of Exercise per Session:   Stress:   . Feeling of Stress :   Social Connections:   . Frequency of Communication with Friends and Family:   . Frequency of Social Gatherings with Friends and Family:   . Attends Religious Services:   . Active Member of Clubs or Organizations:   . Attends Archivist Meetings:   Marland Kitchen Marital Status:   Intimate Partner Violence:   . Fear of Current or Ex-Partner:   . Emotionally Abused:   Marland Kitchen Physically Abused:   . Sexually Abused:    Family History  Problem Relation Age of Onset  . COPD Mother    . COPD Father   . Diabetes type II Sister   . Diabetes type II Brother   . COPD Maternal Grandfather     Objective: Office vital signs reviewed. BP 97/60   Pulse 90   Temp 97.9 F (36.6 C)   Ht 5' 5" (1.651 m)   Wt 170 lb 6.4 oz (77.3 kg)   SpO2 97%   BMI 28.36 kg/m   Physical Examination:  General: Awake, alert, well nourished, No acute distress  MSK: normal gait and station Neuro: No resting tremor or twitching noted Psych: Mood stable, speech normal, after appropriate, patient is pleasant and interactive. Depression screen [facility] 2/9 06/27/2020 06/03/2020 11/24/2018  Decreased Interest 1 1 0  Down, Depressed, Hopeless 3 1 0  PHQ - 2 Score 4 2 0  Altered sleeping 3 1 -  Tired, decreased energy 3 3 -  Change in appetite 3 0 -  Feeling bad or failure about yourself  0 0 -  Trouble concentrating 0 0 -  Moving slowly or fidgety/restless 0 0 -  Suicidal thoughts 0 0 -  PHQ-9 Score 13 6 -  Difficult doing work/chores Somewhat difficult - -  Some recent data might be hidden   GAD 7 : Generalized Anxiety Score 06/27/2020 06/03/2020  Nervous, Anxious, on Edge 3 2  Control/stop worrying 1 3  Worry too much - different things 1 3  Trouble relaxing 1 1  Restless 0 0  Easily annoyed or irritable 1 0  Afraid - awful might happen 0 3  Total GAD 7 Score 7 12  Anxiety Difficulty Not difficult at all -   Assessment/ Plan: 59 y.o. female   1. Urinary frequency UA with baseline blood that has been known to patient due to history of renal disease.  She has no other evidence suggestive of infection but will send for culture to be sure - Urinalysis, Routine w reflex microscopic - Urine Culture  2. Restless legs Stable.  Encouraged her to make sure that there are no drug interactions with her OTC product and current medications  3. Insomnia, unspecified type Stable  4. Generalized anxiety disorder New referral to psychiatry placed as it appears that her previous referral was not  completed for unknown reasons.  I have coordinated her care with Loma Sousa, our referral coordinator. - Ambulatory referral to Psychiatry  5. PTSD (post-traumatic stress disorder) - Ambulatory referral to Psychiatry   Orders Placed This Encounter  Procedures  . Urinalysis, Routine w reflex microscopic   No orders of the defined types were placed in this encounter.    Alicia Norlander, DO [place] [phone]

## 2020-06-27 NOTE — Patient Instructions (Addendum)
You had labs performed today.  You will be contacted with the results of the labs once they are available, usually in the next 3 business days for routine lab work.  If you have an active my chart account, they will be released to your MyChart.  If you prefer to have these labs released to you via telephone, please let us know.  If you had a pap smear or biopsy performed, expect to be contacted in about 7-10 days.  Check with the pharmacist about the new OTC restless leg med and your current meds.  Toni Amend is following up on the psychiatry referral for you.  If you hear nothing by the end of the week, please call and ask for her.

## 2020-06-28 ENCOUNTER — Other Ambulatory Visit: Payer: Self-pay | Admitting: Family Medicine

## 2020-06-28 DIAGNOSIS — F411 Generalized anxiety disorder: Secondary | ICD-10-CM

## 2020-06-28 LAB — URINE CULTURE

## 2020-06-29 ENCOUNTER — Other Ambulatory Visit: Payer: Self-pay | Admitting: Family Medicine

## 2020-06-29 ENCOUNTER — Other Ambulatory Visit: Payer: Self-pay | Admitting: *Deleted

## 2020-06-29 DIAGNOSIS — F331 Major depressive disorder, recurrent, moderate: Secondary | ICD-10-CM

## 2020-06-29 MED ORDER — DULOXETINE HCL 60 MG PO CPEP: 120.0000 mg | ORAL_CAPSULE | Freq: Every day | ORAL | 0 refills | Status: DC

## 2020-07-07 ENCOUNTER — Ambulatory Visit (HOSPITAL_COMMUNITY)
Admission: RE | Admit: 2020-07-07 | Discharge: 2020-07-07 | Disposition: A | Discharge status: Home / Self Care | Payer: 59 | Source: Ambulatory Visit | Attending: Family Medicine | Admitting: Family Medicine

## 2020-07-07 ENCOUNTER — Encounter (HOSPITAL_COMMUNITY): Payer: 59

## 2020-07-07 ENCOUNTER — Other Ambulatory Visit: Payer: Self-pay

## 2020-07-07 DIAGNOSIS — R2231 Localized swelling, mass and lump, right upper limb: Secondary | ICD-10-CM

## 2020-07-29 ENCOUNTER — Other Ambulatory Visit: Payer: Self-pay | Admitting: *Deleted

## 2020-07-30 MED ORDER — BACLOFEN 10 MG PO TABS: 10.0000 mg | ORAL_TABLET | Freq: Two times a day (BID) | ORAL | 0 refills | Status: DC | PRN

## 2020-08-01 ENCOUNTER — Other Ambulatory Visit: Payer: Self-pay | Admitting: *Deleted

## 2020-08-01 DIAGNOSIS — M1990 Unspecified osteoarthritis, unspecified site: Secondary | ICD-10-CM

## 2020-08-01 MED ORDER — MELOXICAM 7.5 MG PO TABS: 7.5000 mg | ORAL_TABLET | Freq: Every day | ORAL | 5 refills | Status: DC

## 2020-08-04 ENCOUNTER — Encounter: Payer: Self-pay | Admitting: Neurology

## 2020-08-04 ENCOUNTER — Institutional Professional Consult (permissible substitution): Payer: 59 | Admitting: Neurology

## 2020-08-04 ENCOUNTER — Telehealth: Payer: Self-pay | Admitting: Neurology

## 2020-08-04 NOTE — Telephone Encounter (Signed)
Pt was a no show to the apt that was scheduled today

## 2020-08-05 ENCOUNTER — Other Ambulatory Visit: Payer: Self-pay | Admitting: Cardiology

## 2020-09-02 ENCOUNTER — Other Ambulatory Visit: Payer: Self-pay | Admitting: Family Medicine

## 2020-09-02 DIAGNOSIS — F411 Generalized anxiety disorder: Secondary | ICD-10-CM

## 2020-09-03 ENCOUNTER — Other Ambulatory Visit: Payer: Self-pay | Admitting: Family Medicine

## 2020-09-05 ENCOUNTER — Other Ambulatory Visit: Payer: Self-pay | Admitting: Family Medicine

## 2020-09-05 DIAGNOSIS — F411 Generalized anxiety disorder: Secondary | ICD-10-CM

## 2020-09-07 ENCOUNTER — Telehealth: Payer: Self-pay | Admitting: Family Medicine

## 2020-09-07 ENCOUNTER — Telehealth: Payer: Self-pay

## 2020-09-07 NOTE — Telephone Encounter (Signed)
  Prescription Request  09/07/2020  What is the name of the medication or equipment? baclofen (LIORESAL) 10 MG tablet  ALPRAZolam (XANAX) 0.5 MG tablet    Have you contacted your pharmacy to request a refill? (if applicable) YES  Which pharmacy would you like this sent to? [facility] [place], pt made first available appt with PCP on 10/25/2020   Patient notified that their request is being sent to the clinical staff for review and that they should receive a response within 2 business days.

## 2020-09-07 NOTE — Telephone Encounter (Signed)
Lmtcb.

## 2020-09-07 NOTE — Telephone Encounter (Signed)
REFERRAL REQUEST Telephone Note  Have you been seen at our office for this problem? Yes (Advise that they may need an appointment with their PCP before a referral can be done)  Reason for Referral:  Referral discussed with patient: Yes Best contact number of patient for referral team:    Has patient been seen by a specialist for this issue before: Yes Patient provider preference for referral: [facility] Patient location preference for referral:    Patient notified that referrals can take up to a week or longer to process. If they haven't heard anything within a week they should call back and speak with the referral department.

## 2020-09-08 ENCOUNTER — Ambulatory Visit: Payer: 59 | Admitting: Pulmonary Disease

## 2020-09-21 MED ORDER — BACLOFEN 10 MG PO TABS: 10.0000 mg | ORAL_TABLET | Freq: Two times a day (BID) | ORAL | 0 refills | Status: DC | PRN

## 2020-09-21 NOTE — Telephone Encounter (Signed)
Sent 1 rf in to last until appt  

## 2020-09-21 NOTE — Addendum Note (Signed)
Addended by: Magdalene River on: 09/21/2020 08:46 AM   Modules accepted: Orders

## 2020-09-22 ENCOUNTER — Other Ambulatory Visit: Payer: Self-pay | Admitting: Family Medicine

## 2020-09-22 DIAGNOSIS — F411 Generalized anxiety disorder: Secondary | ICD-10-CM

## 2020-09-23 ENCOUNTER — Other Ambulatory Visit: Payer: Self-pay | Admitting: *Deleted

## 2020-09-23 DIAGNOSIS — G2581 Restless legs syndrome: Secondary | ICD-10-CM

## 2020-09-23 MED ORDER — ROPINIROLE HCL 1 MG PO TABS: ORAL_TABLET | ORAL | 3 refills | Status: DC

## 2020-09-30 ENCOUNTER — Telehealth: Payer: Self-pay

## 2020-09-30 NOTE — Telephone Encounter (Signed)
Patient called stating that she has been having chest pain and feels like something is sitting on her chest that started yesterday.  Patient advised to go to ER to get evaluated.  Patient verbalized understanding and states she will go to [facility].

## 2020-10-12 ENCOUNTER — Ambulatory Visit: Payer: 59 | Admitting: Family Medicine

## 2020-10-16 ENCOUNTER — Other Ambulatory Visit: Payer: Self-pay | Admitting: Family Medicine

## 2020-10-16 DIAGNOSIS — F331 Major depressive disorder, recurrent, moderate: Secondary | ICD-10-CM

## 2020-10-25 ENCOUNTER — Ambulatory Visit (INDEPENDENT_AMBULATORY_CARE_PROVIDER_SITE_OTHER): Payer: 59 | Admitting: Family Medicine

## 2020-10-25 ENCOUNTER — Other Ambulatory Visit: Payer: Self-pay

## 2020-10-25 ENCOUNTER — Encounter: Payer: Self-pay | Admitting: Family Medicine

## 2020-10-25 VITALS — BP 105/67 | HR 99 | Temp 97.7°F | Ht 65.0 in | Wt 170.6 lb

## 2020-10-25 DIAGNOSIS — Z23 Encounter for immunization: Secondary | ICD-10-CM | POA: Diagnosis not present

## 2020-10-25 DIAGNOSIS — F411 Generalized anxiety disorder: Secondary | ICD-10-CM

## 2020-10-25 DIAGNOSIS — F4310 Post-traumatic stress disorder, unspecified: Secondary | ICD-10-CM

## 2020-10-25 DIAGNOSIS — F5104 Psychophysiologic insomnia: Secondary | ICD-10-CM | POA: Diagnosis not present

## 2020-10-25 MED ORDER — ALPRAZOLAM 0.5 MG PO TABS: 0.2500 mg | ORAL_TABLET | Freq: Two times a day (BID) | ORAL | 2 refills | Status: DC | PRN

## 2020-10-25 NOTE — Patient Instructions (Signed)
Call and ask for Toni Amend if no call for referral in 1 week.

## 2020-10-25 NOTE — Progress Notes (Signed)
Subjective: CC: Follow-up anxiety, PTSD PCP: Janora Norlander, DO YIR:SWNIOE Stough is a 59 y.o. female presenting to clinic today for:  1.  Anxiety, depression, PTSD Patient reports increased stressors at home.  She cares for her disabled father-in-law pretty much 24/7 around-the-clock.  Her husband also is very ill with advanced lung disease requiring oxygen.  She continues to take buspirone, Sinequan, Cymbalta and as needed Xanax.  She admits that she would use the Xanax daily if she could because the stress has been so bad but she does try to limit herself to no more than once per day.  Denies any excessive daytime sedation, falls, respiratory depression.  Sometimes she feels like she sees flickers of things out the corner of her eyes but never has an overt hallucination.  She never was contacted by the psychiatrist to establish care but brings in a list of a therapist and a psychiatrist in [place] that she would like to try and establish with.  Apparently her hairdresser sees these providers.  No SI or HI   ROS: Per HPI  Allergies  Allergen Reactions  . Doxycycline     Chest pain   Past Medical History:  Diagnosis Date  . Anxiety   . COPD (chronic obstructive pulmonary disease) ([place])   . Osteoporosis     Current Outpatient Medications:  .  albuterol (PROVENTIL HFA;VENTOLIN HFA) 108 (90 Base) MCG/ACT inhaler, Inhale 2 puffs into the lungs every 6 (six) hours as needed for wheezing or shortness of breath., Disp: 1 Inhaler, Rfl: 6 .  alendronate (FOSAMAX) 70 MG tablet, Take 1 tablet (70 mg total) by mouth every 7 (seven) days. Take with a full glass of water on an empty stomach., Disp: 4 tablet, Rfl: 11 .  ALPRAZolam (XANAX) 0.5 MG tablet, Take 0.5-1 tablets (0.25-0.5 mg total) by mouth 2 (two) times daily as needed for anxiety., Disp: 60 tablet, Rfl: 2 .  aspirin EC 81 MG tablet, Take 81 mg by mouth daily., Disp: , Rfl:  .  atorvastatin (LIPITOR) 40 MG tablet, Take 1  tablet by mouth once daily, Disp: 90 tablet, Rfl: 3 .  baclofen (LIORESAL) 10 MG tablet, Take 1 tablet (10 mg total) by mouth 2 (two) times daily as needed for muscle spasms., Disp: 40 tablet, Rfl: 0 .  busPIRone (BUSPAR) 30 MG tablet, TAKE 1 TABLET BY MOUTH THREE TIMES DAILY, Disp: 90 tablet, Rfl: 0 .  calcium carbonate (OS-CAL) 1250 (500 Ca) MG chewable tablet, Chew 1 tablet by mouth daily., Disp: , Rfl:  .  cetirizine (ZYRTEC) 10 MG tablet, Take 1 tablet (10 mg total) by mouth daily., Disp: 90 tablet, Rfl: 2 .  diltiazem (CARDIZEM) 30 MG tablet, Take 1 tablet (30 mg total) by mouth 2 (two) times daily as needed., Disp: 180 tablet, Rfl: 2 .  doxepin (SINEQUAN) 100 MG capsule, Take 1 capsule by mouth once daily at bedtime, Disp: 30 capsule, Rfl: 0 .  DULoxetine (CYMBALTA) 60 MG capsule, TAKE 2 CAPSULES BY MOUTH DAILY, Disp: 180 capsule, Rfl: 0 .  EPINEPHrine 0.3 mg/0.3 mL IJ SOAJ injection, Inject 0.3 mLs (0.3 mg total) into the muscle as needed for anaphylaxis., Disp: 1 Device, Rfl: 2 .  ipratropium-albuterol (DUONEB) 0.5-2.5 (3) MG/3ML SOLN, Take 3 mLs by nebulization every 6 (six) hours as needed., Disp: 360 mL, Rfl: 3 .  meclizine (ANTIVERT) 25 MG tablet, TAKE 1 TABLET BY MOUTH THREE TIMES DAILY AS NEEDED FOR DIZZINESS, Disp: 60 tablet, Rfl: 0 .  meloxicam (  MOBIC) 7.5 MG tablet, Take 1 tablet (7.5 mg total) by mouth daily., Disp: 30 tablet, Rfl: 5 .  Multiple Vitamin (MULTIVITAMIN WITH MINERALS) TABS tablet, Take 1 tablet by mouth daily., Disp: , Rfl:  .  Multiple Vitamins-Minerals (VITAMIN D3 COMPLETE PO), Take by mouth 2 (two) times daily., Disp: , Rfl:  .  Respiratory Therapy Supplies (NEBULIZER/TUBING/MOUTHPIECE) KIT, 1 Units 4 (four) times daily by Does not apply route. GIVE face mask and tubing for nebulizer, Disp: 1 each, Rfl: 11 .  rOPINIRole (REQUIP) 1 MG tablet, TAKE 1 TO 4 TABLETS BY MOUTH AT BEDTIME, Disp: 360 tablet, Rfl: 3 .  TRELEGY ELLIPTA 100-62.5-25 MCG/INH AEPB, INHALE 1 PUFF  ONCE DAILY, Disp: 60 each, Rfl: 5 .  vitamin B-12 (CYANOCOBALAMIN) 100 MCG tablet, Take 100 mcg by mouth daily., Disp: , Rfl:  .  vitamin C (ASCORBIC ACID) 250 MG tablet, Take by mouth daily., Disp: , Rfl:  Social History   Socioeconomic History  . Marital status: Married    Spouse name: Not on file  . Number of children: Not on file  . Years of education: Not on file  . Highest education level: Not on file  Occupational History  . Not on file  Tobacco Use  . Smoking status: Former Smoker    Packs/day: 0.50    Years: 42.00    Pack years: 21.00    Types: Cigarettes  . Smokeless tobacco: Never Used  Vaping Use  . Vaping Use: Never used  Substance and Sexual Activity  . Alcohol use: No  . Drug use: No  . Sexual activity: Yes    Birth control/protection: Surgical  Other Topics Concern  . Not on file  Social History Narrative  . Not on file   Social Determinants of Health   Financial Resource Strain:   . Difficulty of Paying Living Expenses: Not on file  Food Insecurity:   . Worried About Charity fundraiser in the Last Year: Not on file  . Ran Out of Food in the Last Year: Not on file  Transportation Needs:   . Lack of Transportation (Medical): Not on file  . Lack of Transportation (Non-Medical): Not on file  Physical Activity:   . Days of Exercise per Week: Not on file  . Minutes of Exercise per Session: Not on file  Stress:   . Feeling of Stress : Not on file  Social Connections:   . Frequency of Communication with Friends and Family: Not on file  . Frequency of Social Gatherings with Friends and Family: Not on file  . Attends Religious Services: Not on file  . Active Member of Clubs or Organizations: Not on file  . Attends Archivist Meetings: Not on file  . Marital Status: Not on file  Intimate Partner Violence:   . Fear of Current or Ex-Partner: Not on file  . Emotionally Abused: Not on file  . Physically Abused: Not on file  . Sexually Abused: Not  on file   Family History  Problem Relation Age of Onset  . COPD Mother   . COPD Father   . Diabetes type II Sister   . Diabetes type II Brother   . COPD Maternal Grandfather     Objective: Office vital signs reviewed. BP 105/67   Pulse 99   Temp 97.7 F (36.5 C)   Ht 5' 5" (1.651 m)   Wt 170 lb 9.6 oz (77.4 kg)   SpO2 95%   BMI 28.39 kg/m  Physical Examination:  General: Awake, alert, well nourished, No acute distress HEENT: Exotropia of the right eye, sclera white Cardio: regular rate and rhythm, S1S2 heard, no murmurs appreciated Pulm: normal work of breathing on room air Psych: Mood is stable.  Patient appears stressed. Depression screen [facility] 2/9 10/25/2020 06/27/2020 06/03/2020 11/24/2018 09/23/2018  Decreased Interest _0 0 0  Down, Depressed, Hopeless _1 0 0  PHQ - 2 Score _2 0 0  Altered sleeping _3 - -  Tired, decreased energy _4 - -  Change in appetite 2 3 0 - -  Feeling bad or failure about yourself  0 0 0 - -  Trouble concentrating 0 0 0 - -  Moving slowly or fidgety/restless 0 0 0 - -  Suicidal thoughts 0 0 0 - -  PHQ-9 Score _5 - -  Difficult doing work/chores Somewhat difficult Somewhat difficult - - -  Some recent data might be hidden   GAD 7 : Generalized Anxiety Score 10/25/2020 06/27/2020 06/03/2020  Nervous, Anxious, on Edge _6 Control/stop worrying _7 Worry too much - different things _8 Trouble relaxing _9 Restless 0 0 0  Easily annoyed or irritable 0 1 0  Afraid - awful might happen 0 0 3  Total GAD 7 Score _10 Anxiety Difficulty Somewhat difficult Not difficult at all -   Assessment/ Plan: 59 y.o. female   Generalized anxiety disorder - Plan: Ambulatory referral to Psychiatry, Ambulatory referral to Psychology, ALPRAZolam (XANAX) 0.5 MG tablet  PTSD (post-traumatic stress disorder) - Plan: Ambulatory referral to Psychiatry, Ambulatory referral to Psychology  Chronic insomnia - Plan: Ambulatory referral  to Psychiatry, Ambulatory referral to Psychology  Need for immunization against influenza - Plan: Flu Vaccine QUAD 36+ mos IM  Ongoing issues with anxiety that is increased from baseline.  She has most certainly an underlying PTSD given her personal history of trauma.  Still continues to struggle with insomnia despite multiple sedating medications.  I have placed another referral to psychiatry and psychology, this time in [place].  We will see if we can arrange these referrals.  Have also encouraged patient to contact those clinics herself to arrange an appointment.  If no call within the next week or so, I have encouraged her to call us back to inquire on that refill.  Additionally influenza vaccination was administered today.  I would like to see her back in 3 to 4 months for recheck.  The national narcotic database was unable to be reviewed due to the system being down.  Though I have a little reason to believe the patient would be diverting or misusing this medication  Orders Placed This Encounter  Procedures  . Flu Vaccine QUAD 36+ mos IM   No orders of the defined types were placed in this encounter.  Total time spent with patient 30 mins.  Greater than 50% of encounter spent in coordination of care/counseling.   Janora Norlander, DO [place] [phone]

## 2020-11-04 ENCOUNTER — Telehealth: Payer: Self-pay | Admitting: Family Medicine

## 2020-11-06 ENCOUNTER — Other Ambulatory Visit: Payer: Self-pay | Admitting: Family Medicine

## 2020-11-06 ENCOUNTER — Other Ambulatory Visit: Payer: Self-pay | Admitting: Cardiology

## 2020-11-06 DIAGNOSIS — F331 Major depressive disorder, recurrent, moderate: Secondary | ICD-10-CM

## 2020-11-06 DIAGNOSIS — J302 Other seasonal allergic rhinitis: Secondary | ICD-10-CM

## 2020-11-14 ENCOUNTER — Telehealth: Payer: 59 | Admitting: Family

## 2020-11-14 DIAGNOSIS — J441 Chronic obstructive pulmonary disease with (acute) exacerbation: Secondary | ICD-10-CM

## 2020-11-14 MED ORDER — PREDNISONE 10 MG (21) PO TBPK: ORAL_TABLET | ORAL | 0 refills | Status: DC

## 2020-11-14 MED ORDER — AZITHROMYCIN 250 MG PO TABS: ORAL_TABLET | ORAL | 0 refills | Status: DC

## 2020-11-14 MED ORDER — BENZONATATE 100 MG PO CAPS: 100.0000 mg | ORAL_CAPSULE | Freq: Three times a day (TID) | ORAL | 0 refills | Status: DC | PRN

## 2020-11-14 NOTE — Progress Notes (Signed)
We are sorry that you are not feeling well.  Here is how we plan to help!  Based on your presentation I believe you most likely have A cough due to bacteria.  When patients have a fever and a productive cough with a change in color or increased sputum production, we are concerned about bacterial bronchitis.  If left untreated it can progress to pneumonia.  If your symptoms do not improve with your treatment plan it is important that you contact your provider.   I have prescribed Azithromyin 250 mg: two tablets now and then one tablet daily for 4 additonal days    In addition you may use A non-prescription cough medication called Robitussin DAC. Take 2 teaspoons every 8 hours or Delsym: take 2 teaspoons every 12 hours., A non-prescription cough medication called Mucinex DM: take 2 tablets every 12 hours. and A prescription cough medication called Tessalon Perles 100mg. You may take 1-2 capsules every 8 hours as needed for your cough.  Prednisone 10 mg daily for 6 days (see taper instructions below)  Directions for 6 day taper: Day 1: 2 tablets before breakfast, 1 after both lunch & dinner and 2 at bedtime Day 2: 1 tab before breakfast, 1 after both lunch & dinner and 2 at bedtime Day 3: 1 tab at each meal & 1 at bedtime Day 4: 1 tab at breakfast, 1 at lunch, 1 at bedtime Day 5: 1 tab at breakfast & 1 tab at bedtime Day 6: 1 tab at breakfast   From your responses in the eVisit questionnaire you describe inflammation in the upper respiratory tract which is causing a significant cough.  This is commonly called Bronchitis and has four common causes:    Allergies  Viral Infections  Acid Reflux  Bacterial Infection Allergies, viruses and acid reflux are treated by controlling symptoms or eliminating the cause. An example might be a cough caused by taking certain blood pressure medications. You stop the cough by changing the medication. Another example might be a cough caused by acid reflux.  Controlling the reflux helps control the cough.  USE OF BRONCHODILATOR ("RESCUE") INHALERS: There is a risk from using your bronchodilator too frequently.  The risk is that over-reliance on a medication which only relaxes the muscles surrounding the breathing tubes can reduce the effectiveness of medications prescribed to reduce swelling and congestion of the tubes themselves.  Although you feel brief relief from the bronchodilator inhaler, your asthma may actually be worsening with the tubes becoming more swollen and filled with mucus.  This can delay other crucial treatments, such as oral steroid medications. If you need to use a bronchodilator inhaler daily, several times per day, you should discuss this with your provider.  There are probably better treatments that could be used to keep your asthma under control.     HOME CARE . Only take medications as instructed by your medical team. . Complete the entire course of an antibiotic. . Drink plenty of fluids and get plenty of rest. . Avoid close contacts especially the very young and the elderly . Cover your mouth if you cough or cough into your sleeve. . Always remember to wash your hands . A steam or ultrasonic humidifier can help congestion.   GET HELP RIGHT AWAY IF: . You develop worsening fever. . You become short of breath . You cough up blood. . Your symptoms persist after you have completed your treatment plan MAKE SURE YOU   Understand these instructions.    Will watch your condition.  Will get help right away if you are not doing well or get worse.  Your e-visit answers were reviewed by a board certified advanced clinical practitioner to complete your personal care plan.  Depending on the condition, your plan could have included both over the counter or prescription medications. If there is a problem please reply  once you have received a response from your provider. Your safety is important to us.  If you have drug allergies  check your prescription carefully.    You can use MyChart to ask questions about today's visit, request a non-urgent call back, or ask for a work or school excuse for 24 hours related to this e-Visit. If it has been greater than 24 hours you will need to follow up with your provider, or enter a new e-Visit to address those concerns. You will get an e-mail in the next two days asking about your experience.  I hope that your e-visit has been valuable and will speed your recovery. Thank you for using e-visits.  Approximately 5 minutes was spent documenting and reviewing patient's chart.    

## 2020-11-28 ENCOUNTER — Other Ambulatory Visit: Payer: Self-pay | Admitting: Family Medicine

## 2020-11-28 DIAGNOSIS — J302 Other seasonal allergic rhinitis: Secondary | ICD-10-CM

## 2020-12-09 DIAGNOSIS — J449 Chronic obstructive pulmonary disease, unspecified: Secondary | ICD-10-CM | POA: Diagnosis not present

## 2020-12-12 ENCOUNTER — Other Ambulatory Visit: Payer: Self-pay | Admitting: Family Medicine

## 2020-12-12 DIAGNOSIS — F411 Generalized anxiety disorder: Secondary | ICD-10-CM

## 2020-12-14 DIAGNOSIS — J449 Chronic obstructive pulmonary disease, unspecified: Secondary | ICD-10-CM | POA: Diagnosis not present

## 2021-01-06 ENCOUNTER — Other Ambulatory Visit: Payer: Self-pay

## 2021-01-06 ENCOUNTER — Ambulatory Visit (INDEPENDENT_AMBULATORY_CARE_PROVIDER_SITE_OTHER): Payer: Medicare HMO | Admitting: Adult Health

## 2021-01-06 ENCOUNTER — Encounter: Payer: Self-pay | Admitting: Adult Health

## 2021-01-06 VITALS — BP 141/81 | HR 114 | Ht 64.0 in | Wt 172.0 lb

## 2021-01-06 DIAGNOSIS — F331 Major depressive disorder, recurrent, moderate: Secondary | ICD-10-CM

## 2021-01-06 DIAGNOSIS — F411 Generalized anxiety disorder: Secondary | ICD-10-CM

## 2021-01-06 DIAGNOSIS — F410 Panic disorder [episodic paroxysmal anxiety] without agoraphobia: Secondary | ICD-10-CM | POA: Diagnosis not present

## 2021-01-06 DIAGNOSIS — F4310 Post-traumatic stress disorder, unspecified: Secondary | ICD-10-CM

## 2021-01-06 DIAGNOSIS — G4700 Insomnia, unspecified: Secondary | ICD-10-CM | POA: Diagnosis not present

## 2021-01-06 MED ORDER — ALPRAZOLAM 0.5 MG PO TABS: 0.2500 mg | ORAL_TABLET | Freq: Two times a day (BID) | ORAL | 2 refills | Status: DC | PRN
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
## 2021-01-13 ENCOUNTER — Other Ambulatory Visit: Payer: Self-pay | Admitting: *Deleted

## 2021-01-13 MED ORDER — ATORVASTATIN CALCIUM 40 MG PO TABS
40.0000 mg | ORAL_TABLET | Freq: Every day | ORAL | 0 refills | Status: DC
Start: 2021-01-13 — End: 2021-03-30

## 2021-01-13 MED ORDER — DILTIAZEM HCL 30 MG PO TABS
30.0000 mg | ORAL_TABLET | Freq: Two times a day (BID) | ORAL | 0 refills | Status: DC | PRN
Start: 2021-01-13 — End: 2021-03-30

## 2021-01-14 DIAGNOSIS — J449 Chronic obstructive pulmonary disease, unspecified: Secondary | ICD-10-CM | POA: Diagnosis not present

## 2021-01-20 ENCOUNTER — Other Ambulatory Visit: Payer: Self-pay | Admitting: *Deleted

## 2021-01-20 ENCOUNTER — Other Ambulatory Visit: Payer: Self-pay | Admitting: Family Medicine

## 2021-01-20 DIAGNOSIS — F331 Major depressive disorder, recurrent, moderate: Secondary | ICD-10-CM

## 2021-01-20 DIAGNOSIS — F411 Generalized anxiety disorder: Secondary | ICD-10-CM

## 2021-01-20 DIAGNOSIS — Z0100 Encounter for examination of eyes and vision without abnormal findings: Secondary | ICD-10-CM | POA: Diagnosis not present

## 2021-01-20 DIAGNOSIS — M1990 Unspecified osteoarthritis, unspecified site: Secondary | ICD-10-CM

## 2021-01-20 DIAGNOSIS — H524 Presbyopia: Secondary | ICD-10-CM | POA: Diagnosis not present

## 2021-01-20 DIAGNOSIS — G2581 Restless legs syndrome: Secondary | ICD-10-CM

## 2021-01-20 MED ORDER — BUSPIRONE HCL 30 MG PO TABS: 30.0000 mg | ORAL_TABLET | Freq: Three times a day (TID) | ORAL | 0 refills | Status: DC

## 2021-01-20 MED ORDER — ROPINIROLE HCL 1 MG PO TABS: ORAL_TABLET | ORAL | 0 refills | Status: DC

## 2021-01-20 MED ORDER — DULOXETINE HCL 60 MG PO CPEP: 120.0000 mg | ORAL_CAPSULE | Freq: Every day | ORAL | 0 refills | Status: DC

## 2021-01-20 MED ORDER — MELOXICAM 7.5 MG PO TABS: 7.5000 mg | ORAL_TABLET | Freq: Every day | ORAL | 0 refills | Status: DC

## 2021-01-20 MED ORDER — ALENDRONATE SODIUM 70 MG PO TABS
70.0000 mg | ORAL_TABLET | ORAL | 0 refills | Status: DC
Start: 2021-01-20 — End: 2021-03-31

## 2021-01-20 MED ORDER — MECLIZINE HCL 25 MG PO TABS: ORAL_TABLET | ORAL | 0 refills | Status: DC

## 2021-01-23 ENCOUNTER — Other Ambulatory Visit: Payer: Self-pay

## 2021-01-23 DIAGNOSIS — F411 Generalized anxiety disorder: Secondary | ICD-10-CM

## 2021-01-23 MED ORDER — ALPRAZOLAM 0.5 MG PO TABS: 0.2500 mg | ORAL_TABLET | Freq: Two times a day (BID) | ORAL | 0 refills | Status: DC | PRN

## 2021-01-24 ENCOUNTER — Telehealth: Payer: Self-pay | Admitting: *Deleted

## 2021-01-24 DIAGNOSIS — F411 Generalized anxiety disorder: Secondary | ICD-10-CM

## 2021-01-24 MED ORDER — BUSPIRONE HCL 30 MG PO TABS: 30.0000 mg | ORAL_TABLET | Freq: Two times a day (BID) | ORAL | 3 refills | Status: DC

## 2021-01-24 NOTE — Telephone Encounter (Signed)
Fax from [facility] RE: Buspirone 30 mg 1 TID Note: exceeds max recommended dose of 60mg /day Please clarify

## 2021-01-24 NOTE — Telephone Encounter (Signed)
Rx corrected and sent to [facility]

## 2021-01-24 NOTE — Telephone Encounter (Signed)
Agree this should be 30mg  BID.  Please change. #180 with 3 RF

## 2021-01-25 ENCOUNTER — Other Ambulatory Visit: Payer: Self-pay | Admitting: Nurse Practitioner

## 2021-01-25 DIAGNOSIS — M1990 Unspecified osteoarthritis, unspecified site: Secondary | ICD-10-CM

## 2021-01-27 ENCOUNTER — Other Ambulatory Visit: Payer: Self-pay

## 2021-01-27 ENCOUNTER — Encounter: Payer: Self-pay | Admitting: Pulmonary Disease

## 2021-01-27 ENCOUNTER — Ambulatory Visit: Payer: Medicare HMO | Admitting: Pulmonary Disease

## 2021-01-27 VITALS — BP 116/76 | HR 108 | Temp 97.8°F | Ht 64.5 in | Wt 174.2 lb

## 2021-01-27 DIAGNOSIS — F331 Major depressive disorder, recurrent, moderate: Secondary | ICD-10-CM

## 2021-01-27 DIAGNOSIS — J441 Chronic obstructive pulmonary disease with (acute) exacerbation: Secondary | ICD-10-CM | POA: Diagnosis not present

## 2021-01-27 DIAGNOSIS — F17210 Nicotine dependence, cigarettes, uncomplicated: Secondary | ICD-10-CM | POA: Diagnosis not present

## 2021-01-27 DIAGNOSIS — J449 Chronic obstructive pulmonary disease, unspecified: Secondary | ICD-10-CM

## 2021-01-27 MED ORDER — TRELEGY ELLIPTA 100-62.5-25 MCG/INH IN AEPB: INHALATION_SPRAY | RESPIRATORY_TRACT | 1 refills | Status: DC

## 2021-01-27 MED ORDER — ALBUTEROL SULFATE HFA 108 (90 BASE) MCG/ACT IN AERS: 2.0000 | INHALATION_SPRAY | Freq: Four times a day (QID) | RESPIRATORY_TRACT | 2 refills | Status: DC | PRN

## 2021-01-27 MED ORDER — IPRATROPIUM-ALBUTEROL 0.5-2.5 (3) MG/3ML IN SOLN: 3.0000 mL | Freq: Four times a day (QID) | RESPIRATORY_TRACT | 2 refills | Status: DC | PRN

## 2021-01-27 MED ORDER — CHANTIX STARTING MONTH PAK 0.5 MG X 11 & 1 MG X 42 PO TABS: ORAL_TABLET | ORAL | 0 refills | Status: DC

## 2021-01-27 MED ORDER — NICOTINE 21 MG/24HR TD PT24: 21.0000 mg | MEDICATED_PATCH | Freq: Every day | TRANSDERMAL | 0 refills | Status: DC

## 2021-01-27 NOTE — Progress Notes (Signed)
Alicia Sanchez    [id number]    [birth year]  Primary Care Physician:Gottschalk, Koleen Distance, DO  Referring Physician: Janora Norlander, DO [place],  [place] [address]  Chief complaint:   Follow-up for COPD  HPI: [name] is a 60 year old active smoker with history of anxiety, depression, COPD.  Initially treated with Memory Dance and Incruse which was consolidated to Trelegy inhaler in 2018  She has daily symptoms of snoring. She does sleep study 4 years ago but does not know the result of this test. She is a diagnosis of restless leg syndrome and is on requip. She did not have the repeat sleep study done as it was not covered by insurance.  Pets:Dog which lives outside the house. No pets, exotic pets. Occupation:Customer sevice rep. previously worked in [employer] Exposures: His current dust exposure and previous exposure to cotton fiber in her line of work Smoking history: 35-pack-year smoking history. Continues to smoke 1 pack per day  Interim history: Continues on Trelegy, nebs She quit smoking in 2021 but unfortunately started again due to family stresses  Continues to have dyspnea on exertion, chronic cough with mucus.  No fevers or chills  Outpatient Encounter Medications as of 01/27/2021  Medication Sig  . albuterol (PROVENTIL HFA;VENTOLIN HFA) 108 (90 Base) MCG/ACT inhaler Inhale 2 puffs into the lungs every 6 (six) hours as needed for wheezing or shortness of breath.  Marland Kitchen alendronate (FOSAMAX) 70 MG tablet Take 1 tablet (70 mg total) by mouth every 7 (seven) days. Take with a full glass of water on an empty stomach.  . ALPRAZolam (XANAX) 0.5 MG tablet Take 0.5-1 tablets (0.25-0.5 mg total) by mouth 2 (two) times daily as needed for anxiety.  Marland Kitchen aspirin EC 81 MG tablet Take 81 mg by mouth daily.  Marland Kitchen atorvastatin (LIPITOR) 40 MG tablet Take 1 tablet (40 mg total) by mouth daily.  Marland Kitchen azithromycin (ZITHROMAX) 250 MG tablet Take 500 mg once, then 250 mg for four  days  . busPIRone (BUSPAR) 30 MG tablet Take 1 tablet (30 mg total) by mouth 2 (two) times daily.  . calcium carbonate (OS-CAL) 1250 (500 Ca) MG chewable tablet Chew 1 tablet by mouth daily.  Marland Kitchen diltiazem (CARDIZEM) 30 MG tablet Take 1 tablet (30 mg total) by mouth 2 (two) times daily as needed.  . doxepin (SINEQUAN) 100 MG capsule Take 1 capsule by mouth once daily at bedtime  . DULoxetine (CYMBALTA) 60 MG capsule Take 2 capsules by mouth once daily  . EPINEPHrine 0.3 mg/0.3 mL IJ SOAJ injection Inject 0.3 mLs (0.3 mg total) into the muscle as needed for anaphylaxis.  Noelle Penner ALLERGY RELIEF, CETIRIZINE, 10 MG tablet Take 1 tablet by mouth once daily  . ipratropium-albuterol (DUONEB) 0.5-2.5 (3) MG/3ML SOLN Take 3 mLs by nebulization every 6 (six) hours as needed.  . meclizine (ANTIVERT) 25 MG tablet TAKE 1 TABLET BY MOUTH THREE TIMES DAILY AS NEEDED FOR DIZZINESS  . meloxicam (MOBIC) 7.5 MG tablet Take 1 tablet (7.5 mg total) by mouth daily.  . Multiple Vitamin (MULTIVITAMIN WITH MINERALS) TABS tablet Take 1 tablet by mouth daily.  . Multiple Vitamins-Minerals (VITAMIN D3 COMPLETE PO) Take by mouth 2 (two) times daily.  Marland Kitchen Respiratory Therapy Supplies (NEBULIZER/TUBING/MOUTHPIECE) KIT 1 Units 4 (four) times daily by Does not apply route. GIVE face mask and tubing for nebulizer  . rOPINIRole (REQUIP) 1 MG tablet TAKE 1 TO 4 TABLETS BY MOUTH AT BEDTIME  .  TRELEGY ELLIPTA 100-62.5-25 MCG/INH AEPB INHALE 1 PUFF ONCE DAILY  . vitamin B-12 (CYANOCOBALAMIN) 100 MCG tablet Take 100 mcg by mouth daily.  . vitamin C (ASCORBIC ACID) 250 MG tablet Take by mouth daily.   No facility-administered encounter medications on file as of 01/27/2021.   Physical Exam: Blood pressure 116/76, pulse (!) 108, temperature 97.8 F (36.6 C), temperature source Temporal, height 5' 4.5" (1.638 m), weight 174 lb 3.2 oz (79 kg), SpO2 96 %. Gen:      No acute distress HEENT:  EOMI, sclera anicteric Neck:     No masses; no  thyromegaly Lungs:    Clear to auscultation bilaterally; normal respiratory effort CV:         Regular rate and rhythm; no murmurs Abd:      + bowel sounds; soft, non-tender; no palpable masses, no distension Ext:    No edema; adequate peripheral perfusion Skin:      Warm and dry; no rash Neuro: alert and oriented x 3 Psych: normal mood and affect  Data Reviewed: Imaging Screening CT chest 05/08/17-centrilobular emphysema, calcified granuloma, tiny subcentimeter pulmonary nodules. CTA 05/21/2018- emphysema, scattered pulmonary nodule.  4 mm right lower lung nodule is new. I have reviewed the images personally  CTA [facility]) 03/11/2019-no pulmonary embolism.  No active cardiopulmonary disease.  Coronary artery calcification No images available in PACS  Screening CT 12/17/2019-emphysema, tiny calcified pulmonary nodules  CTA 02/09/2020-no PE, lungs are clear with emphysema. I have reviewed the images personally.  PFTs 07/24/17 FVC 2.54 (74%], FEV1 1.55 (58%], F/F 61, TLC 103%, DLCO 64% Moderate obstruction and diffusion impairment with air trapping  Labs CBC/1/17-absolute eosinophil count 0 Alpha-1 antitrypsin 04/25/2017-136, PI MM  Assessment:  COPD, chronic bronchitis Currently on trelegy, albuterol Nebs as needed  Active smoker Discussed smoking cessation again.  She is willing to try to quit Prescription to clinic in patches Time spent counseling-5 minutes.  Reassess at return visit  Continue low-dose screening CT   Restless leg syndrome, suspected sleep apnea On requip for restless leg syndrome.  Sleep study not done as it is not covered by insurance.  Plan/Recommendations: - Continue trelegy duo nebs as needed - Low dose screening CT of the chest  Marshell Garfinkel MD [facility] 01/27/2021, 4:20 PM  CC: Janora Norlander, DO

## 2021-01-27 NOTE — Patient Instructions (Signed)
Continue trelegy and nebs Work on smoking cessation We will order Chantix and nicotine patches  He had a screening CT of the chest already scheduled for later this year  Follow-up in 6 months

## 2021-02-01 ENCOUNTER — Other Ambulatory Visit: Payer: Self-pay | Admitting: Pulmonary Disease

## 2021-02-01 DIAGNOSIS — J441 Chronic obstructive pulmonary disease with (acute) exacerbation: Secondary | ICD-10-CM

## 2021-02-11 DIAGNOSIS — J449 Chronic obstructive pulmonary disease, unspecified: Secondary | ICD-10-CM | POA: Diagnosis not present

## 2021-02-24 ENCOUNTER — Ambulatory Visit (INDEPENDENT_AMBULATORY_CARE_PROVIDER_SITE_OTHER): Payer: Medicare HMO | Admitting: Family Medicine

## 2021-02-24 ENCOUNTER — Other Ambulatory Visit: Payer: Self-pay

## 2021-02-24 ENCOUNTER — Encounter: Payer: Self-pay | Admitting: Family Medicine

## 2021-02-24 VITALS — BP 105/68 | HR 97 | Temp 97.1°F | Ht 64.5 in | Wt 178.6 lb

## 2021-02-24 DIAGNOSIS — F5104 Psychophysiologic insomnia: Secondary | ICD-10-CM

## 2021-02-24 DIAGNOSIS — J441 Chronic obstructive pulmonary disease with (acute) exacerbation: Secondary | ICD-10-CM | POA: Diagnosis not present

## 2021-02-24 DIAGNOSIS — M4802 Spinal stenosis, cervical region: Secondary | ICD-10-CM

## 2021-02-24 MED ORDER — RAMELTEON 8 MG PO TABS: 8.0000 mg | ORAL_TABLET | Freq: Every day | ORAL | 0 refills | Status: DC

## 2021-02-24 MED ORDER — CALCIUM CARBONATE 1250 (500 CA) MG PO CHEW: 1.0000 | CHEWABLE_TABLET | Freq: Every day | ORAL | 3 refills | Status: DC

## 2021-02-24 MED ORDER — PREDNISONE 20 MG PO TABS: 40.0000 mg | ORAL_TABLET | Freq: Every day | ORAL | 0 refills | Status: AC

## 2021-02-24 MED ORDER — DOXEPIN HCL 50 MG PO CAPS: 50.0000 mg | ORAL_CAPSULE | Freq: Every day | ORAL | 0 refills | Status: DC

## 2021-02-24 NOTE — Patient Instructions (Signed)
Wean from Doxepin if not helpful.  50mg  sent to pharmacy  The Rozerem has been prescribed but will need a prior authorization.  Give it a few days for to work it out with [place].  If sleep is still not controlled when you see your psychiatrist, please let her know so she can discuss alternatives with you  Neurosurgery referral placed.  Call and ask for [employer] [employer] if you do not hear anything in the next week or so

## 2021-02-24 NOTE — Progress Notes (Signed)
Subjective: CC: Mood disorder PCP: Janora Norlander, DO FUX:NATFTD Fehrenbach is a 60 y.o. female presenting to clinic today for:  1.  Mood disorder/PTSD Since our last visit, patient has established with a psychiatrist.  She really likes her and has a follow-up visit with her soon.  She does note that she continues to have difficulty sleeping despite increased dose of doxepin.  She tried melatonin failed this as well.  She had trazodone in the past and felt that she had a reaction to it but cannot quite remember what.  She is on 120 mg of Cymbalta daily as well as Xanax 0.5 mg twice daily, she is also on BuSpar 30 mg twice daily.  2.  DDD of the C-spine Patient with known degenerative changes within the C-spine.  She in fact was supposed to undergo surgical correction of this with Dr. Lorin Mercy last year but apparently this was canceled due to her smoking status and insurance not covering as a result.  She continues to have quite a bit of pain and issues with this and is asking for a new referral to neurosurgery.  3.  Tobacco use disorder/COPD Patient is compliant with Trelegy.  She needs new tubing for her albuterol nebulizer.  She continues to smoke.  She is more wheezy than normal today.  No reports of fever or hemoptysis.  ROS: Per HPI  Allergies  Allergen Reactions  . Doxycycline     Chest pain   Past Medical History:  Diagnosis Date  . Anxiety   . COPD (chronic obstructive pulmonary disease) ([place])   . Osteoporosis     Current Outpatient Medications:  .  albuterol (VENTOLIN HFA) 108 (90 Base) MCG/ACT inhaler, Inhale 2 puffs into the lungs every 6 (six) hours as needed for wheezing or shortness of breath., Disp: 3 each, Rfl: 2 .  alendronate (FOSAMAX) 70 MG tablet, Take 1 tablet (70 mg total) by mouth every 7 (seven) days. Take with a full glass of water on an empty stomach., Disp: 12 tablet, Rfl: 0 .  ALPRAZolam (XANAX) 0.5 MG tablet, Take 0.5-1 tablets (0.25-0.5 mg total) by  mouth 2 (two) times daily as needed for anxiety., Disp: 180 tablet, Rfl: 0 .  aspirin EC 81 MG tablet, Take 81 mg by mouth daily., Disp: , Rfl:  .  atorvastatin (LIPITOR) 40 MG tablet, Take 1 tablet (40 mg total) by mouth daily., Disp: 90 tablet, Rfl: 0 .  busPIRone (BUSPAR) 30 MG tablet, Take 1 tablet (30 mg total) by mouth 2 (two) times daily., Disp: 180 tablet, Rfl: 3 .  calcium carbonate (OS-CAL) 1250 (500 Ca) MG chewable tablet, Chew 1 tablet by mouth daily., Disp: , Rfl:  .  diltiazem (CARDIZEM) 30 MG tablet, Take 1 tablet (30 mg total) by mouth 2 (two) times daily as needed., Disp: 180 tablet, Rfl: 0 .  doxepin (SINEQUAN) 100 MG capsule, Take 1 capsule by mouth once daily at bedtime, Disp: 90 capsule, Rfl: 1 .  DULoxetine (CYMBALTA) 60 MG capsule, Take 2 capsules by mouth once daily, Disp: 180 capsule, Rfl: 0 .  EPINEPHrine 0.3 mg/0.3 mL IJ SOAJ injection, Inject 0.3 mLs (0.3 mg total) into the muscle as needed for anaphylaxis., Disp: 1 Device, Rfl: 2 .  EQ ALLERGY RELIEF, CETIRIZINE, 10 MG tablet, Take 1 tablet by mouth once daily, Disp: 90 tablet, Rfl: 0 .  ipratropium-albuterol (DUONEB) 0.5-2.5 (3) MG/3ML SOLN, Take 3 mLs by nebulization every 6 (six) hours as needed., Disp: 1080 mL, Rfl:  2 .  meclizine (ANTIVERT) 25 MG tablet, TAKE 1 TABLET BY MOUTH THREE TIMES DAILY AS NEEDED FOR DIZZINESS, Disp: 180 tablet, Rfl: 0 .  meloxicam (MOBIC) 7.5 MG tablet, Take 1 tablet (7.5 mg total) by mouth daily., Disp: 90 tablet, Rfl: 0 .  Multiple Vitamin (MULTIVITAMIN WITH MINERALS) TABS tablet, Take 1 tablet by mouth daily., Disp: , Rfl:  .  Multiple Vitamins-Minerals (VITAMIN D3 COMPLETE PO), Take by mouth 2 (two) times daily., Disp: , Rfl:  .  nicotine (NICODERM CQ - DOSED IN MG/24 HOURS) 21 mg/24hr patch, Place 1 patch (21 mg total) onto the skin daily., Disp: 28 patch, Rfl: 0 .  Respiratory Therapy Supplies (NEBULIZER/TUBING/MOUTHPIECE) KIT, 1 Units 4 (four) times daily by Does not apply route. GIVE  face mask and tubing for nebulizer, Disp: 1 each, Rfl: 11 .  rOPINIRole (REQUIP) 1 MG tablet, TAKE 1 TO 4 TABLETS BY MOUTH AT BEDTIME, Disp: 360 tablet, Rfl: 0 .  TRELEGY ELLIPTA 100-62.5-25 MCG/INH AEPB, INHALE 1 PUFF ONCE DAILY, Disp: 180 each, Rfl: 1 .  varenicline (CHANTIX STARTING MONTH PAK) 0.5 MG X 11 & 1 MG X 42 tablet, Take 1 0.5 mg tablet once daily for 3 days, increase to 1 0.5 mg tablet twice daily for 4 days, increase to 1 1 mg tablet twice daily., Disp: 53 tablet, Rfl: 0 .  vitamin B-12 (CYANOCOBALAMIN) 100 MCG tablet, Take 100 mcg by mouth daily., Disp: , Rfl:  .  vitamin C (ASCORBIC ACID) 250 MG tablet, Take by mouth daily., Disp: , Rfl:  Social History   Socioeconomic History  . Marital status: Married    Spouse name: Not on file  . Number of children: Not on file  . Years of education: Not on file  . Highest education level: Not on file  Occupational History  . Not on file  Tobacco Use  . Smoking status: Former Smoker    Packs/day: 0.50    Years: 42.00    Pack years: 21.00    Types: Cigarettes  . Smokeless tobacco: Never Used  Vaping Use  . Vaping Use: Never used  Substance and Sexual Activity  . Alcohol use: No  . Drug use: No  . Sexual activity: Yes    Birth control/protection: Surgical  Other Topics Concern  . Not on file  Social History Narrative  . Not on file   Social Determinants of Health   Financial Resource Strain: Not on file  Food Insecurity: Not on file  Transportation Needs: Not on file  Physical Activity: Not on file  Stress: Not on file  Social Connections: Not on file  Intimate Partner Violence: Not on file   Family History  Problem Relation Age of Onset  . COPD Mother   . COPD Father   . Diabetes type II Sister   . Diabetes type II Brother   . COPD Maternal Grandfather     Objective: Office vital signs reviewed. BP 105/68   Pulse 97   Temp (!) 97.1 F (36.2 C)   Ht 5' 4.5" (1.638 m)   Wt 178 lb 9.6 oz (81 kg)   SpO2 95%    BMI 30.18 kg/m   Physical Examination:  General: Awake, alert, well nourished, No acute distress Cardio: regular rate and rhythm, S1S2 heard, no murmurs appreciated Pulm: Globally decreased breath sounds with expiratory wheezes.  No rhonchi or rales.  Normal work of breathing on room air MSK: Ambulating independently.  Normal tone Psych: Mood is stable.  Patient is interactive.  Thought process linear  Assessment/ Plan: 60 y.o. female   Degenerative cervical spinal stenosis - Plan: Ambulatory referral to Neurosurgery  Psychophysiological insomnia - Plan: doxepin (SINEQUAN) 50 MG capsule, ramelteon (ROZEREM) 8 MG tablet  COPD exacerbation (HCC) - Plan: predniSONE (DELTASONE) 20 MG tablet  Referral to surgery placed.  MRI last obtained in January 2021.  Hopefully this is still useful to the neurosurgeon  We will trial Rozerem but I have started a wean of the doxepin given polypharmacy.  I suspect we will have to proceed with prior authorization but she has indeed failed trazodone, doxepin, Xanax, melatonin OTC as well as control with anxiolytic such as BuSpar and Cymbalta.  She is currently in the care of psychiatry who is managing her psychiatric medications  I am not quite sure that she totally has a real COPD exacerbation versus is her COPD simply not well controlled secondary to ongoing smoking.  I have given her some tubing from our office today to use with her nebulizer machine.  I placed her on a short burst of prednisone in efforts to improve her lung function.  If symptoms persist, need to consider addition of antibiotics as well.  She did not report a productive cough that is outside of her normal today  No orders of the defined types were placed in this encounter.  No orders of the defined types were placed in this encounter.    Janora Norlander, DO [place] [phone]

## 2021-02-27 ENCOUNTER — Telehealth: Payer: Self-pay | Admitting: *Deleted

## 2021-02-27 DIAGNOSIS — F5104 Psychophysiologic insomnia: Secondary | ICD-10-CM

## 2021-02-27 NOTE — Telephone Encounter (Signed)
Key: [id number]  Additional Information Required Drug Ramelteon 8MG  tablets  Waiting on clinical questions

## 2021-02-27 NOTE — Telephone Encounter (Signed)
Sent to plan  Questions answered

## 2021-02-28 NOTE — Telephone Encounter (Signed)
Approvedon April 11 PA Case: [id number], Status: Approved, Coverage Starts on: 11/19/2020 12:00:00 AM, Coverage Ends on: 11/18/2021 12:00:00 AM. Questions? Contact [phone]. Drug Ramelteon 8MG  tablets Form [employer] Electronic PA Form  Pharmacy aware

## 2021-03-01 DIAGNOSIS — M47812 Spondylosis without myelopathy or radiculopathy, cervical region: Secondary | ICD-10-CM | POA: Diagnosis not present

## 2021-03-01 DIAGNOSIS — M5412 Radiculopathy, cervical region: Secondary | ICD-10-CM | POA: Diagnosis not present

## 2021-03-02 ENCOUNTER — Other Ambulatory Visit: Payer: Self-pay | Admitting: *Deleted

## 2021-03-02 DIAGNOSIS — F17210 Nicotine dependence, cigarettes, uncomplicated: Secondary | ICD-10-CM

## 2021-03-07 ENCOUNTER — Ambulatory Visit: Payer: Medicare HMO | Admitting: Adult Health

## 2021-03-14 DIAGNOSIS — J449 Chronic obstructive pulmonary disease, unspecified: Secondary | ICD-10-CM | POA: Diagnosis not present

## 2021-03-15 ENCOUNTER — Other Ambulatory Visit: Payer: Self-pay | Admitting: Family Medicine

## 2021-03-15 NOTE — Telephone Encounter (Signed)
Pt did not request, pharmacy must have. Pt aware of Dr. Delynn Flavin instructions that medication is for Prn use and if she needs more she will make an appointment

## 2021-03-15 NOTE — Telephone Encounter (Signed)
This is a PRN medication.  Should not require refills without reevaluation.  Please inquire as to whether patient is actually requesting or if Mail order is simply doing an autofill.

## 2021-03-27 ENCOUNTER — Other Ambulatory Visit: Payer: Self-pay | Admitting: Family Medicine

## 2021-03-27 DIAGNOSIS — F5104 Psychophysiologic insomnia: Secondary | ICD-10-CM

## 2021-03-27 MED ORDER — RAMELTEON 8 MG PO TABS: 8.0000 mg | ORAL_TABLET | Freq: Every day | ORAL | 3 refills | Status: DC

## 2021-03-30 ENCOUNTER — Other Ambulatory Visit: Payer: Self-pay | Admitting: Cardiology

## 2021-03-31 ENCOUNTER — Other Ambulatory Visit: Payer: Self-pay | Admitting: Family Medicine

## 2021-04-03 ENCOUNTER — Other Ambulatory Visit: Payer: Self-pay | Admitting: Family Medicine

## 2021-04-03 ENCOUNTER — Other Ambulatory Visit: Payer: Self-pay

## 2021-04-03 ENCOUNTER — Other Ambulatory Visit: Payer: Self-pay | Admitting: Adult Health

## 2021-04-03 ENCOUNTER — Ambulatory Visit (HOSPITAL_COMMUNITY)
Admission: RE | Admit: 2021-04-03 | Discharge: 2021-04-03 | Disposition: A | Discharge status: Home / Self Care | Payer: Medicare HMO | Source: Ambulatory Visit | Attending: Acute Care | Admitting: Acute Care

## 2021-04-03 DIAGNOSIS — F331 Major depressive disorder, recurrent, moderate: Secondary | ICD-10-CM

## 2021-04-03 DIAGNOSIS — F411 Generalized anxiety disorder: Secondary | ICD-10-CM

## 2021-04-03 DIAGNOSIS — F17210 Nicotine dependence, cigarettes, uncomplicated: Secondary | ICD-10-CM | POA: Insufficient documentation

## 2021-04-03 DIAGNOSIS — Z87891 Personal history of nicotine dependence: Secondary | ICD-10-CM | POA: Diagnosis not present

## 2021-04-04 ENCOUNTER — Encounter: Payer: Self-pay | Admitting: Adult Health

## 2021-04-04 ENCOUNTER — Ambulatory Visit (INDEPENDENT_AMBULATORY_CARE_PROVIDER_SITE_OTHER): Payer: Medicare HMO | Admitting: Adult Health

## 2021-04-04 DIAGNOSIS — F411 Generalized anxiety disorder: Secondary | ICD-10-CM | POA: Diagnosis not present

## 2021-04-04 DIAGNOSIS — F410 Panic disorder [episodic paroxysmal anxiety] without agoraphobia: Secondary | ICD-10-CM | POA: Diagnosis not present

## 2021-04-04 MED ORDER — ALPRAZOLAM 0.5 MG PO TABS: 0.2500 mg | ORAL_TABLET | Freq: Two times a day (BID) | ORAL | 2 refills | Status: DC | PRN

## 2021-04-04 NOTE — Progress Notes (Signed)
Para Cossey [id number] 20-Sep-1961 60 y.o.  Subjective:   Patient ID:  Alicia Sanchez is a 60 y.o. (DOB 1961/07/05) female.  Chief Complaint: No chief complaint on file.   HPI Alicia Sanchez presents to the office today for follow-up of GAD and panic attacks.  Describes mood today as "so-so". Pleasant. Decreased "crying spells" - "I've had a few". Mood symptoms - reports depression, anxiety, and irritability. Reports panic attacks - "I had one bad one". Feels like Xanax works well when needed. Stating "I'm hanging in there". Continues to take care of father in law. Continues to see PCP for psychotropic medications. She and husband doing well. Recent trip with husband to [facility] - "we had a good time". PTSD from childhood and adult life". Feels like current medications are helpful, but she could feel "better". Varying interest and motivation. Taking medications as prescribed.  Energy levels low. Active, does not have a regular exercise routine.   Enjoys some usual interests and activities. Married. Lives with husband (65rd) for 17 years. Has two daughters and 2 step daughters. Has 3 sisters and 2 brothers. Mother living - talks to her. Spending time with family. Appetite adequate. Weight gain - 172 pounds. Sleeps better some nights than others - staying up most of the night. Averages 4 hours - "if I'm lucky". Denies daytime napping.  Focus and concentration difficulties. Completing tasks. Managing aspects of household. Disabled since 2020.  Denies SI or HI.  Denies AH. Hears music coming from the walls - "all the time".  Previous medication trials: Paxil, Wellbutrin, Depakote, others   GAD-7   Flowsheet Row Office Visit from 02/24/2021 in [place] Visit from 10/25/2020 in [place] Visit from 06/27/2020 in [place] Visit from 06/03/2020 in [place]  Total GAD-7 Score _0 PHQ2-9   [place] Visit from 02/24/2021 in [place] Office Visit from 10/25/2020 in [place] Office Visit from 06/27/2020 in [place] Office Visit from 06/03/2020 in [place] Office Visit from 11/24/2018 in [place]  PHQ-2 Total Score _1 0  PHQ-9 Total Score _2 --       Review of Systems:  Review of Systems  Musculoskeletal: Negative for gait problem.  Neurological: Negative for tremors.  Psychiatric/Behavioral:       Please refer to HPI    Medications: I have reviewed the patient's current medications.  Current Outpatient Medications  Medication Sig Dispense Refill  . albuterol (VENTOLIN HFA) 108 (90 Base) MCG/ACT inhaler Inhale 2 puffs into the lungs every 6 (six) hours as needed for wheezing or shortness of breath. 3 each 2  . alendronate (FOSAMAX) 70 MG tablet TAKE 1 TABLET EVERY 7 DAYS WITH A FULL GLASS OF WATER ON AN EMPTY STOMACH 12 tablet 0  . ALPRAZolam (XANAX) 0.5 MG tablet Take 0.5-1 tablets (0.25-0.5 mg total) by mouth 2 (two) times daily as needed for anxiety. 60 tablet 2  . aspirin EC 81 MG tablet Take 81 mg by mouth daily.    Marland Kitchen atorvastatin (LIPITOR) 40 MG tablet TAKE 1 TABLET EVERY DAY 30 tablet 0  . busPIRone (BUSPAR) 30 MG tablet Take 1 tablet (30 mg total) by mouth 2 (two) times daily. 180 tablet 3  . calcium carbonate (OS-CAL) 1250 (500 Ca) MG chewable tablet Chew 1 tablet (1,250 mg total)  by mouth daily. 100 tablet 3  . diltiazem (CARDIZEM) 30 MG tablet TAKE 1 TABLET TWICE DAILY AS NEEDED 60 tablet 0  . doxepin (SINEQUAN) 50 MG capsule Take 1 capsule (50 mg total) by mouth at bedtime for 14 days. 14 capsule 0  . DULoxetine (CYMBALTA) 60 MG capsule TAKE 2 CAPSULES (120 MG TOTAL) EVERY DAY 180 capsule 0  . EPINEPHrine 0.3 mg/0.3 mL IJ SOAJ injection Inject 0.3 mLs (0.3 mg total) into the muscle as needed for  anaphylaxis. (Patient not taking: Reported on 02/24/2021) 1 Device 2  . EQ ALLERGY RELIEF, CETIRIZINE, 10 MG tablet Take 1 tablet by mouth once daily 90 tablet 0  . ipratropium-albuterol (DUONEB) 0.5-2.5 (3) MG/3ML SOLN Take 3 mLs by nebulization every 6 (six) hours as needed. 1080 mL 2  . meclizine (ANTIVERT) 25 MG tablet TAKE 1 TABLET BY MOUTH THREE TIMES DAILY AS NEEDED FOR DIZZINESS 180 tablet 0  . meloxicam (MOBIC) 7.5 MG tablet Take 1 tablet (7.5 mg total) by mouth daily. 90 tablet 0  . Multiple Vitamin (MULTIVITAMIN WITH MINERALS) TABS tablet Take 1 tablet by mouth daily.    . Multiple Vitamins-Minerals (VITAMIN D3 COMPLETE PO) Take by mouth 2 (two) times daily.    . nicotine (NICODERM CQ - DOSED IN MG/24 HOURS) 21 mg/24hr patch Place 1 patch (21 mg total) onto the skin daily. (Patient not taking: Reported on 02/24/2021) 28 patch 0  . ramelteon (ROZEREM) 8 MG tablet Take 1 tablet (8 mg total) by mouth at bedtime. Insomnia refractory to Melatonin, Doxepin, Xanax 90 tablet 3  . Respiratory Therapy Supplies (NEBULIZER/TUBING/MOUTHPIECE) KIT 1 Units 4 (four) times daily by Does not apply route. GIVE face mask and tubing for nebulizer 1 each 11  . rOPINIRole (REQUIP) 1 MG tablet TAKE 1 TO 4 TABLETS BY MOUTH AT BEDTIME 360 tablet 0  . TRELEGY ELLIPTA 100-62.5-25 MCG/INH AEPB INHALE 1 PUFF ONCE DAILY 180 each 1  . varenicline (CHANTIX STARTING MONTH PAK) 0.5 MG X 11 & 1 MG X 42 tablet Take 1 0.5 mg tablet once daily for 3 days, increase to 1 0.5 mg tablet twice daily for 4 days, increase to 1 1 mg tablet twice daily. (Patient not taking: Reported on 02/24/2021) 53 tablet 0  . vitamin B-12 (CYANOCOBALAMIN) 100 MCG tablet Take 100 mcg by mouth daily.    . vitamin C (ASCORBIC ACID) 250 MG tablet Take by mouth daily.     No current facility-administered medications for this visit.    Medication Side Effects: None  Allergies:  Allergies  Allergen Reactions  . Doxycycline     Chest pain    Past  Medical History:  Diagnosis Date  . Anxiety   . COPD (chronic obstructive pulmonary disease) ([place])   . Osteoporosis     Past Medical History, Surgical history, Social history, and Family history were reviewed and updated as appropriate.   Please see review of systems for further details on the patient's review from today.   Objective:   Physical Exam:  There were no vitals taken for this visit.  Physical Exam Constitutional:      General: She is not in acute distress. Musculoskeletal:        General: No deformity.  Neurological:     Mental Status: She is alert and oriented to person, place, and time.     Coordination: Coordination normal.  Psychiatric:        Attention and Perception: Attention and perception normal. She does not perceive  auditory or visual hallucinations.        Mood and Affect: Mood normal. Mood is not anxious or depressed. Affect is not labile, blunt, angry or inappropriate.        Speech: Speech normal.        Behavior: Behavior normal.        Thought Content: Thought content normal. Thought content is not paranoid or delusional. Thought content does not include homicidal or suicidal ideation. Thought content does not include homicidal or suicidal plan.        Cognition and Memory: Cognition and memory normal.        Judgment: Judgment normal.     Comments: Insight intact     Lab Review:     Component Value Date/Time   NA 143 06/03/2020 1505   K 4.8 06/03/2020 1505   CL 106 06/03/2020 1505   CO2 24 06/03/2020 1505   GLUCOSE 79 06/03/2020 1505   GLUCOSE 105 (H) 02/09/2020 2135   BUN 9 06/03/2020 1505   CREATININE 0.82 06/03/2020 1505   CALCIUM 9.6 06/03/2020 1505   PROT 6.3 06/03/2020 1505   ALBUMIN 4.3 06/03/2020 1505   AST 23 06/03/2020 1505   ALT 16 06/03/2020 1505   ALKPHOS 76 06/03/2020 1505   BILITOT 0.2 06/03/2020 1505   GFRNONAA 79 06/03/2020 1505   GFRAA 91 06/03/2020 1505       Component Value Date/Time   WBC 7.4 06/03/2020  1505   WBC 8.8 02/09/2020 2135   RBC 5.03 06/03/2020 1505   RBC 4.44 02/09/2020 2135   HGB 14.9 06/03/2020 1505   HCT 44.5 06/03/2020 1505   PLT 318 06/03/2020 1505   MCV 89 06/03/2020 1505   MCH 29.6 06/03/2020 1505   MCH 30.4 02/09/2020 2135   MCHC 33.5 06/03/2020 1505   MCHC 33.5 02/09/2020 2135   RDW 12.3 06/03/2020 1505   LYMPHSABS 2.8 01/06/2020 1239   MONOABS 0.5 05/25/2018 0602   EOSABS 0.1 01/06/2020 1239   BASOSABS 0.0 01/06/2020 1239    No results found for: POCLITH, LITHIUM   No results found for: PHENYTOIN, PHENOBARB, VALPROATE, CBMZ   .res Assessment: Plan:    Plan:  PDMP reviewed  Xanax 0.66m BID  PCP prescribes - Cymbalta 1280mevery morning Buspar 3056mID Doxepin 100m58m bedtime  Refer to therapist  RTC 3 months  Patient advised to contact office with any questions, adverse effects, or acute worsening in signs and symptoms.  Discussed potential benefits, risk, and side effects of benzodiazepines to include potential risk of tolerance and dependence, as well as possible drowsiness.  Advised patient not to drive if experiencing drowsiness and to take lowest possible effective dose to minimize risk of dependence and tolerance.   Diagnoses and all orders for this visit:  Panic attacks  Generalized anxiety disorder -     ALPRAZolam (XANAX) 0.5 MG tablet; Take 0.5-1 tablets (0.25-0.5 mg total) by mouth 2 (two) times daily as needed for anxiety.     Please see After Visit Summary for patient specific instructions.  Future Appointments  Date Time Provider [place]  3:00 PM GottJanora Norlander [facility] None  10/27/2021  3:15 PM GottJanora Norlander [facility] None    No orders of the defined types were placed in this encounter.   -------------------------------

## 2021-04-05 ENCOUNTER — Other Ambulatory Visit: Payer: Self-pay | Admitting: Family Medicine

## 2021-04-05 DIAGNOSIS — M1990 Unspecified osteoarthritis, unspecified site: Secondary | ICD-10-CM

## 2021-04-05 DIAGNOSIS — G2581 Restless legs syndrome: Secondary | ICD-10-CM

## 2021-04-07 ENCOUNTER — Other Ambulatory Visit: Payer: Self-pay | Admitting: Adult Health

## 2021-04-07 DIAGNOSIS — F411 Generalized anxiety disorder: Secondary | ICD-10-CM

## 2021-04-07 NOTE — Progress Notes (Signed)

## 2021-04-11 ENCOUNTER — Other Ambulatory Visit: Payer: Self-pay | Admitting: Cardiology

## 2021-04-13 ENCOUNTER — Encounter: Payer: Self-pay | Admitting: *Deleted

## 2021-04-13 DIAGNOSIS — J449 Chronic obstructive pulmonary disease, unspecified: Secondary | ICD-10-CM | POA: Diagnosis not present

## 2021-04-13 DIAGNOSIS — F17210 Nicotine dependence, cigarettes, uncomplicated: Secondary | ICD-10-CM

## 2021-04-25 ENCOUNTER — Encounter: Payer: Self-pay | Admitting: *Deleted

## 2021-04-27 DIAGNOSIS — M5412 Radiculopathy, cervical region: Secondary | ICD-10-CM | POA: Diagnosis not present

## 2021-05-14 DIAGNOSIS — J449 Chronic obstructive pulmonary disease, unspecified: Secondary | ICD-10-CM | POA: Diagnosis not present

## 2021-05-19 ENCOUNTER — Encounter: Payer: Self-pay | Admitting: Family Medicine

## 2021-05-19 ENCOUNTER — Ambulatory Visit (INDEPENDENT_AMBULATORY_CARE_PROVIDER_SITE_OTHER): Payer: Medicare HMO | Admitting: Family Medicine

## 2021-05-19 ENCOUNTER — Other Ambulatory Visit: Payer: Self-pay

## 2021-05-19 VITALS — BP 132/82 | HR 100 | Temp 97.6°F | Ht 64.5 in | Wt 173.4 lb

## 2021-05-19 DIAGNOSIS — F5104 Psychophysiologic insomnia: Secondary | ICD-10-CM | POA: Diagnosis not present

## 2021-05-19 DIAGNOSIS — J410 Simple chronic bronchitis: Secondary | ICD-10-CM | POA: Diagnosis not present

## 2021-05-19 MED ORDER — BREZTRI AEROSPHERE 160-9-4.8 MCG/ACT IN AERO: 2.0000 | INHALATION_SPRAY | Freq: Two times a day (BID) | RESPIRATORY_TRACT | 11 refills | Status: DC

## 2021-05-19 NOTE — Patient Instructions (Signed)
Use Breztri INSTEAD of Trelegy.  Alicia Sanchez will help you get this for free.

## 2021-05-19 NOTE — Progress Notes (Signed)
Subjective: CC: insomnia PCP: Janora Norlander, DO UTM:LYYTKP Alicia Sanchez is a 60 y.o. female presenting to clinic today for:  1. Insomnia Patient continues to suffer from insomnia despite treatment with RozarEM.  She has not noticed a huge difference at all.  She has follow-up with her psychiatrist in August.  She was continued on current medications.  No reports of excessive daytime sedation  2.  COPD Patient reports that she has not used her inhaler today.  She is worried about running out of the Trelegy soon.  Unfortunately when she went to the pharmacy it was over $500 to get the medication.  She is apparently in the donut hole now.  She continues to smoke.  She has not yet used the Chantix but does have the prescription at home.  She reports increased stress due to her husband's declining health.  He is currently chronically on 5 L of oxygen via nasal cannula and still has issues with desaturations.   ROS: Per HPI  Allergies  Allergen Reactions   Doxycycline     Chest pain   Past Medical History:  Diagnosis Date   Anxiety    COPD (chronic obstructive pulmonary disease) ([facility])    Osteoporosis     Current Outpatient Medications:    albuterol (VENTOLIN HFA) 108 (90 Base) MCG/ACT inhaler, Inhale 2 puffs into the lungs every 6 (six) hours as needed for wheezing or shortness of breath., Disp: 3 each, Rfl: 2   alendronate (FOSAMAX) 70 MG tablet, TAKE 1 TABLET EVERY 7 DAYS WITH A FULL GLASS OF WATER ON AN EMPTY STOMACH, Disp: 12 tablet, Rfl: 0   ALPRAZolam (XANAX) 0.5 MG tablet, Take 0.5-1 tablets (0.25-0.5 mg total) by mouth 2 (two) times daily as needed for anxiety., Disp: 60 tablet, Rfl: 2   aspirin EC 81 MG tablet, Take 81 mg by mouth daily., Disp: , Rfl:    atorvastatin (LIPITOR) 40 MG tablet, TAKE 1 TABLET EVERY DAY (NEED MD APPOINTMENT FOR REFILLS), Disp: 14 tablet, Rfl: 0   busPIRone (BUSPAR) 30 MG tablet, Take 1 tablet (30 mg total) by mouth 2 (two) times daily., Disp: 180  tablet, Rfl: 3   calcium carbonate (OS-CAL) 1250 (500 Ca) MG chewable tablet, Chew 1 tablet (1,250 mg total) by mouth daily., Disp: 100 tablet, Rfl: 3   diltiazem (CARDIZEM) 30 MG tablet, TAKE 1 TABLET TWICE DAILY AS NEEDED, Disp: 60 tablet, Rfl: 0   doxepin (SINEQUAN) 50 MG capsule, Take 1 capsule (50 mg total) by mouth at bedtime for 14 days., Disp: 14 capsule, Rfl: 0   DULoxetine (CYMBALTA) 60 MG capsule, TAKE 2 CAPSULES (120 MG TOTAL) EVERY DAY, Disp: 180 capsule, Rfl: 0   EPINEPHrine 0.3 mg/0.3 mL IJ SOAJ injection, Inject 0.3 mLs (0.3 mg total) into the muscle as needed for anaphylaxis. (Patient not taking: Reported on 02/24/2021), Disp: 1 Device, Rfl: 2   EQ ALLERGY RELIEF, CETIRIZINE, 10 MG tablet, Take 1 tablet by mouth once daily, Disp: 90 tablet, Rfl: 0   ipratropium-albuterol (DUONEB) 0.5-2.5 (3) MG/3ML SOLN, Take 3 mLs by nebulization every 6 (six) hours as needed., Disp: 1080 mL, Rfl: 2   meclizine (ANTIVERT) 25 MG tablet, TAKE 1 TABLET BY MOUTH THREE TIMES DAILY AS NEEDED FOR DIZZINESS, Disp: 180 tablet, Rfl: 0   meloxicam (MOBIC) 7.5 MG tablet, TAKE 1 TABLET EVERY DAY, Disp: 90 tablet, Rfl: 0   Multiple Vitamin (MULTIVITAMIN WITH MINERALS) TABS tablet, Take 1 tablet by mouth daily., Disp: , Rfl:  Multiple Vitamins-Minerals (VITAMIN D3 COMPLETE PO), Take by mouth 2 (two) times daily., Disp: , Rfl:    nicotine (NICODERM CQ - DOSED IN MG/24 HOURS) 21 mg/24hr patch, Place 1 patch (21 mg total) onto the skin daily. (Patient not taking: Reported on 02/24/2021), Disp: 28 patch, Rfl: 0   ramelteon (ROZEREM) 8 MG tablet, Take 1 tablet (8 mg total) by mouth at bedtime. Insomnia refractory to Melatonin, Doxepin, Xanax, Disp: 90 tablet, Rfl: 3   Respiratory Therapy Supplies (NEBULIZER/TUBING/MOUTHPIECE) KIT, 1 Units 4 (four) times daily by Does not apply route. GIVE face mask and tubing for nebulizer, Disp: 1 each, Rfl: 11   rOPINIRole (REQUIP) 1 MG tablet, TAKE 1 TO 4 TABLETS AT BEDTIME, Disp: 360  tablet, Rfl: 0   TRELEGY ELLIPTA 100-62.5-25 MCG/INH AEPB, INHALE 1 PUFF ONCE DAILY, Disp: 180 each, Rfl: 1   varenicline (CHANTIX STARTING MONTH PAK) 0.5 MG X 11 & 1 MG X 42 tablet, Take 1 0.5 mg tablet once daily for 3 days, increase to 1 0.5 mg tablet twice daily for 4 days, increase to 1 1 mg tablet twice daily. (Patient not taking: Reported on 02/24/2021), Disp: 53 tablet, Rfl: 0   vitamin B-12 (CYANOCOBALAMIN) 100 MCG tablet, Take 100 mcg by mouth daily., Disp: , Rfl:    vitamin C (ASCORBIC ACID) 250 MG tablet, Take by mouth daily., Disp: , Rfl:  Social History   Socioeconomic History   Marital status: Married    Spouse name: Not on file   Number of children: Not on file   Years of education: Not on file   Highest education level: Not on file  Occupational History   Not on file  Tobacco Use   Smoking status: Former    Packs/day: 0.50    Years: 42.00    Pack years: 21.00    Types: Cigarettes   Smokeless tobacco: Never  Vaping Use   Vaping Use: Never used  Substance and Sexual Activity   Alcohol use: No   Drug use: No   Sexual activity: Yes    Birth control/protection: Surgical  Other Topics Concern   Not on file  Social History Narrative   Not on file   Social Determinants of Health   Financial Resource Strain: Not on file  Food Insecurity: Not on file  Transportation Needs: Not on file  Physical Activity: Not on file  Stress: Not on file  Social Connections: Not on file  Intimate Partner Violence: Not on file   Family History  Problem Relation Age of Onset   COPD Mother    COPD Father    Diabetes type II Sister    Diabetes type II Brother    COPD Maternal Grandfather     Objective: Office vital signs reviewed. BP 132/82   Pulse 100   Temp 97.6 F (36.4 C)   Ht 5' 4.5" (1.638 m)   Wt 173 lb 6.4 oz (78.7 kg)   SpO2 96%   BMI 29.30 kg/m   Physical Examination:  General: Awake, alert, No acute distress HEENT: Normal, sclera white, MMM Cardio:  regular rate and rhythm, S1S2 heard, no murmurs appreciated Pulm: Expiratory wheezes noted along the apexes of the lungs.  Otherwise clear to auscultation bilaterally with normal work of breathing on room air Psych: Mood stable.  Patient is pleasant and interactive  Assessment/ Plan: 60 y.o. female   Psychophysiological insomnia  Simple chronic bronchitis ([place]) - Plan: Budeson-Glycopyrrol-Formoterol (BREZTRI AEROSPHERE) 160-9-4.8 MCG/ACT AERO, AMB Referral to [facility]  Coordinaton  Insomnia is really unchanged with RozaREM.  She will follow-up with her psychiatrist for ongoing assistance with this as she has been having issues with sleep despite multiple attempts at different medications.  Currently treated with chronic benzodiazepine so would worry about using something like Lunesta or Ambien in this patient.  Will trial Breztri.  She will see Almyra Free for medication assistance with this as her current inhaler has put her in the donut hole and is not affordable.  Sample has been provided today  No orders of the defined types were placed in this encounter.  No orders of the defined types were placed in this encounter.    Janora Norlander, DO [place] [phone]

## 2021-05-23 ENCOUNTER — Telehealth: Payer: Self-pay | Admitting: Family Medicine

## 2021-05-23 ENCOUNTER — Telehealth: Payer: Self-pay

## 2021-05-23 NOTE — Chronic Care Management (AMB) (Signed)
  Chronic Care Management   Note  05/23/2021 Name: Alicia Sanchez MRN: [id number] DOB: Nov 18, 1961  [name] is a 60 y.o. year old female who is a primary care patient of Janora Norlander, DO. I reached out to [name] by phone today in response to a referral sent by Ms. Goldie Haraway's PCP, Janora Norlander, DO      Ms. Kunz was given information about Chronic Care Management services today including:  CCM service includes personalized support from designated clinical staff supervised by her physician, including individualized plan of care and coordination with other care providers 24/7 contact phone numbers for assistance for urgent and routine care needs. Service will only be billed when office clinical staff spend 20 minutes or more in a month to coordinate care. Only one practitioner may furnish and bill the service in a calendar month. The patient may stop CCM services at any time (effective at the end of the month) by phone call to the office staff. The patient will be responsible for cost sharing (co-pay) of up to 20% of the service fee (after annual deductible is met).  Patient agreed to services and verbal consent obtained.   Follow up plan: Telephone appointment with care management team member scheduled for:06/08/2021  Noreene Larsson, [place], [place], [place] [address] Direct Dial: [phone] [email or web address]

## 2021-05-23 NOTE — Telephone Encounter (Signed)
  Prescription Request  05/23/2021  What is the name of the medication or equipment? Pt needs sample of inhaler vreztir  Have you contacted your pharmacy to request a refill? (if applicable) na  Which pharmacy would you like this sent to? na   Patient notified that their request is being sent to the clinical staff for review and that they should receive a response within 2 business days.

## 2021-05-26 ENCOUNTER — Ambulatory Visit: Payer: Medicare HMO | Admitting: Family Medicine

## 2021-06-08 ENCOUNTER — Ambulatory Visit (INDEPENDENT_AMBULATORY_CARE_PROVIDER_SITE_OTHER): Payer: Medicare HMO | Admitting: Pharmacist

## 2021-06-08 DIAGNOSIS — J42 Unspecified chronic bronchitis: Secondary | ICD-10-CM

## 2021-06-08 DIAGNOSIS — J441 Chronic obstructive pulmonary disease with (acute) exacerbation: Secondary | ICD-10-CM

## 2021-06-08 NOTE — Progress Notes (Signed)
Chronic Care Management Pharmacy Note  06/08/2021 Name:  Alicia Sanchez MRN:  [id number] DOB:  07-Aug-1961  Summary: COPD/smoking cessation  Recommendations/Changes made from today's visit:  Chronic Obstructive Pulmonary Disease: Uncontrolled; current treatment: Breztri (new transition from Trelegy) Continues on [place], nebs as needed, continue to use rescue inhaler at least twice a day Reviewed new Breztri dosing--> 2 puffs twice daily (was previously only doing 1 puff once daily) We have switched to breztri due to ease of patient assistance She quit smoking in 2021 but unfortunately started again due to family stresses GOLD Classification: II Smoking history: 35-pack-year smoking history.  Active smoker Discussed smoking cessation.  She continues to try decreasing the amount of cigarettes She is not taking Chantix, she is not wearing patches She remains willing to try to quit Continue to counsel Educated on proper use of Breztri since recent transition from Trelegy to [place] patient finances. Patient to qualify for the [place] patient assistance; application filled out and faxed   Patient Goals/Self-Care Activities Over the next 90 days, patient will:  - take medications as prescribed Work to decrease the amount of cigarretes per day; decrease amount of rescue inhaler as able  Follow Up Plan: Telephone follow up appointment with care management team member scheduled for: 2 weeks  Subjective: [name] is an 60 y.o. year old female who is a primary patient of Alicia Norlander, DO.  The CCM team was consulted for assistance with disease management and care coordination needs.    Engaged with patient by telephone for initial visit in response to provider referral for pharmacy case management and/or care coordination services.   Consent to Services:  The patient was given information about Chronic Care Management services, agreed to services, and gave verbal  consent prior to initiation of services.  Please see initial visit note for detailed documentation.   Patient Care Team: Alicia Norlander, DO as PCP - General (Family Medicine) Arnoldo Lenis, MD as PCP - Cardiology (Cardiology) Marshell Garfinkel, MD as Consulting Physician (Pulmonary Disease) Lavera Guise, [facility] (Pharmacist)  Recent office visits: 05/19/21  Recent consult visits: [facility] 01/2021  Hospital visits: None in previous 6 months  Objective:  Lab Results  Component Value Date   CREATININE 0.82 06/03/2020   CREATININE 0.92 02/09/2020   CREATININE 0.82 01/06/2020    No results found for: HGBA1C Last diabetic Eye exam: No results found for: HMDIABEYEEXA  Last diabetic Foot exam: No results found for: HMDIABFOOTEX      Component Value Date/Time   CHOL 199 01/06/2020 1239   TRIG 162 (H) 01/06/2020 1239   HDL 63 01/06/2020 1239   CHOLHDL 3.2 01/06/2020 1239   LDLCALC 108 (H) 01/06/2020 1239    Hepatic Function Latest Ref Rng & Units 06/03/2020 01/06/2020 06/02/2018  Total Protein 6.0 - 8.5 g/dL 6.3 7.0 5.9(L)  Albumin 3.8 - 4.9 g/dL 4.3 4.6 3.9  AST 0 - 40 IU/L _0 ALT 0 - 32 IU/L 16 14 32  Alk Phosphatase 48 - 121 IU/L 76 80 79  Total Bilirubin 0.0 - 1.2 mg/dL 0.2 0.3 0.3    Lab Results  Component Value Date/Time   TSH 1.020 06/03/2020 03:05 PM   TSH 1.530 01/06/2020 12:39 PM    CBC Latest Ref Rng & Units 06/03/2020 02/09/2020 01/06/2020  WBC 3.4 - 10.8 x10E3/uL 7.4 8.8 8.7  Hemoglobin 11.1 - 15.9 g/dL 14.9 13.5 16.3(H)  Hematocrit 34.0 - 46.6 % 44.5 40.3 47.7(H)  Platelets 150 - 450 x10E3/uL 318 308 425    No results found for: VD25OH  Clinical ASCVD: No  The 10-year ASCVD risk score Alicia Bussing DC Jr., et al., 2013) is: 6.7%   Values used to calculate the score:     Age: 20 years     Sex: Female     Is Non-Hispanic African American: No     Diabetic: No     Tobacco smoker: Yes     Systolic Blood Pressure: 628 mmHg     Is BP treated: No     HDL  Cholesterol: 63 mg/dL     Total Cholesterol: 199 mg/dL    Other: (CHADS2VASc if Afib, PHQ9 if depression, MMRC or CAT for COPD, ACT, DEXA)  Social History   Tobacco Use  Smoking Status Former   Packs/day: 0.50   Years: 42.00   Pack years: 21.00   Types: Cigarettes  Smokeless Tobacco Never   BP Readings from Last 3 Encounters:  05/19/21 132/82  02/24/21 105/68  01/27/21 116/76   Pulse Readings from Last 3 Encounters:  05/19/21 100  02/24/21 97  01/27/21 (!) 108   Wt Readings from Last 3 Encounters:  05/19/21 173 lb 6.4 oz (78.7 kg)  02/24/21 178 lb 9.6 oz (81 kg)  01/27/21 174 lb 3.2 oz (79 kg)    Assessment: Review of patient past medical history, allergies, medications, health status, including review of consultants reports, laboratory and other test data, was performed as part of comprehensive evaluation and provision of chronic care management services.   SDOH:  (Social Determinants of Health) assessments and interventions performed:    CCM Care Plan  Allergies  Allergen Reactions   Doxycycline     Chest pain    Medications Reviewed Today     Reviewed by Lavera Guise, [facility] (Pharmacist) on 06/08/21 at [facility] List Status: <None>   Medication Order Taking? Sig Documenting Provider Last Dose Status Informant  albuterol (VENTOLIN HFA) 108 (90 Base) MCG/ACT inhaler [id number] No Inhale 2 puffs into the lungs every 6 (six) hours as needed for wheezing or shortness of breath. Marshell Garfinkel, MD Taking Active   alendronate (FOSAMAX) 70 MG tablet [id number]  TAKE 1 TABLET EVERY 7 DAYS WITH A FULL GLASS OF WATER ON AN EMPTY STOMACH Ronnie Doss M, DO  Active   ALPRAZolam (XANAX) 0.5 MG tablet [id number]  Take 0.5-1 tablets (0.25-0.5 mg total) by mouth 2 (two) times daily as needed for anxiety. Mozingo, Berdie Ogren, NP  Active   aspirin EC 81 MG tablet [id number] No Take 81 mg by mouth daily. [provider] Taking Active   atorvastatin (LIPITOR) 40 MG  tablet [id number]  TAKE 1 TABLET EVERY DAY (NEED MD APPOINTMENT FOR REFILLS) Arnoldo Lenis, MD  Active   Budeson-Glycopyrrol-Formoterol (BREZTRI AEROSPHERE) 160-9-4.8 MCG/ACT Hollie Salk [id number]  Inhale 2 puffs into the lungs 2 (two) times daily. Ronnie Doss M, DO  Active   busPIRone (BUSPAR) 30 MG tablet [id number] No Take 1 tablet (30 mg total) by mouth 2 (two) times daily. Ronnie Doss M, DO Taking Active   calcium carbonate (OS-CAL) 1250 (500 Ca) MG chewable tablet [id number]  Chew 1 tablet (1,250 mg total) by mouth daily. Ronnie Doss M, DO  Active   diltiazem (CARDIZEM) 30 MG tablet [id number]  TAKE 1 TABLET TWICE DAILY AS NEEDED Branch, Alphonse Guild, MD  Active   doxepin (SINEQUAN) 50 MG capsule [id number]  Take 1 capsule (50 mg total) by mouth at bedtime for  14 days. Ronnie Doss M, DO  Active   DULoxetine (CYMBALTA) 60 MG capsule [id number]  TAKE 2 CAPSULES (120 MG TOTAL) EVERY DAY Ronnie Doss M, DO  Active   EPINEPHrine 0.3 mg/0.3 mL IJ SOAJ injection [id number] No Inject 0.3 mLs (0.3 mg total) into the muscle as needed for anaphylaxis.  Patient not taking: Reported on 02/24/2021   Terald Sleeper, PA-C Not Taking Active   EQ ALLERGY RELIEF, CETIRIZINE, 10 MG tablet [id number] No Take 1 tablet by mouth once daily Gottschalk, Ashly M, DO Taking Active   ipratropium-albuterol (DUONEB) 0.5-2.5 (3) MG/3ML SOLN [id number] No Take 3 mLs by nebulization every 6 (six) hours as needed. Marshell Garfinkel, MD Taking Active   meclizine (ANTIVERT) 25 MG tablet [id number] No TAKE 1 TABLET BY MOUTH THREE TIMES DAILY AS NEEDED FOR DIZZINESS Ronnie Doss M, DO Taking Active   meloxicam (MOBIC) 7.5 MG tablet [id number]  TAKE 1 TABLET EVERY DAY Ronnie Doss M, DO  Active   Multiple Vitamin (MULTIVITAMIN WITH MINERALS) TABS tablet [id number] No Take 1 tablet by mouth daily. [provider] Taking Active Self  Multiple Vitamins-Minerals (VITAMIN D3 COMPLETE PO) [id number] No Take by  mouth 2 (two) times daily. [provider] Taking Active   nicotine (NICODERM CQ - DOSED IN MG/24 HOURS) 21 mg/24hr patch [id number] No Place 1 patch (21 mg total) onto the skin daily.  Patient not taking: Reported on 02/24/2021   Marshell Garfinkel, MD Not Taking Active   ramelteon (ROZEREM) 8 MG tablet [id number]  Take 1 tablet (8 mg total) by mouth at bedtime. Insomnia refractory to Melatonin, Doxepin, Xanax Alicia Norlander, DO  Active   Respiratory Therapy Supplies (NEBULIZER/TUBING/MOUTHPIECE) KIT [id number] No 1 Units 4 (four) times daily by Does not apply route. GIVE face mask and tubing for nebulizer Terald Sleeper, PA-C Taking Active Self  rOPINIRole (REQUIP) 1 MG tablet [id number]  TAKE 1 TO 4 TABLETS AT BEDTIME Gottschalk, Ashly M, DO  Active   varenicline (CHANTIX STARTING MONTH PAK) 0.5 MG X 11 & 1 MG X 42 tablet [id number] No Take 1 0.5 mg tablet once daily for 3 days, increase to 1 0.5 mg tablet twice daily for 4 days, increase to 1 1 mg tablet twice daily.  Patient not taking: Reported on 02/24/2021   Marshell Garfinkel, MD Not Taking Active   vitamin B-12 (CYANOCOBALAMIN) 100 MCG tablet [id number] No Take 100 mcg by mouth daily. [provider] Taking Active   vitamin C (ASCORBIC ACID) 250 MG tablet [id number] No Take by mouth daily. 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   Other spondylosis with radiculopathy, cervical region 12/21/2018   Chronic left shoulder pain 11/24/2018   Arthritis 11/24/2018   Mixed stress and urge urinary incontinence 08/12/2018   Actinic keratosis 07/17/2018   Chronic respiratory failure with hypoxia ([employer]) 07/16/2018   Abnormal findings on diagnostic imaging of lung 06/18/2018   Lung nodules 08/21/2017   GOLD COPD II D 08/21/2017   Recurrent UTI  08/21/2017   Generalized anxiety disorder 03/22/2017   Moderate episode of recurrent major depressive disorder ([place]) 12/16/2016   Restless legs 12/16/2016   OAB (overactive bladder) 12/16/2016   Leukocytosis 11/19/2015   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   Tobacco abuse 11/19/2015  Immunization History  Administered Date(s) Administered   Influenza Split 08/25/2016   Influenza,inj,Quad PF,6+ Mos 09/25/2017, 07/16/2018, 10/25/2020   Influenza-Unspecified 07/16/2018   Moderna Sars-Covid-2 Vaccination 01/19/2020, 02/16/2020   Pneumococcal Polysaccharide-23 05/23/2018   Tdap 05/19/2007, 07/08/2014    Conditions to be addressed/monitored: COPD  Care Plan : PHARMD MEDICATION MANAGEMENT  Updates made by Lavera Guise, [place] since 06/08/2021 12:00 AM     Problem: DISEASE PROGRESION PREVENTION      Long-Range Goal: COPD   This Visit's Progress: Not on track  Priority: High  Note:   Current Barriers:  Unable to independently afford treatment regimen Unable to achieve control of COPD   Pharmacist Clinical Goal(s):  Over the next 90 days, patient will verbalize ability to afford treatment regimen achieve control of COPD as evidenced by [place], IMPROVED QUALITY OF LIFE  through collaboration with PharmD and provider.    Interventions: 1:1 collaboration with Alicia Norlander, DO regarding development and update of comprehensive plan of care as evidenced by provider attestation and co-signature Inter-disciplinary care team collaboration (see longitudinal plan of care) Comprehensive medication review performed; medication list updated in electronic medical record  Chronic Obstructive Pulmonary Disease: Uncontrolled; current treatment: Breztri (new transition from Trelegy) Continues on [place], nebs as needed, continue to use rescue inhaler at least twice a day Reviewed new Breztri dosing--> 2 puffs twice daily (was previously only doing 1 puff once daily) We have  switched to breztri due to ease of patient assistance She quit smoking in 2021 but unfortunately started again due to family stresses GOLD Classification: II Smoking history: 35-pack-year smoking history.  Active smoker Discussed smoking cessation.  She continues to try decreasing the amount of cigarettes She is not taking Chantix, she is not wearing patches She remains willing to try to quit Continue to counsel Most recent Pulmonary Function Testing:  Pulmonary Functions Testing Results: 2019  TLC  Date Value Ref Range Status  08/18/2018 5.98 L Final   Educated on proper use of Breztri since recent transition from Trelegy to [place] patient finances. Patient to qualify for the AZ&me patient assistance; application filled out and faxed   Patient Goals/Self-Care Activities Over the next 90 days, patient will:  - take medications as prescribed Work to decrease the amount of cigarretes per day; decrease amount of rescue inhaler as able  Follow Up Plan: Telephone follow up appointment with care management team member scheduled for: 2 weeks      Medication Assistance: Application for breztri/az&me  medication assistance program. in process.  Anticipated assistance start date TBD.  See plan of care for additional detail.  Patient's preferred pharmacy is:  [facility] [address], [place] [address] Phone: [phone] Fax: [facility] - [place], [place] [address] Phone: [phone] Fax: [phone]   Follow Up:  Patient agrees to Care Plan and Follow-up.  Plan: Telephone follow up appointment with care management team member scheduled for:  2 weeks  Regina Eck, PharmD, BCPS Clinical Pharmacist, [place]  II Phone [phone]

## 2021-06-08 NOTE — Patient Instructions (Signed)
Visit Information  PATIENT GOALS:  Goals Addressed               This Visit's Progress     Patient Stated     COPD (pt-stated)        Current Barriers:  Unable to independently afford treatment regimen Unable to achieve control of COPD   Pharmacist Clinical Goal(s):  Over the next 90 days, patient will verbalize ability to afford treatment regimen achieve control of COPD as evidenced by DECREASED USE OF RESCUE INHALER, IMPROVED QUALITY OF LIFE through collaboration with PharmD and provider.    Interventions: 1:1 collaboration with [name], DO regarding development and update of comprehensive plan of care as evidenced by provider attestation and co-signature Inter-disciplinary care team collaboration (see longitudinal plan of care) Comprehensive medication review performed; medication list updated in electronic medical record  Chronic Obstructive Pulmonary Disease: Uncontrolled; current treatment: Breztri (new transition from Trelegy) Continues on [place], nebs as needed, continue to use rescue inhaler at least twice a day Reviewed new Breztri dosing--> 2 puffs twice daily (was previously only doing 1 puff once daily) We have switched to breztri due to ease of patient assistance She quit smoking in 2021 but unfortunately started again due to family stresses GOLD Classification: II Smoking history: 35-pack-year smoking history.  Active smoker Discussed smoking cessation.  She continues to try decreasing the amount of cigarettes She is not taking Chantix, she is not wearing patches She remains willing to try to quit Continue to counsel Most recent Pulmonary Function Testing:  Pulmonary Functions Testing Results: 2019  TLC  Date Value Ref Range Status  08/18/2018 5.98 L Final   Educated on proper use of Breztri since recent transition from Trelegy to [place] Assessed patient finances. Patient to qualify for the AZ&me patient assistance; application filled out  and faxed   Patient Goals/Self-Care Activities Over the next 90 days, patient will:  - take medications as prescribed Work to decrease the amount of cigarretes per day; decrease amount of rescue inhaler as able  Follow Up Plan: Telephone follow up appointment with care management team member scheduled for: 2 weeks         The patient verbalized understanding of instructions, educational materials, and care plan provided today and declined offer to receive copy of patient instructions, educational materials, and care plan.   Telephone follow up appointment with care management team member scheduled for: 2 weeks   Signature Kieth Brightly, PharmD, BCPS Clinical Pharmacist, [facility]  II Phone [phone]

## 2021-06-13 DIAGNOSIS — J449 Chronic obstructive pulmonary disease, unspecified: Secondary | ICD-10-CM | POA: Diagnosis not present

## 2021-06-14 ENCOUNTER — Ambulatory Visit (INDEPENDENT_AMBULATORY_CARE_PROVIDER_SITE_OTHER): Payer: Medicare HMO | Admitting: Family Medicine

## 2021-06-14 DIAGNOSIS — M79604 Pain in right leg: Secondary | ICD-10-CM

## 2021-06-14 DIAGNOSIS — M79605 Pain in left leg: Secondary | ICD-10-CM | POA: Diagnosis not present

## 2021-06-14 DIAGNOSIS — M5136 Other intervertebral disc degeneration, lumbar region: Secondary | ICD-10-CM | POA: Diagnosis not present

## 2021-06-14 MED ORDER — GABAPENTIN 300 MG PO CAPS: 300.0000 mg | ORAL_CAPSULE | Freq: Every day | ORAL | 0 refills | Status: DC

## 2021-06-14 MED ORDER — PREDNISONE 10 MG (21) PO TBPK: ORAL_TABLET | ORAL | 0 refills | Status: DC

## 2021-06-14 NOTE — Progress Notes (Signed)
Telephone visit  Subjective: CC:leg pain PCP: Janora Norlander, DO Alicia Sanchez is a 60 y.o. female calls for telephone consult today. Patient provides verbal consent for consult held via phone.  Due to COVID-19 pandemic this visit was conducted virtually. This visit type was conducted due to national recommendations for restrictions regarding the COVID-19 Pandemic (e.g. social distancing, sheltering in place) in an effort to limit this patient's exposure and mitigate transmission in our community. All issues noted in this document were discussed and addressed.  A physical exam was not performed with this format.   Location of patient: home Location of provider: [facility] Others present for call: none  1. Leg pain Leg pain has worsened over the last 3 nights. She unable to describe the pain (throbbing?) but notes that affects her feet and legs (left worse than right).  No changes in position changed pain in her feet.  She has been using meloxicam (7.61m) and requip (360m and this has not helped.  She has been using motrin as well. She reports some intermittent numbness and weakness in her legs.  ROS: Per HPI  Allergies  Allergen Reactions   Doxycycline     Chest pain   Past Medical History:  Diagnosis Date   Anxiety    COPD (chronic obstructive pulmonary disease) (HCC)    Osteoporosis     Current Outpatient Medications:    albuterol (VENTOLIN HFA) 108 (90 Base) MCG/ACT inhaler, Inhale 2 puffs into the lungs every 6 (six) hours as needed for wheezing or shortness of breath., Disp: 3 each, Rfl: 2   alendronate (FOSAMAX) 70 MG tablet, TAKE 1 TABLET EVERY 7 DAYS WITH A FULL GLASS OF WATER ON AN EMPTY STOMACH, Disp: 12 tablet, Rfl: 0   ALPRAZolam (XANAX) 0.5 MG tablet, Take 0.5-1 tablets (0.25-0.5 mg total) by mouth 2 (two) times daily as needed for anxiety., Disp: 60 tablet, Rfl: 2   aspirin EC 81 MG tablet, Take 81 mg by mouth daily., Disp: , Rfl:    atorvastatin (LIPITOR) 40 MG  tablet, TAKE 1 TABLET EVERY DAY (NEED MD APPOINTMENT FOR REFILLS), Disp: 14 tablet, Rfl: 0   Budeson-Glycopyrrol-Formoterol (BREZTRI AEROSPHERE) 160-9-4.8 MCG/ACT AERO, Inhale 2 puffs into the lungs 2 (two) times daily., Disp: 10.7 g, Rfl: 11   busPIRone (BUSPAR) 30 MG tablet, Take 1 tablet (30 mg total) by mouth 2 (two) times daily., Disp: 180 tablet, Rfl: 3   calcium carbonate (OS-CAL) 1250 (500 Ca) MG chewable tablet, Chew 1 tablet (1,250 mg total) by mouth daily., Disp: 100 tablet, Rfl: 3   diltiazem (CARDIZEM) 30 MG tablet, TAKE 1 TABLET TWICE DAILY AS NEEDED, Disp: 60 tablet, Rfl: 0   DULoxetine (CYMBALTA) 60 MG capsule, TAKE 2 CAPSULES (120 MG TOTAL) EVERY DAY, Disp: 180 capsule, Rfl: 0   EQ ALLERGY RELIEF, CETIRIZINE, 10 MG tablet, Take 1 tablet by mouth once daily, Disp: 90 tablet, Rfl: 0   ipratropium-albuterol (DUONEB) 0.5-2.5 (3) MG/3ML SOLN, Take 3 mLs by nebulization every 6 (six) hours as needed., Disp: 1080 mL, Rfl: 2   meclizine (ANTIVERT) 25 MG tablet, TAKE 1 TABLET BY MOUTH THREE TIMES DAILY AS NEEDED FOR DIZZINESS, Disp: 180 tablet, Rfl: 0   meloxicam (MOBIC) 7.5 MG tablet, TAKE 1 TABLET EVERY DAY, Disp: 90 tablet, Rfl: 0   Multiple Vitamin (MULTIVITAMIN WITH MINERALS) TABS tablet, Take 1 tablet by mouth daily., Disp: , Rfl:    Multiple Vitamins-Minerals (VITAMIN D3 COMPLETE PO), Take by mouth 2 (two) times daily., Disp: , Rfl:  ramelteon (ROZEREM) 8 MG tablet, Take 1 tablet (8 mg total) by mouth at bedtime. Insomnia refractory to Melatonin, Doxepin, Xanax, Disp: 90 tablet, Rfl: 3   Respiratory Therapy Supplies (NEBULIZER/TUBING/MOUTHPIECE) KIT, 1 Units 4 (four) times daily by Does not apply route. GIVE face mask and tubing for nebulizer, Disp: 1 each, Rfl: 11   rOPINIRole (REQUIP) 1 MG tablet, TAKE 1 TO 4 TABLETS AT BEDTIME, Disp: 360 tablet, Rfl: 0   vitamin B-12 (CYANOCOBALAMIN) 100 MCG tablet, Take 100 mcg by mouth daily., Disp: , Rfl:    vitamin C (ASCORBIC ACID) 250 MG  tablet, Take by mouth daily., Disp: , Rfl:   Assessment/ Plan: 60 y.o. female   Bilateral leg pain - Plan: predniSONE (STERAPRED UNI-PAK 21 TAB) 10 MG (21) TBPK tablet, gabapentin (NEURONTIN) 300 MG capsule, Ambulatory referral to Neurosurgery  DDD (degenerative disc disease), lumbar - Plan: predniSONE (STERAPRED UNI-PAK 21 TAB) 10 MG (21) TBPK tablet, gabapentin (NEURONTIN) 300 MG capsule, Ambulatory referral to Neurosurgery  I suspect that she has some degenerative changes going in the back of its causing radicular symptoms down her legs.  Given some intermittent weakness and numbness I do think that she should see her neurosurgeon sooner than later.  I have placed a referral for her low back as she is only been referred for her C-spine in the past.  Prednisone Dosepak and gabapentin prescribed.  Okay to use Tylenol but would avoid redundance with NSAIDs.  Start time: 1:48pm End time: 2:00pm  Total time spent on patient care (including telephone call/ virtual visit): 12 minutes  [place], [place] [phone]

## 2021-06-15 DIAGNOSIS — M5417 Radiculopathy, lumbosacral region: Secondary | ICD-10-CM | POA: Diagnosis not present

## 2021-06-15 DIAGNOSIS — Z6829 Body mass index (BMI) 29.0-29.9, adult: Secondary | ICD-10-CM | POA: Diagnosis not present

## 2021-06-15 DIAGNOSIS — R030 Elevated blood-pressure reading, without diagnosis of hypertension: Secondary | ICD-10-CM | POA: Diagnosis not present

## 2021-06-15 DIAGNOSIS — M5412 Radiculopathy, cervical region: Secondary | ICD-10-CM | POA: Diagnosis not present

## 2021-06-15 DIAGNOSIS — M47812 Spondylosis without myelopathy or radiculopathy, cervical region: Secondary | ICD-10-CM | POA: Diagnosis not present

## 2021-06-21 ENCOUNTER — Ambulatory Visit (INDEPENDENT_AMBULATORY_CARE_PROVIDER_SITE_OTHER): Payer: Medicare HMO | Admitting: Pharmacist

## 2021-06-21 DIAGNOSIS — M5417 Radiculopathy, lumbosacral region: Secondary | ICD-10-CM | POA: Diagnosis not present

## 2021-06-21 DIAGNOSIS — M545 Low back pain, unspecified: Secondary | ICD-10-CM | POA: Diagnosis not present

## 2021-06-21 DIAGNOSIS — J42 Unspecified chronic bronchitis: Secondary | ICD-10-CM

## 2021-06-21 DIAGNOSIS — J441 Chronic obstructive pulmonary disease with (acute) exacerbation: Secondary | ICD-10-CM

## 2021-06-21 DIAGNOSIS — Z7689 Persons encountering health services in other specified circumstances: Secondary | ICD-10-CM | POA: Diagnosis not present

## 2021-06-21 NOTE — Progress Notes (Signed)
Chronic Care Management Pharmacy Note  06/21/2021 Name:  Alicia Sanchez MRN:  [id number] DOB:  [birth year]  Summary: COPD  Recommendations/Changes made from today's visit: Chronic Obstructive Pulmonary Disease: Uncontrolled; current treatment: Breztri Continues on [place], nebs as needed, continue to use rescue inhaler at least twice a day Reviewed new Breztri dosing--> 2 puffs twice daily (was previously only doing 1 puff once daily) We have switched to breztri due to ease of patient assistance Patient assistance supply should ship to patient by next Friday, she has a sample to get her through She quit smoking in 2021 but unfortunately started again due to family stresses GOLD Classification: II Smoking history: 35-pack-year smoking history.  Active smoker Discussed smoking cessation.  She continues to try decreasing the amount of cigarettes She is not taking Chantix, she is not wearing patches She remains willing to try to quit Continue to counsel Educated on proper use of Breztri since recent transition from Trelegy to [place] patient finances. Patient [place] qualify [place] the [place]; approved on 06/16/21 for breztri, will ship to patient's home  Follow Up Plan: Telephone follow up appointment with care management team member scheduled for: 3 months  Subjective: Alicia Sanchez is an 60 y.o. year old female who is a primary patient of Janora Norlander, DO.  The CCM team was consulted for assistance with disease management and care coordination needs.    Engaged with patient by telephone for follow up visit in response to provider referral for pharmacy case management and/or care coordination services.   Consent to Services:  The patient was given information about Chronic Care Management services, agreed to services, and gave verbal consent prior to initiation of services.  Please see initial visit note for detailed documentation.   Patient Care  Team: Janora Norlander, DO as PCP - General (Family Medicine) Arnoldo Lenis, MD as PCP - Cardiology (Cardiology) Marshell Garfinkel, MD as Consulting Physician (Pulmonary Disease) Lavera Guise, [facility] (Pharmacist) Objective:  Lab Results  Component Value Date   CREATININE 0.82 06/03/2020   CREATININE 0.92 02/09/2020   CREATININE 0.82 01/06/2020    No results found for: HGBA1C Last diabetic Eye exam: No results found for: HMDIABEYEEXA  Last diabetic Foot exam: No results found for: HMDIABFOOTEX      Component Value Date/Time   CHOL 199 01/06/2020 1239   TRIG 162 (H) 01/06/2020 1239   HDL 63 01/06/2020 1239   CHOLHDL 3.2 01/06/2020 1239   LDLCALC 108 (H) 01/06/2020 1239    Hepatic Function Latest Ref Rng & Units 06/03/2020 01/06/2020 06/02/2018  Total Protein 6.0 - 8.5 g/dL 6.3 7.0 5.9(L)  Albumin 3.8 - 4.9 g/dL 4.3 4.6 3.9  AST 0 - 40 IU/L '23 21 23  ' ALT 0 - 32 IU/L 16 14 32  Alk Phosphatase 48 - 121 IU/L 76 80 79  Total Bilirubin 0.0 - 1.2 mg/dL 0.2 0.3 0.3    Lab Results  Component Value Date/Time   TSH 1.020 06/03/2020 03:05 PM   TSH 1.530 01/06/2020 12:39 PM    CBC Latest Ref Rng & Units 06/03/2020 02/09/2020 01/06/2020  WBC 3.4 - 10.8 x10E3/uL 7.4 8.8 8.7  Hemoglobin 11.1 - 15.9 g/dL 14.9 13.5 16.3(H)  Hematocrit 34.0 - 46.6 % 44.5 40.3 47.7(H)  Platelets 150 - 450 x10E3/uL 318 308 425    No results found for: VD25OH  Clinical ASCVD: No  The 10-year ASCVD risk score Mikey Bussing DC Jr., et al., 2013) is: 6.7%  Values used to calculate the score:     Age: 50 years     Sex: Female     Is Non-Hispanic African American: No     Diabetic: No     Tobacco smoker: Yes     Systolic Blood Pressure: 163 mmHg     Is BP treated: No     HDL Cholesterol: 63 mg/dL     Total Cholesterol: 199 mg/dL    Other: (CHADS2VASc if Afib, PHQ9 if depression, MMRC or CAT for COPD, ACT, DEXA)  Social History   Tobacco Use  Smoking Status Former   Packs/day: 0.50   Years: 42.00    Pack years: 21.00   Types: Cigarettes  Smokeless Tobacco Never   BP Readings from Last 3 Encounters:  05/19/21 132/82  02/24/21 105/68  01/27/21 116/76   Pulse Readings from Last 3 Encounters:  05/19/21 100  02/24/21 97  01/27/21 (!) 108   Wt Readings from Last 3 Encounters:  05/19/21 173 lb 6.4 oz (78.7 kg)  02/24/21 178 lb 9.6 oz (81 kg)  01/27/21 174 lb 3.2 oz (79 kg)    Assessment: Review of patient past medical history, allergies, medications, health status, including review of consultants reports, laboratory and other test data, was performed as part of comprehensive evaluation and provision of chronic care management services.   SDOH:  (Social Determinants of Health) assessments and interventions performed:    CCM Care Plan  Allergies  Allergen Reactions   Doxycycline     Chest pain    Medications Reviewed Today     Reviewed by Janora Norlander, DO (Physician) on 06/14/21 at [phone]  Med List Status: <None>   Medication Order Taking? Sig Documenting Provider Last Dose Status Informant  albuterol (VENTOLIN HFA) 108 (90 Base) MCG/ACT inhaler [id number] No Inhale 2 puffs into the lungs every 6 (six) hours as needed for wheezing or shortness of breath. Marshell Garfinkel, MD Taking Active   alendronate (FOSAMAX) 70 MG tablet [id number]  TAKE 1 TABLET EVERY 7 DAYS WITH A FULL GLASS OF WATER ON AN EMPTY STOMACH Ronnie Doss M, DO  Active   ALPRAZolam (XANAX) 0.5 MG tablet [id number]  Take 0.5-1 tablets (0.25-0.5 mg total) by mouth 2 (two) times daily as needed for anxiety. Mozingo, Berdie Ogren, NP  Active   aspirin EC 81 MG tablet [id number] No Take 81 mg by mouth daily. [provider] Taking Active   atorvastatin (LIPITOR) 40 MG tablet [id number]  TAKE 1 TABLET EVERY DAY (NEED MD APPOINTMENT FOR REFILLS) Arnoldo Lenis, MD  Active   Budeson-Glycopyrrol-Formoterol (BREZTRI AEROSPHERE) 160-9-4.8 MCG/ACT Hollie Salk [id number]  Inhale 2 puffs into the lungs 2 (two)  times daily. Ronnie Doss M, DO  Active   busPIRone (BUSPAR) 30 MG tablet [id number] No Take 1 tablet (30 mg total) by mouth 2 (two) times daily. Ronnie Doss M, DO Taking Active   calcium carbonate (OS-CAL) 1250 (500 Ca) MG chewable tablet [id number]  Chew 1 tablet (1,250 mg total) by mouth daily. Ronnie Doss M, DO  Active   diltiazem (CARDIZEM) 30 MG tablet [id number]  TAKE 1 TABLET TWICE DAILY AS NEEDED Arnoldo Lenis, MD  Active   DULoxetine (CYMBALTA) 60 MG capsule [id number]  TAKE 2 CAPSULES (120 MG TOTAL) EVERY DAY Ronnie Doss M, DO  Active   EQ ALLERGY RELIEF, CETIRIZINE, 10 MG tablet [id number] No Take 1 tablet by mouth once daily Gottschalk, Ashly M, DO Taking Active   ipratropium-albuterol (DUONEB) 0.5-2.5 (3) MG/3ML SOLN [id number]  No Take 3 mLs by nebulization every 6 (six) hours as needed. Marshell Garfinkel, MD Taking Active   meclizine (ANTIVERT) 25 MG tablet [id number] No TAKE 1 TABLET BY MOUTH THREE TIMES DAILY AS NEEDED FOR DIZZINESS Ronnie Doss M, DO Taking Active   meloxicam (MOBIC) 7.5 MG tablet [id number]  TAKE 1 TABLET EVERY DAY Ronnie Doss M, DO  Active   Multiple Vitamin (MULTIVITAMIN WITH MINERALS) TABS tablet [id number] No Take 1 tablet by mouth daily. [provider] Taking Active Self  Multiple Vitamins-Minerals (VITAMIN D3 COMPLETE PO) [id number] No Take by mouth 2 (two) times daily. [provider] Taking Active   ramelteon (ROZEREM) 8 MG tablet [id number]  Take 1 tablet (8 mg total) by mouth at bedtime. Insomnia refractory to Melatonin, Doxepin, Xanax Janora Norlander, DO  Active   Respiratory Therapy Supplies (NEBULIZER/TUBING/MOUTHPIECE) KIT [id number] No 1 Units 4 (four) times daily by Does not apply route. GIVE face mask and tubing for nebulizer Terald Sleeper, PA-C Taking Active Self  rOPINIRole (REQUIP) 1 MG tablet [id number]  TAKE 1 TO 4 TABLETS AT BEDTIME Ronnie Doss M, DO  Active   vitamin B-12 (CYANOCOBALAMIN)  100 MCG tablet [id number] No Take 100 mcg by mouth daily. [provider] Taking Active   vitamin C (ASCORBIC ACID) 250 MG tablet [id number] No Take by mouth daily. 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   Other spondylosis with radiculopathy, cervical region 12/21/2018   Chronic left shoulder pain 11/24/2018   Arthritis 11/24/2018   Mixed stress and urge urinary incontinence 08/12/2018   Actinic keratosis 07/17/2018   Chronic respiratory failure with hypoxia ([place]) 07/16/2018   Abnormal findings on diagnostic imaging of lung 06/18/2018   Lung nodules 08/21/2017   GOLD COPD II D 08/21/2017   Recurrent UTI 08/21/2017   Generalized anxiety disorder 03/22/2017   Moderate episode of recurrent major depressive disorder ([place]) 12/16/2016   Restless legs 12/16/2016   OAB (overactive bladder) 12/16/2016   Leukocytosis 11/19/2015   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   Tobacco abuse 11/19/2015    Immunization History  Administered Date(s) Administered   Influenza Split 08/25/2016   Influenza,inj,Quad PF,6+ Mos 09/25/2017, 07/16/2018, 10/25/2020   Influenza-Unspecified 07/16/2018   Moderna Sars-Covid-2 Vaccination 01/19/2020, 02/16/2020   Pneumococcal Polysaccharide-23 05/23/2018   Tdap 05/19/2007, 07/08/2014    Conditions to be addressed/monitored: DMII  Care Plan : PHARMD MEDICATION MANAGEMENT  Updates made by Lavera Guise, [place] since 06/21/2021 12:00 AM     Problem: DISEASE PROGRESION PREVENTION      Long-Range Goal: COPD   Recent Progress: Not on track  Priority: High  Note:   Current Barriers:  Unable to independently afford treatment regimen Unable to achieve control of COPD   Pharmacist Clinical Goal(s):  Over the next 90 days, patient will verbalize ability to afford treatment  regimen achieve control of COPD as evidenced by [place], IMPROVED QUALITY OF LIFE  through collaboration with PharmD and provider.    Interventions: 1:1 collaboration with Janora Norlander, DO regarding development and update of comprehensive plan of care as evidenced by provider attestation and co-signature Inter-disciplinary care team collaboration (see longitudinal plan of care) Comprehensive medication review performed; medication list updated in electronic medical record  Chronic Obstructive Pulmonary  Disease: Uncontrolled; current treatment: Breztri Continues on [place], nebs as needed, continue to use rescue inhaler at least twice a day Reviewed new Breztri dosing--> 2 puffs twice daily (was previously only doing 1 puff once daily) We have switched to breztri due to ease of patient assistance Patient assistance supply should ship to patient by next Friday, she has a sample to get her through She quit smoking in 2021 but unfortunately started again due to family stresses GOLD Classification: II Smoking history: 35-pack-year smoking history.  Active smoker Discussed smoking cessation.  She continues to try decreasing the amount of cigarettes She is not taking Chantix, she is not wearing patches She remains willing to try to quit Continue to counsel Most recent Pulmonary Function Testing:  Pulmonary Functions Testing Results: 2019  TLC  Date Value Ref Range Status  08/18/2018 5.98 L Final   Educated on proper use of Breztri since recent transition from Treleg[facility] to [place] patient finances. Patient to qualify for the [place] patient assistance; approved on 06/16/21 for breztri, will ship to patient's home   Patient Goals/Self-Care Activities Over the next 90 days, patient will:  - take medications as prescribed Work to decrease the amount of cigarretes per day; decrease amount of rescue inhaler as able  Follow Up Plan: Telephone follow up  appointment with care management team member scheduled for: 3 months      Medication Assistance:  breztriobtained through [place] medication [place] program.  Enrollment ends 11/18/21  Patient's preferred pharmacy is:  [facility] [address] [place] [address] Phone: [phone] Fax: [facility] - [place], [place] [address] Phone: [phone] Fax: [phone]   Follow Up:  Patient agrees to Care Plan and Follow-up.  Plan: Telephone follow up appointment with care management team member scheduled for:  3 months   Regina Eck, PharmD, BCPS Clinical Pharmacist, [place]  II Phone [phone]

## 2021-06-21 NOTE — Patient Instructions (Signed)
Visit Information  PATIENT GOALS:  Goals Addressed               This Visit's Progress     Patient Stated     COPD (pt-stated)        Current Barriers:  Unable to independently afford treatment regimen Unable to achieve control of COPD   Pharmacist Clinical Goal(s):  Over the next 90 days, patient will verbalize ability to afford treatment regimen achieve control of COPD as evidenced by DECREASED USE OF RESCUE INHALER, IMPROVED QUALITY OF LIFE through collaboration with PharmD and provider.    Interventions: 1:1 collaboration with [name], DO regarding development and update of comprehensive plan of care as evidenced by provider attestation and co-signature Inter-disciplinary care team collaboration (see longitudinal plan of care) Comprehensive medication review performed; medication list updated in electronic medical record  Chronic Obstructive Pulmonary Disease: Uncontrolled; current treatment: Breztri Continues on [place], nebs as needed, continue to use rescue inhaler at least twice a day Reviewed new Breztri dosing--> 2 puffs twice daily (was previously only doing 1 puff once daily) We have switched to breztri due to ease of patient assistance Patient assistance supply should ship to patient by next Friday, she has a sample to get her through She quit smoking in 2021 but unfortunately started again due to family stresses GOLD Classification: II Smoking history: 35-pack-year smoking history.  Active smoker Discussed smoking cessation.  She continues to try decreasing the amount of cigarettes She is not taking Chantix, she is not wearing patches She remains willing to try to quit Continue to counsel Most recent Pulmonary Function Testing:  Pulmonary Functions Testing Results: 2019  TLC  Date Value Ref Range Status  08/18/2018 5.98 L Final   Educated on proper use of Breztri since recent transition from Trelegy to [place] Assessed patient finances.  Patient to qualify for the [place] patient assistance; approved on 06/16/21 for breztri, will ship to patient's home   Patient Goals/Self-Care Activities Over the next 90 days, patient will:  - take medications as prescribed Work to decrease the amount of cigarretes per day; decrease amount of rescue inhaler as able  Follow Up Plan: Telephone follow up appointment with care management team member scheduled for: 3 months         The patient verbalized understanding of instructions, educational materials, and care plan provided today and declined offer to receive copy of patient instructions, educational materials, and care plan.   Telephone follow up appointment with care management team member scheduled for: 3 months  Signature Kieth Brightly, PharmD, BCPS Clinical Pharmacist, [facility]  II Phone [phone]

## 2021-06-27 ENCOUNTER — Other Ambulatory Visit: Payer: Self-pay | Admitting: Cardiology

## 2021-06-30 ENCOUNTER — Encounter (INDEPENDENT_AMBULATORY_CARE_PROVIDER_SITE_OTHER): Payer: Self-pay

## 2021-06-30 ENCOUNTER — Other Ambulatory Visit: Payer: Self-pay

## 2021-06-30 ENCOUNTER — Telehealth: Payer: Self-pay

## 2021-06-30 MED ORDER — ATORVASTATIN CALCIUM 40 MG PO TABS: ORAL_TABLET | ORAL | 0 refills | Status: DC

## 2021-06-30 MED ORDER — DILTIAZEM HCL 30 MG PO TABS: 30.0000 mg | ORAL_TABLET | Freq: Two times a day (BID) | ORAL | 0 refills | Status: DC | PRN

## 2021-06-30 NOTE — Telephone Encounter (Signed)
Pt sent a mychart message for an appt and a medication refill for atorvastatin. Medication request has been approved and sent to [facility] pharmacy

## 2021-07-05 ENCOUNTER — Telehealth: Payer: Medicare HMO | Admitting: Adult Health

## 2021-07-06 ENCOUNTER — Encounter: Payer: Self-pay | Admitting: *Deleted

## 2021-07-14 DIAGNOSIS — J449 Chronic obstructive pulmonary disease, unspecified: Secondary | ICD-10-CM | POA: Diagnosis not present

## 2021-07-17 NOTE — Progress Notes (Signed)
Cardiology Office Note  Date: 07/18/2021   ID: Alicia, Sanchez August 22, 1961, MRN [id number]  PCP:  Janora Norlander, DO  Cardiologist:  Carlyle Dolly, MD Electrophysiologist:  None   Chief Complaint: Follow-up 1 year  History of Present Illness: Alicia Sanchez is a 60 y.o. female with a history of CAD, palpitations, chest pain, COPD, anxiety.  PFT September 2018 with severe COPD.  She was followed by pulmonary Dr. Vaughan Browner.  On home O2 at 2 L.  Echocardiogram August 2019 with EF 60 to 40% normal diastolic function.   She was last seen by Dr. Harl Bowie on 01/07/2020 via telemedicine.  Her breathing had much improved and she had nearly quit smoking.  She was wearing O2 as needed.  She was having DOE with walking short distances with palpitations.  Could occur at rest and particularly at night lasting a few minutes.  She described breathing heavy occurring daily.  Palpitations could be better with Xanax.  She was started on Coreg less than a month and symptoms were better with beta-blocker.  She was taken first dose in a.m. and second dose as needed.  She admitted to high caffeine intake.  She was taking her as needed diltiazem at the time.  She described pressure/tightness mid to left chest at rest or with exertion.  Mild to moderate pain lasting about a minute.  Shortness of breath and feel anxious.  She was limited in her activity due to her COPD.  Her recent CT for lung cancer screening showed CAD.  A coronary CT was ordered to assess for existence of CAD.  If CAD was confirmed consider daily aspirin and possible statin.  Coronary CTA report per Dr. Harl Bowie on 02/09/2020 per showed only mild plaque in the arteries but no significant blockage.Marland Kitchen  She was to start aspirin 81 mg daily and atorvastatin 40 mg daily.  She is here today for follow-up.  She states she has been having chest pain on and off for the last 2 to 3 weeks.  She points to the left midsternal area when describing the location  of the pain.  She denies any radiation or associated symptoms i.e. diaphoresis or nausea.  She states most of the time it occurs when she is resting.  She has a history of severe COPD and continues to smoke.  She uses as needed oxygen at home.  She has chronic dyspnea.  She denies any DOE or SOB out of proportion to her chronic dyspnea.  She denies any orthostatic symptoms, CVA or TIA-like symptoms, she states she has a high resting heart rate normally.  States her heart rate is usually in the low 100s.  EKG today shows normal sinus rhythm with a rate of 94 with right atrial enlargement, rightward axis, pulmonary disease pattern.  She denies any PND or orthopnea.  Denies any bleeding.  No claudication-like symptoms, DVT or PE-like symptoms or lower extremity edema.   Past Medical History:  Diagnosis Date   Anxiety    COPD (chronic obstructive pulmonary disease) ([place])    Osteoporosis     Past Surgical History:  Procedure Laterality Date   ABDOMINAL HYSTERECTOMY      Current Outpatient Medications  Medication Sig Dispense Refill   albuterol (VENTOLIN HFA) 108 (90 Base) MCG/ACT inhaler Inhale 2 puffs into the lungs every 6 (six) hours as needed for wheezing or shortness of breath. 3 each 2   alendronate (FOSAMAX) 70 MG tablet TAKE 1 TABLET EVERY 7 DAYS WITH A  FULL GLASS OF WATER ON AN EMPTY STOMACH 12 tablet 0   ALPRAZolam (XANAX) 0.5 MG tablet Take 0.5-1 tablets (0.25-0.5 mg total) by mouth 2 (two) times daily as needed for anxiety. 60 tablet 2   aspirin EC 81 MG tablet Take 81 mg by mouth daily.     atorvastatin (LIPITOR) 40 MG tablet TAKE 1 TABLET EVERY DAY (NEED MD APPOINTMENT FOR REFILLS) 60 tablet 0   Budeson-Glycopyrrol-Formoterol (BREZTRI AEROSPHERE) 160-9-4.8 MCG/ACT AERO Inhale 2 puffs into the lungs 2 (two) times daily. 10.7 g 11   busPIRone (BUSPAR) 30 MG tablet Take 1 tablet (30 mg total) by mouth 2 (two) times daily. 180 tablet 3   calcium carbonate (OS-CAL) 1250 (500 Ca) MG  chewable tablet Chew 1 tablet (1,250 mg total) by mouth daily. 100 tablet 3   diltiazem (CARDIZEM) 30 MG tablet Take 1 tablet (30 mg total) by mouth 2 (two) times daily as needed. 60 tablet 0   DULoxetine (CYMBALTA) 60 MG capsule TAKE 2 CAPSULES (120 MG TOTAL) EVERY DAY 180 capsule 0   EQ ALLERGY RELIEF, CETIRIZINE, 10 MG tablet Take 1 tablet by mouth once daily 90 tablet 0   gabapentin (NEURONTIN) 300 MG capsule Take 1 capsule (300 mg total) by mouth at bedtime. 90 capsule 0   ipratropium-albuterol (DUONEB) 0.5-2.5 (3) MG/3ML SOLN Take 3 mLs by nebulization every 6 (six) hours as needed. 1080 mL 2   meclizine (ANTIVERT) 25 MG tablet TAKE 1 TABLET BY MOUTH THREE TIMES DAILY AS NEEDED FOR DIZZINESS 180 tablet 0   meloxicam (MOBIC) 7.5 MG tablet TAKE 1 TABLET EVERY DAY 90 tablet 0   Multiple Vitamin (MULTIVITAMIN WITH MINERALS) TABS tablet Take 1 tablet by mouth daily.     Multiple Vitamins-Minerals (VITAMIN D3 COMPLETE PO) Take by mouth 2 (two) times daily.     ramelteon (ROZEREM) 8 MG tablet Take 1 tablet (8 mg total) by mouth at bedtime. Insomnia refractory to Melatonin, Doxepin, Xanax 90 tablet 3   Respiratory Therapy Supplies (NEBULIZER/TUBING/MOUTHPIECE) KIT 1 Units 4 (four) times daily by Does not apply route. GIVE face mask and tubing for nebulizer 1 each 11   rOPINIRole (REQUIP) 1 MG tablet TAKE 1 TO 4 TABLETS AT BEDTIME 360 tablet 0   vitamin B-12 (CYANOCOBALAMIN) 100 MCG tablet Take 100 mcg by mouth daily.     vitamin C (ASCORBIC ACID) 250 MG tablet Take by mouth daily.     No current facility-administered medications for this visit.   Allergies:  Doxycycline   Social History: The patient  reports that she has quit smoking. Her smoking use included cigarettes. She has a 21.00 pack-year smoking history. She has never used smokeless tobacco. She reports that she does not drink alcohol and does not use drugs.   Family History: The patient's family history includes COPD in her father,  maternal grandfather, and mother; Diabetes type II in her brother and sister.   ROS:  Please see the history of present illness. Otherwise, complete review of systems is positive for none.  All other systems are reviewed and negative.   Physical Exam: VS:  BP 116/82   Pulse (!) 105   Ht '5\' 4"'  (1.626 m)   Wt 168 lb 6.4 oz (76.4 kg)   SpO2 95%   BMI 28.91 kg/m , BMI Body mass index is 28.91 kg/m.  Wt Readings from Last 3 Encounters:  07/18/21 168 lb 6.4 oz (76.4 kg)  05/19/21 173 lb 6.4 oz (78.7 kg)  02/24/21 178 lb  9.6 oz (81 kg)    General: Patient appears comfortable at rest. Neck: Supple, no elevated JVP or carotid bruits, no thyromegaly. Lungs: Clear to auscultation, nonlabored breathing at rest. Cardiac: Regular rate and rhythm, no S3 or significant systolic murmur, no pericardial rub. Extremities: No pitting edema, distal pulses 2+. Skin: Warm and dry. Musculoskeletal: No kyphosis. Neuropsychiatric: Alert and oriented x3, affect grossly appropriate.  ECG: EKG normal sinus rhythm rate of 94, right atrial enlargement, rightward axis, pulmonary disease pattern.  Recent Labwork: No results found for requested labs within last 8760 hours.     Component Value Date/Time   CHOL 199 01/06/2020 1239   TRIG 162 (H) 01/06/2020 1239   HDL 63 01/06/2020 1239   CHOLHDL 3.2 01/06/2020 1239   LDLCALC 108 (H) 01/06/2020 1239    Other Studies Reviewed Today:    CT chest for lung cancer screening 04/03/2021 IMPRESSION: 1. Lung-RADS 2, benign appearance or behavior. Continue annual screening with low-dose chest CT without contrast in 12 months. 2. Aortic Atherosclerosis (ICD10-I70.0) and Emphysema (ICD10-J43.9).   CT CHEST WO CONTRAST, 11/20/2018 10:59 AM   INDICATION: \ R91.8 Abnormal findings on diagnostic imaging of lung   COMPARISON: None   TECHNIQUE: Multislice axial images were obtained through the chest without administration of iodinated intravenous contrast material.  Multi-planar reformatted images were generated for additional analysis. Nongated technique limits cardiac detail.       All CT scans at [facility] and [facility] are performed using dose optimization techniques as appropriate to a performed exam, including but not limited to one or more of the following: automated exposure control, adjustment of the mA and/or kV according to patient size, use of iterative reconstruction technique. In addition, [facility] is participating in the [place] program which will further assist [place] in optimizing patient radiation exposure.     02/04/2020 coronary CT FINDINGS:   Thoracic inlet: Imaged portions of the thyroid are unremarkable.       Mediastinum/hila/axilla: No adenopathy.       Heart:  - A mild burden of calcified atherosclerotic disease is evident within the coronary arterial distribution.  - Normal heart size. No pericardial effusion.       Vessels:  Aorta normal in caliber.   Central airways: Airway patent.       Lungs:  - Several 1-3 mm calcified and noncalcified nodules.  - Moderate changes of centrilobular emphysema and diffuse mild bronchial wall thickening.  - Otherwise, both lungs are well expanded/aerated.       Pleura: No pleural effusions or pneumothorax.       Upper abdomen: Unremarkable.       Chest wall\MSK: Unremarkable.    FINDINGS: Non-cardiac: See separate report from [facility].   The pulmonary veins drain normally to the left atrium. No LA appendage thrombus.   Calcium Score: 45 Agatston units.   Coronary Arteries: Left dominant with no anomalies   LM: No plaque or stenosis.   LAD system: Mixed plaque proximal LAD, no significant stenosis.   Circumflex system: Large LCx provides left PDA. Mixed plaque mid LCx with no significant stenosis.   RCA system: Small, nondominant RCA.  No plaque or stenosis.   IMPRESSION: 1. Coronary artery calcium score 45  Agatston units. This places the patient in the 85th percentile for age and gender, suggesting high risk for future cardiac events.   2.  Nonobstructive mild CAD noted.         [facility] chest CT Interface, Rad Results In - 11/20/2018  1:50 PM EST  CT CHEST WO CONTRAST, 11/20/2018 10:59 AM   INDICATION: \ R91.8 Abnormal findings on diagnostic imaging of lung   COMPARISON: None   TECHNIQUE: Multislice axial images were obtained through the chest without administration of iodinated intravenous contrast material. Multi-planar reformatted images were generated for additional analysis. Nongated technique limits cardiac detail.       All CT scans at [facility] are performed using dose optimization techniques as appropriate to a performed exam, including but not limited to one or more of the following: automated exposure control, adjustment of the mA and/or kV according to patient size, use of iterative reconstruction technique. In addition, [facility] is participating in the [place] program which will further assist [place] in optimizing patient radiation exposure.    FINDINGS:   Thoracic inlet: Imaged portions of the thyroid are unremarkable.       Mediastinum/hila/axilla: No adenopathy.       Heart:  - A mild burden of calcified atherosclerotic disease is evident within the coronary arterial distribution.  - Normal heart size. No pericardial effusion.       Vessels:  Aorta normal in caliber.   Central airways: Airway patent.       Lungs:  - Several 1-3 mm calcified and noncalcified nodules.  - Moderate changes of centrilobular emphysema and diffuse mild bronchial wall thickening.  - Otherwise, both lungs are well expanded/aerated.       Pleura: No pleural effusions or pneumothorax.       Upper abdomen: Unremarkable.       Chest wall\MSK: Unremarkable.        CONCLUSION:    1. I see no  concerning pulmonary nodules. Several 1-3 mm calcified and noncalcified nodules likely relate to benign disease. If the patient is eligible, consider participation in a lung cancer screening program which utilizes annual low dose CT imaging of the chest.   2. Moderate changes of centrilobular emphysema and diffuse mild bronchial wall thickening. Specimen Collected: -- Last Resulted: --  Date: 11/20/18       Jan 2017 Study Conclusions   - Left ventricle: The cavity size was normal. Systolic function was   vigorous. The estimated ejection fraction was in the range of 65%   to 70%. Wall motion was normal; there were no regional wall   motion abnormalities. Doppler parameters are consistent with   abnormal left ventricular relaxation (grade 1 diastolic   dysfunction). There was no evidence of elevated ventricular   filling pressure by Doppler parameters. - Aortic valve: There was no regurgitation. - Aortic root: The aortic root was normal in size. - Mitral valve: Structurally normal valve. There was mild   regurgitation. - Right ventricle: The cavity size was normal. Wall thickness was   normal. Systolic function was normal. - Right atrium: The atrium was normal in size. - Tricuspid valve: There was mild regurgitation. - Pulmonic valve: There was no regurgitation. - Pulmonary arteries: Systolic pressure was within the normal   range. - Inferior vena cava: The vessel was normal in size. - Pericardium, extracardiac: There was no pericardial effusion.     06/2018 echo Study Conclusions   - Left ventricle: The cavity size was normal. Wall thickness was   normal. Systolic function was normal. The estimated ejection   fraction was in the range of 60% to 65%. Wall motion was normal;  there were no regional wall motion abnormalities. Left   ventricular diastolic function parameters were normal. - Aortic valve: Mildly calcified annulus. Probably trileaflet. - Mitral valve: Mildly  calcified annulus. There was trivial   regurgitation. - Right ventricle: The cavity size was mildly dilated. - Right atrium: Central venous pressure (est): 3 mm Hg. - Atrial septum: No defect or patent foramen ovale was identified. - Tricuspid valve: There was trivial regurgitation. - Pericardium, extracardiac: A prominent pericardial fat pad was   present.   06/2018 event monitor 7 day event monitor Min HR 76, Max HR 143, Avg HR 93 Symptoms correlate with sinus rhythm and sinus tachycardia No significant arrhythmias Assessment and Plan:  1. Chest pain of uncertain etiology   2. CAD in native artery   3. Chronic obstructive pulmonary disease, unspecified COPD type ([place])    1. Chest pain of uncertain etiology She has been complaining of chest pain on and off over the last 2 to 3 weeks.  She states the chest pain occurs at rest.  Lasting 1 to 2 minutes.  She denies any radiation to neck, arm, back, jaw.  Denies any associated nausea, vomiting, diaphoresis.  Denies any aggravating or alleviating factors.  Please get a follow-up coronary CTA to assess for ischemic etiology.  2. CAD in native artery Previous CTA On 02/04/2020 with CAC score of 45.  Left main with no plaque or stenosis.  LAD mixed plaque proximal LAD, no significant stenosis.  Circumflex system, large LCx provides left PDA.  Mixed plaque mid LCx with no significant stenosis.  RCA system small, nondominant RCA, no plaque or stenosis.  CAC score 45 placed patient in 63 percentile for age and gender suggesting high risk for future cardiac events.  Continue aspirin 81 mg daily, atorvastatin 40 mg daily add sublingual nitroglycerin as needed.  3. Chronic obstructive pulmonary disease, unspecified COPD type ([place]) Severe COPD by PFTs in 2018.  She uses as needed oxygen at home.  She continues to smoke.  She states she knows she needs to quit.  Recent significant stress in her life causing her to smoke more.  She states at 1 point she was  down to 2 cigarettes/day.  Encourage cessation.  Medication Adjustments/Labs and Tests Ordered: Current medicines are reviewed at length with the patient today.  Concerns regarding medicines are outlined above.   Disposition: Follow-up with Dr. Harl Bowie or APP 6 to 8 weeks  Signed, Levell July, NP 07/18/2021 9:27 AM    [facility] [address], [place], [place] [address] Phone: [phone]; Fax: [phone]

## 2021-07-18 ENCOUNTER — Ambulatory Visit: Payer: Medicare HMO | Admitting: Family Medicine

## 2021-07-18 ENCOUNTER — Encounter: Payer: Self-pay | Admitting: Family Medicine

## 2021-07-18 ENCOUNTER — Other Ambulatory Visit: Payer: Self-pay

## 2021-07-18 VITALS — BP 116/82 | HR 105 | Ht 64.0 in | Wt 168.4 lb

## 2021-07-18 DIAGNOSIS — R079 Chest pain, unspecified: Secondary | ICD-10-CM | POA: Diagnosis not present

## 2021-07-18 DIAGNOSIS — M5412 Radiculopathy, cervical region: Secondary | ICD-10-CM | POA: Diagnosis not present

## 2021-07-18 DIAGNOSIS — I2510 Atherosclerotic heart disease of native coronary artery without angina pectoris: Secondary | ICD-10-CM | POA: Diagnosis not present

## 2021-07-18 DIAGNOSIS — I10 Essential (primary) hypertension: Secondary | ICD-10-CM | POA: Diagnosis not present

## 2021-07-18 DIAGNOSIS — J449 Chronic obstructive pulmonary disease, unspecified: Secondary | ICD-10-CM | POA: Diagnosis not present

## 2021-07-18 DIAGNOSIS — R002 Palpitations: Secondary | ICD-10-CM

## 2021-07-18 DIAGNOSIS — M5417 Radiculopathy, lumbosacral region: Secondary | ICD-10-CM | POA: Diagnosis not present

## 2021-07-18 DIAGNOSIS — Z6828 Body mass index (BMI) 28.0-28.9, adult: Secondary | ICD-10-CM | POA: Diagnosis not present

## 2021-07-18 DIAGNOSIS — M47812 Spondylosis without myelopathy or radiculopathy, cervical region: Secondary | ICD-10-CM | POA: Diagnosis not present

## 2021-07-18 DIAGNOSIS — Z01810 Encounter for preprocedural cardiovascular examination: Secondary | ICD-10-CM

## 2021-07-18 MED ORDER — METOPROLOL TARTRATE 100 MG PO TABS: 100.0000 mg | ORAL_TABLET | ORAL | 0 refills | Status: DC

## 2021-07-18 MED ORDER — NITROGLYCERIN 0.4 MG SL SUBL: 0.4000 mg | SUBLINGUAL_TABLET | SUBLINGUAL | 1 refills | Status: DC | PRN

## 2021-07-18 NOTE — Patient Instructions (Addendum)
Medication Instructions:  Your physician recommends that you continue on your current medications as directed. Please refer to the Current Medication list given to you today.  Labwork: BMET in 1-2 weeks at [facility]  Testing/Procedures: Coronary CTA  Follow-Up: Your physician recommends that you schedule a follow-up appointment in: 6-8 weeks  Any Other Special Instructions Will Be Listed Below (If Applicable).  If you need a refill on your cardiac medications before your next appointment, please call your pharmacy.    Your cardiac CT will be scheduled at one of the below locations:   [facility] [address] [place], [place] [address] [phone]  If scheduled at [facility], please arrive at the [facility] main entrance (entrance A) of [facility] 30 minutes prior to test start time. Proceed to the [facility] Radiology Department (first floor) to check-in and test prep.  Please follow these instructions carefully (unless otherwise directed):  On the Night Before the Test: Be sure to Drink plenty of water. Do not consume any caffeinated/decaffeinated beverages or chocolate 12 hours prior to your test. Do not take any antihistamines 12 hours prior to your test (zyrtec, meclizine)  On the Day of the Test: Drink plenty of water until 1 hour prior to the test. Do not eat any food 4 hours prior to the test. You may take your regular medications prior to the test.  Take metoprolol (Lopressor) 1&1/2 to 2 hours prior to test. FEMALES- please wear underwire-free bra if available, avoid dresses & tight clothing      After the Test: Drink plenty of water. After receiving IV contrast, you may experience a mild flushed feeling. This is normal. On occasion, you may experience a mild rash up to 24 hours after the test. This is not dangerous. If this occurs, you can take Benadryl 25 mg and increase your fluid intake. If you experience trouble  breathing, this can be serious. If it is severe call 911 IMMEDIATELY. If it is mild, please call our office.  Please allow 2-4 weeks for scheduling of routine cardiac CTs. Some insurance companies require a pre-authorization which may delay scheduling of this test.   For non-scheduling related questions, please contact the cardiac imaging nurse navigator should you have any questions/concerns: Rockwell Alexandria, Cardiac Imaging Nurse Navigator Larey Brick, Cardiac Imaging Nurse Navigator [facility] Direct Office Dial: [phone]   For scheduling needs, including cancellations and rescheduling, please call [place], [phone].

## 2021-07-18 NOTE — Addendum Note (Signed)
Addended by: Eustace Moore on: 07/18/2021 09:55 AM   Modules accepted: Orders

## 2021-07-19 ENCOUNTER — Encounter: Payer: Self-pay | Admitting: Family Medicine

## 2021-07-19 ENCOUNTER — Other Ambulatory Visit (HOSPITAL_COMMUNITY)
Admission: RE | Admit: 2021-07-19 | Discharge: 2021-07-19 | Disposition: A | Discharge status: Home / Self Care | Payer: Medicare HMO | Source: Ambulatory Visit | Attending: Family Medicine | Admitting: Family Medicine

## 2021-07-19 ENCOUNTER — Ambulatory Visit: Payer: Medicare HMO | Admitting: Family Medicine

## 2021-07-19 ENCOUNTER — Telehealth: Payer: Medicare HMO | Admitting: Adult Health

## 2021-07-19 DIAGNOSIS — R079 Chest pain, unspecified: Secondary | ICD-10-CM | POA: Diagnosis not present

## 2021-07-19 DIAGNOSIS — Z01810 Encounter for preprocedural cardiovascular examination: Secondary | ICD-10-CM | POA: Insufficient documentation

## 2021-07-19 DIAGNOSIS — F489 Nonpsychotic mental disorder, unspecified: Secondary | ICD-10-CM

## 2021-07-19 LAB — BASIC METABOLIC PANEL
Anion gap: 5 (ref 5–15)
BUN: 10 mg/dL (ref 6–20)
CO2: 27 mmol/L (ref 22–32)
Calcium: 9 mg/dL (ref 8.9–10.3)
Chloride: 106 mmol/L (ref 98–111)
Creatinine, Ser: 0.72 mg/dL (ref 0.44–1.00)
GFR, Estimated: >60 mL/min (ref >60)
Glucose, Bld: 104 mg/dL — ABNORMAL HIGH (ref 70–99)
Potassium: 3.6 mmol/L (ref 3.5–5.1)
Sodium: 138 mmol/L (ref 135–145)

## 2021-07-19 NOTE — Progress Notes (Signed)
Patient no show video appointment. Called and LVM for a return call.

## 2021-07-20 ENCOUNTER — Telehealth: Payer: Self-pay | Admitting: *Deleted

## 2021-07-20 NOTE — Telephone Encounter (Signed)
Lesle Chris, LPN  01/25/1839  3:75 PM EDT Back to Top    Notified, copy to pcp.

## 2021-07-20 NOTE — Telephone Encounter (Signed)
-----   Message from Netta Neat., NP sent at 07/20/2021  7:56 AM EDT ----- Labs look good except for slightly elevated blood glucose Netta Neat, NP  07/20/2021 7:56 AM

## 2021-07-24 ENCOUNTER — Other Ambulatory Visit: Payer: Self-pay | Admitting: Cardiology

## 2021-07-26 ENCOUNTER — Encounter: Payer: Self-pay | Admitting: *Deleted

## 2021-07-26 ENCOUNTER — Ambulatory Visit (HOSPITAL_COMMUNITY): Payer: Medicare HMO

## 2021-07-26 ENCOUNTER — Telehealth: Payer: Self-pay | Admitting: *Deleted

## 2021-07-26 DIAGNOSIS — R079 Chest pain, unspecified: Secondary | ICD-10-CM

## 2021-07-26 NOTE — Telephone Encounter (Signed)
Lexiscan scheduled for Thursday, 08/03/2021 at [facility] - arrive at 9:15 am.    Patient will be notified via my chart.  Instruction letter sent as well.

## 2021-07-26 NOTE — Telephone Encounter (Signed)
Alicia Sanchez,  please call the patient this morning and  cancel the CT scan per the note below. Call her and tell her we will schedule a Lexiscan stress test instead.   I did not have chance to address this today. Please call the patient and tell her we are cancelling the CT scan and will schedule her for Lexiscan if she is agreeable . Thank you    Per Rockwell Alexandria RN Navigator Cardiac Imaging  [facility] -   "Conclusions Nondiabetic patients for whom a prior CCTA <3 years showed no or =25% coronary stenosis had low prevalence of obstructive CAD, even if the clinical scenario considered a CCTA examination appropriate. Patients with completely normal coronary arteries at the initial scan had only 1.8% prevalence of significant stenosis irrespective of other factors. After further validation, these findings may contribute to expanding the clinical guideline scenarios, resulting in improvement of appropriate patient care and efficient use of resources."   https://www.ahajournals.org/doi/full/10.1161/CIRCIMAGING.113.001549   based on no significant CAD on last scan, interval of repeat scan may be low yield. If strong clinical concern persists, can consider repeating CTA however would consider noncardiac causes of chest pain as well

## 2021-07-26 NOTE — Telephone Encounter (Signed)
Patient notified and verbalized understanding that CT would be cancelled this morning.    She agrees to doing the [place].

## 2021-07-31 ENCOUNTER — Ambulatory Visit
Admission: RE | Admit: 2021-07-31 | Discharge: 2021-07-31 | Disposition: A | Discharge status: Home / Self Care | Payer: Medicare HMO | Source: Ambulatory Visit | Attending: Family Medicine | Admitting: Family Medicine

## 2021-07-31 ENCOUNTER — Other Ambulatory Visit: Payer: Self-pay

## 2021-07-31 ENCOUNTER — Encounter: Payer: Self-pay | Admitting: Family Medicine

## 2021-07-31 ENCOUNTER — Ambulatory Visit (INDEPENDENT_AMBULATORY_CARE_PROVIDER_SITE_OTHER): Payer: Medicare HMO | Admitting: Family Medicine

## 2021-07-31 ENCOUNTER — Other Ambulatory Visit: Payer: Self-pay | Admitting: Family Medicine

## 2021-07-31 VITALS — BP 105/76 | HR 101 | Temp 97.5°F | Ht 64.0 in | Wt 165.0 lb

## 2021-07-31 DIAGNOSIS — Z1231 Encounter for screening mammogram for malignant neoplasm of breast: Secondary | ICD-10-CM

## 2021-07-31 DIAGNOSIS — L989 Disorder of the skin and subcutaneous tissue, unspecified: Secondary | ICD-10-CM

## 2021-07-31 NOTE — Patient Instructions (Signed)
Seborrheic Keratosis A seborrheic keratosis is a common, noncancerous (benign) skin growth. These growths are velvety, waxy, rough, tan, brown, or black spots that appear on the skin. These skin growths can be flat or raised, andscaly. What are the causes? The cause of this condition is not known. What increases the risk? You are more likely to develop this condition if you: Have a family history of seborrheic keratosis. Are 50 or older. Are pregnant. Have had estrogen replacement therapy. What are the signs or symptoms? Symptoms of this condition include growths on the face, chest, shoulders, back, or other areas. These growths: Are usually painless, but may become irritated and itchy. Can be yellow, brown, black, or other colors. Are slightly raised or have a flat surface. Are sometimes rough or wart-like in texture. Are often velvety or waxy on the surface. Are round or oval-shaped. Often occur in groups, but may occur as a single growth. How is this diagnosed? This condition is diagnosed with a medical history and physical exam. A sample of the growth may be tested (skin biopsy). You may need to see a skin specialist (dermatologist). How is this treated? Treatment is not usually needed for this condition, unless the growths are irritated or bleed often. You may also choose to have the growths removed if you do not like their appearance. Most commonly, these growths are treated with a procedure in which liquid nitrogen is applied to "freeze" off the growth (cryosurgery). They may also be burned off with electricity (electrocautery) or removed by scraping (curettage). Follow these instructions at home: Watch your growth for any changes. Keep all follow-up visits as told by your health care provider. This is important. Do not scratch or pick at the growth or growths. This can cause them to become irritated or infected. Contact a health care provider if: You suddenly have many new  growths. Your growth bleeds, itches, or hurts. Your growth suddenly becomes larger or changes color. Summary A seborrheic keratosis is a common, noncancerous (benign) skin growth. Treatment is not usually needed for this condition, unless the growths are irritated or bleed often. Watch your growth for any changes. Contact a health care provider if you suddenly have many new growths or your growth suddenly becomes larger or changes color. Keep all follow-up visits as told by your health care provider. This is important. This information is not intended to replace advice given to you by your health care provider. Make sure you discuss any questions you have with your healthcare provider. Document Revised: 03/20/2018 Document Reviewed: 03/20/2018 Elsevier Patient Education  2022 Elsevier Inc.  

## 2021-07-31 NOTE — Progress Notes (Signed)
Subjective: XB:MWUX PCP: Janora Norlander, DO LKG:MWNUUV Alicia Sanchez is a 60 y.o. female presenting to clinic today for:  1. Pain hands/ feet/ legs Patient was seen by her neurosurgery office on 30 August.  At that time her pain was a 4 out of 10 and primarily located to bilateral feet.  She was encouraged to start alpha lipoic acid she is treated with meloxicam, gabapentin and Cymbalta.  Previously treated with doxepin as well but did not find this to be helpful as that was discontinued.  She sees psychiatry and is chronically on a benzodiazepine.  She had C-spine injections which have helped her upper extremity issues.  She notes that the symptoms have improved greatly after her injection therapies.  2.  Mole on chest She reports a mole that is on the center of her chest.  She notes that it crusts but is not sure if it is getting any larger in size.  Uncertain as to how long its been there.  She has a daughter who suffers from multiple pigmented lesions and is under the care of dermatology for this.  She is not personally established with a dermatologist but would like 1.  No reports of spontaneous bleeding but it does catch on her bra sometimes.   ROS: Per HPI  Allergies  Allergen Reactions   Doxycycline     Chest pain   Past Medical History:  Diagnosis Date   Anxiety    COPD (chronic obstructive pulmonary disease) (HCC)    Osteoporosis     Current Outpatient Medications:    albuterol (VENTOLIN HFA) 108 (90 Base) MCG/ACT inhaler, Inhale 2 puffs into the lungs every 6 (six) hours as needed for wheezing or shortness of breath., Disp: 3 each, Rfl: 2   alendronate (FOSAMAX) 70 MG tablet, TAKE 1 TABLET EVERY 7 DAYS WITH A FULL GLASS OF WATER ON AN EMPTY STOMACH, Disp: 12 tablet, Rfl: 0   ALPRAZolam (XANAX) 0.5 MG tablet, Take 0.5-1 tablets (0.25-0.5 mg total) by mouth 2 (two) times daily as needed for anxiety., Disp: 60 tablet, Rfl: 2   aspirin EC 81 MG tablet, Take 81 mg by mouth  daily., Disp: , Rfl:    atorvastatin (LIPITOR) 40 MG tablet, TAKE 1 TABLET EVERY DAY (NEED MD APPOINTMENT FOR REFILLS), Disp: 60 tablet, Rfl: 0   Budeson-Glycopyrrol-Formoterol (BREZTRI AEROSPHERE) 160-9-4.8 MCG/ACT AERO, Inhale 2 puffs into the lungs 2 (two) times daily., Disp: 10.7 g, Rfl: 11   busPIRone (BUSPAR) 30 MG tablet, Take 1 tablet (30 mg total) by mouth 2 (two) times daily., Disp: 180 tablet, Rfl: 3   calcium carbonate (OS-CAL) 1250 (500 Ca) MG chewable tablet, Chew 1 tablet (1,250 mg total) by mouth daily., Disp: 100 tablet, Rfl: 3   diltiazem (CARDIZEM) 30 MG tablet, TAKE 1 TABLET TWICE DAILY AS NEEDED (NEED MD APPOINTMENT), Disp: 60 tablet, Rfl: 4   DULoxetine (CYMBALTA) 60 MG capsule, TAKE 2 CAPSULES (120 MG TOTAL) EVERY DAY, Disp: 180 capsule, Rfl: 0   EQ ALLERGY RELIEF, CETIRIZINE, 10 MG tablet, Take 1 tablet by mouth once daily, Disp: 90 tablet, Rfl: 0   gabapentin (NEURONTIN) 300 MG capsule, Take 1 capsule (300 mg total) by mouth at bedtime., Disp: 90 capsule, Rfl: 0   ipratropium-albuterol (DUONEB) 0.5-2.5 (3) MG/3ML SOLN, Take 3 mLs by nebulization every 6 (six) hours as needed., Disp: 1080 mL, Rfl: 2   meclizine (ANTIVERT) 25 MG tablet, TAKE 1 TABLET BY MOUTH THREE TIMES DAILY AS NEEDED FOR DIZZINESS, Disp: 180 tablet,  Rfl: 0   meloxicam (MOBIC) 7.5 MG tablet, TAKE 1 TABLET EVERY DAY, Disp: 90 tablet, Rfl: 0   metoprolol tartrate (LOPRESSOR) 100 MG tablet, Take 1 tablet (100 mg total) by mouth as directed. Take 1 & 1/2-2 hours before your ct scan, Disp: 1 tablet, Rfl: 0   Multiple Vitamin (MULTIVITAMIN WITH MINERALS) TABS tablet, Take 1 tablet by mouth daily., Disp: , Rfl:    Multiple Vitamins-Minerals (VITAMIN D3 COMPLETE PO), Take by mouth 2 (two) times daily., Disp: , Rfl:    nitroGLYCERIN (NITROSTAT) 0.4 MG SL tablet, Place 1 tablet (0.4 mg total) under the tongue every 5 (five) minutes x 3 doses as needed for chest pain (if o relief after 2nd dose, proceed to the ED for an  evaluation or call 911)., Disp: 25 tablet, Rfl: 1   ramelteon (ROZEREM) 8 MG tablet, Take 1 tablet (8 mg total) by mouth at bedtime. Insomnia refractory to Melatonin, Doxepin, Xanax, Disp: 90 tablet, Rfl: 3   Respiratory Therapy Supplies (NEBULIZER/TUBING/MOUTHPIECE) KIT, 1 Units 4 (four) times daily by Does not apply route. GIVE face mask and tubing for nebulizer, Disp: 1 each, Rfl: 11   rOPINIRole (REQUIP) 1 MG tablet, TAKE 1 TO 4 TABLETS AT BEDTIME, Disp: 360 tablet, Rfl: 0   vitamin B-12 (CYANOCOBALAMIN) 100 MCG tablet, Take 100 mcg by mouth daily., Disp: , Rfl:    vitamin C (ASCORBIC ACID) 250 MG tablet, Take by mouth daily., Disp: , Rfl:  Social History   Socioeconomic History   Marital status: Married    Spouse name: Not on file   Number of children: Not on file   Years of education: Not on file   Highest education level: Not on file  Occupational History   Not on file  Tobacco Use   Smoking status: Former    Packs/day: 0.50    Years: 42.00    Pack years: 21.00    Types: Cigarettes   Smokeless tobacco: Never  Vaping Use   Vaping Use: Never used  Substance and Sexual Activity   Alcohol use: No   Drug use: No   Sexual activity: Yes    Birth control/protection: Surgical  Other Topics Concern   Not on file  Social History Narrative   Not on file   Social Determinants of Health   Financial Resource Strain: Not on file  Food Insecurity: Not on file  Transportation Needs: Not on file  Physical Activity: Not on file  Stress: Not on file  Social Connections: Not on file  Intimate Partner Violence: Not on file   Family History  Problem Relation Age of Onset   COPD Mother    COPD Father    Diabetes type II Sister    Diabetes type II Brother    COPD Maternal Grandfather     Objective: Office vital signs reviewed. BP 105/76   Pulse (!) 101   Temp (!) 97.5 F (36.4 C)   Ht _0  (1.626 m)   Wt 165 lb (74.8 kg)   SpO2 95%   BMI 28.32 kg/m   Physical  Examination:  General: Awake, alert, well nourished, No acute distress MSK: Ambulating independently Skin: Fitzpatrick 2.  She has a 3 and half millimeter papular lesion at the center of her chest with associated pigment and flaking.  No central umbilication, rolled borders or appreciable hypervascularity.  No active bleeding.  Nontender.  Assessment/ Plan: 60 y.o. female   Skin lesion of chest wall - Plan: Ambulatory referral to  Dermatology  Seems consistent with a seborrheic keratosis.  Though does have some flaking which is unusual.  I have referred her to dermatology for further evaluation  No orders of the defined types were placed in this encounter.  No orders of the defined types were placed in this encounter.    Janora Norlander, DO [place] [phone]

## 2021-08-03 ENCOUNTER — Encounter (HOSPITAL_COMMUNITY): Payer: Self-pay

## 2021-08-03 ENCOUNTER — Ambulatory Visit (HOSPITAL_COMMUNITY)
Admission: RE | Admit: 2021-08-03 | Discharge: 2021-08-03 | Disposition: A | Discharge status: Home / Self Care | Payer: Medicare HMO | Source: Ambulatory Visit | Attending: Family Medicine | Admitting: Family Medicine

## 2021-08-03 ENCOUNTER — Other Ambulatory Visit: Payer: Self-pay

## 2021-08-03 ENCOUNTER — Encounter (HOSPITAL_COMMUNITY)
Admission: RE | Admit: 2021-08-03 | Discharge: 2021-08-03 | Disposition: A | Discharge status: Home / Self Care | Payer: Medicare HMO | Source: Ambulatory Visit | Attending: Family Medicine | Admitting: Family Medicine

## 2021-08-03 DIAGNOSIS — R079 Chest pain, unspecified: Secondary | ICD-10-CM

## 2021-08-03 LAB — NM MYOCAR MULTI W/SPECT W/WALL MOTION / EF
LV dias vol: 47 mL (ref 46–106)
LV sys vol: 9 mL
Nuc Stress EF: 80 %
Peak HR: 92 {beats}/min
RATE: 0.3
Rest HR: 65 {beats}/min
Rest Nuclear Isotope Dose: 10.3 mCi
SDS: 1
SRS: 1
SSS: 2
ST Depression (mm): 0 mm
Stress Nuclear Isotope Dose: 29 mCi
TID: 1.07

## 2021-08-03 MED ORDER — TECHNETIUM TC 99M TETROFOSMIN IV KIT
10.0000 | PACK | Freq: Once | INTRAVENOUS | Status: AC | PRN
  Administered 2021-08-03: 10.3 via INTRAVENOUS

## 2021-08-03 MED ORDER — SODIUM CHLORIDE FLUSH 0.9 % IV SOLN
INTRAVENOUS | Status: AC
  Administered 2021-08-03: 10 mL via INTRAVENOUS
  Filled 2021-08-03: qty 10

## 2021-08-03 MED ORDER — TECHNETIUM TC 99M TETROFOSMIN IV KIT
30.0000 | PACK | Freq: Once | INTRAVENOUS | Status: AC | PRN
  Administered 2021-08-03: 29 via INTRAVENOUS

## 2021-08-03 MED ORDER — REGADENOSON 0.4 MG/5ML IV SOLN
INTRAVENOUS | Status: AC
  Administered 2021-08-03: 0.4 mg via INTRAVENOUS
  Filled 2021-08-03: qty 5

## 2021-08-08 ENCOUNTER — Encounter (HOSPITAL_COMMUNITY): Payer: Self-pay

## 2021-08-08 ENCOUNTER — Encounter: Payer: Self-pay | Admitting: *Deleted

## 2021-08-08 ENCOUNTER — Other Ambulatory Visit: Payer: Self-pay

## 2021-08-08 ENCOUNTER — Emergency Department (HOSPITAL_COMMUNITY): Payer: Medicare HMO

## 2021-08-08 ENCOUNTER — Emergency Department (HOSPITAL_COMMUNITY)
Admission: EM | Admit: 2021-08-08 | Discharge: 2021-08-08 | Disposition: A | Discharge status: Home / Self Care | Payer: Medicare HMO | Attending: Emergency Medicine | Admitting: Emergency Medicine

## 2021-08-08 DIAGNOSIS — R0789 Other chest pain: Secondary | ICD-10-CM | POA: Insufficient documentation

## 2021-08-08 DIAGNOSIS — Z87891 Personal history of nicotine dependence: Secondary | ICD-10-CM | POA: Diagnosis not present

## 2021-08-08 DIAGNOSIS — R079 Chest pain, unspecified: Secondary | ICD-10-CM

## 2021-08-08 DIAGNOSIS — R0602 Shortness of breath: Secondary | ICD-10-CM | POA: Diagnosis not present

## 2021-08-08 DIAGNOSIS — R1010 Upper abdominal pain, unspecified: Secondary | ICD-10-CM | POA: Insufficient documentation

## 2021-08-08 DIAGNOSIS — J449 Chronic obstructive pulmonary disease, unspecified: Secondary | ICD-10-CM | POA: Insufficient documentation

## 2021-08-08 DIAGNOSIS — Z7951 Long term (current) use of inhaled steroids: Secondary | ICD-10-CM | POA: Diagnosis not present

## 2021-08-08 DIAGNOSIS — Z7982 Long term (current) use of aspirin: Secondary | ICD-10-CM | POA: Insufficient documentation

## 2021-08-08 LAB — BASIC METABOLIC PANEL
Anion gap: 8 (ref 5–15)
BUN: 5 mg/dL — ABNORMAL LOW (ref 6–20)
CO2: 25 mmol/L (ref 22–32)
Calcium: 9.2 mg/dL (ref 8.9–10.3)
Chloride: 105 mmol/L (ref 98–111)
Creatinine, Ser: 0.82 mg/dL (ref 0.44–1.00)
GFR, Estimated: >60 mL/min (ref >60)
Glucose, Bld: 101 mg/dL — ABNORMAL HIGH (ref 70–99)
Potassium: 3.8 mmol/L (ref 3.5–5.1)
Sodium: 138 mmol/L (ref 135–145)

## 2021-08-08 LAB — CBC
HCT: 46.4 % — ABNORMAL HIGH (ref 36.0–46.0)
Hemoglobin: 16.1 g/dL — ABNORMAL HIGH (ref 12.0–15.0)
MCH: 31.9 pg (ref 26.0–34.0)
MCHC: 34.7 g/dL (ref 30.0–36.0)
MCV: 92.1 fL (ref 80.0–100.0)
Platelets: 274 10*3/uL (ref 150–400)
RBC: 5.04 MIL/uL (ref 3.87–5.11)
RDW: 12.3 % (ref 11.5–15.5)
WBC: 12.9 10*3/uL — ABNORMAL HIGH (ref 4.0–10.5)
nRBC: 0 % (ref 0.0–0.2)

## 2021-08-08 LAB — TROPONIN I (HIGH SENSITIVITY)
Troponin I (High Sensitivity): 3 ng/L (ref <18)
Troponin I (High Sensitivity): 2 ng/L (ref <18)

## 2021-08-08 NOTE — ED Provider Notes (Signed)
[facility] Provider Note   CSN: [id number] Arrival date & time: 08/08/21  1315     History Chief Complaint  Patient presents with   Chest Pain    Alicia Sanchez is a 60 y.o. female.   Chest Pain Associated symptoms: shortness of breath   Associated symptoms: no abdominal pain, no back pain and no weakness   Patient presents with chest pain.  Anterior chest.  Some tightness with mild shortness of breath.  Started last night.  Has had pain like this before.  Also some in the upper abdomen.  Had a stress test last week that was reassuring.  No fevers or chills.  No coughing.  No swelling her legs.  No diaphoresis.  States it is more on the right side of her chest.    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   Other spondylosis with radiculopathy, cervical region 12/21/2018   Chronic left shoulder pain 11/24/2018   Arthritis 11/24/2018   Mixed stress and urge urinary incontinence 08/12/2018   Actinic keratosis 07/17/2018   Chronic respiratory failure with hypoxia ([place]) 07/16/2018   Abnormal findings on diagnostic imaging of lung 06/18/2018   Lung nodules 08/21/2017   GOLD COPD II D 08/21/2017   Recurrent UTI 08/21/2017   Generalized anxiety disorder 03/22/2017   Moderate episode of recurrent major depressive disorder ([place]) 12/16/2016   Restless legs 12/16/2016   OAB (overactive bladder) 12/16/2016   Leukocytosis 11/19/2015   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     Family History  Problem Relation Age of Onset   COPD Mother    COPD Father     Diabetes type II Sister    Diabetes type II Brother    COPD Maternal Grandfather     Social History   Tobacco Use   Smoking status: Former    Packs/day: 0.50    Years: 42.00    Pack years: 21.00    Types: Cigarettes   Smokeless tobacco: Never  Vaping Use   Vaping Use: Never used  Substance Use Topics   Alcohol use: No   Drug use: No    Home Medications Prior to Admission medications   Medication Sig Start Date End Date Taking? Authorizing Provider  albuterol (VENTOLIN HFA) 108 (90 Base) MCG/ACT inhaler Inhale 2 puffs into the lungs every 6 (six) hours as needed for wheezing or shortness of breath. 01/27/21  Yes Mannam, Praveen, MD  alendronate (FOSAMAX) 70 MG tablet TAKE 1 TABLET EVERY 7 DAYS WITH A FULL GLASS OF WATER ON AN EMPTY STOMACH Patient taking differently: Take 70 mg by mouth once a week. 03/31/21  Yes Gottschalk, Leatrice Jewels M, DO  ALPRAZolam (XANAX) 0.5 MG tablet Take 0.5-1 tablets (0.25-0.5 mg total) by mouth 2 (two) times daily as needed for anxiety. 04/04/21  Yes Mozingo, Berdie Ogren, NP  aspirin EC 81 MG tablet Take 81 mg by mouth daily.   Yes [provider]  atorvastatin (LIPITOR) 40 MG tablet TAKE 1 TABLET EVERY DAY (NEED MD APPOINTMENT FOR  REFILLS) 06/30/21  Yes Branch, Alphonse Guild, MD  Budeson-Glycopyrrol-Formoterol (BREZTRI AEROSPHERE) 160-9-4.8 MCG/ACT AERO Inhale 2 puffs into the lungs 2 (two) times daily. 05/19/21  Yes Gottschalk, Ashly M, DO  busPIRone (BUSPAR) 30 MG tablet Take 1 tablet (30 mg total) by mouth 2 (two) times daily. 01/24/21  Yes Ronnie Doss M, DO  calcium carbonate (OS-CAL) 1250 (500 Ca) MG chewable tablet Chew 1 tablet (1,250 mg total) by mouth daily. 02/24/21  Yes Ronnie Doss M, DO  cetirizine (ZYRTEC) 10 MG tablet SMARTSIG:1 Tablet(s) By Mouth Daily 04/20/21  Yes [provider]  diltiazem (CARDIZEM) 30 MG tablet TAKE 1 TABLET TWICE DAILY AS NEEDED (NEED MD APPOINTMENT) 07/25/21  Yes Branch, Alphonse Guild, MD  DULoxetine  (CYMBALTA) 60 MG capsule TAKE 2 CAPSULES (120 MG TOTAL) EVERY DAY 04/03/21  Yes Gottschalk, Ashly M, DO  gabapentin (NEURONTIN) 300 MG capsule Take 1 capsule (300 mg total) by mouth at bedtime. 06/14/21  Yes Ronnie Doss M, DO  HYDROcodone-acetaminophen (NORCO) 10-325 MG tablet Take 1 tablet by mouth every 6 (six) hours as needed. 1/2 tablet   Yes [provider]  ipratropium-albuterol (DUONEB) 0.5-2.5 (3) MG/3ML SOLN Take 3 mLs by nebulization every 6 (six) hours as needed. 01/27/21  Yes Mannam, Praveen, MD  meclizine (ANTIVERT) 25 MG tablet TAKE 1 TABLET BY MOUTH THREE TIMES DAILY AS NEEDED FOR DIZZINESS 01/20/21  Yes Gottschalk, Ashly M, DO  meloxicam (MOBIC) 7.5 MG tablet TAKE 1 TABLET EVERY DAY 04/05/21  Yes Ronnie Doss M, DO  Multiple Vitamin (MULTIVITAMIN WITH MINERALS) TABS tablet Take 1 tablet by mouth daily.   Yes [provider]  Multiple Vitamins-Minerals (VITAMIN D3 COMPLETE PO) Take 1 tablet by mouth daily.   Yes [provider]  ramelteon (ROZEREM) 8 MG tablet Take 1 tablet (8 mg total) by mouth at bedtime. Insomnia refractory to Melatonin, Doxepin, Xanax 03/27/21  Yes Gottschalk, Ashly M, DO  rOPINIRole (REQUIP) 1 MG tablet TAKE 1 TO 4 TABLETS AT BEDTIME 04/05/21  Yes Gottschalk, Ashly M, DO  vitamin B-12 (CYANOCOBALAMIN) 100 MCG tablet Take 100 mcg by mouth daily.   Yes [provider]  vitamin C (ASCORBIC ACID) 250 MG tablet Take 500-1,000 mg by mouth daily.   Yes [provider]  EQ ALLERGY RELIEF, CETIRIZINE, 10 MG tablet Take 1 tablet by mouth once daily 11/28/20   Ronnie Doss M, DO  metoprolol tartrate (LOPRESSOR) 100 MG tablet Take 1 tablet (100 mg total) by mouth as directed. Take 1 & 1/2-2 hours before your ct scan Patient not taking: No sig reported 07/18/21   Verta Ellen., NP  nitroGLYCERIN (NITROSTAT) 0.4 MG SL tablet Place 1 tablet (0.4 mg total) under the tongue every 5 (five) minutes x 3 doses as needed for chest  pain (if o relief after 2nd dose, proceed to the ED for an evaluation or call 911). Patient not taking: No sig reported 07/18/21   Verta Ellen., NP  Respiratory Therapy Supplies (NEBULIZER/TUBING/MOUTHPIECE) KIT 1 Units 4 (four) times daily by Does not apply route. GIVE face mask and tubing for nebulizer 09/25/17   Terald Sleeper, PA-C    Allergies    Doxycycline  Review of Systems   Review of Systems  Constitutional:  Negative for appetite change.  HENT:  Negative for congestion.   Respiratory:  Positive for shortness of breath.   Cardiovascular:  Positive for chest pain.  Gastrointestinal:  Negative for abdominal pain.  Genitourinary:  Negative for dyspareunia.  Musculoskeletal:  Negative for back pain.  Neurological:  Negative for weakness.  Hematological:  Negative for adenopathy.  Psychiatric/Behavioral:  Negative for confusion.    Physical Exam Updated Vital Signs BP 97/62   Pulse 71   Temp 98.3 F (36.8 C) (Oral)   Resp 20   Ht _0  (1.626 m)   Wt 75.3 kg   SpO2 94%   BMI 28.51 kg/m   Physical Exam Vitals and nursing note reviewed.  Constitutional:      Appearance: She is well-developed.  HENT:     Head: Normocephalic.  Cardiovascular:     Rate and Rhythm: Normal rate and regular rhythm.  Pulmonary:     Breath sounds: No decreased breath sounds, wheezing, rhonchi or rales.  Abdominal:     General: There is no abdominal bruit.  Musculoskeletal:     Cervical back: Neck supple.     Right lower leg: No edema.     Left lower leg: No edema.  Skin:    General: Skin is warm.     Capillary Refill: Capillary refill takes less than 2 seconds.  Neurological:     Mental Status: She is alert.    ED Results / Procedures / Treatments   Labs (all labs ordered are listed, but only abnormal results are displayed) Labs Reviewed  BASIC METABOLIC PANEL - Abnormal; Notable for the following components:      Result Value   Glucose, Bld 101 (*)    BUN 5 (*)    All  other components within normal limits  CBC - Abnormal; Notable for the following components:   WBC 12.9 (*)    Hemoglobin 16.1 (*)    HCT 46.4 (*)    All other components within normal limits  TROPONIN I (HIGH SENSITIVITY)  TROPONIN I (HIGH SENSITIVITY)    EKG EKG Interpretation  Date/Time:  Tuesday August 08 2021 13:43:13 EDT Ventricular Rate:  94 PR Interval:  140 QRS Duration: 62 QT Interval:  348 QTC Calculation: 435 R Axis:   122 Text Interpretation: Normal sinus rhythm Right atrial enlargement Right axis deviation Pulmonary disease pattern Abnormal ECG since last tracing no significant change Confirmed by Daleen Bo [phone]) on 08/08/2021 2:01:16 PM  Radiology DG Chest 2 View  Result Date: 08/08/2021 CLINICAL DATA:  Central chest pain. EXAM: CHEST - 2 VIEW COMPARISON:  Chest x-ray dated February 09, 2020. FINDINGS: The heart size and mediastinal contours are within normal limits. Both lungs are clear. The visualized skeletal structures are unremarkable. IMPRESSION: No active cardiopulmonary disease. Electronically Signed   By: Titus Dubin M.D.   On: 08/08/2021 14:37    Procedures Procedures   Medications Ordered in ED Medications - No data to display  ED Course  I have reviewed the triage vital signs and the nursing notes.  Pertinent labs & imaging results that were available during my care of the patient were reviewed by me and considered in my medical decision making (see chart for details).    MDM Rules/Calculators/A&P                           Patient chest pain.  Anterior chest.  History of same.  Also upper abdomen.  Recent negative stress test within the last week.  Also previous work-up for pulmonary embolism that was negative.  Chest x-ray reassuring.  Troponin negative.  EKG reassuring.  Doubt cardiac cause.  Doubt pulmonary embolisms doubt pneumonia.  Well-appearing.  Discharge  home with outpatient follow-up. Final Clinical Impression(s) / ED  Diagnoses Final diagnoses:  Nonspecific chest pain    Rx / DC Orders ED Discharge Orders     None        Davonna Belling, MD 08/08/21 2340

## 2021-08-08 NOTE — ED Triage Notes (Signed)
Pt presents to ED with mid chest pressure worse with breathing started last night.

## 2021-08-14 ENCOUNTER — Other Ambulatory Visit: Payer: Self-pay | Admitting: Family Medicine

## 2021-08-14 DIAGNOSIS — J449 Chronic obstructive pulmonary disease, unspecified: Secondary | ICD-10-CM | POA: Diagnosis not present

## 2021-08-15 ENCOUNTER — Telehealth: Payer: Self-pay | Admitting: Family Medicine

## 2021-08-15 NOTE — Telephone Encounter (Signed)
Pt aware.

## 2021-08-15 NOTE — Telephone Encounter (Signed)
Yes, we ordered labs for her 8/31 visit but then she no showed.

## 2021-08-16 ENCOUNTER — Ambulatory Visit: Payer: Medicare HMO | Admitting: Family Medicine

## 2021-08-17 ENCOUNTER — Ambulatory Visit: Payer: Medicare HMO | Admitting: Pulmonary Disease

## 2021-08-21 ENCOUNTER — Encounter: Payer: Self-pay | Admitting: Family Medicine

## 2021-08-24 ENCOUNTER — Ambulatory Visit: Payer: Medicare HMO | Admitting: Family Medicine

## 2021-08-27 NOTE — Progress Notes (Signed)
Cardiology Office Note  Date: 08/28/2021   ID: Alicia Sanchez, DOB 22-Jul-1961, MRN [id number]  PCP:  Janora Norlander, DO  Cardiologist:  Carlyle Dolly, MD Electrophysiologist:  None   Chief Complaint: Follow-up 1 year  History of Present Illness: Alicia Sanchez is a 60 y.o. female with a history of CAD, palpitations, chest pain, COPD, anxiety.  PFT September 2018 with severe COPD.  She was followed by pulmonary Dr. Vaughan Browner.  On home O2 at 2 L.  Echocardiogram August 2019 with EF 60 to 45% normal diastolic function.   She was last seen by Dr. Harl Bowie on 01/07/2020 via telemedicine.  Her breathing had much improved and she had nearly quit smoking.  She was wearing O2 as needed.  She was having DOE with walking short distances with palpitations.  Could occur at rest and particularly at night lasting a few minutes.  She described breathing heavy occurring daily.  Palpitations could be better with Xanax.  She was started on Coreg less than a month and symptoms were better with beta-blocker.  She was taken first dose in a.m. and second dose as needed.  She admitted to high caffeine intake.  She was taking her as needed diltiazem at the time.  She described pressure/tightness mid to left chest at rest or with exertion.  Mild to moderate pain lasting about a minute.  Shortness of breath and feel anxious.  She was limited in her activity due to her COPD.  Her recent CT for lung cancer screening showed CAD.  A coronary CT was ordered to assess for existence of CAD.  If CAD was confirmed consider daily aspirin and possible statin.  Coronary CTA report per Dr. Harl Bowie on 02/09/2020 per showed only mild plaque in the arteries but no significant blockage.Marland Kitchen  She was to start aspirin 81 mg daily and atorvastatin 40 mg daily.  Recent presentation to [facility] ED on 08/08/2021 with complaints of chest pain, shortness of breath.  It was described as anteriorly with some tightness and mild shortness of  breath.  Had started the night before.  She described having pain like this before.  Described it as more right-sided.  Chest x-ray was reassuring.  Recent negative stress test.  Troponins were negative x2.  She is here today for 24-monthfollow-up.  She denies any further chest pain after recent visit [facility].  She states she has been under a lot of stress recently taking care of her father-in-law who is terminal and recently passed away.  She continues to smoke.  She states she has been having problems getting her sublingual nitroglycerin filled.  Currently denies any DOE or SOB with normal ADLs.  States she is compliant with her medical therapy.    Past Medical History:  Diagnosis Date   Anxiety    COPD (chronic obstructive [facility] disease) ([facility]    Osteoporosis     Past Surgical History:  Procedure Laterality Date   ABDOMINAL HYSTERECTOMY      Current Outpatient Medications  Medication Sig Dispense Refill   albuterol (VENTOLIN HFA) 108 (90 Base) MCG/ACT inhaler Inhale 2 puffs into the lungs every 6 (six) hours as needed for wheezing or shortness of breath. 3 each 2   alendronate (FOSAMAX) 70 MG tablet TAKE 1 TABLET EVERY 7 DAYS WITH A FULL GLASS OF WATER ON AN EMPTY STOMACH 12 tablet 0   ALPRAZolam (XANAX) 0.5 MG tablet Take 0.5-1 tablets (0.25-0.5 mg total) by mouth 2 (two) times daily  as needed for anxiety. 60 tablet 2   aspirin EC 81 MG tablet Take 81 mg by mouth daily.     atorvastatin (LIPITOR) 40 MG tablet TAKE 1 TABLET EVERY DAY (NEED MD APPOINTMENT FOR REFILLS) 60 tablet 0   Budeson-Glycopyrrol-Formoterol (BREZTRI AEROSPHERE) 160-9-4.8 MCG/ACT AERO Inhale 2 puffs into the lungs 2 (two) times daily. 10.7 g 11   busPIRone (BUSPAR) 30 MG tablet Take 1 tablet (30 mg total) by mouth 2 (two) times daily. 180 tablet 3   calcium carbonate (OS-CAL) 1250 (500 Ca) MG chewable tablet Chew 1 tablet (1,250 mg total) by mouth daily. 100 tablet 3   cetirizine (ZYRTEC) 10 MG tablet  SMARTSIG:1 Tablet(s) By Mouth Daily     diltiazem (CARDIZEM) 30 MG tablet TAKE 1 TABLET TWICE DAILY AS NEEDED (NEED MD APPOINTMENT) 60 tablet 4   DULoxetine (CYMBALTA) 60 MG capsule TAKE 2 CAPSULES (120 MG TOTAL) EVERY DAY 180 capsule 0   EQ ALLERGY RELIEF, CETIRIZINE, 10 MG tablet Take 1 tablet by mouth once daily 90 tablet 0   gabapentin (NEURONTIN) 300 MG capsule Take 1 capsule (300 mg total) by mouth at bedtime. 90 capsule 0   ipratropium-albuterol (DUONEB) 0.5-2.5 (3) MG/3ML SOLN Take 3 mLs by nebulization every 6 (six) hours as needed. 1080 mL 2   meclizine (ANTIVERT) 25 MG tablet TAKE 1 TABLET BY MOUTH THREE TIMES DAILY AS NEEDED FOR DIZZINESS 180 tablet 0   meloxicam (MOBIC) 7.5 MG tablet TAKE 1 TABLET EVERY DAY 90 tablet 0   metoprolol tartrate (LOPRESSOR) 100 MG tablet Take 1 tablet (100 mg total) by mouth as directed. Take 1 & 1/2-2 hours before your ct scan 1 tablet 0   Multiple Vitamin (MULTIVITAMIN WITH MINERALS) TABS tablet Take 1 tablet by mouth daily.     Multiple Vitamins-Minerals (VITAMIN D3 COMPLETE PO) Take 1 tablet by mouth daily.     ramelteon (ROZEREM) 8 MG tablet Take 1 tablet (8 mg total) by mouth at bedtime. Insomnia refractory to Melatonin, Doxepin, Xanax 90 tablet 3   Respiratory Therapy Supplies (NEBULIZER/TUBING/MOUTHPIECE) KIT 1 Units 4 (four) times daily by Does not apply route. GIVE face mask and tubing for nebulizer 1 each 11   rOPINIRole (REQUIP) 1 MG tablet TAKE 1 TO 4 TABLETS AT BEDTIME 360 tablet 0   vitamin B-12 (CYANOCOBALAMIN) 100 MCG tablet Take 100 mcg by mouth daily.     vitamin C (ASCORBIC ACID) 250 MG tablet Take 500-1,000 mg by mouth daily.     HYDROcodone-acetaminophen (NORCO) 10-325 MG tablet Take 1 tablet by mouth every 6 (six) hours as needed. 1/2 tablet (Patient not taking: Reported on 08/28/2021)     nitroGLYCERIN (NITROSTAT) 0.4 MG SL tablet Place 1 tablet (0.4 mg total) under the tongue every 5 (five) minutes x 3 doses as needed for chest pain  (if o relief after 2nd dose, proceed to the ED for an evaluation or call 911). 25 tablet 1   No current facility-administered medications for this visit.   Allergies:  Doxycycline   Social History: The patient  reports that she has quit smoking. Her smoking use included cigarettes. She has a 21.00 pack-year smoking history. She has never used smokeless tobacco. She reports that she does not drink alcohol and does not use drugs.   Family History: The patient's family history includes COPD in her father, maternal grandfather, and mother; Diabetes type II in her brother and sister.   ROS:  Please see the history of present illness. Otherwise, complete review  of systems is positive for none.  All other systems are reviewed and negative.   Physical Exam: VS:  BP 132/80   Pulse 99   Ht 5' 4.5" (1.638 m)   Wt 165 lb 9.6 oz (75.1 kg)   SpO2 97%   BMI 27.99 kg/m , BMI Body mass index is 27.99 kg/m.  Wt Readings from Last 3 Encounters:  08/28/21 165 lb 9.6 oz (75.1 kg)  08/08/21 166 lb 1.6 oz (75.3 kg)  07/31/21 165 lb (74.8 kg)    General: Patient appears comfortable at rest. Neck: Supple, no elevated JVP or carotid bruits, no thyromegaly. Lungs: Clear to auscultation, nonlabored breathing at rest. Cardiac: Regular rate and rhythm, no S3 or significant systolic murmur, no pericardial rub. Extremities: No pitting edema, distal pulses 2+. Skin: Warm and dry. Musculoskeletal: No kyphosis. Neuropsychiatric: Alert and oriented x3, affect grossly appropriate.  ECG: EKG normal sinus rhythm rate of 94, right atrial enlargement, rightward axis, pulmonary disease pattern.  Recent Labwork: 08/08/2021: BUN 5; Creatinine, Ser 0.82; Hemoglobin 16.1; Platelets 274; Potassium 3.8; Sodium 138     Component Value Date/Time   CHOL 199 01/06/2020 1239   TRIG 162 (H) 01/06/2020 1239   HDL 63 01/06/2020 1239   CHOLHDL 3.2 01/06/2020 1239   LDLCALC 108 (H) 01/06/2020 1239    Other Studies Reviewed  Today:    Stress test 08/03/2021   The study is low risk.   No ST deviation was noted. The ECG was negative for ischemia.   LV perfusion is abnormal. Defect 1: There is a small defect with mild reduction in uptake present in the mid inferior location(s) that is partially reversible. There is normal wall motion in the defect area. Consistent with mild ischemia.   Left ventricular function is normal. Nuclear stress EF: 80 %.   Small, partially reversible, mid inferior wall defect suggesting mild ischemic territory with vigorous LVEF 80%.  Low risk study.     CT chest for lung cancer screening 04/03/2021 IMPRESSION: 1. Lung-RADS 2, benign appearance or behavior. Continue annual screening with low-dose chest CT without contrast in 12 months. 2. Aortic Atherosclerosis (ICD10-I70.0) and Emphysema (ICD10-J43.9).   CT CHEST WO CONTRAST, 11/20/2018 10:59 AM   INDICATION: \ R91.8 Abnormal findings on diagnostic imaging of lung   COMPARISON: None   TECHNIQUE: Multislice axial images were obtained through the chest without administration of iodinated intravenous contrast material. Multi-planar reformatted images were generated for additional analysis. Nongated technique limits cardiac detail.       All CT scans at [facility] and [place] are performed using dose optimization techniques as appropriate to a performed exam, including but not limited to one or more of the following: automated exposure control, adjustment of the mA and/or kV according to patient size, use of iterative reconstruction technique. In addition, [facility] is participating in the [place] program which will further assist [place] in optimizing patient radiation exposure.     02/04/2020 coronary CT FINDINGS:   Thoracic inlet: Imaged portions of the thyroid are unremarkable.       Mediastinum/hila/axilla: No adenopathy.       Heart:  - A mild burden of calcified atherosclerotic  disease is evident within the coronary arterial distribution.  - Normal heart size. No pericardial effusion.       Vessels:  Aorta normal in caliber.   Central airways: Airway patent.       Lungs:  - Several 1-3 mm calcified and  noncalcified nodules.  - Moderate changes of centrilobular emphysema and diffuse mild bronchial wall thickening.  - Otherwise, both lungs are well expanded/aerated.       Pleura: No pleural effusions or pneumothorax.       Upper abdomen: Unremarkable.       Chest wall\MSK: Unremarkable.    FINDINGS: Non-cardiac: See separate report from [facility].   The pulmonary veins drain normally to the left atrium. No LA appendage thrombus.   Calcium Score: 45 Agatston units.   Coronary Arteries: Left dominant with no anomalies   LM: No plaque or stenosis.   LAD system: Mixed plaque proximal LAD, no significant stenosis.   Circumflex system: Large LCx provides left PDA. Mixed plaque mid LCx with no significant stenosis.   RCA system: Small, nondominant RCA.  No plaque or stenosis.   IMPRESSION: 1. Coronary artery calcium score 45 Agatston units. This places the patient in the 85th percentile for age and gender, suggesting high risk for future cardiac events.   2.  Nonobstructive mild CAD noted.         [place] chest CT Interface, Rad Results In - 11/20/2018  1:50 PM EST  CT CHEST WO CONTRAST, 11/20/2018 10:59 AM   INDICATION: \ R91.8 Abnormal findings on diagnostic imaging of lung   COMPARISON: None   TECHNIQUE: Multislice axial images were obtained through the chest without administration of iodinated intravenous contrast material. Multi-planar reformatted images were generated for additional analysis. Nongated technique limits cardiac detail.       All CT scans at [facility] and [place] are performed using dose optimization techniques as appropriate to a performed  exam, including but not limited to one or more of the following: automated exposure control, adjustment of the mA and/or kV according to patient size, use of iterative reconstruction technique. In addition, [facility] is participating in the [place] program which will further assist [place] in optimizing patient radiation exposure.    FINDINGS:   Thoracic inlet: Imaged portions of the thyroid are unremarkable.       Mediastinum/hila/axilla: No adenopathy.       Heart:  - A mild burden of calcified atherosclerotic disease is evident within the coronary arterial distribution.  - Normal heart size. No pericardial effusion.       Vessels:  Aorta normal in caliber.   Central airways: Airway patent.       Lungs:  - Several 1-3 mm calcified and noncalcified nodules.  - Moderate changes of centrilobular emphysema and diffuse mild bronchial wall thickening.  - Otherwise, both lungs are well expanded/aerated.       Pleura: No pleural effusions or pneumothorax.       Upper abdomen: Unremarkable.       Chest wall\MSK: Unremarkable.        CONCLUSION:    1. I see no concerning pulmonary nodules. Several 1-3 mm calcified and noncalcified nodules likely relate to benign disease. If the patient is eligible, consider participation in a lung cancer screening program which utilizes annual low dose CT imaging of the chest.   2. Moderate changes of centrilobular emphysema and diffuse mild bronchial wall thickening. Specimen Collected: -- Last Resulted: --  Date: 11/20/18       Jan 2017 Study Conclusions   - Left ventricle: The cavity size was normal. Systolic function was   vigorous. The estimated ejection fraction was in the range of 65%   to 70%. Wall  motion was normal; there were no regional wall   motion abnormalities. Doppler parameters are consistent with   abnormal left ventricular relaxation (grade 1 diastolic   dysfunction). There was no evidence of elevated ventricular   filling  pressure by Doppler parameters. - Aortic valve: There was no regurgitation. - Aortic root: The aortic root was normal in size. - Mitral valve: Structurally normal valve. There was mild   regurgitation. - Right ventricle: The cavity size was normal. Wall thickness was   normal. Systolic function was normal. - Right atrium: The atrium was normal in size. - Tricuspid valve: There was mild regurgitation. - Pulmonic valve: There was no regurgitation. - Pulmonary arteries: Systolic pressure was within the normal   range. - Inferior vena cava: The vessel was normal in size. - Pericardium, extracardiac: There was no pericardial effusion.     06/2018 echo Study Conclusions   - Left ventricle: The cavity size was normal. Wall thickness was   normal. Systolic function was normal. The estimated ejection   fraction was in the range of 60% to 65%. Wall motion was normal;   there were no regional wall motion abnormalities. Left   ventricular diastolic function parameters were normal. - Aortic valve: Mildly calcified annulus. Probably trileaflet. - Mitral valve: Mildly calcified annulus. There was trivial   regurgitation. - Right ventricle: The cavity size was mildly dilated. - Right atrium: Central venous pressure (est): 3 mm Hg. - Atrial septum: No defect or patent foramen ovale was identified. - Tricuspid valve: There was trivial regurgitation. - Pericardium, extracardiac: A prominent pericardial fat pad was   present.   06/2018 event monitor 7 day event monitor Min HR 76, Max HR 143, Avg HR 93 Symptoms correlate with sinus rhythm and sinus tachycardia No significant arrhythmias Assessment and Plan:  1. Chest pain, unspecified type   2. CAD in native artery   3. Chronic obstructive pulmonary disease, unspecified COPD type ([place])     1. Chest pain of uncertain etiology No recent chest pain since recent presentation to the ER on 08/08/2021.  Her recent Lexiscan stress test was considered  low risk.  She had a small partially reversible mid inferior wall defect suggesting mild ischemic territory with vigorous LVEF of 80%.  Low risk study.  Continue sublingual nitroglycerin as needed.  Continue aspirin 81 mg daily.   2. CAD in native artery Previous CTA On 02/04/2020 with CAC score of 45.  Left main with no plaque or stenosis.  LAD mixed plaque proximal LAD, no significant stenosis.  Circumflex system, large LCx provides left PDA.  Mixed plaque mid LCx with no significant stenosis.  RCA system small, nondominant RCA, no plaque or stenosis.  CAC score 45 placed patient in 70 percentile for age and gender suggesting high risk for future cardiac events.  Recent Lexiscan stress was negative for significant ischemia.  See report above.  Continue aspirin 81 mg daily, atorvastatin 40 mg daily.  Continue sublingual nitroglycerin as needed.  3. Chronic obstructive pulmonary disease, unspecified COPD type ([place]) Severe COPD by PFTs in 2018.  She uses as needed oxygen at home.  She continues to smoke.    Recent significant stress in her life causing her to smoke more.  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  She states at 1 point she was down to 2 cigarettes/day.  Encouraged cessation.  Medication Adjustments/Labs and Tests Ordered: Current medicines are reviewed at length with the patient today.  Concerns  regarding medicines are outlined above.   Disposition: Follow-up with Dr. Harl Bowie or APP 6 months  Signed, Alicia July, NP 08/28/2021 9:55 AM    [facility] at [place], [place], [place] [address] Phone: [phone]; Fax: [phone]

## 2021-08-28 ENCOUNTER — Ambulatory Visit (INDEPENDENT_AMBULATORY_CARE_PROVIDER_SITE_OTHER): Payer: Medicare HMO | Admitting: Family Medicine

## 2021-08-28 ENCOUNTER — Encounter: Payer: Self-pay | Admitting: Family Medicine

## 2021-08-28 VITALS — BP 132/80 | HR 99 | Ht 64.5 in | Wt 165.6 lb

## 2021-08-28 DIAGNOSIS — R079 Chest pain, unspecified: Secondary | ICD-10-CM

## 2021-08-28 DIAGNOSIS — I2510 Atherosclerotic heart disease of native coronary artery without angina pectoris: Secondary | ICD-10-CM

## 2021-08-28 DIAGNOSIS — J449 Chronic obstructive pulmonary disease, unspecified: Secondary | ICD-10-CM

## 2021-08-28 MED ORDER — NITROGLYCERIN 0.4 MG SL SUBL: 0.4000 mg | SUBLINGUAL_TABLET | SUBLINGUAL | 1 refills | Status: DC | PRN

## 2021-08-28 NOTE — Patient Instructions (Signed)
Medication Instructions:  Your physician recommends that you continue on your current medications as directed. Please refer to the Current Medication list given to you today.  *If you need a refill on your cardiac medications before your next appointment, please call your pharmacy*   Lab Work: None If you have labs (blood work) drawn today and your tests are completely normal, you will receive your results only by: MyChart Message (if you have MyChart) OR A paper copy in the mail If you have any lab test that is abnormal or we need to change your treatment, we will call you to review the results.   Testing/Procedures: None   Follow-Up: At [facility], you and your health needs are our priority.  As part of our continuing mission to provide you with exceptional heart care, we have created designated Provider Care Teams.  These Care Teams include your primary Cardiologist (physician) and Advanced Practice Providers (APPs -  Physician Assistants and Nurse Practitioners) who all work together to provide you with the care you need, when you need it.  We recommend signing up for the patient portal called "MyChart".  Sign up information is provided on this After Visit Summary.  MyChart is used to connect with patients for Virtual Visits (Telemedicine).  Patients are able to view lab/test results, encounter notes, upcoming appointments, etc.  Non-urgent messages can be sent to your provider as well.   To learn more about what you can do with MyChart, go to [email or web address].    Your next appointment:   6 month(s)  The format for your next appointment:   In Person  Provider:   Andy Quinn, NP   Other Instructions    

## 2021-08-30 ENCOUNTER — Ambulatory Visit (INDEPENDENT_AMBULATORY_CARE_PROVIDER_SITE_OTHER): Payer: Medicare HMO | Admitting: Pulmonary Disease

## 2021-08-30 ENCOUNTER — Other Ambulatory Visit: Payer: Self-pay

## 2021-08-30 ENCOUNTER — Encounter: Payer: Self-pay | Admitting: Pulmonary Disease

## 2021-08-30 ENCOUNTER — Other Ambulatory Visit: Payer: Self-pay | Admitting: Adult Health

## 2021-08-30 VITALS — BP 122/78 | HR 108 | Temp 98.4°F | Ht 64.5 in | Wt 163.2 lb

## 2021-08-30 DIAGNOSIS — J449 Chronic obstructive pulmonary disease, unspecified: Secondary | ICD-10-CM

## 2021-08-30 DIAGNOSIS — Z23 Encounter for immunization: Secondary | ICD-10-CM | POA: Diagnosis not present

## 2021-08-30 DIAGNOSIS — F17210 Nicotine dependence, cigarettes, uncomplicated: Secondary | ICD-10-CM | POA: Diagnosis not present

## 2021-08-30 DIAGNOSIS — F411 Generalized anxiety disorder: Secondary | ICD-10-CM

## 2021-08-30 DIAGNOSIS — J439 Emphysema, unspecified: Secondary | ICD-10-CM

## 2021-08-30 NOTE — Patient Instructions (Signed)
Continue the breztri We will give a flu vaccine today Continue to work on smoking cessation I will make a referral to the cessation clinic  Follow-up in 6 months

## 2021-08-30 NOTE — Progress Notes (Signed)
Alicia Sanchez    [id number]    06-Nov-1961  Primary Care Physician:Gottschalk, Koleen Distance, DO  Referring Physician: Janora Norlander, DO [place],  [place] [address]  Chief complaint:   Follow-up for COPD  HPI: Alicia Sanchez is a 60 year old active smoker with history of anxiety, depression, COPD.  Initially treated with Memory Dance and Incruse which was consolidated to Trelegy inhaler in 2018  She has daily symptoms of snoring. She does sleep study 4 years ago but does not know the result of this test. She is a diagnosis of restless leg syndrome and is on requip. She did not have the repeat sleep study done as it was not covered by insurance.  Pets:Dog which lives outside the house. No pets, exotic pets. Occupation:Customer sevice rep. previously worked in [employer] Exposures: His current dust exposure and previous exposure to cotton fiber in her line of work Smoking history: 35-pack-year smoking history. Continues to smoke 1 pack per day She quit smoking in 2021 but unfortunately started again due to family stresses  Interim history: Trelegy changed to breztri by her primary care due to insurance coverage issues She quit smoking for a while but restarted again due to the stress at home as her father-in-law passed away recently  Outpatient Encounter Medications as of 08/30/2021  Medication Sig   albuterol (VENTOLIN HFA) 108 (90 Base) MCG/ACT inhaler Inhale 2 puffs into the lungs every 6 (six) hours as needed for wheezing or shortness of breath.   alendronate (FOSAMAX) 70 MG tablet TAKE 1 TABLET EVERY 7 DAYS WITH A FULL GLASS OF WATER ON AN EMPTY STOMACH   ALPRAZolam (XANAX) 0.5 MG tablet Take 0.5-1 tablets (0.25-0.5 mg total) by mouth 2 (two) times daily as needed for anxiety.   aspirin EC 81 MG tablet Take 81 mg by mouth daily.   atorvastatin (LIPITOR) 40 MG tablet TAKE 1 TABLET EVERY DAY (NEED MD APPOINTMENT FOR REFILLS)   Budeson-Glycopyrrol-Formoterol (BREZTRI  AEROSPHERE) 160-9-4.8 MCG/ACT AERO Inhale 2 puffs into the lungs 2 (two) times daily.   busPIRone (BUSPAR) 30 MG tablet Take 1 tablet (30 mg total) by mouth 2 (two) times daily.   calcium carbonate (OS-CAL) 1250 (500 Ca) MG chewable tablet Chew 1 tablet (1,250 mg total) by mouth daily.   cetirizine (ZYRTEC) 10 MG tablet SMARTSIG:1 Tablet(s) By Mouth Daily   diltiazem (CARDIZEM) 30 MG tablet TAKE 1 TABLET TWICE DAILY AS NEEDED (NEED MD APPOINTMENT)   DULoxetine (CYMBALTA) 60 MG capsule TAKE 2 CAPSULES (120 MG TOTAL) EVERY DAY   EQ ALLERGY RELIEF, CETIRIZINE, 10 MG tablet Take 1 tablet by mouth once daily   gabapentin (NEURONTIN) 300 MG capsule Take 1 capsule (300 mg total) by mouth at bedtime.   HYDROcodone-acetaminophen (NORCO) 10-325 MG tablet Take 1 tablet by mouth every 6 (six) hours as needed. 1/2 tablet   ipratropium-albuterol (DUONEB) 0.5-2.5 (3) MG/3ML SOLN Take 3 mLs by nebulization every 6 (six) hours as needed.   meclizine (ANTIVERT) 25 MG tablet TAKE 1 TABLET BY MOUTH THREE TIMES DAILY AS NEEDED FOR DIZZINESS   meloxicam (MOBIC) 7.5 MG tablet TAKE 1 TABLET EVERY DAY   Multiple Vitamin (MULTIVITAMIN WITH MINERALS) TABS tablet Take 1 tablet by mouth daily.   Multiple Vitamins-Minerals (VITAMIN D3 COMPLETE PO) Take 1 tablet by mouth daily.   nitroGLYCERIN (NITROSTAT) 0.4 MG SL tablet Place 1 tablet (0.4 mg total) under the tongue every 5 (five) minutes x 3 doses as needed for  chest pain (if o relief after 2nd dose, proceed to the ED for an evaluation or call 911).   ramelteon (ROZEREM) 8 MG tablet Take 1 tablet (8 mg total) by mouth at bedtime. Insomnia refractory to Melatonin, Doxepin, Xanax   Respiratory Therapy Supplies (NEBULIZER/TUBING/MOUTHPIECE) KIT 1 Units 4 (four) times daily by Does not apply route. GIVE face mask and tubing for nebulizer   rOPINIRole (REQUIP) 1 MG tablet TAKE 1 TO 4 TABLETS AT BEDTIME   vitamin B-12 (CYANOCOBALAMIN) 100 MCG tablet Take 100 mcg by mouth daily.    vitamin C (ASCORBIC ACID) 250 MG tablet Take 500-1,000 mg by mouth daily.   No facility-administered encounter medications on file as of 08/30/2021.   Physical Exam: Blood pressure 122/78, pulse (!) 108, temperature 98.4 F (36.9 C), temperature source Oral, height 5' 4.5" (1.638 m), weight 163 lb 3.2 oz (74 kg), SpO2 94 %. Gen:      No acute distress HEENT:  EOMI, sclera anicteric Neck:     No masses; no thyromegaly Lungs:    Clear to auscultation bilaterally; normal respiratory effort CV:         Regular rate and rhythm; no murmurs Abd:      + bowel sounds; soft, non-tender; no palpable masses, no distension Ext:    No edema; adequate peripheral perfusion Skin:      Warm and dry; no rash Neuro: alert and oriented x 3 Psych: normal mood and affect   Data Reviewed: Imaging Screening CT chest 05/08/17-centrilobular emphysema, calcified granuloma, tiny subcentimeter pulmonary nodules. CTA 05/21/2018- emphysema, scattered pulmonary nodule.  4 mm right lower lung nodule is new. I have reviewed the images personally  CTA [facility]) 03/11/2019-no pulmonary embolism.  No active cardiopulmonary disease.  Coronary artery calcification No images available in PACS  Screening CT 12/17/2019-emphysema, tiny calcified pulmonary nodules  CTA 02/09/2020-no PE, lungs are clear with emphysema.  Screening CT 04/03/2021-emphysema, stable pulmonary nodules. I have reviewed the images personally.  PFTs  07/24/17 FVC 2.54 (74%], FEV1 1.55 (58%], F/F 61, TLC 103%, DLCO 64% Moderate obstruction and diffusion impairment with air trapping  08/18/2018 FVC 2.26 [70%], FEV1 1.23 [49%], F/F 54, TLC 5.98 [123%], DLCO 12.55 [56%] Severe obstructive airway disease.  Worse compared to 2018  Labs CBC/1/17-absolute eosinophil count 0 Alpha-1 antitrypsin 04/25/2017-136, PI MM  Assessment:  COPD, chronic bronchitis Currently on breztri, albuterol Nebs as needed  Active smoker Unfortunately started smoking  again We discussed smoking cessation and she wants to attempt again to quit Make referral to cessation clinic Time spent counseling-5 minutes.  Reassess at return visit.  Continue low-dose screening CT   Restless leg syndrome, suspected sleep apnea On requip for restless leg syndrome.  Sleep study not done as it is not covered by insurance.  Plan/Recommendations: - Continue breztri duo nebs as needed - Low dose screening CT of the chest - Smoking cessation  Marshell Garfinkel MD [facility] 08/30/2021, 3:31 PM  CC: Janora Norlander, DO

## 2021-08-30 NOTE — Addendum Note (Signed)
Addended by: Jacquiline Doe on: 08/30/2021 04:12 PM   Modules accepted: Orders

## 2021-08-31 ENCOUNTER — Other Ambulatory Visit: Payer: Self-pay | Admitting: Family Medicine

## 2021-08-31 DIAGNOSIS — M1990 Unspecified osteoarthritis, unspecified site: Secondary | ICD-10-CM

## 2021-08-31 DIAGNOSIS — G2581 Restless legs syndrome: Secondary | ICD-10-CM

## 2021-09-01 NOTE — Telephone Encounter (Signed)
Pt no showed on 8/21 and has not been seen since may.Last filled 8/26

## 2021-09-01 NOTE — Telephone Encounter (Signed)
Please schedule appt

## 2021-09-05 ENCOUNTER — Ambulatory Visit: Payer: Medicare HMO | Admitting: Cardiology

## 2021-09-05 ENCOUNTER — Other Ambulatory Visit: Payer: Self-pay

## 2021-09-05 DIAGNOSIS — F411 Generalized anxiety disorder: Secondary | ICD-10-CM

## 2021-09-05 MED ORDER — ALPRAZOLAM 0.5 MG PO TABS: 0.2500 mg | ORAL_TABLET | Freq: Two times a day (BID) | ORAL | 0 refills | Status: DC | PRN

## 2021-09-05 NOTE — Telephone Encounter (Signed)
Appt 10/24 

## 2021-09-05 NOTE — Telephone Encounter (Signed)
Called and LVM

## 2021-09-06 ENCOUNTER — Other Ambulatory Visit: Payer: Self-pay | Admitting: Family Medicine

## 2021-09-06 DIAGNOSIS — M79604 Pain in right leg: Secondary | ICD-10-CM

## 2021-09-06 DIAGNOSIS — J302 Other seasonal allergic rhinitis: Secondary | ICD-10-CM

## 2021-09-06 DIAGNOSIS — M79605 Pain in left leg: Secondary | ICD-10-CM

## 2021-09-06 DIAGNOSIS — M5136 Other intervertebral disc degeneration, lumbar region: Secondary | ICD-10-CM

## 2021-09-11 ENCOUNTER — Telehealth (INDEPENDENT_AMBULATORY_CARE_PROVIDER_SITE_OTHER): Payer: Medicare HMO | Admitting: Adult Health

## 2021-09-11 ENCOUNTER — Encounter: Payer: Self-pay | Admitting: Adult Health

## 2021-09-11 DIAGNOSIS — F410 Panic disorder [episodic paroxysmal anxiety] without agoraphobia: Secondary | ICD-10-CM | POA: Diagnosis not present

## 2021-09-11 DIAGNOSIS — F411 Generalized anxiety disorder: Secondary | ICD-10-CM | POA: Diagnosis not present

## 2021-09-11 MED ORDER — CLONAZEPAM 0.5 MG PO TABS: 0.5000 mg | ORAL_TABLET | Freq: Two times a day (BID) | ORAL | 2 refills | Status: DC | PRN

## 2021-09-11 NOTE — Progress Notes (Signed)
Alicia Sanchez [id number] [birth year] 60 y.o.  Virtual Visit via Video Note  I connected with pt @ on 09/11/21 at  3:40 PM EDT by a video enabled telemedicine application and verified that I am speaking with the correct person using two identifiers.   I discussed the limitations of evaluation and management by telemedicine and the availability of in person appointments. The patient expressed understanding and agreed to proceed.  I discussed the assessment and treatment plan with the patient. The patient was provided an opportunity to ask questions and all were answered. The patient agreed with the plan and demonstrated an understanding of the instructions.   The patient was advised to call back or se10ek an in-person evaluation if the symptoms worsen or if the condition fails to improve as anticipated.  I provided 10 minutes of non-face-to-face time during this encounter.  The patient was located at home.  The provider was located at [place].   Aloha Gell, NP   Subjective:   Patient ID:  Alicia Sanchez is a 60 y.o. (DOB 1960/12/25) female.  Chief Complaint: No chief complaint on file.   HPI Alicia Sanchez presents for follow-up of GAD and panic attacks.  Describes mood today as "not the best". Pleasant. Tearful at times. Mood symptoms - reports depression, anxiety, and irritability. Reports panic attacks - "more than normal". Recently lost father-in-law at the end of September - grieving his loss. Was his primary care giver for the past year and 1/2. Feels like the Xanax is not working as well as it was. She and husband continue to live apart, but he visits her every day. Stating "it's been hard for me". Continues to see PCP for psychotropic medications. Varying interest and motivation. Taking medications as prescribed.  Energy levels low. Active, does not have a regular exercise routine.   Enjoys some usual interests and activities. Married. Lives with husband (61rd)  for 17 years. Has two daughters and 2 step daughters. Has 3 sisters and 2 brothers. Mother living - talks to her. Spending time with family. Appetite adequate. Weight gain - 172 pounds. Sleeps better some nights than others - staying up most of the night. Averages 4 hours - "if I'm lucky". Denies daytime napping.  Focus and concentration difficulties. Completing tasks. Managing aspects of household. Disabled since 2020.  Denies SI or HI.  Denies AH. Hears music coming from the walls - "all the time".  Previous medication trials: Paxil, Wellbutrin, Depakote, others   Review of Systems:  Review of Systems  Musculoskeletal:  Negative for gait problem.  Neurological:  Negative for tremors.  Psychiatric/Behavioral:         Please refer to HPI   Medications: I have reviewed the patient's current medications.  Current Outpatient Medications  Medication Sig Dispense Refill   albuterol (VENTOLIN HFA) 108 (90 Base) MCG/ACT inhaler Inhale 2 puffs into the lungs every 6 (six) hours as needed for wheezing or shortness of breath. 3 each 2   alendronate (FOSAMAX) 70 MG tablet TAKE 1 TABLET EVERY 7 DAYS WITH A FULL GLASS OF WATER ON AN EMPTY STOMACH 12 tablet 0   ALPRAZolam (XANAX) 0.5 MG tablet Take 0.5-1 tablets (0.25-0.5 mg total) by mouth 2 (two) times daily as needed for anxiety. 60 tablet 0   aspirin EC 81 MG tablet Take 81 mg by mouth daily.     atorvastatin (LIPITOR) 40 MG tablet TAKE 1 TABLET EVERY DAY (NEED MD APPOINTMENT FOR REFILLS) 60 tablet 0   Budeson-Glycopyrrol-Formoterol (BREZTRI  AEROSPHERE) 160-9-4.8 MCG/ACT AERO Inhale 2 puffs into the lungs 2 (two) times daily. 10.7 g 11   busPIRone (BUSPAR) 30 MG tablet Take 1 tablet (30 mg total) by mouth 2 (two) times daily. 180 tablet 3   calcium carbonate (OS-CAL) 1250 (500 Ca) MG chewable tablet Chew 1 tablet (1,250 mg total) by mouth daily. 100 tablet 3   diltiazem (CARDIZEM) 30 MG tablet TAKE 1 TABLET TWICE DAILY AS NEEDED (NEED MD  APPOINTMENT) 60 tablet 4   DULoxetine (CYMBALTA) 60 MG capsule TAKE 2 CAPSULES (120 MG TOTAL) EVERY DAY 180 capsule 0   EQ ALLERGY RELIEF, CETIRIZINE, 10 MG tablet Take 1 tablet by mouth once daily 90 tablet 1   gabapentin (NEURONTIN) 300 MG capsule Take 1 capsule by mouth at bedtime 90 capsule 1   HYDROcodone-acetaminophen (NORCO) 10-325 MG tablet Take 1 tablet by mouth every 6 (six) hours as needed. 1/2 tablet     ipratropium-albuterol (DUONEB) 0.5-2.5 (3) MG/3ML SOLN Take 3 mLs by nebulization every 6 (six) hours as needed. 1080 mL 2   meclizine (ANTIVERT) 25 MG tablet TAKE 1 TABLET BY MOUTH THREE TIMES DAILY AS NEEDED FOR DIZZINESS 180 tablet 0   meloxicam (MOBIC) 7.5 MG tablet TAKE 1 TABLET EVERY DAY 90 tablet 0   Multiple Vitamin (MULTIVITAMIN WITH MINERALS) TABS tablet Take 1 tablet by mouth daily.     Multiple Vitamins-Minerals (VITAMIN D3 COMPLETE PO) Take 1 tablet by mouth daily.     nitroGLYCERIN (NITROSTAT) 0.4 MG SL tablet Place 1 tablet (0.4 mg total) under the tongue every 5 (five) minutes x 3 doses as needed for chest pain (if o relief after 2nd dose, proceed to the ED for an evaluation or call 911). 25 tablet 1   ramelteon (ROZEREM) 8 MG tablet Take 1 tablet (8 mg total) by mouth at bedtime. Insomnia refractory to Melatonin, Doxepin, Xanax 90 tablet 3   Respiratory Therapy Supplies (NEBULIZER/TUBING/MOUTHPIECE) KIT 1 Units 4 (four) times daily by Does not apply route. GIVE face mask and tubing for nebulizer 1 each 11   rOPINIRole (REQUIP) 1 MG tablet TAKE 1 TO 4 TABLETS AT BEDTIME 360 tablet 0   vitamin B-12 (CYANOCOBALAMIN) 100 MCG tablet Take 100 mcg by mouth daily.     vitamin C (ASCORBIC ACID) 250 MG tablet Take 500-1,000 mg by mouth daily.     No current facility-administered medications for this visit.    Medication Side Effects: None  Allergies:  Allergies  Allergen Reactions   Doxycycline     Chest pain    Past Medical History:  Diagnosis Date   Anxiety     COPD (chronic obstructive pulmonary disease) ([place])    Osteoporosis     Family History  Problem Relation Age of Onset   COPD Mother    COPD Father    Diabetes type II Sister    Diabetes type II Brother    COPD Maternal Grandfather     Social History   Socioeconomic History   Marital status: Married    Spouse name: Not on file   Number of children: Not on file   Years of education: Not on file   Highest education level: Not on file  Occupational History   Not on file  Tobacco Use   Smoking status: Former    Packs/day: 0.50    Years: 42.00    Pack years: 21.00    Types: Cigarettes   Smokeless tobacco: Never   Tobacco comments:    1/2 pack-  1pack a day  Vaping Use   Vaping Use: Never used  Substance and Sexual Activity   Alcohol use: No   Drug use: No   Sexual activity: Yes    Birth control/protection: Surgical  Other Topics Concern   Not on file  Social History Narrative   Not on file   Social Determinants of Health   Financial Resource Strain: Not on file  Food Insecurity: Not on file  Transportation Needs: Not on file  Physical Activity: Not on file  Stress: Not on file  Social Connections: Not on file  Intimate Partner Violence: Not on file    Past Medical History, Surgical history, Social history, and Family history were reviewed and updated as appropriate.   Please see review of systems for further details on the patient's review from today.   Objective:   Physical Exam:  There were no vitals taken for this visit.  Physical Exam Constitutional:      General: She is not in acute distress. Musculoskeletal:        General: No deformity.  Neurological:     Mental Status: She is alert and oriented to person, place, and time.     Coordination: Coordination normal.  Psychiatric:        Attention and Perception: Attention and perception normal. She does not perceive auditory or visual hallucinations.        Mood and Affect: Mood normal. Mood is not  anxious or depressed. Affect is not labile, blunt, angry or inappropriate.        Speech: Speech normal.        Behavior: Behavior normal.        Thought Content: Thought content normal. Thought content is not paranoid or delusional. Thought content does not include homicidal or suicidal ideation. Thought content does not include homicidal or suicidal plan.        Cognition and Memory: Cognition and memory normal.        Judgment: Judgment normal.     Comments: Insight intact    Lab Review:     Component Value Date/Time   NA 138 08/08/2021 1355   NA 143 06/03/2020 1505   K 3.8 08/08/2021 1355   CL 105 08/08/2021 1355   CO2 25 08/08/2021 1355   GLUCOSE 101 (H) 08/08/2021 1355   BUN 5 (L) 08/08/2021 1355   BUN 9 06/03/2020 1505   CREATININE 0.82 08/08/2021 1355   CALCIUM 9.2 08/08/2021 1355   PROT 6.3 06/03/2020 1505   ALBUMIN 4.3 06/03/2020 1505   AST 23 06/03/2020 1505   ALT 16 06/03/2020 1505   ALKPHOS 76 06/03/2020 1505   BILITOT 0.2 06/03/2020 1505   GFRNONAA >60 08/08/2021 1355   GFRAA 91 06/03/2020 1505       Component Value Date/Time   WBC 12.9 (H) 08/08/2021 1355   RBC 5.04 08/08/2021 1355   HGB 16.1 (H) 08/08/2021 1355   HGB 14.9 06/03/2020 1505   HCT 46.4 (H) 08/08/2021 1355   HCT 44.5 06/03/2020 1505   PLT 274 08/08/2021 1355   PLT 318 06/03/2020 1505   MCV 92.1 08/08/2021 1355   MCV 89 06/03/2020 1505   MCH 31.9 08/08/2021 1355   MCHC 34.7 08/08/2021 1355   RDW 12.3 08/08/2021 1355   RDW 12.3 06/03/2020 1505   LYMPHSABS 2.8 01/06/2020 1239   MONOABS 0.5 05/25/2018 0602   EOSABS 0.1 01/06/2020 1239   BASOSABS 0.0 01/06/2020 1239    No results found for: POCLITH, LITHIUM  No results found for: PHENYTOIN, PHENOBARB, VALPROATE, CBMZ   .res Assessment: Plan:     Plan:  PDMP reviewed  D/C Xanax 0.57m BID Add Clonazepam 0.538mBID  PCP prescribes - Cymbalta 12085mvery morning Buspar 65m7mD Doxepin 100mg24mbedtime  Refer to  therapist  RTC 3 months  Patient advised to contact office with any questions, adverse effects, or acute worsening in signs and symptoms.  Discussed potential benefits, risk, and side effects of benzodiazepines to include potential risk of tolerance and dependence, as well as possible drowsiness.  Advised patient not to drive if experiencing drowsiness and to take lowest possible effective dose to minimize risk of dependence and tolerance.  There are no diagnoses linked to this encounter.   Please see After Visit Summary for patient specific instructions.  Future Appointments  Date Time Provider [place]15/2022 10:00 AM [facility] PHARMACIST [facility] None  10/25/2021  3:15 PM GottsJanora Norlander[facility] None    No orders of the defined types were placed in this encounter.     -------------------------------

## 2021-09-13 DIAGNOSIS — J449 Chronic obstructive pulmonary disease, unspecified: Secondary | ICD-10-CM | POA: Diagnosis not present

## 2021-09-21 ENCOUNTER — Other Ambulatory Visit: Payer: Self-pay

## 2021-09-21 ENCOUNTER — Telehealth: Payer: Self-pay | Admitting: Adult Health

## 2021-09-21 DIAGNOSIS — F411 Generalized anxiety disorder: Secondary | ICD-10-CM

## 2021-09-21 MED ORDER — ALPRAZOLAM 0.5 MG PO TABS: 0.2500 mg | ORAL_TABLET | Freq: Three times a day (TID) | ORAL | 0 refills | Status: DC | PRN

## 2021-09-21 NOTE — Telephone Encounter (Signed)
Ok to pend Xanax.

## 2021-09-21 NOTE — Telephone Encounter (Signed)
She will need a new rx for xanax.Should she bring the klonopin back here or to the pharmacy to destroy?

## 2021-09-21 NOTE — Telephone Encounter (Signed)
Ok we can go back to the Xanax 0.5mg  BID to TID. Also she can call insurance company to help find a therapist since we are booked out right now.

## 2021-09-21 NOTE — Telephone Encounter (Signed)
Pt stated you switched her to klonopin at her last visit and it has only made her anxiety worse.She would like to go back on xanax at a higher dose

## 2021-09-21 NOTE — Telephone Encounter (Signed)
Pt called wanting to be seen asap.  [facility] Coaster had a spot at  12pm opening today. I called pt back to get more info on what the issue was to see if the appt was really needed or if she was having med issues.  She was weepy.  She said to advise Almira Coaster that the med she is taking doesn't seem to be working at all.  She also wanted a therapist and can't find one in her area.  She wanted to know if Almira Coaster can help her find one since the therapists here were booked far out.  Clinical, Pls call her back to see if you help with med issue.  Gina, pls advise if you know of another resource she should try for therapy.  She is Scientist, research (physical sciences).  No upcoming appt scheduled

## 2021-10-03 ENCOUNTER — Telehealth: Payer: Self-pay

## 2021-10-03 ENCOUNTER — Other Ambulatory Visit: Payer: Self-pay | Admitting: Cardiology

## 2021-10-06 ENCOUNTER — Telehealth: Payer: Self-pay | Admitting: Cardiology

## 2021-10-06 MED ORDER — ATORVASTATIN CALCIUM 40 MG PO TABS: ORAL_TABLET | ORAL | 0 refills | Status: DC

## 2021-10-06 NOTE — Telephone Encounter (Signed)
Complete

## 2021-10-06 NOTE — Telephone Encounter (Signed)
  *  STAT* If patient is at the pharmacy, call can be transferred to refill team.   1. Which medications need to be refilled? (please list name of each medication and dose if known) atorvastatin (LIPITOR) 40 MG tablet  2. Which pharmacy/location (including street and city if local pharmacy) is medication to be sent to? [facility] [address] [place] - [address]  3. Do they need a 30 day or 90 day supply? 30 days  [facility] pharmacy said pt needs emergency refill because pt's refill is a little delay and wont be sent until after 7-10 days

## 2021-10-09 ENCOUNTER — Other Ambulatory Visit: Payer: Self-pay | Admitting: Adult Health

## 2021-10-09 DIAGNOSIS — F411 Generalized anxiety disorder: Secondary | ICD-10-CM

## 2021-10-14 DIAGNOSIS — J449 Chronic obstructive pulmonary disease, unspecified: Secondary | ICD-10-CM | POA: Diagnosis not present

## 2021-10-17 ENCOUNTER — Other Ambulatory Visit: Payer: Self-pay | Admitting: Adult Health

## 2021-10-17 ENCOUNTER — Other Ambulatory Visit: Payer: Self-pay | Admitting: *Deleted

## 2021-10-17 DIAGNOSIS — M79604 Pain in right leg: Secondary | ICD-10-CM

## 2021-10-17 DIAGNOSIS — F411 Generalized anxiety disorder: Secondary | ICD-10-CM

## 2021-10-17 DIAGNOSIS — M79605 Pain in left leg: Secondary | ICD-10-CM

## 2021-10-17 DIAGNOSIS — M5136 Other intervertebral disc degeneration, lumbar region: Secondary | ICD-10-CM

## 2021-10-17 MED ORDER — GABAPENTIN 300 MG PO CAPS: 300.0000 mg | ORAL_CAPSULE | Freq: Every day | ORAL | 0 refills | Status: DC

## 2021-10-18 NOTE — Telephone Encounter (Signed)
Called patient to verify pharmacy for this medication. Refill request coming from [facility], but last refill was sent to [facility]. LVM for patient to [facility].

## 2021-10-18 NOTE — Telephone Encounter (Signed)
Patient returned call and asked Rx be sent to [facility].

## 2021-10-23 ENCOUNTER — Other Ambulatory Visit: Payer: Self-pay | Admitting: Family Medicine

## 2021-10-23 DIAGNOSIS — F331 Major depressive disorder, recurrent, moderate: Secondary | ICD-10-CM

## 2021-10-25 ENCOUNTER — Ambulatory Visit (INDEPENDENT_AMBULATORY_CARE_PROVIDER_SITE_OTHER): Payer: Medicare HMO | Admitting: Family Medicine

## 2021-10-25 ENCOUNTER — Other Ambulatory Visit: Payer: Self-pay

## 2021-10-25 ENCOUNTER — Encounter: Payer: Self-pay | Admitting: Family Medicine

## 2021-10-25 VITALS — BP 127/82 | HR 112 | Temp 98.1°F | Ht 64.5 in | Wt 163.4 lb

## 2021-10-25 DIAGNOSIS — Z720 Tobacco use: Secondary | ICD-10-CM | POA: Diagnosis not present

## 2021-10-25 DIAGNOSIS — N393 Stress incontinence (female) (male): Secondary | ICD-10-CM

## 2021-10-25 DIAGNOSIS — Z789 Other specified health status: Secondary | ICD-10-CM | POA: Diagnosis not present

## 2021-10-25 DIAGNOSIS — R251 Tremor, unspecified: Secondary | ICD-10-CM

## 2021-10-25 DIAGNOSIS — R253 Fasciculation: Secondary | ICD-10-CM | POA: Diagnosis not present

## 2021-10-25 DIAGNOSIS — Z0000 Encounter for general adult medical examination without abnormal findings: Secondary | ICD-10-CM

## 2021-10-25 DIAGNOSIS — Z0001 Encounter for general adult medical examination with abnormal findings: Secondary | ICD-10-CM | POA: Diagnosis not present

## 2021-10-25 NOTE — Progress Notes (Signed)
Subjective:    Alicia Sanchez is a 60 y.o. female who presents for a Welcome to Medicare exam.   Review of Systems Reports tremor/twitching.  The twitching occurs intermittently and at random.  Sometimes the tremor seem to be worse when she is more anxious or stressed.  There is no family history of Parkinson disease but there is seizure disorder and her niece and sister.  She has been seen in [facility] for sleep study which apparently was incomplete recently. Cardiac Risk Factors include: advanced age (>101mn, >>37women);dyslipidemia;hypertension;smoking/ tobacco exposure      Objective:    Today's Vitals   10/25/21 1514  BP: 127/82  Pulse: (!) 112  Temp: 98.1 F (36.7 C)  SpO2: 95%  Weight: 163 lb 6.4 oz (74.1 kg)  Height: 5' 4.5" (1.638 m)  Body mass index is 27.61 kg/m.  Medications Outpatient Encounter Medications as of 10/25/2021  Medication Sig   albuterol (VENTOLIN HFA) 108 (90 Base) MCG/ACT inhaler Inhale 2 puffs into the lungs every 6 (six) hours as needed for wheezing or shortness of breath.   alendronate (FOSAMAX) 70 MG tablet TAKE 1 TABLET EVERY 7 DAYS WITH A FULL GLASS OF WATER ON AN EMPTY STOMACH   ALPRAZolam (XANAX) 0.5 MG tablet Take 0.5-1 tablets (0.25-0.5 mg total) by mouth 3 (three) times daily as needed for anxiety.   aspirin EC 81 MG tablet Take 81 mg by mouth daily.   atorvastatin (LIPITOR) 40 MG tablet Take 1 tablet daily   Budeson-Glycopyrrol-Formoterol (BREZTRI AEROSPHERE) 160-9-4.8 MCG/ACT AERO Inhale 2 puffs into the lungs 2 (two) times daily.   busPIRone (BUSPAR) 30 MG tablet Take 1 tablet (30 mg total) by mouth 2 (two) times daily.   calcium carbonate (OS-CAL) 1250 (500 Ca) MG chewable tablet Chew 1 tablet (1,250 mg total) by mouth daily.   diltiazem (CARDIZEM) 30 MG tablet TAKE 1 TABLET TWICE DAILY AS NEEDED (NEED MD APPOINTMENT)   DULoxetine (CYMBALTA) 60 MG capsule TAKE 2 CAPSULES (120 MG TOTAL) EVERY DAY   EQ ALLERGY  RELIEF, CETIRIZINE, 10 MG tablet Take 1 tablet by mouth once daily   gabapentin (NEURONTIN) 300 MG capsule Take 1 capsule (300 mg total) by mouth at bedtime.   HYDROcodone-acetaminophen (NORCO) 10-325 MG tablet Take 1 tablet by mouth every 6 (six) hours as needed. 1/2 tablet   ipratropium-albuterol (DUONEB) 0.5-2.5 (3) MG/3ML SOLN Take 3 mLs by nebulization every 6 (six) hours as needed.   meclizine (ANTIVERT) 25 MG tablet TAKE 1 TABLET BY MOUTH THREE TIMES DAILY AS NEEDED FOR DIZZINESS   meloxicam (MOBIC) 7.5 MG tablet TAKE 1 TABLET EVERY DAY   Multiple Vitamin (MULTIVITAMIN WITH MINERALS) TABS tablet Take 1 tablet by mouth daily.   Multiple Vitamins-Minerals (VITAMIN D3 COMPLETE PO) Take 1 tablet by mouth daily.   nitroGLYCERIN (NITROSTAT) 0.4 MG SL tablet Place 1 tablet (0.4 mg total) under the tongue every 5 (five) minutes x 3 doses as needed for chest pain (if o relief after 2nd dose, proceed to the ED for an evaluation or call 911).   ramelteon (ROZEREM) 8 MG tablet Take 1 tablet (8 mg total) by mouth at bedtime. Insomnia refractory to Melatonin, Doxepin, Xanax   Respiratory Therapy Supplies (NEBULIZER/TUBING/MOUTHPIECE) KIT 1 Units 4 (four) times daily by Does not apply route. GIVE face mask and tubing for nebulizer   rOPINIRole (REQUIP) 1 MG tablet TAKE 1 TO 4 TABLETS AT BEDTIME   vitamin B-12 (CYANOCOBALAMIN) 100 MCG tablet Take 100 mcg by mouth  daily.   vitamin C (ASCORBIC ACID) 250 MG tablet Take 500-1,000 mg by mouth daily.   No facility-administered encounter medications on file as of 10/25/2021.     History: Past Medical History:  Diagnosis Date   Anxiety    COPD (chronic obstructive pulmonary disease) ([place])    Osteoporosis    Past Surgical History:  Procedure Laterality Date   ABDOMINAL HYSTERECTOMY      Family History  Problem Relation Age of Onset   COPD Mother    COPD Father    Diabetes type II Sister    Diabetes type II Brother    COPD Maternal Grandfather     Social History   Occupational History   Not on file  Tobacco Use   Smoking status: Former    Packs/day: 0.50    Years: 42.00    Pack years: 21.00    Types: Cigarettes   Smokeless tobacco: Never   Tobacco comments:    1/2 pack- 1pack a day  Vaping Use   Vaping Use: Never used  Substance and Sexual Activity   Alcohol use: No   Drug use: No   Sexual activity: Yes    Birth control/protection: Surgical    Tobacco Counseling Counseling given: Not Answered Tobacco comments: 1/2 pack- 1pack a day   Immunizations and Health Maintenance Immunization History  Administered Date(s) Administered   Influenza Split 08/25/2016   Influenza,inj,Quad PF,6+ Mos 09/25/2017, 07/16/2018, 10/25/2020, 08/30/2021   Influenza-Unspecified 07/16/2018   Moderna Sars-Covid-2 Vaccination 01/19/2020, 02/16/2020   Pneumococcal Polysaccharide-23 05/23/2018   Tdap 05/19/2007, 07/08/2014   There are no preventive care reminders to display for this patient.  Activities of Daily Living In your present state of health, do you have any difficulty performing the following activities: 10/25/2021  Hearing? N  Vision? N  Difficulty concentrating or making decisions? N  Walking or climbing stairs? N  Dressing or bathing? N  Doing errands, shopping? N  Preparing Food and eating ? N  Using the Toilet? N  In the past six months, have you accidently leaked urine? Y  Do you have problems with loss of bowel control? N  Managing your Medications? N  Managing your Finances? N  Housekeeping or managing your Housekeeping? N  Some recent data might be hidden    Physical Exam   Gen: Exotropia of the right eye.  Wears glasses.  Nontoxic-appearing female Pulmonary: Globally decreased breath sounds.  Normal work of breathing on room air.  Smells of tobacco Cardio: Regular rate and rhythm.  S1-S2 heard.  No murmurs Neuro: Fine tremor noted  Advanced Directives: Does Patient Have a Medical Advance Directive?:  No Would patient like information on creating a medical advance directive?: Yes (MAU/Ambulatory/Procedural Areas - Information given)    Assessment:    This is a routine wellness examination for this patient .   Vision/Hearing screen No results found.  Dietary issues and exercise activities discussed:  Current Exercise Habits: The patient does not participate in regular exercise at present   Goals       COPD (pt-stated)      Current Barriers:  Unable to independently afford treatment regimen Unable to achieve control of COPD   Pharmacist Clinical Goal(s):  Over the next 90 days, patient will verbalize ability to afford treatment regimen achieve control of COPD as evidenced by [place] through collaboration with PharmD and provider.    Interventions: 1:1 collaboration with Janora Norlander, DO regarding development  and update of comprehensive plan of care as evidenced by provider attestation and co-signature Inter-disciplinary care team collaboration (see longitudinal plan of care) Comprehensive medication review performed; medication list updated in electronic medical record  Chronic Obstructive Pulmonary Disease: Uncontrolled; current treatment: Breztri Continues on [place], nebs as needed, continue to use rescue inhaler at least twice a day Reviewed new Breztri dosing--> 2 puffs twice daily (was previously only doing 1 puff once daily) We have switched to breztri due to ease of patient assistance Patient assistance supply should ship to patient by next Friday, she has a sample to get her through She quit smoking in 2021 but unfortunately started again due to family stresses GOLD Classification: II Smoking history: 35-pack-year smoking history.  Active smoker Discussed smoking cessation.  She continues to try decreasing the amount of cigarettes She is not taking Chantix, she is not wearing patches She remains willing to  try to quit Continue to counsel Most recent Pulmonary Function Testing:  Pulmonary Functions Testing Results: 2019  TLC  Date Value Ref Range Status  08/18/2018 5.98 L Final   Educated on proper use of Breztri since recent transition from [place] to [place] patient finances. Patient [place] the [place]; approved on 06/16/21 for breztri, will ship to patient's home   Patient Goals/Self-Care Activities Over the next 90 days, patient will:  - take medications as prescribed Work to decrease the amount of cigarretes per day; decrease amount of rescue inhaler as able  Follow Up Plan: Telephone follow up appointment with care management team member scheduled for: 3 months       Quit Smoking       Depression Screen PHQ 2/9 Scores 10/25/2021 07/31/2021 05/19/2021 02/24/2021  PHQ - 2 Score '4 4 1 6  ' PHQ- 9 Score '16 17 5 19     ' Fall Risk Fall Risk  10/25/2021  Falls in the past year? 1  Number falls in past yr: 0  Injury with Fall? 0  Risk for fall due to : History of fall(s)  Follow up Education provided    Cognitive Function: MMSE - Mini Mental State Exam 10/25/2021  Orientation to time 5  Orientation to Place 5  Registration 3  Attention/ Calculation 5  Recall 3  Language- name 2 objects 2  Language- repeat 1  Language- follow 3 step command 3  Language- read & follow direction 1  Write a sentence 1  Copy design 1  Total score 30        Patient Care Team: Janora Norlander, DO as PCP - General (Family Medicine) Harl Bowie, Alphonse Guild, MD as PCP - Cardiology (Cardiology) Marshell Garfinkel, MD as Consulting Physician (Pulmonary Disease) Lavera Guise, [facility] (Pharmacist) Mozingo, Berdie Ogren, NP as Nurse Practitioner (Psychiatry)     Plan:   Welcome to Medicare preventive visit  Full code status  Tremor - Plan: Ambulatory referral to Neurology  Twitching - Plan: Ambulatory referral to Neurology  Stress incontinence of  urine  Tobacco use  She will return advance directive paperwork at her earliest availability  I referred her to neurology for tremor and twitching.  Did not feel that the stress incontinence was bothersome enough to warrant intervention at this time  She is closely followed by pulmonology and gets CAT scans annually.  She is in the contemplative state of smoking cessation  I have personally reviewed and noted the following in the patient's chart:   Medical and social history Use of alcohol, tobacco  or illicit drugs  Current medications and supplements Functional ability and status Nutritional status Physical activity Advanced directives List of other physicians Hospitalizations, surgeries, and ER visits in previous 12 months Vitals Screenings to include cognitive, depression, and falls Referrals and appointments  In addition, I have reviewed and discussed with patient certain preventive protocols, quality metrics, and best practice recommendations. A written personalized care plan for preventive services as well as general preventive health recommendations were provided to patient.     Ronnie Doss, DO 10/25/2021

## 2021-10-25 NOTE — Patient Instructions (Signed)
Thank you for coming in today for your Annual Medicare Wellness Visit.  Things that we discussed today are included in this packet. ? ?Create and/or bring a copy of your Living Will/ Advanced Directive into the office so that we may respect your wishes should an emergency occur. ? ?Get the recommended life-saving vaccines we discussed today. ? ?Get your mammogram/ colonoscopy/ DEXA scans as directed by your provider. ? ?Make sure that your medications are organized and safely stored.  Remember to always ask for help if you forget when/ how to take your medications. ? ?Make healthy food choices (Rich in fruits/ veggies/ lean meats and low in salt, sugar and fat) ? ?Do something that you enjoy for at least 30 minutes every day to stay active (walking, gardening, swimming, etc). This will help you lower your risk of falls/ broken bones. ? ?Be social, do puzzles/ crosswords.  These things help the mind stay young and lower your risk of developing dementia. ? ?Make sure that your home is safe by checking your smoke detectors regularly and doing the things outlined below to lower your risk of falls. ? ? ? ?

## 2021-10-27 ENCOUNTER — Other Ambulatory Visit: Payer: Medicare HMO

## 2021-10-27 ENCOUNTER — Other Ambulatory Visit: Payer: Self-pay

## 2021-10-27 ENCOUNTER — Encounter: Payer: Medicare HMO | Admitting: Family Medicine

## 2021-10-27 ENCOUNTER — Other Ambulatory Visit: Payer: Self-pay | Admitting: Family Medicine

## 2021-10-27 DIAGNOSIS — E782 Mixed hyperlipidemia: Secondary | ICD-10-CM | POA: Diagnosis not present

## 2021-10-27 DIAGNOSIS — Z720 Tobacco use: Secondary | ICD-10-CM

## 2021-10-27 DIAGNOSIS — M810 Age-related osteoporosis without current pathological fracture: Secondary | ICD-10-CM

## 2021-10-28 LAB — CMP14+EGFR
ALT: 17 IU/L (ref 0–32)
AST: 17 IU/L (ref 0–40)
Albumin/Globulin Ratio: 2.5 — ABNORMAL HIGH (ref 1.2–2.2)
Albumin: 4.5 g/dL (ref 3.8–4.8)
Alkaline Phosphatase: 74 IU/L (ref 44–121)
BUN/Creatinine Ratio: 18 (ref 12–28)
BUN: 16 mg/dL (ref 8–27)
Bilirubin Total: 0.4 mg/dL (ref 0.0–1.2)
CO2: 23 mmol/L (ref 20–29)
Calcium: 9 mg/dL (ref 8.7–10.3)
Chloride: 102 mmol/L (ref 96–106)
Creatinine, Ser: 0.9 mg/dL (ref 0.57–1.00)
Globulin, Total: 1.8 g/dL (ref 1.5–4.5)
Glucose: 77 mg/dL (ref 70–99)
Potassium: 4.2 mmol/L (ref 3.5–5.2)
Sodium: 143 mmol/L (ref 134–144)
Total Protein: 6.3 g/dL (ref 6.0–8.5)
eGFR: 73 mL/min/{1.73_m2} (ref >59)

## 2021-10-28 LAB — LIPID PANEL
Chol/HDL Ratio: 2.2 ratio (ref 0.0–4.4)
Cholesterol, Total: 134 mg/dL (ref 100–199)
HDL: 62 mg/dL (ref >39)
LDL Chol Calc (NIH): 48 mg/dL (ref 0–99)
Triglycerides: 138 mg/dL (ref 0–149)
VLDL Cholesterol Cal: 24 mg/dL (ref 5–40)

## 2021-10-28 LAB — CBC
Hematocrit: 45 % (ref 34.0–46.6)
Hemoglobin: 15.3 g/dL (ref 11.1–15.9)
MCH: 31 pg (ref 26.6–33.0)
MCHC: 34 g/dL (ref 31.5–35.7)
MCV: 91 fL (ref 79–97)
Platelets: 310 10*3/uL (ref 150–450)
RBC: 4.93 x10E6/uL (ref 3.77–5.28)
RDW: 12.2 % (ref 11.7–15.4)
WBC: 10.3 10*3/uL (ref 3.4–10.8)

## 2021-10-28 LAB — VITAMIN D 25 HYDROXY (VIT D DEFICIENCY, FRACTURES): Vit D, 25-Hydroxy: 30.4 ng/mL (ref 30.0–100.0)

## 2021-10-28 LAB — TSH: TSH: 1.93 u[IU]/mL (ref 0.450–4.500)

## 2021-11-02 ENCOUNTER — Other Ambulatory Visit: Payer: Self-pay | Admitting: Family Medicine

## 2021-11-02 DIAGNOSIS — M5136 Other intervertebral disc degeneration, lumbar region: Secondary | ICD-10-CM

## 2021-11-02 DIAGNOSIS — M79604 Pain in right leg: Secondary | ICD-10-CM

## 2021-11-03 ENCOUNTER — Ambulatory Visit (INDEPENDENT_AMBULATORY_CARE_PROVIDER_SITE_OTHER): Payer: Medicare HMO | Admitting: Pharmacist

## 2021-11-03 DIAGNOSIS — I10 Essential (primary) hypertension: Secondary | ICD-10-CM

## 2021-11-03 DIAGNOSIS — J439 Emphysema, unspecified: Secondary | ICD-10-CM

## 2021-11-03 DIAGNOSIS — J410 Simple chronic bronchitis: Secondary | ICD-10-CM

## 2021-11-06 ENCOUNTER — Other Ambulatory Visit: Payer: Self-pay | Admitting: Adult Health

## 2021-11-06 DIAGNOSIS — F411 Generalized anxiety disorder: Secondary | ICD-10-CM

## 2021-11-07 NOTE — Telephone Encounter (Signed)
Alicia Sanchez called to check status of Xanax refill.  She made appt for 12/12/21

## 2021-11-08 MED ORDER — BREZTRI AEROSPHERE 160-9-4.8 MCG/ACT IN AERO: 2.0000 | INHALATION_SPRAY | Freq: Two times a day (BID) | RESPIRATORY_TRACT | 11 refills | Status: DC

## 2021-11-08 NOTE — Progress Notes (Signed)
Chronic Care Management Pharmacy Note  11/03/2021 Name:  Alicia Sanchez MRN:  [id number] DOB:  1961-04-28  Summary:COPD, HTN  Recommendations/Changes made from today's visit: Chronic Obstructive Pulmonary Disease: Uncontrolled; current treatment: Breztri Continues on [place], nebs as needed, RESCUE inhaler 1-2 TIMES DAILY Reviewed new Breztri dosing--> 2 puffs twice daily We have switched to breztri due to ease of patient assistance RE-ENROLLMENT SUBMITTED FOR 2023 AZ&ME PATIENT ASSISTANCE She quit smoking in 2021 but unfortunately started again due to family stresses GOLD Classification: II Smoking history: 35-pack-year smoking history.  Active smoker Discussed smoking cessation.  She continues to try decreasing the amount of cigarettes She is not taking Chantix, she is not wearing patches She remains willing to try to quit; NOT READY Continue to counsel Most recent Pulmonary Function Testing:  Pulmonary Functions Testing Results: 2019  TLC  Date Value Ref Range Status  08/18/2018 5.98 L Final    Educated on proper use of Breztri since recent transition from Trelegy to [place] patient finances. Patient to qualify for the AZ&me patient assistance; RE-ENROLLMENT SUBMITTED FOR 2023 AZ&ME PATIENT ASSISTANCE, will ship to patient's home VIA [place]   Patient Goals/Self-Care Activities Over the next 90 days, patient will:  - take medications as prescribed Work to decrease the amount of cigarretes per day; decrease amount of rescue inhaler as able  Follow Up Plan: Telephone follow up appointment with care management team member scheduled for: 3 months  Subjective: Alicia Sanchez is an 60 y.o. year old female who is a primary patient of Janora Norlander, DO.  The CCM team was consulted for assistance with disease management and care coordination needs.    Engaged with patient by telephone for follow up  visit in response to provider referral for pharmacy case management and/or care coordination services.   Consent to Services:  The patient was given information about Chronic Care Management services, agreed to services, and gave verbal consent prior to initiation of services.  Please see initial visit note for detailed documentation.   Patient Care Team: Janora Norlander, DO as PCP - General (Family Medicine) Harl Bowie, Alphonse Guild, MD as PCP - Cardiology (Cardiology) Marshell Garfinkel, MD as Consulting Physician (Pulmonary Disease) Lavera Guise, [facility] (Pharmacist) Mozingo, Berdie Ogren, NP as Nurse Practitioner (Psychiatry)  Objective:  Lab Results  Component Value Date   CREATININE 0.90 10/27/2021   CREATININE 0.82 08/08/2021   CREATININE 0.72 07/19/2021    No results found for: HGBA1C Last diabetic Eye exam: No results found for: HMDIABEYEEXA  Last diabetic Foot exam: No results found for: HMDIABFOOTEX      Component Value Date/Time   CHOL 134 10/27/2021 1156   TRIG 138 10/27/2021 1156   HDL 62 10/27/2021 1156   CHOLHDL 2.2 10/27/2021 1156   [place] 48 10/27/2021 1156    Hepatic Function Latest Ref Rng & Units 10/27/2021 06/03/2020 01/06/2020  Total Protein 6.0 - 8.5 g/dL 6.3 6.3 7.0  Albumin 3.8 - 4.8 g/dL 4.5 4.3 4.6  AST 0 - 40 IU/L '17 23 21  ' ALT 0 - 32 IU/L '17 16 14  ' Alk Phosphatase 44 - 121 IU/L 74 76 80  Total Bilirubin 0.0 - 1.2 mg/dL 0.4 0.2 0.3    Lab Results  Component Value Date/Time   TSH 1.930 10/27/2021 11:56 AM   TSH 1.020 06/03/2020 03:05 PM    CBC Latest Ref Rng & Units 10/27/2021 08/08/2021 06/03/2020  WBC 3.4 -  10.8 x10E3/uL 10.3 12.9(H) 7.4  Hemoglobin 11.1 - 15.9 g/dL 15.3 16.1(H) 14.9  Hematocrit 34.0 - 46.6 % 45.0 46.4(H) 44.5  Platelets 150 - 450 x10E3/uL 310 274 318    Lab Results  Component Value Date/Time   VD25OH 30.4 10/27/2021 11:56 AM    Clinical ASCVD: No  The 10-year ASCVD risk score (Arnett DK, et al., 2019) is: 6.8%    Values used to calculate the score:     Age: 48 years     Sex: Female     Is Non-Hispanic African American: No     Diabetic: No     Tobacco smoker: Yes     Systolic Blood Pressure: 536 mmHg     Is BP treated: Yes     HDL Cholesterol: 62 mg/dL     Total Cholesterol: 134 mg/dL    Other: (CHADS2VASc if Afib, PHQ9 if depression, MMRC or CAT for COPD, ACT, DEXA)  Social History   Tobacco Use  Smoking Status Former   Packs/day: 0.50   Years: 42.00   Pack years: 21.00   Types: Cigarettes  Smokeless Tobacco Never  Tobacco Comments   1/2 pack- 1pack a day   BP Readings from Last 3 Encounters:  10/25/21 127/82  08/30/21 122/78  08/28/21 132/80   Pulse Readings from Last 3 Encounters:  10/25/21 (!) 112  08/30/21 (!) 108  08/28/21 99   Wt Readings from Last 3 Encounters:  10/25/21 163 lb 6.4 oz (74.1 kg)  08/30/21 163 lb 3.2 oz (74 kg)  08/28/21 165 lb 9.6 oz (75.1 kg)    Assessment: Review of patient past medical history, allergies, medications, health status, including review of consultants reports, laboratory and other test data, was performed as part of comprehensive evaluation and provision of chronic care management services.   SDOH:  (Social Determinants of Health) assessments and interventions performed:    CCM Care Plan  Allergies  Allergen Reactions   Doxycycline     Chest pain    Medications Reviewed Today     Reviewed by Everlean Cherry, CMA (Certified Medical Assistant) on 10/25/21 at 1512  Med List Status: <None>   Medication Order Taking? Sig Documenting Provider Last Dose Status Informant  albuterol (VENTOLIN HFA) 108 (90 Base) MCG/ACT inhaler [id number]  Inhale 2 puffs into the lungs every 6 (six) hours as needed for wheezing or shortness of breath. Marshell Garfinkel, MD  Active Self  alendronate (FOSAMAX) 70 MG tablet [id number]  TAKE 1 TABLET EVERY 7 DAYS WITH A FULL GLASS OF WATER ON AN EMPTY STOMACH Ronnie Doss M, DO  Active   ALPRAZolam  (XANAX) 0.5 MG tablet [id number]  Take 0.5-1 tablets (0.25-0.5 mg total) by mouth 3 (three) times daily as needed for anxiety. Mozingo, Berdie Ogren, NP  Active   aspirin EC 81 MG tablet [id number]  Take 81 mg by mouth daily. [provider]  Active Self  atorvastatin (LIPITOR) 40 MG tablet [id number]  Take 1 tablet daily Arnoldo Lenis, MD  Active   Budeson-Glycopyrrol-Formoterol (BREZTRI AEROSPHERE) 160-9-4.8 MCG/ACT Hollie Salk [id number]  Inhale 2 puffs into the lungs 2 (two) times daily. Ronnie Doss M, DO  Active Self  busPIRone (BUSPAR) 30 MG tablet [id number]  Take 1 tablet (30 mg total) by mouth 2 (two) times daily. Ronnie Doss M, DO  Active Self  calcium carbonate (OS-CAL) 1250 (500 Ca) MG chewable tablet [id number]  Chew 1 tablet (1,250 mg total) by mouth daily. Janora Norlander, DO  Active Self  diltiazem (CARDIZEM) 30 MG tablet [id number]  TAKE 1 TABLET TWICE DAILY AS NEEDED (NEED MD APPOINTMENT) Arnoldo Lenis, MD  Active Self  DULoxetine (CYMBALTA) 60 MG capsule [id number]  TAKE 2 CAPSULES (120 MG TOTAL) EVERY DAY Ronnie Doss M, DO  Active   EQ ALLERGY RELIEF, CETIRIZINE, 10 MG tablet [id number]  Take 1 tablet by mouth once daily Gottschalk, Ashly M, DO  Active   gabapentin (NEURONTIN) 300 MG capsule [id number]  Take 1 capsule (300 mg total) by mouth at bedtime. Ronnie Doss M, DO  Active   HYDROcodone-acetaminophen (NORCO) 10-325 MG tablet [id number]  Take 1 tablet by mouth every 6 (six) hours as needed. 1/2 tablet [provider]  Active   ipratropium-albuterol (DUONEB) 0.5-2.5 (3) MG/3ML SOLN [id number]  Take 3 mLs by nebulization every 6 (six) hours as needed. Marshell Garfinkel, MD  Active Self  meclizine (ANTIVERT) 25 MG tablet [id number]  TAKE 1 TABLET BY MOUTH THREE TIMES DAILY AS NEEDED FOR DIZZINESS Ronnie Doss M, DO  Active Self  meloxicam (MOBIC) 7.5 MG tablet [id number]  TAKE 1 TABLET EVERY DAY Ronnie Doss M, DO  Active   Multiple  Vitamin (MULTIVITAMIN WITH MINERALS) TABS tablet [id number]  Take 1 tablet by mouth daily. [provider]  Active Self  Multiple Vitamins-Minerals (VITAMIN D3 COMPLETE PO) [id number]  Take 1 tablet by mouth daily. [provider]  Active Self  nitroGLYCERIN (NITROSTAT) 0.4 MG SL tablet [id number]  Place 1 tablet (0.4 mg total) under the tongue every 5 (five) minutes x 3 doses as needed for chest pain (if o relief after 2nd dose, proceed to the ED for an evaluation or call 911). Verta Ellen., NP  Active   ramelteon (ROZEREM) 8 MG tablet [id number]  Take 1 tablet (8 mg total) by mouth at bedtime. Insomnia refractory to Melatonin, Doxepin, Xanax Janora Norlander, DO  Active Self  Respiratory Therapy Supplies (NEBULIZER/TUBING/MOUTHPIECE) KIT [id number]  1 Units 4 (four) times daily by Does not apply route. GIVE face mask and tubing for nebulizer Terald Sleeper, PA-C  Active Self  rOPINIRole (REQUIP) 1 MG tablet [id number]  TAKE 1 TO 4 TABLETS AT BEDTIME Ronnie Doss M, DO  Active   vitamin B-12 (CYANOCOBALAMIN) 100 MCG tablet [id number]  Take 100 mcg by mouth daily. [provider]  Active Self  vitamin C (ASCORBIC ACID) 250 MG tablet [id number]  Take 500-1,000 mg by mouth daily. 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   Other spondylosis with radiculopathy, cervical region 12/21/2018   Chronic left shoulder pain 11/24/2018   Arthritis 11/24/2018   Stress incontinence of urine 08/12/2018   Actinic keratosis 07/17/2018   Chronic respiratory failure with hypoxia ([place]) 07/16/2018   Abnormal findings on diagnostic imaging of lung 06/18/2018   Lung nodules 08/21/2017   GOLD COPD II D 08/21/2017    Recurrent UTI 08/21/2017   Generalized anxiety disorder 03/22/2017   Moderate episode of recurrent major depressive disorder ([place]) 12/16/2016   Restless legs 12/16/2016   OAB (overactive bladder) 12/16/2016   Leukocytosis 11/19/2015   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   Tobacco abuse 11/19/2015    Immunization History  Administered Date(s) Administered  Influenza Split 08/25/2016   Influenza,inj,Quad PF,6+ Mos 09/25/2017, 07/16/2018, 10/25/2020, 08/30/2021   Influenza-Unspecified 07/16/2018   Moderna Sars-Covid-2 Vaccination 01/19/2020, 02/16/2020   Pneumococcal Polysaccharide-23 05/23/2018   Tdap 05/19/2007, 07/08/2014    Conditions to be addressed/monitored: HTN and COPD  Care Plan : PHARMD MEDICATION MANAGEMENT  Updates made by Lavera Guise, [place] since 11/08/2021 12:00 AM     Problem: DISEASE PROGRESION PREVENTION      Long-Range Goal: COPD   Recent Progress: Not on track  Priority: High  Note:   Current Barriers:  Unable to independently afford treatment regimen Unable to achieve control of COPD   Pharmacist Clinical Goal(s):  Over the next 90 days, patient will verbalize ability to afford treatment regimen achieve control of COPD as evidenced by [place]  through collaboration with PharmD and provider.    Interventions: 1:1 collaboration with Janora Norlander, DO regarding development and update of comprehensive plan of care as evidenced by provider attestation and co-signature Inter-disciplinary care team collaboration (see longitudinal plan of care) Comprehensive medication review performed; medication list updated in electronic medical record  Chronic Obstructive Pulmonary Disease: Uncontrolled; current treatment: Breztri Continues on Breztri, nebs as needed, RESCUE inhaler 1-2 TIMES DAILY Reviewed new Breztri dosing--> 2 puffs twice daily We have switched to breztri due to ease of patient assistance RE-ENROLLMENT  SUBMITTED FOR 2023 AZ&ME PATIENT ASSISTANCE She quit smoking in 2021 but unfortunately started again due to family stresses GOLD Classification: II Smoking history: 35-pack-year smoking history.  Active smoker Discussed smoking cessation.  She continues to try decreasing the amount of cigarettes She is not taking Chantix, she is not wearing patches She remains willing to try to quit; NOT READY Continue to counsel Most recent Pulmonary Function Testing:  Pulmonary Functions Testing Results: 2019  TLC  Date Value Ref Range Status  08/18/2018 5.98 L Final   Educated on proper use of Breztri since recent transition from Trelegy to [place] patient finances. Patient to qualify for the AZ&me patient assistance; RE-ENROLLMENT SUBMITTED FOR 2023 AZ&ME PATIENT ASSISTANCE, will ship to patient's home VIA [place]   Patient Goals/Self-Care Activities Over the next 90 days, patient will:  - take medications as prescribed Work to decrease the amount of cigarretes per day; decrease amount of rescue inhaler as able  Follow Up Plan: Telephone follow up appointment with care management team member scheduled for: 3 months      Medication Assistance: Application for BREZTRI/AZ&ME  medication assistance program. in process.  Anticipated assistance start date TBD.  See plan of care for additional detail.  Patient's preferred pharmacy is:  [facility] [address], [place] [address] Phone: [phone] Fax: [phone]  [facility] [place], [place] [address] Phone: [phone] Fax: [place], [place]. [place] [address] Phone: [phone] Fax: [phone]   Follow Up:  Patient agrees to Care Plan and Follow-up.  Plan: Telephone follow up  appointment with care management team member scheduled for:  3 MONTHS    Regina Eck, PharmD, BCPS Clinical Pharmacist, [place]  II Phone [phone]

## 2021-11-08 NOTE — Patient Instructions (Addendum)
Visit Information  Thank you for taking time to visit with me today. Please don't hesitate to contact me if I can be of assistance to you before our next scheduled telephone appointment.  Following are the goals we discussed today:  Summary:COPD, HTN  Recommendations/Changes made from today's visit: Chronic Obstructive Pulmonary Disease: Uncontrolled; current treatment: Breztri Continues on [place], nebs as needed, RESCUE inhaler 1-2 TIMES DAILY Reviewed new Breztri dosing--> 2 puffs twice daily We have switched to breztri due to ease of patient assistance RE-ENROLLMENT SUBMITTED FOR 2023 AZ&ME PATIENT ASSISTANCE She quit smoking in 2021 but unfortunately started again due to family stresses GOLD Classification: II Smoking history: 35-pack-year smoking history.  Active smoker Discussed smoking cessation.  She continues to try decreasing the amount of cigarettes She is not taking Chantix, she is not wearing patches She remains willing to try to quit; NOT READY Continue to counsel Most recent Pulmonary Function Testing:  Pulmonary Functions Testing Results: 2019  TLC  Date Value Ref Range Status  08/18/2018 5.98 L Final    Educated on proper use of Breztri since recent transition from Trelegy to [place] Assessed patient finances. Patient to qualify for the AZ&me patient assistance; RE-ENROLLMENT SUBMITTED FOR 2023 AZ&ME PATIENT ASSISTANCE, will ship to patient's home VIA [employer] MAIL ORDER ON BEHALF OF THE AZ&ME PATIENT ASSISTANCE PROGRAM   Patient Goals/Self-Care Activities Over the next 90 days, patient will:  - take medications as prescribed Work to decrease the amount of cigarretes per day; decrease amount of rescue inhaler as able  Follow Up Plan: Telephone follow up appointment with care management team member scheduled for: 3 months  Please call the care guide team at [phone] if you need to cancel or reschedule your appointment.   Patient verbalizes understanding  of instructions provided today and agrees to view in MyChart.    Kieth Brightly, PharmD, BCPS Clinical Pharmacist, [facility]  II Phone [phone]

## 2021-11-09 ENCOUNTER — Encounter: Payer: Self-pay | Admitting: Family Medicine

## 2021-11-09 ENCOUNTER — Ambulatory Visit (INDEPENDENT_AMBULATORY_CARE_PROVIDER_SITE_OTHER): Payer: Medicare HMO | Admitting: Family Medicine

## 2021-11-09 DIAGNOSIS — R051 Acute cough: Secondary | ICD-10-CM | POA: Diagnosis not present

## 2021-11-09 DIAGNOSIS — J069 Acute upper respiratory infection, unspecified: Secondary | ICD-10-CM | POA: Diagnosis not present

## 2021-11-09 NOTE — Progress Notes (Signed)
Virtual Visit via Telephone Note  I connected with Alicia Sanchez on 11/09/21 at 12:49 PM by telephone and verified that I am speaking with the correct person using two identifiers. Alicia Sanchez is currently located at home and nobody is currently with her during this visit. The provider, Loman Brooklyn, FNP is located in their office at time of visit.  I discussed the limitations, risks, security and privacy concerns of performing an evaluation and management service by telephone and the availability of in person appointments. I also discussed with the patient that there may be a patient responsible charge related to this service. The patient expressed understanding and agreed to proceed.  Subjective: PCP: Janora Norlander, DO  Chief Complaint  Patient presents with   Cough   Patient complains of cough, head/chest congestion, headache, sneezing, sore throat, postnasal drainage, shortness of breath, and wheezing. Wheezing and shortness of breath are slightly worse than usual. Cough has very little production. Onset of symptoms was 2 days ago, gradually worsening since that time. She is drinking plenty of fluids. Evaluation to date: at home COVID test negative. Treatment to date:  Nyquil . She has a history of COPD. She does smoke.    ROS: Per HPI  Current Outpatient Medications:    albuterol (VENTOLIN HFA) 108 (90 Base) MCG/ACT inhaler, Inhale 2 puffs into the lungs every 6 (six) hours as needed for wheezing or shortness of breath., Disp: 3 each, Rfl: 2   alendronate (FOSAMAX) 70 MG tablet, TAKE 1 TABLET EVERY 7 DAYS WITH A FULL GLASS OF WATER ON AN EMPTY STOMACH, Disp: 12 tablet, Rfl: 0   ALPRAZolam (XANAX) 0.5 MG tablet, TAKE 1/2 TO 1 (ONE-HALF TO ONE) TABLET BY MOUTH THREE TIMES DAILY AS NEEDED FOR ANXIETY, Disp: 90 tablet, Rfl: 0   aspirin EC 81 MG tablet, Take 81 mg by mouth daily., Disp: , Rfl:    atorvastatin (LIPITOR) 40 MG tablet, Take 1 tablet daily, Disp: 30 tablet, Rfl:  0   Budeson-Glycopyrrol-Formoterol (BREZTRI AEROSPHERE) 160-9-4.8 MCG/ACT AERO, Inhale 2 puffs into the lungs 2 (two) times daily., Disp: 10.7 g, Rfl: 11   busPIRone (BUSPAR) 30 MG tablet, Take 1 tablet (30 mg total) by mouth 2 (two) times daily., Disp: 180 tablet, Rfl: 3   calcium carbonate (OS-CAL) 1250 (500 Ca) MG chewable tablet, Chew 1 tablet (1,250 mg total) by mouth daily., Disp: 100 tablet, Rfl: 3   diltiazem (CARDIZEM) 30 MG tablet, TAKE 1 TABLET TWICE DAILY AS NEEDED (NEED MD APPOINTMENT), Disp: 60 tablet, Rfl: 4   DULoxetine (CYMBALTA) 60 MG capsule, TAKE 2 CAPSULES (120 MG TOTAL) EVERY DAY, Disp: 180 capsule, Rfl: 0   EQ ALLERGY RELIEF, CETIRIZINE, 10 MG tablet, Take 1 tablet by mouth once daily, Disp: 90 tablet, Rfl: 1   gabapentin (NEURONTIN) 300 MG capsule, Take 1 capsule (300 mg total) by mouth at bedtime., Disp: 90 capsule, Rfl: 0   ipratropium-albuterol (DUONEB) 0.5-2.5 (3) MG/3ML SOLN, Take 3 mLs by nebulization every 6 (six) hours as needed., Disp: 1080 mL, Rfl: 2   meclizine (ANTIVERT) 25 MG tablet, TAKE 1 TABLET BY MOUTH THREE TIMES DAILY AS NEEDED FOR DIZZINESS, Disp: 180 tablet, Rfl: 0   meloxicam (MOBIC) 7.5 MG tablet, TAKE 1 TABLET EVERY DAY, Disp: 90 tablet, Rfl: 0   Multiple Vitamin (MULTIVITAMIN WITH MINERALS) TABS tablet, Take 1 tablet by mouth daily., Disp: , Rfl:    Multiple Vitamins-Minerals (VITAMIN D3 COMPLETE PO), Take 1 tablet by mouth daily., Disp: , Rfl:  nitroGLYCERIN (NITROSTAT) 0.4 MG SL tablet, Place 1 tablet (0.4 mg total) under the tongue every 5 (five) minutes x 3 doses as needed for chest pain (if o relief after 2nd dose, proceed to the ED for an evaluation or call 911)., Disp: 25 tablet, Rfl: 1   ramelteon (ROZEREM) 8 MG tablet, Take 1 tablet (8 mg total) by mouth at bedtime. Insomnia refractory to Melatonin, Doxepin, Xanax, Disp: 90 tablet, Rfl: 3   Respiratory Therapy Supplies (NEBULIZER/TUBING/MOUTHPIECE) KIT, 1 Units 4 (four) times daily by Does  not apply route. GIVE face mask and tubing for nebulizer, Disp: 1 each, Rfl: 11   rOPINIRole (REQUIP) 1 MG tablet, TAKE 1 TO 4 TABLETS AT BEDTIME, Disp: 360 tablet, Rfl: 0   vitamin B-12 (CYANOCOBALAMIN) 100 MCG tablet, Take 100 mcg by mouth daily., Disp: , Rfl:    vitamin C (ASCORBIC ACID) 250 MG tablet, Take 500-1,000 mg by mouth daily., Disp: , Rfl:   Allergies  Allergen Reactions   Doxycycline     Chest pain   Past Medical History:  Diagnosis Date   Anxiety    COPD (chronic obstructive pulmonary disease) ([place])    Osteoporosis     Observations/Objective: A&O  No respiratory distress or wheezing audible over the phone Mood, judgement, and thought processes all WNL  Assessment and Plan: 1. Viral URI Symptom management.   2. Acute cough - [place] Flu A/B Waived; Future - Veritor Flu A/B Waived   Follow Up Instructions:  I discussed the assessment and treatment plan with the patient. The patient was provided an opportunity to ask questions and all were answered. The patient agreed with the plan and demonstrated an understanding of the instructions.   The patient was advised to call back or seek an in-person evaluation if the symptoms worsen or if the condition fails to improve as anticipated.  The above assessment and management plan was discussed with the patient. The patient verbalized understanding of and has agreed to the management plan. Patient is aware to call the clinic if symptoms persist or worsen. Patient is aware when to return to the clinic for a follow-up visit. Patient educated on when it is appropriate to go to the emergency department.   Time call ended: 1:00 PM  I provided 11 minutes of non-face-to-face time during this encounter.  Hendricks Limes, MSN, APRN, FNP-C [facility] 11/09/21

## 2021-11-12 ENCOUNTER — Other Ambulatory Visit: Payer: Self-pay | Admitting: Family Medicine

## 2021-11-12 DIAGNOSIS — F411 Generalized anxiety disorder: Secondary | ICD-10-CM

## 2021-11-13 DIAGNOSIS — J449 Chronic obstructive pulmonary disease, unspecified: Secondary | ICD-10-CM | POA: Diagnosis not present

## 2021-11-14 ENCOUNTER — Other Ambulatory Visit: Payer: Self-pay | Admitting: Adult Health

## 2021-11-14 DIAGNOSIS — F411 Generalized anxiety disorder: Secondary | ICD-10-CM

## 2021-11-15 ENCOUNTER — Other Ambulatory Visit: Payer: Self-pay | Admitting: Pulmonary Disease

## 2021-11-15 ENCOUNTER — Other Ambulatory Visit: Payer: Self-pay | Admitting: Family Medicine

## 2021-11-15 DIAGNOSIS — G2581 Restless legs syndrome: Secondary | ICD-10-CM

## 2021-11-15 DIAGNOSIS — F411 Generalized anxiety disorder: Secondary | ICD-10-CM

## 2021-11-15 DIAGNOSIS — M5136 Other intervertebral disc degeneration, lumbar region: Secondary | ICD-10-CM

## 2021-11-15 DIAGNOSIS — M1990 Unspecified osteoarthritis, unspecified site: Secondary | ICD-10-CM

## 2021-11-15 DIAGNOSIS — M79604 Pain in right leg: Secondary | ICD-10-CM

## 2021-11-16 NOTE — Telephone Encounter (Signed)
LMOVM Dr Reece Agar ok'd refills, please call the office back to make your 6 mos appt

## 2021-11-16 NOTE — Telephone Encounter (Signed)
Appt made for first available 12/21/21

## 2021-11-18 DIAGNOSIS — J439 Emphysema, unspecified: Secondary | ICD-10-CM

## 2021-11-18 DIAGNOSIS — J410 Simple chronic bronchitis: Secondary | ICD-10-CM

## 2021-11-18 DIAGNOSIS — I10 Essential (primary) hypertension: Secondary | ICD-10-CM

## 2021-11-21 ENCOUNTER — Other Ambulatory Visit: Payer: Self-pay

## 2021-11-21 ENCOUNTER — Encounter: Payer: Self-pay | Admitting: Family Medicine

## 2021-11-21 ENCOUNTER — Ambulatory Visit (INDEPENDENT_AMBULATORY_CARE_PROVIDER_SITE_OTHER): Payer: Medicare HMO | Admitting: Family Medicine

## 2021-11-21 VITALS — BP 104/71 | HR 93 | Temp 98.1°F | Ht 64.5 in | Wt 166.0 lb

## 2021-11-21 DIAGNOSIS — J441 Chronic obstructive pulmonary disease with (acute) exacerbation: Secondary | ICD-10-CM | POA: Diagnosis not present

## 2021-11-21 DIAGNOSIS — G479 Sleep disorder, unspecified: Secondary | ICD-10-CM

## 2021-11-21 MED ORDER — CEFDINIR 300 MG PO CAPS: 300.0000 mg | ORAL_CAPSULE | Freq: Two times a day (BID) | ORAL | 0 refills | Status: DC

## 2021-11-21 MED ORDER — PREDNISONE 20 MG PO TABS: ORAL_TABLET | ORAL | 0 refills | Status: DC

## 2021-11-21 MED ORDER — NEBULIZER/TUBING/MOUTHPIECE KIT: 1.0000 [IU] | PACK | Freq: Four times a day (QID) | 0 refills | Status: AC

## 2021-11-21 NOTE — Progress Notes (Signed)
Subjective: CC: URI PCP: Alicia Norlander, DO [id number] Alicia Sanchez is a 61 y.o. female presenting to clinic today for:  1.  URI Patient was seen via virtual visit on 11/09/2021 for what was suspected to be a viral URI with associated cough.  Since that visit she notes the cough has become more productive and dark.  Denies any hemoptysis or blood.  She sometimes feels a little bit more short of breath and wheezy.  She denies any fevers.  She had negative home COVID test.  She is compliant with her inhalers.   2.  Sleep difficulties Patient reports difficulty with sleep.  She notes this is despite use of Raza REM and other medications prescribed to her.  She is asking for Ambien today.  She is not reached out to her psychiatrist about this.  She notes difficulty both falling asleep and staying asleep  ROS: Per HPI  Allergies  Allergen Reactions   Doxycycline     Chest pain   Past Medical History:  Diagnosis Date   Anxiety    COPD (chronic obstructive pulmonary disease) (HCC)    Osteoporosis     Current Outpatient Medications:    albuterol (VENTOLIN HFA) 108 (90 Base) MCG/ACT inhaler, Inhale 2 puffs into the lungs every 6 (six) hours as needed for wheezing or shortness of breath., Disp: 3 each, Rfl: 2   alendronate (FOSAMAX) 70 MG tablet, TAKE 1 TABLET EVERY 7 DAYS WITH A FULL GLASS OF WATER ON AN EMPTY STOMACH, Disp: 12 tablet, Rfl: 0   ALPRAZolam (XANAX) 0.5 MG tablet, TAKE 1/2 TO 1 (ONE-HALF TO ONE) TABLET BY MOUTH THREE TIMES DAILY AS NEEDED FOR ANXIETY, Disp: 90 tablet, Rfl: 0   aspirin EC 81 MG tablet, Take 81 mg by mouth daily., Disp: , Rfl:    atorvastatin (LIPITOR) 40 MG tablet, Take 1 tablet daily, Disp: 30 tablet, Rfl: 0   Budeson-Glycopyrrol-Formoterol (BREZTRI AEROSPHERE) 160-9-4.8 MCG/ACT AERO, Inhale 2 puffs into the lungs 2 (two) times daily., Disp: 10.7 g, Rfl: 11   busPIRone (BUSPAR) 30 MG tablet, TAKE 1 TABLET TWICE DAILY, Disp: 180 tablet, Rfl: 0   calcium  carbonate (OS-CAL) 1250 (500 Ca) MG chewable tablet, Chew 1 tablet (1,250 mg total) by mouth daily., Disp: 100 tablet, Rfl: 3   diltiazem (CARDIZEM) 30 MG tablet, TAKE 1 TABLET TWICE DAILY AS NEEDED (NEED MD APPOINTMENT), Disp: 60 tablet, Rfl: 4   DULoxetine (CYMBALTA) 60 MG capsule, TAKE 2 CAPSULES (120 MG TOTAL) EVERY DAY, Disp: 180 capsule, Rfl: 0   EQ ALLERGY RELIEF, CETIRIZINE, 10 MG tablet, Take 1 tablet by mouth once daily, Disp: 90 tablet, Rfl: 1   gabapentin (NEURONTIN) 300 MG capsule, TAKE 1 CAPSULE (300 MG TOTAL) BY MOUTH AT BEDTIME, Disp: 90 capsule, Rfl: 0   ipratropium-albuterol (DUONEB) 0.5-2.5 (3) MG/3ML SOLN, Take 3 mLs by nebulization every 6 (six) hours as needed., Disp: 360 mL, Rfl: 5   meclizine (ANTIVERT) 25 MG tablet, TAKE 1 TABLET BY MOUTH THREE TIMES DAILY AS NEEDED FOR DIZZINESS, Disp: 180 tablet, Rfl: 0   meloxicam (MOBIC) 7.5 MG tablet, TAKE 1 TABLET EVERY DAY, Disp: 90 tablet, Rfl: 0   Multiple Vitamin (MULTIVITAMIN WITH MINERALS) TABS tablet, Take 1 tablet by mouth daily., Disp: , Rfl:    Multiple Vitamins-Minerals (VITAMIN D3 COMPLETE PO), Take 1 tablet by mouth daily., Disp: , Rfl:    nitroGLYCERIN (NITROSTAT) 0.4 MG SL tablet, Place 1 tablet (0.4 mg total) under the tongue every 5 (five) minutes x  3 doses as needed for chest pain (if o relief after 2nd dose, proceed to the ED for an evaluation or call 911)., Disp: 25 tablet, Rfl: 1   ramelteon (ROZEREM) 8 MG tablet, Take 1 tablet (8 mg total) by mouth at bedtime. Insomnia refractory to Melatonin, Doxepin, Xanax, Disp: 90 tablet, Rfl: 3   Respiratory Therapy Supplies (NEBULIZER/TUBING/MOUTHPIECE) KIT, 1 Units 4 (four) times daily by Does not apply route. GIVE face mask and tubing for nebulizer, Disp: 1 each, Rfl: 11   rOPINIRole (REQUIP) 1 MG tablet, TAKE 1 TO 4 TABLETS AT BEDTIME, Disp: 360 tablet, Rfl: 0   vitamin B-12 (CYANOCOBALAMIN) 100 MCG tablet, Take 100 mcg by mouth daily., Disp: , Rfl:    vitamin C (ASCORBIC  ACID) 250 MG tablet, Take 500-1,000 mg by mouth daily., Disp: , Rfl:  Social History   Socioeconomic History   Marital status: Married    Spouse name: Not on file   Number of children: Not on file   Years of education: Not on file   Highest education level: Not on file  Occupational History   Not on file  Tobacco Use   Smoking status: Former    Packs/day: 0.50    Years: 42.00    Pack years: 21.00    Types: Cigarettes   Smokeless tobacco: Never   Tobacco comments:    1/2 pack- 1pack a day  Vaping Use   Vaping Use: Never used  Substance and Sexual Activity   Alcohol use: No   Drug use: No   Sexual activity: Yes    Birth control/protection: Surgical  Other Topics Concern   Not on file  Social History Narrative   Not on file   Social Determinants of Health   Financial Resource Strain: Not on file  Food Insecurity: No Food Insecurity   Worried About Charity fundraiser in the Last Year: Never true   Ran Out of Food in the Last Year: Never true  Transportation Needs: Not on file  Physical Activity: Not on file  Stress: Stress Concern Present   Feeling of Stress : Rather much  Social Connections: Not on file  Intimate Partner Violence: Not At Risk   Fear of Current or Ex-Partner: No   Emotionally Abused: No   Physically Abused: No   Sexually Abused: No   Family History  Problem Relation Age of Onset   COPD Mother    COPD Father    Diabetes type II Sister    Diabetes type II Brother    COPD Maternal Grandfather     Objective: Office vital signs reviewed. BP 104/71    Pulse 93    Temp 98.1 F (36.7 C)    Ht 5' 4.5" (1.638 m)    Wt 166 lb (75.3 kg)    SpO2 93%    BMI 28.05 kg/m   Physical Examination:  General: Awake, alert, nontoxic female, No acute distress Cardio: regular rate and rhythm, S1S2 heard, no murmurs appreciated Pulm: Bibasilar rhonchi and wheezes.  Normal work of breathing on room air.  No observed coughing spells Psych: Mood  stable  Depression screen [facility] 2/9 11/21/2021 10/25/2021 07/31/2021  Decreased Interest '2 2 2  ' Down, Depressed, Hopeless '1 2 2  ' PHQ - 2 Score '3 4 4  ' Altered sleeping '2 2 2  ' Tired, decreased energy '2 2 3  ' Change in appetite '1 2 1  ' Feeling bad or failure about yourself  '1 2 2  ' Trouble concentrating 1 1  2  Moving slowly or fidgety/restless '2 1 1  ' Suicidal thoughts '1 2 2  ' PHQ-9 Score '13 16 17  ' Difficult doing work/chores Somewhat difficult - Somewhat difficult  Some recent data might be hidden   GAD 7 : Generalized Anxiety Score 11/21/2021 10/25/2021 07/31/2021 05/19/2021  Nervous, Anxious, on Edge '2 3 3 ' 0  Control/stop worrying '2 3 3 1  ' Worry too much - different things '2 3 3 1  ' Trouble relaxing '2 2 2 1  ' Restless '1 2 2 ' 0  Easily annoyed or irritable '1 2 3 ' 0  Afraid - awful might happen '1 1 3 1  ' Total GAD 7 Score '11 16 19 4  ' Anxiety Difficulty Somewhat difficult Very difficult Somewhat difficult Not difficult at all    Assessment/ Plan: 61 y.o. female   COPD with acute exacerbation ([place]) - Plan: predniSONE (DELTASONE) 20 MG tablet, cefdinir (OMNICEF) 300 MG capsule, Respiratory Therapy Supplies (NEBULIZER/TUBING/MOUTHPIECE) KIT  Sleep difficulties  Having an exacerbation of her COPD despite compliance with medications.  Suspect ongoing tobacco is contributive.  Prednisone burst, Omnicef.  New nebulizer Rx given.  Home care instructions reviewed.  Follow-up as needed.  Will CC to pulmonology  Patient requesting Ambien today.  She has medical management for mental health by psychiatry so I will CC them.  I was not willing to prescribe this today.  Okay to discontinue also if not helpful  No orders of the defined types were placed in this encounter.  No orders of the defined types were placed in this encounter.    Alicia Norlander, DO [place] [phone]

## 2021-11-21 NOTE — Patient Instructions (Signed)

## 2021-12-04 ENCOUNTER — Other Ambulatory Visit: Payer: Self-pay | Admitting: Adult Health

## 2021-12-04 ENCOUNTER — Other Ambulatory Visit: Payer: Self-pay | Admitting: Family Medicine

## 2021-12-04 DIAGNOSIS — J441 Chronic obstructive pulmonary disease with (acute) exacerbation: Secondary | ICD-10-CM

## 2021-12-04 DIAGNOSIS — F411 Generalized anxiety disorder: Secondary | ICD-10-CM

## 2021-12-10 ENCOUNTER — Other Ambulatory Visit: Payer: Self-pay | Admitting: Cardiology

## 2021-12-12 ENCOUNTER — Encounter: Payer: Self-pay | Admitting: Adult Health

## 2021-12-12 ENCOUNTER — Telehealth: Payer: Medicare HMO | Admitting: Adult Health

## 2021-12-12 DIAGNOSIS — F410 Panic disorder [episodic paroxysmal anxiety] without agoraphobia: Secondary | ICD-10-CM

## 2021-12-12 DIAGNOSIS — G4700 Insomnia, unspecified: Secondary | ICD-10-CM | POA: Diagnosis not present

## 2021-12-12 DIAGNOSIS — F411 Generalized anxiety disorder: Secondary | ICD-10-CM | POA: Diagnosis not present

## 2021-12-12 MED ORDER — QUETIAPINE FUMARATE 50 MG PO TABS: 50.0000 mg | ORAL_TABLET | Freq: Every day | ORAL | 2 refills | Status: DC

## 2021-12-12 NOTE — Progress Notes (Signed)
Dari Carpenito [id number] August 28, 1961 61 y.o.  Virtual Visit via Video Note  I connected with pt @ on 12/12/21 at  1:00 PM EST by a video enabled telemedicine application and verified that I am speaking with the correct person using two identifiers.   I discussed the limitations of evaluation and management by telemedicine and the availability of in person appointments. The patient expressed understanding and agreed to proceed.  I discussed the assessment and treatment plan with the patient. The patient was provided an opportunity to ask questions and all were answered. The patient agreed with the plan and demonstrated an understanding of the instructions.   The patient was advised to call back or seek an in-person evaluation if the symptoms worsen or if the condition fails to improve as anticipated.  I provided 15 minutes of non-face-to-face time during this encounter.  The patient was located at home.  The provider was located at [place].   Aloha Gell, NP   Subjective:   Patient ID:  Alicia Sanchez is a 61 y.o. (DOB 1961/05/02) female.  Chief Complaint: No chief complaint on file.   HPI Alicia Sanchez presents for follow-up of GAD, insomnia and panic attacks.  Describes mood today as "not much different". Pleasant. Tearful. Mood symptoms - reports depression, anxiety, and irritability. Reports panic attacks. Stating "things aren't going too good". She and husband aren't getting along well. Father in law passed away and she is still living in his house - wanting to move back home. Husband wanting her to stay at his father's house so that it remains occupied. Feels like she is struggling with sleep - laying in bed tossing and turning - then getting up and staying up most of the night. Seeing PCP for psychotropic medications. Varying interest and motivation. Taking medications as prescribed.  Energy levels lower. Active, does not have a regular exercise routine.    Enjoys some usual interests and activities. Married. Lives with husband (72rd) for 17 years. Has two daughters and 2 step daughters. Has 3 sisters and 2 brothers. Mother living - talks to her. Spending time with family. Appetite adequate. Weight gain - 172 pounds. Sleeps better some nights than others. Averages 3 to 4 hours. Denies daytime napping.  Focus and concentration difficulties. Completing tasks. Managing aspects of household. Disabled since 2020.  Denies SI or HI.  Denies AH or VH.  Previous medication trials: Paxil, Wellbutrin, Depakote, Trazadone and others   Review of Systems:  Review of Systems  Musculoskeletal:  Negative for gait problem.  Neurological:  Negative for tremors.  Psychiatric/Behavioral:         Please refer to HPI   Medications: I have reviewed the patient's current medications.  Current Outpatient Medications  Medication Sig Dispense Refill   albuterol (VENTOLIN HFA) 108 (90 Base) MCG/ACT inhaler Inhale 2 puffs into the lungs every 6 (six) hours as needed for wheezing or shortness of breath. 3 each 2   alendronate (FOSAMAX) 70 MG tablet TAKE 1 TABLET EVERY 7 DAYS WITH A FULL GLASS OF WATER ON AN EMPTY STOMACH 12 tablet 0   ALPRAZolam (XANAX) 0.5 MG tablet TAKE 1/2 TO 1 (ONE-HALF TO ONE) TABLET BY MOUTH THREE TIMES DAILY AS NEEDED FOR ANXIETY 90 tablet 0   aspirin EC 81 MG tablet Take 81 mg by mouth daily.     atorvastatin (LIPITOR) 40 MG tablet Take 1 tablet daily 30 tablet 0   Budeson-Glycopyrrol-Formoterol (BREZTRI AEROSPHERE) 160-9-4.8 MCG/ACT AERO Inhale 2 puffs into the lungs  2 (two) times daily. 10.7 g 11   busPIRone (BUSPAR) 30 MG tablet TAKE 1 TABLET TWICE DAILY 180 tablet 0   calcium carbonate (OS-CAL) 1250 (500 Ca) MG chewable tablet Chew 1 tablet (1,250 mg total) by mouth daily. 100 tablet 3   cefdinir (OMNICEF) 300 MG capsule Take 1 capsule (300 mg total) by mouth 2 (two) times daily. 1 po BID 20 capsule 0   diltiazem (CARDIZEM) 30 MG tablet  TAKE 1 TABLET TWICE DAILY AS NEEDED (NEED MD APPOINTMENT) 60 tablet 4   DULoxetine (CYMBALTA) 60 MG capsule TAKE 2 CAPSULES (120 MG TOTAL) EVERY DAY 180 capsule 0   EQ ALLERGY RELIEF, CETIRIZINE, 10 MG tablet Take 1 tablet by mouth once daily 90 tablet 1   gabapentin (NEURONTIN) 300 MG capsule TAKE 1 CAPSULE (300 MG TOTAL) BY MOUTH AT BEDTIME 90 capsule 0   ipratropium-albuterol (DUONEB) 0.5-2.5 (3) MG/3ML SOLN Take 3 mLs by nebulization every 6 (six) hours as needed. 360 mL 5   meclizine (ANTIVERT) 25 MG tablet TAKE 1 TABLET BY MOUTH THREE TIMES DAILY AS NEEDED FOR DIZZINESS 180 tablet 0   meloxicam (MOBIC) 7.5 MG tablet TAKE 1 TABLET EVERY DAY 90 tablet 0   Multiple Vitamin (MULTIVITAMIN WITH MINERALS) TABS tablet Take 1 tablet by mouth daily.     Multiple Vitamins-Minerals (VITAMIN D3 COMPLETE PO) Take 1 tablet by mouth daily.     nitroGLYCERIN (NITROSTAT) 0.4 MG SL tablet Place 1 tablet (0.4 mg total) under the tongue every 5 (five) minutes x 3 doses as needed for chest pain (if o relief after 2nd dose, proceed to the ED for an evaluation or call 911). 25 tablet 1   predniSONE (DELTASONE) 20 MG tablet 2 po at same time daily for 5 days 10 tablet 0   ramelteon (ROZEREM) 8 MG tablet Take 1 tablet (8 mg total) by mouth at bedtime. Insomnia refractory to Melatonin, Doxepin, Xanax 90 tablet 3   Respiratory Therapy Supplies (NEBULIZER/TUBING/MOUTHPIECE) KIT 1 Units by Does not apply route 4 (four) times daily. GIVE face mask and tubing for nebulizer. J44.1 1 kit 0   rOPINIRole (REQUIP) 1 MG tablet TAKE 1 TO 4 TABLETS AT BEDTIME 360 tablet 0   vitamin B-12 (CYANOCOBALAMIN) 100 MCG tablet Take 100 mcg by mouth daily.     vitamin C (ASCORBIC ACID) 250 MG tablet Take 500-1,000 mg by mouth daily.     No current facility-administered medications for this visit.    Medication Side Effects: None  Allergies:  Allergies  Allergen Reactions   Doxycycline     Chest pain    Past Medical History:   Diagnosis Date   Anxiety    COPD (chronic obstructive pulmonary disease) ([place])    Osteoporosis     Family History  Problem Relation Age of Onset   COPD Mother    COPD Father    Diabetes type II Sister    Diabetes type II Brother    COPD Maternal Grandfather     Social History   Socioeconomic History   Marital status: Married    Spouse name: Not on file   Number of children: Not on file   Years of education: Not on file   Highest education level: Not on file  Occupational History   Not on file  Tobacco Use   Smoking status: Former    Packs/day: 0.50    Years: 42.00    Pack years: 21.00    Types: Cigarettes   Smokeless tobacco: Never  Tobacco comments:    1/2 pack- 1pack a day  Vaping Use   Vaping Use: Never used  Substance and Sexual Activity   Alcohol use: No   Drug use: No   Sexual activity: Yes    Birth control/protection: Surgical  Other Topics Concern   Not on file  Social History Narrative   Not on file   Social Determinants of Health   Financial Resource Strain: Not on file  Food Insecurity: No Food Insecurity   Worried About Charity fundraiser in the Last Year: Never true   Ran Out of Food in the Last Year: Never true  Transportation Needs: Not on file  Physical Activity: Not on file  Stress: Stress Concern Present   Feeling of Stress : Rather much  Social Connections: Not on file  Intimate Partner Violence: Not At Risk   Fear of Current or Ex-Partner: No   Emotionally Abused: No   Physically Abused: No   Sexually Abused: No    Past Medical History, Surgical history, Social history, and Family history were reviewed and updated as appropriate.   Please see review of systems for further details on the patient's review from today.   Objective:   Physical Exam:  There were no vitals taken for this visit.  Physical Exam Constitutional:      General: She is not in acute distress. Musculoskeletal:        General: No deformity.   Neurological:     Mental Status: She is alert and oriented to person, place, and time.     Coordination: Coordination normal.  Psychiatric:        Attention and Perception: Attention and perception normal. She does not perceive auditory or visual hallucinations.        Mood and Affect: Mood normal. Mood is not anxious or depressed. Affect is not labile, blunt, angry or inappropriate.        Speech: Speech normal.        Behavior: Behavior normal.        Thought Content: Thought content normal. Thought content is not paranoid or delusional. Thought content does not include homicidal or suicidal ideation. Thought content does not include homicidal or suicidal plan.        Cognition and Memory: Cognition and memory normal.        Judgment: Judgment normal.     Comments: Insight intact    Lab Review:     Component Value Date/Time   NA 143 10/27/2021 1156   K 4.2 10/27/2021 1156   CL 102 10/27/2021 1156   CO2 23 10/27/2021 1156   GLUCOSE 77 10/27/2021 1156   GLUCOSE 101 (H) 08/08/2021 1355   BUN 16 10/27/2021 1156   CREATININE 0.90 10/27/2021 1156   CALCIUM 9.0 10/27/2021 1156   PROT 6.3 10/27/2021 1156   ALBUMIN 4.5 10/27/2021 1156   AST 17 10/27/2021 1156   ALT 17 10/27/2021 1156   ALKPHOS 74 10/27/2021 1156   BILITOT 0.4 10/27/2021 1156   GFRNONAA >60 08/08/2021 1355   GFRAA 91 06/03/2020 1505       Component Value Date/Time   WBC 10.3 10/27/2021 1156   WBC 12.9 (H) 08/08/2021 1355   RBC 4.93 10/27/2021 1156   RBC 5.04 08/08/2021 1355   HGB 15.3 10/27/2021 1156   HCT 45.0 10/27/2021 1156   PLT 310 10/27/2021 1156   MCV 91 10/27/2021 1156   MCH 31.0 10/27/2021 1156   MCH 31.9 08/08/2021 1355   MCHC  34.0 10/27/2021 1156   MCHC 34.7 08/08/2021 1355   RDW 12.2 10/27/2021 1156   LYMPHSABS 2.8 01/06/2020 1239   MONOABS 0.5 05/25/2018 0602   EOSABS 0.1 01/06/2020 1239   BASOSABS 0.0 01/06/2020 1239    No results found for: POCLITH, LITHIUM   No results found for:  PHENYTOIN, PHENOBARB, VALPROATE, CBMZ   .res Assessment: Plan:    Plan:  PDMP reviewed  Xanax 0.85m TID Cymbalta 1213mevery morning Buspar 3040mID Add Seroquel 23m97m hs - 1/2 tablet at bedtime x 3 nights  Gabapentin 100mg33mhs - leg/feet cramps  Refer to therapist  Will assume care of psych medications  RTC 3 months  Patient advised to contact office with any questions, adverse effects, or acute worsening in signs and symptoms.  Discussed potential benefits, risk, and side effects of benzodiazepines to include potential risk of tolerance and dependence, as well as possible drowsiness.  Advised patient not to drive if experiencing drowsiness and to take lowest possible effective dose to minimize risk of dependence and tolerance.  Diagnoses and all orders for this visit:  Generalized anxiety disorder  Panic attacks     Please see After Visit Summary for patient specific instructions.  Future Appointments  Date Time [place]  2:00 PM Dohmeier, CarmeAsencion PartridgeGNA-[facility] None  02/20/2022  1:45 PM MannaMarshell GarfinkelLBPU[facility] None  03/07/2022  8:30 AM [facility] PHARMACIST [facility] None  05/23/2022  2:30 PM GottsRonnie DossO [facility] None    No orders of the defined types were placed in this encounter.     -------------------------------

## 2021-12-14 DIAGNOSIS — J449 Chronic obstructive pulmonary disease, unspecified: Secondary | ICD-10-CM | POA: Diagnosis not present

## 2021-12-20 NOTE — Progress Notes (Signed)
Received notification from [employer] regarding approval for BREZTRI 16. Patient assistance approved UNTIL 05/24/22.  AUTO SHIPS TO PT'S HOME FROM [facility]  Phone: [phone]

## 2021-12-21 ENCOUNTER — Ambulatory Visit: Payer: Medicare HMO | Admitting: Family Medicine

## 2021-12-28 ENCOUNTER — Telehealth (INDEPENDENT_AMBULATORY_CARE_PROVIDER_SITE_OTHER): Payer: Medicare HMO | Admitting: Adult Health

## 2021-12-28 ENCOUNTER — Encounter: Payer: Self-pay | Admitting: Adult Health

## 2021-12-28 DIAGNOSIS — G4700 Insomnia, unspecified: Secondary | ICD-10-CM

## 2021-12-28 DIAGNOSIS — F331 Major depressive disorder, recurrent, moderate: Secondary | ICD-10-CM | POA: Diagnosis not present

## 2021-12-28 DIAGNOSIS — F411 Generalized anxiety disorder: Secondary | ICD-10-CM | POA: Diagnosis not present

## 2021-12-28 DIAGNOSIS — F4310 Post-traumatic stress disorder, unspecified: Secondary | ICD-10-CM | POA: Diagnosis not present

## 2021-12-28 DIAGNOSIS — F410 Panic disorder [episodic paroxysmal anxiety] without agoraphobia: Secondary | ICD-10-CM

## 2021-12-28 MED ORDER — ALPRAZOLAM 0.5 MG PO TABS: ORAL_TABLET | ORAL | 2 refills | Status: DC

## 2021-12-28 MED ORDER — QUETIAPINE FUMARATE 200 MG PO TABS: 200.0000 mg | ORAL_TABLET | Freq: Every day | ORAL | 2 refills | Status: DC

## 2021-12-28 NOTE — Progress Notes (Signed)
Alicia Sanchez [id number] 1961/10/13 61 y.o.  Virtual Visit via Video Note  I connected with pt @ on 12/28/21 at 10:20 AM EST by a video enabled telemedicine application and verified that I am speaking with the correct person using two identifiers.   I discussed the limitations of evaluation and management by telemedicine and the availability of in person appointments. The patient expressed understanding and agreed to proceed.  I discussed the assessment and treatment plan with the patient. The patient was provided an opportunity to ask questions and all were answered. The patient agreed with the plan and demonstrated an understanding of the instructions.   The patient was advised to call back or seek an in-person evaluation if the symptoms worsen or if the condition fails to improve as anticipated.  I provided 25 minutes of non-face-to-face time during this encounter.  The patient was located at home.  The provider was located at [place].   Alicia Gell, NP   Subjective:   Patient ID:  Alicia Sanchez is a 61 y.o. (DOB May 25, 1961) female.  Chief Complaint: No chief complaint on file.   HPI Alicia Sanchez presents for follow-up of GAD, MDD, PTSD, insomnia and panic attacks.  Describes mood today as "not much different". Pleasant. Tearful. Mood symptoms - reports depression, anxiety, and irritability. Reports panic attacks. Stating "I'm not doing to good". She and husband living apart. Stating "he doesn't want anything to do with me". Talking to one of her daughters - "she is supportive". Trying to take "one day at a time". Sleep is getting better with the Seroquel. Varying interest and motivation. Taking medications as prescribed.  Energy levels lower. Active, does not have a regular exercise routine.   Enjoys some usual interests and activities. Married. She and husband lives separately - married 17 years. Has two daughters and 2 step daughters. Has 3 sisters and 2  brothers. Mother living - talks to her. Spending time with family. Appetite adequate. Weight gain - 172 pounds. Sleeps better some nights than others - improving with the Seroquel - now taking 166m at bedtime. Denies daytime napping.  Focus and concentration difficulties. Completing tasks. Managing aspects of household. Disabled since 2020.  Denies SI or HI.  Denies AH or VH.  Previous medication trials: Paxil, Wellbutrin, Depakote, Trazadone and others   Review of Systems:  Review of Systems  Musculoskeletal:  Negative for gait problem.  Neurological:  Negative for tremors.  Psychiatric/Behavioral:         Please refer to HPI   Medications: I have reviewed the patient's current medications.  Current Outpatient Medications  Medication Sig Dispense Refill   ALPRAZolam (XANAX) 0.5 MG tablet Take one tablet three times daily and one extra daily as needed for increased anxiety. 120 tablet 2   albuterol (VENTOLIN HFA) 108 (90 Base) MCG/ACT inhaler Inhale 2 puffs into the lungs every 6 (six) hours as needed for wheezing or shortness of breath. 3 each 2   alendronate (FOSAMAX) 70 MG tablet TAKE 1 TABLET EVERY 7 DAYS WITH A FULL GLASS OF WATER ON AN EMPTY STOMACH 12 tablet 0   aspirin EC 81 MG tablet Take 81 mg by mouth daily.     atorvastatin (LIPITOR) 40 MG tablet Take 1 tablet daily 30 tablet 0   Budeson-Glycopyrrol-Formoterol (BREZTRI AEROSPHERE) 160-9-4.8 MCG/ACT AERO Inhale 2 puffs into the lungs 2 (two) times daily. 10.7 g 11   busPIRone (BUSPAR) 30 MG tablet TAKE 1 TABLET TWICE DAILY 180 tablet 0  calcium carbonate (OS-CAL) 1250 (500 Ca) MG chewable tablet Chew 1 tablet (1,250 mg total) by mouth daily. 100 tablet 3   cefdinir (OMNICEF) 300 MG capsule Take 1 capsule (300 mg total) by mouth 2 (two) times daily. 1 po BID 20 capsule 0   diltiazem (CARDIZEM) 30 MG tablet TAKE 1 TABLET TWICE DAILY AS NEEDED (NEED MD APPOINTMENT) 60 tablet 4   DULoxetine (CYMBALTA) 60 MG capsule TAKE 2  CAPSULES (120 MG TOTAL) EVERY DAY 180 capsule 0   EQ ALLERGY RELIEF, CETIRIZINE, 10 MG tablet Take 1 tablet by mouth once daily 90 tablet 1   gabapentin (NEURONTIN) 300 MG capsule TAKE 1 CAPSULE (300 MG TOTAL) BY MOUTH AT BEDTIME 90 capsule 0   ipratropium-albuterol (DUONEB) 0.5-2.5 (3) MG/3ML SOLN Take 3 mLs by nebulization every 6 (six) hours as needed. 360 mL 5   meclizine (ANTIVERT) 25 MG tablet TAKE 1 TABLET BY MOUTH THREE TIMES DAILY AS NEEDED FOR DIZZINESS 180 tablet 0   meloxicam (MOBIC) 7.5 MG tablet TAKE 1 TABLET EVERY DAY 90 tablet 0   Multiple Vitamin (MULTIVITAMIN WITH MINERALS) TABS tablet Take 1 tablet by mouth daily.     Multiple Vitamins-Minerals (VITAMIN D3 COMPLETE PO) Take 1 tablet by mouth daily.     nitroGLYCERIN (NITROSTAT) 0.4 MG SL tablet Place 1 tablet (0.4 mg total) under the tongue every 5 (five) minutes x 3 doses as needed for chest pain (if o relief after 2nd dose, proceed to the ED for an evaluation or call 911). 25 tablet 1   predniSONE (DELTASONE) 20 MG tablet 2 po at same time daily for 5 days 10 tablet 0   QUEtiapine (SEROQUEL) 200 MG tablet Take 1 tablet (200 mg total) by mouth at bedtime. 30 tablet 2   Respiratory Therapy Supplies (NEBULIZER/TUBING/MOUTHPIECE) KIT 1 Units by Does not apply route 4 (four) times daily. GIVE face mask and tubing for nebulizer. J44.1 1 kit 0   rOPINIRole (REQUIP) 1 MG tablet TAKE 1 TO 4 TABLETS AT BEDTIME 360 tablet 0   vitamin B-12 (CYANOCOBALAMIN) 100 MCG tablet Take 100 mcg by mouth daily.     vitamin C (ASCORBIC ACID) 250 MG tablet Take 500-1,000 mg by mouth daily.     No current facility-administered medications for this visit.    Medication Side Effects: None  Allergies:  Allergies  Allergen Reactions   Doxycycline     Chest pain    Past Medical History:  Diagnosis Date   Anxiety    COPD (chronic obstructive pulmonary disease) ([place])    Osteoporosis     Family History  Problem Relation Age of Onset   COPD  Mother    COPD Father    Diabetes type II Sister    Diabetes type II Brother    COPD Maternal Grandfather     Social History   Socioeconomic History   Marital status: Married    Spouse name: Not on file   Number of children: Not on file   Years of education: Not on file   Highest education level: Not on file  Occupational History   Not on file  Tobacco Use   Smoking status: Former    Packs/day: 0.50    Years: 42.00    Pack years: 21.00    Types: Cigarettes   Smokeless tobacco: Never   Tobacco comments:    1/2 pack- 1pack a day  Vaping Use   Vaping Use: Never used  Substance and Sexual Activity   Alcohol use:  No   Drug use: No   Sexual activity: Yes    Birth control/protection: Surgical  Other Topics Concern   Not on file  Social History Narrative   Not on file   Social Determinants of Health   Financial Resource Strain: Not on file  Food Insecurity: No Food Insecurity   Worried About Charity fundraiser in the Last Year: Never true   Ran Out of Food in the Last Year: Never true  Transportation Needs: Not on file  Physical Activity: Not on file  Stress: Stress Concern Present   Feeling of Stress : Rather much  Social Connections: Not on file  Intimate Partner Violence: Not At Risk   Fear of Current or Ex-Partner: No   Emotionally Abused: No   Physically Abused: No   Sexually Abused: No    Past Medical History, Surgical history, Social history, and Family history were reviewed and updated as appropriate.   Please see review of systems for further details on the patient's review from today.   Objective:   Physical Exam:  There were no vitals taken for this visit.  Physical Exam Constitutional:      General: She is not in acute distress. Musculoskeletal:        General: No deformity.  Neurological:     Mental Status: She is alert and oriented to person, place, and time.     Coordination: Coordination normal.  Psychiatric:        Attention and  Perception: Attention and perception normal. She does not perceive auditory or visual hallucinations.        Mood and Affect: Mood normal. Mood is not anxious or depressed. Affect is not labile, blunt, angry or inappropriate.        Speech: Speech normal.        Behavior: Behavior normal.        Thought Content: Thought content normal. Thought content is not paranoid or delusional. Thought content does not include homicidal or suicidal ideation. Thought content does not include homicidal or suicidal plan.        Cognition and Memory: Cognition and memory normal.        Judgment: Judgment normal.     Comments: Insight intact    Lab Review:     Component Value Date/Time   NA 143 10/27/2021 1156   K 4.2 10/27/2021 1156   CL 102 10/27/2021 1156   CO2 23 10/27/2021 1156   GLUCOSE 77 10/27/2021 1156   GLUCOSE 101 (H) 08/08/2021 1355   BUN 16 10/27/2021 1156   CREATININE 0.90 10/27/2021 1156   CALCIUM 9.0 10/27/2021 1156   PROT 6.3 10/27/2021 1156   ALBUMIN 4.5 10/27/2021 1156   AST 17 10/27/2021 1156   ALT 17 10/27/2021 1156   ALKPHOS 74 10/27/2021 1156   BILITOT 0.4 10/27/2021 1156   GFRNONAA >60 08/08/2021 1355   GFRAA 91 06/03/2020 1505       Component Value Date/Time   WBC 10.3 10/27/2021 1156   WBC 12.9 (H) 08/08/2021 1355   RBC 4.93 10/27/2021 1156   RBC 5.04 08/08/2021 1355   HGB 15.3 10/27/2021 1156   HCT 45.0 10/27/2021 1156   PLT 310 10/27/2021 1156   MCV 91 10/27/2021 1156   MCH 31.0 10/27/2021 1156   MCH 31.9 08/08/2021 1355   MCHC 34.0 10/27/2021 1156   MCHC 34.7 08/08/2021 1355   RDW 12.2 10/27/2021 1156   LYMPHSABS 2.8 01/06/2020 1239   MONOABS 0.5 05/25/2018 0602  EOSABS 0.1 01/06/2020 1239   BASOSABS 0.0 01/06/2020 1239    No results found for: POCLITH, LITHIUM   No results found for: PHENYTOIN, PHENOBARB, VALPROATE, CBMZ   .res Assessment: Plan:    Plan:  PDMP reviewed  Xanax 0.38m TID Cymbalta 1244mevery morning Buspar 3051mBID Seroquel 150m78m 200mg47mhs  Gabapentin 100mg 41ms - leg/feet cramps  Refer to therapist  RTC 3 months  Patient advised to contact office with any questions, adverse effects, or acute worsening in signs and symptoms.  Discussed potential benefits, risk, and side effects of benzodiazepines to include potential risk of tolerance and dependence, as well as possible drowsiness. Advised patient not to drive if experiencing drowsiness and to take lowest possible effective dose to minimize risk of dependence and tolerance.   Diagnoses and all orders for this visit:  Generalized anxiety disorder -     QUEtiapine (SEROQUEL) 200 MG tablet; Take 1 tablet (200 mg total) by mouth at bedtime. -     ALPRAZolam (XANAX) 0.5 MG tablet; Take one tablet three times daily and one extra daily as needed for increased anxiety.  Panic attacks -     QUEtiapine (SEROQUEL) 200 MG tablet; Take 1 tablet (200 mg total) by mouth at bedtime. -     ALPRAZolam (XANAX) 0.5 MG tablet; Take one tablet three times daily and one extra daily as needed for increased anxiety.  Insomnia, unspecified type -     QUEtiapine (SEROQUEL) 200 MG tablet; Take 1 tablet (200 mg total) by mouth at bedtime.  PTSD (post-traumatic stress disorder)  Moderate episode of recurrent major depressive disorder (HCC)  [place]blePlease see After Visit Summary for patient specific instructions.  Future Appointments  Date [place]  1:45 PM [facility] None  03/07/2022  8:30 AM [facility] PHARMACIST [facility] None  05/23/2022  2:30 PM GottscRonnie Doss [facility] None    No orders of the defined types were placed in this encounter.     -------------------------------

## 2022-01-03 ENCOUNTER — Encounter: Payer: Self-pay | Admitting: *Deleted

## 2022-01-09 ENCOUNTER — Telehealth: Payer: Self-pay | Admitting: Family Medicine

## 2022-01-09 DIAGNOSIS — J441 Chronic obstructive pulmonary disease with (acute) exacerbation: Secondary | ICD-10-CM

## 2022-01-09 NOTE — Telephone Encounter (Signed)
Patient needs a mask kit for her nebulizer sent to [facility]. Please fax order to [phone]

## 2022-01-10 ENCOUNTER — Other Ambulatory Visit: Payer: Self-pay | Admitting: Family Medicine

## 2022-01-10 DIAGNOSIS — J432 Centrilobular emphysema: Secondary | ICD-10-CM

## 2022-01-10 NOTE — Telephone Encounter (Signed)
Please print and send to [facility].  The printer on our side is broken.

## 2022-01-10 NOTE — Telephone Encounter (Signed)
Lm letting pt know I faxed order

## 2022-01-10 NOTE — Addendum Note (Signed)
Addended by: Fara Olden on: 01/10/2022 12:46 PM   Modules accepted: Orders

## 2022-01-10 NOTE — Telephone Encounter (Signed)
Faxing dme order to [facility]

## 2022-01-14 DIAGNOSIS — J449 Chronic obstructive pulmonary disease, unspecified: Secondary | ICD-10-CM | POA: Diagnosis not present

## 2022-01-15 ENCOUNTER — Ambulatory Visit: Payer: Medicare HMO | Admitting: Neurology

## 2022-02-01 ENCOUNTER — Other Ambulatory Visit: Payer: Self-pay | Admitting: Pulmonary Disease

## 2022-02-07 ENCOUNTER — Other Ambulatory Visit: Payer: Self-pay | Admitting: Family Medicine

## 2022-02-07 DIAGNOSIS — J441 Chronic obstructive pulmonary disease with (acute) exacerbation: Secondary | ICD-10-CM

## 2022-02-11 DIAGNOSIS — J449 Chronic obstructive pulmonary disease, unspecified: Secondary | ICD-10-CM | POA: Diagnosis not present

## 2022-02-20 ENCOUNTER — Ambulatory Visit: Payer: Medicare HMO | Admitting: Pulmonary Disease

## 2022-03-02 ENCOUNTER — Other Ambulatory Visit: Payer: Self-pay | Admitting: Family Medicine

## 2022-03-02 DIAGNOSIS — J302 Other seasonal allergic rhinitis: Secondary | ICD-10-CM

## 2022-03-07 ENCOUNTER — Telehealth: Payer: Medicare HMO

## 2022-03-07 ENCOUNTER — Telehealth: Payer: Self-pay | Admitting: Pulmonary Disease

## 2022-03-07 NOTE — Telephone Encounter (Signed)
Called and spoke with patient.  Patient has requested her 6 month follow up appointment to be made a my chart visit.  Patient denies any changes and stated she does have a smart phone.  Patient stated my chart visit would be more convenient for her that week.  OV changed to my chart visit.  Nothing further at this time. ?

## 2022-03-09 ENCOUNTER — Ambulatory Visit (INDEPENDENT_AMBULATORY_CARE_PROVIDER_SITE_OTHER): Payer: Medicare HMO | Admitting: Pharmacist

## 2022-03-09 DIAGNOSIS — Z720 Tobacco use: Secondary | ICD-10-CM

## 2022-03-09 DIAGNOSIS — J410 Simple chronic bronchitis: Secondary | ICD-10-CM

## 2022-03-09 DIAGNOSIS — J439 Emphysema, unspecified: Secondary | ICD-10-CM

## 2022-03-13 ENCOUNTER — Ambulatory Visit: Payer: Medicare HMO | Admitting: Pulmonary Disease

## 2022-03-13 ENCOUNTER — Encounter: Payer: Self-pay | Admitting: Pulmonary Disease

## 2022-03-13 ENCOUNTER — Telehealth (INDEPENDENT_AMBULATORY_CARE_PROVIDER_SITE_OTHER): Payer: Medicare HMO | Admitting: Pulmonary Disease

## 2022-03-13 DIAGNOSIS — J449 Chronic obstructive pulmonary disease, unspecified: Secondary | ICD-10-CM | POA: Diagnosis not present

## 2022-03-13 MED ORDER — PREDNISONE 20 MG PO TABS: ORAL_TABLET | ORAL | 3 refills | Status: DC

## 2022-03-13 MED ORDER — AZITHROMYCIN 250 MG PO TABS: ORAL_TABLET | ORAL | 3 refills | Status: DC

## 2022-03-13 NOTE — Progress Notes (Signed)
? ?      ?Alicia Sanchez    [id number]    06/18/1961 ? ?Primary Care Physician:Gottschalk, Koleen Distance, DO ? ?Referring Physician: Janora Norlander, DO ?[address] [place],  [place] [address] ? ? ?Virtual Visit via Video Note ? ?I connected with Alicia Sanchez on 03/13/22 at 11:15 AM EDT by a video enabled telemedicine application and verified that I am speaking with the correct person using two identifiers. ? ?Location: ?Patient: Home  ?Provider: [phone] ?  ?I discussed the limitations of evaluation and management by telemedicine and the availability of in person appointments. The patient expressed understanding and agreed to proceed. ? ?Chief complaint:   ?Follow-up for COPD ? ?HPI: ?Alicia Sanchez is a 61 year old active smoker with history of anxiety, depression, COPD.  ?Initially treated with Memory Dance and Incruse which was consolidated to Trelegy inhaler in 2018 ? ?She has daily symptoms of snoring. She does sleep study 4 years ago but does not know the result of this test. She is a diagnosis of restless leg syndrome and is on requip. She did not have the repeat sleep study done as it was not covered by insurance. ? ?Pets:Dog which lives outside the house. No pets, exotic pets. ?Occupation:Customer sevice rep. previously worked in [employer] ?Exposures: His current dust exposure and previous exposure to cotton fiber in her line of work ?Smoking history: 35-pack-year smoking history. Continues to smoke 1 pack per day ?She quit smoking in 2021 but unfortunately started again due to family stresses ? ?Interim history: ?Continues on breztri inhaler.  States that she has a mild increased cough, sputum production and dyspnea.  No wheezing. ? ?Continues to smoke due to personal stressors at home. ? ?Outpatient Encounter Medications as of 03/13/2022  ?Medication Sig  ? albuterol (VENTOLIN HFA) 108 (90 Base) MCG/ACT inhaler INHALE 2 PUFFS INTO THE LUNGS EVERY 6 (SIX) HOURS AS NEEDED FOR WHEEZING OR SHORTNESS OF  BREATH.  ? alendronate (FOSAMAX) 70 MG tablet TAKE 1 TABLET EVERY 7 DAYS WITH A FULL GLASS OF WATER ON AN EMPTY STOMACH  ? ALPRAZolam (XANAX) 0.5 MG tablet Take one tablet three times daily and one extra daily as needed for increased anxiety.  ? aspirin EC 81 MG tablet Take 81 mg by mouth daily.  ? atorvastatin (LIPITOR) 40 MG tablet Take 1 tablet daily  ? Budeson-Glycopyrrol-Formoterol (BREZTRI AEROSPHERE) 160-9-4.8 MCG/ACT AERO Inhale 2 puffs into the lungs 2 (two) times daily.  ? busPIRone (BUSPAR) 30 MG tablet TAKE 1 TABLET TWICE DAILY  ? calcium carbonate (OS-CAL) 1250 (500 Ca) MG chewable tablet Chew 1 tablet (1,250 mg total) by mouth daily.  ? diltiazem (CARDIZEM) 30 MG tablet TAKE 1 TABLET TWICE DAILY AS NEEDED (NEED MD APPOINTMENT)  ? DULoxetine (CYMBALTA) 60 MG capsule TAKE 2 CAPSULES (120 MG TOTAL) EVERY DAY  ? EQ ALLERGY RELIEF, CETIRIZINE, 10 MG tablet Take 1 tablet by mouth once daily  ? FOLIC ACID PO Take 1 tablet by mouth daily.  ? gabapentin (NEURONTIN) 300 MG capsule TAKE 1 CAPSULE (300 MG TOTAL) BY MOUTH AT BEDTIME  ? ipratropium-albuterol (DUONEB) 0.5-2.5 (3) MG/3ML SOLN Take 3 mLs by nebulization every 6 (six) hours as needed.  ? meclizine (ANTIVERT) 25 MG tablet TAKE 1 TABLET BY MOUTH THREE TIMES DAILY AS NEEDED FOR DIZZINESS  ? meloxicam (MOBIC) 7.5 MG tablet TAKE 1 TABLET EVERY DAY  ? Multiple Vitamin (MULTIVITAMIN WITH MINERALS) TABS tablet Take 1 tablet by mouth daily.  ? Multiple Vitamins-Minerals (VITAMIN  D3 COMPLETE PO) Take 1 tablet by mouth daily.  ? QUEtiapine (SEROQUEL) 200 MG tablet Take 1 tablet (200 mg total) by mouth at bedtime.  ? Respiratory Therapy Supplies (NEBULIZER/TUBING/MOUTHPIECE) KIT 1 Units by Does not apply route 4 (four) times daily. GIVE face mask and tubing for nebulizer. J44.1  ? rOPINIRole (REQUIP) 1 MG tablet TAKE 1 TO 4 TABLETS AT BEDTIME  ? vitamin B-12 (CYANOCOBALAMIN) 100 MCG tablet Take 100 mcg by mouth daily.  ? vitamin C (ASCORBIC ACID) 250 MG tablet Take  500-1,000 mg by mouth daily.  ? nitroGLYCERIN (NITROSTAT) 0.4 MG SL tablet Place 1 tablet (0.4 mg total) under the tongue every 5 (five) minutes x 3 doses as needed for chest pain (if o relief after 2nd dose, proceed to the ED for an evaluation or call 911). (Patient not taking: Reported on 03/13/2022)  ? [DISCONTINUED] cefdinir (OMNICEF) 300 MG capsule Take 1 capsule (300 mg total) by mouth 2 (two) times daily. 1 po BID  ? [DISCONTINUED] predniSONE (DELTASONE) 20 MG tablet 2 po at same time daily for 5 days  ? ?No facility-administered encounter medications on file as of 03/13/2022.  ? ?Physical Exam: ?There were no vitals taken for this visit. ?Gen:      No acute distress ?HEENT:  EOMI, sclera anicteric ?Neck:     No masses; no thyromegaly ?Lungs:    Clear to auscultation bilaterally; normal respiratory effort ?CV:         Regular rate and rhythm; no murmurs ?Abd:      + bowel sounds; soft, non-tender; no palpable masses, no distension ?Ext:    No edema; adequate peripheral perfusion ?Skin:      Warm and dry; no rash ?Neuro: alert and oriented x 3 ?Psych: normal mood and affect  ? ?Data Reviewed: ?Imaging ?Screening CT chest 05/08/17-centrilobular emphysema, calcified granuloma, tiny subcentimeter pulmonary nodules. ?CTA 05/21/2018- emphysema, scattered pulmonary nodule.  4 mm right lower lung nodule is new. ?I have reviewed the images personally ? ?CTA [facility]) 03/11/2019-no pulmonary embolism.  No active cardiopulmonary disease.  Coronary artery calcification ?No images available in PACS ? ?Screening CT 12/17/2019-emphysema, tiny calcified pulmonary nodules ? ?CTA 02/09/2020-no PE, lungs are clear with emphysema. ? ?Screening CT 04/03/2021-emphysema, stable pulmonary nodules. ?I have reviewed the images personally. ? ?PFTs  ?07/24/17 ?FVC 2.54 (74%], FEV1 1.55 (58%], F/F 61, TLC 103%, DLCO 64% ?Moderate obstruction and diffusion impairment with air trapping ? ?08/18/2018 ?FVC 2.26 [70%], FEV1 1.23 [49%], F/F 54, TLC  5.98 [123%], DLCO 12.55 [56%] ?Severe obstructive airway disease.  Worse compared to 2018 ? ?Labs ?CBC/1/17-absolute eosinophil count 0 ?Alpha-1 antitrypsin 04/25/2017-136, PI MM ? ?Assessment:  ?COPD, chronic bronchitis ?Currently on breztri, albuterol ?Prescribe Z-Pak and prednisone 40 mg a day for 5 days for mild exacerbation ?Nebs as needed ? ?Active smoker ?Unfortunately started smoking again ?We discussed smoking cessation and she wants to attempt again to quit ?Referral has been made to cessation clinic ?Time spent counseling-5 minutes.  Reassess at return visit. ? ?Continue low-dose screening CT ?  ?Restless leg syndrome, suspected sleep apnea ?On requip for restless leg syndrome.  ?Sleep study not done as it is not covered by insurance. ? ?Plan/Recommendations: ?- Continue breztri duo nebs as needed ?- Low dose screening CT of the chest ?- Smoking cessation ? ?  ?The patient was advised to call back or seek an in-person evaluation if the symptoms worsen or if the condition fails to improve as anticipated. ? ?I provided 25  minutes of non-face-to-face time during this encounter. ? ?Marshell Garfinkel MD ?[facility] ?03/13/2022, 11:23 AM ? ?CC: Janora Norlander, DO ? ?  ? ?Marshell Garfinkel, MD  ?

## 2022-03-14 DIAGNOSIS — J449 Chronic obstructive pulmonary disease, unspecified: Secondary | ICD-10-CM | POA: Diagnosis not present

## 2022-03-14 MED ORDER — BREZTRI AEROSPHERE 160-9-4.8 MCG/ACT IN AERO: 2.0000 | INHALATION_SPRAY | Freq: Two times a day (BID) | RESPIRATORY_TRACT | 11 refills | Status: DC

## 2022-03-14 NOTE — Progress Notes (Signed)
? ? ?Chronic Care Management ?Pharmacy Note ? ?03/09/2022 ?Name:  Alicia Sanchez MRN:  [id number] DOB:  Mar 10, 1961 ? ?Summary: ?Current Barriers:  ?Unable to independently afford treatment regimen ?Unable to achieve control of COPD  ? ?Pharmacist Clinical Goal(s):  ?Over the next 90 days, patient will verbalize ability to afford treatment regimen ?achieve control of COPD as evidenced by [place] through collaboration with PharmD and provider.  ? ? ?Interventions: ?1:1 collaboration with Janora Norlander, DO regarding development and update of comprehensive plan of care as evidenced by provider attestation and co-signature ?Inter-disciplinary care team collaboration (see longitudinal plan of care) ?Comprehensive medication review performed; medication list updated in electronic medical record ? ?Chronic Obstructive Pulmonary Disease: ?Uncontrolled; current treatment: Breztri ?Continues on Breztri, nebs as needed, RESCUE inhaler 1-2 TIMES DAILY ?Reviewed Breztri dosing--> 2 puffs twice daily ?We have switched to breztri due to ease of patient assistance ?RE-ENROLLMENT SUBMITTED FOR 2023 AZ&ME PATIENT ASSISTANCE--refill sent today ?She quit smoking in 2021 but unfortunately started again due to family stresses ?GOLD Classification: II ?Smoking history: 35-pack-year smoking history.  ?Active smoker ?Discussed smoking cessation.  She continues to try decreasing the amount of cigarettes ?She is not taking Chantix, she is not wearing patches ?She remains willing to try to quit; NOT READY ?Continue to counsel ?Most recent Pulmonary Function Testing:  ?Pulmonary Functions Testing Results: 2019 ? ?TLC  ?Date Value Ref Range Status  ?08/18/2018 5.98 L Final  ? ? ?Educated on proper use of Breztri since recent transition from [place] to [place] ?Assessed patient finances. Patient to qualify for the AZ&[place] patient assistance; RE-ENROLLMENT SUBMITTED FOR 2023 AZ&ME PATIENT  ASSISTANCE, will ship to patient's home VIA [place] ? ? ?Subjective: ?Alicia Sanchez is an 61 y.o. year old female who is a primary patient of Janora Norlander, DO.  The CCM team was consulted for assistance with disease management and care coordination needs.   ? ?Engaged with patient by telephone for follow up visit in response to provider referral for pharmacy case management and/or care coordination services.  ? ?Consent to Services:  ?The patient was given information about Chronic Care Management services, agreed to services, and gave verbal consent prior to initiation of services.  Please see initial visit note for detailed documentation.  ? ?Patient Care Team: ?Janora Norlander, DO as PCP - General (Family Medicine) ?Arnoldo Lenis, MD as PCP - Cardiology (Cardiology) ?Marshell Garfinkel, MD as Consulting Physician (Pulmonary Disease) ?Lavera Guise, [facility] (Pharmacist) ?Mozingo, Berdie Ogren, NP as Nurse Practitioner (Psychiatry) ? ? ?Objective: ? ?Lab Results  ?Component Value Date  ? CREATININE 0.90 10/27/2021  ? CREATININE 0.82 08/08/2021  ? CREATININE 0.72 07/19/2021  ? ? ?No results found for: HGBA1C ?Last diabetic Eye exam: No results found for: HMDIABEYEEXA  ?Last diabetic Foot exam: No results found for: HMDIABFOOTEX  ? ?   ?Component Value Date/Time  ? CHOL 134 10/27/2021 1156  ? TRIG 138 10/27/2021 1156  ? HDL 62 10/27/2021 1156  ? CHOLHDL 2.2 10/27/2021 1156  ? [place] 48 10/27/2021 1156  ? ? ? ?  Latest Ref Rng & Units 10/27/2021  ? 11:56 AM 06/03/2020  ?  3:05 PM 01/06/2020  ? 12:39 PM  ?Hepatic Function  ?Total Protein 6.0 - 8.5 g/dL 6.3   6.3   7.0    ?Albumin 3.8 - 4.8 g/dL 4.5   4.3   4.6    ?  AST 0 - 40 IU/L '17   23   21    ' ?ALT 0 - 32 IU/L '17   16   14    ' ?Alk Phosphatase 44 - 121 IU/L 74   76   80    ?Total Bilirubin 0.0 - 1.2 mg/dL 0.4   0.2   0.3    ? ? ?Lab Results  ?Component Value Date/Time  ? TSH 1.930 10/27/2021  11:56 AM  ? TSH 1.020 06/03/2020 03:05 PM  ? ? ? ?  Latest Ref Rng & Units 10/27/2021  ? 11:56 AM 08/08/2021  ?  1:55 PM 06/03/2020  ?  3:05 PM  ?CBC  ?WBC 3.4 - 10.8 x10E3/uL 10.3   12.9   7.4    ?Hemoglobin 11.1 - 15.9 g/dL 15.3   16.1   14.9    ?Hematocrit 34.0 - 46.6 % 45.0   46.4   44.5    ?Platelets 150 - 450 x10E3/uL 310   274   318    ? ? ?Lab Results  ?Component Value Date/Time  ? VD25OH 30.4 10/27/2021 11:56 AM  ? ? ?Clinical ASCVD: No  ?The 10-year ASCVD risk score (Arnett DK, et al., 2019) is: 2.3% ?  Values used to calculate the score: ?    Age: 58 years ?    Sex: Female ?    Is Non-Hispanic African American: No ?    Diabetic: No ?    Tobacco smoker: No ?    Systolic Blood Pressure: 681 mmHg ?    Is BP treated: Yes ?    HDL Cholesterol: 62 mg/dL ?    Total Cholesterol: 134 mg/dL   ? ?Other: (CHADS2VASc if Afib, PHQ9 if depression, MMRC or CAT for COPD, ACT, DEXA) ? ?Social History  ? ?Tobacco Use  ?Smoking Status Former  ? Packs/day: 0.50  ? Years: 42.00  ? Pack years: 21.00  ? Types: Cigarettes  ?Smokeless Tobacco Never  ?Tobacco Comments  ? 1/2 pack- 1pack a day  ? ?BP Readings from Last 3 Encounters:  ?11/21/21 104/71  ?10/25/21 127/82  ?08/30/21 122/78  ? ?Pulse Readings from Last 3 Encounters:  ?11/21/21 93  ?10/25/21 (!) 112  ?08/30/21 (!) 108  ? ?Wt Readings from Last 3 Encounters:  ?11/21/21 166 lb (75.3 kg)  ?10/25/21 163 lb 6.4 oz (74.1 kg)  ?08/30/21 163 lb 3.2 oz (74 kg)  ? ? ?Assessment: Review of patient past medical history, allergies, medications, health status, including review of consultants reports, laboratory and other test data, was performed as part of comprehensive evaluation and provision of chronic care management services.  ? ?SDOH:  (Social Determinants of Health) assessments and interventions performed:  ? ? ?CCM Care Plan ? ?Allergies  ?Allergen Reactions  ? Doxycycline   ?  Chest pain  ? ? ?Medications Reviewed Today   ? ? Reviewed by Lavera Guise, [facility] (Pharmacist) on  03/14/22 at 1539  Med List Status: <None>  ? ?Medication Order Taking? Sig Documenting Provider Last Dose Status Informant  ?albuterol (VENTOLIN HFA) 108 (90 Base) MCG/ACT inhaler [id number] No INHALE 2 PUFFS INTO THE LUNGS EVERY 6 (SIX) HOURS AS NEEDED FOR WHEEZING OR SHORTNESS OF BREATH. Marshell Garfinkel, MD Taking Active   ?alendronate (FOSAMAX) 70 MG tablet [id number] No TAKE 1 TABLET EVERY 7 DAYS WITH A FULL GLASS OF WATER ON AN EMPTY STOMACH Ronnie Doss M, DO Taking Active   ?ALPRAZolam (XANAX) 0.5 MG tablet [id number] No Take one tablet three times daily  and one extra daily as needed for increased anxiety. Mozingo, Berdie Ogren, NP Taking Active   ?aspirin EC 81 MG tablet [id number] No Take 81 mg by mouth daily. [provider] Taking Active Self  ?atorvastatin (LIPITOR) 40 MG tablet [id number] No Take 1 tablet daily Branch, Alphonse Guild, MD Taking Active   ?azithromycin (ZITHROMAX) 250 MG tablet [id number]  Take as directed Marshell Garfinkel, MD  Active   ?Budeson-Glycopyrrol-Formoterol (BREZTRI AEROSPHERE) 160-9-4.8 MCG/ACT AERO [id number]  Inhale 2 puffs into the lungs 2 (two) times daily. Please fill #3 inahlers for 3 month supply Ronnie Doss M, DO  Active   ?busPIRone (BUSPAR) 30 MG tablet [id number] No TAKE 1 TABLET TWICE DAILY Ronnie Doss M, DO Taking Active   ?calcium carbonate (OS-CAL) 1250 (500 Ca) MG chewable tablet [id number] No Chew 1 tablet (1,250 mg total) by mouth daily. Janora Norlander, DO Taking Active Self  ?diltiazem (CARDIZEM) 30 MG tablet [id number] No TAKE 1 TABLET TWICE DAILY AS NEEDED (NEED MD APPOINTMENT) Harl Bowie Alphonse Guild, MD Taking Active   ?DULoxetine (CYMBALTA) 60 MG capsule [id number] No TAKE 2 CAPSULES (120 MG TOTAL) EVERY DAY Janora Norlander, DO Taking Active   ?EQ ALLERGY RELIEF, CETIRIZINE, 10 MG tablet [id number] No Take 1 tablet by mouth once daily Ronnie Doss M, DO Taking Active   ?FOLIC ACID PO [id number] No Take 1 tablet by mouth daily.  [provider] Taking Active   ?gabapentin (NEURONTIN) 300 MG capsule [id number] No TAKE 1 CAPSULE (300 MG TOTAL) BY MOUTH AT BEDTIME Ronnie Doss M, DO Taking Active   ?ipratropium-albuterol (DUONEB) 0.5-2.

## 2022-03-14 NOTE — Patient Instructions (Signed)
Visit Information ? ?Following are the goals we discussed today:  ?Current Barriers:  ?Unable to independently afford treatment regimen ?Unable to achieve control of COPD  ? ?Pharmacist Clinical Goal(s):  ?Over the next 90 days, patient will verbalize ability to afford treatment regimen ?achieve control of COPD as evidenced by DECREASED USE OF RESCUE INHALER, IMPROVED QUALITY OF LIFE through collaboration with PharmD and provider.  ? ? ?Interventions: ?1:1 collaboration with [name], DO regarding development and update of comprehensive plan of care as evidenced by provider attestation and co-signature ?Inter-disciplinary care team collaboration (see longitudinal plan of care) ?Comprehensive medication review performed; medication list updated in electronic medical record ? ?Chronic Obstructive Pulmonary Disease: ?Uncontrolled; current treatment: Breztri ?Continues on Breztri, nebs as needed, RESCUE inhaler 1-2 TIMES DAILY ?Reviewed Breztri dosing--> 2 puffs twice daily ?We have switched to breztri due to ease of patient assistance ?RE-ENROLLMENT SUBMITTED FOR 2023 AZ&ME PATIENT ASSISTANCE--refill sent today ?She quit smoking in 2021 but unfortunately started again due to family stresses ?GOLD Classification: II ?Smoking history: 35-pack-year smoking history.  ?Active smoker ?Discussed smoking cessation.  She continues to try decreasing the amount of cigarettes ?She is not taking Chantix, she is not wearing patches ?She remains willing to try to quit; NOT READY ?Continue to counsel ?Most recent Pulmonary Function Testing:  ?Pulmonary Functions Testing Results: 2019 ? ?TLC  ?Date Value Ref Range Status  ?08/18/2018 5.98 L Final  ? ? ?Educated on proper use of Breztri since recent transition from [place] to [place] ?Assessed patient finances. Patient to qualify for the AZ&me patient assistance; RE-ENROLLMENT SUBMITTED FOR 2023 AZ&ME PATIENT ASSISTANCE, will ship to patient's home VIA [employer] MAIL ORDER ON  BEHALF OF THE AZ&ME PATIENT ASSISTANCE PROGRAM ? ? ?Patient Goals/Self-Care Activities ?Over the next 90 days, patient will:  ?- take medications as prescribed ?Work to decrease the amount of cigarretes per day; decrease amount of rescue inhaler as able ? ?Follow Up Plan: Telephone follow up appointment with care management team member scheduled for: 3 months ? ? ?Plan: Telephone follow up appointment with care management team member scheduled for:  09/2022 ? ?Signature ?Kieth Brightly, PharmD, BCPS ?Clinical Pharmacist, [facility] ?[place] Phone [phone] ? ? ?Please call the care guide team at [phone] if you need to cancel or reschedule your appointment.  ? ?The patient verbalized understanding of instructions, educational materials, and care plan provided today and declined offer to receive copy of patient instructions, educational materials, and care plan.  ? ?

## 2022-03-15 ENCOUNTER — Ambulatory Visit: Payer: Medicare HMO | Admitting: Family Medicine

## 2022-03-15 ENCOUNTER — Telehealth (INDEPENDENT_AMBULATORY_CARE_PROVIDER_SITE_OTHER): Payer: Medicare HMO | Admitting: Family Medicine

## 2022-03-15 DIAGNOSIS — M5136 Other intervertebral disc degeneration, lumbar region: Secondary | ICD-10-CM

## 2022-03-15 DIAGNOSIS — M79605 Pain in left leg: Secondary | ICD-10-CM

## 2022-03-15 DIAGNOSIS — M1990 Unspecified osteoarthritis, unspecified site: Secondary | ICD-10-CM | POA: Diagnosis not present

## 2022-03-15 DIAGNOSIS — Z630 Problems in relationship with spouse or partner: Secondary | ICD-10-CM

## 2022-03-15 DIAGNOSIS — M51369 Other intervertebral disc degeneration, lumbar region without mention of lumbar back pain or lower extremity pain: Secondary | ICD-10-CM

## 2022-03-15 DIAGNOSIS — F331 Major depressive disorder, recurrent, moderate: Secondary | ICD-10-CM | POA: Diagnosis not present

## 2022-03-15 DIAGNOSIS — M79604 Pain in right leg: Secondary | ICD-10-CM

## 2022-03-15 DIAGNOSIS — F411 Generalized anxiety disorder: Secondary | ICD-10-CM

## 2022-03-15 DIAGNOSIS — G2581 Restless legs syndrome: Secondary | ICD-10-CM

## 2022-03-15 MED ORDER — ATORVASTATIN CALCIUM 40 MG PO TABS: ORAL_TABLET | ORAL | 3 refills | Status: DC

## 2022-03-15 MED ORDER — ALENDRONATE SODIUM 70 MG PO TABS: ORAL_TABLET | ORAL | 3 refills | Status: DC

## 2022-03-15 MED ORDER — GABAPENTIN 300 MG PO CAPS: 300.0000 mg | ORAL_CAPSULE | Freq: Every day | ORAL | 3 refills | Status: DC

## 2022-03-15 MED ORDER — ROPINIROLE HCL 1 MG PO TABS: 1.0000 mg | ORAL_TABLET | Freq: Every day | ORAL | 3 refills | Status: DC

## 2022-03-15 MED ORDER — MELOXICAM 7.5 MG PO TABS: 7.5000 mg | ORAL_TABLET | Freq: Every day | ORAL | 3 refills | Status: DC

## 2022-03-15 NOTE — Progress Notes (Signed)
MyChart Video visit ? ?Subjective: ?CC: Medication renewal ?PCP: Alicia Norlander, DO ?POE:UMPNTI [name] is a 61 y.o. female. Patient provides verbal consent for consult held via video. ? ?Due to COVID-19 pandemic this visit was conducted virtually. This visit type was conducted due to national recommendations for restrictions regarding the COVID-19 Pandemic (e.g. social distancing, sheltering in place) in an effort to limit this patient's exposure and mitigate transmission in our community. All issues noted in this document were discussed and addressed.  A physical exam was not performed with this format.  ? ?Location of patient: Home ?Location of provider: [facility] ?Others present for call: Spouse ? ?1.  Chronic pain ?Patient needs refills on meloxicam, gabapentin.  She notes that symptoms are stable and keep her functional.  Does not report any excessive daytime sedation with these meds ? ?2.  Anxiety, stress ?Patient reports exacerbation of anxiety and stress.  She and her spouse are separating and it sounds like he is the one that has requested the separation.  She unfortunately is taking this pretty hard.  He is no longer residing in the home but apparently multiple people have been coming by the house to retrieve things.  She has not been pleased with this because strangers are just showing up to her home.  She is compliant with all medications as outlined by her psychiatrist but does not have a therapist and has had difficulty securing a therapist with her psychiatrist's office.  She would like some assistance in arranging this. ? ? ?ROS: Per HPI ? ?Allergies  ?Allergen Reactions  ? Doxycycline   ?  Chest pain  ? ?Past Medical History:  ?Diagnosis Date  ? Anxiety   ? COPD (chronic obstructive pulmonary disease) ([place])   ? Osteoporosis   ? ? ?Current Outpatient Medications:  ?  albuterol (VENTOLIN HFA) 108 (90 Base) MCG/ACT inhaler, INHALE 2 PUFFS INTO THE LUNGS EVERY 6 (SIX) HOURS AS NEEDED FOR WHEEZING OR  SHORTNESS OF BREATH., Disp: 3 each, Rfl: 2 ?  alendronate (FOSAMAX) 70 MG tablet, TAKE 1 TABLET EVERY 7 DAYS WITH A FULL GLASS OF WATER ON AN EMPTY STOMACH, Disp: 12 tablet, Rfl: 0 ?  ALPRAZolam (XANAX) 0.5 MG tablet, Take one tablet three times daily and one extra daily as needed for increased anxiety., Disp: 120 tablet, Rfl: 2 ?  aspirin EC 81 MG tablet, Take 81 mg by mouth daily., Disp: , Rfl:  ?  atorvastatin (LIPITOR) 40 MG tablet, Take 1 tablet daily, Disp: 30 tablet, Rfl: 0 ?  azithromycin (ZITHROMAX) 250 MG tablet, Take as directed, Disp: 6 tablet, Rfl: 3 ?  Budeson-Glycopyrrol-Formoterol (BREZTRI AEROSPHERE) 160-9-4.8 MCG/ACT AERO, Inhale 2 puffs into the lungs 2 (two) times daily. Please fill #3 inahlers for 3 month supply, Disp: 32.1 g, Rfl: 11 ?  busPIRone (BUSPAR) 30 MG tablet, TAKE 1 TABLET TWICE DAILY, Disp: 180 tablet, Rfl: 0 ?  calcium carbonate (OS-CAL) 1250 (500 Ca) MG chewable tablet, Chew 1 tablet (1,250 mg total) by mouth daily., Disp: 100 tablet, Rfl: 3 ?  diltiazem (CARDIZEM) 30 MG tablet, TAKE 1 TABLET TWICE DAILY AS NEEDED (NEED MD APPOINTMENT), Disp: 60 tablet, Rfl: 4 ?  DULoxetine (CYMBALTA) 60 MG capsule, TAKE 2 CAPSULES (120 MG TOTAL) EVERY DAY, Disp: 180 capsule, Rfl: 0 ?  EQ ALLERGY RELIEF, CETIRIZINE, 10 MG tablet, Take 1 tablet by mouth once daily, Disp: 90 tablet, Rfl: 0 ?  FOLIC ACID PO, Take 1 tablet by mouth daily., Disp: , Rfl:  ?  gabapentin (NEURONTIN) 300 MG capsule, TAKE 1 CAPSULE (300 MG TOTAL) BY MOUTH AT BEDTIME, Disp: 90 capsule, Rfl: 0 ?  ipratropium-albuterol (DUONEB) 0.5-2.5 (3) MG/3ML SOLN, Take 3 mLs by nebulization every 6 (six) hours as needed., Disp: 360 mL, Rfl: 5 ?  meclizine (ANTIVERT) 25 MG tablet, TAKE 1 TABLET BY MOUTH THREE TIMES DAILY AS NEEDED FOR DIZZINESS, Disp: 180 tablet, Rfl: 0 ?  meloxicam (MOBIC) 7.5 MG tablet, TAKE 1 TABLET EVERY DAY, Disp: 90 tablet, Rfl: 0 ?  Multiple Vitamin (MULTIVITAMIN WITH MINERALS) TABS tablet, Take 1 tablet by mouth  daily., Disp: , Rfl:  ?  Multiple Vitamins-Minerals (VITAMIN D3 COMPLETE PO), Take 1 tablet by mouth daily., Disp: , Rfl:  ?  nitroGLYCERIN (NITROSTAT) 0.4 MG SL tablet, Place 1 tablet (0.4 mg total) under the tongue every 5 (five) minutes x 3 doses as needed for chest pain (if o relief after 2nd dose, proceed to the ED for an evaluation or call 911). (Patient not taking: Reported on 03/13/2022), Disp: 25 tablet, Rfl: 1 ?  predniSONE (DELTASONE) 20 MG tablet, Take 77m for 5 days, Disp: 10 tablet, Rfl: 3 ?  QUEtiapine (SEROQUEL) 200 MG tablet, Take 1 tablet (200 mg total) by mouth at bedtime., Disp: 30 tablet, Rfl: 2 ?  Respiratory Therapy Supplies (NEBULIZER/TUBING/MOUTHPIECE) KIT, 1 Units by Does not apply route 4 (four) times daily. GIVE face mask and tubing for nebulizer. J44.1, Disp: 1 kit, Rfl: 0 ?  rOPINIRole (REQUIP) 1 MG tablet, TAKE 1 TO 4 TABLETS AT BEDTIME, Disp: 360 tablet, Rfl: 0 ?  vitamin B-12 (CYANOCOBALAMIN) 100 MCG tablet, Take 100 mcg by mouth daily., Disp: , Rfl:  ?  vitamin C (ASCORBIC ACID) 250 MG tablet, Take 500-1,000 mg by mouth daily., Disp: , Rfl:  ? ?Gen: Nontoxic appearing female ?HEENT: Exotropia.  Wears glasses ?Psych: Anxious, mood depressed. ? ?Assessment/ Plan: ?61y.o. female  ? ?Bilateral leg pain - Plan: gabapentin (NEURONTIN) 300 MG capsule ? ?DDD (degenerative disc disease), lumbar - Plan: gabapentin (NEURONTIN) 300 MG capsule ? ?GAD (generalized anxiety disorder) - Plan: Ambulatory referral to Psychology ? ?Stress due to marital problems ? ?Moderate episode of recurrent major depressive disorder ([place] - Plan: Ambulatory referral to Psychology ? ?Arthritis - Plan: meloxicam (MOBIC) 7.5 MG tablet ? ?Restless legs - Plan: rOPINIRole (REQUIP) 1 MG tablet ? ?Leg pain is essentially stable with gabapentin, meloxicam and Requip for restless leg ? ?She continues to have breakthrough anxiety and stress due to her marital situation and separation.  She is requesting a therapist and I  have placed a referral so that she may secure 1.  She will continue to follow-up with psychiatry for medication management however ? ? ?Start time: 2:29pm ?End time: 2:43p ? ?Total time spent on patient care (including video visit/ documentation): 14 minutes ? ?AJanora Norlander DO ?[facility]?[phone] ? ?

## 2022-03-18 DIAGNOSIS — J410 Simple chronic bronchitis: Secondary | ICD-10-CM

## 2022-03-18 DIAGNOSIS — J439 Emphysema, unspecified: Secondary | ICD-10-CM

## 2022-03-19 ENCOUNTER — Encounter: Payer: Self-pay | Admitting: Family Medicine

## 2022-03-20 ENCOUNTER — Encounter: Payer: Self-pay | Admitting: Adult Health

## 2022-03-20 ENCOUNTER — Telehealth (INDEPENDENT_AMBULATORY_CARE_PROVIDER_SITE_OTHER): Payer: Medicare HMO | Admitting: Adult Health

## 2022-03-20 DIAGNOSIS — F411 Generalized anxiety disorder: Secondary | ICD-10-CM

## 2022-03-20 DIAGNOSIS — F331 Major depressive disorder, recurrent, moderate: Secondary | ICD-10-CM | POA: Diagnosis not present

## 2022-03-20 DIAGNOSIS — G4700 Insomnia, unspecified: Secondary | ICD-10-CM

## 2022-03-20 DIAGNOSIS — F4310 Post-traumatic stress disorder, unspecified: Secondary | ICD-10-CM

## 2022-03-20 DIAGNOSIS — F410 Panic disorder [episodic paroxysmal anxiety] without agoraphobia: Secondary | ICD-10-CM

## 2022-03-20 MED ORDER — REXULTI 0.5 MG PO TABS: 0.5000 mg | ORAL_TABLET | Freq: Every day | ORAL | 2 refills | Status: DC

## 2022-03-20 MED ORDER — ALPRAZOLAM 0.5 MG PO TABS: ORAL_TABLET | ORAL | 2 refills | Status: DC

## 2022-03-20 MED ORDER — QUETIAPINE FUMARATE 200 MG PO TABS: 200.0000 mg | ORAL_TABLET | Freq: Every day | ORAL | 5 refills | Status: DC

## 2022-03-20 NOTE — Progress Notes (Signed)
Alicia Sanchez ?[id number] ?Nov 03, 1961 ?61 y.o. ? ?Virtual Visit via Video Note ? ?I connected with pt @ on 03/20/22 at 10:40 AM EDT by a video enabled telemedicine application and verified that I am speaking with the correct person using two identifiers. ?  ?I discussed the limitations of evaluation and management by telemedicine and the availability of in person appointments. The patient expressed understanding and agreed to proceed. ? ?I discussed the assessment and treatment plan with the patient. The patient was provided an opportunity to ask questions and all were answered. The patient agreed with the plan and demonstrated an understanding of the instructions. ?  ?The patient was advised to call back or seek an in-person evaluation if the symptoms worsen or if the condition fails to improve as anticipated. ? ?I provided 25 minutes of non-face-to-face time during this encounter.  The patient was located at home.  The provider was located at [place]. ? ? ?Aloha Gell, NP  ? ?Subjective:  ? ?Patient ID:  Alicia Sanchez is a 61 y.o. (DOB 1961/01/02) female. ? ?Chief Complaint: No chief complaint on file. ? ? ?HPI ?Alicia Sanchez presents for follow-up of GAD, MDD, PTSD, insomnia and panic attacks. ? ?Describes mood today as "not much different". Pleasant. Tearful. Mood symptoms - reports depression, anxiety and irritability. Reports panic attacks. Stating "things aren't going good". Husband has filed for separation after 19 years of marriage - "he doesn't want anything to do with me". Feels like she doesn't have the coping skills she needs - planning to see a therapist. Stating "it has been rough here lately". Reporting some alcohol use - "not a lot". Has support from family members. Decreased interest and motivation. Taking medications as prescribed.  ?Energy levels lower. Active, does not have a regular exercise routine.   ?Enjoys some usual interests and activities. Married, but separated. Has  two daughters and 2 step daughters. Has 3 sisters and 2 brothers. Mother living - talks to her. Spending time with family. ?Appetite adequate. Weight gain - 172 pounds. ?Sleeps better some nights than others. Getting to sleep with the Seroquel. Averages 4 to 5 hours a day - napping during the day. ?Focus and concentration difficulties. Completing tasks. Managing aspects of household. Disabled since 2020.  ?Denies SI or HI.  ?Denies AH or VH. ? ?Previous medication trials: Paxil, Wellbutrin, Depakote, Trazadone and others ? ? ?Review of Systems:  ?Review of Systems  ?Musculoskeletal:  Negative for gait problem.  ?Neurological:  Negative for tremors.  ?Psychiatric/Behavioral:    ?     Please refer to HPI  ? ?Medications: I have reviewed the patient's current medications. ? ?Current Outpatient Medications  ?Medication Sig Dispense Refill  ? Brexpiprazole (REXULTI) 0.5 MG TABS Take 1 tablet (0.5 mg total) by mouth daily. 30 tablet 2  ? albuterol (VENTOLIN HFA) 108 (90 Base) MCG/ACT inhaler INHALE 2 PUFFS INTO THE LUNGS EVERY 6 (SIX) HOURS AS NEEDED FOR WHEEZING OR SHORTNESS OF BREATH. 3 each 2  ? alendronate (FOSAMAX) 70 MG tablet TAKE 1 TABLET EVERY 7 DAYS WITH A FULL GLASS OF WATER ON AN EMPTY STOMACH 12 tablet 3  ? ALPRAZolam (XANAX) 0.5 MG tablet Take one tablet three times daily and one extra daily as needed for increased anxiety. 120 tablet 2  ? aspirin EC 81 MG tablet Take 81 mg by mouth daily.    ? atorvastatin (LIPITOR) 40 MG tablet Take 1 tablet daily 90 tablet 3  ? azithromycin (ZITHROMAX) 250 MG tablet Take  as directed 6 tablet 3  ? Budeson-Glycopyrrol-Formoterol (BREZTRI AEROSPHERE) 160-9-4.8 MCG/ACT AERO Inhale 2 puffs into the lungs 2 (two) times daily. Please fill #3 inahlers for 3 month supply 32.1 g 11  ? busPIRone (BUSPAR) 30 MG tablet TAKE 1 TABLET TWICE DAILY 180 tablet 0  ? calcium carbonate (OS-CAL) 1250 (500 Ca) MG chewable tablet Chew 1 tablet (1,250 mg total) by mouth daily. 100 tablet 3  ?  diltiazem (CARDIZEM) 30 MG tablet TAKE 1 TABLET TWICE DAILY AS NEEDED (NEED MD APPOINTMENT) 60 tablet 4  ? DULoxetine (CYMBALTA) 60 MG capsule TAKE 2 CAPSULES (120 MG TOTAL) EVERY DAY 180 capsule 0  ? EQ ALLERGY RELIEF, CETIRIZINE, 10 MG tablet Take 1 tablet by mouth once daily 90 tablet 0  ? FOLIC ACID PO Take 1 tablet by mouth daily.    ? gabapentin (NEURONTIN) 300 MG capsule Take 1 capsule (300 mg total) by mouth at bedtime. 90 capsule 3  ? ipratropium-albuterol (DUONEB) 0.5-2.5 (3) MG/3ML SOLN Take 3 mLs by nebulization every 6 (six) hours as needed. 360 mL 5  ? meclizine (ANTIVERT) 25 MG tablet TAKE 1 TABLET BY MOUTH THREE TIMES DAILY AS NEEDED FOR DIZZINESS 180 tablet 0  ? meloxicam (MOBIC) 7.5 MG tablet Take 1 tablet (7.5 mg total) by mouth daily. 90 tablet 3  ? Multiple Vitamin (MULTIVITAMIN WITH MINERALS) TABS tablet Take 1 tablet by mouth daily.    ? Multiple Vitamins-Minerals (VITAMIN D3 COMPLETE PO) Take 1 tablet by mouth daily.    ? nitroGLYCERIN (NITROSTAT) 0.4 MG SL tablet Place 1 tablet (0.4 mg total) under the tongue every 5 (five) minutes x 3 doses as needed for chest pain (if o relief after 2nd dose, proceed to the ED for an evaluation or call 911). (Patient not taking: Reported on 03/13/2022) 25 tablet 1  ? predniSONE (DELTASONE) 20 MG tablet Take 30m for 5 days 10 tablet 3  ? QUEtiapine (SEROQUEL) 200 MG tablet Take 1 tablet (200 mg total) by mouth at bedtime. 30 tablet 5  ? Respiratory Therapy Supplies (NEBULIZER/TUBING/MOUTHPIECE) KIT 1 Units by Does not apply route 4 (four) times daily. GIVE face mask and tubing for nebulizer. J44.1 1 kit 0  ? rOPINIRole (REQUIP) 1 MG tablet Take 1-4 tablets (1-4 mg total) by mouth at bedtime. 360 tablet 3  ? vitamin B-12 (CYANOCOBALAMIN) 100 MCG tablet Take 100 mcg by mouth daily.    ? vitamin C (ASCORBIC ACID) 250 MG tablet Take 500-1,000 mg by mouth daily.    ? ?No current facility-administered medications for this visit.  ? ? ?Medication Side Effects:  None ? ?Allergies:  ?Allergies  ?Allergen Reactions  ? Doxycycline   ?  Chest pain  ? ? ?Past Medical History:  ?Diagnosis Date  ? Anxiety   ? COPD (chronic obstructive pulmonary disease) ([place]   ? Osteoporosis   ? ? ?Family History  ?Problem Relation Age of Onset  ? COPD Mother   ? COPD Father   ? Diabetes type II Sister   ? Diabetes type II Brother   ? COPD Maternal Grandfather   ? ? ?Social History  ? ?Socioeconomic History  ? Marital status: Legally Separated  ?  Spouse name: Not on file  ? Number of children: Not on file  ? Years of education: Not on file  ? Highest education level: Not on file  ?Occupational History  ? Not on file  ?Tobacco Use  ? Smoking status: Former  ?  Packs/day: 0.50  ?  Years: 42.00  ?  Pack years: 21.00  ?  Types: Cigarettes  ? Smokeless tobacco: Never  ? Tobacco comments:  ?  1/2 pack- 1pack a day  ?Vaping Use  ? Vaping Use: Never used  ?Substance and Sexual Activity  ? Alcohol use: No  ? Drug use: No  ? Sexual activity: Yes  ?  Birth control/protection: Surgical  ?Other Topics Concern  ? Not on file  ?Social History Narrative  ? Not on file  ? ?Social Determinants of Health  ? ?Financial Resource Strain: Not on file  ?Food Insecurity: No Food Insecurity  ? Worried About Charity fundraiser in the Last Year: Never true  ? Ran Out of Food in the Last Year: Never true  ?Transportation Needs: Not on file  ?Physical Activity: Not on file  ?Stress: Stress Concern Present  ? Feeling of Stress : Rather much  ?Social Connections: Not on file  ?Intimate Partner Violence: Not At Risk  ? Fear of Current or Ex-Partner: No  ? Emotionally Abused: No  ? Physically Abused: No  ? Sexually Abused: No  ? ? ?Past Medical History, Surgical history, Social history, and Family history were reviewed and updated as appropriate.  ? ?Please see review of systems for further details on the patient's review from today.  ? ?Objective:  ? ?Physical Exam:  ?There were no vitals taken for this visit. ? ?Physical  Exam ?Constitutional:   ?   General: She is not in acute distress. ?Musculoskeletal:     ?   General: No deformity.  ?Neurological:  ?   Mental Status: She is alert and oriented to person, place, and time.  ?

## 2022-03-21 ENCOUNTER — Other Ambulatory Visit: Payer: Self-pay | Admitting: Family Medicine

## 2022-03-21 DIAGNOSIS — F331 Major depressive disorder, recurrent, moderate: Secondary | ICD-10-CM

## 2022-03-22 ENCOUNTER — Telehealth: Payer: Self-pay | Admitting: Cardiology

## 2022-03-22 NOTE — Telephone Encounter (Signed)
Pt c/o BP issue: STAT if pt c/o blurred vision, one-sided weakness or slurred speech ? ?1. What are your last 5 BP readings? 89/73 ? ?2. Are you having any other symptoms (ex. Dizziness, headache, blurred vision, passed out)? Blacked out ? ?3. What is your BP issue? Low BP ? ? ?Patient states she blacked out and had to call her daughter, because she could not get up. She says her grandkids came and got her up and noticed she had wet herself. Sat her on the bed. She states her BP was 89/73 after she was helped to the bathroom. She says this happened the day before yesterday and it scared her. Line ended before I could get any more information. Attemped to call back and left vm. ?

## 2022-03-23 NOTE — Telephone Encounter (Signed)
Reports black out episode was Monday or Tuesday night this week. Says she did pass out. Reports getting dizzy prior to passing out. Did not go to ED. Says she has not had anymore black out episodes since then. Reports staying well hydrated. Reports drinking at least 4-5 bottles of water per day.  ?Yesterday BP 114/83 & unsure of HR ?BP now 86/78 & HR 92 ?Reports having SOB most of the time due to COPD.  ?Reports chest pain rated 4/10 x's 1 on yesterday. Did not take nitroglycerin. Denies active chest pain ?Medications reviewed.  ?Advised to stay well hydrated, change positions slowly, keep appointment with [facility] on 03/27/22, and if symptoms get worse, to go to the ED for an evaluation. Verbalized understanding of plan.  ?

## 2022-03-27 ENCOUNTER — Ambulatory Visit (INDEPENDENT_AMBULATORY_CARE_PROVIDER_SITE_OTHER): Payer: Medicare HMO | Admitting: Cardiology

## 2022-03-27 ENCOUNTER — Encounter: Payer: Self-pay | Admitting: Cardiology

## 2022-03-27 VITALS — BP 110/78 | HR 101 | Ht 64.5 in | Wt 163.0 lb

## 2022-03-27 DIAGNOSIS — I951 Orthostatic hypotension: Secondary | ICD-10-CM

## 2022-03-27 DIAGNOSIS — R55 Syncope and collapse: Secondary | ICD-10-CM | POA: Diagnosis not present

## 2022-03-27 NOTE — Progress Notes (Signed)
? ? ? ?Clinical Summary ?Ms. Grabinski is a 61 y.o.female for a focused visit for a recent episode of syncope ? ?1.Syncope ?- episode 1 week ago ?- was at home sitting at kitchen table. Just took medications. Stood up and started toward, felt dizzy. Fell to floor, unsure if true LOC. Bladder incontinence. Had phone in her hand, called daughter, family came over. Tried to get up and recurrent symptoms.  ?- chronic orthostatic symptoms ?- water 4-5 bottles per day. ?- when kids got her up, went to bed room and checked bp. 89/73 ?-orthostatics abnormal, DBP drops 18 points with standing.  ? ? ? ? ? ?Other medical issues not addressed this visit ? ? ?1. COPD/DOE ?- 07/2018 PFTs severe COPD ?- followed by pulmonary Dr Vaughan Browner  ?- on home O2 2 L ?  ?- at last visit unclear if her COPD was the only cause of her SOB, referred for echo ?06/2018 echo LVEF 68-12%, normal diastolic function ?  ? - breathing is much improved. Has nearly quit smoking. Wears O2 as needed ?  ?  ?2. Palpitations ?- DOE short distances with palpitations. Can occur at rest, particularly at night ?- lasts a few minutes. Can feel her herself breathing heavy ?- occurs daily ?- coffee x 1-2 cups, no tea, diet mountain dews 532m x 3-4, rare etoh. ?Ongoing a few years.  ?- can be better with xanax.  ?- started on coreg < 1 month, symptoms better with beta blocker. Takes in AM, second dose prn.  ?  ?06/2018 event monitor benign.  ?  ?- ongoing palpitations at times. Overal stable ?- high caffeine intake ?- takes her prn diltiazem ?  ?  ?  ?2. Chest pain ?- pressure/tightness mid to left chest. Rest or at rest. Mild to moderate pain, lasts about 1 minute ?- +SOB, can feel anxious ?- limited exertion due to her chornic COPD.  ?- recent CT lung cance screen showed CAD ?  ? 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 Anxiety   ? [place]   ? Osteoporosis    ? ? ? ?Allergies  ?Allergen Reactions  ? Doxycycline   ?  Chest pain  ? ? ? ?Current Outpatient Medications  ?Medication Sig Dispense Refill  ? albuterol (VENTOLIN HFA) 108 (90 Base) MCG/ACT inhaler INHALE 2 PUFFS INTO THE LUNGS EVERY 6 (SIX) HOURS AS NEEDED FOR WHEEZING OR SHORTNESS OF BREATH. 3 each 2  ? alendronate (FOSAMAX) 70 MG tablet TAKE 1 TABLET EVERY 7 DAYS WITH A FULL GLASS OF WATER ON AN EMPTY STOMACH 12 tablet 3  ? ALPRAZolam (XANAX) 0.5 MG tablet Take one tablet three times daily and one extra daily as needed for increased anxiety. 120 tablet 2  ? aspirin EC 81 MG tablet Take 81 mg by mouth daily.    ? atorvastatin (LIPITOR) 40 MG tablet Take 1 tablet daily 90 tablet 3  ? azithromycin (ZITHROMAX) 250 MG tablet Take as directed 6 tablet 3  ? Brexpiprazole (REXULTI) 0.5 MG TABS Take 1 tablet (0.5 mg total) by mouth daily. 30 tablet 2  ? Budeson-Glycopyrrol-Formoterol (BREZTRI AEROSPHERE) 160-9-4.8 MCG/ACT AERO Inhale 2 puffs into the lungs 2 (two) times daily. Please fill #3 inahlers for 3 month supply 32.1 g 11  ? busPIRone (BUSPAR) 30 MG tablet TAKE 1 TABLET TWICE DAILY 180 tablet 0  ? calcium carbonate (OS-CAL) 1250 (500 Ca) MG chewable tablet Chew  1 tablet (1,250 mg total) by mouth daily. 100 tablet 3  ? diltiazem (CARDIZEM) 30 MG tablet TAKE 1 TABLET TWICE DAILY AS NEEDED (NEED MD APPOINTMENT) 60 tablet 4  ? DULoxetine (CYMBALTA) 60 MG capsule TAKE 2 CAPSULES (120 MG TOTAL) EVERY DAY 180 capsule 0  ? EQ ALLERGY RELIEF, CETIRIZINE, 10 MG tablet Take 1 tablet by mouth once daily 90 tablet 0  ? FOLIC ACID PO Take 1 tablet by mouth daily.    ? gabapentin (NEURONTIN) 300 MG capsule Take 1 capsule (300 mg total) by mouth at bedtime. 90 capsule 3  ? ipratropium-albuterol (DUONEB) 0.5-2.5 (3) MG/3ML SOLN Take 3 mLs by nebulization every 6 (six) hours as needed. 360 mL 5  ? meclizine (ANTIVERT) 25 MG tablet TAKE 1 TABLET BY MOUTH THREE TIMES DAILY AS NEEDED FOR DIZZINESS 180 tablet 0  ? meloxicam (MOBIC)  7.5 MG tablet Take 1 tablet (7.5 mg total) by mouth daily. 90 tablet 3  ? Multiple Vitamin (MULTIVITAMIN WITH MINERALS) TABS tablet Take 1 tablet by mouth daily.    ? Multiple Vitamins-Minerals (VITAMIN D3 COMPLETE PO) Take 1 tablet by mouth daily.    ? nitroGLYCERIN (NITROSTAT) 0.4 MG SL tablet Place 1 tablet (0.4 mg total) under the tongue every 5 (five) minutes x 3 doses as needed for chest pain (if o relief after 2nd dose, proceed to the ED for an evaluation or call 911). (Patient not taking: Reported on 03/13/2022) 25 tablet 1  ? predniSONE (DELTASONE) 20 MG tablet Take 84m for 5 days 10 tablet 3  ? QUEtiapine (SEROQUEL) 200 MG tablet Take 1 tablet (200 mg total) by mouth at bedtime. 30 tablet 5  ? Respiratory Therapy Supplies (NEBULIZER/TUBING/MOUTHPIECE) KIT 1 Units by Does not apply route 4 (four) times daily. GIVE face mask and tubing for nebulizer. J44.1 1 kit 0  ? rOPINIRole (REQUIP) 1 MG tablet Take 1-4 tablets (1-4 mg total) by mouth at bedtime. 360 tablet 3  ? vitamin B-12 (CYANOCOBALAMIN) 100 MCG tablet Take 100 mcg by mouth daily.    ? vitamin C (ASCORBIC ACID) 250 MG tablet Take 500-1,000 mg by mouth daily.    ? ?No current facility-administered medications for this visit.  ? ? ? ?Past Surgical History:  ?Procedure Laterality Date  ? ABDOMINAL HYSTERECTOMY    ? ? ? ?Allergies  ?Allergen Reactions  ? Doxycycline   ?  Chest pain  ? ? ? ? ?Family History  ?Problem Relation Age of Onset  ? COPD Mother   ? COPD Father   ? Diabetes type II Sister   ? Diabetes type II Brother   ? COPD Maternal Grandfather   ? ? ? ?Social History ?Ms. RWhetselreports that she has quit smoking. Her smoking use included cigarettes. She has a 21.00 pack-year smoking history. She has never used smokeless tobacco. ?Ms. Schindler reports no history of alcohol use. ? ? ?Review of Systems ?CONSTITUTIONAL: No weight loss, fever, chills, weakness or fatigue.  ?HEENT: Eyes: No visual loss, blurred vision, double vision or yellow  sclerae.No hearing loss, sneezing, congestion, runny nose or sore throat.  ?SKIN: No rash or itching.  ?CARDIOVASCULAR: per hpi ?RESPIRATORY: No shortness of breath, cough or sputum.  ?GASTROINTESTINAL: No anorexia, nausea, vomiting or diarrhea. No abdominal pain or blood.  ?GENITOURINARY: No burning on urination, no polyuria ?NEUROLOGICAL: No headache, dizziness, syncope, paralysis, ataxia, numbness or tingling in the extremities. No change in bowel or bladder control.  ?MUSCULOSKELETAL: No muscle, back pain, joint pain or  stiffness.  ?LYMPHATICS: No enlarged nodes. No history of splenectomy.  ?PSYCHIATRIC: No history of depression or anxiety.  ?ENDOCRINOLOGIC: No reports of sweating, cold or heat intolerance. No polyuria or polydipsia.  ?. ? ? ?Physical Examination ?Today's Vitals  ? 03/27/22 1050  ?BP: 110/78  ?Pulse: (!) 101  ?SpO2: 96%  ?Weight: 163 lb (73.9 kg)  ?Height: 5' 4.5" (1.638 m)  ? ?Body mass index is 27.55 kg/m?. ? ?Gen: resting comfortably, no acute distress ?HEENT: no scleral icterus, pupils equal round and reactive, no palptable cervical adenopathy,  ?CV: RRR, no m/r/g no jvd ?Resp: Clear to auscultation bilaterally ?GI: abdomen is soft, non-tender, non-distended, normal bowel sounds, no hepatosplenomegaly ?MSK: extremities are warm, no edema.  ?Skin: warm, no rash ?Neuro:  no focal deficits ?Psych: appropriate affect ? ? ?Diagnostic Studies ?Jan 2017 ?Study Conclusions ?  ?- Left ventricle: The cavity size was normal. Systolic function was ?  vigorous. The estimated ejection fraction was in the range of 65% ?  to 70%. Wall motion was normal; there were no regional wall ?  motion abnormalities. Doppler parameters are consistent with ?  abnormal left ventricular relaxation (grade 1 diastolic ?  dysfunction). There was no evidence of elevated ventricular ?  filling pressure by Doppler parameters. ?- Aortic valve: There was no regurgitation. ?- Aortic root: The aortic root was normal in size. ?-  Mitral valve: Structurally normal valve. There was mild ?  regurgitation. ?- Right ventricle: The cavity size was normal. Wall thickness was ?  normal. Systolic function was normal. ?- Right atrium: The atrium

## 2022-03-27 NOTE — Patient Instructions (Addendum)

## 2022-03-28 ENCOUNTER — Telehealth: Payer: Self-pay

## 2022-03-28 NOTE — Telephone Encounter (Signed)
Prior Authorization submitted and approved for REXULTI 0.5 MG effective 11/19/2021-11/18/2022, PA# [id number] with [employer] [id number] ?

## 2022-04-02 ENCOUNTER — Telehealth: Payer: Self-pay | Admitting: Adult Health

## 2022-04-02 NOTE — Telephone Encounter (Signed)
Pt advised the Rexaulti that Gina prescribed even with a pharmacy card is $95.  She is wonder in there is something else Barnett Applebaum can prescribed or if there is any patient assistance she can get with the [place]. ? ?No upcoming appt scheduled. ?

## 2022-04-02 NOTE — Telephone Encounter (Signed)
We received PA approval but cost is still too much for patient. She is on disability and does not qualify to use a co-pay card. Is there any patient assistance program that patient will be eligible for. ? ?This is for Rexulti.  ?

## 2022-04-03 ENCOUNTER — Ambulatory Visit (HOSPITAL_COMMUNITY)
Admission: RE | Admit: 2022-04-03 | Discharge: 2022-04-03 | Disposition: A | Discharge status: Home / Self Care | Payer: Medicare HMO | Source: Ambulatory Visit | Attending: Acute Care | Admitting: Acute Care

## 2022-04-03 DIAGNOSIS — F17210 Nicotine dependence, cigarettes, uncomplicated: Secondary | ICD-10-CM | POA: Diagnosis not present

## 2022-04-03 DIAGNOSIS — I700 Atherosclerosis of aorta: Secondary | ICD-10-CM | POA: Diagnosis not present

## 2022-04-03 DIAGNOSIS — Z122 Encounter for screening for malignant neoplasm of respiratory organs: Secondary | ICD-10-CM | POA: Insufficient documentation

## 2022-04-03 DIAGNOSIS — I2510 Atherosclerotic heart disease of native coronary artery without angina pectoris: Secondary | ICD-10-CM | POA: Insufficient documentation

## 2022-04-03 DIAGNOSIS — J439 Emphysema, unspecified: Secondary | ICD-10-CM | POA: Diagnosis not present

## 2022-04-04 ENCOUNTER — Other Ambulatory Visit: Payer: Self-pay | Admitting: Family Medicine

## 2022-04-04 DIAGNOSIS — F411 Generalized anxiety disorder: Secondary | ICD-10-CM

## 2022-04-05 ENCOUNTER — Other Ambulatory Visit: Payer: Self-pay

## 2022-04-05 DIAGNOSIS — F17210 Nicotine dependence, cigarettes, uncomplicated: Secondary | ICD-10-CM

## 2022-04-05 DIAGNOSIS — Z122 Encounter for screening for malignant neoplasm of respiratory organs: Secondary | ICD-10-CM

## 2022-04-05 DIAGNOSIS — Z87891 Personal history of nicotine dependence: Secondary | ICD-10-CM

## 2022-04-13 DIAGNOSIS — J449 Chronic obstructive pulmonary disease, unspecified: Secondary | ICD-10-CM | POA: Diagnosis not present

## 2022-05-09 ENCOUNTER — Other Ambulatory Visit: Payer: Self-pay | Admitting: Cardiology

## 2022-05-09 DIAGNOSIS — L0232 Furuncle of buttock: Secondary | ICD-10-CM | POA: Diagnosis not present

## 2022-05-10 ENCOUNTER — Ambulatory Visit (HOSPITAL_COMMUNITY): Payer: Medicare HMO | Admitting: Clinical

## 2022-05-14 DIAGNOSIS — J449 Chronic obstructive pulmonary disease, unspecified: Secondary | ICD-10-CM | POA: Diagnosis not present

## 2022-05-18 ENCOUNTER — Telehealth (INDEPENDENT_AMBULATORY_CARE_PROVIDER_SITE_OTHER): Payer: Medicare HMO | Admitting: Family Medicine

## 2022-05-18 ENCOUNTER — Encounter: Payer: Self-pay | Admitting: Family Medicine

## 2022-05-18 DIAGNOSIS — I951 Orthostatic hypotension: Secondary | ICD-10-CM | POA: Diagnosis not present

## 2022-05-18 DIAGNOSIS — F331 Major depressive disorder, recurrent, moderate: Secondary | ICD-10-CM

## 2022-05-18 DIAGNOSIS — J432 Centrilobular emphysema: Secondary | ICD-10-CM

## 2022-05-18 DIAGNOSIS — Z630 Problems in relationship with spouse or partner: Secondary | ICD-10-CM | POA: Diagnosis not present

## 2022-05-18 NOTE — Progress Notes (Signed)
MyChart Video visit  Subjective: CC: Follow-up mood PCP: Janora Norlander, DO XFG:[id number] Bucks is a 61 y.o. female. Patient provides verbal consent for consult held via video.  Due to COVID-19 pandemic this visit was conducted virtually. This visit type was conducted due to national recommendations for restrictions regarding the COVID-19 Pandemic (e.g. social distancing, sheltering in place) in an effort to limit this patient's exposure and mitigate transmission in our community. All issues noted in this document were discussed and addressed.  A physical exam was not performed with this format.   Location of patient: home Location of provider: [facility] Others present for call: None  1. Orthostatic hypotension Reports that BP was low again last night down to systolics of 71I.  Her BP today was 108/67, HR 108.  She does take her Cardizem twice daily every day rather than as a as needed.  She was not aware that this was supposed exclusively for as needed use.  She saw her cardiologist just a few days ago and it does not sound like they recognize that this was being used twice daily regularly either as they recommended fluid resuscitation to improve intermittent orthostasis and presyncopal episodes.  2.  Anxiety disorder Patient continues to suffer from anxiety and depression.  She just saw her psychiatrist recently.  Meds were slightly adjusted.  She continues to go through a divorce with her spouse and this does of course away on her.  She will be going to the beach for a couple of weeks to relax.  She did get called by the therapist but has not yet established care nor set up an appointment because she has been out of town.   ROS: Per HPI  Allergies  Allergen Reactions   Doxycycline     Chest pain   Past Medical History:  Diagnosis Date   Anxiety    COPD (chronic obstructive pulmonary disease) (HCC)    Osteoporosis     Current Outpatient Medications:    albuterol (VENTOLIN HFA) 108  (90 Base) MCG/ACT inhaler, INHALE 2 PUFFS INTO THE LUNGS EVERY 6 (SIX) HOURS AS NEEDED FOR WHEEZING OR SHORTNESS OF BREATH., Disp: 3 each, Rfl: 2   alendronate (FOSAMAX) 70 MG tablet, TAKE 1 TABLET EVERY 7 DAYS WITH A FULL GLASS OF WATER ON AN EMPTY STOMACH, Disp: 12 tablet, Rfl: 3   ALPRAZolam (XANAX) 0.5 MG tablet, Take one tablet three times daily and one extra daily as needed for increased anxiety., Disp: 120 tablet, Rfl: 2   aspirin EC 81 MG tablet, Take 81 mg by mouth daily., Disp: , Rfl:    atorvastatin (LIPITOR) 40 MG tablet, TAKE 1 TABLET EVERY DAY (NEED MD APPOINTMENT FOR REFILLS), Disp: 90 tablet, Rfl: 3   azithromycin (ZITHROMAX) 250 MG tablet, Take as directed (Patient not taking: Reported on 03/27/2022), Disp: 6 tablet, Rfl: 3   Brexpiprazole (REXULTI) 0.5 MG TABS, Take 1 tablet (0.5 mg total) by mouth daily. (Patient not taking: Reported on 03/27/2022), Disp: 30 tablet, Rfl: 2   Budeson-Glycopyrrol-Formoterol (BREZTRI AEROSPHERE) 160-9-4.8 MCG/ACT AERO, Inhale 2 puffs into the lungs 2 (two) times daily. Please fill #3 inahlers for 3 month supply, Disp: 32.1 g, Rfl: 11   busPIRone (BUSPAR) 30 MG tablet, TAKE 1 TABLET TWICE DAILY, Disp: 180 tablet, Rfl: 0   calcium carbonate (OS-CAL) 1250 (500 Ca) MG chewable tablet, Chew 1 tablet (1,250 mg total) by mouth daily., Disp: 100 tablet, Rfl: 3   diltiazem (CARDIZEM) 30 MG tablet, TAKE 1 TABLET TWICE DAILY  AS NEEDED (NEED MD APPOINTMENT), Disp: 60 tablet, Rfl: 4   DULoxetine (CYMBALTA) 60 MG capsule, TAKE 2 CAPSULES (120 MG TOTAL) EVERY DAY, Disp: 180 capsule, Rfl: 0   EQ ALLERGY RELIEF, CETIRIZINE, 10 MG tablet, Take 1 tablet by mouth once daily, Disp: 90 tablet, Rfl: 0   FOLIC ACID PO, Take 1 tablet by mouth daily., Disp: , Rfl:    gabapentin (NEURONTIN) 300 MG capsule, Take 1 capsule (300 mg total) by mouth at bedtime., Disp: 90 capsule, Rfl: 3   ipratropium-albuterol (DUONEB) 0.5-2.5 (3) MG/3ML SOLN, Take 3 mLs by nebulization every 6 (six)  hours as needed., Disp: 360 mL, Rfl: 5   meclizine (ANTIVERT) 25 MG tablet, TAKE 1 TABLET BY MOUTH THREE TIMES DAILY AS NEEDED FOR DIZZINESS, Disp: 180 tablet, Rfl: 0   meloxicam (MOBIC) 7.5 MG tablet, Take 1 tablet (7.5 mg total) by mouth daily., Disp: 90 tablet, Rfl: 3   Multiple Vitamin (MULTIVITAMIN WITH MINERALS) TABS tablet, Take 1 tablet by mouth daily. (Patient not taking: Reported on 03/27/2022), Disp: , Rfl:    Multiple Vitamins-Minerals (VITAMIN D3 COMPLETE PO), Take 1 tablet by mouth daily., Disp: , Rfl:    nitroGLYCERIN (NITROSTAT) 0.4 MG SL tablet, Place 1 tablet (0.4 mg total) under the tongue every 5 (five) minutes x 3 doses as needed for chest pain (if o relief after 2nd dose, proceed to the ED for an evaluation or call 911)., Disp: 25 tablet, Rfl: 1   QUEtiapine (SEROQUEL) 200 MG tablet, Take 1 tablet (200 mg total) by mouth at bedtime., Disp: 30 tablet, Rfl: 5   Respiratory Therapy Supplies (NEBULIZER/TUBING/MOUTHPIECE) KIT, 1 Units by Does not apply route 4 (four) times daily. GIVE face mask and tubing for nebulizer. J44.1, Disp: 1 kit, Rfl: 0   rOPINIRole (REQUIP) 1 MG tablet, Take 1-4 tablets (1-4 mg total) by mouth at bedtime., Disp: 360 tablet, Rfl: 3   vitamin B-12 (CYANOCOBALAMIN) 100 MCG tablet, Take 100 mcg by mouth daily., Disp: , Rfl:    vitamin C (ASCORBIC ACID) 250 MG tablet, Take 500-1,000 mg by mouth daily., Disp: , Rfl:   Gen: Nontoxic female no acute distress.  Well-groomed Psych: Mood stable, speech normal. Good eye contact  Assessment/ Plan: 61 y.o. female   Orthostatic hypotension - Plan: TSH, CMP14+EGFR  Centrilobular emphysema (HCC) - Plan: TSH, CMP14+EGFR, CBC  Stress due to marital problems - Plan: TSH  Moderate episode of recurrent major depressive disorder ([place])  I think that she should discontinue the CCB since this is likely what is precipitating her low blood pressures, orthostasis and presyncopal episodes.  I am going to [place] her cardiologist as  Juluis Rainier as I do not think that he actually realizes that she is taking this consistently rather than as needed as directed  Nonfasting labs have been ordered for the patient.  Check metabolic labs  I highly encouraged her to contact her therapist to establish care.  She should have this information since it was left previously on voicemail but will contact me if she cannot find it.  Continue to follow-up with psychiatry as directed  Start time: 2:22pm (text link sent, disconnected after 2 mins); 2:51pm End time: 2:59pm  Total time spent on patient care (including video visit/ documentation): 8 minutes  [place], [place] [phone]

## 2022-05-23 ENCOUNTER — Ambulatory Visit: Payer: Medicare HMO | Admitting: Family Medicine

## 2022-05-24 NOTE — Telephone Encounter (Signed)
Patient is not taking it because she never got samples to try it, as she did a video visit.  She is out of town currently but will come get samples to try.

## 2022-05-26 ENCOUNTER — Other Ambulatory Visit: Payer: Self-pay | Admitting: Family Medicine

## 2022-05-26 DIAGNOSIS — J302 Other seasonal allergic rhinitis: Secondary | ICD-10-CM

## 2022-06-04 ENCOUNTER — Other Ambulatory Visit: Payer: Medicare HMO

## 2022-06-04 DIAGNOSIS — Z630 Problems in relationship with spouse or partner: Secondary | ICD-10-CM | POA: Diagnosis not present

## 2022-06-04 DIAGNOSIS — R6889 Other general symptoms and signs: Secondary | ICD-10-CM | POA: Diagnosis not present

## 2022-06-04 DIAGNOSIS — J432 Centrilobular emphysema: Secondary | ICD-10-CM

## 2022-06-04 DIAGNOSIS — I951 Orthostatic hypotension: Secondary | ICD-10-CM

## 2022-06-05 LAB — CMP14+EGFR
ALT: 23 IU/L (ref 0–32)
AST: 30 IU/L (ref 0–40)
Albumin/Globulin Ratio: 1.8 (ref 1.2–2.2)
Albumin: 4.4 g/dL (ref 3.9–4.9)
Alkaline Phosphatase: 68 IU/L (ref 44–121)
BUN/Creatinine Ratio: 13 (ref 12–28)
BUN: 12 mg/dL (ref 8–27)
Bilirubin Total: 0.6 mg/dL (ref 0.0–1.2)
CO2: 21 mmol/L (ref 20–29)
Calcium: 10.3 mg/dL (ref 8.7–10.3)
Chloride: 100 mmol/L (ref 96–106)
Creatinine, Ser: 0.89 mg/dL (ref 0.57–1.00)
Globulin, Total: 2.5 g/dL (ref 1.5–4.5)
Glucose: 107 mg/dL — ABNORMAL HIGH (ref 70–99)
Potassium: 4.5 mmol/L (ref 3.5–5.2)
Sodium: 142 mmol/L (ref 134–144)
Total Protein: 6.9 g/dL (ref 6.0–8.5)
eGFR: 74 mL/min/{1.73_m2} (ref >59)

## 2022-06-05 LAB — CBC
Hematocrit: 48.4 % — ABNORMAL HIGH (ref 34.0–46.6)
Hemoglobin: 16.5 g/dL — ABNORMAL HIGH (ref 11.1–15.9)
MCH: 30.8 pg (ref 26.6–33.0)
MCHC: 34.1 g/dL (ref 31.5–35.7)
MCV: 90 fL (ref 79–97)
Platelets: 350 10*3/uL (ref 150–450)
RBC: 5.36 x10E6/uL — ABNORMAL HIGH (ref 3.77–5.28)
RDW: 12.7 % (ref 11.7–15.4)
WBC: 8.1 10*3/uL (ref 3.4–10.8)

## 2022-06-05 LAB — TSH: TSH: 1.66 u[IU]/mL (ref 0.450–4.500)

## 2022-06-11 ENCOUNTER — Other Ambulatory Visit: Payer: Self-pay | Admitting: Family Medicine

## 2022-06-11 DIAGNOSIS — F331 Major depressive disorder, recurrent, moderate: Secondary | ICD-10-CM

## 2022-07-10 DIAGNOSIS — M940 Chondrocostal junction syndrome [Tietze]: Secondary | ICD-10-CM | POA: Diagnosis not present

## 2022-07-10 DIAGNOSIS — R079 Chest pain, unspecified: Secondary | ICD-10-CM | POA: Diagnosis not present

## 2022-07-10 DIAGNOSIS — R52 Pain, unspecified: Secondary | ICD-10-CM | POA: Diagnosis not present

## 2022-07-10 DIAGNOSIS — I959 Hypotension, unspecified: Secondary | ICD-10-CM | POA: Diagnosis not present

## 2022-07-11 ENCOUNTER — Ambulatory Visit: Payer: Medicare HMO | Admitting: Family Medicine

## 2022-07-13 ENCOUNTER — Telehealth: Payer: Self-pay | Admitting: Adult Health

## 2022-07-13 ENCOUNTER — Other Ambulatory Visit: Payer: Self-pay

## 2022-07-13 DIAGNOSIS — F411 Generalized anxiety disorder: Secondary | ICD-10-CM

## 2022-07-13 DIAGNOSIS — F410 Panic disorder [episodic paroxysmal anxiety] without agoraphobia: Secondary | ICD-10-CM

## 2022-07-13 MED ORDER — ALPRAZOLAM 0.5 MG PO TABS: ORAL_TABLET | ORAL | 0 refills | Status: DC

## 2022-07-13 NOTE — Telephone Encounter (Signed)
Pended.

## 2022-07-13 NOTE — Telephone Encounter (Signed)
Pt has an appt 8/30. She needs a refill on her xanax. She had one refill transferred so all the rest of the refills are gone. Pharmacy is [facility] in [place]

## 2022-07-16 ENCOUNTER — Encounter: Payer: Self-pay | Admitting: Family Medicine

## 2022-07-18 ENCOUNTER — Encounter: Payer: Self-pay | Admitting: Adult Health

## 2022-07-18 ENCOUNTER — Telehealth (INDEPENDENT_AMBULATORY_CARE_PROVIDER_SITE_OTHER): Payer: Medicare HMO | Admitting: Adult Health

## 2022-07-18 DIAGNOSIS — F411 Generalized anxiety disorder: Secondary | ICD-10-CM | POA: Diagnosis not present

## 2022-07-18 DIAGNOSIS — F410 Panic disorder [episodic paroxysmal anxiety] without agoraphobia: Secondary | ICD-10-CM | POA: Diagnosis not present

## 2022-07-18 DIAGNOSIS — G4700 Insomnia, unspecified: Secondary | ICD-10-CM | POA: Diagnosis not present

## 2022-07-18 DIAGNOSIS — F331 Major depressive disorder, recurrent, moderate: Secondary | ICD-10-CM | POA: Diagnosis not present

## 2022-07-18 DIAGNOSIS — F4310 Post-traumatic stress disorder, unspecified: Secondary | ICD-10-CM | POA: Diagnosis not present

## 2022-07-18 NOTE — Progress Notes (Signed)
Alicia Sanchez [id number] October 16, 1961 61 y.o.  Virtual Visit via Video Note  I connected with pt @ on 07/18/22 at  2:40 PM EDT by a video enabled telemedicine application and verified that I am speaking with the correct person using two identifiers.   I discussed the limitations of evaluation and management by telemedicine and the availability of in person appointments. The patient expressed understanding and agreed to proceed.  I discussed the assessment and treatment plan with the patient. The patient was provided an opportunity to ask questions and all were answered. The patient agreed with the plan and demonstrated an understanding of the instructions.   The patient was advised to call back or seek an in-person evaluation if the symptoms worsen or if the condition fails to improve as anticipated.  I provided 25 minutes of non-face-to-face time during this encounter.  The patient was located at home.  The provider was located at [place].   Aloha Gell, NP   Subjective:   Patient ID:  Alicia Sanchez is a 60 y.o. (DOB 1961/10/25) female.  Chief Complaint: No chief complaint on file.   HPI Catheline Hixon presents for follow-up of GAD, MDD, PTSD, insomnia and panic attacks.  Describes mood today as "about the same". Pleasant. Tearful. Mood symptoms - reports depression, anxiety and irritability - "I guess that's my life". Reports a few panic attacks. Mood is consistent. Stating "I think I'm handling things well". Spent the past 4 months off and on in the [employer]. Living alone. Spending time with family. Decreased interest and motivation. Taking medications as prescribed.  Energy levels lower. Active, does not have a regular exercise routine.   Enjoys some usual interests and activities. Married, but separated. Has two daughters and 2 step daughters. Has 3 sisters and 2 brothers. Mother living - talks to her. Spending time with family. Appetite adequate. Weight loss  - 35 pounds - 174 to 139 pounds. Sleeps well most nights. Averages 6 to 8 hours a day. Focus and concentration difficulties. Completing tasks. Managing aspects of household. Disabled since 2020.  Denies SI or HI.  Denies AH or VH. Denies self harm. Denies substance use.  Previous medication trials: Paxil, Wellbutrin, Depakote, Trazadone and others   Review of Systems:  Review of Systems  Musculoskeletal:  Negative for gait problem.  Neurological:  Negative for tremors.  Psychiatric/Behavioral:         Please refer to HPI    Medications: I have reviewed the patient's current medications.  Current Outpatient Medications  Medication Sig Dispense Refill   albuterol (VENTOLIN HFA) 108 (90 Base) MCG/ACT inhaler INHALE 2 PUFFS INTO THE LUNGS EVERY 6 (SIX) HOURS AS NEEDED FOR WHEEZING OR SHORTNESS OF BREATH. 3 each 2   alendronate (FOSAMAX) 70 MG tablet TAKE 1 TABLET EVERY 7 DAYS WITH A FULL GLASS OF WATER ON AN EMPTY STOMACH 12 tablet 3   ALPRAZolam (XANAX) 0.5 MG tablet Take one tablet three times daily and one extra daily as needed for increased anxiety. 120 tablet 0   aspirin EC 81 MG tablet Take 81 mg by mouth daily.     atorvastatin (LIPITOR) 40 MG tablet TAKE 1 TABLET EVERY DAY (NEED MD APPOINTMENT FOR REFILLS) 90 tablet 3   azithromycin (ZITHROMAX) 250 MG tablet Take as directed (Patient not taking: Reported on 03/27/2022) 6 tablet 3   Brexpiprazole (REXULTI) 0.5 MG TABS Take 1 tablet (0.5 mg total) by mouth daily. (Patient not taking: Reported on 03/27/2022) 30 tablet 2  Budeson-Glycopyrrol-Formoterol (BREZTRI AEROSPHERE) 160-9-4.8 MCG/ACT AERO Inhale 2 puffs into the lungs 2 (two) times daily. Please fill #3 inahlers for 3 month supply 32.1 g 11   busPIRone (BUSPAR) 30 MG tablet TAKE 1 TABLET TWICE DAILY 180 tablet 0   calcium carbonate (OS-CAL) 1250 (500 Ca) MG chewable tablet Chew 1 tablet (1,250 mg total) by mouth daily. 100 tablet 3   cetirizine (ZYRTEC) 10 MG tablet Take 1 tablet  by mouth once daily 90 tablet 0   diltiazem (CARDIZEM) 30 MG tablet TAKE 1 TABLET TWICE DAILY AS NEEDED (NEED MD APPOINTMENT) 60 tablet 4   DULoxetine (CYMBALTA) 60 MG capsule TAKE 2 CAPSULES (120 MG TOTAL) EVERY DAY 993 capsule 0   FOLIC ACID PO Take 1 tablet by mouth daily.     gabapentin (NEURONTIN) 300 MG capsule Take 1 capsule (300 mg total) by mouth at bedtime. 90 capsule 3   ipratropium-albuterol (DUONEB) 0.5-2.5 (3) MG/3ML SOLN Take 3 mLs by nebulization every 6 (six) hours as needed. 360 mL 5   meclizine (ANTIVERT) 25 MG tablet TAKE 1 TABLET BY MOUTH THREE TIMES DAILY AS NEEDED FOR DIZZINESS 180 tablet 0   meloxicam (MOBIC) 7.5 MG tablet Take 1 tablet (7.5 mg total) by mouth daily. 90 tablet 3   Multiple Vitamin (MULTIVITAMIN WITH MINERALS) TABS tablet Take 1 tablet by mouth daily. (Patient not taking: Reported on 03/27/2022)     Multiple Vitamins-Minerals (VITAMIN D3 COMPLETE PO) Take 1 tablet by mouth daily.     nitroGLYCERIN (NITROSTAT) 0.4 MG SL tablet Place 1 tablet (0.4 mg total) under the tongue every 5 (five) minutes x 3 doses as needed for chest pain (if o relief after 2nd dose, proceed to the ED for an evaluation or call 911). 25 tablet 1   QUEtiapine (SEROQUEL) 200 MG tablet Take 1 tablet (200 mg total) by mouth at bedtime. 30 tablet 5   Respiratory Therapy Supplies (NEBULIZER/TUBING/MOUTHPIECE) KIT 1 Units by Does not apply route 4 (four) times daily. GIVE face mask and tubing for nebulizer. J44.1 1 kit 0   rOPINIRole (REQUIP) 1 MG tablet Take 1-4 tablets (1-4 mg total) by mouth at bedtime. 360 tablet 3   vitamin B-12 (CYANOCOBALAMIN) 100 MCG tablet Take 100 mcg by mouth daily.     vitamin C (ASCORBIC ACID) 250 MG tablet Take 500-1,000 mg by mouth daily.     No current facility-administered medications for this visit.    Medication Side Effects: None  Allergies:  Allergies  Allergen Reactions   Doxycycline     Chest pain    Past Medical History:  Diagnosis Date    Anxiety    COPD (chronic obstructive pulmonary disease) ([place])    Osteoporosis     Family History  Problem Relation Age of Onset   COPD Mother    COPD Father    Diabetes type II Sister    Kidney failure Sister    Diabetes type II Brother    COPD Maternal Grandfather     Social History   Socioeconomic History   Marital status: Legally Separated    Spouse name: Not on file   Number of children: Not on file   Years of education: Not on file   Highest education level: Not on file  Occupational History   Not on file  Tobacco Use   Smoking status: Former    Packs/day: 0.50    Years: 42.00    Total pack years: 21.00    Types: Cigarettes   Smokeless  tobacco: Never   Tobacco comments:    1/2 pack- 1pack a day  Vaping Use   Vaping Use: Never used  Substance and Sexual Activity   Alcohol use: No   Drug use: No   Sexual activity: Yes    Birth control/protection: Surgical  Other Topics Concern   Not on file  Social History Narrative   Not on file   Social Determinants of Health   Financial Resource Strain: Not on file  Food Insecurity: No Food Insecurity (10/25/2021)   Hunger Vital Sign    Worried About Running Out of Food in the Last Year: Never true    Ran Out of Food in the Last Year: Never true  Transportation Needs: Not on file  Physical Activity: Not on file  Stress: Stress Concern Present (10/25/2021)   [place]    Feeling of Stress : Rather much  Social Connections: Not on file  Intimate Partner Violence: Not At Risk (10/25/2021)   Humiliation, Afraid, Rape, and Kick questionnaire    Fear of Current or Ex-Partner: No    Emotionally Abused: No    Physically Abused: No    Sexually Abused: No    Past Medical History, Surgical history, Social history, and Family history were reviewed and updated as appropriate.   Please see review of systems for further details on the patient's review from  today.   Objective:   Physical Exam:  There were no vitals taken for this visit.  Physical Exam Constitutional:      General: She is not in acute distress. Musculoskeletal:        General: No deformity.  Neurological:     Mental Status: She is alert and oriented to person, place, and time.     Coordination: Coordination normal.  Psychiatric:        Attention and Perception: Attention and perception normal. She does not perceive auditory or visual hallucinations.        Mood and Affect: Mood normal. Mood is not anxious or depressed. Affect is not labile, blunt, angry or inappropriate.        Speech: Speech normal.        Behavior: Behavior normal.        Thought Content: Thought content normal. Thought content is not paranoid or delusional. Thought content does not include homicidal or suicidal ideation. Thought content does not include homicidal or suicidal plan.        Cognition and Memory: Cognition and memory normal.        Judgment: Judgment normal.     Comments: Insight intact     Lab Review:     Component Value Date/Time   NA 142 06/04/2022 1016   K 4.5 06/04/2022 1016   CL 100 06/04/2022 1016   CO2 21 06/04/2022 1016   GLUCOSE 107 (H) 06/04/2022 1016   GLUCOSE 101 (H) 08/08/2021 1355   BUN 12 06/04/2022 1016   CREATININE 0.89 06/04/2022 1016   CALCIUM 10.3 06/04/2022 1016   PROT 6.9 06/04/2022 1016   ALBUMIN 4.4 06/04/2022 1016   AST 30 06/04/2022 1016   ALT 23 06/04/2022 1016   ALKPHOS 68 06/04/2022 1016   BILITOT 0.6 06/04/2022 1016   GFRNONAA >60 08/08/2021 1355   GFRAA 91 06/03/2020 1505       Component Value Date/Time   WBC 8.1 06/04/2022 1016   WBC 12.9 (H) 08/08/2021 1355   RBC 5.36 (H) 06/04/2022 1016   RBC 5.04  08/08/2021 1355   HGB 16.5 (H) 06/04/2022 1016   HCT 48.4 (H) 06/04/2022 1016   PLT 350 06/04/2022 1016   MCV 90 06/04/2022 1016   MCH 30.8 06/04/2022 1016   MCH 31.9 08/08/2021 1355   MCHC 34.1 06/04/2022 1016   MCHC 34.7  08/08/2021 1355   RDW 12.7 06/04/2022 1016   LYMPHSABS 2.8 01/06/2020 1239   MONOABS 0.5 05/25/2018 0602   EOSABS 0.1 01/06/2020 1239   BASOSABS 0.0 01/06/2020 1239    No results found for: "POCLITH", "LITHIUM"   No results found for: "PHENYTOIN", "PHENOBARB", "VALPROATE", "CBMZ"   .res Assessment: Plan:    Plan:  PDMP reviewed  Xanax 0.24m - 4 times daily Seroquel 2037mat hs  PCP: Cymbalta 12063mvery morning Buspar 74m49mD Gabapentin 100mg43mhs - leg/feet cramps  Refer to therapist  RTC 3 months  Patient advised to contact office with any questions, adverse effects, or acute worsening in signs and symptoms.  Discussed potential benefits, risk, and side effects of benzodiazepines to include potential risk of tolerance and dependence, as well as possible drowsiness. Advised patient not to drive if experiencing drowsiness and to take lowest possible effective dose to minimize risk of dependence and tolerance.  Diagnoses and all orders for this visit:  Panic attacks  Insomnia, unspecified type  Moderate episode of recurrent major depressive disorder (HCC)  PTSD (post-traumatic stress disorder)  Generalized anxiety disorder     Please see After Visit Summary for patient specific instructions.  Future Appointments  Date Time Provider [place]/2023 11:40 AM [facility]  10/10/2022  8:30 AM [facility] PHARMACIST [facility] None    No orders of the defined types were placed in this encounter.     -------------------------------

## 2022-08-09 ENCOUNTER — Other Ambulatory Visit: Payer: Self-pay | Admitting: Adult Health

## 2022-08-09 DIAGNOSIS — F410 Panic disorder [episodic paroxysmal anxiety] without agoraphobia: Secondary | ICD-10-CM

## 2022-08-09 DIAGNOSIS — F411 Generalized anxiety disorder: Secondary | ICD-10-CM

## 2022-08-25 ENCOUNTER — Other Ambulatory Visit: Payer: Self-pay | Admitting: Family Medicine

## 2022-08-25 DIAGNOSIS — F411 Generalized anxiety disorder: Secondary | ICD-10-CM

## 2022-09-08 ENCOUNTER — Other Ambulatory Visit: Payer: Self-pay | Admitting: Adult Health

## 2022-09-08 ENCOUNTER — Other Ambulatory Visit: Payer: Self-pay | Admitting: Cardiology

## 2022-09-08 DIAGNOSIS — F411 Generalized anxiety disorder: Secondary | ICD-10-CM

## 2022-09-08 DIAGNOSIS — F410 Panic disorder [episodic paroxysmal anxiety] without agoraphobia: Secondary | ICD-10-CM

## 2022-09-10 NOTE — Telephone Encounter (Signed)
Last filled 9/22 f/u due 11/30

## 2022-09-24 ENCOUNTER — Ambulatory Visit (INDEPENDENT_AMBULATORY_CARE_PROVIDER_SITE_OTHER): Payer: Medicare HMO | Admitting: Pulmonary Disease

## 2022-09-24 ENCOUNTER — Encounter: Payer: Self-pay | Admitting: Pulmonary Disease

## 2022-09-24 VITALS — BP 114/60 | HR 96 | Temp 97.8°F | Ht 64.0 in | Wt 126.4 lb

## 2022-09-24 DIAGNOSIS — Z720 Tobacco use: Secondary | ICD-10-CM

## 2022-09-24 DIAGNOSIS — F17210 Nicotine dependence, cigarettes, uncomplicated: Secondary | ICD-10-CM | POA: Diagnosis not present

## 2022-09-24 DIAGNOSIS — Z2911 Encounter for prophylactic immunotherapy for respiratory syncytial virus (RSV): Secondary | ICD-10-CM

## 2022-09-24 DIAGNOSIS — Z23 Encounter for immunization: Secondary | ICD-10-CM | POA: Diagnosis not present

## 2022-09-24 MED ORDER — AZITHROMYCIN 250 MG PO TABS: ORAL_TABLET | ORAL | 0 refills | Status: DC

## 2022-09-24 MED ORDER — PREDNISONE 20 MG PO TABS: ORAL_TABLET | ORAL | 0 refills | Status: DC

## 2022-09-24 NOTE — Addendum Note (Signed)
Addended by: Elton Sin on: 09/24/2022 11:47 AM   Modules accepted: Orders

## 2022-09-24 NOTE — Progress Notes (Signed)
Alicia Sanchez    [id number]    06-17-1961  Primary Care Physician:Gottschalk, Koleen Distance, DO  Referring Physician: Janora Norlander, DO [place],  [place] [address]  Chief complaint:   Follow-up for COPD  HPI: [name] is a 61 y.o.  active smoker with history of anxiety, depression, COPD.  Initially treated with Memory Dance and Incruse which was consolidated to Trelegy inhaler in 2018  She has daily symptoms of snoring. She does sleep study 4 years ago but does not know the result of this test. She is a diagnosis of restless leg syndrome and is on requip. She did not have the repeat sleep study done as it was not covered by insurance.  Pets:Dog which lives outside the house. No pets, exotic pets. Occupation:Customer sevice rep. previously worked in [employer] Exposures: His current dust exposure and previous exposure to cotton fiber in her line of work Smoking history: 35-pack-year smoking history. Continues to smoke 1 pack per day She quit smoking in 2021 but unfortunately started again due to family stresses  Interim history: Continues on breztri inhaler. Continues to smoke due to personal stressors at home. She is separated from her husband and has lost some weight.  Outpatient Encounter Medications as of 09/24/2022  Medication Sig   albuterol (VENTOLIN HFA) 108 (90 Base) MCG/ACT inhaler INHALE 2 PUFFS INTO THE LUNGS EVERY 6 (SIX) HOURS AS NEEDED FOR WHEEZING OR SHORTNESS OF BREATH.   ALPRAZolam (XANAX) 0.5 MG tablet TAKE 1 TABLET BY MOUTH THREE TIMES DAILY AND  1  EXTRA  TABLET  DAILY  AS  NEEDED  FOR  INCREASED  ANXIETY   atorvastatin (LIPITOR) 40 MG tablet TAKE 1 TABLET EVERY DAY (NEED MD APPOINTMENT FOR REFILLS)   Budeson-Glycopyrrol-Formoterol (BREZTRI AEROSPHERE) 160-9-4.8 MCG/ACT AERO Inhale 2 puffs into the lungs 2 (two) times daily. Please fill #3 inahlers for 3 month supply   busPIRone (BUSPAR) 30 MG tablet TAKE 1 TABLET TWICE DAILY   diltiazem  (CARDIZEM) 30 MG tablet Take 1 tablet (30 mg total) by mouth 2 (two) times daily as needed.   DULoxetine (CYMBALTA) 60 MG capsule TAKE 2 CAPSULES (120 MG TOTAL) EVERY DAY   ipratropium-albuterol (DUONEB) 0.5-2.5 (3) MG/3ML SOLN Take 3 mLs by nebulization every 6 (six) hours as needed.   meclizine (ANTIVERT) 25 MG tablet TAKE 1 TABLET BY MOUTH THREE TIMES DAILY AS NEEDED FOR DIZZINESS   Multiple Vitamins-Minerals (VITAMIN D3 COMPLETE PO) Take 1 tablet by mouth daily.   QUEtiapine (SEROQUEL) 200 MG tablet Take 1 tablet (200 mg total) by mouth at bedtime.   Respiratory Therapy Supplies (NEBULIZER/TUBING/MOUTHPIECE) KIT 1 Units by Does not apply route 4 (four) times daily. GIVE face mask and tubing for nebulizer. J44.1   rOPINIRole (REQUIP) 1 MG tablet Take 1-4 tablets (1-4 mg total) by mouth at bedtime.   alendronate (FOSAMAX) 70 MG tablet TAKE 1 TABLET EVERY 7 DAYS WITH A FULL GLASS OF WATER ON AN EMPTY STOMACH (Patient not taking: Reported on 09/24/2022)   aspirin EC 81 MG tablet Take 81 mg by mouth daily. (Patient not taking: Reported on 09/24/2022)   calcium carbonate (OS-CAL) 1250 (500 Ca) MG chewable tablet Chew 1 tablet (1,250 mg total) by mouth daily. (Patient not taking: Reported on 09/24/2022)   cetirizine (ZYRTEC) 10 MG tablet Take 1 tablet by mouth once daily (Patient not taking: Reported on 63/01/3544)   FOLIC ACID PO Take 1 tablet by mouth daily. (Patient not  taking: Reported on 09/24/2022)   gabapentin (NEURONTIN) 300 MG capsule Take 1 capsule (300 mg total) by mouth at bedtime. (Patient not taking: Reported on 09/24/2022)   meloxicam (MOBIC) 7.5 MG tablet Take 1 tablet (7.5 mg total) by mouth daily. (Patient not taking: Reported on 09/24/2022)   Multiple Vitamin (MULTIVITAMIN WITH MINERALS) TABS tablet Take 1 tablet by mouth daily. (Patient not taking: Reported on 03/27/2022)   nitroGLYCERIN (NITROSTAT) 0.4 MG SL tablet Place 1 tablet (0.4 mg total) under the tongue every 5 (five) minutes x 3  doses as needed for chest pain (if o relief after 2nd dose, proceed to the ED for an evaluation or call 911). (Patient not taking: Reported on 09/24/2022)   vitamin B-12 (CYANOCOBALAMIN) 100 MCG tablet Take 100 mcg by mouth daily. (Patient not taking: Reported on 09/24/2022)   vitamin C (ASCORBIC ACID) 250 MG tablet Take 500-1,000 mg by mouth daily. (Patient not taking: Reported on 09/24/2022)   [DISCONTINUED] azithromycin (ZITHROMAX) 250 MG tablet Take as directed (Patient not taking: Reported on 03/27/2022)   [DISCONTINUED] Brexpiprazole (REXULTI) 0.5 MG TABS Take 1 tablet (0.5 mg total) by mouth daily. (Patient not taking: Reported on 03/27/2022)   No facility-administered encounter medications on file as of 09/24/2022.   Physical Exam: Blood pressure 114/60, pulse 96, temperature 97.8 F (36.6 C), temperature source Oral, height _0  (1.626 m), weight 126 lb 6.4 oz (57.3 kg), SpO2 93 %. Gen:      No acute distress HEENT:  EOMI, sclera anicteric Neck:     No masses; no thyromegaly Lungs:    Clear to auscultation bilaterally; normal respiratory effort CV:         Regular rate and rhythm; no murmurs Abd:      + bowel sounds; soft, non-tender; no palpable masses, no distension Ext:    No edema; adequate peripheral perfusion Skin:      Warm and dry; no rash Neuro: alert and oriented x 3 Psych: normal mood and affect   Data Reviewed: Imaging Screening CT chest 05/08/17-centrilobular emphysema, calcified granuloma, tiny subcentimeter pulmonary nodules. CTA 05/21/2018- emphysema, scattered pulmonary nodule.  4 mm right lower lung nodule is new. I have reviewed the images personally  CTA [facility] [place]) 03/11/2019-no pulmonary embolism.  No active cardiopulmonary disease.  Coronary artery calcification No images available in PACS  Screening CT 12/17/2019-emphysema, tiny calcified pulmonary nodules  CTA 02/09/2020-no PE, lungs are clear with emphysema.  Screening CT 04/03/2021-emphysema, stable  pulmonary nodules.  Screening CT chest 04/03/2022-moderate emphysema, stable lung nodules. I have reviewed the images personally.  PFTs  07/24/17 FVC 2.54 (74%], FEV1 1.55 (58%], F/F 61, TLC 103%, DLCO 64% Moderate obstruction and diffusion impairment with air trapping  08/18/2018 FVC 2.26 [70%], FEV1 1.23 [49%], F/F 54, TLC 5.98 [123%], DLCO 12.55 [56%] Severe obstructive airway disease.  Worse compared to 2018  Labs CBC/1/17-absolute eosinophil count 0 Alpha-1 antitrypsin 04/25/2017-136, PI MM  Assessment:  COPD, chronic bronchitis Currently on breztri, albuterol  Active smoker Continues to smoke We discussed smoking cessation and she wants to attempt again to quit.  She is also vaping and attempt to come off cigarettes. Time spent counseling-5 minutes.  Reassess at return visit.  Continue low-dose screening CT   Restless leg syndrome, suspected sleep apnea On requip for restless leg syndrome.  Sleep study not done as it is not covered by insurance.  Plan/Recommendations: - Continue breztri duo nebs as needed - Low dose screening CT of the chest - Smoking cessation  Marshell Garfinkel MD [facility] 09/24/2022, 10:32 AM  CC: Janora Norlander, DO     Marshell Garfinkel, MD

## 2022-09-24 NOTE — Patient Instructions (Addendum)
I am glad you are stable with your breathing status. Continue the inhalers. Work on smoking cessation We will order Z-Pak and prednisone to be held in reserve  Follow up in 1 year

## 2022-09-27 ENCOUNTER — Encounter: Payer: Self-pay | Admitting: Cardiology

## 2022-09-27 ENCOUNTER — Ambulatory Visit: Payer: Medicare HMO | Attending: Cardiology | Admitting: Cardiology

## 2022-09-27 VITALS — BP 104/64 | HR 82 | Ht 64.5 in | Wt 131.8 lb

## 2022-09-27 DIAGNOSIS — I951 Orthostatic hypotension: Secondary | ICD-10-CM | POA: Diagnosis not present

## 2022-09-27 DIAGNOSIS — E782 Mixed hyperlipidemia: Secondary | ICD-10-CM | POA: Diagnosis not present

## 2022-09-27 DIAGNOSIS — I2510 Atherosclerotic heart disease of native coronary artery without angina pectoris: Secondary | ICD-10-CM

## 2022-09-27 NOTE — Progress Notes (Signed)
Clinical Summary Alicia Sanchez is a 61 y.o.female seen today for follow up of the following medical problems.   1.Syncope - episode 1 week ago - was at home sitting at kitchen table. Just took medications. Stood up and started toward, felt dizzy. Fell to floor, unsure if true LOC. Bladder incontinence. Had phone in her hand, called daughter, family came over. Tried to get up and recurrent symptoms.  - chronic orthostatic symptoms - water 4-5 bottles per day. - when kids got her up, went to bed room and checked bp. 89/73 -orthostatics abnormal, DBP drops 18 points with standing.      - some orthostatic dizziness at times but no recurrent syncope - up and down on her daily hydration.      2.Weight loss - 30 lbs loss, dietary changes and increased activity      3. COPD/DOE - 07/2018 PFTs severe COPD - followed by pulmonary Dr Vaughan Browner  - on home O2 2 L   - at last visit unclear if her COPD was the only cause of her SOB, referred for echo 06/2018 echo LVEF 81-85%, normal diastolic function    - breathing is much improved. Has nearly quit smoking. Wears O2 as needed     4. Palpitations - DOE short distances with palpitations. Can occur at rest, particularly at night - lasts a few minutes. Can feel her herself breathing heavy - occurs daily - coffee x 1-2 cups, no tea, diet mountain dews 530m x 3-4, rare etoh. Ongoing a few years.  - can be better with xanax.  - started on coreg < 1 month, symptoms better with beta blocker. Takes in AM, second dose prn.    06/2018 event monitor benign.  - has prn diltizem, takes occasionally. Overall palpitations tolerablte. Limited dilt dosing due to soft bp's       5. Chest pain - pressure/tightness mid to left chest. Rest or at rest. Mild to moderate pain, lasts about 1 minute - +SOB, can feel anxious - limited exertion due to her chornic COPD.  - recent CT lung cance screen showed CAD    01/2020 coronary CTA: nonobstructive  CAD -07/2021 lexiscan: mild ischemia  6. Hyperlipidemia - 10/2021 TC 134 TG 138 HDL 62 LDL 48 Past Medical History:  Diagnosis Date   Anxiety    COPD (chronic obstructive pulmonary disease) (HCC)    Osteoporosis      Allergies  Allergen Reactions   Doxycycline     Chest pain     Current Outpatient Medications  Medication Sig Dispense Refill   albuterol (VENTOLIN HFA) 108 (90 Base) MCG/ACT inhaler INHALE 2 PUFFS INTO THE LUNGS EVERY 6 (SIX) HOURS AS NEEDED FOR WHEEZING OR SHORTNESS OF BREATH. 3 each 2   alendronate (FOSAMAX) 70 MG tablet TAKE 1 TABLET EVERY 7 DAYS WITH A FULL GLASS OF WATER ON AN EMPTY STOMACH (Patient not taking: Reported on 09/24/2022) 12 tablet 3   ALPRAZolam (XANAX) 0.5 MG tablet TAKE 1 TABLET BY MOUTH THREE TIMES DAILY AND  1  EXTRA  TABLET  DAILY  AS  NEEDED  FOR  INCREASED  ANXIETY 120 tablet 0   aspirin EC 81 MG tablet Take 81 mg by mouth daily. (Patient not taking: Reported on 09/24/2022)     atorvastatin (LIPITOR) 40 MG tablet TAKE 1 TABLET EVERY DAY (NEED MD APPOINTMENT FOR REFILLS) 90 tablet 3   azithromycin (ZITHROMAX) 250 MG tablet Take as directed 6 tablet 0   Budeson-Glycopyrrol-Formoterol (  BREZTRI AEROSPHERE) 160-9-4.8 MCG/ACT AERO Inhale 2 puffs into the lungs 2 (two) times daily. Please fill #3 inahlers for 3 month supply 32.1 g 11   busPIRone (BUSPAR) 30 MG tablet TAKE 1 TABLET TWICE DAILY 180 tablet 10   calcium carbonate (OS-CAL) 1250 (500 Ca) MG chewable tablet Chew 1 tablet (1,250 mg total) by mouth daily. (Patient not taking: Reported on 09/24/2022) 100 tablet 3   cetirizine (ZYRTEC) 10 MG tablet Take 1 tablet by mouth once daily (Patient not taking: Reported on 09/24/2022) 90 tablet 0   diltiazem (CARDIZEM) 30 MG tablet Take 1 tablet (30 mg total) by mouth 2 (two) times daily as needed. 60 tablet 3   DULoxetine (CYMBALTA) 60 MG capsule TAKE 2 CAPSULES (120 MG TOTAL) EVERY DAY 361 capsule 0   FOLIC ACID PO Take 1 tablet by mouth daily. (Patient  not taking: Reported on 09/24/2022)     gabapentin (NEURONTIN) 300 MG capsule Take 1 capsule (300 mg total) by mouth at bedtime. (Patient not taking: Reported on 09/24/2022) 90 capsule 3   ipratropium-albuterol (DUONEB) 0.5-2.5 (3) MG/3ML SOLN Take 3 mLs by nebulization every 6 (six) hours as needed. 360 mL 5   meclizine (ANTIVERT) 25 MG tablet TAKE 1 TABLET BY MOUTH THREE TIMES DAILY AS NEEDED FOR DIZZINESS 180 tablet 0   meloxicam (MOBIC) 7.5 MG tablet Take 1 tablet (7.5 mg total) by mouth daily. (Patient not taking: Reported on 09/24/2022) 90 tablet 3   Multiple Vitamin (MULTIVITAMIN WITH MINERALS) TABS tablet Take 1 tablet by mouth daily. (Patient not taking: Reported on 03/27/2022)     Multiple Vitamins-Minerals (VITAMIN D3 COMPLETE PO) Take 1 tablet by mouth daily.     nitroGLYCERIN (NITROSTAT) 0.4 MG SL tablet Place 1 tablet (0.4 mg total) under the tongue every 5 (five) minutes x 3 doses as needed for chest pain (if o relief after 2nd dose, proceed to the ED for an evaluation or call 911). (Patient not taking: Reported on 09/24/2022) 25 tablet 1   predniSONE (DELTASONE) 20 MG tablet Take 26m for 5 days 10 tablet 0   QUEtiapine (SEROQUEL) 200 MG tablet Take 1 tablet (200 mg total) by mouth at bedtime. 30 tablet 5   Respiratory Therapy Supplies (NEBULIZER/TUBING/MOUTHPIECE) KIT 1 Units by Does not apply route 4 (four) times daily. GIVE face mask and tubing for nebulizer. J44.1 1 kit 0   rOPINIRole (REQUIP) 1 MG tablet Take 1-4 tablets (1-4 mg total) by mouth at bedtime. 360 tablet 3   vitamin B-12 (CYANOCOBALAMIN) 100 MCG tablet Take 100 mcg by mouth daily. (Patient not taking: Reported on 09/24/2022)     vitamin C (ASCORBIC ACID) 250 MG tablet Take 500-1,000 mg by mouth daily. (Patient not taking: Reported on 09/24/2022)     No current facility-administered medications for this visit.     Past Surgical History:  Procedure Laterality Date   ABDOMINAL HYSTERECTOMY       Allergies  Allergen  Reactions   Doxycycline     Chest pain      Family History  Problem Relation Age of Onset   COPD Mother    COPD Father    Diabetes type II Sister    Kidney failure Sister    Diabetes type II Brother    COPD Maternal Grandfather      Social History Ms. RMalkiewiczreports that she has been smoking cigarettes. She has a 21.00 pack-year smoking history. She has never used smokeless tobacco. Ms. RWhitingreports no history of alcohol  use.   Review of Systems CONSTITUTIONAL: No weight loss, fever, chills, weakness or fatigue.  HEENT: Eyes: No visual loss, blurred vision, double vision or yellow sclerae.No hearing loss, sneezing, congestion, runny nose or sore throat.  SKIN: No rash or itching.  CARDIOVASCULAR: per hpi RESPIRATORY: No shortness of breath, cough or sputum.  GASTROINTESTINAL: No anorexia, nausea, vomiting or diarrhea. No abdominal pain or blood.  GENITOURINARY: No burning on urination, no polyuria NEUROLOGICAL: No headache, dizziness, syncope, paralysis, ataxia, numbness or tingling in the extremities. No change in bowel or bladder control.  MUSCULOSKELETAL: No muscle, back pain, joint pain or stiffness.  LYMPHATICS: No enlarged nodes. No history of splenectomy.  PSYCHIATRIC: No history of depression or anxiety.  ENDOCRINOLOGIC: No reports of sweating, cold or heat intolerance. No polyuria or polydipsia.  Marland Kitchen   Physical Examination Today's Vitals   09/27/22 1140  BP: 104/64  Pulse: 82  SpO2: 94%  Weight: 131 lb 12.8 oz (59.8 kg)  Height: 5' 4.5" (1.638 m)   Body mass index is 22.27 kg/m.  Gen: resting comfortably, no acute distress HEENT: no scleral icterus, pupils equal round and reactive, no palptable cervical adenopathy,  CV: RRR, no m/r/g no jvd Resp: Clear to auscultation bilaterally GI: abdomen is soft, non-tender, non-distended, normal bowel sounds, no hepatosplenomegaly MSK: extremities are warm, no edema.  Skin: warm, no rash Neuro:  no focal  deficits Psych: appropriate affect   Diagnostic Studies  Jan 2017 Study Conclusions   - Left ventricle: The cavity size was normal. Systolic function was   vigorous. The estimated ejection fraction was in the range of 65%   to 70%. Wall motion was normal; there were no regional wall   motion abnormalities. Doppler parameters are consistent with   abnormal left ventricular relaxation (grade 1 diastolic   dysfunction). There was no evidence of elevated ventricular   filling pressure by Doppler parameters. - Aortic valve: There was no regurgitation. - Aortic root: The aortic root was normal in size. - Mitral valve: Structurally normal valve. There was mild   regurgitation. - Right ventricle: The cavity size was normal. Wall thickness was   normal. Systolic function was normal. - Right atrium: The atrium was normal in size. - Tricuspid valve: There was mild regurgitation. - Pulmonic valve: There was no regurgitation. - Pulmonary arteries: Systolic pressure was within the normal   range. - Inferior vena cava: The vessel was normal in size. - Pericardium, extracardiac: There was no pericardial effusion.     06/2018 echo Study Conclusions   - Left ventricle: The cavity size was normal. Wall thickness was   normal. Systolic function was normal. The estimated ejection   fraction was in the range of 60% to 65%. Wall motion was normal;   there were no regional wall motion abnormalities. Left   ventricular diastolic function parameters were normal. - Aortic valve: Mildly calcified annulus. Probably trileaflet. - Mitral valve: Mildly calcified annulus. There was trivial   regurgitation. - Right ventricle: The cavity size was mildly dilated. - Right atrium: Central venous pressure (est): 3 mm Hg. - Atrial septum: No defect or patent foramen ovale was identified. - Tricuspid valve: There was trivial regurgitation. - Pericardium, extracardiac: A prominent pericardial fat pad was    present.   06/2018 event monitor 7 day event monitor Min HR 76, Max HR 143, Avg HR 93 Symptoms correlate with sinus rhythm and sinus tachycardia No significant arrhythmias     01/2020 coronary CTA IMPRESSION: 1. Coronary artery  calcium score 45 Agatston units. This places the patient in the 85th percentile for age and gender, suggesting high risk for future cardiac events.   2.  Nonobstructive mild CAD noted.   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  - in review seroquel 2-7% association with orhostatic hypotension perhaps as high as 18%. There is also association with requip up to 25% - overall symptoms mild, dizziness at times without recurrent syncope - contineu aggressive hydration, increased sodium intake  2. CHest pain/Coronary atherosclerosis - no evidence of significant obstructive disease - continue risk factor modification  3. Palpitaitons - overall controlled with prn dilt, limited soding due to low normal bp's  4. Hyperlipidemia - at goal, continue current meds      Arnoldo Lenis, M.D.

## 2022-09-27 NOTE — Patient Instructions (Signed)

## 2022-10-09 ENCOUNTER — Other Ambulatory Visit: Payer: Self-pay | Admitting: Adult Health

## 2022-10-09 DIAGNOSIS — F410 Panic disorder [episodic paroxysmal anxiety] without agoraphobia: Secondary | ICD-10-CM

## 2022-10-09 DIAGNOSIS — F411 Generalized anxiety disorder: Secondary | ICD-10-CM

## 2022-10-10 ENCOUNTER — Ambulatory Visit: Payer: Medicare HMO | Admitting: Pharmacist

## 2022-10-10 ENCOUNTER — Telehealth: Payer: Self-pay | Admitting: Pharmacist

## 2022-10-10 DIAGNOSIS — J439 Emphysema, unspecified: Secondary | ICD-10-CM

## 2022-10-10 DIAGNOSIS — J410 Simple chronic bronchitis: Secondary | ICD-10-CM

## 2022-10-10 MED ORDER — BREZTRI AEROSPHERE 160-9-4.8 MCG/ACT IN AERO: 2.0000 | INHALATION_SPRAY | Freq: Two times a day (BID) | RESPIRATORY_TRACT | 11 refills | Status: DC

## 2022-10-10 NOTE — Telephone Encounter (Signed)
Please re-enroll patient for breztri/az&me patient assistance Escribed to [employer]  Thank you!

## 2022-10-10 NOTE — Telephone Encounter (Signed)
Please schedule appt

## 2022-10-10 NOTE — Telephone Encounter (Signed)
Filled 10/23 seen in August due back this month

## 2022-10-17 NOTE — Progress Notes (Signed)
  Care Management   Follow Up Note   10/10/2022 Name: Cydnee Fuquay MRN: [id number] DOB: Jun 27, 1961   Referred by: [name], DO Reason for referral : breztri patient assistance   An unsuccessful telephone outreach was attempted today. The patient was referred to the case management team for assistance with care management and care coordination.     Kieth Brightly, PharmD, BCPS Clinical Pharmacist, [facility]  II Phone [phone]

## 2022-10-18 NOTE — Telephone Encounter (Signed)
MAILED APPLICATION TO PT HOME

## 2022-10-19 NOTE — Telephone Encounter (Signed)
LVM to scheduled f/u

## 2022-10-22 ENCOUNTER — Encounter: Payer: Self-pay | Admitting: Adult Health

## 2022-10-22 ENCOUNTER — Ambulatory Visit: Payer: Medicare HMO | Admitting: Adult Health

## 2022-10-22 DIAGNOSIS — F410 Panic disorder [episodic paroxysmal anxiety] without agoraphobia: Secondary | ICD-10-CM | POA: Diagnosis not present

## 2022-10-22 DIAGNOSIS — F411 Generalized anxiety disorder: Secondary | ICD-10-CM

## 2022-10-22 DIAGNOSIS — F331 Major depressive disorder, recurrent, moderate: Secondary | ICD-10-CM | POA: Diagnosis not present

## 2022-10-22 DIAGNOSIS — F4310 Post-traumatic stress disorder, unspecified: Secondary | ICD-10-CM | POA: Diagnosis not present

## 2022-10-22 DIAGNOSIS — G4700 Insomnia, unspecified: Secondary | ICD-10-CM

## 2022-10-22 MED ORDER — ALPRAZOLAM 0.5 MG PO TABS: ORAL_TABLET | ORAL | 2 refills | Status: DC

## 2022-10-22 MED ORDER — QUETIAPINE FUMARATE 200 MG PO TABS: 200.0000 mg | ORAL_TABLET | Freq: Every day | ORAL | 5 refills | Status: DC

## 2022-10-22 NOTE — Progress Notes (Signed)
Alicia Sanchez [id number] 03-14-1961 61 y.o.  Subjective:   Patient ID:  Alicia Sanchez is a 62 y.o. (DOB 12/09/1960) female.  Chief Complaint: No chief complaint on file.   HPI Alicia Sanchez presents to the office today for follow-up of GAD, MDD, PTSD, insomnia and panic attacks.  Describes mood today as "about the same". Pleasant. Tearful. Mood symptoms - reports depression - "always during the holidays". Reports some anxiety and irritability. Reports panic attacks. Mood is consistent "for the most part". Stating "I have been doing good". Spending time with family. Stable interest and motivation. Taking medications prescribed.                                                                                      Energy levels "so-so". Active, does not have a regular exercise routine.   Enjoys some usual interests and activities. Married, but separated. Has two daughters and 2 step daughters. Has 3 sisters and 2 brothers. Mother living - talks to her. Spending time with family. Appetite adequate. Weight loss - 50 pounds - 174 to 125 pounds. Sleeps varies. Averages 4 to 5 hours a day. Focus and concentration stable - "for the most part". Completing tasks. Managing aspects of household. Disabled since 2020.  Denies SI or HI.  Denies AH or VH. Denies self harm. Denies substance use.  Previous medication trials: Paxil, Wellbutrin, Depakote, Trazadone and others   [place] Office Visit from 11/21/2021 in [place] Visit from 10/25/2021 in [place] Visit from 07/31/2021 in [place] Visit from 05/19/2021 in [place] Visit from 02/24/2021 in [place]  Total GAD-7 Score _0 Mini-Mental    [place] [facility] Visit from 10/25/2021 in [place]  Total Score (max 30 points ) 30      PHQ2-9     [place] Visit from 11/21/2021 in [place] [facility] Visit from 10/25/2021 in [place] [facility] Visit from 07/31/2021 in [place] Visit from 05/19/2021 in [facility] Visit from 02/24/2021 in [place]  PHQ-2 Total Score _1 PHQ-9 Total Score _2 Flowsheet Row ED from 08/08/2021 in [place] No Risk        Review of Systems:  Review of Systems  Musculoskeletal:  Negative for gait problem.  Neurological:  Negative for tremors.  Psychiatric/Behavioral:         Please refer to HPI    Medications: I have reviewed the patient's current medications.  Current Outpatient Medications  Medication Sig Dispense Refill   albuterol (VENTOLIN HFA) 108 (90 Base) MCG/ACT inhaler INHALE 2 PUFFS INTO THE LUNGS EVERY 6 (SIX) HOURS AS NEEDED FOR WHEEZING OR SHORTNESS OF BREATH. 3 each 2   alendronate (FOSAMAX) 70 MG tablet TAKE 1 TABLET EVERY 7 DAYS WITH A FULL GLASS OF WATER ON AN EMPTY STOMACH 12 tablet  3   ALPRAZolam (XANAX) 0.5 MG tablet TAKE 1 TABLET BY MOUTH THREE TIMES DAILY AND  1  EXTRA  PRN  FOR  INCREASED  ANXIETY 120 tablet 2   aspirin EC 81 MG tablet Take 81 mg by mouth daily.     atorvastatin (LIPITOR) 40 MG tablet TAKE 1 TABLET EVERY DAY (NEED MD APPOINTMENT FOR REFILLS) 90 tablet 3   azithromycin (ZITHROMAX) 250 MG tablet Take as directed 6 tablet 0   Budeson-Glycopyrrol-Formoterol (BREZTRI AEROSPHERE) 160-9-4.8 MCG/ACT AERO Inhale 2 puffs into the lungs 2 (two) times daily. Please fill #3 inahlers for 3 month supply 32.1 g 11   busPIRone (BUSPAR) 30 MG tablet TAKE 1 TABLET TWICE DAILY 180 tablet 10   calcium carbonate (OS-CAL) 1250 (500 Ca) MG chewable tablet Chew 1 tablet (1,250 mg total) by mouth daily. 100 tablet 3   cetirizine (ZYRTEC) 10 MG tablet Take 1 tablet by mouth once  daily 90 tablet 0   diltiazem (CARDIZEM) 30 MG tablet Take 1 tablet (30 mg total) by mouth 2 (two) times daily as needed. 60 tablet 3   DULoxetine (CYMBALTA) 60 MG capsule TAKE 2 CAPSULES (120 MG TOTAL) EVERY DAY 680 capsule 0   FOLIC ACID PO Take 1 tablet by mouth daily.     gabapentin (NEURONTIN) 300 MG capsule Take 1 capsule (300 mg total) by mouth at bedtime. 90 capsule 3   ipratropium-albuterol (DUONEB) 0.5-2.5 (3) MG/3ML SOLN Take 3 mLs by nebulization every 6 (six) hours as needed. 360 mL 5   meclizine (ANTIVERT) 25 MG tablet TAKE 1 TABLET BY MOUTH THREE TIMES DAILY AS NEEDED FOR DIZZINESS 180 tablet 0   meloxicam (MOBIC) 7.5 MG tablet Take 1 tablet (7.5 mg total) by mouth daily. 90 tablet 3   Multiple Vitamin (MULTIVITAMIN WITH MINERALS) TABS tablet Take 1 tablet by mouth daily.     Multiple Vitamins-Minerals (VITAMIN D3 COMPLETE PO) Take 1 tablet by mouth daily.     nitroGLYCERIN (NITROSTAT) 0.4 MG SL tablet Place 1 tablet (0.4 mg total) under the tongue every 5 (five) minutes x 3 doses as needed for chest pain (if o relief after 2nd dose, proceed to the ED for an evaluation or call 911). 25 tablet 1   predniSONE (DELTASONE) 20 MG tablet Take 45m for 5 days 10 tablet 0   QUEtiapine (SEROQUEL) 200 MG tablet Take 1 tablet (200 mg total) by mouth at bedtime. 30 tablet 5   Respiratory Therapy Supplies (NEBULIZER/TUBING/MOUTHPIECE) KIT 1 Units by Does not apply route 4 (four) times daily. GIVE face mask and tubing for nebulizer. J44.1 1 kit 0   rOPINIRole (REQUIP) 1 MG tablet Take 1-4 tablets (1-4 mg total) by mouth at bedtime. 360 tablet 3   vitamin B-12 (CYANOCOBALAMIN) 100 MCG tablet Take 100 mcg by mouth daily.     vitamin C (ASCORBIC ACID) 250 MG tablet Take 500-1,000 mg by mouth daily.     No current facility-administered medications for this visit.    Medication Side Effects: None  Allergies:  Allergies  Allergen Reactions   Doxycycline     Chest pain    Past Medical  History:  Diagnosis Date   Anxiety    COPD (chronic obstructive pulmonary disease) ([facility]aks    Osteoporosis     Past Medical History, Surgical history, Social history, and Family history were reviewed and updated as appropriate.   Please see review of systems for further details on the patient's review from today.   Objective:  Physical Exam:  There were no vitals taken for this visit.  Physical Exam Constitutional:      General: She is not in acute distress. Musculoskeletal:        General: No deformity.  Neurological:     Mental Status: She is alert and oriented to person, place, and time.     Coordination: Coordination normal.  Psychiatric:        Attention and Perception: Attention and perception normal. She does not perceive auditory or visual hallucinations.        Mood and Affect: Mood normal. Mood is not anxious or depressed. Affect is not labile, blunt, angry or inappropriate.        Speech: Speech normal.        Behavior: Behavior normal.        Thought Content: Thought content normal. Thought content is not paranoid or delusional. Thought content does not include homicidal or suicidal ideation. Thought content does not include homicidal or suicidal plan.        Cognition and Memory: Cognition and memory normal.        Judgment: Judgment normal.     Comments: Insight intact     Lab Review:     Component Value Date/Time   NA 142 06/04/2022 1016   K 4.5 06/04/2022 1016   CL 100 06/04/2022 1016   CO2 21 06/04/2022 1016   GLUCOSE 107 (H) 06/04/2022 1016   GLUCOSE 101 (H) 08/08/2021 1355   BUN 12 06/04/2022 1016   CREATININE 0.89 06/04/2022 1016   CALCIUM 10.3 06/04/2022 1016   PROT 6.9 06/04/2022 1016   ALBUMIN 4.4 06/04/2022 1016   AST 30 06/04/2022 1016   ALT 23 06/04/2022 1016   ALKPHOS 68 06/04/2022 1016   BILITOT 0.6 06/04/2022 1016   GFRNONAA >60 08/08/2021 1355   GFRAA 91 06/03/2020 1505       Component Value Date/Time   WBC 8.1 06/04/2022 1016    WBC 12.9 (H) 08/08/2021 1355   RBC 5.36 (H) 06/04/2022 1016   RBC 5.04 08/08/2021 1355   HGB 16.5 (H) 06/04/2022 1016   HCT 48.4 (H) 06/04/2022 1016   PLT 350 06/04/2022 1016   MCV 90 06/04/2022 1016   MCH 30.8 06/04/2022 1016   MCH 31.9 08/08/2021 1355   MCHC 34.1 06/04/2022 1016   MCHC 34.7 08/08/2021 1355   RDW 12.7 06/04/2022 1016   LYMPHSABS 2.8 01/06/2020 1239   MONOABS 0.5 05/25/2018 0602   EOSABS 0.1 01/06/2020 1239   BASOSABS 0.0 01/06/2020 1239    No results found for: "POCLITH", "LITHIUM"   No results found for: "PHENYTOIN", "PHENOBARB", "VALPROATE", "CBMZ"   .res Assessment: Plan:    Plan:  PDMP reviewed  Xanax 0.39m - 4 times daily Seroquel 2044mat hs  PCP: Cymbalta 12046mvery morning Buspar 72m53mD Gabapentin 100mg60mhs - leg/feet cramps  Refer to therapist  RTC 3 months  Patient advised to contact office with any questions, adverse effects, or acute worsening in signs and symptoms.  Discussed potential benefits, risk, and side effects of benzodiazepines to include potential risk of tolerance and dependence, as well as possible drowsiness. Advised patient not to drive if experiencing drowsiness and to take lowest possible effective dose to minimize risk of dependence and tolerance.  Diagnoses and all orders for this visit:  Moderate episode of recurrent major depressive disorder (HCC)  Generalized anxiety disorder -     QUEtiapine (SEROQUEL) 200 MG tablet; Take 1 tablet (200 mg total)  by mouth at bedtime. -     ALPRAZolam (XANAX) 0.5 MG tablet; TAKE 1 TABLET BY MOUTH THREE TIMES DAILY AND  1  EXTRA  PRN  FOR  INCREASED  ANXIETY  Panic attacks -     QUEtiapine (SEROQUEL) 200 MG tablet; Take 1 tablet (200 mg total) by mouth at bedtime. -     ALPRAZolam (XANAX) 0.5 MG tablet; TAKE 1 TABLET BY MOUTH THREE TIMES DAILY AND  1  EXTRA  PRN  FOR  INCREASED  ANXIETY  Insomnia, unspecified type -     QUEtiapine (SEROQUEL) 200 MG tablet; Take 1  tablet (200 mg total) by mouth at bedtime.  PTSD (post-traumatic stress disorder)     Please see After Visit Summary for patient specific instructions.  Future Appointments  Date Time Provider [place]  01/16/2023 11:15 AM Janora Norlander, DO [facility] None  04/11/2023 11:40 AM Branch, Alphonse Guild, MD [facility]    No orders of the defined types were placed in this encounter.   -------------------------------

## 2022-11-28 ENCOUNTER — Other Ambulatory Visit: Payer: Self-pay | Admitting: Family Medicine

## 2022-11-28 ENCOUNTER — Ambulatory Visit: Payer: Medicare PPO | Admitting: Family Medicine

## 2022-11-28 ENCOUNTER — Ambulatory Visit (HOSPITAL_COMMUNITY)
Admission: RE | Admit: 2022-11-28 | Discharge: 2022-11-28 | Disposition: A | Discharge status: Home / Self Care | Payer: Medicare PPO | Source: Ambulatory Visit | Attending: Family Medicine | Admitting: Family Medicine

## 2022-11-28 ENCOUNTER — Encounter: Payer: Self-pay | Admitting: Family Medicine

## 2022-11-28 VITALS — BP 125/87 | HR 96 | Temp 97.0°F | Ht 64.5 in | Wt 122.2 lb

## 2022-11-28 DIAGNOSIS — R0602 Shortness of breath: Secondary | ICD-10-CM

## 2022-11-28 DIAGNOSIS — R0789 Other chest pain: Secondary | ICD-10-CM

## 2022-11-28 DIAGNOSIS — R634 Abnormal weight loss: Secondary | ICD-10-CM

## 2022-11-28 DIAGNOSIS — J439 Emphysema, unspecified: Secondary | ICD-10-CM | POA: Diagnosis not present

## 2022-11-28 DIAGNOSIS — R062 Wheezing: Secondary | ICD-10-CM

## 2022-11-28 LAB — BAYER DCA HB A1C WAIVED: HB A1C (BAYER DCA - WAIVED): 5.5 % (ref 4.8–5.6)

## 2022-11-28 MED ORDER — IPRATROPIUM-ALBUTEROL 0.5-2.5 (3) MG/3ML IN SOLN
3.0000 mL | Freq: Once | RESPIRATORY_TRACT | Status: AC
  Administered 2022-11-28: 3 mL via RESPIRATORY_TRACT

## 2022-11-28 MED ORDER — AZITHROMYCIN 250 MG PO TABS: ORAL_TABLET | ORAL | 0 refills | Status: DC

## 2022-11-28 MED ORDER — PREDNISONE 20 MG PO TABS: ORAL_TABLET | ORAL | 0 refills | Status: DC

## 2022-11-28 MED ORDER — METHYLPREDNISOLONE ACETATE 40 MG/ML IJ SUSP
40.0000 mg | Freq: Once | INTRAMUSCULAR | Status: AC
  Administered 2022-11-28: 40 mg via INTRAMUSCULAR

## 2022-11-28 NOTE — Addendum Note (Signed)
Addended by: Nigel Berthold C on: 11/28/2022 11:27 AM   Modules accepted: Orders

## 2022-11-28 NOTE — Progress Notes (Signed)
I have separately seen and examined the patient. I have discussed the findings and exam with student Dr Stacey Drain and agree with the below note.  My changes/additions are outlined in BLUE.    S: Has had almost a 10 pound weight loss over the last couple of months without trying.  She reports worsening shortness of breath.  No hemoptysis.  She is having to use her DuoNebs much more frequently in efforts to breathe.  She had oxygen saturation as low as 82% at home.  She does have oxygen at home and has been needing it more.  She has not yet reached out to her pulmonologist.  Last COPD exacerbation was in November.  She had a CAT scan done in May which did not show any masses.  Next screening CAT scan is not due until this May.  Followed by Dr. Vaughan Browner and on triple therapy for COPD.  O: Vitals:   11/28/22 1018  BP: 125/87  Pulse: 96  Temp: (!) 97 F (36.1 C)  SpO2: 90%    Gen: nontoxic female, appears anxious Cardio: RRR, S1S2 heard. Pulm: Globally decreased breath sounds.  Mild expiratory wheezes noted at the apex.  After DuoNeb performed she has improved air movement with global expiratory wheezes noted. Chest: Tenderness to palp over the xiphoid process  A/P:  Unexplained weight loss - Plan: CT Chest Wo Contrast, CMP14+EGFR, CBC, TSH, T4, Free, Bayer DCA Hb A1c Waived  Shortness of breath - Plan: CT Chest Wo Contrast, CMP14+EGFR, CBC, TSH, T4, Free, Bayer DCA Hb A1c Waived, EKG 12-Lead, predniSONE (DELTASONE) 20 MG tablet, azithromycin (ZITHROMAX) 250 MG tablet, methylPREDNISolone acetate (DEPO-MEDROL) injection 40 mg  Atypical chest pain - Plan: EKG 12-Lead  Very concerned about this unexplained weight loss.  Will obtain metabolic labs I am going to obtain stat CT chest to rule out any malignancy in this active smoker.  She was given a DuoNeb today which did improve some of her aeration.  I am going to reach out to her pulmonologist for further instructions but for now I am going to treat  her as a COPD exacerbation while we await on imaging results which will hopefully rule out any malignancy.  EKG obtained which demonstrated no changes from the May 2023 EKG.  Shreya Lacasse M. Lajuana Ripple, [facility]   -------------------------------------------------------------------------------------------------------------------------------------------------------------------------------------     Subjective: [id number] PCP: Janora Norlander, DO [id number] [place] is a 62 y.o. female presenting to clinic today for worsening shortness of breath and weight loss.   Ms. Biernacki reports that she has worsening shortness of breath over the past two weeks with significantly increased mucus production. No sick contacts are reported. She experiences chest pain, headache, fatigue, and breathlessness when walking short distances. The increase in mucus makes Ms. Colantuono nauseous, and she finds some relief by inducing vomiting to clear the mucus. She is not experiencing fevers, night sweats, or chills. There are no changes to her vision, hearing, or myalgias. Ms. Vandevander has unintentional weight loss over the past month, from 127 to 118 pounds. She notes tenderness to palpation at the tip of her xiphoid process. The chest pain is described as dull and worsens with coughing and activity, without radiating to her arm. She denies palpitations or syncope. At home, she monitors her oxygen and reports the lowest reading observed has been 82% FiO2.  ROS: Per HPI  Allergies  Allergen Reactions   Doxycycline     Chest pain   Past Medical History:  Diagnosis  Date   Anxiety    COPD (chronic obstructive pulmonary disease) (HCC)    Osteoporosis     Current Outpatient Medications:    albuterol (VENTOLIN HFA) 108 (90 Base) MCG/ACT inhaler, INHALE 2 PUFFS INTO THE LUNGS EVERY 6 (SIX) HOURS AS NEEDED FOR WHEEZING OR SHORTNESS OF BREATH., Disp: 3 each, Rfl: 2   alendronate (FOSAMAX) 70 MG  tablet, TAKE 1 TABLET EVERY 7 DAYS WITH A FULL GLASS OF WATER ON AN EMPTY STOMACH, Disp: 12 tablet, Rfl: 3   ALPRAZolam (XANAX) 0.5 MG tablet, TAKE 1 TABLET BY MOUTH THREE TIMES DAILY AND  1  EXTRA  PRN  FOR  INCREASED  ANXIETY, Disp: 120 tablet, Rfl: 2   aspirin EC 81 MG tablet, Take 81 mg by mouth daily., Disp: , Rfl:    atorvastatin (LIPITOR) 40 MG tablet, TAKE 1 TABLET EVERY DAY (NEED MD APPOINTMENT FOR REFILLS), Disp: 90 tablet, Rfl: 3   Budeson-Glycopyrrol-Formoterol (BREZTRI AEROSPHERE) 160-9-4.8 MCG/ACT AERO, Inhale 2 puffs into the lungs 2 (two) times daily. Please fill #3 inahlers for 3 month supply, Disp: 32.1 g, Rfl: 11   busPIRone (BUSPAR) 30 MG tablet, TAKE 1 TABLET TWICE DAILY, Disp: 180 tablet, Rfl: 10   calcium carbonate (OS-CAL) 1250 (500 Ca) MG chewable tablet, Chew 1 tablet (1,250 mg total) by mouth daily., Disp: 100 tablet, Rfl: 3   cetirizine (ZYRTEC) 10 MG tablet, Take 1 tablet by mouth once daily, Disp: 90 tablet, Rfl: 0   diltiazem (CARDIZEM) 30 MG tablet, Take 1 tablet (30 mg total) by mouth 2 (two) times daily as needed., Disp: 60 tablet, Rfl: 3   DULoxetine (CYMBALTA) 60 MG capsule, TAKE 2 CAPSULES (120 MG TOTAL) EVERY DAY, Disp: 180 capsule, Rfl: 0   FOLIC ACID PO, Take 1 tablet by mouth daily., Disp: , Rfl:    gabapentin (NEURONTIN) 300 MG capsule, Take 1 capsule (300 mg total) by mouth at bedtime., Disp: 90 capsule, Rfl: 3   ipratropium-albuterol (DUONEB) 0.5-2.5 (3) MG/3ML SOLN, Take 3 mLs by nebulization every 6 (six) hours as needed., Disp: 360 mL, Rfl: 5   meclizine (ANTIVERT) 25 MG tablet, TAKE 1 TABLET BY MOUTH THREE TIMES DAILY AS NEEDED FOR DIZZINESS, Disp: 180 tablet, Rfl: 0   meloxicam (MOBIC) 7.5 MG tablet, Take 1 tablet (7.5 mg total) by mouth daily., Disp: 90 tablet, Rfl: 3   Multiple Vitamin (MULTIVITAMIN WITH MINERALS) TABS tablet, Take 1 tablet by mouth daily., Disp: , Rfl:    Multiple Vitamins-Minerals (VITAMIN D3 COMPLETE PO), Take 1 tablet by mouth  daily., Disp: , Rfl:    nitroGLYCERIN (NITROSTAT) 0.4 MG SL tablet, Place 1 tablet (0.4 mg total) under the tongue every 5 (five) minutes x 3 doses as needed for chest pain (if o relief after 2nd dose, proceed to the ED for an evaluation or call 911)., Disp: 25 tablet, Rfl: 1   QUEtiapine (SEROQUEL) 200 MG tablet, Take 1 tablet (200 mg total) by mouth at bedtime., Disp: 30 tablet, Rfl: 5   Respiratory Therapy Supplies (NEBULIZER/TUBING/MOUTHPIECE) KIT, 1 Units by Does not apply route 4 (four) times daily. GIVE face mask and tubing for nebulizer. J44.1, Disp: 1 kit, Rfl: 0   rOPINIRole (REQUIP) 1 MG tablet, Take 1-4 tablets (1-4 mg total) by mouth at bedtime., Disp: 360 tablet, Rfl: 3   vitamin B-12 (CYANOCOBALAMIN) 100 MCG tablet, Take 100 mcg by mouth daily., Disp: , Rfl:    vitamin C (ASCORBIC ACID) 250 MG tablet, Take 500-1,000 mg by mouth daily., Disp: ,  Rfl:  Social History   Socioeconomic History   Marital status: Legally Separated    Spouse name: Not on file   Number of children: Not on file   Years of education: Not on file   Highest education level: Not on file  Occupational History   Not on file  Tobacco Use   Smoking status: Every Day    Packs/day: 0.50    Years: 42.00    Total pack years: 21.00    Types: Cigarettes   Smokeless tobacco: Never   Tobacco comments:    1/2 pack 09/24/22  Vaping Use   Vaping Use: Every day  Substance and Sexual Activity   Alcohol use: No   Drug use: No   Sexual activity: Yes    Birth control/protection: Surgical  Other Topics Concern   Not on file  Social History Narrative   Not on file   Social Determinants of Health   Financial Resource Strain: Not on file  Food Insecurity: No Food Insecurity (10/25/2021)   Hunger Vital Sign    Worried About Running Out of Food in the Last Year: Never true    Ran Out of Food in the Last Year: Never true  Transportation Needs: Not on file  Physical Activity: Not on file  Stress: Stress Concern  Present (10/25/2021)   [place]    Feeling of Stress : Rather much  Social Connections: Not on file  Intimate Partner Violence: Not At Risk (10/25/2021)   Humiliation, Afraid, Rape, and Kick questionnaire    Fear of Current or Ex-Partner: No    Emotionally Abused: No    Physically Abused: No    Sexually Abused: No   Family History  Problem Relation Age of Onset   COPD Mother    COPD Father    Diabetes type II Sister    Kidney failure Sister    Diabetes type II Brother    COPD Maternal Grandfather     Objective: Office vital signs reviewed. BP 125/87   Pulse 96   Temp (!) 97 F (36.1 C) (Temporal)   Ht 5' 4.5" (1.638 m)   Wt 122 lb 3.2 oz (55.4 kg)   SpO2 90%   BMI 20.65 kg/m   General: Awake, alert, pleasant. Appears thin and chronically ill.  Cardio: regular rate and rhythm, S1S2 heard, no murmurs appreciated. Distant heart sounds, soft systolic murmur over Erb's point.  Pulmonary:     Effort: Tachypnea, accessory muscle usage, prolonged expiration and respiratory distress present.     Breath sounds: Decreased air movement and transmitted upper airway sounds present. Wheezing present.  GI: soft, non-tender, non-distended, bowel sounds present x4, no hepatomegaly, no splenomegaly, no masses. Xiphoid process is tender to palpation.  Extremities: warm, well perfused, No edema, cyanosis or clubbing; +1 pulses bilaterally Skin: dry; intact; no rashes or lesions  Assessment/ Plan: 62 y.o. female   The previous lung CT revealed aortic atherosclerosis, a normal heart size with trace pericardial fluid or anterior thickening, and calcification in the LAD and LCX. Emphysema, coronary artery atherosclerosis, and aortic atherosclerosis were also identified. The patient is currently on an appropriate COPD therapy regimen, including albuterol, budesonide, formoterol, and ipratropium.  Recent onset of worsening  shortness of breath, cough, and increased mucus production over the past one to two weeks with no fevers, chills, or night sweats suggests a potential COPD exacerbation. Patient reporting no recent viral illness. Significant weight loss (from 127 to 118  in one month) and persistent cough raises concerns about malignancy, particularly considering the patient's smoking history. While weight loss could be a consequence of the natural progression of emphysema-type COPD, a thorough investigation is warranted.   EKG shows right axis deviation consistent with long-standing pulmonary disease. No ST elevations suggesting infarct at this time. No significant changes from previous EKG in May 2023.   To address the exacerbation, I will prescribe prednisone and advise the patient to follow up with her pulmonology doctors for further management. Additionally, I plan to order a chest CT to rule out malignancy. Labs to monitor for anemia, white count, electrolyte abnormalities include CBC, CMP, T4, and TSH.   Orders Placed This Encounter  Procedures   CT Chest Wo Contrast    Standing Status:   Future    Standing Expiration Date:   11/29/2023    Order Specific Question:   Preferred imaging location?    Answer:   [facility]   CMP14+EGFR   CBC   TSH   T4, Free   Bayer DCA Hb A1c Waived   EKG 12-Lead   No orders of the defined types were placed in this encounter.   Deri Fuelling, MS3

## 2022-11-29 LAB — CBC
Hematocrit: 46.9 % — ABNORMAL HIGH (ref 34.0–46.6)
Hemoglobin: 15.6 g/dL (ref 11.1–15.9)
MCH: 29.9 pg (ref 26.6–33.0)
MCHC: 33.3 g/dL (ref 31.5–35.7)
MCV: 90 fL (ref 79–97)
Platelets: 377 10*3/uL (ref 150–450)
RBC: 5.22 x10E6/uL (ref 3.77–5.28)
RDW: 12.1 % (ref 11.7–15.4)
WBC: 11.9 10*3/uL — ABNORMAL HIGH (ref 3.4–10.8)

## 2022-11-29 LAB — CMP14+EGFR
ALT: 16 IU/L (ref 0–32)
AST: 21 IU/L (ref 0–40)
Albumin/Globulin Ratio: 1.8 (ref 1.2–2.2)
Albumin: 4.1 g/dL (ref 3.9–4.9)
Alkaline Phosphatase: 67 IU/L (ref 44–121)
BUN/Creatinine Ratio: 11 — ABNORMAL LOW (ref 12–28)
BUN: 10 mg/dL (ref 8–27)
Bilirubin Total: 0.5 mg/dL (ref 0.0–1.2)
CO2: 25 mmol/L (ref 20–29)
Calcium: 9.6 mg/dL (ref 8.7–10.3)
Chloride: 101 mmol/L (ref 96–106)
Creatinine, Ser: 0.92 mg/dL (ref 0.57–1.00)
Globulin, Total: 2.3 g/dL (ref 1.5–4.5)
Glucose: 89 mg/dL (ref 70–99)
Potassium: 4 mmol/L (ref 3.5–5.2)
Sodium: 140 mmol/L (ref 134–144)
Total Protein: 6.4 g/dL (ref 6.0–8.5)
eGFR: 70 mL/min/{1.73_m2} (ref >59)

## 2022-11-29 LAB — TSH: TSH: 1.69 u[IU]/mL (ref 0.450–4.500)

## 2022-11-29 LAB — T4, FREE: Free T4: 1.28 ng/dL (ref 0.82–1.77)

## 2022-12-04 ENCOUNTER — Telehealth: Payer: Self-pay | Admitting: Family Medicine

## 2022-12-06 NOTE — Telephone Encounter (Signed)
I have emailed you patients portion

## 2022-12-11 DIAGNOSIS — R062 Wheezing: Secondary | ICD-10-CM | POA: Diagnosis not present

## 2022-12-11 DIAGNOSIS — J449 Chronic obstructive pulmonary disease, unspecified: Secondary | ICD-10-CM | POA: Diagnosis not present

## 2022-12-20 ENCOUNTER — Ambulatory Visit (INDEPENDENT_AMBULATORY_CARE_PROVIDER_SITE_OTHER): Payer: Medicare PPO

## 2022-12-20 VITALS — Ht 64.0 in | Wt 115.0 lb

## 2022-12-20 DIAGNOSIS — Z0000 Encounter for general adult medical examination without abnormal findings: Secondary | ICD-10-CM

## 2022-12-20 DIAGNOSIS — Z1231 Encounter for screening mammogram for malignant neoplasm of breast: Secondary | ICD-10-CM | POA: Diagnosis not present

## 2022-12-20 DIAGNOSIS — Z1211 Encounter for screening for malignant neoplasm of colon: Secondary | ICD-10-CM | POA: Diagnosis not present

## 2022-12-20 NOTE — Progress Notes (Signed)
Subjective:   Alicia Sanchez is a 62 y.o. female who presents for an Initial Medicare Annual Wellness Visit. I connected with  Alicia Sanchez on 12/20/22 by a audio enabled telemedicine application and verified that I am speaking with the correct person using two identifiers.  Patient Location: Home  Provider Location: Home Office  I discussed the limitations of evaluation and management by telemedicine. The patient expressed understanding and agreed to proceed.  Review of Systems     Cardiac Risk Factors include: advanced age (>77men, >37 women);hypertension;smoking/ tobacco exposure;dyslipidemia     Objective:    Today's Vitals   12/20/22 0849  Weight: 115 lb (52.2 kg)  Height: 5\' 4"  (1.626 m)   Body mass index is 19.74 kg/m.     12/20/2022    8:54 AM 10/25/2021    5:23 PM 08/08/2021    1:36 PM 02/09/2020    7:34 PM 05/22/2018   12:57 AM 05/21/2018    7:32 PM 11/19/2015    7:46 PM  Advanced Directives  Does Patient Have a Medical Advance Directive? No No No No  No No  Would patient like information on creating a medical advance directive? No - Patient declined Yes (MAU/Ambulatory/Procedural Areas - Information given)  No - Patient declined No - Patient declined  No - patient declined information    Current Medications (verified) Outpatient Encounter Medications as of 12/20/2022  Medication Sig   albuterol (VENTOLIN HFA) 108 (90 Base) MCG/ACT inhaler INHALE 2 PUFFS INTO THE LUNGS EVERY 6 (SIX) HOURS AS NEEDED FOR WHEEZING OR SHORTNESS OF BREATH.   alendronate (FOSAMAX) 70 MG tablet TAKE 1 TABLET EVERY 7 DAYS WITH A FULL GLASS OF WATER ON AN EMPTY STOMACH   ALPRAZolam (XANAX) 0.5 MG tablet TAKE 1 TABLET BY MOUTH THREE TIMES DAILY AND  1  EXTRA  PRN  FOR  INCREASED  ANXIETY   aspirin EC 81 MG tablet Take 81 mg by mouth daily.   atorvastatin (LIPITOR) 40 MG tablet TAKE 1 TABLET EVERY DAY (NEED MD APPOINTMENT FOR REFILLS)   azithromycin (ZITHROMAX) 250 MG tablet Take as directed    Budeson-Glycopyrrol-Formoterol (BREZTRI AEROSPHERE) 160-9-4.8 MCG/ACT AERO Inhale 2 puffs into the lungs 2 (two) times daily. Please fill #3 inahlers for 3 month supply   busPIRone (BUSPAR) 30 MG tablet TAKE 1 TABLET TWICE DAILY   calcium carbonate (OS-CAL) 1250 (500 Ca) MG chewable tablet Chew 1 tablet (1,250 mg total) by mouth daily.   cetirizine (ZYRTEC) 10 MG tablet Take 1 tablet by mouth once daily   DULoxetine (CYMBALTA) 60 MG capsule TAKE 2 CAPSULES (120 MG TOTAL) EVERY DAY   FOLIC ACID PO Take 1 tablet by mouth daily.   gabapentin (NEURONTIN) 300 MG capsule Take 1 capsule (300 mg total) by mouth at bedtime.   ipratropium-albuterol (DUONEB) 0.5-2.5 (3) MG/3ML SOLN Take 3 mLs by nebulization every 6 (six) hours as needed.   meclizine (ANTIVERT) 25 MG tablet TAKE 1 TABLET BY MOUTH THREE TIMES DAILY AS NEEDED FOR DIZZINESS   meloxicam (MOBIC) 7.5 MG tablet Take 1 tablet (7.5 mg total) by mouth daily.   Multiple Vitamin (MULTIVITAMIN WITH MINERALS) TABS tablet Take 1 tablet by mouth daily.   Multiple Vitamins-Minerals (VITAMIN D3 COMPLETE PO) Take 1 tablet by mouth daily.   nitroGLYCERIN (NITROSTAT) 0.4 MG SL tablet Place 1 tablet (0.4 mg total) under the tongue every 5 (five) minutes x 3 doses as needed for chest pain (if o relief after 2nd dose, proceed to the ED for  an evaluation or call 911).   predniSONE (DELTASONE) 20 MG tablet 2 po at same time daily for 5 days. START 11/29/2022   QUEtiapine (SEROQUEL) 200 MG tablet Take 1 tablet (200 mg total) by mouth at bedtime.   Respiratory Therapy Supplies (NEBULIZER/TUBING/MOUTHPIECE) KIT 1 Units by Does not apply route 4 (four) times daily. GIVE face mask and tubing for nebulizer. J44.1   rOPINIRole (REQUIP) 1 MG tablet Take 1-4 tablets (1-4 mg total) by mouth at bedtime.   vitamin B-12 (CYANOCOBALAMIN) 100 MCG tablet Take 100 mcg by mouth daily.   vitamin C (ASCORBIC ACID) 250 MG tablet Take 500-1,000 mg by mouth daily.   diltiazem (CARDIZEM)  30 MG tablet Take 1 tablet (30 mg total) by mouth 2 (two) times daily as needed.   No facility-administered encounter medications on file as of 12/20/2022.    Allergies (verified) Doxycycline   History: Past Medical History:  Diagnosis Date   Anxiety    COPD (chronic obstructive pulmonary disease) ([place])    Osteoporosis    Past Surgical History:  Procedure Laterality Date   ABDOMINAL HYSTERECTOMY     Family History  Problem Relation Age of Onset   COPD Mother    COPD Father    Diabetes type II Sister    Kidney failure Sister    Diabetes type II Brother    COPD Maternal Grandfather    Social History   Socioeconomic History   Marital status: Legally Separated    Spouse name: Not on file   Number of children: Not on file   Years of education: Not on file   Highest education level: Not on file  Occupational History   Not on file  Tobacco Use   Smoking status: Every Day    Packs/day: 0.50    Years: 42.00    Total pack years: 21.00    Types: Cigarettes   Smokeless tobacco: Never   Tobacco comments:    1/2 pack 09/24/22  Vaping Use   Vaping Use: Every day  Substance and Sexual Activity   Alcohol use: No   Drug use: No   Sexual activity: Yes    Birth control/protection: Surgical  Other Topics Concern   Not on file  Social History Narrative   Not on file   Social Determinants of Health   Financial Resource Strain: Low Risk  (12/20/2022)   Overall Financial Resource Strain (CARDIA)    Difficulty of Paying Living Expenses: Not hard at all  Food Insecurity: No Food Insecurity (12/20/2022)   Hunger Vital Sign    Worried About Running Out of Food in the Last Year: Never true    [place] in the Last Year: Never true  Transportation Needs: No Transportation Needs (12/20/2022)   PRAPARE - Hydrologist (Medical): No    Lack of Transportation (Non-Medical): No  Physical Activity: Inactive (12/20/2022)   Exercise Vital Sign    Days of  Exercise per Week: 0 days    Minutes of Exercise per Session: 0 min  Stress: No Stress Concern Present (12/20/2022)   [place]    Feeling of Stress : Not at all  Social Connections: Moderately Isolated (12/20/2022)   Social Connection and Isolation Panel [NHANES]    Frequency of Communication with Friends and Family: More than three times a week    Frequency of Social Gatherings with Friends and Family: More than three times a week  Attends Religious Services: More than 4 times per year    Active Member of [place] or Organizations: No    Attends Archivist Meetings: Never    Marital Status: Separated    Tobacco Counseling Ready to quit: No Counseling given: Not Answered Tobacco comments: 1/2 pack 09/24/22   Clinical Intake:  Pre-visit preparation completed: Yes  Pain : No/denies pain     Nutritional Risks: None Diabetes: No  How often do you need to have someone help you when you read instructions, pamphlets, or other written materials from your doctor or pharmacy?: 1 - Never  Diabetic?no   Interpreter Needed?: No  Information entered by :: Jadene Pierini, LPN   Activities of Daily Living    12/20/2022    8:54 AM  In your present state of health, do you have any difficulty performing the following activities:  Hearing? 0  Vision? 0  Difficulty concentrating or making decisions? 0  Walking or climbing stairs? 0  Dressing or bathing? 0  Doing errands, shopping? 0  Preparing Food and eating ? N  Using the Toilet? N  In the past six months, have you accidently leaked urine? N  Do you have problems with loss of bowel control? N  Managing your Medications? N  Managing your Finances? N  Housekeeping or managing your Housekeeping? N    Patient Care Team: Janora Norlander, DO as PCP - General (Family Medicine) Harl Bowie Alphonse Guild, MD as PCP - Cardiology (Cardiology) Marshell Garfinkel, MD as  Consulting Physician (Pulmonary Disease) Lavera Guise, [facility] (Pharmacist) Mozingo, Berdie Ogren, NP as Nurse Practitioner (Psychiatry)  Indicate any recent Medical Services you may have received from other than [facility] providers in the past year (date may be approximate).     Assessment:   This is a routine wellness examination for [place].  Hearing/Vision screen Vision Screening - Comments:: Wears rx glasses - up to date with routine eye exams with  Dr.Johnson   Dietary issues and exercise activities discussed: Current Exercise Habits: The patient does not participate in regular exercise at present   Goals Addressed             This Visit's Progress    DIET - INCREASE WATER INTAKE   On track      Depression Screen    12/20/2022    8:53 AM 11/21/2021    8:11 AM 10/25/2021    3:16 PM 07/31/2021    3:45 PM 05/19/2021    3:04 PM 02/24/2021    2:24 PM 10/25/2020    1:35 PM  PHQ 2/9 Scores  PHQ - 2 Score 0 3 4 4 1 6 4   PHQ- 9 Score  13 16 17 5 19 12     Fall Risk    12/20/2022    8:50 AM 11/21/2021    8:10 AM 10/25/2021    3:16 PM 07/31/2021    3:45 PM 05/19/2021    3:01 PM  Fall Risk   Falls in the past year? 0 1 1 1  0  Number falls in past yr: 0 0 0 1   Injury with Fall? 0 0 0 0   Risk for fall due to : No Fall Risks History of fall(s) History of fall(s) History of fall(s)   Follow up Falls prevention discussed Education provided Education provided Education provided     FALL RISK PREVENTION PERTAINING TO THE HOME:  Any stairs in or around the home? No  If so, are there any without handrails?  No  Home free of loose throw rugs in walkways, pet beds, electrical cords, etc? Yes  Adequate lighting in your home to reduce risk of falls? Yes   ASSISTIVE DEVICES UTILIZED TO PREVENT FALLS:  Life alert? No  Use of a cane, walker or w/c? No  Grab bars in the bathroom? No  Shower chair or bench in shower? No  Elevated toilet seat or a handicapped toilet? No   Cognitive  Function:    10/25/2021    3:33 PM  MMSE - Mini Mental State Exam  Orientation to time 5  Orientation to Place 5  Registration 3  Attention/ Calculation 5  Recall 3  Language- name 2 objects 2  Language- repeat 1  Language- follow 3 step command 3  Language- read & follow direction 1  Write a sentence 1  Copy design 1  Total score 30        12/20/2022    8:54 AM  6CIT Screen  What Year? 0 points  What month? 0 points  What time? 0 points  Count back from 20 0 points  Months in reverse 0 points  Repeat phrase 0 points  Total Score 0 points    Immunizations Immunization History  Administered Date(s) Administered   Influenza Split 08/25/2016   Influenza,inj,Quad PF,6+ Mos 09/25/2017, 07/16/2018, 10/25/2020, 08/30/2021, 09/24/2022   Influenza-Unspecified 07/16/2018   Moderna Sars-Covid-2 Vaccination 01/19/2020, 02/16/2020   Pneumococcal Polysaccharide-23 05/23/2018   Respiratory Syncytial Virus Vaccine,Recomb Aduvanted(Arexvy) 09/24/2022   Tdap 05/19/2007, 07/08/2014    TDAP status: Up to date  Flu Vaccine status: Up to date  Pneumococcal vaccine status: Up to date  Covid-19 vaccine status: Completed vaccines  Qualifies for Shingles Vaccine? Yes   Zostavax completed No   Shingrix Completed?: No.    Education has been provided regarding the importance of this vaccine. Patient has been advised to call insurance company to determine out of pocket expense if they have not yet received this vaccine. Advised may also receive vaccine at local pharmacy or Health Dept. Verbalized acceptance and understanding.  Screening Tests Health Maintenance  Topic Date Due   Zoster Vaccines- Shingrix (1 of 2) Never done   COVID-19 Vaccine (3 - Moderna risk series) 03/15/2020   Fecal DNA (Cologuard)  01/25/2022   MAMMOGRAM  07/31/2022   PAP SMEAR-Modifier  01/05/2023   Lung Cancer Screening  11/29/2023   Medicare Annual Wellness (AWV)  12/21/2023   DTaP/Tdap/Td (3 - Td or Tdap)  07/08/2024   INFLUENZA VACCINE  Completed   Hepatitis C Screening  Completed   HIV Screening  Completed   HPV VACCINES  Aged Out    Health Maintenance  Health Maintenance Due  Topic Date Due   Zoster Vaccines- Shingrix (1 of 2) Never done   COVID-19 Vaccine (3 - Moderna risk series) 03/15/2020   Fecal DNA (Cologuard)  01/25/2022   MAMMOGRAM  07/31/2022   PAP SMEAR-Modifier  01/05/2023    Colorectal cancer screening: Referral to GI placed 12/20/2022. Pt aware the office will call re: appt.  Mammogram status: Ordered 12/20/2022. Pt provided with contact info and advised to call to schedule appt.   Bone Density status: Ordered not of age . Pt provided with contact info and advised to call to schedule appt.  Lung Cancer Screening: (Low Dose CT Chest recommended if Age 50-80 years, 30 pack-year currently smoking OR have quit w/in 15years.) does not qualify.   Lung Cancer Screening Referral: 11/28/2022  Additional Screening:  Hepatitis C Screening: does  not qualify; Completed 11/24/2018  Vision Screening: Recommended annual ophthalmology exams for early detection of glaucoma and other disorders of the eye. Is the patient up to date with their annual eye exam?  Yes  Who is the provider or what is the name of the office in which the patient attends annual eye exams? Dr.Johnson  If pt is not established with a provider, would they like to be referred to a provider to establish care? No .   Dental Screening: Recommended annual dental exams for proper oral hygiene  Community Resource Referral / Chronic Care Management: CRR required this visit?  No   CCM required this visit?  No      Plan:     I have personally reviewed and noted the following in the patient's chart:   Medical and social history Use of alcohol, tobacco or illicit drugs  Current medications and supplements including opioid prescriptions. Patient is not currently taking opioid prescriptions. Functional ability  and status Nutritional status Physical activity Advanced directives List of other physicians Hospitalizations, surgeries, and ER visits in previous 12 months Vitals Screenings to include cognitive, depression, and falls Referrals and appointments  In addition, I have reviewed and discussed with patient certain preventive protocols, quality metrics, and best practice recommendations. A written personalized care plan for preventive services as well as general preventive health recommendations were provided to patient.     Daphane Shepherd, LPN   [id number]   Nurse Notes: none

## 2022-12-20 NOTE — Patient Instructions (Signed)
Alicia Sanchez , Thank you for taking time to come for your Medicare Wellness Visit. I appreciate your ongoing commitment to your health goals. Please review the following plan we discussed and let me know if I can assist you in the future.   These are the goals we discussed:  Goals       COPD (pt-stated)      Current Barriers:  Unable to independently afford treatment regimen Unable to achieve control of COPD   Pharmacist Clinical Goal(s):  Over the next 90 days, patient will verbalize ability to afford treatment regimen achieve control of COPD as evidenced by [place] through collaboration with PharmD and provider.    Interventions: 1:1 collaboration with Janora Norlander, DO regarding development and update of comprehensive plan of care as evidenced by provider attestation and co-signature Inter-disciplinary care team collaboration (see longitudinal plan of care) Comprehensive medication review performed; medication list updated in electronic medical record  Chronic Obstructive Pulmonary Disease: Uncontrolled; current treatment: Breztri Continues on Breztri, nebs as needed, RESCUE inhaler 1-2 TIMES DAILY Reviewed Judithann Sauger dosing--> 2 puffs twice daily We have switched to breztri due to ease of patient assistance RE-ENROLLMENT SUBMITTED FOR 2023 AZ&ME PATIENT ASSISTANCE--refill sent today She quit smoking in 2021 but unfortunately started again due to family stresses GOLD Classification: II Smoking history: 35-pack-year smoking history.  Active smoker Discussed smoking cessation.  She continues to try decreasing the amount of cigarettes She is not taking Chantix, she is not wearing patches She remains willing to try to quit; NOT READY Continue to counsel Most recent Pulmonary Function Testing:  Pulmonary Functions Testing Results: 2019  TLC  Date Value Ref Range Status  08/18/2018 5.98 L Final   Educated on proper use of  Breztri since recent transition from Trelegy to [place] patient finances. Patient to qualify for the AZ&me patient assistance; RE-ENROLLMENT SUBMITTED FOR 2023 AZ&ME PATIENT ASSISTANCE, will ship to patient's home VIA [place]   Patient Goals/Self-Care Activities Over the next 90 days, patient will:  - take medications as prescribed Work to decrease the amount of cigarretes per day; decrease amount of rescue inhaler as able  Follow Up Plan: Telephone follow up appointment with care management team member scheduled for: 3 months       DIET - INCREASE WATER INTAKE      Quit Smoking        This is a list of the screening recommended for you and due dates:  Health Maintenance  Topic Date Due   Zoster (Shingles) Vaccine (1 of 2) Never done   COVID-19 Vaccine (3 - Moderna risk series) 03/15/2020   Cologuard (Stool DNA test)  01/25/2022   Mammogram  07/31/2022   Pap Smear  01/05/2023   Screening for Lung Cancer  11/29/2023   Medicare Annual Wellness Visit  12/21/2023   DTaP/Tdap/Td vaccine (3 - Td or Tdap) 07/08/2024   Flu Shot  Completed   Hepatitis C Screening: USPSTF Recommendation to screen - Ages 18-79 yo.  Completed   HIV Screening  Completed   HPV Vaccine  Aged Out    Advanced directives: Advance directive discussed with you today. I have provided a copy for you to complete at home and have notarized. Once this is complete please bring a copy in to our office so we can scan it into your chart.   Conditions/risks identified: Aim for 30 minutes of exercise  or brisk walking, 6-8 glasses of water, and 5 servings of fruits and vegetables each day.   Next appointment: Follow up in one year for your annual wellness visit.   Preventive Care 35 Years and Older, Female  Preventive care refers to lifestyle choices and visits with your health care provider that can promote health and wellness. What does preventive care  include? A yearly physical exam. This is also called an annual well check. Dental exams once or twice a year. Routine eye exams. Ask your health care provider how often you should have your eyes checked. Personal lifestyle choices, including: Daily care of your teeth and gums. Regular physical activity. Eating a healthy diet. Avoiding tobacco and drug use. Limiting alcohol use. Practicing safe sex. Taking low doses of aspirin every day. Taking vitamin and mineral supplements as recommended by your health care provider. What happens during an annual well check? The services and screenings done by your health care provider during your annual well check will depend on your age, overall health, lifestyle risk factors, and family history of disease. Counseling  Your health care provider may ask you questions about your: Alcohol use. Tobacco use. Drug use. Emotional well-being. Home and relationship well-being. Sexual activity. Eating habits. History of falls. Memory and ability to understand (cognition). Work and work Statistician. Screening  You may have the following tests or measurements: Height, weight, and BMI. Blood pressure. Lipid and cholesterol levels. These may be checked every 5 years, or more frequently if you are over 60 years old. Skin check. Lung cancer screening. You may have this screening every year starting at age 66 if you have a 30-pack-year history of smoking and currently smoke or have quit within the past 15 years. Fecal occult blood test (FOBT) of the stool. You may have this test every year starting at age 31. Flexible sigmoidoscopy or colonoscopy. You may have a sigmoidoscopy every 5 years or a colonoscopy every 10 years starting at age 77. Prostate cancer screening. Recommendations will vary depending on your family history and other risks. Hepatitis C blood test. Hepatitis B blood test. Sexually transmitted disease (STD) testing. Diabetes screening. This  is done by checking your blood sugar (glucose) after you have not eaten for a while (fasting). You may have this done every 1-3 years. Abdominal aortic aneurysm (AAA) screening. You may need this if you are a current or former smoker. Osteoporosis. You may be screened starting at age 54 if you are at high risk. Talk with your health care provider about your test results, treatment options, and if necessary, the need for more tests. Vaccines  Your health care provider may recommend certain vaccines, such as: Influenza vaccine. This is recommended every year. Tetanus, diphtheria, and acellular pertussis (Tdap, Td) vaccine. You may need a Td booster every 10 years. Zoster vaccine. You may need this after age 69. Pneumococcal 13-valent conjugate (PCV13) vaccine. One dose is recommended after age 72. Pneumococcal polysaccharide (PPSV23) vaccine. One dose is recommended after age 15. Talk to your health care provider about which screenings and vaccines you need and how often you need them. This information is not intended to replace advice given to you by your health care provider. Make sure you discuss any questions you have with your health care provider. Document Released: 12/02/2015 Document Revised: 07/25/2016 Document Reviewed: 09/06/2015 Elsevier Interactive Patient Education  2017 [employer] Prevention in the Home Falls can cause injuries. They can happen to people of all ages. There are  many things you can do to make your home safe and to help prevent falls. What can I do on the outside of my home? Regularly fix the edges of walkways and driveways and fix any cracks. Remove anything that might make you trip as you walk through a door, such as a raised step or threshold. Trim any bushes or trees on the path to your home. Use bright outdoor lighting. Clear any walking paths of anything that might make someone trip, such as rocks or tools. Regularly check to see if handrails are  loose or broken. Make sure that both sides of any steps have handrails. Any raised decks and porches should have guardrails on the edges. Have any leaves, snow, or ice cleared regularly. Use sand or salt on walking paths during winter. Clean up any spills in your garage right away. This includes oil or grease spills. What can I do in the bathroom? Use night lights. Install grab bars by the toilet and in the tub and shower. Do not use towel bars as grab bars. Use non-skid mats or decals in the tub or shower. If you need to sit down in the shower, use a plastic, non-slip stool. Keep the floor dry. Clean up any water that spills on the floor as soon as it happens. Remove soap buildup in the tub or shower regularly. Attach bath mats securely with double-sided non-slip rug tape. Do not have throw rugs and other things on the floor that can make you trip. What can I do in the bedroom? Use night lights. Make sure that you have a light by your bed that is easy to reach. Do not use any sheets or blankets that are too big for your bed. They should not hang down onto the floor. Have a firm chair that has side arms. You can use this for support while you get dressed. Do not have throw rugs and other things on the floor that can make you trip. What can I do in the kitchen? Clean up any spills right away. Avoid walking on wet floors. Keep items that you use a lot in easy-to-reach places. If you need to reach something above you, use a strong step stool that has a grab bar. Keep electrical cords out of the way. Do not use floor polish or wax that makes floors slippery. If you must use wax, use non-skid floor wax. Do not have throw rugs and other things on the floor that can make you trip. What can I do with my stairs? Do not leave any items on the stairs. Make sure that there are handrails on both sides of the stairs and use them. Fix handrails that are broken or loose. Make sure that handrails are as  long as the stairways. Check any carpeting to make sure that it is firmly attached to the stairs. Fix any carpet that is loose or worn. Avoid having throw rugs at the top or bottom of the stairs. If you do have throw rugs, attach them to the floor with carpet tape. Make sure that you have a light switch at the top of the stairs and the bottom of the stairs. If you do not have them, ask someone to add them for you. What else can I do to help prevent falls? Wear shoes that: Do not have high heels. Have rubber bottoms. Are comfortable and fit you well. Are closed at the toe. Do not wear sandals. If you use a stepladder: Make sure that  it is fully opened. Do not climb a closed stepladder. Make sure that both sides of the stepladder are locked into place. Ask someone to hold it for you, if possible. Clearly mark and make sure that you can see: Any grab bars or handrails. First and last steps. Where the edge of each step is. Use tools that help you move around (mobility aids) if they are needed. These include: Canes. Walkers. Scooters. Crutches. Turn on the lights when you go into a dark area. Replace any light bulbs as soon as they burn out. Set up your furniture so you have a clear path. Avoid moving your furniture around. If any of your floors are uneven, fix them. If there are any pets around you, be aware of where they are. Review your medicines with your doctor. Some medicines can make you feel dizzy. This can increase your chance of falling. Ask your doctor what other things that you can do to help prevent falls. This information is not intended to replace advice given to you by your health care provider. Make sure you discuss any questions you have with your health care provider. Document Released: 09/01/2009 Document Revised: 04/12/2016 Document Reviewed: 12/10/2014 Elsevier Interactive Patient Education  2017 [employer].

## 2022-12-23 ENCOUNTER — Other Ambulatory Visit: Payer: Self-pay | Admitting: Pulmonary Disease

## 2022-12-23 DIAGNOSIS — F331 Major depressive disorder, recurrent, moderate: Secondary | ICD-10-CM

## 2022-12-24 ENCOUNTER — Ambulatory Visit
Admission: RE | Admit: 2022-12-24 | Discharge: 2022-12-24 | Disposition: A | Discharge status: Home / Self Care | Payer: Medicare PPO | Source: Ambulatory Visit | Attending: Family Medicine | Admitting: Family Medicine

## 2022-12-24 ENCOUNTER — Other Ambulatory Visit: Payer: Self-pay

## 2022-12-24 ENCOUNTER — Telehealth: Payer: Self-pay | Admitting: Adult Health

## 2022-12-24 DIAGNOSIS — F411 Generalized anxiety disorder: Secondary | ICD-10-CM

## 2022-12-24 DIAGNOSIS — Z1231 Encounter for screening mammogram for malignant neoplasm of breast: Secondary | ICD-10-CM | POA: Diagnosis not present

## 2022-12-24 DIAGNOSIS — F410 Panic disorder [episodic paroxysmal anxiety] without agoraphobia: Secondary | ICD-10-CM

## 2022-12-24 DIAGNOSIS — G4700 Insomnia, unspecified: Secondary | ICD-10-CM

## 2022-12-24 MED ORDER — QUETIAPINE FUMARATE 200 MG PO TABS: 200.0000 mg | ORAL_TABLET | Freq: Every day | ORAL | 0 refills | Status: DC

## 2022-12-24 NOTE — Telephone Encounter (Signed)
[facility] requesting 90 day Rx for Quetiapine.  Phone [phone] fax [phone]

## 2022-12-24 NOTE — Telephone Encounter (Signed)
Rx sent 

## 2022-12-28 ENCOUNTER — Ambulatory Visit: Payer: Medicare PPO | Admitting: Pulmonary Disease

## 2023-01-02 ENCOUNTER — Other Ambulatory Visit: Payer: Self-pay | Admitting: Family Medicine

## 2023-01-02 DIAGNOSIS — F331 Major depressive disorder, recurrent, moderate: Secondary | ICD-10-CM

## 2023-01-08 DIAGNOSIS — S022XXA Fracture of nasal bones, initial encounter for closed fracture: Secondary | ICD-10-CM | POA: Diagnosis not present

## 2023-01-08 DIAGNOSIS — F17210 Nicotine dependence, cigarettes, uncomplicated: Secondary | ICD-10-CM | POA: Diagnosis not present

## 2023-01-08 DIAGNOSIS — J449 Chronic obstructive pulmonary disease, unspecified: Secondary | ICD-10-CM | POA: Diagnosis not present

## 2023-01-08 DIAGNOSIS — R42 Dizziness and giddiness: Secondary | ICD-10-CM | POA: Diagnosis not present

## 2023-01-08 DIAGNOSIS — S022XXB Fracture of nasal bones, initial encounter for open fracture: Secondary | ICD-10-CM | POA: Diagnosis not present

## 2023-01-08 DIAGNOSIS — R55 Syncope and collapse: Secondary | ICD-10-CM | POA: Diagnosis not present

## 2023-01-08 DIAGNOSIS — S0990XA Unspecified injury of head, initial encounter: Secondary | ICD-10-CM | POA: Diagnosis not present

## 2023-01-08 DIAGNOSIS — J189 Pneumonia, unspecified organism: Secondary | ICD-10-CM | POA: Diagnosis not present

## 2023-01-08 DIAGNOSIS — S01512A Laceration without foreign body of oral cavity, initial encounter: Secondary | ICD-10-CM | POA: Diagnosis not present

## 2023-01-09 ENCOUNTER — Encounter: Payer: Self-pay | Admitting: Pulmonary Disease

## 2023-01-09 ENCOUNTER — Ambulatory Visit (INDEPENDENT_AMBULATORY_CARE_PROVIDER_SITE_OTHER): Payer: Medicare PPO | Admitting: Pulmonary Disease

## 2023-01-09 ENCOUNTER — Ambulatory Visit: Payer: Medicare PPO

## 2023-01-09 VITALS — BP 110/62 | HR 107 | Temp 98.1°F | Ht 64.0 in | Wt 113.8 lb

## 2023-01-09 DIAGNOSIS — J441 Chronic obstructive pulmonary disease with (acute) exacerbation: Secondary | ICD-10-CM | POA: Diagnosis not present

## 2023-01-09 DIAGNOSIS — R42 Dizziness and giddiness: Secondary | ICD-10-CM | POA: Diagnosis not present

## 2023-01-09 DIAGNOSIS — R55 Syncope and collapse: Secondary | ICD-10-CM | POA: Diagnosis not present

## 2023-01-09 MED ORDER — BREZTRI AEROSPHERE 160-9-4.8 MCG/ACT IN AERO: 2.0000 | INHALATION_SPRAY | Freq: Two times a day (BID) | RESPIRATORY_TRACT | 0 refills | Status: DC

## 2023-01-09 NOTE — Patient Instructions (Signed)
Continue your inhaler with respiratory Please pick up the Levaquin and finish the 7-day course Will order a follow-up chest x-ray in 4 weeks Return to clinic in 6 months.

## 2023-01-09 NOTE — Addendum Note (Signed)
Addended by: Elton Sin on: 01/09/2023 02:13 PM   Modules accepted: Orders

## 2023-01-09 NOTE — Progress Notes (Signed)
Alicia Sanchez    [id number]    1961-04-15  Primary Care Physician:Gottschalk, Koleen Distance, DO  Referring Physician: Janora Norlander, DO [place],  [place] [id number]  Chief complaint:   Follow-up for COPD  HPI: Alicia Sanchez is a 62 y.o.  active smoker with history of anxiety, depression, COPD.  Initially treated with Memory Dance and Incruse which was consolidated to Trelegy inhaler in 2018  She has daily symptoms of snoring. She does sleep study 4 years ago but does not know the result of this test. She is a diagnosis of restless leg syndrome and is on requip. She did not have the repeat sleep study done as it was not covered by insurance.  Pets:Dog which lives outside the house. No pets, exotic pets. Occupation:Customer sevice rep. previously worked in [employer] Exposures: His current dust exposure and previous exposure to cotton fiber in her line of work Smoking history: 35-pack-year smoking history. Continues to smoke 1 pack per day She quit smoking in 2021 but unfortunately started again due to family stresses  Interim history: Continues on breztri inhaler. Continues to smoke due to personal stressors at home. She is separated from her husband and has lost some weight.  She had a fall last night when she got out of the bed to go to the refrigerator and passed out.  Evaluated in [facility] ED at [facility] with findings of facial fracture, black eye and chest x-ray showing lower lobe infiltrate concerning for aspiration pneumonia.  She has been given a course of Levaquin but has not started it yet  Her fall was not preceded by any respiratory symptoms and she feels that her blood pressure medications are to blame  Outpatient Encounter Medications as of 01/09/2023  Medication Sig   albuterol (VENTOLIN HFA) 108 (90 Base) MCG/ACT inhaler INHALE 2 PUFFS INTO THE LUNGS EVERY 6 (SIX) HOURS AS NEEDED FOR WHEEZING OR SHORTNESS OF BREATH.   alendronate (FOSAMAX) 70 MG tablet TAKE 1  TABLET EVERY 7 DAYS WITH A FULL GLASS OF WATER ON AN EMPTY STOMACH   ALPRAZolam (XANAX) 0.5 MG tablet TAKE 1 TABLET BY MOUTH THREE TIMES DAILY AND  1  EXTRA  PRN  FOR  INCREASED  ANXIETY   aspirin EC 81 MG tablet Take 81 mg by mouth daily.   atorvastatin (LIPITOR) 40 MG tablet TAKE 1 TABLET EVERY DAY (NEED MD APPOINTMENT FOR REFILLS)   Budeson-Glycopyrrol-Formoterol (BREZTRI AEROSPHERE) 160-9-4.8 MCG/ACT AERO Inhale 2 puffs into the lungs 2 (two) times daily. Please fill #3 inahlers for 3 month supply   busPIRone (BUSPAR) 30 MG tablet TAKE 1 TABLET TWICE DAILY   calcium carbonate (OS-CAL) 1250 (500 Ca) MG chewable tablet Chew 1 tablet (1,250 mg total) by mouth daily.   cetirizine (ZYRTEC) 10 MG tablet Take 1 tablet by mouth once daily   DULoxetine (CYMBALTA) 60 MG capsule TAKE 2 CAPSULES (120 MG TOTAL) EVERY DAY   FOLIC ACID PO Take 1 tablet by mouth daily.   gabapentin (NEURONTIN) 300 MG capsule Take 1 capsule (300 mg total) by mouth at bedtime.   ipratropium-albuterol (DUONEB) 0.5-2.5 (3) MG/3ML SOLN Take 3 mLs by nebulization every 6 (six) hours as needed.   levofloxacin (LEVAQUIN) 750 MG tablet Take 750 mg by mouth daily.   meclizine (ANTIVERT) 25 MG tablet TAKE 1 TABLET BY MOUTH THREE TIMES DAILY AS NEEDED FOR DIZZINESS   meloxicam (MOBIC) 7.5 MG tablet Take 1 tablet (7.5 mg total) by  mouth daily.   Multiple Vitamin (MULTIVITAMIN WITH MINERALS) TABS tablet Take 1 tablet by mouth daily.   Multiple Vitamins-Minerals (VITAMIN D3 COMPLETE PO) Take 1 tablet by mouth daily.   nitroGLYCERIN (NITROSTAT) 0.4 MG SL tablet Place 1 tablet (0.4 mg total) under the tongue every 5 (five) minutes x 3 doses as needed for chest pain (if o relief after 2nd dose, proceed to the ED for an evaluation or call 911).   QUEtiapine (SEROQUEL) 200 MG tablet Take 1 tablet (200 mg total) by mouth at bedtime.   Respiratory Therapy Supplies (NEBULIZER/TUBING/MOUTHPIECE) KIT 1 Units by Does not apply route 4 (four) times  daily. GIVE face mask and tubing for nebulizer. J44.1   rOPINIRole (REQUIP) 1 MG tablet Take 1-4 tablets (1-4 mg total) by mouth at bedtime.   vitamin B-12 (CYANOCOBALAMIN) 100 MCG tablet Take 100 mcg by mouth daily.   vitamin C (ASCORBIC ACID) 250 MG tablet Take 500-1,000 mg by mouth daily.   [DISCONTINUED] azithromycin (ZITHROMAX) 250 MG tablet Take as directed   [DISCONTINUED] diltiazem (CARDIZEM) 30 MG tablet Take 1 tablet (30 mg total) by mouth 2 (two) times daily as needed.   [DISCONTINUED] predniSONE (DELTASONE) 20 MG tablet 2 po at same time daily for 5 days. START 11/29/2022   No facility-administered encounter medications on file as of 01/09/2023.   Physical Exam: Blood pressure 114/60, pulse 96, temperature 97.8 F (36.6 C), temperature source Oral, height 5' 4"$  (1.626 m), weight 126 lb 6.4 oz (57.3 kg), SpO2 93 %. Gen:      No acute distress HEENT:  EOMI, sclera anicteric Neck:     No masses; no thyromegaly Lungs:    Clear to auscultation bilaterally; normal respiratory effort CV:         Regular rate and rhythm; no murmurs Abd:      + bowel sounds; soft, non-tender; no palpable masses, no distension Ext:    No edema; adequate peripheral perfusion Skin:      Warm and dry; no rash Neuro: alert and oriented x 3 Psych: normal mood and affect   Data Reviewed: Imaging Screening CT chest 05/08/17-centrilobular emphysema, calcified granuloma, tiny subcentimeter pulmonary nodules. CTA 05/21/2018- emphysema, scattered pulmonary nodule.  4 mm right lower lung nodule is new. I have reviewed the images personally  CTA [facility]) 03/11/2019-no pulmonary embolism.  No active cardiopulmonary disease.  Coronary artery calcification No images available in PACS  Screening CT 12/17/2019-emphysema, tiny calcified pulmonary nodules  CTA 02/09/2020-no PE, lungs are clear with emphysema.  Screening CT 04/03/2021-emphysema, stable pulmonary nodules.  Screening CT chest 04/03/2022-moderate  emphysema, stable lung nodules.  Chest x-ray report [facility] 01/09/2022-right lower lobe airspace opacity I have reviewed the images personally.  PFTs  07/24/17 FVC 2.54 (74%], FEV1 1.55 (58%], F/F 61, TLC 103%, DLCO 64% Moderate obstruction and diffusion impairment with air trapping  08/18/2018 FVC 2.26 [70%], FEV1 1.23 [49%], F/F 54, TLC 5.98 [123%], DLCO 12.55 [56%] Severe obstructive airway disease.  Worse compared to 2018  Labs CBC/1/17-absolute eosinophil count 0 Alpha-1 antitrypsin 04/25/2017-136, PI MM  Assessment:  Recent fall, right lower lobe infiltrate May have aspirated after the fall.  Her fall was not preceded by respiratory symptoms and she will follow-up with primary care to adjust BP medication I have advised her to fill the Levaquin prescription that was already given by ED Follow-up chest x-ray in 4 weeks  COPD, chronic bronchitis Currently on breztri, albuterol  Active smoker Continues to smoke We discussed smoking cessation and  she wants to attempt again to quit.  She is also vaping and attempt to come off cigarettes. Time spent counseling-5 minutes.  Reassess at return visit.  Continue low-dose screening CT   Restless leg syndrome, suspected sleep apnea On requip for restless leg syndrome.  Sleep study not done as it is not covered by insurance.  Plan/Recommendations: - Continue breztri duo nebs as needed - Levaquin for 7 days - Follow-up chest x-ray in 4 weeks - Smoking cessation  Marshell Garfinkel MD [facility] 01/09/2023, 1:49 PM  CC: Janora Norlander, DO     Marshell Garfinkel, MD

## 2023-01-10 ENCOUNTER — Telehealth: Payer: Self-pay | Admitting: Family Medicine

## 2023-01-10 NOTE — Telephone Encounter (Signed)
Patient had a fall two nights ago and was seen in the hospital. She fractured her nose and got a concussion. Wanting to come in to see PCP to get a referral for ENT, PCP is booked and she does not want to wait until her appt on 2/28. Please call back and advise.

## 2023-01-10 NOTE — Telephone Encounter (Signed)
Pt scheduled tomorrow with DOD Lenna Gilford) at 3:05 for ED follow up and to get referral for ENT.

## 2023-01-11 ENCOUNTER — Ambulatory Visit (INDEPENDENT_AMBULATORY_CARE_PROVIDER_SITE_OTHER): Payer: Medicare PPO | Admitting: Family

## 2023-01-11 ENCOUNTER — Encounter: Payer: Self-pay | Admitting: Family

## 2023-01-11 VITALS — BP 120/77 | HR 104 | Temp 97.6°F | Ht 64.0 in | Wt 115.6 lb

## 2023-01-11 DIAGNOSIS — J189 Pneumonia, unspecified organism: Secondary | ICD-10-CM

## 2023-01-11 DIAGNOSIS — J439 Emphysema, unspecified: Secondary | ICD-10-CM | POA: Diagnosis not present

## 2023-01-11 DIAGNOSIS — S022XXD Fracture of nasal bones, subsequent encounter for fracture with routine healing: Secondary | ICD-10-CM

## 2023-01-11 DIAGNOSIS — Z720 Tobacco use: Secondary | ICD-10-CM

## 2023-01-11 DIAGNOSIS — R062 Wheezing: Secondary | ICD-10-CM | POA: Diagnosis not present

## 2023-01-11 DIAGNOSIS — Z09 Encounter for follow-up examination after completed treatment for conditions other than malignant neoplasm: Secondary | ICD-10-CM

## 2023-01-11 DIAGNOSIS — R55 Syncope and collapse: Secondary | ICD-10-CM | POA: Diagnosis not present

## 2023-01-11 DIAGNOSIS — J449 Chronic obstructive pulmonary disease, unspecified: Secondary | ICD-10-CM | POA: Diagnosis not present

## 2023-01-11 MED ORDER — ONDANSETRON HCL 4 MG PO TABS: 4.0000 mg | ORAL_TABLET | Freq: Three times a day (TID) | ORAL | 0 refills | Status: DC | PRN

## 2023-01-11 NOTE — Progress Notes (Signed)
Subjective:    Patient ID: Alicia Sanchez, female    DOB: Apr 16, 1961, 62 y.o.   MRN: [id number]  Chief Complaint  Patient presents with   Hospitalization Follow-up    FALL WENT TO [facility]. PASSING OUT    PT presents to the office today for hospital follow up. She reports she had a syncope episode and hit her nose. She went to the ED and diagnosed with concussion, nose fracture,  pneumonia of RLL.    She was discharged on Levofloxacin 750 mg daily for 7 days.  She saw Pulmonologist 01/09/23 told to complete levofloxacin and has repeat chest x-ray pending in 3 weeks.  She has COPD and continues to smoke 1/2 pack a day. Admits to SOB and productive cough.   She had a negative CT scan of head. EKG was negative for acute infarct.  Loss of Consciousness This is a new problem. The current episode started 1 to 4 weeks ago. The symptoms are aggravated by standing. Associated symptoms include dizziness and light-headedness. She has tried bed rest for the symptoms. The treatment provided mild relief.      Review of Systems  Cardiovascular:  Positive for syncope.  Neurological:  Positive for dizziness and light-headedness.  All other systems reviewed and are negative.      Objective:   Physical Exam Vitals reviewed.  Constitutional:      General: She is not in acute distress.    Appearance: She is well-developed.  HENT:     Head: Normocephalic and atraumatic.     Right Ear: Tympanic membrane normal.     Left Ear: Tympanic membrane normal.     Nose:     Comments: Nasal swelling Eyes:     Pupils: Pupils are equal, round, and reactive to light.  Neck:     Thyroid: No thyromegaly.  Cardiovascular:     Rate and Rhythm: Normal rate and regular rhythm.     Heart sounds: Normal heart sounds. No murmur heard. Pulmonary:     Effort: Pulmonary effort is normal. No respiratory distress.     Breath sounds: Normal breath sounds. No wheezing.  Abdominal:     General: Bowel sounds are  normal. There is no distension.     Palpations: Abdomen is soft.     Tenderness: There is no abdominal tenderness.  Musculoskeletal:        General: No tenderness. Normal range of motion.     Cervical back: Normal range of motion and neck supple.  Skin:    General: Skin is warm and dry.  Neurological:     Mental Status: She is alert and oriented to person, place, and time.     Cranial Nerves: No cranial nerve deficit.     Deep Tendon Reflexes: Reflexes are normal and symmetric.  Psychiatric:        Behavior: Behavior normal.        Thought Content: Thought content normal.        Judgment: Judgment normal.     BP 120/77   Pulse (!) 104   Temp 97.6 F (36.4 C) (Temporal)   Ht '5\' 4"'$  (1.626 m)   Wt 115 lb 9.6 oz (52.4 kg)   SpO2 92%   BMI 19.84 kg/m        Assessment & Plan:  [name] comes in today with chief complaint of Hospitalization Follow-up ([place]. PASSING OUT )   Diagnosis and orders addressed:  1. Syncope, unspecified syncope type -  CMP14+EGFR - CBC with Differential/Platelet  2. Pneumonia of right lower lobe due to infectious organism - CMP14+EGFR - CBC with Differential/Platelet - ondansetron (ZOFRAN) 4 MG tablet; Take 1 tablet (4 mg total) by mouth every 8 (eight) hours as needed for nausea or vomiting.  Dispense: 20 tablet; Refill: 0  3. Closed fracture of nasal bone with routine healing, subsequent encounter - CMP14+EGFR - CBC with Differential/Platelet  4. Hospital discharge follow-up - CMP14+EGFR - CBC with Differential/Platelet  5. Pulmonary emphysema, unspecified emphysema type ([place]) - CMP14+EGFR - CBC with Differential/Platelet  6. Tobacco abuse - CMP14+EGFR - CBC with Differential/Platelet   Labs pending Continue Levofloxacin  Keep repeat chest x-ray appointment  Keep follow up with PCP next weeks Rest Force fluids Fall precautions  Discussed red flags to call office or return to ED  Evelina Dun, [place], FNP

## 2023-01-11 NOTE — Patient Instructions (Signed)
Community-Acquired Pneumonia, Adult Pneumonia is a lung infection that causes inflammation and the buildup of mucus and fluids in the lungs. This may cause coughing and difficulty breathing. Community-acquired pneumonia is pneumonia that develops in people who are not, and have not recently been, in a hospital or other health care facility. Usually, pneumonia develops as a result of an illness that is caused by a virus, such as the common cold and the flu (influenza). It can also be caused by bacteria or fungi. While the common cold and influenza can pass from person to person (are contagious), pneumonia itself is not considered contagious. What are the causes? This condition may be caused by: Viruses. Bacteria. Fungi. What increases the risk? The following factors may make you more likely to develop this condition: Being over age 65 or having certain medical conditions, such as: A long-term (chronic) disease, such as: chronic obstructive pulmonary disease (COPD), asthma, heart failure, diabetes, or kidney disease. A condition that increases the risk of breathing in (aspirating) mucus and other fluids from your mouth and nose. A weakened body defense system (immune system). Having had your spleen removed (splenectomy). The spleen is the organ that helps fight germs and infections. Not cleaning your teeth and gums well (poor dental hygiene). Using tobacco products. Traveling to places where germs that cause pneumonia are present or being near certain animals or animal habitats that could have germs that cause pneumonia. What are the signs or symptoms? Symptoms of this condition include: A dry cough or a wet (productive) cough. A fever, sweating, or chills. Chest pain, especially when breathing deeply or coughing. Fast breathing, difficulty breathing, or shortness of breath. Tiredness (fatigue) and muscle aches. How is this diagnosed? This condition may be diagnosed based on your medical  history or a physical exam. You may also have tests, including: Imaging, such as a chest X-ray or lung ultrasound. Tests of: The level of oxygen and other gases in your blood. Mucus from your lungs (sputum). Fluid around your lungs (pleural fluid). Your urine. How is this treated? Treatment for this condition depends on many factors, such as the cause of your pneumonia, your medicines, and other medical conditions that you have. For most adults, pneumonia may be treated at home. In some cases, treatment must happen in a hospital and may include: Medicines that are given by mouth (orally) or through an IV, including: Antibiotic medicines, if bacteria caused the pneumonia. Medicines that kill viruses (antiviral medicines), if a virus caused the pneumonia. Oxygen therapy. Severe pneumonia, although rare, may require the following treatments: Mechanical ventilation.This procedure uses a machine to help you breathe if you cannot breathe well on your own or maintain a safe level of blood oxygen. Thoracentesis. This procedure removes any buildup of pleural fluid to help with breathing. Follow these instructions at home:  Medicines Take over-the-counter and prescription medicines only as told by your health care provider. Take cough medicine only if you have trouble sleeping. Cough medicine can prevent your body from removing mucus from your lungs. If you were prescribed antibiotics, take them as told by your health care provider. Do not stop taking the antibiotic even if you start to feel better. Lifestyle     Do not drink alcohol. Do not use any products that contain nicotine or tobacco. These products include cigarettes, chewing tobacco, and vaping devices, such as e-cigarettes. If you need help quitting, ask your health care provider. Eat a healthy diet. This includes plenty of vegetables, fruits, whole grains, low-fat   dairy products, and lean protein. General instructions Rest a lot and  get at least 8 hours of sleep each night. Sleep in a partly upright position at night. Place a few pillows under your head or sleep in a reclining chair. Return to your normal activities as told by your health care provider. Ask your health care provider what activities are safe for you. Drink enough fluid to keep your urine pale yellow. This helps to thin the mucus in your lungs. If your throat is sore, gargle with a mixture of salt and water 3-4 times a day or as needed. To make salt water, completely dissolve -1 tsp (3-6 g) of salt in 1 cup (237 mL) of warm water. Keep all follow-up visits. How is this prevented? You can lower your risk of developing community-acquired pneumonia by: Getting the pneumonia vaccine. There are different types and schedules of pneumonia vaccines. Ask your health care provider which option is best for you. Consider getting the pneumonia vaccine if: You are older than 63 years of age. You are 19-65 years of age and are receiving cancer treatment, have chronic lung disease, or have other medical conditions that affect your immune system. Ask your health care provider if this applies to you. Getting your influenza vaccine every year. Ask your health care provider which type of vaccine is best for you. Getting regular dental checkups. Washing your hands often with soap and water for at least 20 seconds. If soap and water are not available, use hand sanitizer. Contact a health care provider if: You have a fever. You have trouble sleeping because you cannot control your cough with cough medicine. Get help right away if: Your shortness of breath becomes worse. Your chest pain increases. Your sickness becomes worse, especially if you are an older adult or have a weak immune system. You cough up blood. These symptoms may be an emergency. Get help right away. Call 911. Do not wait to see if the symptoms will go away. Do not drive yourself to the  hospital. Summary Pneumonia is an infection of the lungs. Community-acquired pneumonia develops in people who have not been in the hospital. It can be caused by bacteria, viruses, or fungi. This condition may be treated with antibiotics or antiviral medicines. Severe pneumonia may require a hospital stay and treatment to help with breathing. This information is not intended to replace advice given to you by your health care provider. Make sure you discuss any questions you have with your health care provider. Document Revised: 01/03/2022 Document Reviewed: 01/03/2022 Elsevier Patient Education  2023 [employer]  

## 2023-01-11 NOTE — Addendum Note (Signed)
Addended by: Ladean Raya on: 01/11/2023 03:38 PM   Modules accepted: Orders

## 2023-01-12 LAB — CMP14+EGFR
ALT: 22 IU/L (ref 0–32)
AST: 33 IU/L (ref 0–40)
Albumin/Globulin Ratio: 2.2 (ref 1.2–2.2)
Albumin: 4.1 g/dL (ref 3.9–4.9)
Alkaline Phosphatase: 59 IU/L (ref 44–121)
BUN/Creatinine Ratio: 13 (ref 12–28)
BUN: 13 mg/dL (ref 8–27)
Bilirubin Total: <0.2 mg/dL (ref 0.0–1.2)
CO2: 25 mmol/L (ref 20–29)
Calcium: 9.4 mg/dL (ref 8.7–10.3)
Chloride: 98 mmol/L (ref 96–106)
Creatinine, Ser: 1 mg/dL (ref 0.57–1.00)
Globulin, Total: 1.9 g/dL (ref 1.5–4.5)
Glucose: 99 mg/dL (ref 70–99)
Potassium: 4.3 mmol/L (ref 3.5–5.2)
Sodium: 138 mmol/L (ref 134–144)
Total Protein: 6 g/dL (ref 6.0–8.5)
eGFR: 64 mL/min/{1.73_m2} (ref >59)

## 2023-01-12 LAB — CBC WITH DIFFERENTIAL/PLATELET
Basophils Absolute: 0 10*3/uL (ref 0.0–0.2)
Basos: 1 %
EOS (ABSOLUTE): 0.2 10*3/uL (ref 0.0–0.4)
Eos: 3 %
Hematocrit: 41.8 % (ref 34.0–46.6)
Hemoglobin: 14.6 g/dL (ref 11.1–15.9)
Immature Grans (Abs): 0 10*3/uL (ref 0.0–0.1)
Immature Granulocytes: 0 %
Lymphocytes Absolute: 2.2 10*3/uL (ref 0.7–3.1)
Lymphs: 25 %
MCH: 31.2 pg (ref 26.6–33.0)
MCHC: 34.9 g/dL (ref 31.5–35.7)
MCV: 89 fL (ref 79–97)
Monocytes Absolute: 0.8 10*3/uL (ref 0.1–0.9)
Monocytes: 10 %
Neutrophils Absolute: 5.4 10*3/uL (ref 1.4–7.0)
Neutrophils: 61 %
Platelets: 277 10*3/uL (ref 150–450)
RBC: 4.68 x10E6/uL (ref 3.77–5.28)
RDW: 12.6 % (ref 11.7–15.4)
WBC: 8.7 10*3/uL (ref 3.4–10.8)

## 2023-01-14 ENCOUNTER — Ambulatory Visit: Payer: Medicare PPO | Admitting: Nurse Practitioner

## 2023-01-16 ENCOUNTER — Encounter: Payer: Self-pay | Admitting: Family Medicine

## 2023-01-16 ENCOUNTER — Ambulatory Visit: Payer: Medicare PPO | Admitting: Family Medicine

## 2023-01-16 VITALS — BP 115/72 | HR 73 | Temp 98.6°F | Ht 64.0 in | Wt 116.0 lb

## 2023-01-16 DIAGNOSIS — F17200 Nicotine dependence, unspecified, uncomplicated: Secondary | ICD-10-CM

## 2023-01-16 DIAGNOSIS — Z8701 Personal history of pneumonia (recurrent): Secondary | ICD-10-CM

## 2023-01-16 DIAGNOSIS — F17210 Nicotine dependence, cigarettes, uncomplicated: Secondary | ICD-10-CM | POA: Diagnosis not present

## 2023-01-16 DIAGNOSIS — R634 Abnormal weight loss: Secondary | ICD-10-CM

## 2023-01-16 DIAGNOSIS — Z0001 Encounter for general adult medical examination with abnormal findings: Secondary | ICD-10-CM | POA: Diagnosis not present

## 2023-01-16 DIAGNOSIS — J029 Acute pharyngitis, unspecified: Secondary | ICD-10-CM

## 2023-01-16 DIAGNOSIS — R55 Syncope and collapse: Secondary | ICD-10-CM

## 2023-01-16 DIAGNOSIS — Z0000 Encounter for general adult medical examination without abnormal findings: Secondary | ICD-10-CM

## 2023-01-16 DIAGNOSIS — J432 Centrilobular emphysema: Secondary | ICD-10-CM | POA: Diagnosis not present

## 2023-01-16 DIAGNOSIS — R42 Dizziness and giddiness: Secondary | ICD-10-CM | POA: Diagnosis not present

## 2023-01-16 MED ORDER — PREDNISONE 20 MG PO TABS: ORAL_TABLET | ORAL | 0 refills | Status: DC

## 2023-01-16 NOTE — Progress Notes (Signed)
Alicia Sanchez is a 62 y.o. female presents to office today for annual physical exam examination.    Concerns today include: 1. Unexplained weight loss/ syncope/ recent PNA She was recently treated for pneumonia with Levaquin.  Last dose was yesterday.  She has seen her pulmonologist already for follow-up and repeat chest x-ray recommended in 3 weeks.  She would like to get that done here if possible.  She reports ongoing wheezing.  No hemoptysis reported.  She had a syncopal episode during that pneumonia and upon further discussion she reports that she has had recurrent syncope but this was the first time that she actually developed an injury associated with it.  She fractured her nasal bone and would like to see an ENT if possible for this.  She reports ongoing dizziness.  She has continued most of her medications and has follow-up with her psychiatrist on the fourth of the month.  She admits she has discontinued the atorvastatin and gabapentin.  Continues to have unexplained weight loss but has had a 1 pound weight gain since the ER visit.  She reports night sweats.  She is now having throat pain.  This seemingly occurred after the fall.  She continues to smoke about half a pack per day but this is down from her normal.  No rectal bleeding.  No abdominal pain reported except for at the xiphoid process which is her normal.  Immunizations needed: Immunization History  Administered Date(s) Administered   Influenza Split 08/25/2016   Influenza,inj,Quad PF,6+ Mos 09/25/2017, 07/16/2018, 10/25/2020, 08/30/2021, 09/24/2022   Influenza-Unspecified 09/25/2017, 07/16/2018, 08/30/2021   Moderna Sars-Covid-2 Vaccination 01/19/2020, 02/16/2020   Pneumococcal Polysaccharide-23 05/23/2018   Respiratory Syncytial Virus Vaccine,Recomb Aduvanted(Arexvy) 09/24/2022   Tdap 05/19/2007, 07/08/2014     Past Medical History:  Diagnosis Date   Anxiety    COPD (chronic obstructive pulmonary disease) ([place])     Osteoporosis    Social History   Socioeconomic History   Marital status: Legally Separated    Spouse name: Not on file   Number of children: Not on file   Years of education: Not on file   Highest education level: Not on file  Occupational History   Not on file  Tobacco Use   Smoking status: Every Day    Packs/day: 0.50    Years: 42.00    Total pack years: 21.00    Types: Cigarettes   Smokeless tobacco: Never   Tobacco comments:    1/2 pack or less   Vaping Use   Vaping Use: Every day  Substance and Sexual Activity   Alcohol use: No   Drug use: No   Sexual activity: Yes    Birth control/protection: Surgical  Other Topics Concern   Not on file  Social History Narrative   Not on file   Social Determinants of Health   Financial Resource Strain: Low Risk  (12/20/2022)   Overall Financial Resource Strain (CARDIA)    Difficulty of Paying Living Expenses: Not hard at all  Food Insecurity: No Food Insecurity (12/20/2022)   Hunger Vital Sign    Worried About Running Out of Food in the Last Year: Never true    Ran Out of Food in the Last Year: Never true  Transportation Needs: No Transportation Needs (12/20/2022)   PRAPARE - Hydrologist (Medical): No    Lack of Transportation (Non-Medical): No  Physical Activity: Inactive (12/20/2022)   Exercise Vital Sign    Days of Exercise per  Week: 0 days    Minutes of Exercise per Session: 0 min  Stress: No Stress Concern Present (12/20/2022)   Alicia Sanchez    Feeling of Stress : Not at all  Social Connections: Moderately Isolated (12/20/2022)   Social Connection and Isolation Panel [NHANES]    Frequency of Communication with Friends and Family: More than three times a week    Frequency of Social Gatherings with Friends and Family: More than three times a week    Attends Religious Services: More than 4 times per year    Active Member of [employer] or  Organizations: No    Attends Archivist Meetings: Never    Marital Status: Separated  Intimate Partner Violence: Not At Risk (12/20/2022)   Humiliation, Afraid, Rape, and Kick questionnaire    Fear of Current or Ex-Partner: No    Emotionally Abused: No    Physically Abused: No    Sexually Abused: No   Past Surgical History:  Procedure Laterality Date   ABDOMINAL HYSTERECTOMY     Family History  Problem Relation Age of Onset   COPD Mother    COPD Father    Diabetes type II Sister    Kidney failure Sister    COPD Maternal Grandfather    Diabetes type II Brother    Breast cancer Neg Hx     Current Outpatient Medications:    albuterol (VENTOLIN HFA) 108 (90 Base) MCG/ACT inhaler, INHALE 2 PUFFS INTO THE LUNGS EVERY 6 (SIX) HOURS AS NEEDED FOR WHEEZING OR SHORTNESS OF BREATH., Disp: 3 each, Rfl: 3   alendronate (FOSAMAX) 70 MG tablet, TAKE 1 TABLET EVERY 7 DAYS WITH A FULL GLASS OF WATER ON AN EMPTY STOMACH, Disp: 12 tablet, Rfl: 3   ALPRAZolam (XANAX) 0.5 MG tablet, TAKE 1 TABLET BY MOUTH THREE TIMES DAILY AND  1  EXTRA  PRN  FOR  INCREASED  ANXIETY, Disp: 120 tablet, Rfl: 2   Budeson-Glycopyrrol-Formoterol (BREZTRI AEROSPHERE) 160-9-4.8 MCG/ACT AERO, Inhale 2 puffs into the lungs 2 (two) times daily. Please fill #3 inahlers for 3 month supply, Disp: 32.1 g, Rfl: 11   Budeson-Glycopyrrol-Formoterol (BREZTRI AEROSPHERE) 160-9-4.8 MCG/ACT AERO, Inhale 2 puffs into the lungs in the morning and at bedtime., Disp: 5 g, Rfl: 0   busPIRone (BUSPAR) 30 MG tablet, TAKE 1 TABLET TWICE DAILY, Disp: 180 tablet, Rfl: 10   DULoxetine (CYMBALTA) 60 MG capsule, TAKE 2 CAPSULES (120 MG TOTAL) EVERY DAY, Disp: 180 capsule, Rfl: 0   ipratropium-albuterol (DUONEB) 0.5-2.5 (3) MG/3ML SOLN, Take 3 mLs by nebulization every 6 (six) hours as needed., Disp: 360 mL, Rfl: 5   meloxicam (MOBIC) 7.5 MG tablet, Take 1 tablet (7.5 mg total) by mouth daily., Disp: 90 tablet, Rfl: 3   nitroGLYCERIN (NITROSTAT)  0.4 MG SL tablet, Place 1 tablet (0.4 mg total) under the tongue every 5 (five) minutes x 3 doses as needed for chest pain (if o relief after 2nd dose, proceed to the ED for an evaluation or call 911)., Disp: 25 tablet, Rfl: 1   QUEtiapine (SEROQUEL) 200 MG tablet, Take 1 tablet (200 mg total) by mouth at bedtime., Disp: 90 tablet, Rfl: 0   Respiratory Therapy Supplies (NEBULIZER/TUBING/MOUTHPIECE) KIT, 1 Units by Does not apply route 4 (four) times daily. GIVE face mask and tubing for nebulizer. J44.1, Disp: 1 kit, Rfl: 0   rOPINIRole (REQUIP) 1 MG tablet, Take 1-4 tablets (1-4 mg total) by mouth at bedtime., Disp: 360 tablet,  Rfl: 3   aspirin EC 81 MG tablet, Take 81 mg by mouth daily. (Patient not taking: Reported on 01/16/2023), Disp: , Rfl:    atorvastatin (LIPITOR) 40 MG tablet, TAKE 1 TABLET EVERY DAY (NEED MD APPOINTMENT FOR REFILLS) (Patient not taking: Reported on 01/16/2023), Disp: 90 tablet, Rfl: 3   calcium carbonate (OS-CAL) 1250 (500 Ca) MG chewable tablet, Chew 1 tablet (1,250 mg total) by mouth daily. (Patient not taking: Reported on 01/16/2023), Disp: 100 tablet, Rfl: 3   cetirizine (ZYRTEC) 10 MG tablet, Take 1 tablet by mouth once daily (Patient not taking: Reported on 01/16/2023), Disp: 90 tablet, Rfl: 0   FOLIC ACID PO, Take 1 tablet by mouth daily. (Patient not taking: Reported on 01/16/2023), Disp: , Rfl:    levofloxacin (LEVAQUIN) 750 MG tablet, Take 750 mg by mouth daily. (Patient not taking: Reported on 01/16/2023), Disp: , Rfl:   Allergies  Allergen Reactions   Doxycycline     Chest pain     ROS: Review of Systems Pertinent items noted in HPI and remainder of comprehensive ROS otherwise negative.    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 Throat:  No oropharyngeal masses appreciated Neck: no adenopathy, supple, symmetrical, trachea midline, and thyroid not enlarged, symmetric, no tenderness/mass/nodules Back: symmetric, no curvature. ROM normal. No CVA tenderness. Lungs:  Global expiratory wheezes Heart: regular rate and rhythm, S1, S2 normal, no murmur, click, rub or gallop Abdomen: soft, non-tender; bowel sounds normal; no masses,  no organomegaly Extremities: extremities normal, atraumatic, no cyanosis or edema Pulses: 2+ and symmetric Skin: Skin color, texture, turgor normal. No rashes or lesions Lymph nodes: Cervical, supraclavicular, and axillary nodes normal. Neurologic: Grossly normal; except for exotropia of the eye as above Chest: Tender at the xiphoid process     01/16/2023   11:00 AM 01/11/2023    3:08 PM 12/20/2022    8:53 AM  Depression screen PHQ 2/9  Decreased Interest 1 1 0  Down, Depressed, Hopeless 1 1 0  PHQ - 2 Score 2 2 0  Altered sleeping 1 3   Tired, decreased energy 0 2   Change in appetite 1 3   Feeling bad or failure about yourself  0 1   Trouble concentrating 1 1   Moving slowly or fidgety/restless 0 1   Suicidal thoughts 0 0   PHQ-9 Score 5 13   Difficult doing work/chores Not difficult at all Somewhat difficult       01/16/2023   10:59 AM 01/11/2023    3:09 PM 11/21/2021    8:11 AM 10/25/2021    3:17 PM  GAD 7 : Generalized Anxiety Score  Nervous, Anxious, on Edge '1 1 2 3  '$ Control/stop worrying '1 1 2 3  '$ Worry too much - different things '1 1 2 3  '$ Trouble relaxing 0 '1 2 2  '$ Restless 0 '1 1 2  '$ Easily annoyed or irritable 0 '1 1 2  '$ Afraid -  awful might happen 0 '1 1 1  '$ Total GAD 7 Score  '3 7 11 16  '$ Anxiety Difficulty Somewhat difficult Somewhat difficult Somewhat difficult Very difficult    Assessment/ Plan: Alicia Sanchez here for annual physical exam.   Annual physical exam  Unexplained weight loss - Plan: Ambulatory referral to Gastroenterology, Ambulatory referral to ENT  Centrilobular emphysema ([place]) - Plan: predniSONE (DELTASONE) 20 MG tablet  Syncope, unspecified syncope type  History of bacterial pneumonia - Plan: DG Chest 2 View  Sore throat - Plan: Ambulatory referral to ENT  Smoker - Plan: Ambulatory referral to ENT  Dizziness - Plan: Ambulatory referral to ENT  Due for pap smear.  Uncertain etiology of weight loss.  Has a 8lb weight loss since last visit.  CT has been unrevealing.  She is a smoker.  Urgent referral to ENT for throat pain in a smoker and GI.  Of note cologuard negative 3 years ago.  Prednisone given for wheezing.  CXR preordered to follow up on PNA in 2-3 weeks.  Will cc to Dr Vaughan Browner.  Uncertain etiology of dizziness and syncope.  Highly recommended cardiac eval.  Offered ZIO but she will schedule with Dr Harl Bowie ASAP.  Patient to follow up in 53m Nilah Belcourt M. GLajuana Ripple DO

## 2023-01-16 NOTE — Patient Instructions (Signed)
Schedule xray appointment for around 02/06/23 at check out Schedule appt with Dr Harl Bowie to follow up loss of consciousness I have referred to the ENT and the gastroenterologist for the unexplained weight loss.

## 2023-01-17 ENCOUNTER — Encounter: Payer: Self-pay | Admitting: Gastroenterology

## 2023-01-17 ENCOUNTER — Telehealth: Payer: Self-pay | Admitting: Family Medicine

## 2023-01-21 ENCOUNTER — Ambulatory Visit (INDEPENDENT_AMBULATORY_CARE_PROVIDER_SITE_OTHER): Payer: Medicare PPO | Admitting: Adult Health

## 2023-01-21 ENCOUNTER — Encounter: Payer: Self-pay | Admitting: Adult Health

## 2023-01-21 DIAGNOSIS — F410 Panic disorder [episodic paroxysmal anxiety] without agoraphobia: Secondary | ICD-10-CM | POA: Diagnosis not present

## 2023-01-21 DIAGNOSIS — F331 Major depressive disorder, recurrent, moderate: Secondary | ICD-10-CM

## 2023-01-21 DIAGNOSIS — G4700 Insomnia, unspecified: Secondary | ICD-10-CM | POA: Diagnosis not present

## 2023-01-21 DIAGNOSIS — F411 Generalized anxiety disorder: Secondary | ICD-10-CM

## 2023-01-21 MED ORDER — HYDROXYZINE HCL 50 MG PO TABS: ORAL_TABLET | ORAL | 2 refills | Status: DC

## 2023-01-21 MED ORDER — ALPRAZOLAM 0.5 MG PO TABS: ORAL_TABLET | ORAL | 2 refills | Status: DC

## 2023-01-21 NOTE — Progress Notes (Signed)
Sarde Lene PW:[id number] [birth year] 62 y.o.  Subjective:   Patient ID:  Alicia Sanchez is a 62 y.o. (DOB 12/21/1960) female.  Chief Complaint: No chief complaint on file.   HPI Alicia Sanchez presents to the office today for follow-up of GAD, MDD, PTSD, insomnia and panic attacks.  Describes mood today as "ok". Pleasant. Tearful. Mood symptoms - reports decreased depression - "getting a little better". Reports some anxiety and irritability - "a little". Reports "several" panic attacks - uncertain as to reason. Mood is variable. Stating "I feel like I'm doing better overall". Spending time with family. Stable interest and motivation. Taking medications prescribed.                                                                                      Energy levels "ok". Active, does not have a regular exercise routine.   Enjoys some usual interests and activities. Married, but legally separated. Has two daughters and 2 step daughters. Has 3 sisters and 2 brothers. Mother living - talks to her. Spending time with family. Involved in church. Appetite adequate. Weight loss - 50 pounds - 174 to 115 pounds. Sleeps varies. Averages 4 to 5 hours a day. Focus and concentration stable "better than I was". Completing tasks. Managing aspects of household. Disabled since 2020.  Denies SI or HI.  Denies AH or VH. Denies self harm. Denies substance use.  Previous medication trials: Paxil, Wellbutrin, Depakote, Trazadone and others   [place] Office Visit from 01/16/2023 in [facility] Office Visit from 01/11/2023 in [facility] Office Visit from 11/21/2021 in [facility] Office Visit from 10/25/2021 in [place] Office Visit from 07/31/2021 in [place]  Total GAD-7 Score '3 7 11 16 19      '$ Mini-Mental    [place] Office Visit from 10/25/2021 in [facility]  Total Score (max 30 points ) 30      PHQ2-9    [place] Office Visit from 01/16/2023 in [facility] Office Visit from 01/11/2023 in [facility] Clinical Support from 12/20/2022 in [facility] Office Visit from 11/21/2021 in [facility] Office Visit from 10/25/2021 in [facility]  PHQ-2 Total Score 2 2 0 3 4  PHQ-9 Total Score 5 13 -- 13 [place] [facility] from 08/08/2021 in [facility] at [place] No Risk        Review of Systems:  Review of Systems  Musculoskeletal:  Negative for gait problem.  Neurological:  Negative for tremors.  Psychiatric/Behavioral:         Please refer to HPI    Medications: I have reviewed the patient's current medications.  Current Outpatient Medications  Medication Sig Dispense Refill   hydrOXYzine (ATARAX) 50 MG tablet Take one to two tablets at bedtime as needed for sleep. 30 tablet 2  albuterol (VENTOLIN HFA) 108 (90 Base) MCG/ACT inhaler INHALE 2 PUFFS INTO THE LUNGS EVERY 6 (SIX) HOURS AS NEEDED FOR WHEEZING OR SHORTNESS OF BREATH. 3 each 3   alendronate (FOSAMAX) 70 MG tablet TAKE 1 TABLET EVERY 7 DAYS WITH A FULL GLASS OF WATER ON AN EMPTY STOMACH 12 tablet 3   ALPRAZolam (XANAX) 0.5 MG tablet TAKE 1 TABLET BY MOUTH THREE TIMES DAILY AND  1  EXTRA  PRN  FOR  INCREASED  ANXIETY 120 tablet 2   aspirin EC 81 MG tablet Take 81 mg by mouth daily. (Patient not taking: Reported on 01/16/2023)     atorvastatin (LIPITOR) 40 MG tablet TAKE 1 TABLET EVERY DAY (NEED MD APPOINTMENT FOR REFILLS) (Patient not taking: Reported on 01/16/2023) 90 tablet 3   Budeson-Glycopyrrol-Formoterol (BREZTRI AEROSPHERE) 160-9-4.8 MCG/ACT AERO Inhale 2 puffs into the  lungs 2 (two) times daily. Please fill #3 inahlers for 3 month supply 32.1 g 11   Budeson-Glycopyrrol-Formoterol (BREZTRI AEROSPHERE) 160-9-4.8 MCG/ACT AERO Inhale 2 puffs into the lungs in the morning and at bedtime. 5 g 0   busPIRone (BUSPAR) 30 MG tablet TAKE 1 TABLET TWICE DAILY 180 tablet 10   calcium carbonate (OS-CAL) 1250 (500 Ca) MG chewable tablet Chew 1 tablet (1,250 mg total) by mouth daily. (Patient not taking: Reported on 01/16/2023) 100 tablet 3   cetirizine (ZYRTEC) 10 MG tablet Take 1 tablet by mouth once daily (Patient not taking: Reported on 01/16/2023) 90 tablet 0   DULoxetine (CYMBALTA) 60 MG capsule TAKE 2 CAPSULES (120 MG TOTAL) EVERY DAY [id number] capsule 0   FOLIC ACID PO Take 1 tablet by mouth daily. (Patient not taking: Reported on 01/16/2023)     ipratropium-albuterol (DUONEB) 0.5-2.5 (3) MG/3ML SOLN Take 3 mLs by nebulization every 6 (six) hours as needed. 360 mL 5   meloxicam (MOBIC) 7.5 MG tablet Take 1 tablet (7.5 mg total) by mouth daily. 90 tablet 3   nitroGLYCERIN (NITROSTAT) 0.4 MG SL tablet Place 1 tablet (0.4 mg total) under the tongue every 5 (five) minutes x 3 doses as needed for chest pain (if o relief after 2nd dose, proceed to the ED for an evaluation or call 911). 25 tablet 1   predniSONE (DELTASONE) 20 MG tablet 2 po at same time daily for 5 days 10 tablet 0   Respiratory Therapy Supplies (NEBULIZER/TUBING/MOUTHPIECE) KIT 1 Units by Does not apply route 4 (four) times daily. GIVE face mask and tubing for nebulizer. J44.1 1 kit 0   rOPINIRole (REQUIP) 1 MG tablet Take 1-4 tablets (1-4 mg total) by mouth at bedtime. 360 tablet 3   No current facility-administered medications for this visit.    Medication Side Effects: None  Allergies:  Allergies  Allergen Reactions   Doxycycline     Chest pain    Past Medical History:  Diagnosis Date   Anxiety    COPD (chronic obstructive pulmonary disease) ([place])    Osteoporosis     Past Medical History, Surgical  history, Social history, and Family history were reviewed and updated as appropriate.   Please see review of systems for further details on the patient's review from today.   Objective:   Physical Exam:  There were no vitals taken for this visit.  Physical Exam Constitutional:      General: She is not in acute distress. Musculoskeletal:        General: No deformity.  Neurological:     Mental Status: She is alert and oriented to person, place,  and time.     Coordination: Coordination normal.  Psychiatric:        Attention and Perception: Attention and perception normal. She does not perceive auditory or visual hallucinations.        Mood and Affect: Mood normal. Mood is not anxious or depressed. Affect is not labile, blunt, angry or inappropriate.        Speech: Speech normal.        Behavior: Behavior normal.        Thought Content: Thought content normal. Thought content is not paranoid or delusional. Thought content does not include homicidal or suicidal ideation. Thought content does not include homicidal or suicidal plan.        Cognition and Memory: Cognition and memory normal.        Judgment: Judgment normal.     Comments: Insight intact     Lab Review:     Component Value Date/Time   NA 138 01/11/2023 1541   K 4.3 01/11/2023 1541   CL 98 01/11/2023 1541   CO2 25 01/11/2023 1541   GLUCOSE 99 01/11/2023 1541   GLUCOSE 101 (H) 08/08/2021 1355   BUN 13 01/11/2023 1541   CREATININE 1.00 01/11/2023 1541   CALCIUM 9.4 01/11/2023 1541   PROT 6.0 01/11/2023 1541   ALBUMIN 4.1 01/11/2023 1541   AST 33 01/11/2023 1541   ALT 22 01/11/2023 1541   ALKPHOS 59 01/11/2023 1541   BILITOT <0.2 01/11/2023 1541   GFRNONAA >60 08/08/2021 1355   GFRAA 91 06/03/2020 1505       Component Value Date/Time   WBC 8.7 01/11/2023 1541   WBC 12.9 (H) 08/08/2021 1355   RBC 4.68 01/11/2023 1541   RBC 5.04 08/08/2021 1355   HGB 14.6 01/11/2023 1541   HCT 41.8 01/11/2023 1541   PLT 277  01/11/2023 1541   MCV 89 01/11/2023 1541   MCH 31.2 01/11/2023 1541   MCH 31.9 08/08/2021 1355   MCHC 34.9 01/11/2023 1541   MCHC 34.7 08/08/2021 1355   RDW 12.6 01/11/2023 1541   LYMPHSABS 2.2 01/11/2023 1541   MONOABS 0.5 05/25/2018 0602   EOSABS 0.2 01/11/2023 1541   BASOSABS 0.0 01/11/2023 1541    No results found for: "POCLITH", "LITHIUM"   No results found for: "PHENYTOIN", "PHENOBARB", "VALPROATE", "CBMZ"   .res Assessment: Plan:    Plan:  PDMP reviewed  Xanax 0.'5mg'$  - 4 times daily  Add Hydroxyzine '50mg'$  - 1 to 2 at hs.  D/C Seroquel '200mg'$  at hs - taking "some" nights - maybe 4 times a week.  PCP: Cymbalta '120mg'$  every morning Buspar '30mg'$  BID Gabapentin '100mg'$  at hs - leg/feet cramps  Refer to therapist  RTC 3 months  Patient advised to contact office with any questions, adverse effects, or acute worsening in signs and symptoms.  Discussed potential benefits, risk, and side effects of benzodiazepines to include potential risk of tolerance and dependence, as well as possible drowsiness. Advised patient not to drive if experiencing drowsiness and to take lowest possible effective dose to minimize risk of dependence and tolerance.  Diagnoses and all orders for this visit:  Moderate episode of recurrent major depressive disorder (HCC)  Generalized anxiety disorder -     ALPRAZolam (XANAX) 0.5 MG tablet; TAKE 1 TABLET BY MOUTH THREE TIMES DAILY AND  1  EXTRA  PRN  FOR  INCREASED  ANXIETY  Panic attacks -     ALPRAZolam (XANAX) 0.5 MG tablet; TAKE 1 TABLET BY MOUTH THREE TIMES DAILY  AND  1  EXTRA  PRN  FOR  INCREASED  ANXIETY  Insomnia, unspecified type  Other orders -     hydrOXYzine (ATARAX) 50 MG tablet; Take one to two tablets at bedtime as needed for sleep.     Please see After Visit Summary for patient specific instructions.  Future Appointments  Date Time Provider [place]  01/22/2023  8:30 AM Finis Bud, NP [facility]   02/06/2023 10:20 AM Danis, Kirke Corin, MD [facility]  04/11/2023 11:40 AM Arnoldo Lenis, MD [facility]  04/17/2023  4:00 PM Janora Norlander, DO [facility] None  12/23/2023 10:15 AM [facility]NUAL WELLNESS VISIT [facility] None    No orders of the defined types were placed in this encounter.   -------------------------------

## 2023-01-22 ENCOUNTER — Ambulatory Visit: Payer: Medicare PPO | Admitting: Nurse Practitioner

## 2023-01-22 NOTE — Progress Notes (Deleted)
   Office Visit    Patient Name: Alicia Sanchez Date of Encounter: 01/22/2023  PCP:  Janora Norlander [place]  Cardiologist:  Carlyle Dolly, MD *** Advanced Practice Provider:  Finis Bud, NP Electrophysiologist:  None  [phone]  Chief Complaint    Alicia Sanchez is a 62 y.o. female with a hx of syncope, palpitations, history of chest pain, COPD, osteoporosis, anxiety/depression/panic attacks who presents today for ***  Blood pressure issues, syncopal episode?    Past Medical History    Past Medical History:  Diagnosis Date   Anxiety    COPD (chronic obstructive pulmonary disease) ([place])    Osteoporosis    Past Surgical History:  Procedure Laterality Date   ABDOMINAL HYSTERECTOMY      Allergies  Allergies  Allergen Reactions   Doxycycline     Chest pain    History of Present Illness    Alicia Sanchez is a 62 y.o. female with a past medical history as mentioned above.  Coronary CTA in 2021 revealed coronary calcium score of 45, nonobstructive mild CAD noted. History consistent with orthostatic syncope. Last seen by Dr. Carlyle Dolly on Mar 27, 2022.  EKG showed normal sinus rhythm.  Conservative measures discussed regarding orthostatic syncope, recommended if ongoing symptoms may need to consider Florinef.   Today she presents for follow-up.  She states the following  EKGs/Labs/Other Studies Reviewed:   The following studies were reviewed today: ***  EKG:  EKG is *** ordered today.  The ekg ordered today demonstrates ***  Recent Labs: 11/28/2022: TSH 1.690 01/11/2023: ALT 22; BUN 13; Creatinine, Ser 1.00; Hemoglobin 14.6; Platelets 277; Potassium 4.3; Sodium 138  Recent Lipid Panel    Component Value Date/Time   CHOL 134 10/27/2021 1156   TRIG 138 10/27/2021 1156   HDL 62 10/27/2021 1156   CHOLHDL 2.2 10/27/2021 1156   LDLCALC 48 10/27/2021 1156    Risk Assessment/Calculations:  {Does this patient have  ATRIAL FIBRILLATION?[phone]}  Home Medications   No outpatient medications have been marked as taking for the 01/22/23 encounter (Appointment) with Finis Bud, NP.     Review of Systems   ***   All other systems reviewed and are otherwise negative except as noted above.  Physical Exam    VS:  There were no vitals taken for this visit. , BMI There is no height or weight on file to calculate BMI.  Wt Readings from Last 3 Encounters:  01/16/23 116 lb (52.6 kg)  01/11/23 115 lb 9.6 oz (52.4 kg)  01/09/23 113 lb 12.8 oz (51.6 kg)     GEN: Well nourished, well developed, in no acute distress. HEENT: normal. Neck: Supple, no JVD, carotid bruits, or masses. Cardiac: ***RRR, no murmurs, rubs, or gallops. No clubbing, cyanosis, edema.  ***Radials/PT 2+ and equal bilaterally.  Respiratory:  ***Respirations regular and unlabored, clear to auscultation bilaterally. GI: Soft, nontender, nondistended. MS: No deformity or atrophy. Skin: Warm and dry, no rash. Neuro:  Strength and sensation are intact. Psych: Normal affect.  Assessment & Plan    ***  {Are you ordering a CV Procedure (e.g. stress test, cath, DCCV, TEE, etc)?   Press F2        :[id number]      Disposition: Follow up {follow up:[id number]} with Carlyle Dolly, MD or APP.  Signed, Finis Bud, NP 01/22/2023, 8:25 AM [place]

## 2023-01-24 NOTE — Telephone Encounter (Signed)
Submitted application for breztri to [employer] for patient assistance.   Phone: [phone]

## 2023-01-25 DIAGNOSIS — Z0100 Encounter for examination of eyes and vision without abnormal findings: Secondary | ICD-10-CM | POA: Diagnosis not present

## 2023-01-25 DIAGNOSIS — H524 Presbyopia: Secondary | ICD-10-CM | POA: Diagnosis not present

## 2023-01-26 DIAGNOSIS — R079 Chest pain, unspecified: Secondary | ICD-10-CM | POA: Diagnosis not present

## 2023-01-26 DIAGNOSIS — J029 Acute pharyngitis, unspecified: Secondary | ICD-10-CM | POA: Diagnosis not present

## 2023-01-26 DIAGNOSIS — F17210 Nicotine dependence, cigarettes, uncomplicated: Secondary | ICD-10-CM | POA: Diagnosis not present

## 2023-01-26 DIAGNOSIS — J441 Chronic obstructive pulmonary disease with (acute) exacerbation: Secondary | ICD-10-CM | POA: Diagnosis not present

## 2023-01-26 DIAGNOSIS — Z20822 Contact with and (suspected) exposure to covid-19: Secondary | ICD-10-CM | POA: Diagnosis not present

## 2023-01-26 DIAGNOSIS — R0602 Shortness of breath: Secondary | ICD-10-CM | POA: Diagnosis not present

## 2023-01-26 DIAGNOSIS — Z720 Tobacco use: Secondary | ICD-10-CM | POA: Diagnosis not present

## 2023-01-27 ENCOUNTER — Encounter: Payer: Self-pay | Admitting: Family Medicine

## 2023-01-28 ENCOUNTER — Ambulatory Visit: Payer: Medicare PPO | Attending: Nurse Practitioner | Admitting: Nurse Practitioner

## 2023-01-28 ENCOUNTER — Other Ambulatory Visit (INDEPENDENT_AMBULATORY_CARE_PROVIDER_SITE_OTHER): Payer: Medicare HMO

## 2023-01-28 ENCOUNTER — Encounter: Payer: Self-pay | Admitting: Nurse Practitioner

## 2023-01-28 ENCOUNTER — Other Ambulatory Visit: Payer: Self-pay | Admitting: Nurse Practitioner

## 2023-01-28 ENCOUNTER — Telehealth: Payer: Self-pay | Admitting: Nurse Practitioner

## 2023-01-28 VITALS — BP 108/77 | HR 101 | Ht 64.5 in | Wt 117.0 lb

## 2023-01-28 DIAGNOSIS — Z79899 Other long term (current) drug therapy: Secondary | ICD-10-CM

## 2023-01-28 DIAGNOSIS — R42 Dizziness and giddiness: Secondary | ICD-10-CM | POA: Diagnosis not present

## 2023-01-28 DIAGNOSIS — R55 Syncope and collapse: Secondary | ICD-10-CM

## 2023-01-28 DIAGNOSIS — Z720 Tobacco use: Secondary | ICD-10-CM | POA: Diagnosis not present

## 2023-01-28 DIAGNOSIS — R031 Nonspecific low blood-pressure reading: Secondary | ICD-10-CM | POA: Diagnosis not present

## 2023-01-28 DIAGNOSIS — J449 Chronic obstructive pulmonary disease, unspecified: Secondary | ICD-10-CM | POA: Diagnosis not present

## 2023-01-28 MED ORDER — NITROGLYCERIN 0.4 MG SL SUBL: 0.4000 mg | SUBLINGUAL_TABLET | SUBLINGUAL | 2 refills | Status: AC | PRN

## 2023-01-28 MED ORDER — MIDODRINE HCL 2.5 MG PO TABS: 2.5000 mg | ORAL_TABLET | Freq: Three times a day (TID) | ORAL | 0 refills | Status: AC | PRN

## 2023-01-28 NOTE — Patient Instructions (Signed)
Medication Instructions:  Your physician has recommended you make the following change in your medication:  START midodrine 2.5 mg three times a day as needed for blood pressure less than 90/60 Continue all other medications as directed  Labwork: Labs due in 2-3 weeks  CBC/Diff CMET MAG Thyroid panel  Testing/Procedures: Your physician has requested that you have an echocardiogram. Echocardiography is a painless test that uses sound waves to create images of your heart. It provides your doctor with information about the size and shape of your heart and how well your heart's chambers and valves are working. This procedure takes approximately one hour. There are no restrictions for this procedure. Please do NOT wear cologne, perfume, aftershave, or lotions (deodorant is allowed). Please arrive 15 minutes prior to your appointment time.   ZIO- Long Term Monitor Instructions   Your physician has requested you wear your ZIO patch monitor 14 days.   This is a single patch monitor.  [employer] supplies one patch monitor per enrollment.  Additional stickers are not available.   Please do not apply patch if you will be having a Nuclear Stress Test, Echocardiogram, Cardiac CT, MRI, or Chest Xray during the time frame you would be wearing the monitor. The patch cannot be worn during these tests.  You cannot remove and re-apply the ZIO XT patch monitor.   Your ZIO patch monitor will be sent USPS Priority mail from [facility] directly to your home address. The monitor may also be mailed to a PO BOX if home delivery is not available.   It may take 3-5 days to receive your monitor after you have been enrolled.   Once you have received you monitor, please review enclosed instructions.  Your monitor has already been registered assigning a specific monitor serial # to you.   Applying the monitor   Shave hair from upper left chest.   Hold abrader disc by orange tab.  Rub abrader in 40 strokes  over left upper chest as indicated in your monitor instructions.   Clean area with 4 enclosed alcohol pads .  Use all pads to assure are is cleaned thoroughly.  Let dry.   Apply patch as indicated in monitor instructions.  Patch will be place under collarbone on left side of chest with arrow pointing upward.   Rub patch adhesive wings for 2 minutes.Remove white label marked "1".  Remove white label marked "2".  Rub patch adhesive wings for 2 additional minutes.   While looking in a mirror, press and release button in center of patch.  A small green light will flash 3-4 times .  This will be your only indicator the monitor has been turned on.     Do not shower for the first 24 hours.  You may shower after the first 24 hours.   Press button if you feel a symptom. You will hear a small click.  Record Date, Time and Symptom in the Patient Log Book.   When you are ready to remove patch, follow instructions on last 2 pages of Patient Log Book.  Stick patch monitor onto last page of Patient Log Book.   Place Patient Log Book in [place] box.  Use locking tab on box and tape box closed securely.  The [employer] has [employer] on it.  Please place in mailbox as soon as possible.  Your physician should have your test results approximately 7 days after the monitor has been mailed back to [facility].  Call [place] at [phone] if you have questions regarding your ZIO XT patch monitor.  Call them immediately if you see an orange light blinking on your monitor.   If your monitor falls off in less than 4 days contact our Monitor department at [phone].  If your monitor becomes loose or falls off after 4 days call [employer] at [phone] for suggestions on securing your monitor.  Your physician has requested that you have a lexiscan myoview. For further information please visit [email or web address]. Please follow instruction sheet, as given.   Follow-Up:  Your  physician recommends that you schedule a follow-up appointment in: 8 weeks  Any Other Special Instructions Will Be Listed Below (If Applicable).  If you need a refill on your cardiac medications before your next appointment, please call your pharmacy.

## 2023-01-28 NOTE — Progress Notes (Signed)
Office Visit    Patient Name: Alicia Sanchez Date of Encounter: 01/28/2023  PCP:  Alicia Sanchez  Cardiologist:  Alicia Dolly, MD  Advanced Practice Provider:  Finis Bud, NP Electrophysiologist:  None  [phone]  Chief Complaint    Alicia Sanchez is a 62 y.o. female with a hx of syncope, palpitations, history of chest pain, COPD, osteoporosis, anxiety/depression/panic attacks who presents today for past syncopal evaluation and recent BP issues.   Past Medical History    Past Medical History:  Diagnosis Date   Anxiety    COPD (chronic obstructive pulmonary disease) ([place])    Depression    History of chest pain    History of syncope    Hypotension    Osteoporosis    Osteoporosis    Palpitations    Panic attacks    Past Surgical History:  Procedure Laterality Date   ABDOMINAL HYSTERECTOMY      Allergies  Allergies  Allergen Reactions   Doxycycline     Chest pain    History of Present Illness    Alicia Sanchez is a 62 y.o. female with a past medical history as mentioned above.  Coronary CTA in 2021 revealed coronary calcium score of 45, nonobstructive mild CAD noted. History consistent with orthostatic syncope. Last seen by Dr. Carlyle Sanchez on Mar 27, 2022.  EKG showed normal sinus rhythm.  Conservative measures discussed regarding orthostatic syncope, recommended if ongoing symptoms may need to consider Florinef.  Was seen at [facility] last Saturday for COPD exacerbation.  Today she presents for follow-up.  She states her syncopal episode happened 2 weeks ago, had a fall that resulted in nasal fracture, concussion, and was diagnosed with pneumonia.  Has completed treatment for pneumonia and is seeing an ENT tomorrow.  Admits to low blood pressures, DBP averaging 80s at times, dizziness, and recent near syncopal episode. Denies any chest pain, shortness of breath, palpitations, orthopnea, PND, swelling  or significant weight changes, acute bleeding, or claudication.  Denies any recurrent syncopal episodes.  EKGs/Labs/Other Studies Reviewed:   The following studies were reviewed today:   EKG:  EKG is not ordered today.     Lexiscan on August 03, 2021:   The study is low risk.   No ST deviation was noted. The ECG was negative for ischemia.   LV perfusion is abnormal. Defect 1: There is a small defect with mild reduction in uptake present in the mid inferior location(s) that is partially reversible. There is normal wall motion in the defect area. Consistent with mild ischemia.   Left ventricular function is normal. Nuclear stress EF: 80 %.   Small, partially reversible, mid inferior wall defect suggesting mild ischemic territory with vigorous LVEF 80%.  Low risk study.  Coronary CTA on February 04, 2020: IMPRESSION: 1. Coronary artery calcium score 45 Agatston units. This places the patient in the 85th percentile for age and gender, suggesting high risk for future cardiac events.   2.  Nonobstructive mild CAD noted.  Echocardiogram on July 11, 2018: Study Conclusions   - Left ventricle: The cavity size was normal. Wall thickness was    normal. Systolic function was normal. The estimated ejection    fraction was in the range of 60% to 65%. Wall motion was normal;    there were no regional wall motion abnormalities. Left    ventricular diastolic function parameters were normal.  - Aortic valve: Mildly calcified annulus. Probably trileaflet.  -  Mitral valve: Mildly calcified annulus. There was trivial    regurgitation.  - Right ventricle: The cavity size was mildly dilated.  - Right atrium: Central venous pressure (est): 3 mm Hg.  - Atrial septum: No defect or patent foramen ovale was identified.  - Tricuspid valve: There was trivial regurgitation.  - Pericardium, extracardiac: A prominent pericardial fat pad was    present.   Recent Labs: 11/28/2022: TSH 1.690 01/11/2023:  ALT 22; BUN 13; Creatinine, Ser 1.00; Hemoglobin 14.6; Platelets 277; Potassium 4.3; Sodium 138  Recent Lipid Panel    Component Value Date/Time   CHOL 134 10/27/2021 1156   TRIG 138 10/27/2021 1156   HDL 62 10/27/2021 1156   CHOLHDL 2.2 10/27/2021 1156   LDLCALC 48 10/27/2021 1156    Risk Assessment/Calculations:   The 10-year ASCVD risk score (Arnett DK, et al., 2019) is: 5.5%   Values used to calculate the score:     Age: 35 years     Sex: Female     Is Non-Hispanic African American: No     Diabetic: No     Tobacco smoker: Yes     Systolic Blood Pressure: 123XX123 mmHg     Is BP treated: Yes     HDL Cholesterol: 62 mg/dL     Total Cholesterol: 134 mg/dL   Home Medications   Current Meds  Medication Sig   albuterol (VENTOLIN HFA) 108 (90 Base) MCG/ACT inhaler INHALE 2 PUFFS INTO THE LUNGS EVERY 6 (SIX) HOURS AS NEEDED FOR WHEEZING OR SHORTNESS OF BREATH.   alendronate (FOSAMAX) 70 MG tablet TAKE 1 TABLET EVERY 7 DAYS WITH A FULL GLASS OF WATER ON AN EMPTY STOMACH   ALPRAZolam (XANAX) 0.5 MG tablet TAKE 1 TABLET BY MOUTH THREE TIMES DAILY AND  1  EXTRA  PRN  FOR  INCREASED  ANXIETY   aspirin EC 81 MG tablet Take 81 mg by mouth daily.   atorvastatin (LIPITOR) 40 MG tablet TAKE 1 TABLET EVERY DAY (NEED MD APPOINTMENT FOR REFILLS)   Budeson-Glycopyrrol-Formoterol (BREZTRI AEROSPHERE) 160-9-4.8 MCG/ACT AERO Inhale 2 puffs into the lungs 2 (two) times daily. Please fill #3 inahlers for 3 month supply   busPIRone (BUSPAR) 30 MG tablet TAKE 1 TABLET TWICE DAILY   DULoxetine (CYMBALTA) 60 MG capsule TAKE 2 CAPSULES (120 MG TOTAL) EVERY DAY   hydrOXYzine (ATARAX) 50 MG tablet Take one to two tablets at bedtime as needed for sleep.   ipratropium-albuterol (DUONEB) 0.5-2.5 (3) MG/3ML SOLN Take 3 mLs by nebulization every 6 (six) hours as needed.   meloxicam (MOBIC) 7.5 MG tablet Take 1 tablet (7.5 mg total) by mouth daily.   midodrine (PROAMATINE) 2.5 MG tablet Take 1 tablet (2.5 mg total)  by mouth 3 (three) times daily as needed (blood pressure less than 90/60).   predniSONE (DELTASONE) 20 MG tablet 2 po at same time daily for 5 days   rOPINIRole (REQUIP) 1 MG tablet Take 1-4 tablets (1-4 mg total) by mouth at bedtime.   nitroGLYCERIN (NITROSTAT) 0.4 MG SL tablet Place 1 tablet (0.4 mg total) under the tongue every 5 (five) minutes x 3 doses as needed for chest pain (if o relief after 2nd dose, proceed to the ED for an evaluation or call 911).     Review of Systems    All other systems reviewed and are otherwise negative except as noted above.  Physical Exam    VS:  BP 108/77 (BP Location: Left Arm, Cuff Size: Normal)   Pulse Marland Kitchen)  101   Ht 5' 4.5" (1.638 m)   Wt 117 lb (53.1 kg)   SpO2 93%   BMI 19.77 kg/m  , BMI Body mass index is 19.77 kg/m.  Wt Readings from Last 3 Encounters:  01/28/23 117 lb (53.1 kg)  01/16/23 116 lb (52.6 kg)  01/11/23 115 lb 9.6 oz (52.4 kg)     GEN: Well nourished, well developed, in no acute distress. HEENT: normal. Neck: Supple, no JVD, carotid bruits, or masses. Cardiac: S1/S2, RRR, no murmurs, rubs, or gallops. No clubbing, cyanosis, edema.  Radials/PT 2+ and equal bilaterally.  Respiratory:  Respirations regular and unlabored, diminished inspiratory wheezing to auscultation along right posterior lung fields, diminished/clear on left side. MS: No deformity or atrophy. Skin: Warm and dry, no rash. Neuro:  Strength and sensation are intact. Tremors to bilateral arms  Psych: Normal affect.  Assessment & Plan    Syncope, near syncope, dizziness, low BP Etiology not clear.  Was told in the past to be orthostatic in nature.  Negative orthostatics on exam today.  I believe her past history of low BP readings at home are contributing to her symptoms. Will arrange the following workup to be completed: Echocardiogram, 2-week live ZIO monitor, and Lexiscan stress test.  Discussed risk versus benefits of Lexiscan stress test, and she verbalized  understanding and is agreeable to proceed. Will start Midodrine 2.5 mg TID PRN for BP < 90/60. Will obtain the following labs: CBC with differential, CMET, Mag, and Thyroid panel.   Shared Decision Making/Informed Consent The risks [chest pain, shortness of breath, cardiac arrhythmias, dizziness, blood pressure fluctuations, myocardial infarction, stroke/transient ischemic attack, nausea, vomiting, allergic reaction, radiation exposure, metallic taste sensation and life-threatening complications (estimated to be 1 in 10,000)], benefits (risk stratification, diagnosing coronary artery disease, treatment guidance) and alternatives of a nuclear stress test were discussed in detail with Ms. Adan and she agrees to proceed.  2. COPD tobacco abuse Recently tx for PNA, currently on prednisone. Continues to smoke. Smoking cessation encouraged and discussed. Continue to follow-up with Pulmonologist.   Disposition: Follow up in 6-8 week(s) with Alicia Dolly, MD or APP.  Signed, Alicia Bud, NP 01/28/2023, 3:19 PM [facility]

## 2023-01-28 NOTE — Telephone Encounter (Signed)
Checking percert on the following patient for testing scheduled at [facility].   LEXISCAN 02/12/2023    14 day ZIO ZT dx: syncope

## 2023-01-29 ENCOUNTER — Ambulatory Visit: Payer: Medicare PPO | Admitting: Nurse Practitioner

## 2023-01-29 DIAGNOSIS — R0982 Postnasal drip: Secondary | ICD-10-CM | POA: Diagnosis not present

## 2023-01-29 DIAGNOSIS — J029 Acute pharyngitis, unspecified: Secondary | ICD-10-CM | POA: Diagnosis not present

## 2023-01-30 DIAGNOSIS — R55 Syncope and collapse: Secondary | ICD-10-CM | POA: Diagnosis not present

## 2023-02-01 ENCOUNTER — Ambulatory Visit: Payer: Medicare PPO | Admitting: Nurse Practitioner

## 2023-02-04 ENCOUNTER — Telehealth: Payer: Self-pay | Admitting: Cardiology

## 2023-02-04 ENCOUNTER — Ambulatory Visit (INDEPENDENT_AMBULATORY_CARE_PROVIDER_SITE_OTHER): Payer: Medicare HMO

## 2023-02-04 ENCOUNTER — Ambulatory Visit (INDEPENDENT_AMBULATORY_CARE_PROVIDER_SITE_OTHER): Payer: Medicare HMO | Admitting: Family Medicine

## 2023-02-04 ENCOUNTER — Encounter: Payer: Self-pay | Admitting: Family Medicine

## 2023-02-04 VITALS — BP 120/77 | HR 135 | Temp 97.8°F | Ht 64.5 in | Wt 112.5 lb

## 2023-02-04 DIAGNOSIS — J449 Chronic obstructive pulmonary disease, unspecified: Secondary | ICD-10-CM | POA: Diagnosis not present

## 2023-02-04 DIAGNOSIS — R55 Syncope and collapse: Secondary | ICD-10-CM | POA: Diagnosis not present

## 2023-02-04 DIAGNOSIS — R509 Fever, unspecified: Secondary | ICD-10-CM | POA: Diagnosis not present

## 2023-02-04 DIAGNOSIS — Z79899 Other long term (current) drug therapy: Secondary | ICD-10-CM

## 2023-02-04 DIAGNOSIS — J441 Chronic obstructive pulmonary disease with (acute) exacerbation: Secondary | ICD-10-CM

## 2023-02-04 DIAGNOSIS — J029 Acute pharyngitis, unspecified: Secondary | ICD-10-CM | POA: Diagnosis not present

## 2023-02-04 MED ORDER — AMOXICILLIN-POT CLAVULANATE 875-125 MG PO TABS: 1.0000 | ORAL_TABLET | Freq: Two times a day (BID) | ORAL | 0 refills | Status: AC

## 2023-02-04 MED ORDER — PREDNISONE 20 MG PO TABS: 20.0000 mg | ORAL_TABLET | Freq: Every day | ORAL | 0 refills | Status: AC

## 2023-02-04 NOTE — Addendum Note (Signed)
Addended by: Guerry Bruin on: 02/04/2023 02:51 PM   Modules accepted: Orders

## 2023-02-04 NOTE — Progress Notes (Signed)
Established Patient Office Visit  Subjective   Patient ID: Alicia Sanchez, female    DOB: November 25, 1960  Age: 62 y.o. MRN: [id number]  Chief Complaint  Patient presents with   COPD    HPI Alicia Sanchez is here with her daughter today for a ER follow up. She was seen in the ER on 01/26/23 at [facility] for increased cough, wheezing, and shortness of breath. She has clear lung sounds without increased work of breath on exam. She was diagnosed with a COPD exacerbation and was sent home with a short burst of prednisone and a refill of her albuterol inhaler. She had a negative respiratory panel.   She reports that she continues to have an increased productive cough with wheezing and shortness of breath. Symptoms have been gradually worsening since discharge. She has been using her breztri as prescribed and has done duonebs daily. She wears oxygen prn at home. She is established with pulmonology. She recently had pneumonia.   She also reports sore throat for 10 days. She is also feeling nauseous and lightheaded. She also reports headache, fatigue, body aches, and vomiting. She reports 1-2 episodes of vomiting a day. NBNB. Denies abdominal pain. Fever for last 2 days with temp of 100.7 this morning with chills and sweats. She saw ENT yesterday. Reports that they were supposed to send in an antibiotic, but did not. She isn't sure what for.    She has been seeing cardiology for syncope and tachycardia and has a live zio on now. Cardiology has ordered labs for her to have done today.    Past Medical History:  Diagnosis Date   Anxiety    Aortic atherosclerosis ([facility])    COPD (chronic obstructive pulmonary disease) ([place])    Depression    History of chest pain    History of syncope    Hypotension    Osteoporosis    Palpitations    Panic attacks       ROS As per HPI.    Objective:     BP 120/77   Pulse (!) 135   Temp 97.8 F (36.6 C) (Temporal)   Ht 5' 4.5" (1.638 m)   Wt 112 lb 8 oz (51 kg)   SpO2  92%   BMI 19.01 kg/m  BP Readings from Last 3 Encounters:  02/04/23 120/77  01/28/23 108/77  01/16/23 115/72   Physical Exam Vitals and nursing note reviewed.  Constitutional:      General: She is not in acute distress.    Appearance: She is not toxic-appearing or diaphoretic.  HENT:     Head: Normocephalic and atraumatic.     Right Ear: Tympanic membrane, ear canal and external ear normal.     Left Ear: Tympanic membrane, ear canal and external ear normal.     Nose: Congestion present.     Mouth/Throat:     Mouth: Mucous membranes are moist.     Pharynx: Posterior oropharyngeal erythema present.     Tonsils: No tonsillar exudate or tonsillar abscesses. 1+ on the right. 1+ on the left.  Eyes:     General:        Right eye: No discharge.        Left eye: No discharge.     Extraocular Movements: Extraocular movements intact.     Conjunctiva/sclera: Conjunctivae normal.  Cardiovascular:     Rate and Rhythm: Regular rhythm. Tachycardia present.     Heart sounds: Normal heart sounds. No murmur heard. Pulmonary:  Effort: Pulmonary effort is normal. No respiratory distress.     Breath sounds: Examination of the right-upper field reveals rhonchi. Examination of the left-upper field reveals rhonchi. Examination of the right-middle field reveals wheezing and rhonchi. Examination of the left-middle field reveals wheezing and rhonchi. Examination of the right-lower field reveals wheezing and rhonchi. Examination of the left-lower field reveals wheezing and rhonchi. Wheezing and rhonchi present. No rales.  Abdominal:     General: Bowel sounds are normal. There is no distension.     Palpations: Abdomen is soft.     Tenderness: There is no abdominal tenderness. There is no guarding or rebound.  Musculoskeletal:     Cervical back: Neck supple. No rigidity.     Right lower leg: No edema.     Left lower leg: No edema.  Lymphadenopathy:     Cervical: No cervical adenopathy.  Skin:     General: Skin is warm and dry.  Neurological:     General: No focal deficit present.     Mental Status: She is alert and oriented to person, place, and time.  Psychiatric:        Mood and Affect: Mood normal.        Behavior: Behavior normal.      No results found for any visits on 02/04/23.    The 10-year ASCVD risk score (Arnett DK, et al., 2019) is: 6.8%    Assessment & Plan:   Alicia Sanchez was seen today for copd.  Diagnoses and all orders for this visit:  Fever, unspecified fever cause -     DG Chest 2 View; Future -     amoxicillin-clavulanate (AUGMENTIN) 875-125 MG tablet; Take 1 tablet by mouth 2 (two) times daily for 7 days. -     predniSONE (DELTASONE) 20 MG tablet; Take 1 tablet (20 mg total) by mouth daily with breakfast for 5 days.  Sore throat  COPD with acute exacerbation ([place]) -     DG Chest 2 View; Future -     predniSONE (DELTASONE) 20 MG tablet; Take 1 tablet (20 mg total) by mouth daily with breakfast for 5 days.  Syncope, unspecified syncope type Labs ordered by cardiology. -     Thyroid Profile -     Magnesium -     Comprehensive metabolic panel -     CBC with Differential  Medication management Labs ordered by cardiology.  -     Thyroid Profile -     Magnesium -     Comprehensive metabolic panel -     CBC with Differential  CXR today without acute process. Prednisone burst ordered. Continue current COPD regimen. Sore throat with cough, congestion x 10 days with fever x 2 day. Will cover empirically with Augmentin for bacterial etiology.  Continue follow up with cardiology as schedule. Strict return precautions given.   The patient indicates understanding of these issues and agrees with the plan.  Gwenlyn Perking, FNP

## 2023-02-04 NOTE — Telephone Encounter (Signed)
Pt called stating that her heart monitor is about to fall off and may need help to get it stay on.

## 2023-02-04 NOTE — Telephone Encounter (Signed)
Says she has misplaced gateway for ZIO AT device. Also says her patch is loose. Advised to contact [employer] [phone]) directly with these issues so that she can get replacement equipment. Verbalized understanding

## 2023-02-05 LAB — CBC WITH DIFFERENTIAL/PLATELET
Basophils Absolute: 0.1 10*3/uL (ref 0.0–0.2)
Basos: 0 %
EOS (ABSOLUTE): 0 10*3/uL (ref 0.0–0.4)
Eos: 0 %
Hematocrit: 49.3 % — ABNORMAL HIGH (ref 34.0–46.6)
Hemoglobin: 16.6 g/dL — ABNORMAL HIGH (ref 11.1–15.9)
Immature Grans (Abs): 0.1 10*3/uL (ref 0.0–0.1)
Immature Granulocytes: 1 %
Lymphocytes Absolute: 2.8 10*3/uL (ref 0.7–3.1)
Lymphs: 13 %
MCH: 30.6 pg (ref 26.6–33.0)
MCHC: 33.7 g/dL (ref 31.5–35.7)
MCV: 91 fL (ref 79–97)
Monocytes Absolute: 1.8 10*3/uL — ABNORMAL HIGH (ref 0.1–0.9)
Monocytes: 8 %
Neutrophils Absolute: 17.9 10*3/uL — ABNORMAL HIGH (ref 1.4–7.0)
Neutrophils: 78 %
Platelets: 391 10*3/uL (ref 150–450)
RBC: 5.42 x10E6/uL — ABNORMAL HIGH (ref 3.77–5.28)
RDW: 12.7 % (ref 11.7–15.4)
WBC: 22.8 10*3/uL (ref 3.4–10.8)

## 2023-02-05 LAB — COMPREHENSIVE METABOLIC PANEL
ALT: 29 IU/L (ref 0–32)
AST: 40 IU/L (ref 0–40)
Albumin/Globulin Ratio: 2 (ref 1.2–2.2)
Albumin: 4.6 g/dL (ref 3.9–4.9)
Alkaline Phosphatase: 91 IU/L (ref 44–121)
BUN/Creatinine Ratio: 20 (ref 12–28)
BUN: 18 mg/dL (ref 8–27)
Bilirubin Total: 0.7 mg/dL (ref 0.0–1.2)
CO2: 22 mmol/L (ref 20–29)
Calcium: 9.6 mg/dL (ref 8.7–10.3)
Chloride: 99 mmol/L (ref 96–106)
Creatinine, Ser: 0.89 mg/dL (ref 0.57–1.00)
Globulin, Total: 2.3 g/dL (ref 1.5–4.5)
Glucose: 100 mg/dL — ABNORMAL HIGH (ref 70–99)
Potassium: 4.3 mmol/L (ref 3.5–5.2)
Sodium: 140 mmol/L (ref 134–144)
Total Protein: 6.9 g/dL (ref 6.0–8.5)
eGFR: 73 mL/min/{1.73_m2} (ref >59)

## 2023-02-05 LAB — MAGNESIUM: Magnesium: 2.1 mg/dL (ref 1.6–2.3)

## 2023-02-05 LAB — THYROID PANEL
Free Thyroxine Index: 2.1 (ref 1.2–4.9)
T3 Uptake Ratio: 28 % (ref 24–39)
T4, Total: 7.6 ug/dL (ref 4.5–12.0)

## 2023-02-06 ENCOUNTER — Ambulatory Visit: Payer: Medicare PPO | Admitting: Gastroenterology

## 2023-02-06 NOTE — Telephone Encounter (Signed)
Received notification from [employer] regarding approval for [place]. Patient assistance approved from 01/25/23 to 01/25/24.  Phone: [phone]

## 2023-02-07 ENCOUNTER — Other Ambulatory Visit: Payer: Self-pay | Admitting: Family Medicine

## 2023-02-07 DIAGNOSIS — M1990 Unspecified osteoarthritis, unspecified site: Secondary | ICD-10-CM

## 2023-02-08 ENCOUNTER — Encounter: Payer: Self-pay | Admitting: Family Medicine

## 2023-02-12 ENCOUNTER — Encounter (HOSPITAL_COMMUNITY)
Admission: RE | Admit: 2023-02-12 | Discharge: 2023-02-12 | Disposition: A | Discharge status: Home / Self Care | Payer: Medicare HMO | Source: Ambulatory Visit | Attending: Nurse Practitioner | Admitting: Nurse Practitioner

## 2023-02-12 ENCOUNTER — Ambulatory Visit (HOSPITAL_COMMUNITY)
Admission: RE | Admit: 2023-02-12 | Discharge: 2023-02-12 | Disposition: A | Discharge status: Home / Self Care | Payer: Medicare HMO | Source: Ambulatory Visit | Attending: Cardiology | Admitting: Cardiology

## 2023-02-12 DIAGNOSIS — R55 Syncope and collapse: Secondary | ICD-10-CM | POA: Diagnosis not present

## 2023-02-12 DIAGNOSIS — Z79899 Other long term (current) drug therapy: Secondary | ICD-10-CM | POA: Insufficient documentation

## 2023-02-12 LAB — NM MYOCAR MULTI W/SPECT W/WALL MOTION / EF
LV dias vol: 35 mL (ref 46–106)
LV sys vol: 5 mL
Nuc Stress EF: 85 %
Peak HR: 111 {beats}/min
RATE: 0.3
Rest HR: 82 {beats}/min
Rest Nuclear Isotope Dose: 10 mCi
SDS: 0
SRS: 3
SSS: 3
ST Depression (mm): 0 mm
Stress Nuclear Isotope Dose: 29.5 mCi
TID: 1.13

## 2023-02-12 MED ORDER — TECHNETIUM TC 99M TETROFOSMIN IV KIT
29.5000 | PACK | Freq: Once | INTRAVENOUS | Status: AC | PRN
  Administered 2023-02-12: 29.5 via INTRAVENOUS

## 2023-02-12 MED ORDER — REGADENOSON 0.4 MG/5ML IV SOLN
INTRAVENOUS | Status: AC
  Administered 2023-02-12: 0.4 mg via INTRAVENOUS
  Filled 2023-02-12: qty 5

## 2023-02-12 MED ORDER — TECHNETIUM TC 99M TETROFOSMIN IV KIT
10.0000 | PACK | Freq: Once | INTRAVENOUS | Status: AC | PRN
  Administered 2023-02-12: 10 via INTRAVENOUS

## 2023-02-12 MED ORDER — SODIUM CHLORIDE FLUSH 0.9 % IV SOLN
INTRAVENOUS | Status: AC
  Administered 2023-02-12: 10 mL via INTRAVENOUS
  Filled 2023-02-12: qty 10

## 2023-02-13 ENCOUNTER — Other Ambulatory Visit: Payer: Self-pay | Admitting: Acute Care

## 2023-02-13 DIAGNOSIS — Z122 Encounter for screening for malignant neoplasm of respiratory organs: Secondary | ICD-10-CM

## 2023-02-13 DIAGNOSIS — Z87891 Personal history of nicotine dependence: Secondary | ICD-10-CM

## 2023-02-13 DIAGNOSIS — F17210 Nicotine dependence, cigarettes, uncomplicated: Secondary | ICD-10-CM

## 2023-02-18 ENCOUNTER — Ambulatory Visit: Payer: Medicare HMO | Attending: Nurse Practitioner

## 2023-02-18 DIAGNOSIS — Z79899 Other long term (current) drug therapy: Secondary | ICD-10-CM | POA: Diagnosis not present

## 2023-02-18 DIAGNOSIS — R55 Syncope and collapse: Secondary | ICD-10-CM | POA: Diagnosis not present

## 2023-02-19 LAB — ECHOCARDIOGRAM COMPLETE
AR max vel: 2.19 cm2
AV Area VTI: 2.13 cm2
AV Area mean vel: 2.52 cm2
AV Mean grad: 2.5 mmHg
AV Peak grad: 5.7 mmHg
Ao pk vel: 1.19 m/s
Area-P 1/2: 4.57 cm2
Calc EF: 74.2 %
Est EF: >75
S' Lateral: 1.5 cm
Single Plane A2C EF: 75.2 %
Single Plane A4C EF: 73.1 %

## 2023-02-28 ENCOUNTER — Other Ambulatory Visit: Payer: Self-pay | Admitting: Family Medicine

## 2023-02-28 ENCOUNTER — Other Ambulatory Visit: Payer: Self-pay | Admitting: Pulmonary Disease

## 2023-02-28 ENCOUNTER — Other Ambulatory Visit: Payer: Self-pay | Admitting: Adult Health

## 2023-02-28 DIAGNOSIS — G4700 Insomnia, unspecified: Secondary | ICD-10-CM

## 2023-02-28 DIAGNOSIS — F410 Panic disorder [episodic paroxysmal anxiety] without agoraphobia: Secondary | ICD-10-CM

## 2023-02-28 DIAGNOSIS — F411 Generalized anxiety disorder: Secondary | ICD-10-CM

## 2023-02-28 DIAGNOSIS — G2581 Restless legs syndrome: Secondary | ICD-10-CM

## 2023-03-11 ENCOUNTER — Emergency Department (HOSPITAL_COMMUNITY): Payer: Medicare HMO

## 2023-03-11 ENCOUNTER — Encounter (HOSPITAL_BASED_OUTPATIENT_CLINIC_OR_DEPARTMENT_OTHER): Payer: Self-pay | Admitting: Pulmonary Disease

## 2023-03-11 ENCOUNTER — Inpatient Hospital Stay (HOSPITAL_COMMUNITY)
Admission: EM | Admit: 2023-03-11 | Discharge: 2023-03-14 | DRG: 190 | Disposition: A | Discharge status: Home / Self Care | Payer: Medicare HMO | Attending: Family Medicine | Admitting: Family Medicine

## 2023-03-11 ENCOUNTER — Other Ambulatory Visit: Payer: Self-pay

## 2023-03-11 ENCOUNTER — Encounter (HOSPITAL_COMMUNITY): Payer: Self-pay

## 2023-03-11 ENCOUNTER — Ambulatory Visit (HOSPITAL_BASED_OUTPATIENT_CLINIC_OR_DEPARTMENT_OTHER): Payer: Medicare HMO

## 2023-03-11 ENCOUNTER — Ambulatory Visit (INDEPENDENT_AMBULATORY_CARE_PROVIDER_SITE_OTHER): Payer: Medicare HMO | Admitting: Pulmonary Disease

## 2023-03-11 VITALS — BP 106/78 | HR 137 | Temp 97.9°F | Ht 64.0 in | Wt 111.4 lb

## 2023-03-11 DIAGNOSIS — J441 Chronic obstructive pulmonary disease with (acute) exacerbation: Secondary | ICD-10-CM

## 2023-03-11 DIAGNOSIS — I700 Atherosclerosis of aorta: Secondary | ICD-10-CM | POA: Diagnosis present

## 2023-03-11 DIAGNOSIS — R0789 Other chest pain: Secondary | ICD-10-CM

## 2023-03-11 DIAGNOSIS — Z720 Tobacco use: Secondary | ICD-10-CM | POA: Diagnosis present

## 2023-03-11 DIAGNOSIS — Z791 Long term (current) use of non-steroidal anti-inflammatories (NSAID): Secondary | ICD-10-CM | POA: Diagnosis not present

## 2023-03-11 DIAGNOSIS — J209 Acute bronchitis, unspecified: Secondary | ICD-10-CM

## 2023-03-11 DIAGNOSIS — G2581 Restless legs syndrome: Secondary | ICD-10-CM | POA: Diagnosis not present

## 2023-03-11 DIAGNOSIS — F32A Depression, unspecified: Secondary | ICD-10-CM | POA: Diagnosis present

## 2023-03-11 DIAGNOSIS — F17210 Nicotine dependence, cigarettes, uncomplicated: Secondary | ICD-10-CM | POA: Diagnosis not present

## 2023-03-11 DIAGNOSIS — R231 Pallor: Secondary | ICD-10-CM | POA: Diagnosis not present

## 2023-03-11 DIAGNOSIS — I959 Hypotension, unspecified: Secondary | ICD-10-CM | POA: Diagnosis not present

## 2023-03-11 DIAGNOSIS — Z716 Tobacco abuse counseling: Secondary | ICD-10-CM | POA: Diagnosis not present

## 2023-03-11 DIAGNOSIS — Z881 Allergy status to other antibiotic agents status: Secondary | ICD-10-CM

## 2023-03-11 DIAGNOSIS — Z7983 Long term (current) use of bisphosphonates: Secondary | ICD-10-CM

## 2023-03-11 DIAGNOSIS — F410 Panic disorder [episodic paroxysmal anxiety] without agoraphobia: Secondary | ICD-10-CM | POA: Diagnosis present

## 2023-03-11 DIAGNOSIS — Z841 Family history of disorders of kidney and ureter: Secondary | ICD-10-CM | POA: Diagnosis not present

## 2023-03-11 DIAGNOSIS — R069 Unspecified abnormalities of breathing: Secondary | ICD-10-CM | POA: Diagnosis not present

## 2023-03-11 DIAGNOSIS — Z79899 Other long term (current) drug therapy: Secondary | ICD-10-CM

## 2023-03-11 DIAGNOSIS — E782 Mixed hyperlipidemia: Secondary | ICD-10-CM | POA: Insufficient documentation

## 2023-03-11 DIAGNOSIS — Z825 Family history of asthma and other chronic lower respiratory diseases: Secondary | ICD-10-CM

## 2023-03-11 DIAGNOSIS — Z90710 Acquired absence of both cervix and uterus: Secondary | ICD-10-CM | POA: Diagnosis not present

## 2023-03-11 DIAGNOSIS — Z7982 Long term (current) use of aspirin: Secondary | ICD-10-CM

## 2023-03-11 DIAGNOSIS — J189 Pneumonia, unspecified organism: Secondary | ICD-10-CM | POA: Diagnosis not present

## 2023-03-11 DIAGNOSIS — M810 Age-related osteoporosis without current pathological fracture: Secondary | ICD-10-CM | POA: Diagnosis present

## 2023-03-11 DIAGNOSIS — Z833 Family history of diabetes mellitus: Secondary | ICD-10-CM | POA: Diagnosis not present

## 2023-03-11 DIAGNOSIS — J9621 Acute and chronic respiratory failure with hypoxia: Secondary | ICD-10-CM | POA: Diagnosis not present

## 2023-03-11 DIAGNOSIS — J440 Chronic obstructive pulmonary disease with acute lower respiratory infection: Secondary | ICD-10-CM | POA: Diagnosis present

## 2023-03-11 DIAGNOSIS — R0902 Hypoxemia: Secondary | ICD-10-CM | POA: Diagnosis not present

## 2023-03-11 DIAGNOSIS — R079 Chest pain, unspecified: Secondary | ICD-10-CM | POA: Diagnosis not present

## 2023-03-11 DIAGNOSIS — R0602 Shortness of breath: Secondary | ICD-10-CM | POA: Diagnosis not present

## 2023-03-11 HISTORY — DX: Restless legs syndrome: G25.81

## 2023-03-11 LAB — CBC
HCT: 43.7 % (ref 36.0–46.0)
Hemoglobin: 15.1 g/dL — ABNORMAL HIGH (ref 12.0–15.0)
MCH: 31.3 pg (ref 26.0–34.0)
MCHC: 34.6 g/dL (ref 30.0–36.0)
MCV: 90.7 fL (ref 80.0–100.0)
Platelets: 317 10*3/uL (ref 150–400)
RBC: 4.82 MIL/uL (ref 3.87–5.11)
RDW: 13.1 % (ref 11.5–15.5)
WBC: 25.3 10*3/uL — ABNORMAL HIGH (ref 4.0–10.5)
nRBC: 0 % (ref 0.0–0.2)

## 2023-03-11 LAB — BASIC METABOLIC PANEL
Anion gap: 11 (ref 5–15)
BUN: 13 mg/dL (ref 8–23)
CO2: 24 mmol/L (ref 22–32)
Calcium: 9 mg/dL (ref 8.9–10.3)
Chloride: 98 mmol/L (ref 98–111)
Creatinine, Ser: 0.76 mg/dL (ref 0.44–1.00)
GFR, Estimated: >60 mL/min (ref >60)
Glucose, Bld: 143 mg/dL — ABNORMAL HIGH (ref 70–99)
Potassium: 4 mmol/L (ref 3.5–5.1)
Sodium: 133 mmol/L — ABNORMAL LOW (ref 135–145)

## 2023-03-11 LAB — TROPONIN I (HIGH SENSITIVITY): Troponin I (High Sensitivity): 13 ng/L (ref <18)

## 2023-03-11 MED ORDER — ALBUTEROL SULFATE (2.5 MG/3ML) 0.083% IN NEBU
2.5000 mg | INHALATION_SOLUTION | Freq: Once | RESPIRATORY_TRACT | Status: AC
  Administered 2023-03-11: 2.5 mg via RESPIRATORY_TRACT
  Filled 2023-03-11: qty 3

## 2023-03-11 MED ORDER — ALBUTEROL SULFATE HFA 108 (90 BASE) MCG/ACT IN AERS: 2.0000 | INHALATION_SPRAY | RESPIRATORY_TRACT | Status: DC | PRN

## 2023-03-11 MED ORDER — IPRATROPIUM-ALBUTEROL 0.5-2.5 (3) MG/3ML IN SOLN
3.0000 mL | Freq: Once | RESPIRATORY_TRACT | Status: AC
  Administered 2023-03-11: 3 mL via RESPIRATORY_TRACT
  Filled 2023-03-11: qty 3

## 2023-03-11 MED ORDER — PREDNISONE 10 MG PO TABS: ORAL_TABLET | ORAL | 0 refills | Status: DC

## 2023-03-11 MED ORDER — SODIUM CHLORIDE 0.9 % IV BOLUS
1000.0000 mL | Freq: Once | INTRAVENOUS | Status: AC
  Administered 2023-03-11: 1000 mL via INTRAVENOUS

## 2023-03-11 MED ORDER — MAGNESIUM SULFATE 2 GM/50ML IV SOLN
2.0000 g | Freq: Once | INTRAVENOUS | Status: AC
  Administered 2023-03-11: 2 g via INTRAVENOUS
  Filled 2023-03-11: qty 50

## 2023-03-11 MED ORDER — LEVOFLOXACIN 500 MG PO TABS: 500.0000 mg | ORAL_TABLET | Freq: Every day | ORAL | 0 refills | Status: DC

## 2023-03-11 NOTE — Progress Notes (Signed)
[facility]  Name: Alicia Sanchez MRN: [id number] DOB: [birth year]  Chief Complaint  Patient presents with   Follow-up    Follow up. Chest pain when breathing and coughing. Chest pain getting worse.     Summary: 62 yo female smoker with COPD followed by Dr. Isaiah Serge.  Subjective: She developed increased cough and shortness of breath 3 days ago.  She is bringing up green sputum.  Has wheezing and feels nauseous at times.  Has runny nose.  Not having sore throat.  Her Rt chest hurts when she coughs or takes a deep breath.  She has felt warm and gets chills.  She hasn't check her temperature at home.  No sick exposures.  Using albuterol and her nebulizer frequently - helps, but doesn't last.  Denies diarrhea, leg swelling, or hemoptysis.  Past medical history: She  has a past medical history of Anxiety, Aortic atherosclerosis, COPD (chronic obstructive pulmonary disease), Depression, History of chest pain, History of syncope, Hypotension, Osteoporosis, Palpitations, and Panic attacks.  Vital sign: BP 106/78 (BP Location: Right Arm, Patient Position: Sitting, Cuff Size: Normal)   Pulse (!) 137   Temp 97.9 F (36.6 C) (Oral)   Ht  (1.626 m)   Wt 111 lb 6.4 oz (50.5 kg)   SpO2 96%   BMI 19.12 kg/m   Physical exam:  Appearance - well kempt   ENMT - no sinus tenderness, no oral exudate, no LAN, Mallampati 2 airway, no stridor, deviated septum  Respiratory - bilateral expiratory wheezing, tender on palpation over Rt anterior chest area  CV - s1s2 regular rate and rhythm, no murmurs  Ext - no clubbing, no edema  Skin - no rashes  Psych - normal mood and affect  Assessment/plan:  COPD exacerbation with Rt sided chest pain. - chest xray today - will give course of prednisone and levaquin - continue breztri - prn albuterol or duoneb   Patient Instructions  Chest xray today  Prednisone 10 mg pill >> 3 pills daily for 2 days, 2 pills daily for 2  days, 1 pill daily for 2 days  Levaquin 500 mg pill daily for 7 days  Follow up with Dr. Isaiah Serge in one month as previously scheduled  Allergies as of 03/11/2023       Reactions   Doxycycline    Chest pain        Medication List        Accurate as of March 11, 2023 11:32 AM. If you have any questions, ask your nurse or doctor.          STOP taking these medications    calcium carbonate 1250 (500 Ca) MG chewable tablet Commonly known as: OS-CAL Stopped by: Coralyn Helling, MD   cetirizine 10 MG tablet Commonly known as: ZYRTEC Stopped by: Coralyn Helling, MD   FOLIC ACID PO Stopped by: Coralyn Helling, MD       TAKE these medications    albuterol 108 (90 Base) MCG/ACT inhaler Commonly known as: VENTOLIN HFA INHALE 2 PUFFS INTO THE LUNGS EVERY 6 (SIX) HOURS AS NEEDED FOR WHEEZING OR SHORTNESS OF BREATH.   alendronate 70 MG tablet Commonly known as: FOSAMAX TAKE 1 TABLET EVERY 7 DAYS WITH A FULL GLASS OF WATER ON AN EMPTY STOMACH   ALPRAZolam 0.5 MG tablet Commonly known as: XANAX TAKE 1 TABLET BY MOUTH THREE TIMES DAILY AND  1  EXTRA  PRN  FOR  INCREASED  ANXIETY   aspirin EC 81 MG  tablet Take 81 mg by mouth daily.   atorvastatin 40 MG tablet Commonly known as: LIPITOR TAKE 1 TABLET EVERY DAY (NEED MD APPOINTMENT FOR REFILLS)   Breztri Aerosphere 160-9-4.8 MCG/ACT Aero Generic drug: Budeson-Glycopyrrol-Formoterol Inhale 2 puffs into the lungs 2 (two) times daily. Please fill #3 inahlers for 3 month supply   busPIRone 30 MG tablet Commonly known as: BUSPAR TAKE 1 TABLET TWICE DAILY   DULoxetine 60 MG capsule Commonly known as: CYMBALTA TAKE 2 CAPSULES (120 MG TOTAL) EVERY DAY   hydrOXYzine 50 MG tablet Commonly known as: ATARAX Take one to two tablets at bedtime as needed for sleep.   ipratropium-albuterol 0.5-2.5 (3) MG/3ML Soln Commonly known as: DUONEB INHALE THE CONTENTS OF 1 VIAL VIA NEBULIZER EVERY 6 HOURS AS NEEDED   levofloxacin 500 MG  tablet Commonly known as: LEVAQUIN Take 1 tablet (500 mg total) by mouth daily. Started by: Coralyn Helling, MD   meloxicam 7.5 MG tablet Commonly known as: MOBIC Take 1 tablet (7.5 mg total) by mouth daily.   midodrine 2.5 MG tablet Commonly known as: PROAMATINE Take 1 tablet (2.5 mg total) by mouth 3 (three) times daily as needed (blood pressure less than 90/60).   Nebulizer/Tubing/Mouthpiece Kit 1 Units by Does not apply route 4 (four) times daily. GIVE face mask and tubing for nebulizer. J44.1   nitroGLYCERIN 0.4 MG SL tablet Commonly known as: NITROSTAT Place 1 tablet (0.4 mg total) under the tongue every 5 (five) minutes x 3 doses as needed for chest pain (if o relief after 2nd dose, proceed to the ED for an evaluation or call 911).   predniSONE 10 MG tablet Commonly known as: DELTASONE Take 3 tablets (30 mg total) by mouth daily with breakfast for 2 days, THEN 2 tablets (20 mg total) daily with breakfast for 2 days, THEN 1 tablet (10 mg total) daily with breakfast for 2 days. Start taking on: March 11, 2023 Started by: Coralyn Helling, MD   rOPINIRole 1 MG tablet Commonly known as: REQUIP TAKE 1-4 TABLETS BY MOUTH AT BEDTIME.        Time spent: 37 minutes  Signature: Coralyn Helling, MD [facility] Pager - [phone] 03/11/2023, 11:32 AM

## 2023-03-11 NOTE — ED Notes (Signed)
Pt given water at this time 

## 2023-03-11 NOTE — Patient Instructions (Signed)
Chest xray today  Prednisone 10 mg pill >> 3 pills daily for 2 days, 2 pills daily for 2 days, 1 pill daily for 2 days  Levaquin 500 mg pill daily for 7 days  Follow up with Dr. Isaiah Serge in one month as previously scheduled

## 2023-03-11 NOTE — H&P (Incomplete)
History and Physical    Patient: Alicia Sanchez ZOX:[id number] DOB: 1960-11-27 DOA: 03/11/2023 DOS: the patient was seen and examined on 03/11/2023 PCP: Alicia Ip, DO  Patient coming from: Home  Chief Complaint:  Chief Complaint  Patient presents with  . Shortness of Breath   HPI: Alicia Sanchez is a 62 y.o. female with medical history significant of COPD on 2 LPM of oxygen as needed at baseline, tobacco abuse, hyperlipidemia, anxiety who presents to the emergency department via EMS due to shortness of breath.  Patient saw her pulmonologist this morning due to 3-day onset of increased cough with production of greenish sputum, runny nose, occasional nausea and shortness of breath with associated right-sided chest pain when she takes deep breath or coughs.  Home albuterol and nebulizer only provided minimal relief.  She has been dependent more on oxygen than baseline need.  ED Course:  In the emergency department, she was tachypneic and tachycardic, BP was 112/73, O2 sat was 95% on supplemental oxygen via [place] 2 LPM.  Other vital signs were within normal range.  Workup in the ED showed normal CBC except for WBC of 25.3 and hemoglobin of 15.1.  BMP was normal except for sodium of 133 and blood glucose of 143.  Troponin x 1 was normal. Chest x-ray showed worsening pulmonary markings in the mid and lower lungs that could go along with bronchitis, mild patchy pneumonia, bronchiectasis and mucoid impaction. Patient was treated with albuterol, DuoNeb, magnesium 2g, IV hydration was provided.  Hospitalist was asked to admit patient for further evaluation and management.  Review of Systems: Review of systems as noted in the HPI. All other systems reviewed and are negative.   Past Medical History:  Diagnosis Date  . Anxiety   . Aortic atherosclerosis   . COPD (chronic obstructive pulmonary disease)   . Depression   . History of chest pain   . History of syncope   . Hypotension   .  Osteoporosis   . Palpitations   . Panic attacks    Past Surgical History:  Procedure Laterality Date  . ABDOMINAL HYSTERECTOMY      Social History:  reports that she has been smoking cigarettes. She has a 21.00 pack-year smoking history. She has never used smokeless tobacco. She reports that she does not drink alcohol and does not use drugs.   Allergies  Allergen Reactions  . Doxycycline     Chest pain    Family History  Problem Relation Age of Onset  . COPD Mother   . COPD Father   . Diabetes type II Sister   . Kidney failure Sister   . COPD Maternal Grandfather   . Diabetes type II Brother   . Breast cancer Neg Hx     ***  Prior to Admission medications   Medication Sig Start Date End Date Taking? Authorizing Provider  albuterol (VENTOLIN HFA) 108 (90 Base) MCG/ACT inhaler INHALE 2 PUFFS INTO THE LUNGS EVERY 6 (SIX) HOURS AS NEEDED FOR WHEEZING OR SHORTNESS OF BREATH. 12/24/22   Mannam, Praveen, MD  alendronate (FOSAMAX) 70 MG tablet TAKE 1 TABLET EVERY 7 DAYS WITH A FULL GLASS OF WATER ON AN EMPTY STOMACH 02/28/23   Delynn Flavin M, DO  ALPRAZolam (XANAX) 0.5 MG tablet TAKE 1 TABLET BY MOUTH THREE TIMES DAILY AND  1  EXTRA  PRN  FOR  INCREASED  ANXIETY 01/21/23   Mozingo, Thereasa Solo, NP  aspirin EC 81 MG tablet Take 81 mg by mouth daily.  [provider]  atorvastatin (LIPITOR) 40 MG tablet TAKE 1 TABLET EVERY DAY (NEED MD APPOINTMENT FOR REFILLS) 05/09/22   Antoine Poche, MD  Budeson-Glycopyrrol-Formoterol (BREZTRI AEROSPHERE) 160-9-4.8 MCG/ACT AERO Inhale 2 puffs into the lungs 2 (two) times daily. Please fill #3 inahlers for 3 month supply 10/10/22   Delynn Flavin M, DO  busPIRone (BUSPAR) 30 MG tablet TAKE 1 TABLET TWICE DAILY 08/27/22   Delynn Flavin M, DO  DULoxetine (CYMBALTA) 60 MG capsule TAKE 2 CAPSULES (120 MG TOTAL) EVERY DAY 01/02/23   Delynn Flavin M, DO  hydrOXYzine (ATARAX) 50 MG tablet Take one to two tablets at bedtime as  needed for sleep. 01/21/23   Mozingo, Thereasa Solo, NP  ipratropium-albuterol (DUONEB) 0.5-2.5 (3) MG/3ML SOLN INHALE THE CONTENTS OF 1 VIAL VIA NEBULIZER EVERY 6 HOURS AS NEEDED 03/01/23   Mannam, Colbert Coyer, MD  levofloxacin (LEVAQUIN) 500 MG tablet Take 1 tablet (500 mg total) by mouth daily. 03/11/23   Coralyn Helling, MD  meloxicam (MOBIC) 7.5 MG tablet Take 1 tablet (7.5 mg total) by mouth daily. 03/15/22   Alicia Ip, DO  midodrine (PROAMATINE) 2.5 MG tablet Take 1 tablet (2.5 mg total) by mouth 3 (three) times daily as needed (blood pressure less than 90/60). 01/28/23 04/28/23  Sharlene Dory, NP  nitroGLYCERIN (NITROSTAT) 0.4 MG SL tablet Place 1 tablet (0.4 mg total) under the tongue every 5 (five) minutes x 3 doses as needed for chest pain (if o relief after 2nd dose, proceed to the ED for an evaluation or call 911). 01/28/23   Sharlene Dory, NP  predniSONE (DELTASONE) 10 MG tablet Take 3 tablets (30 mg total) by mouth daily with breakfast for 2 days, THEN 2 tablets (20 mg total) daily with breakfast for 2 days, THEN 1 tablet (10 mg total) daily with breakfast for 2 days. 03/11/23 03/17/23  Coralyn Helling, MD  Respiratory Therapy Supplies (NEBULIZER/TUBING/MOUTHPIECE) KIT 1 Units by Does not apply route 4 (four) times daily. GIVE face mask and tubing for nebulizer. J44.1 11/21/21   Delynn Flavin M, DO  rOPINIRole (REQUIP) 1 MG tablet TAKE 1-4 TABLETS BY MOUTH AT BEDTIME. 02/28/23   Alicia Ip, DO    Physical Exam: BP 112/68   Pulse 97   Temp 98.6 F (37 C) (Oral)   Resp (!) 22   Ht 5\' 4"  (1.626 m)   Wt 50.5 kg   SpO2 94%   BMI 19.11 kg/m   General: 62 y.o. year-old female well developed well nourished in no acute distress.  Alert and oriented x3. HEENT: NCAT, EOMI Neck: Supple, trachea medial Cardiovascular: Regular rate and rhythm with no rubs or gallops.  No thyromegaly or JVD noted.  No lower extremity edema. 2/4 pulses in all 4 extremities. Respiratory: Tachypnea,  decreased breath sounds on auscultation.   Abdomen: Soft, nontender nondistended with normal bowel sounds x4 quadrants. Muskuloskeletal: No cyanosis, clubbing or edema noted bilaterally Neuro: CN II-XII intact, strength 5/5 x 4, sensation, reflexes intact Skin: No ulcerative lesions noted or rashes Psychiatry: Judgement and insight appear normal. Mood is appropriate for condition and setting          Labs on Admission:  Basic Metabolic Panel: Recent Labs  Lab 03/11/23 1615  NA 133*  K 4.0  CL 98  CO2 24  GLUCOSE 143*  BUN 13  CREATININE 0.76  CALCIUM 9.0   Liver Function Tests: No results for input(s): "AST", "ALT", "ALKPHOS", "BILITOT", "PROT", "ALBUMIN" in the last 168 hours. No results  for input(s): "LIPASE", "AMYLASE" in the last 168 hours. No results for input(s): "AMMONIA" in the last 168 hours. CBC: Recent Labs  Lab 03/11/23 1615  WBC 25.3*  HGB 15.1*  HCT 43.7  MCV 90.7  PLT 317   Cardiac Enzymes: No results for input(s): "CKTOTAL", "CKMB", "CKMBINDEX", "TROPONINI" in the last 168 hours.  BNP (last 3 results) No results for input(s): "BNP" in the last 8760 hours.  ProBNP (last 3 results) No results for input(s): "PROBNP" in the last 8760 hours.  CBG: No results for input(s): "GLUCAP" in the last 168 hours.  Radiological Exams on Admission: DG Chest Port 1 View  Result Date: 03/11/2023 CLINICAL DATA:  Shortness of breath. Hypoxia. EXAM: PORTABLE CHEST 1 VIEW COMPARISON:  Radiograph 4 hours ago FINDINGS: The heart is normal in size. Stable mediastinal contours. Ill-defined opacity in both infrahilar regions and lung bases without significant interval change from earlier today. Background emphysema. No pneumothorax or developing effusion. No acute osseous findings. IMPRESSION: No significant change in ill-defined bibasilar and infrahilar opacities since earlier today, atelectasis versus infection. Electronically Signed   By: Narda Rutherford M.D.   On:  03/11/2023 16:28   DG Chest 2 View  Result Date: 03/11/2023 CLINICAL DATA:  COPD exacerbation.  Right-sided chest pain. EXAM: CHEST - 2 VIEW COMPARISON:  02/04/2023 FINDINGS: Heart size remains normal. Mediastinal shadows are normal. There are worsened pulmonary markings in the mid and lower lungs that could go along with bronchitis, mild patchy pneumonia, bronchiectasis and mucoid impaction. No pleural effusion. No lobar consolidation or collapse. No acute bone finding. IMPRESSION: Worsened pulmonary markings in the mid and lower lungs that could go along with bronchitis, mild patchy pneumonia, bronchiectasis and mucoid impaction. Electronically Signed   By: Paulina Fusi M.D.   On: 03/11/2023 12:17    EKG: I independently viewed the EKG done and my findings are as followed: Sinus tachycardia at a rate of 127 bpm  Assessment/Plan Present on Admission: . COPD with acute exacerbation  Principal Problem:   COPD with acute exacerbation  Acute bronchitis with possible superimposed COPD exacerbation Acute on chronic respiratory failure with hypoxia Continue Duonebs, Mucinex, Solu-Medrol, azithromycin. Continue Protonix to prevent steroid-induced ulcer Continue incentive spirometry and flutter valve Continue supplemental oxygen to maintain O2 sat > 92% with plan to wean patient off oxygen as tolerated  Mixed hyperlipidemia Anxiety Tobacco abuse   DVT prophylaxis: ***   Code Status: ***   Family Communication: ***   Disposition Plan: ***   Consults called: ***   Admission status: ***     Frankey Shown MD [facility] Pager [phone]  If 7PM-7AM, please contact night-coverage [email or web address] Password [facility]  03/11/2023, 11:47 PM          Review of Systems: {ROS_Text:26778} Past Medical History:  Diagnosis Date  . Anxiety   . Aortic atherosclerosis   . COPD (chronic obstructive pulmonary disease)   . Depression   . History of chest pain   . History of  syncope   . Hypotension   . Osteoporosis   . Palpitations   . Panic attacks    Past Surgical History:  Procedure Laterality Date  . ABDOMINAL HYSTERECTOMY     Social History:  reports that she has been smoking cigarettes. She has a 21.00 pack-year smoking history. She has never used smokeless tobacco. She reports that she does not drink alcohol and does not use drugs.  Allergies  Allergen Reactions  . Doxycycline     Chest pain  Family History  Problem Relation Age of Onset  . COPD Mother   . COPD Father   . Diabetes type II Sister   . Kidney failure Sister   . COPD Maternal Grandfather   . Diabetes type II Brother   . Breast cancer Neg Hx     Prior to Admission medications   Medication Sig Start Date End Date Taking? Authorizing Provider  albuterol (VENTOLIN HFA) 108 (90 Base) MCG/ACT inhaler INHALE 2 PUFFS INTO THE LUNGS EVERY 6 (SIX) HOURS AS NEEDED FOR WHEEZING OR SHORTNESS OF BREATH. 12/24/22   Mannam, Praveen, MD  alendronate (FOSAMAX) 70 MG tablet TAKE 1 TABLET EVERY 7 DAYS WITH A FULL GLASS OF WATER ON AN EMPTY STOMACH 02/28/23   Delynn Flavin M, DO  ALPRAZolam (XANAX) 0.5 MG tablet TAKE 1 TABLET BY MOUTH THREE TIMES DAILY AND  1  EXTRA  PRN  FOR  INCREASED  ANXIETY 01/21/23   Mozingo, Thereasa Solo, NP  aspirin EC 81 MG tablet Take 81 mg by mouth daily.    [provider]  atorvastatin (LIPITOR) 40 MG tablet TAKE 1 TABLET EVERY DAY (NEED MD APPOINTMENT FOR REFILLS) 05/09/22   Antoine Poche, MD  Budeson-Glycopyrrol-Formoterol (BREZTRI AEROSPHERE) 160-9-4.8 MCG/ACT AERO Inhale 2 puffs into the lungs 2 (two) times daily. Please fill #3 inahlers for 3 month supply 10/10/22   Delynn Flavin M, DO  busPIRone (BUSPAR) 30 MG tablet TAKE 1 TABLET TWICE DAILY 08/27/22   Delynn Flavin M, DO  DULoxetine (CYMBALTA) 60 MG capsule TAKE 2 CAPSULES (120 MG TOTAL) EVERY DAY 01/02/23   Delynn Flavin M, DO  hydrOXYzine (ATARAX) 50 MG tablet Take one to two tablets  at bedtime as needed for sleep. 01/21/23   Mozingo, Thereasa Solo, NP  ipratropium-albuterol (DUONEB) 0.5-2.5 (3) MG/3ML SOLN INHALE THE CONTENTS OF 1 VIAL VIA NEBULIZER EVERY 6 HOURS AS NEEDED 03/01/23   Mannam, Colbert Coyer, MD  levofloxacin (LEVAQUIN) 500 MG tablet Take 1 tablet (500 mg total) by mouth daily. 03/11/23   Coralyn Helling, MD  meloxicam (MOBIC) 7.5 MG tablet Take 1 tablet (7.5 mg total) by mouth daily. 03/15/22   Alicia Ip, DO  midodrine (PROAMATINE) 2.5 MG tablet Take 1 tablet (2.5 mg total) by mouth 3 (three) times daily as needed (blood pressure less than 90/60). 01/28/23 04/28/23  Sharlene Dory, NP  nitroGLYCERIN (NITROSTAT) 0.4 MG SL tablet Place 1 tablet (0.4 mg total) under the tongue every 5 (five) minutes x 3 doses as needed for chest pain (if o relief after 2nd dose, proceed to the ED for an evaluation or call 911). 01/28/23   Sharlene Dory, NP  predniSONE (DELTASONE) 10 MG tablet Take 3 tablets (30 mg total) by mouth daily with breakfast for 2 days, THEN 2 tablets (20 mg total) daily with breakfast for 2 days, THEN 1 tablet (10 mg total) daily with breakfast for 2 days. 03/11/23 03/17/23  Coralyn Helling, MD  Respiratory Therapy Supplies (NEBULIZER/TUBING/MOUTHPIECE) KIT 1 Units by Does not apply route 4 (four) times daily. GIVE face mask and tubing for nebulizer. J44.1 11/21/21   Delynn Flavin M, DO  rOPINIRole (REQUIP) 1 MG tablet TAKE 1-4 TABLETS BY MOUTH AT BEDTIME. 02/28/23   Alicia Ip, DO    Physical Exam: Vitals:   03/11/23 1640 03/11/23 1700 03/11/23 1730 03/11/23 2028  BP:  102/80 117/86   Pulse:  (!) 118 (!) 117   Resp:  (!) 22 (!) 24   Temp:      TempSrc:  SpO2: 99% 95% 95% 99%  Weight:      Height:       *** Data Reviewed: {Tip this will not be part of the note when signed- Document your independent interpretation of telemetry tracing, EKG, lab, Radiology test or any other diagnostic tests. Add any new diagnostic test ordered  today. (Optional):26781} {Results:26384}  Assessment and Plan: No notes have been filed under this hospital service. Service: Hospitalist     Advance Care Planning:   Code Status: Prior ***  Consults: ***  Family Communication: ***  Severity of Illness: {Observation/Inpatient:21159}  Author: Frankey Shown, DO 03/11/2023 8:39 PM  For on call review [email or web address].

## 2023-03-11 NOTE — H&P (Addendum)
History and Physical    Patient: Alicia Sanchez BJY:[id number] DOB: Mar 18, 1961 DOA: 03/11/2023 DOS: the patient was seen and examined on 03/12/2023 PCP: [name], DO  Patient coming from: Home  Chief Complaint:  Chief Complaint  Patient presents with   Shortness of Breath   HPI: Alicia Sanchez is a 62 y.o. female with medical history significant of COPD on 2 LPM of oxygen as needed at baseline, tobacco abuse, hyperlipidemia, anxiety who presents to the emergency department via EMS due to shortness of breath.  Patient saw her pulmonologist this morning due to 3-day onset of increased cough with production of greenish sputum, runny nose, occasional nausea and shortness of breath with associated right-sided chest pain when she takes deep breath or coughs.  Home albuterol and nebulizer only provided minimal relief.  She has been dependent more on oxygen than baseline need.  She took prednisone 60 mg p.o. x 1 prior to going to the ED  ED Course:  In the emergency department, she was tachypneic and tachycardic, BP was 112/73, O2 sat was 95% on supplemental oxygen via [place] 2 LPM.  Other vital signs were within normal range.  Workup in the ED showed normal CBC except for WBC of 25.3 and hemoglobin of 15.1.  BMP was normal except for sodium of 133 and blood glucose of 143.  Troponin x 1 was normal. Chest x-ray showed worsening pulmonary markings in the mid and lower lungs that could go along with bronchitis, mild patchy pneumonia, bronchiectasis and mucoid impaction. Patient was treated with albuterol, DuoNeb, magnesium 2g, IV hydration was provided.  Hospitalist was asked to admit patient for further evaluation and management.  Review of Systems: Review of systems as noted in the HPI. All other systems reviewed and are negative.   Past Medical History:  Diagnosis Date   Anxiety    Aortic atherosclerosis    COPD (chronic obstructive pulmonary disease)    Depression    History of chest  pain    History of syncope    Hypotension    Osteoporosis    Palpitations    Panic attacks    Past Surgical History:  Procedure Laterality Date   ABDOMINAL HYSTERECTOMY      Social History:  reports that she has been smoking cigarettes. She has a 21.00 pack-year smoking history. She has never used smokeless tobacco. She reports that she does not drink alcohol and does not use drugs.   Allergies  Allergen Reactions   Doxycycline     Chest pain    Family History  Problem Relation Age of Onset   COPD Mother    COPD Father    Diabetes type II Sister    Kidney failure Sister    COPD Maternal Grandfather    Diabetes type II Brother    Breast cancer Neg Hx      Prior to Admission medications   Medication Sig Start Date End Date Taking? Authorizing Provider  albuterol (VENTOLIN HFA) 108 (90 Base) MCG/ACT inhaler INHALE 2 PUFFS INTO THE LUNGS EVERY 6 (SIX) HOURS AS NEEDED FOR WHEEZING OR SHORTNESS OF BREATH. 12/24/22   Mannam, Praveen, MD  alendronate (FOSAMAX) 70 MG tablet TAKE 1 TABLET EVERY 7 DAYS WITH A FULL GLASS OF WATER ON AN EMPTY STOMACH 02/28/23   Delynn Flavin M, DO  ALPRAZolam (XANAX) 0.5 MG tablet TAKE 1 TABLET BY MOUTH THREE TIMES DAILY AND  1  EXTRA  PRN  FOR  INCREASED  ANXIETY 01/21/23   Mozingo, [employer]  Nattalie, NP  aspirin EC 81 MG tablet Take 81 mg by mouth daily.    [provider]  atorvastatin (LIPITOR) 40 MG tablet TAKE 1 TABLET EVERY DAY (NEED MD APPOINTMENT FOR REFILLS) 05/09/22   Antoine Poche, MD  Budeson-Glycopyrrol-Formoterol (BREZTRI AEROSPHERE) 160-9-4.8 MCG/ACT AERO Inhale 2 puffs into the lungs 2 (two) times daily. Please fill #3 inahlers for 3 month supply 10/10/22   Delynn Flavin M, DO  busPIRone (BUSPAR) 30 MG tablet TAKE 1 TABLET TWICE DAILY 08/27/22   Delynn Flavin M, DO  DULoxetine (CYMBALTA) 60 MG capsule TAKE 2 CAPSULES (120 MG TOTAL) EVERY DAY 01/02/23   Delynn Flavin M, DO  hydrOXYzine (ATARAX) 50 MG tablet Take one to  two tablets at bedtime as needed for sleep. 01/21/23   Mozingo, Thereasa Solo, NP  ipratropium-albuterol (DUONEB) 0.5-2.5 (3) MG/3ML SOLN INHALE THE CONTENTS OF 1 VIAL VIA NEBULIZER EVERY 6 HOURS AS NEEDED 03/01/23   Mannam, Colbert Coyer, MD  levofloxacin (LEVAQUIN) 500 MG tablet Take 1 tablet (500 mg total) by mouth daily. 03/11/23   Coralyn Helling, MD  meloxicam (MOBIC) 7.5 MG tablet Take 1 tablet (7.5 mg total) by mouth daily. 03/15/22   [name], DO  midodrine (PROAMATINE) 2.5 MG tablet Take 1 tablet (2.5 mg total) by mouth 3 (three) times daily as needed (blood pressure less than 90/60). 01/28/23 04/28/23  Sharlene Dory, NP  nitroGLYCERIN (NITROSTAT) 0.4 MG SL tablet Place 1 tablet (0.4 mg total) under the tongue every 5 (five) minutes x 3 doses as needed for chest pain (if o relief after 2nd dose, proceed to the ED for an evaluation or call 911). 01/28/23   Sharlene Dory, NP  predniSONE (DELTASONE) 10 MG tablet Take 3 tablets (30 mg total) by mouth daily with breakfast for 2 days, THEN 2 tablets (20 mg total) daily with breakfast for 2 days, THEN 1 tablet (10 mg total) daily with breakfast for 2 days. 03/11/23 03/17/23  Coralyn Helling, MD  Respiratory Therapy Supplies (NEBULIZER/TUBING/MOUTHPIECE) KIT 1 Units by Does not apply route 4 (four) times daily. GIVE face mask and tubing for nebulizer. J44.1 11/21/21   Delynn Flavin M, DO  rOPINIRole (REQUIP) 1 MG tablet TAKE 1-4 TABLETS BY MOUTH AT BEDTIME. 02/28/23   [name], DO    Physical Exam: BP 104/68   Pulse 85   Temp 98.6 F (37 C) (Oral)   Resp (!) 22   Ht 5\' 4"  (1.626 m)   Wt 50.5 kg   SpO2 100%   BMI 19.11 kg/m   General: 62 y.o. year-old female well developed well nourished in no acute distress.  Alert and oriented x3. HEENT: NCAT, EOMI Neck: Supple, trachea medial Cardiovascular: Regular rate and rhythm with no rubs or gallops.  No thyromegaly or JVD noted.  No lower extremity edema. 2/4 pulses in all 4  extremities. Respiratory: Tachypnea, decreased breath sounds on auscultation.   Abdomen: Soft, nontender nondistended with normal bowel sounds x4 quadrants. Muskuloskeletal: No cyanosis, clubbing or edema noted bilaterally Neuro: CN II-XII intact, strength 5/5 x 4, sensation, reflexes intact Skin: No ulcerative lesions noted or rashes Psychiatry: Judgement and insight appear normal. Mood is appropriate for condition and setting          Labs on Admission:  Basic Metabolic Panel: Recent Labs  Lab 03/11/23 1615  NA 133*  K 4.0  CL 98  CO2 24  GLUCOSE 143*  BUN 13  CREATININE 0.76  CALCIUM 9.0   Liver Function Tests:  No results for input(s): "AST", "ALT", "ALKPHOS", "BILITOT", "PROT", "ALBUMIN" in the last 168 hours. No results for input(s): "LIPASE", "AMYLASE" in the last 168 hours. No results for input(s): "AMMONIA" in the last 168 hours. CBC: Recent Labs  Lab 03/11/23 1615  WBC 25.3*  HGB 15.1*  HCT 43.7  MCV 90.7  PLT 317   Cardiac Enzymes: No results for input(s): "CKTOTAL", "CKMB", "CKMBINDEX", "TROPONINI" in the last 168 hours.  BNP (last 3 results) No results for input(s): "BNP" in the last 8760 hours.  ProBNP (last 3 results) No results for input(s): "PROBNP" in the last 8760 hours.  CBG: No results for input(s): "GLUCAP" in the last 168 hours.  Radiological Exams on Admission: DG Chest Port 1 View  Result Date: 03/11/2023 CLINICAL DATA:  Shortness of breath. Hypoxia. EXAM: PORTABLE CHEST 1 VIEW COMPARISON:  Radiograph 4 hours ago FINDINGS: The heart is normal in size. Stable mediastinal contours. Ill-defined opacity in both infrahilar regions and lung bases without significant interval change from earlier today. Background emphysema. No pneumothorax or developing effusion. No acute osseous findings. IMPRESSION: No significant change in ill-defined bibasilar and infrahilar opacities since earlier today, atelectasis versus infection. Electronically Signed    By: Narda Rutherford M.D.   On: 03/11/2023 16:28   DG Chest 2 View  Result Date: 03/11/2023 CLINICAL DATA:  COPD exacerbation.  Right-sided chest pain. EXAM: CHEST - 2 VIEW COMPARISON:  02/04/2023 FINDINGS: Heart size remains normal. Mediastinal shadows are normal. There are worsened pulmonary markings in the mid and lower lungs that could go along with bronchitis, mild patchy pneumonia, bronchiectasis and mucoid impaction. No pleural effusion. No lobar consolidation or collapse. No acute bone finding. IMPRESSION: Worsened pulmonary markings in the mid and lower lungs that could go along with bronchitis, mild patchy pneumonia, bronchiectasis and mucoid impaction. Electronically Signed   By: Paulina Fusi M.D.   On: 03/11/2023 12:17    EKG: I independently viewed the EKG done and my findings are as followed: Sinus tachycardia at a rate of 127 bpm  Assessment/Plan Present on Admission:  COPD with acute exacerbation  Acute on chronic respiratory failure with hypoxia  Tobacco abuse  Principal Problem:   Acute bronchitis Active Problems:   Tobacco abuse   Acute on chronic respiratory failure with hypoxia   COPD with acute exacerbation   Mixed hyperlipidemia   Low blood pressure  Acute bronchitis with possible superimposed COPD exacerbation Acute on chronic respiratory failure with hypoxia Continue Duonebs, Mucinex, Solu-Medrol, azithromycin. Continue Protonix to prevent steroid-induced ulcer Continue incentive spirometry and flutter valve Continue supplemental oxygen to maintain O2 sat > 92% with plan to wean patient off oxygen as tolerated  Mixed hyperlipidemia Continue Lipitor  Low blood pressure Continue midodrine  Tobacco abuse Patient was counseled on tobacco abuse cessation  Restless leg syndrome Continue Requip   DVT prophylaxis: Lovenox  Advance Care Planning: Full code  Consults: None  Family Communication: None at bedside  Severity of Illness: The appropriate  patient status for this patient is OBSERVATION. Observation status is judged to be reasonable and necessary in order to provide the required intensity of service to ensure the patient's safety. The patient's presenting symptoms, physical exam findings, and initial radiographic and laboratory data in the context of their medical condition is felt to place them at decreased risk for further clinical deterioration. Furthermore, it is anticipated that the patient will be medically stable for discharge from the hospital within 2 midnights of admission.   Author: Frankey Shown,  DO 03/12/2023 12:24 AM  For on call review [email or web address].

## 2023-03-11 NOTE — ED Provider Notes (Signed)
[place] EMERGENCY DEPARTMENT AT [facility] Provider Note   CSN: [id number] Arrival date & time: 03/11/23  1536     History {Add pertinent medical, surgical, social history, OB history to HPI:1} Chief Complaint  Patient presents with   Shortness of Breath    Alicia Sanchez is a 62 y.o. female.  Patient has a history of COPD.  She complains of some shortness of breath and coughing up yellow-green stuff.  Patient spoke to her pulmonologist and she was started on prednisone and Levaquin today but became more short of breath and her O2 sats were 74% on room air    Shortness of Breath      Home Medications Prior to Admission medications   Medication Sig Start Date End Date Taking? Authorizing Provider  albuterol (VENTOLIN HFA) 108 (90 Base) MCG/ACT inhaler INHALE 2 PUFFS INTO THE LUNGS EVERY 6 (SIX) HOURS AS NEEDED FOR WHEEZING OR SHORTNESS OF BREATH. 12/24/22   Mannam, Praveen, MD  alendronate (FOSAMAX) 70 MG tablet TAKE 1 TABLET EVERY 7 DAYS WITH A FULL GLASS OF WATER ON AN EMPTY STOMACH 02/28/23   Delynn Flavin M, DO  ALPRAZolam (XANAX) 0.5 MG tablet TAKE 1 TABLET BY MOUTH THREE TIMES DAILY AND  1  EXTRA  PRN  FOR  INCREASED  ANXIETY 01/21/23   Mozingo, Thereasa Solo, NP  aspirin EC 81 MG tablet Take 81 mg by mouth daily.    [provider]  atorvastatin (LIPITOR) 40 MG tablet TAKE 1 TABLET EVERY DAY (NEED MD APPOINTMENT FOR REFILLS) 05/09/22   Antoine Poche, MD  Budeson-Glycopyrrol-Formoterol (BREZTRI AEROSPHERE) 160-9-4.8 MCG/ACT AERO Inhale 2 puffs into the lungs 2 (two) times daily. Please fill #3 inahlers for 3 month supply 10/10/22   Delynn Flavin M, DO  busPIRone (BUSPAR) 30 MG tablet TAKE 1 TABLET TWICE DAILY 08/27/22   Delynn Flavin M, DO  DULoxetine (CYMBALTA) 60 MG capsule TAKE 2 CAPSULES (120 MG TOTAL) EVERY DAY 01/02/23   Delynn Flavin M, DO  hydrOXYzine (ATARAX) 50 MG tablet Take one to two tablets at bedtime as needed for sleep.  01/21/23   Mozingo, Thereasa Solo, NP  ipratropium-albuterol (DUONEB) 0.5-2.5 (3) MG/3ML SOLN INHALE THE CONTENTS OF 1 VIAL VIA NEBULIZER EVERY 6 HOURS AS NEEDED 03/01/23   Mannam, Colbert Coyer, MD  levofloxacin (LEVAQUIN) 500 MG tablet Take 1 tablet (500 mg total) by mouth daily. 03/11/23   Coralyn Helling, MD  meloxicam (MOBIC) 7.5 MG tablet Take 1 tablet (7.5 mg total) by mouth daily. 03/15/22   [name], DO  midodrine (PROAMATINE) 2.5 MG tablet Take 1 tablet (2.5 mg total) by mouth 3 (three) times daily as needed (blood pressure less than 90/60). 01/28/23 04/28/23  Sharlene Dory, NP  nitroGLYCERIN (NITROSTAT) 0.4 MG SL tablet Place 1 tablet (0.4 mg total) under the tongue every 5 (five) minutes x 3 doses as needed for chest pain (if o relief after 2nd dose, proceed to the ED for an evaluation or call 911). 01/28/23   Sharlene Dory, NP  predniSONE (DELTASONE) 10 MG tablet Take 3 tablets (30 mg total) by mouth daily with breakfast for 2 days, THEN 2 tablets (20 mg total) daily with breakfast for 2 days, THEN 1 tablet (10 mg total) daily with breakfast for 2 days. 03/11/23 03/17/23  Coralyn Helling, MD  Respiratory Therapy Supplies (NEBULIZER/TUBING/MOUTHPIECE) KIT 1 Units by Does not apply route 4 (four) times daily. GIVE face mask and tubing for nebulizer. J44.1 11/21/21   [name], DO  rOPINIRole (REQUIP) 1 MG tablet TAKE 1-4 TABLETS BY MOUTH AT BEDTIME. 02/28/23   Delynn Flavin M, DO      Allergies    Doxycycline    Review of Systems   Review of Systems  Respiratory:  Positive for shortness of breath.     Physical Exam Updated Vital Signs BP 117/86   Pulse (!) 117   Temp 98.6 F (37 C) (Oral)   Resp (!) 24   Ht  (1.626 m)   Wt 50.5 kg   SpO2 99%   BMI 19.11 kg/m  Physical Exam  ED Results / Procedures / Treatments   Labs (all labs ordered are listed, but only abnormal results are displayed) Labs Reviewed  BASIC METABOLIC PANEL - Abnormal; Notable for the  following components:      Result Value   Sodium 133 (*)    Glucose, Bld 143 (*)    All other components within normal limits  CBC - Abnormal; Notable for the following components:   WBC 25.3 (*)    Hemoglobin 15.1 (*)    All other components within normal limits  TROPONIN I (HIGH SENSITIVITY)    EKG None  Radiology DG Chest Port 1 View  Result Date: 03/11/2023 CLINICAL DATA:  Shortness of breath. Hypoxia. EXAM: PORTABLE CHEST 1 VIEW COMPARISON:  Radiograph 4 hours ago FINDINGS: The heart is normal in size. Stable mediastinal contours. Ill-defined opacity in both infrahilar regions and lung bases without significant interval change from earlier today. Background emphysema. No pneumothorax or developing effusion. No acute osseous findings. IMPRESSION: No significant change in ill-defined bibasilar and infrahilar opacities since earlier today, atelectasis versus infection. Electronically Signed   By: Narda Rutherford M.D.   On: 03/11/2023 16:28   DG Chest 2 View  Result Date: 03/11/2023 CLINICAL DATA:  COPD exacerbation.  Right-sided chest pain. EXAM: CHEST - 2 VIEW COMPARISON:  02/04/2023 FINDINGS: Heart size remains normal. Mediastinal shadows are normal. There are worsened pulmonary markings in the mid and lower lungs that could go along with bronchitis, mild patchy pneumonia, bronchiectasis and mucoid impaction. No pleural effusion. No lobar consolidation or collapse. No acute bone finding. IMPRESSION: Worsened pulmonary markings in the mid and lower lungs that could go along with bronchitis, mild patchy pneumonia, bronchiectasis and mucoid impaction. Electronically Signed   By: Paulina Fusi M.D.   On: 03/11/2023 12:17    Procedures Procedures  {Document cardiac monitor, telemetry assessment procedure when appropriate:1}  Medications Ordered in ED Medications  albuterol (VENTOLIN HFA) 108 (90 Base) MCG/ACT inhaler 2 puff (has no administration in time range)  sodium chloride 0.9 %  bolus 1,000 mL (has no administration in time range)  ipratropium-albuterol (DUONEB) 0.5-2.5 (3) MG/3ML nebulizer solution 3 mL (3 mLs Nebulization Given 03/11/23 1640)  albuterol (PROVENTIL) (2.5 MG/3ML) 0.083% nebulizer solution 2.5 mg (2.5 mg Nebulization Given 03/11/23 1640)  magnesium sulfate IVPB 2 g 50 mL (0 g Intravenous Stopped 03/11/23 1758)  ipratropium-albuterol (DUONEB) 0.5-2.5 (3) MG/3ML nebulizer solution 3 mL (3 mLs Nebulization Given 03/11/23 2027)  albuterol (PROVENTIL) (2.5 MG/3ML) 0.083% nebulizer solution 2.5 mg (2.5 mg Nebulization Given 03/11/23 2027)    ED Course/ Medical Decision Making/ A&P   {  CRITICAL CARE Performed by: Bethann Berkshire Total critical care time: 45 minutes Critical care time was exclusive of separately billable procedures and treating other patients. Critical care was necessary to treat or prevent imminent or life-threatening deterioration. Critical care was time spent personally by me on the following activities:  development of treatment plan with patient and/or surrogate as well as nursing, discussions with consultants, evaluation of patient's response to treatment, examination of patient, obtaining history from patient or surrogate, ordering and performing treatments and interventions, ordering and review of laboratory studies, ordering and review of radiographic studies, pulse oximetry and re-evaluation of patient's condition.  Click here for ABCD2, HEART and other calculatorsREFRESH Note before signing :1}                          Medical Decision Making Amount and/or Complexity of Data Reviewed Labs: ordered. Radiology: ordered.  Risk Prescription drug management. Decision regarding hospitalization.  Patient with COPD exacerbation and hypoxia with possible pneumonia.  She will be admitted to medicine  {Document critical care time when appropriate:1} {Document review of labs and clinical decision tools ie heart score, Chads2Vasc2 etc:1}   {Document your independent review of radiology images, and any outside records:1} {Document your discussion with family members, caretakers, and with consultants:1} {Document social determinants of health affecting pt's care:1} {Document your decision making why or why not admission, treatments were needed:1} Final Clinical Impression(s) / ED Diagnoses Final diagnoses:  COPD exacerbation    Rx / DC Orders ED Discharge Orders     None

## 2023-03-11 NOTE — ED Triage Notes (Signed)
Patient called EMS for SOB. Patient initial oxygen saturation 78% RA. EMS initiated 2.5 albuterol and did not give the patient solumedrol due to patient taking 60 of prednisone. Patient up to 98% on 3L and after tx. Per EMS patient has diminished breath sounds bilaterally lower lungs, rt side rhonchi.

## 2023-03-11 NOTE — ED Notes (Signed)
Pt requested to walk to restroom (across hall from room) w/o supplemental O2.  Pt's oxygen noted to be 87% RA.  Pt placed back on 2L [place].

## 2023-03-12 ENCOUNTER — Telehealth: Payer: Medicare HMO

## 2023-03-12 ENCOUNTER — Other Ambulatory Visit: Payer: Self-pay | Admitting: *Deleted

## 2023-03-12 ENCOUNTER — Encounter (HOSPITAL_COMMUNITY): Payer: Self-pay | Admitting: Internal Medicine

## 2023-03-12 DIAGNOSIS — F410 Panic disorder [episodic paroxysmal anxiety] without agoraphobia: Secondary | ICD-10-CM | POA: Diagnosis present

## 2023-03-12 DIAGNOSIS — I700 Atherosclerosis of aorta: Secondary | ICD-10-CM | POA: Diagnosis present

## 2023-03-12 DIAGNOSIS — Z841 Family history of disorders of kidney and ureter: Secondary | ICD-10-CM | POA: Diagnosis not present

## 2023-03-12 DIAGNOSIS — Z881 Allergy status to other antibiotic agents status: Secondary | ICD-10-CM | POA: Diagnosis not present

## 2023-03-12 DIAGNOSIS — Z7983 Long term (current) use of bisphosphonates: Secondary | ICD-10-CM | POA: Diagnosis not present

## 2023-03-12 DIAGNOSIS — M79604 Pain in right leg: Secondary | ICD-10-CM

## 2023-03-12 DIAGNOSIS — F17210 Nicotine dependence, cigarettes, uncomplicated: Secondary | ICD-10-CM | POA: Diagnosis present

## 2023-03-12 DIAGNOSIS — M810 Age-related osteoporosis without current pathological fracture: Secondary | ICD-10-CM | POA: Diagnosis present

## 2023-03-12 DIAGNOSIS — J209 Acute bronchitis, unspecified: Secondary | ICD-10-CM | POA: Diagnosis present

## 2023-03-12 DIAGNOSIS — F32A Depression, unspecified: Secondary | ICD-10-CM | POA: Diagnosis present

## 2023-03-12 DIAGNOSIS — E782 Mixed hyperlipidemia: Secondary | ICD-10-CM | POA: Diagnosis present

## 2023-03-12 DIAGNOSIS — M5136 Other intervertebral disc degeneration, lumbar region: Secondary | ICD-10-CM

## 2023-03-12 DIAGNOSIS — I959 Hypotension, unspecified: Secondary | ICD-10-CM | POA: Diagnosis present

## 2023-03-12 DIAGNOSIS — J9621 Acute and chronic respiratory failure with hypoxia: Secondary | ICD-10-CM | POA: Diagnosis present

## 2023-03-12 DIAGNOSIS — J441 Chronic obstructive pulmonary disease with (acute) exacerbation: Secondary | ICD-10-CM | POA: Diagnosis present

## 2023-03-12 DIAGNOSIS — J440 Chronic obstructive pulmonary disease with acute lower respiratory infection: Secondary | ICD-10-CM | POA: Diagnosis present

## 2023-03-12 DIAGNOSIS — Z791 Long term (current) use of non-steroidal anti-inflammatories (NSAID): Secondary | ICD-10-CM | POA: Diagnosis not present

## 2023-03-12 DIAGNOSIS — G2581 Restless legs syndrome: Secondary | ICD-10-CM | POA: Diagnosis present

## 2023-03-12 DIAGNOSIS — Z825 Family history of asthma and other chronic lower respiratory diseases: Secondary | ICD-10-CM | POA: Diagnosis not present

## 2023-03-12 DIAGNOSIS — Z833 Family history of diabetes mellitus: Secondary | ICD-10-CM | POA: Diagnosis not present

## 2023-03-12 DIAGNOSIS — Z79899 Other long term (current) drug therapy: Secondary | ICD-10-CM | POA: Diagnosis not present

## 2023-03-12 DIAGNOSIS — Z7982 Long term (current) use of aspirin: Secondary | ICD-10-CM | POA: Diagnosis not present

## 2023-03-12 DIAGNOSIS — Z716 Tobacco abuse counseling: Secondary | ICD-10-CM | POA: Diagnosis not present

## 2023-03-12 DIAGNOSIS — Z90710 Acquired absence of both cervix and uterus: Secondary | ICD-10-CM | POA: Diagnosis not present

## 2023-03-12 LAB — COMPREHENSIVE METABOLIC PANEL
ALT: 15 U/L (ref 0–44)
AST: 25 U/L (ref 15–41)
Albumin: 3.4 g/dL — ABNORMAL LOW (ref 3.5–5.0)
Alkaline Phosphatase: 57 U/L (ref 38–126)
Anion gap: 10 (ref 5–15)
BUN: 17 mg/dL (ref 8–23)
CO2: 24 mmol/L (ref 22–32)
Calcium: 8.7 mg/dL — ABNORMAL LOW (ref 8.9–10.3)
Chloride: 101 mmol/L (ref 98–111)
Creatinine, Ser: 0.83 mg/dL (ref 0.44–1.00)
GFR, Estimated: >60 mL/min (ref >60)
Glucose, Bld: 161 mg/dL — ABNORMAL HIGH (ref 70–99)
Potassium: 3.9 mmol/L (ref 3.5–5.1)
Sodium: 135 mmol/L (ref 135–145)
Total Bilirubin: 0.6 mg/dL (ref 0.3–1.2)
Total Protein: 6.6 g/dL (ref 6.5–8.1)

## 2023-03-12 LAB — CBC
HCT: 40.9 % (ref 36.0–46.0)
Hemoglobin: 13.5 g/dL (ref 12.0–15.0)
MCH: 30.8 pg (ref 26.0–34.0)
MCHC: 33 g/dL (ref 30.0–36.0)
MCV: 93.2 fL (ref 80.0–100.0)
Platelets: 329 10*3/uL (ref 150–400)
RBC: 4.39 MIL/uL (ref 3.87–5.11)
RDW: 12.9 % (ref 11.5–15.5)
WBC: 19.4 10*3/uL — ABNORMAL HIGH (ref 4.0–10.5)
nRBC: 0 % (ref 0.0–0.2)

## 2023-03-12 LAB — HIV ANTIBODY (ROUTINE TESTING W REFLEX): HIV Screen 4th Generation wRfx: NONREACTIVE

## 2023-03-12 LAB — MAGNESIUM: Magnesium: 1.9 mg/dL (ref 1.7–2.4)

## 2023-03-12 LAB — PHOSPHORUS: Phosphorus: 2.7 mg/dL (ref 2.5–4.6)

## 2023-03-12 MED ORDER — ONDANSETRON HCL 4 MG PO TABS: 4.0000 mg | ORAL_TABLET | Freq: Four times a day (QID) | ORAL | Status: DC | PRN

## 2023-03-12 MED ORDER — ALBUTEROL SULFATE (2.5 MG/3ML) 0.083% IN NEBU: 2.5000 mg | INHALATION_SOLUTION | RESPIRATORY_TRACT | Status: DC | PRN

## 2023-03-12 MED ORDER — IPRATROPIUM-ALBUTEROL 0.5-2.5 (3) MG/3ML IN SOLN
3.0000 mL | Freq: Four times a day (QID) | RESPIRATORY_TRACT | Status: DC
  Administered 2023-03-12 (×2): 3 mL via RESPIRATORY_TRACT
  Filled 2023-03-12 (×2): qty 3

## 2023-03-12 MED ORDER — ACETAMINOPHEN 325 MG PO TABS: 650.0000 mg | ORAL_TABLET | Freq: Four times a day (QID) | ORAL | Status: DC | PRN

## 2023-03-12 MED ORDER — AZITHROMYCIN 250 MG PO TABS
500.0000 mg | ORAL_TABLET | Freq: Every day | ORAL | Status: AC
  Administered 2023-03-12: 500 mg via ORAL
  Filled 2023-03-12: qty 2

## 2023-03-12 MED ORDER — ROPINIROLE HCL 1 MG PO TABS
1.0000 mg | ORAL_TABLET | Freq: Every day | ORAL | Status: DC
  Administered 2023-03-12 – 2023-03-13 (×3): 1 mg via ORAL
  Filled 2023-03-12 (×3): qty 1

## 2023-03-12 MED ORDER — IPRATROPIUM-ALBUTEROL 0.5-2.5 (3) MG/3ML IN SOLN
3.0000 mL | Freq: Four times a day (QID) | RESPIRATORY_TRACT | Status: DC | PRN
  Administered 2023-03-12 – 2023-03-13 (×2): 3 mL via RESPIRATORY_TRACT
  Filled 2023-03-12 (×2): qty 3

## 2023-03-12 MED ORDER — DM-GUAIFENESIN ER 30-600 MG PO TB12
1.0000 | ORAL_TABLET | Freq: Two times a day (BID) | ORAL | Status: DC
  Administered 2023-03-12 – 2023-03-13 (×4): 1 via ORAL
  Filled 2023-03-12 (×4): qty 1

## 2023-03-12 MED ORDER — ALPRAZOLAM 0.5 MG PO TABS
0.5000 mg | ORAL_TABLET | Freq: Three times a day (TID) | ORAL | Status: DC
  Administered 2023-03-12 – 2023-03-14 (×5): 0.5 mg via ORAL
  Filled 2023-03-12 (×5): qty 1

## 2023-03-12 MED ORDER — ATORVASTATIN CALCIUM 40 MG PO TABS
40.0000 mg | ORAL_TABLET | Freq: Every day | ORAL | Status: DC
  Administered 2023-03-12 – 2023-03-14 (×3): 40 mg via ORAL
  Filled 2023-03-12 (×3): qty 1

## 2023-03-12 MED ORDER — MIDODRINE HCL 5 MG PO TABS: 2.5000 mg | ORAL_TABLET | Freq: Three times a day (TID) | ORAL | Status: DC | PRN

## 2023-03-12 MED ORDER — NICOTINE 7 MG/24HR TD PT24
7.0000 mg | MEDICATED_PATCH | Freq: Every day | TRANSDERMAL | Status: DC
  Administered 2023-03-12 – 2023-03-14 (×3): 7 mg via TRANSDERMAL
  Filled 2023-03-12 (×3): qty 1

## 2023-03-12 MED ORDER — ALPRAZOLAM 0.5 MG PO TABS: 0.5000 mg | ORAL_TABLET | Freq: Every day | ORAL | Status: DC | PRN

## 2023-03-12 MED ORDER — ENOXAPARIN SODIUM 40 MG/0.4ML IJ SOSY
40.0000 mg | PREFILLED_SYRINGE | INTRAMUSCULAR | Status: DC
  Administered 2023-03-12: 40 mg via SUBCUTANEOUS
  Filled 2023-03-12: qty 0.4

## 2023-03-12 MED ORDER — GUAIFENESIN-DM 100-10 MG/5ML PO SYRP: 5.0000 mL | ORAL_SOLUTION | ORAL | Status: DC | PRN

## 2023-03-12 MED ORDER — ONDANSETRON HCL 4 MG/2ML IJ SOLN: 4.0000 mg | Freq: Four times a day (QID) | INTRAMUSCULAR | Status: DC | PRN

## 2023-03-12 MED ORDER — METHYLPREDNISOLONE SODIUM SUCC 40 MG IJ SOLR
40.0000 mg | Freq: Two times a day (BID) | INTRAMUSCULAR | Status: DC
  Administered 2023-03-12 – 2023-03-14 (×5): 40 mg via INTRAVENOUS
  Filled 2023-03-12 (×5): qty 1

## 2023-03-12 MED ORDER — PANTOPRAZOLE SODIUM 40 MG PO TBEC
40.0000 mg | DELAYED_RELEASE_TABLET | Freq: Every day | ORAL | Status: DC
  Administered 2023-03-12 – 2023-03-14 (×3): 40 mg via ORAL
  Filled 2023-03-12 (×3): qty 1

## 2023-03-12 MED ORDER — ACETAMINOPHEN 650 MG RE SUPP: 650.0000 mg | Freq: Four times a day (QID) | RECTAL | Status: DC | PRN

## 2023-03-12 MED ORDER — ALPRAZOLAM 0.5 MG PO TABS: 0.5000 mg | ORAL_TABLET | Freq: Two times a day (BID) | ORAL | Status: DC | PRN

## 2023-03-12 MED ORDER — AZITHROMYCIN 250 MG PO TABS
250.0000 mg | ORAL_TABLET | Freq: Every day | ORAL | Status: DC
  Administered 2023-03-13 – 2023-03-14 (×2): 250 mg via ORAL
  Filled 2023-03-12 (×2): qty 1

## 2023-03-12 NOTE — Progress Notes (Addendum)
PROGRESS NOTE    Alicia Sanchez  WUJ:[id number] DOB: [birth year] DOA: 03/11/2023 PCP: [name], DO   Brief Narrative:    Alicia Sanchez is a 62 y.o. female with medical history significant of COPD on 2 LPM of oxygen as needed at baseline, tobacco abuse, hyperlipidemia, anxiety who presents to the emergency department via EMS due to shortness of breath.  She was admitted with acute COPD exacerbation as well as bronchitis and was started on IV steroids, breathing treatments, and azithromycin.  She is slowly improving with discharge anticipated in the next 24 hours.  Assessment & Plan:   Principal Problem:   Acute bronchitis Active Problems:   Tobacco abuse   Restless leg syndrome   Acute on chronic respiratory failure with hypoxia   COPD with acute exacerbation   Mixed hyperlipidemia   Low blood pressure  Assessment and Plan:  Acute bronchitis with possible superimposed COPD exacerbation Acute on chronic respiratory failure with hypoxia Continue Duonebs-now as needed, Mucinex, Solu-Medrol, azithromycin. Continue Protonix to prevent steroid-induced ulcer Continue incentive spirometry and flutter valve Continue supplemental oxygen to maintain O2 sat > 92% with plan to wean patient off oxygen as tolerated   Mixed hyperlipidemia Continue Lipitor   Low blood pressure-stable Continue midodrine   Tobacco abuse Patient was counseled on tobacco abuse cessation Nicotine patch ordered   Restless leg syndrome Continue Requip   DVT prophylaxis:Lovenox Code Status: Full Family Communication: Daughter on phone 4/23 Disposition Plan:  Status is: Observation The patient will require care spanning > 2 midnights and should be moved to inpatient because: Need for ongoing IV medications.  Consultants:  None  Procedures:  None  Antimicrobials:  Anti-infectives (From admission, onward)    Start     Dose/Rate Route Frequency Ordered Stop   03/13/23 1000  azithromycin  (ZITHROMAX) tablet 250 mg       See Hyperspace for full Linked Orders Report.   250 mg Oral Daily 03/12/23 0001 03/17/23 0959   03/12/23 1000  azithromycin (ZITHROMAX) tablet 500 mg       See Hyperspace for full Linked Orders Report.   500 mg Oral Daily 03/12/23 0001 03/13/23 0959       Subjective: Patient seen and evaluated today with no new acute complaints or concerns. No acute concerns or events noted overnight.  She states that her wheezing and shortness of breath have improved, but she is not quite back at baseline.  Objective: Vitals:   03/12/23 0732 03/12/23 0733 03/12/23 0748 03/12/23 0802  BP:  97/71  100/70  Pulse:  89  91  Resp:  14  18  Temp:   98.6 F (37 C) 98.2 F (36.8 C)  TempSrc:   Oral Oral  SpO2: 100% 100%  98%  Weight:    52.2 kg  Height:    5\' 4"  (1.626 m)    Intake/Output Summary (Last 24 hours) at 03/12/2023 1110 Last data filed at 03/12/2023 0848 Gross per 24 hour  Intake 1240 ml  Output 1 ml  Net 1239 ml   Filed Weights   03/11/23 1605 03/12/23 0802  Weight: 50.5 kg 52.2 kg    Examination:  General exam: Appears calm and comfortable  Respiratory system: Clear to auscultation. Respiratory effort normal.  1 L nasal cannula Cardiovascular system: S1 & S2 heard, RRR.  Gastrointestinal system: Abdomen is soft Central nervous system: Alert and awake Extremities: No edema Skin: No significant lesions noted Psychiatry: Flat affect.    Data Reviewed: I have personally  reviewed following labs and imaging studies  CBC: Recent Labs  Lab 03/11/23 1615 03/12/23 0330  WBC 25.3* 19.4*  HGB 15.1* 13.5  HCT 43.7 40.9  MCV 90.7 93.2  PLT 317 329   Basic Metabolic Panel: Recent Labs  Lab 03/11/23 1615 03/12/23 0330  NA 133* 135  K 4.0 3.9  CL 98 101  CO2 24 24  GLUCOSE 143* 161*  BUN 13 17  CREATININE 0.76 0.83  CALCIUM 9.0 8.7*  MG  --  1.9  PHOS  --  2.7   GFR: Estimated Creatinine Clearance: 57.9 mL/min (by C-G formula based  on SCr of 0.83 mg/dL). Liver Function Tests: Recent Labs  Lab 03/12/23 0330  AST 25  ALT 15  ALKPHOS 57  BILITOT 0.6  PROT 6.6  ALBUMIN 3.4*   No results for input(s): "LIPASE", "AMYLASE" in the last 168 hours. No results for input(s): "AMMONIA" in the last 168 hours. Coagulation Profile: No results for input(s): "INR", "PROTIME" in the last 168 hours. Cardiac Enzymes: No results for input(s): "CKTOTAL", "CKMB", "CKMBINDEX", "TROPONINI" in the last 168 hours. BNP (last 3 results) No results for input(s): "PROBNP" in the last 8760 hours. HbA1C: No results for input(s): "HGBA1C" in the last 72 hours. CBG: No results for input(s): "GLUCAP" in the last 168 hours. Lipid Profile: No results for input(s): "CHOL", "HDL", "LDLCALC", "TRIG", "CHOLHDL", "LDLDIRECT" in the last 72 hours. Thyroid Function Tests: No results for input(s): "TSH", "T4TOTAL", "FREET4", "T3FREE", "THYROIDAB" in the last 72 hours. Anemia Panel: No results for input(s): "VITAMINB12", "FOLATE", "FERRITIN", "TIBC", "IRON", "RETICCTPCT" in the last 72 hours. Sepsis Labs: No results for input(s): "PROCALCITON", "LATICACIDVEN" in the last 168 hours.  No results found for this or any previous visit (from the past 240 hour(s)).       Radiology Studies: DG Chest Port 1 View  Result Date: 03/11/2023 CLINICAL DATA:  Shortness of breath. Hypoxia. EXAM: PORTABLE CHEST 1 VIEW COMPARISON:  Radiograph 4 hours ago FINDINGS: The heart is normal in size. Stable mediastinal contours. Ill-defined opacity in both infrahilar regions and lung bases without significant interval change from earlier today. Background emphysema. No pneumothorax or developing effusion. No acute osseous findings. IMPRESSION: No significant change in ill-defined bibasilar and infrahilar opacities since earlier today, atelectasis versus infection. Electronically Signed   By: Narda Rutherford M.D.   On: 03/11/2023 16:28   DG Chest 2 View  Result Date:  03/11/2023 CLINICAL DATA:  COPD exacerbation.  Right-sided chest pain. EXAM: CHEST - 2 VIEW COMPARISON:  02/04/2023 FINDINGS: Heart size remains normal. Mediastinal shadows are normal. There are worsened pulmonary markings in the mid and lower lungs that could go along with bronchitis, mild patchy pneumonia, bronchiectasis and mucoid impaction. No pleural effusion. No lobar consolidation or collapse. No acute bone finding. IMPRESSION: Worsened pulmonary markings in the mid and lower lungs that could go along with bronchitis, mild patchy pneumonia, bronchiectasis and mucoid impaction. Electronically Signed   By: Paulina Fusi M.D.   On: 03/11/2023 12:17        Scheduled Meds:  atorvastatin  40 mg Oral Daily   azithromycin  500 mg Oral Daily   Followed by   Melene Muller ON 03/13/2023] azithromycin  250 mg Oral Daily   dextromethorphan-guaiFENesin  1 tablet Oral BID   enoxaparin (LOVENOX) injection  40 mg Subcutaneous Q24H   methylPREDNISolone (SOLU-MEDROL) injection  40 mg Intravenous Q12H   pantoprazole  40 mg Oral Daily   rOPINIRole  1 mg Oral QHS  LOS: 0 days    Time spent: 35 minutes    Kouper Spinella Hoover Brunette, DO [facility]  If 7PM-7AM, please contact night-coverage [email or web address] 03/12/2023, 11:10 AM

## 2023-03-12 NOTE — Progress Notes (Signed)
Pt requesting requip and xanax. Requip is already scheduled. This RN reached out to overnight coverage, Dr. Thomes Dinning for home xanax 0.5mg . Reynaldo Minium

## 2023-03-12 NOTE — TOC Progression Note (Signed)
  Transition of Care [facility]) Screening Note   Patient Details  Name: Alicia Sanchez Date of Birth: 05-04-1961   Transition of Care [facility]) CM/SW Contact:    Leitha Bleak, RN Phone Number: 03/12/2023, 10:07 AM    Transition of Care [facility]) has reviewed patient and no TOC needs have been identified at this time. We will continue to monitor patient advancement through interdisciplinary progression rounds. If new patient transition needs arise, please place a TOC consult.    Expected Discharge Plan and Services      Living arrangements for the past 2 months: Single Family Home                    Social Determinants of Health (SDOH) Interventions SDOH Screenings   Food Insecurity: No Food Insecurity (03/12/2023)  Housing: Low Risk  (03/12/2023)  Transportation Needs: No Transportation Needs (03/12/2023)  Utilities: Not At Risk (03/12/2023)  Alcohol Screen: Low Risk  (12/20/2022)  Depression (PHQ2-9): Medium Risk (02/04/2023)  Financial Resource Strain: Low Risk  (12/20/2022)  Physical Activity: Inactive (12/20/2022)  Social Connections: Moderately Isolated (12/20/2022)  Stress: No Stress Concern Present (12/20/2022)  Tobacco Use: High Risk (03/12/2023)    Readmission Risk Interventions     No data to display

## 2023-03-12 NOTE — ED Notes (Signed)
Pt receiving breathing treatment at this time

## 2023-03-13 DIAGNOSIS — J209 Acute bronchitis, unspecified: Secondary | ICD-10-CM | POA: Diagnosis not present

## 2023-03-13 LAB — RESPIRATORY PANEL BY PCR

## 2023-03-13 LAB — CBC
HCT: 37.7 % (ref 36.0–46.0)
Hemoglobin: 12.7 g/dL (ref 12.0–15.0)
MCH: 31 pg (ref 26.0–34.0)
MCHC: 33.7 g/dL (ref 30.0–36.0)
MCV: 92 fL (ref 80.0–100.0)
Platelets: 341 10*3/uL (ref 150–400)
RBC: 4.1 MIL/uL (ref 3.87–5.11)
RDW: 13.2 % (ref 11.5–15.5)
WBC: 16.8 10*3/uL — ABNORMAL HIGH (ref 4.0–10.5)
nRBC: 0 % (ref 0.0–0.2)

## 2023-03-13 LAB — BASIC METABOLIC PANEL
Anion gap: 8 (ref 5–15)
BUN: 14 mg/dL (ref 8–23)
CO2: 26 mmol/L (ref 22–32)
Calcium: 8.5 mg/dL — ABNORMAL LOW (ref 8.9–10.3)
Chloride: 103 mmol/L (ref 98–111)
Creatinine, Ser: 0.74 mg/dL (ref 0.44–1.00)
GFR, Estimated: >60 mL/min (ref >60)
Glucose, Bld: 110 mg/dL — ABNORMAL HIGH (ref 70–99)
Potassium: 3.7 mmol/L (ref 3.5–5.1)
Sodium: 137 mmol/L (ref 135–145)

## 2023-03-13 LAB — MAGNESIUM: Magnesium: 2 mg/dL (ref 1.7–2.4)

## 2023-03-13 MED ORDER — ASPIRIN 81 MG PO TBEC
81.0000 mg | DELAYED_RELEASE_TABLET | Freq: Every day | ORAL | Status: DC
  Administered 2023-03-13 – 2023-03-14 (×2): 81 mg via ORAL
  Filled 2023-03-13 (×2): qty 1

## 2023-03-13 MED ORDER — NITROGLYCERIN 0.4 MG SL SUBL: 0.4000 mg | SUBLINGUAL_TABLET | SUBLINGUAL | Status: DC | PRN

## 2023-03-13 MED ORDER — DILTIAZEM HCL 30 MG PO TABS: 30.0000 mg | ORAL_TABLET | Freq: Two times a day (BID) | ORAL | Status: DC

## 2023-03-13 MED ORDER — IPRATROPIUM-ALBUTEROL 0.5-2.5 (3) MG/3ML IN SOLN
3.0000 mL | Freq: Four times a day (QID) | RESPIRATORY_TRACT | Status: DC
  Administered 2023-03-13 – 2023-03-14 (×4): 3 mL via RESPIRATORY_TRACT
  Filled 2023-03-13 (×5): qty 3

## 2023-03-13 MED ORDER — BUDESONIDE 0.25 MG/2ML IN SUSP
0.2500 mg | Freq: Two times a day (BID) | RESPIRATORY_TRACT | Status: DC
  Administered 2023-03-13 – 2023-03-14 (×2): 0.25 mg via RESPIRATORY_TRACT
  Filled 2023-03-13 (×2): qty 2

## 2023-03-13 MED ORDER — DULOXETINE HCL 60 MG PO CPEP
120.0000 mg | ORAL_CAPSULE | Freq: Every day | ORAL | Status: DC
  Administered 2023-03-13 – 2023-03-14 (×2): 120 mg via ORAL
  Filled 2023-03-13 (×2): qty 2

## 2023-03-13 MED ORDER — GUAIFENESIN-DM 100-10 MG/5ML PO SYRP
10.0000 mL | ORAL_SOLUTION | Freq: Three times a day (TID) | ORAL | Status: DC
  Filled 2023-03-13: qty 10

## 2023-03-13 NOTE — Progress Notes (Signed)
PROGRESS NOTE    Alicia Sanchez  ZOX:[id number] DOB: Jun 13, 1961 DOA: 03/11/2023 PCP: [name], DO   Brief Narrative:    Alicia Sanchez is a 62 y.o. female with medical history significant of COPD on 2 LPM of oxygen as needed at baseline, tobacco abuse, hyperlipidemia, anxiety who presents to the emergency department via EMS due to shortness of breath.  She was admitted with acute COPD exacerbation as well as bronchitis and was started on IV steroids, breathing treatments, and azithromycin.  She is slowly improving with discharge anticipated in the next 24 hours.   Subjective: The patient was seen and examined this morning, still complain of congestion, cough, shortness of breath with moderate exertion, Addressed satting 97% on room air   Assessment & Plan:   Principal Problem:   Acute bronchitis Active Problems:   Tobacco abuse   Restless leg syndrome   Acute on chronic respiratory failure with hypoxia   COPD with acute exacerbation   Mixed hyperlipidemia   Low blood pressure  Assessment and Plan:  Acute bronchitis with possible superimposed COPD exacerbation Acute on chronic respiratory failure with hypoxia -Monitoring closely, still wheezing, congested Continue Duonebs-now as needed, Mucinex, Solu-Medrol, azithromycin. Continue Protonix to prevent steroid-induced ulcer Continue incentive spirometry and flutter valve Continue supplemental oxygen to maintain O2 sat > 92% with plan to wean patient off oxygen as tolerated  -WBC 25.3, 19.4, >>> 16.8,   Mixed hyperlipidemia Continue Lipitor   Low blood pressure-stable Continue midodrine -Holding home medication of Cardizem   Tobacco abuse Patient was counseled on tobacco abuse cessation Nicotine patch ordered   Restless leg syndrome Continue Requip  Depression -Continue as needed Xanax, continue home medication of Cymbalta   DVT prophylaxis:Lovenox Code Status: Full Family Communication: Daughter on  phone 4/23 Disposition Plan:  Status is: Observation The patient will require care spanning > 2 midnights and should be moved to inpatient because: Need for ongoing IV medications.  Consultants:  None  Procedures:  None  Antimicrobials:  Anti-infectives (From admission, onward)    Start     Dose/Rate Route Frequency Ordered Stop   03/13/23 1000  azithromycin (ZITHROMAX) tablet 250 mg       See Hyperspace for full Linked Orders Report.   250 mg Oral Daily 03/12/23 0001 03/17/23 0959   03/12/23 1000  azithromycin (ZITHROMAX) tablet 500 mg       See Hyperspace for full Linked Orders Report.   500 mg Oral Daily 03/12/23 0001 03/12/23 1114       .  Objective: Vitals:   03/12/23 2022 03/12/23 2125 03/13/23 0422 03/13/23 0926  BP:  107/68 104/63   Pulse:  92 75   Resp:  18 18   Temp:  98.3 F (36.8 C) 97.9 F (36.6 C)   TempSrc:      SpO2: 93% 96% 96% 97%  Weight:      Height:        Intake/Output Summary (Last 24 hours) at 03/13/2023 1130 Last data filed at 03/13/2023 0500 Gross per 24 hour  Intake 720 ml  Output --  Net 720 ml   Filed Weights   03/11/23 1605 03/12/23 0802  Weight: 50.5 kg 52.2 kg          General:  AAO x 3,  cooperative, no distress;   HEENT:  Normocephalic, PERRL, otherwise with in Normal limits   Neuro:  CNII-XII intact. , normal motor and sensation, reflexes intact   Lungs:   Diffuse Rales, mild diffuse  wheezing, Congestion,   Cardio:    S1/S2, RRR, No murmure, No Rubs or Gallops   Abdomen:  Soft, non-tender, bowel sounds active all four quadrants, no guarding or peritoneal signs.  Muscular  skeletal:  Limited exam -global generalized weaknesses - in bed, able to move all 4 extremities,   2+ pulses,  symmetric, No pitting edema  Skin:  Dry, warm to touch, negative for any Rashes,  Wounds: Please see nursing documentation              Data Reviewed: I have personally reviewed following labs and imaging  studies  CBC: Recent Labs  Lab 03/11/23 1615 03/12/23 0330 03/13/23 0351  WBC 25.3* 19.4* 16.8*  HGB 15.1* 13.5 12.7  HCT 43.7 40.9 37.7  MCV 90.7 93.2 92.0  PLT 317 329 341   Basic Metabolic Panel: Recent Labs  Lab 03/11/23 1615 03/12/23 0330 03/13/23 0351  NA 133* 135 137  K 4.0 3.9 3.7  CL 98 101 103  CO2 GLUCOSE 143* 161* 110*  BUN CREATININE 0.76 0.83 0.74  CALCIUM 9.0 8.7* 8.5*  MG  --  1.9 2.0  PHOS  --  2.7  --    GFR: Estimated Creatinine Clearance: 60.1 mL/min (by C-G formula based on SCr of 0.74 mg/dL). Liver Function Tests: Recent Labs  Lab 03/12/23 0330  AST 25  ALT 15  ALKPHOS 57  BILITOT 0.6  PROT 6.6  ALBUMIN 3.4*        Radiology Studies: DG Chest Port 1 View  Result Date: 03/11/2023 CLINICAL DATA:  Shortness of breath. Hypoxia. EXAM: PORTABLE CHEST 1 VIEW COMPARISON:  Radiograph 4 hours ago FINDINGS: The heart is normal in size. Stable mediastinal contours. Ill-defined opacity in both infrahilar regions and lung bases without significant interval change from earlier today. Background emphysema. No pneumothorax or developing effusion. No acute osseous findings. IMPRESSION: No significant change in ill-defined bibasilar and infrahilar opacities since earlier today, atelectasis versus infection. Electronically Signed   By: Narda Rutherford M.D.   On: 03/11/2023 16:28   DG Chest 2 View  Result Date: 03/11/2023 CLINICAL DATA:  COPD exacerbation.  Right-sided chest pain. EXAM: CHEST - 2 VIEW COMPARISON:  02/04/2023 FINDINGS: Heart size remains normal. Mediastinal shadows are normal. There are worsened pulmonary markings in the mid and lower lungs that could go along with bronchitis, mild patchy pneumonia, bronchiectasis and mucoid impaction. No pleural effusion. No lobar consolidation or collapse. No acute bone finding. IMPRESSION: Worsened pulmonary markings in the mid and lower lungs that could go along with bronchitis, mild  patchy pneumonia, bronchiectasis and mucoid impaction. Electronically Signed   By: Paulina Fusi M.D.   On: 03/11/2023 12:17        Scheduled Meds:  ALPRAZolam  0.5 mg Oral TID   aspirin EC  81 mg Oral Daily   atorvastatin  40 mg Oral Daily   azithromycin  250 mg Oral Daily   dextromethorphan-guaiFENesin  1 tablet Oral BID   diltiazem  30 mg Oral BID   DULoxetine  120 mg Oral Daily   methylPREDNISolone (SOLU-MEDROL) injection  40 mg Intravenous Q12H   nicotine  7 mg Transdermal Daily   pantoprazole  40 mg Oral Daily   rOPINIRole  1 mg Oral QHS     LOS: 1 day    Time spent: 35 minutes    Kendell Bane, MD [facility]  If 7PM-7AM, please contact night-coverage [email or web address] 03/13/2023, 11:30 AM

## 2023-03-13 NOTE — Progress Notes (Signed)
NO acute events overnight. Karl Ito, RN

## 2023-03-14 DIAGNOSIS — J209 Acute bronchitis, unspecified: Secondary | ICD-10-CM | POA: Diagnosis not present

## 2023-03-14 LAB — CBC
HCT: 39.8 % (ref 36.0–46.0)
Hemoglobin: 13.3 g/dL (ref 12.0–15.0)
MCH: 30.9 pg (ref 26.0–34.0)
MCHC: 33.4 g/dL (ref 30.0–36.0)
MCV: 92.3 fL (ref 80.0–100.0)
Platelets: 373 10*3/uL (ref 150–400)
RBC: 4.31 MIL/uL (ref 3.87–5.11)
RDW: 12.7 % (ref 11.5–15.5)
WBC: 9.6 10*3/uL (ref 4.0–10.5)
nRBC: 0 % (ref 0.0–0.2)

## 2023-03-14 LAB — PROCALCITONIN: Procalcitonin: <0.1 ng/mL

## 2023-03-14 MED ORDER — GUAIFENESIN ER 600 MG PO TB12
600.0000 mg | ORAL_TABLET | Freq: Two times a day (BID) | ORAL | Status: DC
  Administered 2023-03-14: 600 mg via ORAL
  Filled 2023-03-14: qty 1

## 2023-03-14 MED ORDER — AZITHROMYCIN 500 MG PO TABS: 500.0000 mg | ORAL_TABLET | Freq: Every day | ORAL | 0 refills | Status: AC

## 2023-03-14 MED ORDER — METHYLPREDNISOLONE 4 MG PO TBPK: ORAL_TABLET | ORAL | 0 refills | Status: DC

## 2023-03-14 MED ORDER — NICOTINE 7 MG/24HR TD PT24: 7.0000 mg | MEDICATED_PATCH | Freq: Every day | TRANSDERMAL | 0 refills | Status: DC

## 2023-03-14 NOTE — Care Management Important Message (Signed)
Important Message  Patient Details  Name: Alicia Sanchez MRN: [id number] Date of Birth: [birth year]   Medicare Important Message Given:  N/A - LOS <3 / Initial given by admissions     Corey Harold 03/14/2023, 10:35 AM

## 2023-03-14 NOTE — Discharge Summary (Signed)
Physician Discharge Summary   Patient: Alicia Sanchez MRN: [id number] DOB: [birth year]  Admit date:     03/11/2023  Discharge date: 03/14/23  Discharge Physician: Kendell Bane   PCP: [name], DO   Recommendations at discharge:  Follow-up with PCP within 1-2 weeks Follow with a pulmonologist in 2-4 weeks Avoid tobacco use products  Discharge Diagnoses: Principal Problem:   Acute bronchitis Active Problems:   Tobacco abuse   Restless leg syndrome   Acute on chronic respiratory failure with hypoxia   COPD with acute exacerbation   Mixed hyperlipidemia   Low blood pressure  Resolved Problems:   * No resolved hospital problems. *  Brief Narrative:    Alicia Sanchez is a 62 y.o. female with medical history significant of COPD on 2 LPM of oxygen as needed at baseline, tobacco abuse, hyperlipidemia, anxiety who presents to the emergency department via EMS due to shortness of breath.  She was admitted with acute COPD exacerbation as well as bronchitis and was started on IV steroids, breathing treatments, and azithromycin.  She is slowly improving with discharge anticipated in the next 24 hours.        Acute bronchitis with possible superimposed COPD exacerbation Acute on chronic respiratory failure with hypoxia -Improved wheezing, improved shortness of breath, -Continue p.o. antibiotics azithromycin 3 more days, prednisone taper, -To avoid tobacco and tobacco products -Continue use of incentive spirometer and flutter valve -WBC 25.3, 19.4, >>> 16.8 >>> 9.5   Mixed hyperlipidemia Continue Lipitor   Low blood pressure-stable Continue midodrine -D/Ced Cardizem   Tobacco abuse Patient was counseled on tobacco abuse cessation Nicotine patch ordered   Restless leg syndrome Continue Requip   Depression -Continue as needed Xanax, continue home medication of Cymbalta    Code Status: Full   Consultants:  None   Procedures:  None  Disposition:  Home Diet recommendation:  Discharge Diet Orders (From admission, onward)     Start     Ordered   03/14/23 0000  Diet - low sodium heart healthy        03/14/23 1028           Regular diet DISCHARGE MEDICATION: Allergies as of 03/14/2023       Reactions   Doxycycline    Chest pain        Medication List     STOP taking these medications    Breztri Aerosphere 160-9-4.8 MCG/ACT Aero Generic drug: Budeson-Glycopyrrol-Formoterol   busPIRone 30 MG tablet Commonly known as: BUSPAR   diltiazem 30 MG tablet Commonly known as: CARDIZEM   gabapentin 300 MG capsule Commonly known as: NEURONTIN   hydrOXYzine 50 MG tablet Commonly known as: ATARAX   levofloxacin 500 MG tablet Commonly known as: LEVAQUIN   predniSONE 10 MG tablet Commonly known as: DELTASONE       TAKE these medications    albuterol 108 (90 Base) MCG/ACT inhaler Commonly known as: VENTOLIN HFA INHALE 2 PUFFS INTO THE LUNGS EVERY 6 (SIX) HOURS AS NEEDED FOR WHEEZING OR SHORTNESS OF BREATH.   alendronate 70 MG tablet Commonly known as: FOSAMAX TAKE 1 TABLET EVERY 7 DAYS WITH A FULL GLASS OF WATER ON AN EMPTY STOMACH What changed: See the new instructions.   ALPRAZolam 0.5 MG tablet Commonly known as: XANAX TAKE 1 TABLET BY MOUTH THREE TIMES DAILY AND  1  EXTRA  PRN  FOR  INCREASED  ANXIETY What changed:  how much to take how to take this when to take this reasons  to take this   aspirin EC 81 MG tablet Take 81 mg by mouth daily.   atorvastatin 40 MG tablet Commonly known as: LIPITOR TAKE 1 TABLET EVERY DAY (NEED MD APPOINTMENT FOR REFILLS) What changed:  how much to take how to take this when to take this additional instructions   azithromycin 500 MG tablet Commonly known as: Zithromax Take 1 tablet (500 mg total) by mouth daily for 3 days. Take 1 tablet daily for 3 days.   DULoxetine 60 MG capsule Commonly known as: CYMBALTA TAKE 2 CAPSULES (120 MG TOTAL) EVERY DAY What  changed: See the new instructions.   ipratropium-albuterol 0.5-2.5 (3) MG/3ML Soln Commonly known as: DUONEB INHALE THE CONTENTS OF 1 VIAL VIA NEBULIZER EVERY 6 HOURS AS NEEDED What changed: See the new instructions.   meloxicam 7.5 MG tablet Commonly known as: MOBIC Take 1 tablet (7.5 mg total) by mouth daily.   methylPREDNISolone 4 MG Tbpk tablet Commonly known as: MEDROL DOSEPAK Medrol Dosepak take as instructed   midodrine 2.5 MG tablet Commonly known as: PROAMATINE Take 1 tablet (2.5 mg total) by mouth 3 (three) times daily as needed (blood pressure less than 90/60).   Nebulizer/Tubing/Mouthpiece Kit 1 Units by Does not apply route 4 (four) times daily. GIVE face mask and tubing for nebulizer. J44.1   nicotine 7 mg/24hr patch Commonly known as: NICODERM CQ - dosed in mg/24 hr Place 1 patch (7 mg total) onto the skin daily. Start taking on: March 15, 2023   nitroGLYCERIN 0.4 MG SL tablet Commonly known as: NITROSTAT Place 1 tablet (0.4 mg total) under the tongue every 5 (five) minutes x 3 doses as needed for chest pain (if o relief after 2nd dose, proceed to the ED for an evaluation or call 911).   rOPINIRole 1 MG tablet Commonly known as: REQUIP TAKE 1-4 TABLETS BY MOUTH AT BEDTIME. What changed: See the new instructions.        Discharge Exam: Filed Weights   03/11/23 1605 03/12/23 0802  Weight: 50.5 kg 52.2 kg        General:  AAO x 3,  cooperative, no distress;   HEENT:  Normocephalic, PERRL, otherwise with in Normal limits   Neuro:  CNII-XII intact. , normal motor and sensation, reflexes intact   Lungs:   Clear to auscultation BL, Respirations unlabored,  + wheezes / no crackles  Cardio:    S1/S2, RRR, No murmure, No Rubs or Gallops   Abdomen:  Soft, non-tender, bowel sounds active all four quadrants, no guarding or peritoneal signs.  Muscular  skeletal:  Limited exam -global generalized weaknesses - in bed, able to move all 4 extremities,   2+  pulses,  symmetric, No pitting edema  Skin:  Dry, warm to touch, negative for any Rashes,  Wounds: Please see nursing documentation          Condition at discharge: fair  The results of significant diagnostics from this hospitalization (including imaging, microbiology, ancillary and laboratory) are listed below for reference.   Imaging Studies: DG Chest Port 1 View  Result Date: 03/11/2023 CLINICAL DATA:  Shortness of breath. Hypoxia. EXAM: PORTABLE CHEST 1 VIEW COMPARISON:  Radiograph 4 hours ago FINDINGS: The heart is normal in size. Stable mediastinal contours. Ill-defined opacity in both infrahilar regions and lung bases without significant interval change from earlier today. Background emphysema. No pneumothorax or developing effusion. No acute osseous findings. IMPRESSION: No significant change in ill-defined bibasilar and infrahilar opacities since earlier today, atelectasis versus  infection. Electronically Signed   By: Narda Rutherford M.D.   On: 03/11/2023 16:28   DG Chest 2 View  Result Date: 03/11/2023 CLINICAL DATA:  COPD exacerbation.  Right-sided chest pain. EXAM: CHEST - 2 VIEW COMPARISON:  02/04/2023 FINDINGS: Heart size remains normal. Mediastinal shadows are normal. There are worsened pulmonary markings in the mid and lower lungs that could go along with bronchitis, mild patchy pneumonia, bronchiectasis and mucoid impaction. No pleural effusion. No lobar consolidation or collapse. No acute bone finding. IMPRESSION: Worsened pulmonary markings in the mid and lower lungs that could go along with bronchitis, mild patchy pneumonia, bronchiectasis and mucoid impaction. Electronically Signed   By: Paulina Fusi M.D.   On: 03/11/2023 12:17   ECHOCARDIOGRAM COMPLETE  Result Date: 02/19/2023    ECHOCARDIOGRAM REPORT   Patient Name:   SHEVETTE BESS Date of Exam: 02/18/2023 Medical Rec #:  [id number]      Height:       64.5 in Accession #:    [id number]     Weight:       112.5 lb Date of  Birth:  1961/03/16      BSA:          1.540 m Patient Age:    61 years       BP:           120/77 mmHg Patient Gender: F              HR:           105 bpm. Exam Location:  [facility] Procedure: 2D Echo, Color Doppler and Limited Color Doppler Indications:    Syncope, unspecified syncope type [R55 (ICD-10-CM)]; Medication                 management [phone] (ICD-10-CM)]  History:        Patient has prior history of Echocardiogram examinations, most                 recent 07/11/2018. CAD, COPD, Signs/Symptoms:Syncope, Shortness                 of Breath and Hypotension; Risk Factors:Current Smoker.  Sonographer:    Jake Seats RDMS, RVT, RDCS Referring Phys: [id number] [facility]  Sonographer Comments: Technically difficult study due to poor echo windows. Image acquisition challenging due to COPD. Global longitudinal strain was attempted. IMPRESSIONS  1. Left ventricular ejection fraction, by estimation, is >75%. The left ventricle has hyperdynamic function. The left ventricle has no regional wall motion abnormalities. There is mild left ventricular hypertrophy. Left ventricular diastolic parameters are consistent with Grade I diastolic dysfunction (impaired relaxation).  2. Right ventricular systolic function is normal. The right ventricular size is normal.  3. The mitral valve is normal in structure. No evidence of mitral valve regurgitation. No evidence of mitral stenosis.  4. The aortic valve was not well visualized. Aortic valve regurgitation is not visualized.  5. The inferior vena cava is normal in size with greater than 50% respiratory variability, suggesting right atrial pressure of 3 mmHg. Comparison(s): Nuclear stress test done 08/03/21 showed an EF of 80%. Echocardiogram done 07/11/18 showed an EF of 60-65%. FINDINGS  Left Ventricle: Left ventricular ejection fraction, by estimation, is >75%. The left ventricle has hyperdynamic function. The left ventricle has no regional wall motion abnormalities. Global  longitudinal strain performed but not reported based on interpreter judgement due to suboptimal tracking. The left ventricular internal cavity size was normal in size. There is mild left  ventricular hypertrophy. Left ventricular diastolic parameters are consistent with Grade I diastolic dysfunction (impaired relaxation). Normal left ventricular filling pressure. Right Ventricle: The right ventricular size is normal. Right vetricular wall thickness was not well visualized. Right ventricular systolic function is normal. Left Atrium: Left atrial size was normal in size. Right Atrium: Right atrial size was normal in size. Pericardium: There is no evidence of pericardial effusion. Mitral Valve: The mitral valve is normal in structure. No evidence of mitral valve regurgitation. No evidence of mitral valve stenosis. Tricuspid Valve: The tricuspid valve is normal in structure. Tricuspid valve regurgitation is not demonstrated. No evidence of tricuspid stenosis. Aortic Valve: The aortic valve was not well visualized. Aortic valve regurgitation is not visualized. Aortic valve mean gradient measures 2.5 mmHg. Aortic valve peak gradient measures 5.7 mmHg. Aortic valve area, by VTI measures 2.13 cm. Pulmonic Valve: The pulmonic valve was not well visualized. Pulmonic valve regurgitation is not visualized. No evidence of pulmonic stenosis. Aorta: The aortic root is normal in size and structure. Venous: The inferior vena cava is normal in size with greater than 50% respiratory variability, suggesting right atrial pressure of 3 mmHg. IAS/Shunts: No atrial level shunt detected by color flow Doppler.  LEFT VENTRICLE PLAX 2D LVIDd:         2.50 cm     Diastology LVIDs:         1.50 cm     LV e' medial:    7.83 cm/s LV PW:         1.20 cm     LV E/e' medial:  7.6 LV IVS:        1.10 cm     LV e' lateral:   5.77 cm/s LVOT diam:     1.70 cm     LV E/e' lateral: 10.3 LV SV:         41 LV SV Index:   27 LVOT Area:     2.27 cm  LV Volumes  (MOD) LV vol d, MOD A2C: 25.4 ml LV vol d, MOD A4C: 29.6 ml LV vol s, MOD A2C: 6.3 ml LV vol s, MOD A4C: 8.0 ml LV SV MOD A2C:     19.1 ml LV SV MOD A4C:     29.6 ml LV SV MOD BP:      20.6 ml RIGHT VENTRICLE RV S prime:     9.36 cm/s TAPSE (M-mode): 1.5 cm LEFT ATRIUM           Index       RIGHT ATRIUM          Index LA diam:      1.70 cm 1.10 cm/m  RA Area:     4.72 cm LA Vol (A2C): 10.2 ml 6.62 ml/m  RA Volume:   6.82 ml  4.43 ml/m LA Vol (A4C): 14.1 ml 9.15 ml/m  AORTIC VALVE AV Area (Vmax):    2.19 cm AV Area (Vmean):   2.52 cm AV Area (VTI):     2.13 cm AV Vmax:           119.01 cm/s AV Vmean:          72.579 cm/s AV VTI:            0.192 m AV Peak Grad:      5.7 mmHg AV Mean Grad:      2.5 mmHg LVOT Vmax:         115.00 cm/s LVOT Vmean:  80.500 cm/s LVOT VTI:          0.180 m LVOT/AV VTI ratio: 0.94  AORTA Ao Root diam: 2.70 cm MITRAL VALVE               TRICUSPID VALVE MV Area (PHT): 4.57 cm    TR Peak grad:   20.2 mmHg MV Decel Time: 166 msec    TR Vmax:        225.00 cm/s MV E velocity: 59.40 cm/s MV A velocity: 75.60 cm/s  SHUNTS MV E/A ratio:  0.79        Systemic VTI:  0.18 m                            Systemic Diam: 1.70 cm Dina Rich MD Electronically signed by Dina Rich MD Signature Date/Time: 02/19/2023/9:49:08 AM    Final    NM Myocar Multi W/Spect Izetta Dakin Motion / EF  Result Date: 02/12/2023   The study is normal. The study is low risk.   No ST deviation was noted. The ECG was negative for ischemia.   LV perfusion is normal.  No significant myocardial perfusion defects to indicate scar or ischemia.   Left ventricular function is normal. Nuclear stress EF: 85 %. Low risk study with no significant myocardial perfusion defects to indicate scar or ischemia.  LVEF 85%.    Microbiology: Results for orders placed or performed during the hospital encounter of 03/11/23  Respiratory (~20 pathogens) panel by PCR     Status: None   Collection Time: 03/13/23 12:40 PM    Specimen: Nasopharyngeal Swab; Respiratory  Result Value Ref Range Status   Adenovirus NOT DETECTED NOT DETECTED Final   Coronavirus 229E NOT DETECTED NOT DETECTED Final    Comment: (NOTE) The Coronavirus on the Respiratory Panel, DOES NOT test for the novel  Coronavirus (2019 nCoV)    Coronavirus HKU1 NOT DETECTED NOT DETECTED Final   Coronavirus NL63 NOT DETECTED NOT DETECTED Final   Coronavirus OC43 NOT DETECTED NOT DETECTED Final   Metapneumovirus NOT DETECTED NOT DETECTED Final   Rhinovirus / Enterovirus NOT DETECTED NOT DETECTED Final   Influenza A NOT DETECTED NOT DETECTED Final   Influenza B NOT DETECTED NOT DETECTED Final   Parainfluenza Virus 1 NOT DETECTED NOT DETECTED Final   Parainfluenza Virus 2 NOT DETECTED NOT DETECTED Final   Parainfluenza Virus 3 NOT DETECTED NOT DETECTED Final   Parainfluenza Virus 4 NOT DETECTED NOT DETECTED Final   Respiratory Syncytial Virus NOT DETECTED NOT DETECTED Final   Bordetella pertussis NOT DETECTED NOT DETECTED Final   Bordetella Parapertussis NOT DETECTED NOT DETECTED Final   Chlamydophila pneumoniae NOT DETECTED NOT DETECTED Final   Mycoplasma pneumoniae NOT DETECTED NOT DETECTED Final    Comment: Performed at [facility], [address], [place], [place] [address]    Labs: CBC: Recent Labs  Lab 03/11/23 1615 03/12/23 0330 03/13/23 0351 03/14/23 0518  WBC 25.3* 19.4* 16.8* 9.6  HGB 15.1* 13.5 12.7 13.3  HCT 43.7 40.9 37.7 39.8  MCV 90.7 93.2 92.0 92.3  PLT 317 329 341 373   Basic Metabolic Panel: Recent Labs  Lab 03/11/23 1615 03/12/23 0330 03/13/23 0351  NA 133* 135 137  K 4.0 3.9 3.7  CL 98 101 103  CO2 24 24 26   GLUCOSE 143* 161* 110*  BUN 13 17 14   CREATININE 0.76 0.83 0.74  CALCIUM 9.0 8.7* 8.5*  MG  --  1.9 2.0  PHOS  --  2.7  --    Liver Function Tests: Recent Labs  Lab 03/12/23 0330  AST 25  ALT 15  ALKPHOS 57  BILITOT 0.6  PROT 6.6  ALBUMIN 3.4*   CBG: No results for input(s):  "GLUCAP" in the last 168 hours.  Discharge time spent: greater than 30 minutes.  Signed: Kendell Bane, MD [facility] 03/14/2023

## 2023-03-15 ENCOUNTER — Telehealth: Payer: Self-pay

## 2023-03-15 ENCOUNTER — Ambulatory Visit: Payer: Self-pay

## 2023-03-15 NOTE — Chronic Care Management (AMB) (Signed)
   03/15/2023  [name] 1961/03/22 [id number]   Reason for Encounter: Patient is not currently enrolled in the CCM program. CCM status changed to previously enrolled  Alto Denver RN, MSN, CCM RN Care Manager  Chronic Care Management Direct Number: [phone]

## 2023-03-15 NOTE — Transitions of Care (Post Inpatient/ED Visit) (Signed)
   03/15/2023  Name: Alicia Sanchez MRN: [id number] DOB: 22-Jun-1961  Today's TOC FU Call Status: Today's TOC FU Call Status:: Unsuccessul Call (1st Attempt) Unsuccessful Call (1st Attempt) Date: 03/15/23  Attempted to reach the patient regarding the most recent Inpatient/ED visit.  Follow Up Plan: Additional outreach attempts will be made to reach the patient to complete the Transitions of Care (Post Inpatient/ED visit) call.      Antionette Fairy, RN,BSN,CCM [facility] Care Management Care Management Community Coordinator Direct Phone: [phone] Toll Free: [phone] Fax: [phone]

## 2023-03-16 ENCOUNTER — Other Ambulatory Visit: Payer: Self-pay | Admitting: Family Medicine

## 2023-03-16 DIAGNOSIS — F331 Major depressive disorder, recurrent, moderate: Secondary | ICD-10-CM

## 2023-03-18 ENCOUNTER — Telehealth: Payer: Self-pay

## 2023-03-18 NOTE — Transitions of Care (Post Inpatient/ED Visit) (Signed)
   03/18/2023  Name: Alicia Sanchez MRN: [id number] DOB: 1961-09-04  Today's TOC FU Call Status: Today's TOC FU Call Status:: Successful TOC FU Call Competed TOC FU Call Complete Date: 03/18/23  Transition Care Management Follow-up Telephone Call Date of Discharge: 03/14/23 [facility] Primary Inpatient Discharge Diagnosis:: "COPD exacerbation" How have you been since you were released from the hospital?: Better (pt states she is "feeling better-breathing is back to normal." She reports only one SOB episode since coming home and it was related  to her being up,moving around and "doing a little too much." It was easily relieved with rest and her using her meds.) Any questions or concerns?: No  Items Reviewed: Did you receive and understand the discharge instructions provided?: Yes Medications obtained and verified?: Yes (Medications Reviewed) Any new allergies since your discharge?: No Dietary orders reviewed?: Yes Type of Diet Ordered:: low salt/heart healthy Do you have support at home?: Yes People in Home: alone Name of Support/Comfort Primary Source: pt states she is currently staying with her daughter while she recovers  Home Care and Equipment/Supplies: Were Home Health Services Ordered?: NA Any new equipment or medical supplies ordered?: NA  Functional Questionnaire: Do you need assistance with bathing/showering or dressing?: No Do you need assistance with meal preparation?: No Do you need assistance with eating?: No Do you have difficulty maintaining continence: No Do you need assistance with getting out of bed/getting out of a chair/moving?: No Do you have difficulty managing or taking your medications?: No  Follow up appointments reviewed: PCP Follow-up appointment confirmed?: Yes Date of PCP follow-up appointment?: 03/27/23 Follow-up Provider: Dr. Nadine Counts Specialist [facility] Follow-up appointment confirmed?: No Reason Specialist Follow-Up  Not Confirmed: Patient has Specialist Provider Number and will Call for Appointment Do you need transportation to your follow-up appointment?: No (pt states she is able to drive herself to appts) Do you understand care options if your condition(s) worsen?: Yes-patient verbalized understanding  SDOH Interventions Today    Flowsheet Row Most Recent Value  SDOH Interventions   Food Insecurity Interventions Intervention Not Indicated  Transportation Interventions Intervention Not Indicated       TOC Interventions Today    Flowsheet Row Most Recent Value  TOC Interventions   TOC Interventions Discussed/Reviewed TOC Interventions Discussed      Interventions Today    Flowsheet Row Most Recent Value  General Interventions   General Interventions Discussed/Reviewed General Interventions Discussed, Doctor Visits  Doctor Visits Discussed/Reviewed Doctor Visits Discussed, PCP, Specialist  PCP/Specialist Visits Compliance with follow-up visit  Education Interventions   Education Provided Provided Education  Provided Verbal Education On Nutrition, When to see the doctor, Medication  Nutrition Interventions   Nutrition Discussed/Reviewed Nutrition Discussed, Adding fruits and vegetables, Decreasing salt  Pharmacy Interventions   Pharmacy Dicussed/Reviewed Pharmacy Topics Discussed, Medications and their functions  Safety Interventions   Safety Discussed/Reviewed Safety Discussed       Alicia Sanchez [facility] Care Management Care Management Community Coordinator Direct Phone: [phone] Toll Free: [phone] Fax: [phone]

## 2023-03-19 ENCOUNTER — Other Ambulatory Visit: Payer: Self-pay | Admitting: *Deleted

## 2023-03-25 ENCOUNTER — Ambulatory Visit (INDEPENDENT_AMBULATORY_CARE_PROVIDER_SITE_OTHER): Payer: Medicare HMO | Admitting: Family Medicine

## 2023-03-25 ENCOUNTER — Ambulatory Visit (INDEPENDENT_AMBULATORY_CARE_PROVIDER_SITE_OTHER): Payer: Medicare HMO

## 2023-03-25 ENCOUNTER — Encounter: Payer: Self-pay | Admitting: Family Medicine

## 2023-03-25 VITALS — BP 104/71 | HR 53 | Temp 98.6°F | Ht 64.0 in | Wt 112.0 lb

## 2023-03-25 DIAGNOSIS — J432 Centrilobular emphysema: Secondary | ICD-10-CM | POA: Diagnosis not present

## 2023-03-25 DIAGNOSIS — R0602 Shortness of breath: Secondary | ICD-10-CM

## 2023-03-25 DIAGNOSIS — Z09 Encounter for follow-up examination after completed treatment for conditions other than malignant neoplasm: Secondary | ICD-10-CM | POA: Diagnosis not present

## 2023-03-25 DIAGNOSIS — J441 Chronic obstructive pulmonary disease with (acute) exacerbation: Secondary | ICD-10-CM

## 2023-03-25 DIAGNOSIS — R0600 Dyspnea, unspecified: Secondary | ICD-10-CM | POA: Diagnosis not present

## 2023-03-25 MED ORDER — BREZTRI AEROSPHERE 160-9-4.8 MCG/ACT IN AERO: 2.0000 | INHALATION_SPRAY | Freq: Two times a day (BID) | RESPIRATORY_TRACT | 11 refills | Status: DC

## 2023-03-25 MED ORDER — ALBUTEROL SULFATE (2.5 MG/3ML) 0.083% IN NEBU
2.5000 mg | INHALATION_SOLUTION | Freq: Once | RESPIRATORY_TRACT | Status: AC
Start: 2023-03-25 — End: 2023-03-25
  Administered 2023-03-25: 2.5 mg via RESPIRATORY_TRACT

## 2023-03-25 MED ORDER — IPRATROPIUM BROMIDE 0.02 % IN SOLN
0.5000 mg | Freq: Once | RESPIRATORY_TRACT | Status: AC
Start: 2023-03-25 — End: 2023-03-25
  Administered 2023-03-25: 0.5 mg via RESPIRATORY_TRACT

## 2023-03-25 NOTE — Progress Notes (Signed)
Subjective: ZO:[id number] follow up PCP: [name], DO UJW:[id number] Howser is a 62 y.o. female presenting to clinic today for:  1. COPD exacerbation Patient was admitted to [facility] for COPD exacerbation.  She had an O2 need of 2L via [place] (which is typically prn), leukocytosis noted. She reports ongoing DOE and some SOB at rest.  She was discharged with instructions to discontinue Breztri totally but was not given any replacements.  She has been utilizing her DuoNeb as needed.  She has an appointment with pulmonology at the end of the month and cardiology tomorrow.  She has been doing well off of the diltiazem and currently only takes midodrine if needed for blood pressures less than 90/60.  No syncopal or presyncopal episodes as of late.  No hemoptysis or fevers reported.  She has had temperatures no higher than 99 F at home.   ROS: Per HPI  Allergies  Allergen Reactions   Doxycycline     Chest pain   Past Medical History:  Diagnosis Date   Anxiety    Aortic atherosclerosis (HCC)    COPD (chronic obstructive pulmonary disease) (HCC)    Depression    History of chest pain    History of syncope    Hypotension    Osteoporosis    Palpitations    Panic attacks    Restless legs syndrome (RLS)     Current Outpatient Medications:    albuterol (VENTOLIN HFA) 108 (90 Base) MCG/ACT inhaler, INHALE 2 PUFFS INTO THE LUNGS EVERY 6 (SIX) HOURS AS NEEDED FOR WHEEZING OR SHORTNESS OF BREATH., Disp: 3 each, Rfl: 3   alendronate (FOSAMAX) 70 MG tablet, TAKE 1 TABLET EVERY 7 DAYS WITH A FULL GLASS OF WATER ON AN EMPTY STOMACH (Patient taking differently: Take 70 mg by mouth once a week.), Disp: 12 tablet, Rfl: 0   ALPRAZolam (XANAX) 0.5 MG tablet, TAKE 1 TABLET BY MOUTH THREE TIMES DAILY AND  1  EXTRA  PRN  FOR  INCREASED  ANXIETY (Patient taking differently: Take 0.5 mg by mouth 4 (four) times daily as needed for anxiety. TAKE 1 TABLET BY MOUTH THREE TIMES DAILY AND  1  EXTRA  PRN  FOR   INCREASED  ANXIETY), Disp: 120 tablet, Rfl: 2   aspirin EC 81 MG tablet, Take 81 mg by mouth daily., Disp: , Rfl:    atorvastatin (LIPITOR) 40 MG tablet, TAKE 1 TABLET EVERY DAY (NEED MD APPOINTMENT FOR REFILLS) (Patient taking differently: Take 40 mg by mouth daily.), Disp: 90 tablet, Rfl: 3   DULoxetine (CYMBALTA) 60 MG capsule, TAKE 2 CAPSULES EVERY DAY, Disp: 180 capsule, Rfl: 0   ipratropium-albuterol (DUONEB) 0.5-2.5 (3) MG/3ML SOLN, INHALE THE CONTENTS OF 1 VIAL VIA NEBULIZER EVERY 6 HOURS AS NEEDED (Patient taking differently: Inhale 3 mLs into the lungs every 6 (six) hours as needed (Wheezing/SOB).), Disp: 360 mL, Rfl: 11   meloxicam (MOBIC) 7.5 MG tablet, Take 1 tablet (7.5 mg total) by mouth daily., Disp: 90 tablet, Rfl: 3   methylPREDNISolone (MEDROL DOSEPAK) 4 MG TBPK tablet, Medrol Dosepak take as instructed, Disp: 21 tablet, Rfl: 0   midodrine (PROAMATINE) 2.5 MG tablet, Take 1 tablet (2.5 mg total) by mouth 3 (three) times daily as needed (blood pressure less than 90/60)., Disp: 90 tablet, Rfl: 0   nicotine (NICODERM CQ - DOSED IN MG/24 HR) 7 mg/24hr patch, Place 1 patch (7 mg total) onto the skin daily. (Patient not taking: Reported on 03/18/2023), Disp: 28 patch, Rfl: 0  nitroGLYCERIN (NITROSTAT) 0.4 MG SL tablet, Place 1 tablet (0.4 mg total) under the tongue every 5 (five) minutes x 3 doses as needed for chest pain (if o relief after 2nd dose, proceed to the ED for an evaluation or call 911)., Disp: 25 tablet, Rfl: 2   Respiratory Therapy Supplies (NEBULIZER/TUBING/MOUTHPIECE) KIT, 1 Units by Does not apply route 4 (four) times daily. GIVE face mask and tubing for nebulizer. J44.1, Disp: 1 kit, Rfl: 0   rOPINIRole (REQUIP) 1 MG tablet, TAKE 1-4 TABLETS BY MOUTH AT BEDTIME. (Patient taking differently: Take 1-4 mg by mouth at bedtime.), Disp: 360 tablet, Rfl: 0 Social History   Socioeconomic History   Marital status: Legally Separated    Spouse name: Not on file   Number of  children: Not on file   Years of education: Not on file   Highest education level: Not on file  Occupational History   Not on file  Tobacco Use   Smoking status: Every Day    Packs/day: 0.50    Years: 42.00    Additional pack years: 0.00    Total pack years: 21.00    Types: Cigarettes   Smokeless tobacco: Never   Tobacco comments:    1/2 pack or less   Vaping Use   Vaping Use: Every day  Substance and Sexual Activity   Alcohol use: No   Drug use: Yes    Types: Marijuana   Sexual activity: Yes    Birth control/protection: Surgical  Other Topics Concern   Not on file  Social History Narrative   Not on file   Social Determinants of Health   Financial Resource Strain: Low Risk  (12/20/2022)   Overall Financial Resource Strain (CARDIA)    Difficulty of Paying Living Expenses: Not hard at all  Food Insecurity: No Food Insecurity (03/18/2023)   Hunger Vital Sign    Worried About Running Out of Food in the Last Year: Never true    Ran Out of Food in the Last Year: Never true  Transportation Needs: No Transportation Needs (03/18/2023)   PRAPARE - Administrator, Civil Service (Medical): No    Lack of Transportation (Non-Medical): No  Physical Activity: Inactive (12/20/2022)   Exercise Vital Sign    Days of Exercise per Week: 0 days    Minutes of Exercise per Session: 0 min  Stress: No Stress Concern Present (12/20/2022)   [employer] of Occupational Health - Occupational Stress Questionnaire    Feeling of Stress : Not at all  Social Connections: Moderately Isolated (12/20/2022)   Social Connection and Isolation Panel [NHANES]    Frequency of Communication with Friends and Family: More than three times a week    Frequency of Social Gatherings with Friends and Family: More than three times a week    Attends Religious Services: More than 4 times per year    Active Member of [employer] or Organizations: No    Attends Banker Meetings: Never    Marital Status:  Separated  Intimate Partner Violence: Not At Risk (03/12/2023)   Humiliation, Afraid, Rape, and Kick questionnaire    Fear of Current or Ex-Partner: No    Emotionally Abused: No    Physically Abused: No    Sexually Abused: No   Family History  Problem Relation Age of Onset   COPD Mother    COPD Father    Diabetes type II Sister    Kidney failure Sister    COPD Maternal Grandfather  Diabetes type II Brother    Breast cancer Neg Hx     Objective: Office vital signs reviewed. BP 104/71   Pulse (!) 53   Temp 98.6 F (37 C)   Ht 5\' 4"  (1.626 m)   Wt 112 lb (50.8 kg)   SpO2 96%   BMI 19.22 kg/m   Physical Examination:  General: Awake, alert, nontoxic female, No acute distress HEENT: sclera white, MMM Cardio: regular rate and rhythm, S1S2 heard, no murmurs appreciated Pulm: Globally decreased breath sounds with no wheezes, rhonchi or rales appreciated.  No coughing.  O2 with ambulation after 1 minute was 86%, HR 157. O2 with 2L O2 via [place] 92%, HR 120  Assessment/ Plan: 62 y.o. female   COPD with acute exacerbation (HCC) - Plan: albuterol (PROVENTIL) (2.5 MG/3ML) 0.083% nebulizer solution 2.5 mg, ipratropium (ATROVENT) nebulizer solution 0.5 mg, Basic Metabolic Panel, CBC, DG Chest 2 [facility] discharge follow-up - Plan: Basic Metabolic Panel, CBC, DG Chest 2 View  Shortness of breath - Plan: albuterol (PROVENTIL) (2.5 MG/3ML) 0.083% nebulizer solution 2.5 mg, ipratropium (ATROVENT) nebulizer solution 0.5 mg, Basic Metabolic Panel, CBC, DG Chest 2 View  Centrilobular emphysema (HCC) - Plan: Budeson-Glycopyrrol-Formoterol (BREZTRI AEROSPHERE) 160-9-4.8 MCG/ACT AERO  She is clearly having hypoxia with ambulation and so I have recommended that she utilize her home oxygen with activity.  I will CC this note to her pulmonologist for follow-up.  She was treated with DuoNeb here in office which did allow for better aeration and improvement of the sensation of shortness of  breath.  I consulted her pulmonologist who recommended resumption of the [place] he.  I do not think that she will need an oral steroid at this time as she had clinical improvement even after DuoNeb, but I did instruct her that if she finds that the [place] resumption is not helpful that she is to contact either me or Dr. Isaiah Serge so that we can send in prednisone burst until she is seen at the end of the month with him.  No orders of the defined types were placed in this encounter.  No orders of the defined types were placed in this encounter.   Today's visit is for Transitional Care Management.  The patient was discharged [facility] on [facility]/2023 with a primary diagnosis of COPD exacerbation.   Contact with the patient and/or caregiver, by a clinical staff member, was made on 03/18/2023 and was documented as a telephone encounter within the EMR.  Through chart review and discussion with the patient I have determined that management of their condition is of moderate complexity.    [name], DO [facility] [phone]

## 2023-03-25 NOTE — Patient Instructions (Signed)
I've messaged Dr Isaiah Serge and hope to have a recommendation for you prior to your next OV with him, which is scheduled 04/17/2023. Ok to reschedule our appt that day so you can prioritize your lung visit with him.

## 2023-03-26 ENCOUNTER — Encounter: Payer: Medicare HMO | Attending: Nurse Practitioner | Admitting: Nurse Practitioner

## 2023-03-26 ENCOUNTER — Encounter: Payer: Self-pay | Admitting: Nurse Practitioner

## 2023-03-26 VITALS — BP 102/78 | HR 105 | Ht 64.0 in | Wt 113.4 lb

## 2023-03-26 DIAGNOSIS — R000 Tachycardia, unspecified: Secondary | ICD-10-CM | POA: Diagnosis not present

## 2023-03-26 DIAGNOSIS — Z8679 Personal history of other diseases of the circulatory system: Secondary | ICD-10-CM | POA: Insufficient documentation

## 2023-03-26 DIAGNOSIS — J449 Chronic obstructive pulmonary disease, unspecified: Secondary | ICD-10-CM | POA: Diagnosis not present

## 2023-03-26 DIAGNOSIS — R55 Syncope and collapse: Secondary | ICD-10-CM | POA: Diagnosis not present

## 2023-03-26 DIAGNOSIS — Z87898 Personal history of other specified conditions: Secondary | ICD-10-CM | POA: Insufficient documentation

## 2023-03-26 DIAGNOSIS — Z720 Tobacco use: Secondary | ICD-10-CM | POA: Diagnosis not present

## 2023-03-26 DIAGNOSIS — R002 Palpitations: Secondary | ICD-10-CM | POA: Diagnosis not present

## 2023-03-26 LAB — CBC
Hematocrit: 45.1 % (ref 34.0–46.6)
Hemoglobin: 15.3 g/dL (ref 11.1–15.9)
MCH: 31 pg (ref 26.6–33.0)
MCHC: 33.9 g/dL (ref 31.5–35.7)
MCV: 91 fL (ref 79–97)
Platelets: 374 10*3/uL (ref 150–450)
RBC: 4.94 x10E6/uL (ref 3.77–5.28)
RDW: 12.8 % (ref 11.7–15.4)
WBC: 9 10*3/uL (ref 3.4–10.8)

## 2023-03-26 LAB — BASIC METABOLIC PANEL WITH GFR
BUN/Creatinine Ratio: 14 (ref 12–28)
BUN: 12 mg/dL (ref 8–27)
CO2: 23 mmol/L (ref 20–29)
Calcium: 9.1 mg/dL (ref 8.7–10.3)
Chloride: 102 mmol/L (ref 96–106)
Creatinine, Ser: 0.87 mg/dL (ref 0.57–1.00)
Glucose: 67 mg/dL — ABNORMAL LOW (ref 70–99)
Potassium: 4.6 mmol/L (ref 3.5–5.2)
Sodium: 142 mmol/L (ref 134–144)
eGFR: 75 mL/min/1.73

## 2023-03-26 MED ORDER — METOPROLOL TARTRATE 25 MG PO TABS: 12.5000 mg | ORAL_TABLET | Freq: Two times a day (BID) | ORAL | 2 refills | Status: DC | PRN

## 2023-03-26 NOTE — Progress Notes (Signed)
Office Visit    Patient Name: Alicia Sanchez Date of Encounter: 03/26/2023  PCP:  [name] DO   [facility]  Cardiologist:  Dina Rich, MD  Advanced Practice Provider:  Sharlene Dory, NP Electrophysiologist:  None  [phone]  Chief Complaint    Alicia Sanchez is a 62 y.o. female with a hx of syncope, palpitations, history of chest pain, COPD, osteoporosis, anxiety/depression/panic attacks who presents today for follow-up.   Past Medical History    Past Medical History:  Diagnosis Date   Anxiety    Aortic atherosclerosis (HCC)    COPD (chronic obstructive pulmonary disease) (HCC)    Depression    History of chest pain    History of syncope    Hypotension    Osteoporosis    Palpitations    Panic attacks    Restless legs syndrome (RLS)    Past Surgical History:  Procedure Laterality Date   ABDOMINAL HYSTERECTOMY      Allergies  Allergies  Allergen Reactions   Doxycycline     Chest pain    History of Present Illness    Alicia Sanchez is a 62 y.o. female with a past medical history as mentioned above.  Coronary CTA in 2021 revealed coronary calcium score of 45, nonobstructive mild CAD noted. History consistent with orthostatic syncope. Last seen by Dr. Dina Rich on Mar 27, 2022.  EKG showed normal sinus rhythm.  Conservative measures discussed regarding orthostatic syncope, recommended if ongoing symptoms may need to consider [place].  Was seen at [facility] 01/26/2023 for COPD exacerbation.  I last saw her for follow-up on 01/28/2023.  She stated that her syncopal episode happened 2 weeks ago, had a fall that resulted in nasal fracture, concussion, and was diagnosed with pneumonia. Completed treatment for pneumonia and was going to see an ENT the following day.  Admitted to low blood pressures, DBP averaging 80s at times, dizziness, and recent near syncopal episode. Denied any chest pain, shortness of breath,  palpitations, orthopnea, PND, swelling or significant weight changes, acute bleeding, or claudication.  Denied any recurrent syncopal episodes.  Workup revealed normal Myoview, unremarkable echocardiogram, and preliminary monitor report revealed predominantly normal sinus rhythm, 1 VT episode lasting 7 beats, 11 SVT episodes with longest lasting around 20 beats, average heart rate 148 bpm, some rare ventricular ectopy.  Today she presents for follow-up.  She tells me she was admitted last month for COPD exacerbation.  She is doing well today. Denies any recurrent syncopal/pre-syncopal episodes. Does admit to palpitations and feeling like her heart is racing. Says she gets short of breath with these episodes and was given breathing treatment recently. Denies any chest pain, syncope, presyncope, dizziness, orthopnea, PND, swelling or significant weight changes, acute bleeding, or claudication.   EKGs/Labs/Other Studies Reviewed:   The following studies were reviewed today:   EKG:  EKG is not ordered today.    Preliminary monitor report 02/2023: Patient had a min HR of 57 bpm, max HR of 179 bpm, and avg HR of 98 bpm. Predominant underlying rhythm was Sinus Rhythm. 1 run of Ventricular Tachycardia occurred lasting 7 beats with a max rate of 152 bpm (avg 126 bpm). 11 Supraventricular Tachycardia  runs occurred, the run with the fastest interval lasting 5 beats with a max rate of 179 bpm, the longest lasting 20 beats with an avg rate of 148 bpm. Isolated SVEs were rare (<1.0%), SVE Couplets were rare (<1.0%), and SVE Triplets were rare (<1.0%).  Isolated VEs were rare (<1.0%), VE Couplets were rare (<1.0%), and no VE Triplets were present.   Echocardiogram 02/2023: 1. Left ventricular ejection fraction, by estimation, is >75%. The left  ventricle has hyperdynamic function. The left ventricle has no regional  wall motion abnormalities. There is mild left ventricular hypertrophy.  Left ventricular  diastolic parameters  are consistent with Grade I diastolic dysfunction (impaired relaxation).   2. Right ventricular systolic function is normal. The right ventricular  size is normal.   3. The mitral valve is normal in structure. No evidence of mitral valve  regurgitation. No evidence of mitral stenosis.   4. The aortic valve was not well visualized. Aortic valve regurgitation  is not visualized.   5. The inferior vena cava is normal in size with greater than 50%  respiratory variability, suggesting right atrial pressure of 3 mmHg.   Comparison(s): Nuclear stress test done 08/03/21 showed an EF of 80%.  Echocardiogram done 07/11/18 showed an EF of 60-65%.  Myoview 01/2023:   The study is normal. The study is low risk.   No ST deviation was noted. The ECG was negative for ischemia.   LV perfusion is normal.  No significant myocardial perfusion defects to indicate scar or ischemia.   Left ventricular function is normal. Nuclear stress EF: 85 %.   Low risk study with no significant myocardial perfusion defects to indicate scar or ischemia.  LVEF 85%.   Lexiscan on August 03, 2021:   The study is low risk.   No ST deviation was noted. The ECG was negative for ischemia.   LV perfusion is abnormal. Defect 1: There is a small defect with mild reduction in uptake present in the mid inferior location(s) that is partially reversible. There is normal wall motion in the defect area. Consistent with mild ischemia.   Left ventricular function is normal. Nuclear stress EF: 80 %.   Small, partially reversible, mid inferior wall defect suggesting mild ischemic territory with vigorous LVEF 80%.  Low risk study.  Coronary CTA on February 04, 2020: IMPRESSION: 1. Coronary artery calcium score 45 Agatston units. This places the patient in the 85th percentile for age and gender, suggesting high risk for future cardiac events.   2.  Nonobstructive mild CAD noted.  Echocardiogram on July 11, 2018: Study Conclusions   - Left ventricle: The cavity size was normal. Wall thickness was    normal. Systolic function was normal. The estimated ejection    fraction was in the range of 60% to 65%. Wall motion was normal;    there were no regional wall motion abnormalities. Left    ventricular diastolic function parameters were normal.  - Aortic valve: Mildly calcified annulus. Probably trileaflet.  - Mitral valve: Mildly calcified annulus. There was trivial    regurgitation.  - Right ventricle: The cavity size was mildly dilated.  - Right atrium: Central venous pressure (est): 3 mm Hg.  - Atrial septum: No defect or patent foramen ovale was identified.  - Tricuspid valve: There was trivial regurgitation.  - Pericardium, extracardiac: A prominent pericardial fat pad was    present.   Recent Labs: 11/28/2022: TSH 1.690 03/12/2023: ALT 15 03/13/2023: Magnesium 2.0 03/25/2023: BUN 12; Creatinine, Ser 0.87; Hemoglobin 15.3; Platelets 374; Potassium 4.6; Sodium 142  Recent Lipid Panel    Component Value Date/Time   CHOL 134 10/27/2021 1156   TRIG 138 10/27/2021 1156   HDL 62 10/27/2021 1156   CHOLHDL 2.2 10/27/2021 1156   LDLCALC  48 10/27/2021 1156    Risk Assessment/Calculations:   The 10-year ASCVD risk score (Arnett DK, et al., 2019) is: 4.9%   Values used to calculate the score:     Age: 60 years     Sex: Female     Is Non-Hispanic African American: No     Diabetic: No     Tobacco smoker: Yes     Systolic Blood Pressure: 102 mmHg     Is BP treated: Yes     HDL Cholesterol: 62 mg/dL     Total Cholesterol: 134 mg/dL   Home Medications   Current Meds  Medication Sig   albuterol (VENTOLIN HFA) 108 (90 Base) MCG/ACT inhaler INHALE 2 PUFFS INTO THE LUNGS EVERY 6 (SIX) HOURS AS NEEDED FOR WHEEZING OR SHORTNESS OF BREATH.   alendronate (FOSAMAX) 70 MG tablet TAKE 1 TABLET EVERY 7 DAYS WITH A FULL GLASS OF WATER ON AN EMPTY STOMACH (Patient taking differently: Take 70 mg by  mouth once a week.)   ALPRAZolam (XANAX) 0.5 MG tablet TAKE 1 TABLET BY MOUTH THREE TIMES DAILY AND  1  EXTRA  PRN  FOR  INCREASED  ANXIETY (Patient taking differently: Take 0.5 mg by mouth 4 (four) times daily as needed for anxiety. TAKE 1 TABLET BY MOUTH THREE TIMES DAILY AND  1  EXTRA  PRN  FOR  INCREASED  ANXIETY)   aspirin EC 81 MG tablet Take 81 mg by mouth daily.   atorvastatin (LIPITOR) 40 MG tablet TAKE 1 TABLET EVERY DAY (NEED MD APPOINTMENT FOR REFILLS) (Patient taking differently: Take 40 mg by mouth daily.)   Budeson-Glycopyrrol-Formoterol (BREZTRI AEROSPHERE) 160-9-4.8 MCG/ACT AERO Inhale 2 puffs into the lungs 2 (two) times daily.   DULoxetine (CYMBALTA) 60 MG capsule TAKE 2 CAPSULES EVERY DAY   ipratropium-albuterol (DUONEB) 0.5-2.5 (3) MG/3ML SOLN INHALE THE CONTENTS OF 1 VIAL VIA NEBULIZER EVERY 6 HOURS AS NEEDED (Patient taking differently: Inhale 3 mLs into the lungs every 6 (six) hours as needed (Wheezing/SOB).)   meloxicam (MOBIC) 7.5 MG tablet Take 1 tablet (7.5 mg total) by mouth daily.   metoprolol tartrate (LOPRESSOR) 25 MG tablet Take 0.5 tablets (12.5 mg total) by mouth 2 (two) times daily as needed (palpitations).   nitroGLYCERIN (NITROSTAT) 0.4 MG SL tablet Place 1 tablet (0.4 mg total) under the tongue every 5 (five) minutes x 3 doses as needed for chest pain (if o relief after 2nd dose, proceed to the ED for an evaluation or call 911).   Respiratory Therapy Supplies (NEBULIZER/TUBING/MOUTHPIECE) KIT 1 Units by Does not apply route 4 (four) times daily. GIVE face mask and tubing for nebulizer. J44.1   rOPINIRole (REQUIP) 1 MG tablet TAKE 1-4 TABLETS BY MOUTH AT BEDTIME. (Patient taking differently: Take 3 mg by mouth at bedtime.)    Review of Systems    All other systems reviewed and are otherwise negative except as noted above.  Physical Exam    VS:  BP 102/78   Pulse (!) 105   Ht 5\' 4"  (1.626 m)   Wt 113 lb 6.4 oz (51.4 kg)   SpO2 95%   BMI 19.47 kg/m  , BMI  Body mass index is 19.47 kg/m.  Wt Readings from Last 3 Encounters:  03/26/23 113 lb 6.4 oz (51.4 kg)  03/25/23 112 lb (50.8 kg)  03/12/23 115 lb 1.3 oz (52.2 kg)     GEN: Well nourished, well developed, in no acute distress. HEENT: normal. Neck: Supple, no JVD, carotid bruits, or masses.  Cardiac: S1/S2, fast rate and normal rhythm, no murmurs, rubs, or gallops. No clubbing, cyanosis, edema.  Radials/PT 2+ and equal bilaterally.  Respiratory:  Respirations regular and unlabored, clear/diminished to auscultation bilaterally. MS: No deformity or atrophy. Skin: Warm and dry, no rash. Neuro:  Strength and sensation are intact. Tremors to bilateral arms  Psych: Normal affect.  Assessment & Plan    Hx of syncope, near syncope, low/soft BP Etiology not clear, no recurrent LOC/presyncope.  Was told in the past to be orthostatic in nature.  Previous negative orthostatics on exam.  Echocardiogram and Myoview unremarkable.  Recent labs unremarkable.  Monitor was negative for any high degree AV block or significant pauses.  Continue current medication regimen. Heart healthy diet and regular cardiovascular exercise encouraged.  ED precautions discussed.  2.  Palpitations, history of PSVT, tachycardia Does admit to palpitations/elevated heart rate at times with associated shortness of breath.  Heart rate 105 bpm today.  Preliminary monitor report revealed 1 run of VT, lasting 7 beats, 11 SVT episodes with the longest episode lasting 20 beats, average heart rate 148 bpm.  Currently not on any beta-blocker.  Case discussed with Dr. Dina Rich who recommended Lopressor 12.5 mg BID PRN for palpitations.  Recent lab work overall unremarkable.  Continue current medication regimen.  Discussed to avoid/limit caffeine/nicotine use-see below.  Her healthy diet and regular cardiovascular exercise encouraged.  ED precautions discussed.  3. COPD, tobacco use Has had recent hospitalizations for COPD  exacerbation.  Continues to smoke, has weaned down to now smoking only few cigarettes per day.  I congratulated her.  Smoking cessation encouraged and discussed. Continue to follow-up with Pulmonologist.   Disposition: Follow up in 6 months with Dina Rich, MD or APP.  Signed, Sharlene Dory, NP 03/26/2023, 1:42 PM [facility]

## 2023-03-26 NOTE — Patient Instructions (Addendum)
Medication Instructions:   Begin Lopressor 12.5mg  twice a day as needed  Continue all other medications.     Labwork:  none  Testing/Procedures:  none  Follow-Up:  6 months   Any Other Special Instructions Will Be Listed Below (If Applicable).   If you need a refill on your cardiac medications before your next appointment, please call your pharmacy.

## 2023-04-11 ENCOUNTER — Encounter: Payer: Self-pay | Admitting: Gastroenterology

## 2023-04-11 ENCOUNTER — Ambulatory Visit: Payer: Medicare HMO | Admitting: Cardiology

## 2023-04-11 ENCOUNTER — Ambulatory Visit: Payer: Medicare HMO | Admitting: Gastroenterology

## 2023-04-11 VITALS — BP 116/68 | HR 99 | Ht 64.0 in | Wt 114.0 lb

## 2023-04-11 DIAGNOSIS — G8929 Other chronic pain: Secondary | ICD-10-CM

## 2023-04-11 DIAGNOSIS — R1013 Epigastric pain: Secondary | ICD-10-CM

## 2023-04-11 DIAGNOSIS — R634 Abnormal weight loss: Secondary | ICD-10-CM | POA: Diagnosis not present

## 2023-04-11 DIAGNOSIS — R110 Nausea: Secondary | ICD-10-CM

## 2023-04-11 MED ORDER — OMEPRAZOLE 40 MG PO CPDR: 40.0000 mg | DELAYED_RELEASE_CAPSULE | Freq: Every day | ORAL | 2 refills | Status: DC

## 2023-04-11 MED ORDER — NA SULFATE-K SULFATE-MG SULF 17.5-3.13-1.6 GM/177ML PO SOLN: 1.0000 | Freq: Once | ORAL | 0 refills | Status: AC

## 2023-04-11 NOTE — Progress Notes (Signed)
[facility] Consult Note:  History: Alicia Sanchez 04/11/2023  Referring provider: [name], DO  Reason for consult/chief complaint: Weight Loss (Pt states she has a concern about her rapid weight lost.)   Subjective  HPI: This is a 62 year old woman referred by primary care for weight loss.  She reports having lost perhaps 20 pounds in the course of a few months starting late last year, and this seems to have prompted ED CT scan of the chest noted below.  Weight loss continued for unclear reasons until within the last month or so that it seems to have stabilized.  She has frequent nausea and intermittent epigastric pain that is nonradiating and not clearly related to meals.  Denies dysphagia or vomiting.  Denies altered bowel habits or rectal bleeding, and has had no prior colorectal cancer screening.  No family history colorectal or other digestive cancers. She confirms that she uses supplemental oxygen as needed and has cut down on cigarette use since the April hospitalization and working toward quitting.   ROS:  Review of Systems  Constitutional:  Negative for appetite change and unexpected weight change.  HENT:  Negative for mouth sores and voice change.   Eyes:  Negative for pain and redness.  Respiratory:  Positive for cough and shortness of breath.   Cardiovascular:  Negative for chest pain and palpitations.  Genitourinary:  Negative for dysuria and hematuria.  Musculoskeletal:  Negative for arthralgias and myalgias.  Skin:  Negative for pallor and rash.  Neurological:  Negative for weakness and headaches.  Hematological:  Negative for adenopathy.     Past Medical History: Past Medical History:  Diagnosis Date   Anxiety    Aortic atherosclerosis (HCC)    COPD (chronic obstructive pulmonary disease) (HCC)    Depression    History of chest pain    History of syncope    Hypotension    Osteoporosis    Palpitations    Panic attacks     Restless legs syndrome (RLS)    ED visits and hospitalization procedure PT in last few months.  Primary CARE post hospital follow-up note 03/25/2023 reviewed.  Indicates intermittent need for supplemental oxygen, ongoing dyspnea with exertion and at rest.  She was taken off diltiazem and takes midodrine as needed for hypotension.  Last cardiology office note from 03/26/23 reviewed - Lopressor prn for SVT and midodrine prn for low BP  Past Surgical History: Past Surgical History:  Procedure Laterality Date   ABDOMINAL HYSTERECTOMY       Family History: Family History  Problem Relation Age of Onset   COPD Mother    COPD Father    Diabetes type II Sister    Kidney failure Sister    Diabetes type II Brother    COPD Maternal Grandfather    Breast cancer Neg Hx    Liver disease Neg Hx    Esophageal cancer Neg Hx    Colon cancer Neg Hx     Social History: Social History   Socioeconomic History   Marital status: Legally Separated    Spouse name: Not on file   Number of children: 2   Years of education: Not on file   Highest education level: Not on file  Occupational History   Not on file  Tobacco Use   Smoking status: Every Day    Packs/day: 0.50    Years: 42.00    Additional pack years: 0.00    Total pack years: 21.00  Types: Cigarettes   Smokeless tobacco: Never   Tobacco comments:    1/2 pack or less   Vaping Use   Vaping Use: Never used  Substance and Sexual Activity   Alcohol use: Yes    Comment: soical   Drug use: Yes    Types: Marijuana   Sexual activity: Yes    Birth control/protection: Surgical  Other Topics Concern   Not on file  Social History Narrative   Not on file   Social Determinants of Health   Financial Resource Strain: Low Risk  (12/20/2022)   Overall Financial Resource Strain (CARDIA)    Difficulty of Paying Living Expenses: Not hard at all  Food Insecurity: No Food Insecurity (03/18/2023)   Hunger Vital Sign    Worried About Running Out  of Food in the Last Year: Never true    Ran Out of Food in the Last Year: Never true  Transportation Needs: No Transportation Needs (03/18/2023)   PRAPARE - Administrator, Civil Service (Medical): No    Lack of Transportation (Non-Medical): No  Physical Activity: Inactive (12/20/2022)   Exercise Vital Sign    Days of Exercise per Week: 0 days    Minutes of Exercise per Session: 0 min  Stress: No Stress Concern Present (12/20/2022)   [employer] of Occupational Health - Occupational Stress Questionnaire    Feeling of Stress : Not at all  Social Connections: Moderately Isolated (12/20/2022)   Social Connection and Isolation Panel [NHANES]    Frequency of Communication with Friends and Family: More than three times a week    Frequency of Social Gatherings with Friends and Family: More than three times a week    Attends Religious Services: More than 4 times per year    Active Member of [employer] or Organizations: No    Attends Banker Meetings: Never    Marital Status: Separated    Allergies: Allergies  Allergen Reactions   Doxycycline     Chest pain    Outpatient Meds: Current Outpatient Medications  Medication Sig Dispense Refill   albuterol (VENTOLIN HFA) 108 (90 Base) MCG/ACT inhaler INHALE 2 PUFFS INTO THE LUNGS EVERY 6 (SIX) HOURS AS NEEDED FOR WHEEZING OR SHORTNESS OF BREATH. 3 each 3   alendronate (FOSAMAX) 70 MG tablet TAKE 1 TABLET EVERY 7 DAYS WITH A FULL GLASS OF WATER ON AN EMPTY STOMACH (Patient taking differently: Take 70 mg by mouth once a week.) 12 tablet 0   ALPRAZolam (XANAX) 0.5 MG tablet TAKE 1 TABLET BY MOUTH THREE TIMES DAILY AND  1  EXTRA  PRN  FOR  INCREASED  ANXIETY (Patient taking differently: Take 0.5 mg by mouth 4 (four) times daily as needed for anxiety. TAKE 1 TABLET BY MOUTH THREE TIMES DAILY AND  1  EXTRA  PRN  FOR  INCREASED  ANXIETY) 120 tablet 2   aspirin EC 81 MG tablet Take 81 mg by mouth daily.     atorvastatin (LIPITOR) 40  MG tablet TAKE 1 TABLET EVERY DAY (NEED MD APPOINTMENT FOR REFILLS) (Patient taking differently: Take 40 mg by mouth daily.) 90 tablet 3   Budeson-Glycopyrrol-Formoterol (BREZTRI AEROSPHERE) 160-9-4.8 MCG/ACT AERO Inhale 2 puffs into the lungs 2 (two) times daily. 10.7 g 11   DULoxetine (CYMBALTA) 60 MG capsule TAKE 2 CAPSULES EVERY DAY 180 capsule 0   ipratropium-albuterol (DUONEB) 0.5-2.5 (3) MG/3ML SOLN INHALE THE CONTENTS OF 1 VIAL VIA NEBULIZER EVERY 6 HOURS AS NEEDED (Patient taking differently: Inhale 3  mLs into the lungs every 6 (six) hours as needed (Wheezing/SOB).) 360 mL 11   meloxicam (MOBIC) 7.5 MG tablet Take 1 tablet (7.5 mg total) by mouth daily. 90 tablet 3   metoprolol tartrate (LOPRESSOR) 25 MG tablet Take 0.5 tablets (12.5 mg total) by mouth 2 (two) times daily as needed (palpitations). 30 tablet 2   midodrine (PROAMATINE) 2.5 MG tablet Take 1 tablet (2.5 mg total) by mouth 3 (three) times daily as needed (blood pressure less than 90/60). 90 tablet 0   Na Sulfate-K Sulfate-Mg Sulf 17.5-3.13-1.6 GM/177ML SOLN Take 1 kit by mouth once for 1 dose. 354 mL 0   nitroGLYCERIN (NITROSTAT) 0.4 MG SL tablet Place 1 tablet (0.4 mg total) under the tongue every 5 (five) minutes x 3 doses as needed for chest pain (if o relief after 2nd dose, proceed to the ED for an evaluation or call 911). 25 tablet 2   omeprazole (PRILOSEC) 40 MG capsule Take 1 capsule (40 mg total) by mouth daily. 30 capsule 2   QUEtiapine (SEROQUEL) 200 MG tablet Take 200 mg by mouth at bedtime.     Respiratory Therapy Supplies (NEBULIZER/TUBING/MOUTHPIECE) KIT 1 Units by Does not apply route 4 (four) times daily. GIVE face mask and tubing for nebulizer. J44.1 1 kit 0   rOPINIRole (REQUIP) 1 MG tablet TAKE 1-4 TABLETS BY MOUTH AT BEDTIME. (Patient taking differently: Take 3 mg by mouth at bedtime.) 360 tablet 0   No current facility-administered medications for this visit.       ___________________________________________________________________ Objective   Exam:  BP 116/68   Pulse 99   Ht 5\' 4"  (1.626 m)   Wt 114 lb (51.7 kg)   BMI 19.57 kg/m  Wt Readings from Last 3 Encounters:  04/11/23 114 lb (51.7 kg)  03/26/23 113 lb 6.4 oz (51.4 kg)  03/25/23 112 lb (50.8 kg)    General: Pleasant and conversational.  Breathing comfortably on room air.  Thin with poor muscle mass Eyes: sclera anicteric, no redness ENT: oral mucosa moist without lesions, no cervical or supraclavicular lymphadenopathy CV: Regular without appreciable murmur, no JVD, no peripheral edema Resp: Globally decreased breath sounds bilaterally, mild end expiratory wheezing, normal RR and effort noted GI: soft, bandlike upper abdominal tenderness, with active bowel sounds. No guarding or palpable organomegaly noted. Skin; warm and dry, no rash or jaundice noted Neuro: awake, alert and oriented x 3. Normal gross motor function and fluent speech  Labs:     Latest Ref Rng & Units 03/25/2023   11:44 AM 03/14/2023    5:18 AM 03/13/2023    3:51 AM  CBC  WBC 3.4 - 10.8 x10E3/uL 9.0  9.6  16.8   Hemoglobin 11.1 - 15.9 g/dL 16.1  09.6  04.5   Hematocrit 34.0 - 46.6 % 45.1  39.8  37.7   Platelets 150 - 450 x10E3/uL 374  373  341       Latest Ref Rng & Units 03/25/2023   11:44 AM 03/13/2023    3:51 AM 03/12/2023    3:30 AM  CMP  Glucose 70 - 99 mg/dL 67  409  811   BUN 8 - 27 mg/dL 12  14  17    Creatinine 0.57 - 1.00 mg/dL 9.14  7.82  9.56   Sodium 134 - 144 mmol/L 142  137  135   Potassium 3.5 - 5.2 mmol/L 4.6  3.7  3.9   Chloride 96 - 106 mmol/L 102  103  101  CO2 20 - 29 mmol/L 23  26  24    Calcium 8.7 - 10.3 mg/dL 9.1  8.5  8.7   Total Protein 6.5 - 8.1 g/dL   6.6   Total Bilirubin 0.3 - 1.2 mg/dL   0.6   Alkaline Phos 38 - 126 U/L   57   AST 15 - 41 U/L   25   ALT 0 - 44 U/L   15    Last outpatient albumin of 4.6 on 02/04/2023  Inpatient albumin during COPD exacerbation 3.4  on 03/12/2023   Radiologic Studies:  CLINICAL DATA:  Dyspnea, shortness of breath, unintentional weight loss.   EXAM: CT CHEST WITHOUT CONTRAST   TECHNIQUE: Multidetector CT imaging of the chest was performed following the standard protocol without IV contrast.   RADIATION DOSE REDUCTION: This exam was performed according to the departmental dose-optimization program which includes automated exposure control, adjustment of the mA and/or kV according to patient size and/or use of iterative reconstruction technique.   COMPARISON:  Chest CT dated 04/03/2022.   FINDINGS: Cardiovascular: No significant vascular findings. Normal heart size. No pericardial effusion.   Mediastinum/Nodes: No enlarged mediastinal or axillary lymph nodes. Thyroid gland, trachea, and esophagus demonstrate no significant findings.   Lungs/Pleura: There are minimal tree-in-bud ground-glass opacities in the right lower lobe. There is mild bronchial wall thickening in the bilateral lower lobes. Emphysematous changes are noted bilaterally. There is no pleural effusion or pneumothorax.   Upper Abdomen: No acute abnormality.   Musculoskeletal: Degenerative changes are seen in the spine.   IMPRESSION: 1. Minimal tree-in-bud ground-glass opacities in the right lower lobe, likely infectious/inflammatory. 2. Mild bronchial wall thickening in the bilateral lower lobes may reflect chronic aspiration.   Emphysema (ICD10-J43.9).     Electronically Signed   By: Romona Curls M.D.   On: 11/28/2022 15:05  _________________________   Most recent echocardiogram on 02/18/2023;  1. Left ventricular ejection fraction, by estimation, is >75%. The left  ventricle has hyperdynamic function. The left ventricle has no regional  wall motion abnormalities. There is mild left ventricular hypertrophy.  Left ventricular diastolic parameters  are consistent with Grade I diastolic dysfunction (impaired relaxation).   2.  Right ventricular systolic function is normal. The right ventricular  size is normal.   3. The mitral valve is normal in structure. No evidence of mitral valve  regurgitation. No evidence of mitral stenosis.   4. The aortic valve was not well visualized. Aortic valve regurgitation  is not visualized.   5. The inferior vena cava is normal in size with greater than 50%  respiratory variability, suggesting right atrial pressure of 3 mmHg.   Comparison(s): Nuclear stress test done 08/03/21 showed an EF of 80%.  Echocardiogram done 07/11/18 showed an EF of 60-65%.   Assessment: Encounter Diagnoses  Name Primary?   Loss of weight Yes   Abdominal pain, chronic, epigastric    Nausea without vomiting     Loss of weight that is recently stabilized, unclear at this point if it is GI related.  No source on CT scan chest in January of this year.  Chronic nausea and epigastric pain in a patient on aspirin, raises suspicion of possible peptic ulcer, H. pylori gastritis, malignancy.  Upper abdominal tenderness on exam.  Plan: EGD and colonoscopy.  She was agreeable after discussion of procedure and risks.  The benefits and risks of the planned procedure were described in detail with the patient or (when appropriate) their health care proxy.  Risks were outlined as including, but not limited to, bleeding, infection, perforation, adverse medication reaction leading to cardiac or pulmonary decompensation, pancreatitis (if ERCP).  The limitation of incomplete mucosal visualization was also discussed.  No guarantees or warranties were given.  Patient at increased risk for cardiopulmonary complications of procedure due to medical comorbidities.  Her procedures must be done on hospital outpatient endoscopy department due to her supplemental oxygen requirement and advanced COPD.  The next available date in that setting is in early July, so she was given that date.  Meanwhile, I started her on omeprazole 40 mg  once daily in case there is peptic ulcer.  CT scan abdomen and pelvis with oral and IV contrast given her upper abdominal pain, nausea, tenderness on exam and reported weight loss.   Thank you for the courtesy of this consult.  Please call me with any questions or concerns.  Charlie Pitter III  CC: Referring provider noted above

## 2023-04-11 NOTE — Patient Instructions (Signed)
_______________________________________________________  If your blood pressure at your visit was 140/90 or greater, please contact your primary care physician to follow up on this.  _______________________________________________________  If you are age 63 or older, your body mass index should be between 23-30. Your Body mass index is 19.57 kg/m. If this is out of the aforementioned range listed, please consider follow up with your Primary Care Provider.  If you are age 26 or younger, your body mass index should be between 19-25. Your Body mass index is 19.57 kg/m. If this is out of the aformentioned range listed, please consider follow up with your Primary Care Provider.   ________________________________________________________  The [facility] providers would like to encourage you to use [facility] to communicate with providers for non-urgent requests or questions.  Due to long hold times on the telephone, sending your provider a message by [facility] may be a faster and more efficient way to get a response.  Please allow 48 business hours for a response.  Please remember that this is for non-urgent requests.  _______________________________________________________  Due to recent changes in healthcare laws, you may see the results of your imaging and laboratory studies on MyChart before your provider has had a chance to review them.  We understand that in some cases there may be results that are confusing or concerning to you. Not all laboratory results come back in the same time frame and the provider may be waiting for multiple results in order to interpret others.  Please give [place] 48 hours in order for your provider to thoroughly review all the results before contacting the office for clarification of your results.    It was a pleasure to see you today!  Thank you for trusting me with your gastrointestinal care!

## 2023-04-12 ENCOUNTER — Ambulatory Visit (HOSPITAL_COMMUNITY)
Admission: RE | Admit: 2023-04-12 | Discharge: 2023-04-12 | Disposition: A | Discharge status: Home / Self Care | Payer: Medicare HMO | Source: Ambulatory Visit | Attending: Acute Care | Admitting: Acute Care

## 2023-04-12 DIAGNOSIS — Z122 Encounter for screening for malignant neoplasm of respiratory organs: Secondary | ICD-10-CM | POA: Diagnosis not present

## 2023-04-12 DIAGNOSIS — Z87891 Personal history of nicotine dependence: Secondary | ICD-10-CM | POA: Insufficient documentation

## 2023-04-12 DIAGNOSIS — F17210 Nicotine dependence, cigarettes, uncomplicated: Secondary | ICD-10-CM | POA: Insufficient documentation

## 2023-04-17 ENCOUNTER — Encounter: Payer: Self-pay | Admitting: Family Medicine

## 2023-04-17 ENCOUNTER — Ambulatory Visit: Payer: Medicare HMO | Admitting: Pulmonary Disease

## 2023-04-17 ENCOUNTER — Encounter: Payer: Self-pay | Admitting: Pulmonary Disease

## 2023-04-17 ENCOUNTER — Telehealth: Payer: Medicare HMO | Admitting: Family Medicine

## 2023-04-17 VITALS — BP 114/64 | HR 92 | Temp 98.2°F | Ht 64.0 in | Wt 113.6 lb

## 2023-04-17 DIAGNOSIS — F17210 Nicotine dependence, cigarettes, uncomplicated: Secondary | ICD-10-CM | POA: Diagnosis not present

## 2023-04-17 DIAGNOSIS — M810 Age-related osteoporosis without current pathological fracture: Secondary | ICD-10-CM | POA: Diagnosis not present

## 2023-04-17 DIAGNOSIS — J432 Centrilobular emphysema: Secondary | ICD-10-CM

## 2023-04-17 DIAGNOSIS — J449 Chronic obstructive pulmonary disease, unspecified: Secondary | ICD-10-CM

## 2023-04-17 MED ORDER — VARENICLINE TARTRATE (STARTER) 0.5 MG X 11 & 1 MG X 42 PO TBPK: ORAL_TABLET | ORAL | 0 refills | Status: DC

## 2023-04-17 NOTE — Progress Notes (Signed)
Alicia Sanchez    [id number]    September 22, 1961  Primary Care Physician:Gottschalk, Rozell Searing, DO  Referring Physician: [name], DO [address] [place],  [place] [address]  Chief complaint:   Follow-up for COPD  HPI: Alicia Sanchez is a 62 y.o.  active smoker with history of anxiety, depression, COPD.  Initially treated with Virgel Bouquet and Incruse which was consolidated to Trelegy inhaler in 2018  She has daily symptoms of snoring. She does sleep study 4 years ago but does not know the result of this test. She is a diagnosis of restless leg syndrome and is on requip. She did not have the repeat sleep study done as it was not covered by insurance.  Pets:Dog which lives outside the house. No pets, exotic pets. Occupation:Customer sevice rep. previously worked in [employer] Exposures: His current dust exposure and previous exposure to cotton fiber in her line of work Smoking history: 35-pack-year smoking history. Continues to smoke 1 pack per day She quit smoking in 2021 but unfortunately started again due to family stresses  Interim history: Continues on breztri inhaler. Continues to smoke due to personal stressors at home.  States that she cannot afford nicotine patches as it is not covered by her insurance.   Outpatient Encounter Medications as of 04/17/2023  Medication Sig   albuterol (VENTOLIN HFA) 108 (90 Base) MCG/ACT inhaler INHALE 2 PUFFS INTO THE LUNGS EVERY 6 (SIX) HOURS AS NEEDED FOR WHEEZING OR SHORTNESS OF BREATH.   alendronate (FOSAMAX) 70 MG tablet TAKE 1 TABLET EVERY 7 DAYS WITH A FULL GLASS OF WATER ON AN EMPTY STOMACH (Patient taking differently: Take 70 mg by mouth once a week.)   ALPRAZolam (XANAX) 0.5 MG tablet TAKE 1 TABLET BY MOUTH THREE TIMES DAILY AND  1  EXTRA  PRN  FOR  INCREASED  ANXIETY (Patient taking differently: Take 0.5 mg by mouth 4 (four) times daily as needed for anxiety. TAKE 1 TABLET BY MOUTH THREE TIMES DAILY AND  1  EXTRA  PRN  FOR   INCREASED  ANXIETY)   aspirin EC 81 MG tablet Take 81 mg by mouth daily.   atorvastatin (LIPITOR) 40 MG tablet TAKE 1 TABLET EVERY DAY (NEED MD APPOINTMENT FOR REFILLS) (Patient taking differently: Take 40 mg by mouth daily.)   Budeson-Glycopyrrol-Formoterol (BREZTRI AEROSPHERE) 160-9-4.8 MCG/ACT AERO Inhale 2 puffs into the lungs 2 (two) times daily.   DULoxetine (CYMBALTA) 60 MG capsule TAKE 2 CAPSULES EVERY DAY   ipratropium-albuterol (DUONEB) 0.5-2.5 (3) MG/3ML SOLN INHALE THE CONTENTS OF 1 VIAL VIA NEBULIZER EVERY 6 HOURS AS NEEDED (Patient taking differently: Inhale 3 mLs into the lungs every 6 (six) hours as needed (Wheezing/SOB).)   meloxicam (MOBIC) 7.5 MG tablet Take 1 tablet (7.5 mg total) by mouth daily.   metoprolol tartrate (LOPRESSOR) 25 MG tablet Take 0.5 tablets (12.5 mg total) by mouth 2 (two) times daily as needed (palpitations).   midodrine (PROAMATINE) 2.5 MG tablet Take 1 tablet (2.5 mg total) by mouth 3 (three) times daily as needed (blood pressure less than 90/60).   omeprazole (PRILOSEC) 40 MG capsule Take 1 capsule (40 mg total) by mouth daily.   QUEtiapine (SEROQUEL) 200 MG tablet Take 200 mg by mouth at bedtime.   Respiratory Therapy Supplies (NEBULIZER/TUBING/MOUTHPIECE) KIT 1 Units by Does not apply route 4 (four) times daily. GIVE face mask and tubing for nebulizer. J44.1   rOPINIRole (REQUIP) 1 MG tablet TAKE 1-4 TABLETS BY MOUTH  AT BEDTIME. (Patient taking differently: Take 3 mg by mouth at bedtime.)   nitroGLYCERIN (NITROSTAT) 0.4 MG SL tablet Place 1 tablet (0.4 mg total) under the tongue every 5 (five) minutes x 3 doses as needed for chest pain (if o relief after 2nd dose, proceed to the ED for an evaluation or call 911). (Patient not taking: Reported on 04/17/2023)   No facility-administered encounter medications on file as of 04/17/2023.   Physical Exam: Blood pressure 114/64, pulse 92, temperature 98.2 F (36.8 C), temperature source Oral, height 5\' 4"  (1.626  m), weight 113 lb 9.6 oz (51.5 kg), SpO2 99 %. Gen:      No acute distress HEENT:  EOMI, sclera anicteric Neck:     No masses; no thyromegaly Lungs:    Scattered expiratory wheeze CV:         Regular rate and rhythm; no murmurs Abd:      + bowel sounds; soft, non-tender; no palpable masses, no distension Ext:    No edema; adequate peripheral perfusion Skin:      Warm and dry; no rash Neuro: alert and oriented x 3 Psych: normal mood and affect   Data Reviewed: Imaging Screening CT chest 05/08/17-centrilobular emphysema, calcified granuloma, tiny subcentimeter pulmonary nodules. CTA 05/21/2018- emphysema, scattered pulmonary nodule.  4 mm right lower lung nodule is new. I have reviewed the images personally  CTA [facility]) 03/11/2019-no pulmonary embolism.  No active cardiopulmonary disease.  Coronary artery calcification No images available in PACS  Screening CT 12/17/2019- emphysema, tiny calcified pulmonary nodules  CTA 02/09/2020- no PE, lungs are clear with emphysema.  Screening CT 04/03/2021- emphysema, stable pulmonary nodules.  Screening CT chest 04/03/2022- moderate emphysema, stable lung nodules.  Screening CT chest 04/12/2023-  I have reviewed the images personally.  PFTs  07/24/17 FVC 2.54 (74%], FEV1 1.55 (58%], F/F 61, TLC 103%, DLCO 64% Moderate obstruction and diffusion impairment with air trapping  08/18/2018 FVC 2.26 [70%], FEV1 1.23 [49%], F/F 54, TLC 5.98 [123%], DLCO 12.55 [56%] Severe obstructive airway disease.  Worse compared to 2018  Labs CBC/1/17-absolute eosinophil count 0 Alpha-1 antitrypsin 04/25/2017-136, PI MM  Assessment:  COPD, chronic bronchitis Currently on breztri, albuterol  Active smoker Continues to smoke We discussed smoking cessation and she wants to attempt again to quit.  She is also vaping and attempt to come off cigarettes. She cannot afford nicotine patches.  Prescribed Chantix. Time spent counseling-5 minutes.  Reassess at  return visit.  Continue low-dose screening CT.  CT scan on 5/24 still pending final radiology read but on my review appears to be okay with no worrisome nodules.   Restless leg syndrome, suspected sleep apnea On requip for restless leg syndrome.  Sleep study not done as it is not covered by insurance.  Plan/Recommendations: - Continue breztri duo nebs as needed - Smoking cessation.  Prescribe Chantix.  Chilton Greathouse MD [facility] 04/17/2023, 10:00 AM  CC: [name], DO     Chilton Greathouse, MD

## 2023-04-17 NOTE — Patient Instructions (Signed)
Continue the inhalers Continue working on some open cessation Will send in a prescription for Chantix Follow-up in 1 year

## 2023-04-17 NOTE — Progress Notes (Signed)
MyChart Video visit  Subjective: Alicia Sanchez up PCP: [name], DO Alicia Sanchez is a 62 y.o. female. Patient provides verbal consent for consult held via video.  Due to COVID-19 pandemic this visit was conducted virtually. This visit type was conducted due to national recommendations for restrictions regarding the COVID-19 Pandemic (e.g. social distancing, sheltering in place) in an effort to limit this patient's exposure and mitigate transmission in our community. All issues noted in this document were discussed and addressed.  A physical exam was not performed with this format.   Location of patient: car Location of provider: [facility] Others present for call: none  1.  Emphysema Patient recently saw her pulmonologist who gave her a good checkup.  She will be continued on Breztri.  She is doing well on this and does not need any refills.  Using patient assistance for this.  Feels like she is at her baseline  2.  Gastritis Has EGD and colonoscopy planned for 05/20/2023.  Recently started on PPI as she had some epigastric tenderness on exam with gastroenterology recently.  Tolerating this medication without difficulty.  Continues to have quite a bit of stress related to the "squatters" in her home right now.  Residing with a friend currently.  3.  Palpitations Cardiology recently started her on beta-blocker as needed.  This is working for her.  No adverse side effects   ROS: Per HPI  Allergies  Allergen Reactions   Doxycycline     Chest pain   Past Medical History:  Diagnosis Date   Anxiety    Aortic atherosclerosis (HCC)    COPD (chronic obstructive pulmonary disease) (HCC)    Depression    History of chest pain    History of syncope    Hypotension    Osteoporosis    Palpitations    Panic attacks    Restless legs syndrome (RLS)     Current Outpatient Medications:    albuterol (VENTOLIN HFA) 108 (90 Base) MCG/ACT inhaler, INHALE 2 PUFFS INTO THE LUNGS EVERY 6 (SIX)  HOURS AS NEEDED FOR WHEEZING OR SHORTNESS OF BREATH., Disp: 3 each, Rfl: 3   alendronate (FOSAMAX) 70 MG tablet, TAKE 1 TABLET EVERY 7 DAYS WITH A FULL GLASS OF WATER ON AN EMPTY STOMACH (Patient taking differently: Take 70 mg by mouth once a week.), Disp: 12 tablet, Rfl: 0   ALPRAZolam (XANAX) 0.5 MG tablet, TAKE 1 TABLET BY MOUTH THREE TIMES DAILY AND  1  EXTRA  PRN  FOR  INCREASED  ANXIETY (Patient taking differently: Take 0.5 mg by mouth 4 (four) times daily as needed for anxiety. TAKE 1 TABLET BY MOUTH THREE TIMES DAILY AND  1  EXTRA  PRN  FOR  INCREASED  ANXIETY), Disp: 120 tablet, Rfl: 2   aspirin EC 81 MG tablet, Take 81 mg by mouth daily., Disp: , Rfl:    atorvastatin (LIPITOR) 40 MG tablet, TAKE 1 TABLET EVERY DAY (NEED MD APPOINTMENT FOR REFILLS) (Patient taking differently: Take 40 mg by mouth daily.), Disp: 90 tablet, Rfl: 3   Budeson-Glycopyrrol-Formoterol (BREZTRI AEROSPHERE) 160-9-4.8 MCG/ACT AERO, Inhale 2 puffs into the lungs 2 (two) times daily., Disp: 10.7 g, Rfl: 11   DULoxetine (CYMBALTA) 60 MG capsule, TAKE 2 CAPSULES EVERY DAY, Disp: 180 capsule, Rfl: 0   ipratropium-albuterol (DUONEB) 0.5-2.5 (3) MG/3ML SOLN, INHALE THE CONTENTS OF 1 VIAL VIA NEBULIZER EVERY 6 HOURS AS NEEDED (Patient taking differently: Inhale 3 mLs into the lungs every 6 (six) hours as needed (Wheezing/SOB).), Disp:  360 mL, Rfl: 11   meloxicam (MOBIC) 7.5 MG tablet, Take 1 tablet (7.5 mg total) by mouth daily., Disp: 90 tablet, Rfl: 3   metoprolol tartrate (LOPRESSOR) 25 MG tablet, Take 0.5 tablets (12.5 mg total) by mouth 2 (two) times daily as needed (palpitations)., Disp: 30 tablet, Rfl: 2   midodrine (PROAMATINE) 2.5 MG tablet, Take 1 tablet (2.5 mg total) by mouth 3 (three) times daily as needed (blood pressure less than 90/60)., Disp: 90 tablet, Rfl: 0   nitroGLYCERIN (NITROSTAT) 0.4 MG SL tablet, Place 1 tablet (0.4 mg total) under the tongue every 5 (five) minutes x 3 doses as needed for chest pain (if o  relief after 2nd dose, proceed to the ED for an evaluation or call 911). (Patient not taking: Reported on 04/17/2023), Disp: 25 tablet, Rfl: 2   omeprazole (PRILOSEC) 40 MG capsule, Take 1 capsule (40 mg total) by mouth daily., Disp: 30 capsule, Rfl: 2   QUEtiapine (SEROQUEL) 200 MG tablet, Take 200 mg by mouth at bedtime., Disp: , Rfl:    Respiratory Therapy Supplies (NEBULIZER/TUBING/MOUTHPIECE) KIT, 1 Units by Does not apply route 4 (four) times daily. GIVE face mask and tubing for nebulizer. J44.1, Disp: 1 kit, Rfl: 0   rOPINIRole (REQUIP) 1 MG tablet, TAKE 1-4 TABLETS BY MOUTH AT BEDTIME. (Patient taking differently: Take 3 mg by mouth at bedtime.), Disp: 360 tablet, Rfl: 0   Varenicline Tartrate, Starter, (CHANTIX STARTING MONTH PAK) 0.5 MG X 11 & 1 MG X 42 TBPK, Take as instructed, Disp: 53 each, Rfl: 0  Gen: nontoxic female, NAD, sitting in car Pulm: normal WOB on room air. No dyspnea with speech. No coughing   Assessment/ Plan: 62 y.o. female   Centrilobular emphysema (HCC)  Age-related osteoporosis without current pathological fracture - Plan: VITAMIN D 25 Hydroxy (Vit-D Deficiency, Fractures), DG [facility] DEXA  Got a good checkup from her pulmonologist.  She will continue current regimen.  She is plugged in with Alicia Sanchez to get patient assistance and is not having any affordability or access issues to the respiratory right now.  Pulmonary issues have totally resolved after recent infection and she is feeling back to baseline.  I would like her to have a DEXA scan updated as well as vitamin D level.  She has Fosamax for now but want to ensure that this medication continues to work as indicated and that we do not need to advance therapy, particularly given multiple needs for steroid use.  Start time: 3:57pm End time: 4:06pm  Total time spent on patient care (including video visit/ documentation): 9 minutes  Sherrick Araki Hulen Skains, DO [facility] [phone]

## 2023-04-22 ENCOUNTER — Other Ambulatory Visit: Payer: Self-pay

## 2023-04-22 DIAGNOSIS — F17210 Nicotine dependence, cigarettes, uncomplicated: Secondary | ICD-10-CM

## 2023-04-22 DIAGNOSIS — Z87891 Personal history of nicotine dependence: Secondary | ICD-10-CM

## 2023-05-03 ENCOUNTER — Other Ambulatory Visit: Payer: Self-pay | Admitting: Family Medicine

## 2023-05-06 ENCOUNTER — Other Ambulatory Visit: Payer: Self-pay | Admitting: Adult Health

## 2023-05-06 DIAGNOSIS — F410 Panic disorder [episodic paroxysmal anxiety] without agoraphobia: Secondary | ICD-10-CM

## 2023-05-06 DIAGNOSIS — F411 Generalized anxiety disorder: Secondary | ICD-10-CM

## 2023-05-09 ENCOUNTER — Other Ambulatory Visit: Payer: Self-pay

## 2023-05-09 ENCOUNTER — Encounter (HOSPITAL_COMMUNITY): Payer: Self-pay | Admitting: Gastroenterology

## 2023-05-09 NOTE — Progress Notes (Addendum)
COVID Vaccine Completed: yes   Date of COVID positive in last 90 days: no  PCP - Doylene Canard, MD Cardiologist - Dina Rich, MD Pulmonologist- Praveen Mannam  Recent pulm and cards visits. Stable at both  Chest x-ray - 03/25/23 Coronary CT 02/04/20 EKG - 03/12/23 Stress Test - 02/12/23 ECHO - 02/18/23 Cardiac Cath - n/a Pacemaker/ICD device last checked: n/a Spinal Cord Stimulator: n/a  Bowel Prep - pt aware and has instructions  Sleep Study - yes, negative CPAP -   Fasting Blood Sugar - n/a Checks Blood Sugar _____ times a day  Last dose of GLP1 agonist-  N/A GLP1 instructions:  N/A   Last dose of SGLT-2 inhibitors-  N/A SGLT-2 instructions: N/A   Blood Thinner Instructions:  Aspirin Instructions: ASA 81 Last Dose:  Activity level: Can go up a flight of stairs and perform activities of daily living without stopping and without symptoms of chest pain. SOB, pt has COPD. Difficulty with stairs   Anesthesia review: COPD, palpitations, CP, syncope. Sent to [place], [place]  Patient denies shortness of breath, fever, cough and chest pain at PAT appointment  Patient verbalized understanding of instructions that were given to them at the PAT appointment. Patient was also instructed that they will need to review over the PAT instructions again at home before surgery.

## 2023-05-14 ENCOUNTER — Other Ambulatory Visit: Payer: Medicare HMO

## 2023-05-14 ENCOUNTER — Ambulatory Visit: Payer: Medicare HMO | Admitting: Family Medicine

## 2023-05-16 ENCOUNTER — Other Ambulatory Visit: Payer: Self-pay | Admitting: Family Medicine

## 2023-05-16 ENCOUNTER — Telehealth: Payer: Self-pay

## 2023-05-16 DIAGNOSIS — G2581 Restless legs syndrome: Secondary | ICD-10-CM

## 2023-05-16 NOTE — Telephone Encounter (Signed)
MyChart message sent to patient to confirm upcoming procedures at [facility].

## 2023-05-17 NOTE — Telephone Encounter (Signed)
Patient reviewed but did not respond to MyChart message.  Last read by Lynnell Jude at 11:22 AM on 05/17/2023.

## 2023-05-20 ENCOUNTER — Encounter (HOSPITAL_COMMUNITY)
Admission: RE | Disposition: A | Discharge status: Home / Self Care | Payer: Self-pay | Source: Home / Self Care | Attending: Gastroenterology

## 2023-05-20 ENCOUNTER — Other Ambulatory Visit: Payer: Self-pay

## 2023-05-20 ENCOUNTER — Ambulatory Visit (HOSPITAL_COMMUNITY): Payer: Medicare HMO | Admitting: Physician Assistant

## 2023-05-20 ENCOUNTER — Encounter (HOSPITAL_COMMUNITY): Payer: Self-pay | Admitting: Gastroenterology

## 2023-05-20 ENCOUNTER — Ambulatory Visit (HOSPITAL_COMMUNITY)
Admission: RE | Admit: 2023-05-20 | Discharge: 2023-05-20 | Disposition: A | Discharge status: Home / Self Care | Payer: Medicare HMO | Attending: Gastroenterology | Admitting: Gastroenterology

## 2023-05-20 DIAGNOSIS — G8929 Other chronic pain: Secondary | ICD-10-CM

## 2023-05-20 DIAGNOSIS — R634 Abnormal weight loss: Secondary | ICD-10-CM | POA: Diagnosis not present

## 2023-05-20 DIAGNOSIS — Z681 Body mass index (BMI) 19 or less, adult: Secondary | ICD-10-CM | POA: Insufficient documentation

## 2023-05-20 DIAGNOSIS — R110 Nausea: Secondary | ICD-10-CM

## 2023-05-20 DIAGNOSIS — Z9981 Dependence on supplemental oxygen: Secondary | ICD-10-CM | POA: Insufficient documentation

## 2023-05-20 DIAGNOSIS — K3189 Other diseases of stomach and duodenum: Secondary | ICD-10-CM | POA: Insufficient documentation

## 2023-05-20 DIAGNOSIS — K2970 Gastritis, unspecified, without bleeding: Secondary | ICD-10-CM

## 2023-05-20 DIAGNOSIS — E782 Mixed hyperlipidemia: Secondary | ICD-10-CM | POA: Diagnosis not present

## 2023-05-20 DIAGNOSIS — R1013 Epigastric pain: Secondary | ICD-10-CM

## 2023-05-20 DIAGNOSIS — Q438 Other specified congenital malformations of intestine: Secondary | ICD-10-CM

## 2023-05-20 DIAGNOSIS — J441 Chronic obstructive pulmonary disease with (acute) exacerbation: Secondary | ICD-10-CM | POA: Diagnosis not present

## 2023-05-20 DIAGNOSIS — J449 Chronic obstructive pulmonary disease, unspecified: Secondary | ICD-10-CM | POA: Insufficient documentation

## 2023-05-20 HISTORY — PX: BIOPSY: SHX5522

## 2023-05-20 HISTORY — PX: COLONOSCOPY WITH PROPOFOL: SHX5780

## 2023-05-20 HISTORY — DX: Headache, unspecified: R51.9

## 2023-05-20 HISTORY — DX: Chronic kidney disease, unspecified: N18.9

## 2023-05-20 HISTORY — DX: Pneumonia, unspecified organism: J18.9

## 2023-05-20 HISTORY — PX: ESOPHAGOGASTRODUODENOSCOPY (EGD) WITH PROPOFOL: SHX5813

## 2023-05-20 SURGERY — ESOPHAGOGASTRODUODENOSCOPY (EGD) WITH PROPOFOL
Anesthesia: Monitor Anesthesia Care

## 2023-05-20 MED ORDER — PROPOFOL 1000 MG/100ML IV EMUL
INTRAVENOUS | Status: AC
  Filled 2023-05-20: qty 100

## 2023-05-20 MED ORDER — PROPOFOL 500 MG/50ML IV EMUL
INTRAVENOUS | Status: DC | PRN
  Administered 2023-05-20: 100 mg via INTRAVENOUS

## 2023-05-20 MED ORDER — PROPOFOL 10 MG/ML IV BOLUS
INTRAVENOUS | Status: DC | PRN
  Administered 2023-05-20: 120 ug/kg/min via INTRAVENOUS

## 2023-05-20 MED ORDER — MIDAZOLAM HCL 2 MG/2ML IJ SOLN
INTRAMUSCULAR | Status: AC
  Filled 2023-05-20: qty 2

## 2023-05-20 MED ORDER — LACTATED RINGERS IV SOLN: INTRAVENOUS | Status: DC

## 2023-05-20 MED ORDER — LIDOCAINE HCL (PF) 2 % IJ SOLN
INTRAMUSCULAR | Status: DC | PRN
  Administered 2023-05-20: 80 mg via INTRADERMAL

## 2023-05-20 MED ORDER — PHENYLEPHRINE HCL (PRESSORS) 10 MG/ML IV SOLN
INTRAVENOUS | Status: DC | PRN
  Administered 2023-05-20: 100 ug via INTRAVENOUS

## 2023-05-20 SURGICAL SUPPLY — 25 items

## 2023-05-20 NOTE — Anesthesia Preprocedure Evaluation (Addendum)
Anesthesia Evaluation  Patient identified by MRN, date of birth, ID band Patient awake    Reviewed: Allergy & Precautions, NPO status , Patient's Chart, lab work & pertinent test results  Airway Mallampati: II  TM Distance: >3 FB Neck ROM: Full    Dental  (+) Edentulous Upper, Edentulous Lower   Pulmonary COPD,  COPD inhaler and oxygen dependent, Current SmokerPatient did not abstain from smoking.   Pulmonary exam normal        Cardiovascular negative cardio ROS Normal cardiovascular exam     Neuro/Psych  Headaches PSYCHIATRIC DISORDERS Anxiety Depression     Neuromuscular disease    GI/Hepatic negative GI ROS, Neg liver ROS,,,  Endo/Other  negative endocrine ROS    Renal/GU Renal disease     Musculoskeletal  (+) Arthritis ,    Abdominal   Peds  Hematology negative hematology ROS (+)   Anesthesia Other Findings Weight loss Epigastric pain Nausea     Reproductive/Obstetrics                             Anesthesia Physical Anesthesia Plan  ASA: 3  Anesthesia Plan: MAC   Post-op Pain Management:    Induction: Intravenous  PONV Risk Score and Plan: 1 and Propofol infusion and Treatment may vary due to age or medical condition  Airway Management Planned: Nasal Cannula  Additional Equipment:   Intra-op Plan:   Post-operative Plan:   Informed Consent: I have reviewed the patients History and Physical, chart, labs and discussed the procedure including the risks, benefits and alternatives for the proposed anesthesia with the patient or authorized representative who has indicated his/her understanding and acceptance.       Plan Discussed with: CRNA  Anesthesia Plan Comments:        Anesthesia Quick Evaluation

## 2023-05-20 NOTE — Op Note (Signed)
[facility] Patient Name: Alicia Sanchez Procedure Date: 05/20/2023 MRN: [id number] Attending MD: Starr Lake. Myrtie Neither , MD, [id number] Date of Birth: 19-Mar-1961 CSN: [id number] Age: 62 Admit Type: Outpatient Procedure:                Upper GI endoscopy Indications:              Epigastric abdominal pain, Weight loss Providers:                Sherilyn Cooter L. Myrtie Neither, MD, Adolph Pollack, RN, Rozetta Nunnery, Technician Referring MD:             Starr Lake. Myrtie Neither, MD Medicines:                Monitored Anesthesia Care Complications:            No immediate complications. Estimated Blood Loss:     Estimated blood loss was minimal. Procedure:                Pre-Anesthesia Assessment:                           - Prior to the procedure, a History and Physical                            was performed, and patient medications and                            allergies were reviewed. The patient's tolerance of                            previous anesthesia was also reviewed. The risks                            and benefits of the procedure and the sedation                            options and risks were discussed with the patient.                            All questions were answered, and informed consent                            was obtained. Prior Anticoagulants: The patient has                            taken no anticoagulant or antiplatelet agents. ASA                            Grade Assessment: IV - A patient with severe                            systemic disease that is a constant threat to life.  After reviewing the risks and benefits, the patient                            was deemed in satisfactory condition to undergo the                            procedure.                           After obtaining informed consent, the endoscope was                            passed under direct vision. Throughout the                             procedure, the patient's blood pressure, pulse, and                            oxygen saturations were monitored continuously. The                            [id number]-H190 ([id number]) Olympus endoscope was introduced                            through the mouth, and advanced to the second part                            of duodenum. The upper GI endoscopy was                            accomplished without difficulty. The patient                            tolerated the procedure well. Scope In: Scope Out: Findings:      The larynx was normal.      The esophagus was normal.      Diffuse inflammation characterized by congestion (edema) and erythema       with patchy mucosal atrophy was found in the gastric antrum. Biopsies       were taken with a cold forceps for histology (antrum and body, one jar).      The exam of the stomach was otherwise normal.      The examined duodenum was normal. Impression:               - Normal larynx.                           - Normal esophagus.                           - Gastritis. Biopsied.                           - Normal examined duodenum.                           Unclear if  gastritis is sufficient to explain                            symptoms. Moderate Sedation:      MAC sedation used Recommendation:           - Patient has a contact number available for                            emergencies. The signs and symptoms of potential                            delayed complications were discussed with the                            patient. Return to normal activities tomorrow.                            Written discharge instructions were provided to the                            patient.                           - Resume previous diet.                           - Continue present medications.                           - Await pathology results.                           - See the other procedure note for documentation of                             additional recommendations. Procedure Code(s):        --- Professional ---                           [phone], Esophagogastroduodenoscopy, flexible,                            transoral; with biopsy, single or multiple Diagnosis Code(s):        --- Professional ---                           K29.70, Gastritis, unspecified, without bleeding                           R10.13, Epigastric pain                           R63.4, Abnormal weight loss CPT copyright 2022 American Medical Association. All rights reserved. The codes documented in this report are preliminary and upon coder review may  be revised to meet current compliance requirements. [name] L. Myrtie Neither, MD 05/20/2023 11:13:54 AM This report has been signed electronically. Number of  Addenda: 0

## 2023-05-20 NOTE — Discharge Instructions (Signed)
YOU HAD AN ENDOSCOPIC PROCEDURE TODAY: Refer to the procedure report and other information in the discharge instructions given to you for any specific questions about what was found during the examination. If this information does not answer your questions, please call [place] office at [phone] to clarify.  ° °YOU SHOULD EXPECT: Some feelings of bloating in the abdomen. Passage of more gas than usual. Walking can help get rid of the air that was put into your GI tract during the procedure and reduce the bloating. If you had a lower endoscopy (such as a colonoscopy or flexible sigmoidoscopy) you may notice spotting of blood in your stool or on the toilet paper. Some abdominal soreness may be present for a day or two, also. ° °DIET: Your first meal following the procedure should be a light meal and then it is ok to progress to your normal diet. A half-sandwich or bowl of soup is an example of a good first meal. Heavy or fried foods are harder to digest and may make you feel nauseous or bloated. Drink plenty of fluids but you should avoid alcoholic beverages for 24 hours. If you had a esophageal dilation, please see attached instructions for diet.   ° °ACTIVITY: Your care partner should take you home directly after the procedure. You should plan to take it easy, moving slowly for the rest of the day. You can resume normal activity the day after the procedure however YOU SHOULD NOT DRIVE, use power tools, machinery or perform tasks that involve climbing or major physical exertion for 24 hours (because of the sedation medicines used during the test).  ° °SYMPTOMS TO REPORT IMMEDIATELY: °A gastroenterologist can be reached at any hour. Please call [phone]  for any of the following symptoms:  °Following lower endoscopy (colonoscopy, flexible sigmoidoscopy) °Excessive amounts of blood in the stool  °Significant tenderness, worsening of abdominal pains  °Swelling of the abdomen that is new, acute  °Fever of 100° or  higher  °Following upper endoscopy (EGD, EUS, ERCP, esophageal dilation) °Vomiting of blood or coffee ground material  °New, significant abdominal pain  °New, significant chest pain or pain under the shoulder blades  °Painful or persistently difficult swallowing  °New shortness of breath  °Black, tarry-looking or red, bloody stools ° °FOLLOW UP:  °If any biopsies were taken you will be contacted by phone or by letter within the next 1-3 weeks. Call [phone]  if you have not heard about the biopsies in 3 weeks.  °Please also call with any specific questions about appointments or follow up tests. ° °

## 2023-05-20 NOTE — H&P (Signed)
History and Physical:  This patient presents for endoscopic testing for: Weight loss, epigastric pain  Here for endoscopic evaluation of above symptoms, clinical details in 04/11/23 office consult note.   CTAP was planned at that visit but not done at this point.  Patient is otherwise without complaints or active issues today.   Past Medical History: Past Medical History:  Diagnosis Date   Anxiety    Aortic atherosclerosis (HCC)    Chronic kidney disease    COPD (chronic obstructive pulmonary disease) (HCC)    Depression    Headache    History of chest pain    History of syncope    Hypotension    Osteoporosis    Palpitations    Panic attacks    Pneumonia    Restless legs syndrome (RLS)      Past Surgical History: Past Surgical History:  Procedure Laterality Date   ABDOMINAL HYSTERECTOMY      Allergies: Allergies  Allergen Reactions   Doxycycline     Chest pain    Outpatient Meds: Current Facility-Administered Medications  Medication Dose Route Frequency Provider Last Rate Last Admin   lactated ringers infusion   Intravenous Continuous Danis, Starr Lake III, MD          ___________________________________________________________________ Objective   Exam:  BP (!) 89/68   Pulse 90   Temp (!) 96.1 F (35.6 C) (Temporal)   Resp 17   Ht 5\' 4"  (1.626 m)   Wt 52.2 kg   SpO2 100%   BMI 19.75 kg/m   CV: regular , S1/S2 Resp: clear to auscultation bilaterally, normal RR and effort noted GI: soft, no tenderness, with active bowel sounds.   Assessment: Epigastric pain Weight loss   Plan: Colonoscopy EGD  The benefits and risks of the planned procedure were described in detail with the patient or (when appropriate) their health care proxy.  Risks were outlined as including, but not limited to, bleeding, infection, perforation, adverse medication reaction leading to cardiac or pulmonary decompensation, pancreatitis (if ERCP).  The limitation of incomplete  mucosal visualization was also discussed.  No guarantees or warranties were given.  The patient is appropriate for an endoscopic procedure in the ambulatory setting.   - Alicia Jupiter, MD

## 2023-05-20 NOTE — Interval H&P Note (Signed)
History and Physical Interval Note:  05/20/2023 10:19 AM  Alicia Sanchez  has presented today for surgery, with the diagnosis of Weight loss, epigastric pain, nausea.  The various methods of treatment have been discussed with the patient and family. After consideration of risks, benefits and other options for treatment, the patient has consented to  Procedure(s): ESOPHAGOGASTRODUODENOSCOPY (EGD) WITH PROPOFOL (N/A) COLONOSCOPY WITH PROPOFOL (N/A) as a surgical intervention.  The patient's history has been reviewed, patient examined, no change in status, stable for surgery.  I have reviewed the patient's chart and labs.  Questions were answered to the patient's satisfaction.     Alicia Sanchez

## 2023-05-20 NOTE — Transfer of Care (Signed)
Immediate Anesthesia Transfer of Care Note  Patient: Alicia Sanchez  Procedure(s) Performed: ESOPHAGOGASTRODUODENOSCOPY (EGD) WITH PROPOFOL COLONOSCOPY WITH PROPOFOL BIOPSY  Patient Location: PACU and Endoscopy Unit  Anesthesia Type:MAC  Level of Consciousness: awake, alert , oriented, and patient cooperative  Airway & Oxygen Therapy: Patient Spontanous Breathing and Patient connected to face mask oxygen  Post-op Assessment: Report given to RN, Post -op Vital signs reviewed and stable, and Patient moving all extremities  Post vital signs: Reviewed and stable  Last Vitals:  Vitals Value Taken Time  BP    Temp    Pulse    Resp    SpO2      Last Pain:  Vitals:   05/20/23 1004  TempSrc: Temporal  PainSc: 0-No pain         Complications: No notable events documented.

## 2023-05-20 NOTE — Op Note (Signed)
[employer] Patient Name: Alicia Sanchez Procedure Date: 05/20/2023 MRN: [id number] Attending MD: Starr Lake. Myrtie Neither , MD, [id number] Date of Birth: Nov 29, 1960 CSN: [id number] Age: 62 Admit Type: Outpatient Procedure:                Colonoscopy Indications:              Epigastric abdominal pain, Weight loss Providers:                Sherilyn Cooter L. Myrtie Neither, MD, Adolph Pollack, RN, Rozetta Nunnery, Technician Referring MD:             Rozell Searing. Gottschalk Medicines:                Monitored Anesthesia Care Complications:            No immediate complications. Estimated Blood Loss:     Estimated blood loss: none. Procedure:                Pre-Anesthesia Assessment:                           - Prior to the procedure, a History and Physical                            was performed, and patient medications and                            allergies were reviewed. The patient's tolerance of                            previous anesthesia was also reviewed. The risks                            and benefits of the procedure and the sedation                            options and risks were discussed with the patient.                            All questions were answered, and informed consent                            was obtained. Prior Anticoagulants: The patient has                            taken no anticoagulant or antiplatelet agents. ASA                            Grade Assessment: IV - A patient with severe                            systemic disease that is a constant threat to life.  After reviewing the risks and benefits, the patient                            was deemed in satisfactory condition to undergo the                            procedure.                           After obtaining informed consent, the colonoscope                            was passed under direct vision. Throughout the                             procedure, the patient's blood pressure, pulse, and                            oxygen saturations were monitored continuously. The                            CF-HQ190L ([id number]) Olympus colonoscope was                            introduced through the anus and advanced to the the                            cecum, identified by appendiceal orifice and                            ileocecal valve. The colonoscopy was performed with                            difficulty due to a fair prep, redundant colon and                            a tortuous colon. Successful completion of the                            procedure was aided by using manual pressure,                            straightening and shortening the scope to obtain                            bowel loop reduction and lavage. The patient                            tolerated the procedure well. The quality of the                            bowel preparation was fair despite lavage,  particularly in the right colon. The ileocecal                            valve, appendiceal orifice, and rectum were                            photographed. The TI could not be intubated due to                            scope looping. Scope In: 10:47:54 AM Scope Out: 11:07:39 AM Scope Withdrawal Time: 0 hours 12 minutes 51 seconds  Total Procedure Duration: 0 hours 19 minutes 45 seconds  Findings:      The perianal and digital rectal examinations were normal.      The sigmoid colon and descending colon were significantly redundant and       tortuous.      The exam was otherwise without abnormality on direct and retroflexion       views. Impression:               - Preparation of the colon was fair.                           - Redundant colon.                           - The examination was otherwise normal on direct                            and retroflexion views.                           - No specimens collected.                            No clear cause of weight loss on EGD or colonoscopy. Moderate Sedation:      MAC sedation used Recommendation:           - Patient has a contact number available for                            emergencies. The signs and symptoms of potential                            delayed complications were discussed with the                            patient. Return to normal activities tomorrow.                            Written discharge instructions were provided to the                            patient.                           - Resume previous diet.                           -  Continue present medications.                           - Repeat colonoscopy in 3 years for screening                            purposes.                           - See the other procedure note for documentation of                            additional recommendations.                           - Perform a CT scan (computed tomography) of                            abdomen with contrast and pelvis with contrast at                            appointment to be scheduled. Procedure Code(s):        --- Professional ---                           [phone], Colonoscopy, flexible; diagnostic, including                            collection of specimen(s) by brushing or washing,                            when performed (separate procedure) Diagnosis Code(s):        --- Professional ---                           R10.13, Epigastric pain                           R63.4, Abnormal weight loss                           Q43.8, Other specified congenital malformations of                            intestine CPT copyright 2022 American Medical [employer]. All rights reserved. The codes documented in this report are preliminary and upon coder review may  be revised to meet current compliance requirements. Jency Schnieders L. Myrtie Neither, MD 05/20/2023 11:19:10 AM This report has been signed electronically. Number of Addenda: 0

## 2023-05-21 LAB — SURGICAL PATHOLOGY

## 2023-05-21 NOTE — Anesthesia Postprocedure Evaluation (Signed)
Anesthesia Post Note  Patient: Alicia Sanchez  Procedure(s) Performed: ESOPHAGOGASTRODUODENOSCOPY (EGD) WITH PROPOFOL COLONOSCOPY WITH PROPOFOL BIOPSY     Patient location during evaluation: Endoscopy Anesthesia Type: MAC Level of consciousness: awake Pain management: pain level controlled Vital Signs Assessment: post-procedure vital signs reviewed and stable Respiratory status: spontaneous breathing, nonlabored ventilation and respiratory function stable Cardiovascular status: blood pressure returned to baseline and stable Postop Assessment: no apparent nausea or vomiting Anesthetic complications: no   No notable events documented.  Last Vitals:  Vitals:   05/20/23 1123 05/20/23 1134  BP: 106/65 (!) 103/51  Pulse: 73 73  Resp: (!) 21 20  Temp:    SpO2: 98% 98%    Last Pain:  Vitals:   05/20/23 1134  TempSrc:   PainSc: 0-No pain                 Lovely Kerins P Quadir Muns

## 2023-05-22 ENCOUNTER — Encounter: Payer: Self-pay | Admitting: Gastroenterology

## 2023-05-23 ENCOUNTER — Encounter (HOSPITAL_COMMUNITY): Payer: Self-pay | Admitting: Gastroenterology

## 2023-05-30 ENCOUNTER — Other Ambulatory Visit: Payer: Self-pay | Admitting: Family Medicine

## 2023-05-30 DIAGNOSIS — F331 Major depressive disorder, recurrent, moderate: Secondary | ICD-10-CM

## 2023-05-30 NOTE — Telephone Encounter (Signed)
Made first availabe appt  in Aug

## 2023-05-30 NOTE — Telephone Encounter (Signed)
Gottschalk NTBS for 2 mos FU from May. RF sent to pharmacy

## 2023-06-05 ENCOUNTER — Other Ambulatory Visit: Payer: Self-pay | Admitting: Adult Health

## 2023-06-05 DIAGNOSIS — F410 Panic disorder [episodic paroxysmal anxiety] without agoraphobia: Secondary | ICD-10-CM

## 2023-06-05 DIAGNOSIS — F411 Generalized anxiety disorder: Secondary | ICD-10-CM

## 2023-06-06 ENCOUNTER — Other Ambulatory Visit: Payer: Medicare HMO

## 2023-06-06 ENCOUNTER — Ambulatory Visit (HOSPITAL_COMMUNITY)
Admission: RE | Admit: 2023-06-06 | Discharge: 2023-06-06 | Disposition: A | Discharge status: Home / Self Care | Payer: Medicare HMO | Source: Ambulatory Visit | Attending: Gastroenterology | Admitting: Gastroenterology

## 2023-06-06 DIAGNOSIS — K760 Fatty (change of) liver, not elsewhere classified: Secondary | ICD-10-CM | POA: Diagnosis not present

## 2023-06-06 DIAGNOSIS — G8929 Other chronic pain: Secondary | ICD-10-CM | POA: Insufficient documentation

## 2023-06-06 DIAGNOSIS — M810 Age-related osteoporosis without current pathological fracture: Secondary | ICD-10-CM | POA: Diagnosis not present

## 2023-06-06 DIAGNOSIS — R110 Nausea: Secondary | ICD-10-CM | POA: Insufficient documentation

## 2023-06-06 DIAGNOSIS — N281 Cyst of kidney, acquired: Secondary | ICD-10-CM | POA: Diagnosis not present

## 2023-06-06 DIAGNOSIS — R1013 Epigastric pain: Secondary | ICD-10-CM | POA: Diagnosis not present

## 2023-06-06 DIAGNOSIS — R634 Abnormal weight loss: Secondary | ICD-10-CM | POA: Insufficient documentation

## 2023-06-06 MED ORDER — SODIUM CHLORIDE (PF) 0.9 % IJ SOLN
INTRAMUSCULAR | Status: AC
  Filled 2023-06-06: qty 50

## 2023-06-06 MED ORDER — IOHEXOL 9 MG/ML PO SOLN
500.0000 mL | ORAL | Status: AC
  Administered 2023-06-06 (×2): 500 mL via ORAL

## 2023-06-06 MED ORDER — IOHEXOL 9 MG/ML PO SOLN
ORAL | Status: AC
  Filled 2023-06-06: qty 1000

## 2023-06-06 MED ORDER — IOHEXOL 300 MG/ML  SOLN
100.0000 mL | Freq: Once | INTRAMUSCULAR | Status: AC | PRN
  Administered 2023-06-06: 100 mL via INTRAVENOUS

## 2023-06-07 LAB — VITAMIN D 25 HYDROXY (VIT D DEFICIENCY, FRACTURES): Vit D, 25-Hydroxy: 55.6 ng/mL (ref 30.0–100.0)

## 2023-06-10 NOTE — Progress Notes (Signed)
Patient r/c  

## 2023-06-12 ENCOUNTER — Other Ambulatory Visit: Payer: Medicare HMO

## 2023-07-03 ENCOUNTER — Other Ambulatory Visit: Payer: Self-pay

## 2023-07-03 DIAGNOSIS — Z1231 Encounter for screening mammogram for malignant neoplasm of breast: Secondary | ICD-10-CM

## 2023-07-15 ENCOUNTER — Other Ambulatory Visit: Payer: Self-pay | Admitting: Gastroenterology

## 2023-07-16 ENCOUNTER — Ambulatory Visit (INDEPENDENT_AMBULATORY_CARE_PROVIDER_SITE_OTHER): Payer: Medicare HMO

## 2023-07-16 ENCOUNTER — Encounter: Payer: Self-pay | Admitting: Family Medicine

## 2023-07-16 ENCOUNTER — Ambulatory Visit (INDEPENDENT_AMBULATORY_CARE_PROVIDER_SITE_OTHER): Payer: Medicare HMO | Admitting: Family Medicine

## 2023-07-16 VITALS — BP 123/78 | HR 94 | Temp 98.3°F | Ht 64.0 in | Wt 112.0 lb

## 2023-07-16 DIAGNOSIS — G2581 Restless legs syndrome: Secondary | ICD-10-CM

## 2023-07-16 DIAGNOSIS — M810 Age-related osteoporosis without current pathological fracture: Secondary | ICD-10-CM

## 2023-07-16 DIAGNOSIS — F331 Major depressive disorder, recurrent, moderate: Secondary | ICD-10-CM

## 2023-07-16 DIAGNOSIS — Z87448 Personal history of other diseases of urinary system: Secondary | ICD-10-CM | POA: Diagnosis not present

## 2023-07-16 DIAGNOSIS — N3000 Acute cystitis without hematuria: Secondary | ICD-10-CM

## 2023-07-16 DIAGNOSIS — R350 Frequency of micturition: Secondary | ICD-10-CM | POA: Diagnosis not present

## 2023-07-16 LAB — MICROSCOPIC EXAMINATION
Renal Epithel, UA: NONE SEEN /hpf
Yeast, UA: NONE SEEN

## 2023-07-16 LAB — URINALYSIS, ROUTINE W REFLEX MICROSCOPIC
Bilirubin, UA: NEGATIVE
Glucose, UA: NEGATIVE
Ketones, UA: NEGATIVE
Nitrite, UA: POSITIVE — AB
Protein,UA: NEGATIVE
Specific Gravity, UA: 1.01 (ref 1.005–1.030)
Urobilinogen, Ur: 0.2 mg/dL (ref 0.2–1.0)
pH, UA: 7 (ref 5.0–7.5)

## 2023-07-16 MED ORDER — CEPHALEXIN 500 MG PO CAPS
500.0000 mg | ORAL_CAPSULE | Freq: Two times a day (BID) | ORAL | 0 refills | Status: AC
Start: 2023-07-16 — End: 2023-07-23

## 2023-07-16 MED ORDER — DULOXETINE HCL 60 MG PO CPEP
120.0000 mg | ORAL_CAPSULE | Freq: Every day | ORAL | 3 refills | Status: DC
Start: 2023-07-16 — End: 2024-01-17

## 2023-07-16 MED ORDER — ROPINIROLE HCL 3 MG PO TABS
3.0000 mg | ORAL_TABLET | Freq: Every day | ORAL | 3 refills | Status: DC
Start: 2023-07-16 — End: 2024-01-17

## 2023-07-16 MED ORDER — ATORVASTATIN CALCIUM 40 MG PO TABS: ORAL_TABLET | ORAL | 3 refills | Status: DC

## 2023-07-16 NOTE — Patient Instructions (Addendum)
I changed your restless leg medication to the 3mg  strength for convenience. Pills will look different. Just need to take 1 at bedtime now.  Overactive Bladder, Adult  Overactive bladder is a condition in which a person has a sudden and frequent need to urinate. A person might also leak urine if he or she cannot get to the bathroom fast enough (urinary incontinence). Sometimes, symptoms can interfere with work or social activities. What are the causes? Overactive bladder is associated with poor nerve signals between your bladder and your brain. Your bladder may get the signal to empty before it is full. You may also have very sensitive muscles that make your bladder squeeze too soon. This condition may also be caused by other factors, such as: Medical conditions: Urinary tract infection. Infection of nearby tissues. Prostate enlargement. Bladder stones, inflammation, or tumors. Diabetes. Muscle or nerve weakness, especially from these conditions: A spinal cord injury. Stroke. Multiple sclerosis. Parkinson's disease. Other causes: Surgery on the uterus or urethra. Drinking too much caffeine or alcohol. Certain medicines, especially those that eliminate extra fluid in the body (diuretics). Constipation. What increases the risk? You may be at greater risk for overactive bladder if you: Are an older adult. Smoke. Are going through menopause. Have prostate problems. Have a neurological disease, such as stroke, dementia, Parkinson's disease, or multiple sclerosis (MS). Eat or drink alcohol, spicy food, caffeine, and other things that irritate the bladder. Are overweight or obese. What are the signs or symptoms? Symptoms of this condition include a sudden, strong urge to urinate. Other symptoms include: Leaking urine. Urinating 8 or more times a day. Waking up to urinate 2 or more times overnight. How is this diagnosed? This condition may be diagnosed based on: Your symptoms and  medical history. A physical exam. Blood or urine tests to check for possible causes, such as infection. You may also need to see a health care provider who specializes in urinary tract problems. This is called a urologist. How is this treated? Treatment for overactive bladder depends on the cause of your condition and whether it is mild or severe. Treatment may include: Bladder training, such as: Learning to control the urge to urinate by following a schedule to urinate at regular intervals. Doing Kegel exercises to strengthen the pelvic floor muscles that support your bladder. Special devices, such as: Biofeedback. This uses sensors to help you become aware of your body's signals. Electrical stimulation. This uses electrodes placed inside the body (implanted) or outside the body. These electrodes send gentle pulses of electricity to strengthen the nerves or muscles that control the bladder. Women may use a plastic device, called a pessary, that fits into the vagina and supports the bladder. Medicines, such as: Antibiotics to treat bladder infection. Antispasmodics to stop the bladder from releasing urine at the wrong time. Tricyclic antidepressants to relax bladder muscles. Injections of botulinum toxin type A directly into the bladder tissue to relax bladder muscles. Surgery, such as: A device may be implanted to help manage the nerve signals that control urination. An electrode may be implanted to stimulate electrical signals in the bladder. A procedure may be done to change the shape of the bladder. This is done only in very severe cases. Follow these instructions at home: Eating and drinking  Make diet or lifestyle changes recommended by your health care provider. These may include: Drinking fluids throughout the day and not only with meals. Cutting down on caffeine or alcohol. Eating a healthy and balanced diet to  prevent constipation. This may include: Choosing foods that are high  in fiber, such as beans, whole grains, and fresh fruits and vegetables. Limiting foods that are high in fat and processed sugars, such as fried and sweet foods. Lifestyle  Lose weight if needed. Do not use any products that contain nicotine or tobacco. These include cigarettes, chewing tobacco, and vaping devices, such as e-cigarettes. If you need help quitting, ask your health care provider. General instructions Take over-the-counter and prescription medicines only as told by your health care provider. If you were prescribed an antibiotic medicine, take it as told by your health care provider. Do not stop taking the antibiotic even if you start to feel better. Use any implants or pessary as told by your health care provider. If needed, wear pads to absorb urine leakage. Keep a log to track how much and when you drink, and when you need to urinate. This will help your health care provider monitor your condition. Keep all follow-up visits. This is important. Contact a health care provider if: You have a fever or chills. Your symptoms do not get better with treatment. Your pain and discomfort get worse. You have more frequent urges to urinate. Get help right away if: You are not able to control your bladder. Summary Overactive bladder refers to a condition in which a person has a sudden and frequent need to urinate. Several conditions may lead to an overactive bladder. Treatment for overactive bladder depends on the cause and severity of your condition. Making lifestyle changes, doing Kegel exercises, keeping a log, and taking medicines can help with this condition. This information is not intended to replace advice given to you by your health care provider. Make sure you discuss any questions you have with your health care provider. Document Revised: 07/25/2020 Document Reviewed: 07/25/2020 Elsevier Patient Education  2024 [employer].
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
## 2023-07-17 ENCOUNTER — Other Ambulatory Visit: Payer: Self-pay | Admitting: Family Medicine

## 2023-07-17 DIAGNOSIS — Z780 Asymptomatic menopausal state: Secondary | ICD-10-CM | POA: Diagnosis not present

## 2023-07-17 DIAGNOSIS — M8589 Other specified disorders of bone density and structure, multiple sites: Secondary | ICD-10-CM | POA: Diagnosis not present

## 2023-07-17 MED ORDER — ALENDRONATE SODIUM 70 MG PO TABS: ORAL_TABLET | ORAL | 3 refills | Status: DC

## 2023-07-18 LAB — URINE CULTURE

## 2023-07-20 ENCOUNTER — Other Ambulatory Visit: Payer: Self-pay | Admitting: Cardiology

## 2023-07-20 ENCOUNTER — Other Ambulatory Visit: Payer: Self-pay | Admitting: Family Medicine

## 2023-07-24 ENCOUNTER — Telehealth: Payer: Medicare HMO | Admitting: Adult Health

## 2023-07-24 ENCOUNTER — Encounter: Payer: Self-pay | Admitting: Adult Health

## 2023-07-24 DIAGNOSIS — F331 Major depressive disorder, recurrent, moderate: Secondary | ICD-10-CM

## 2023-07-24 DIAGNOSIS — F411 Generalized anxiety disorder: Secondary | ICD-10-CM | POA: Diagnosis not present

## 2023-07-24 DIAGNOSIS — F4310 Post-traumatic stress disorder, unspecified: Secondary | ICD-10-CM | POA: Diagnosis not present

## 2023-07-24 DIAGNOSIS — G4700 Insomnia, unspecified: Secondary | ICD-10-CM | POA: Diagnosis not present

## 2023-07-24 DIAGNOSIS — F410 Panic disorder [episodic paroxysmal anxiety] without agoraphobia: Secondary | ICD-10-CM

## 2023-07-24 MED ORDER — ALPRAZOLAM 0.5 MG PO TABS
ORAL_TABLET | ORAL | 2 refills | Status: DC
Start: 2023-07-24 — End: 2023-09-02

## 2023-07-24 NOTE — Progress Notes (Signed)
Alicia Sanchez [id number] 1961/03/06 62 y.o.  Virtual Visit via Video Note  I connected with pt @ on 07/24/23 at  1:00 PM EDT by a video enabled telemedicine application and verified that I am speaking with the correct person using two identifiers.   I discussed the limitations of evaluation and management by telemedicine and the availability of in person appointments. The patient expressed understanding and agreed to proceed.  I discussed the assessment and treatment plan with the patient. The patient was provided an opportunity to ask questions and all were answered. The patient agreed with the plan and demonstrated an understanding of the instructions.   The patient was advised to call back or seek an in-person evaluation if the symptoms worsen or if the condition fails to improve as anticipated.  I provided 25 minutes of non-face-to-face time during this encounter.  The patient was located at home.  The provider was located at [facility].   Dorothyann Gibbs, NP   Subjective:   Patient ID:  Alicia Sanchez is a 62 y.o. (DOB 1961-04-07) female.  Chief Complaint: No chief complaint on file.   HPI Alicia Sanchez presents for follow-up of GAD, MDD, PTSD, insomnia and panic attacks.  Describes mood today as "ok". Pleasant. Tearful. Mood symptoms - reports decreased depression - "some, nothing like it was". Reports anxiety - "gets the shakes". Denies irritability - "a little". Reports decreased panic attacks - "not as bad". Mood is more consistent. Stating "I feel like I'm doing good". Spending time with family. Stable interest and motivation. Taking medication prescribed.                                                                                  Energy levels "ok". Active, does not have a regular exercise routine.   Enjoys some usual interests and activities. Married, but legally separated. Has two daughters and 2 step daughters. Has 3 sisters and 2 brothers. Mother living  - talks to her. Spending time with family. Involved in church. Appetite adequate. Weight loss - 174 to 113 pounds. Sleeping better at night. Averages 8 or more hours. Focus and concentration stable. Completing tasks. Managing aspects of household. Disabled since 2020.  Denies SI or HI.  Denies AH or VH. Denies self harm. Denies substance use.  Previous medication trials: Paxil, Wellbutrin, Depakote, Trazadone and others   Review of Systems:  Review of Systems  Musculoskeletal:  Negative for gait problem.  Neurological:  Negative for tremors.  Psychiatric/Behavioral:         Please refer to HPI    Medications: I have reviewed the patient's current medications.  Current Outpatient Medications  Medication Sig Dispense Refill   acetaminophen (TYLENOL) 500 MG tablet Take 1,000 mg by mouth every 6 (six) hours as needed for moderate pain.     albuterol (VENTOLIN HFA) 108 (90 Base) MCG/ACT inhaler INHALE 2 PUFFS INTO THE LUNGS EVERY 6 (SIX) HOURS AS NEEDED FOR WHEEZING OR SHORTNESS OF BREATH. 3 each 3   alendronate (FOSAMAX) 70 MG tablet TAKE 1 TABLET EVERY 7 DAYS WITH A FULL GLASS OF WATER ON AN EMPTY STOMACH 12 tablet 3   ALPRAZolam (XANAX) 0.5 MG tablet  TAKE 1 TABLET BY MOUTH THREE TIMES DAILY AND 1 AS NEEDED FOR INCREASE ANXIETY 120 tablet 1   aspirin EC 81 MG tablet Take 81 mg by mouth daily.     atorvastatin (LIPITOR) 40 MG tablet TAKE 1 TABLET EVERY DAY 90 tablet 3   Budeson-Glycopyrrol-Formoterol (BREZTRI AEROSPHERE) 160-9-4.8 MCG/ACT AERO Inhale 2 puffs into the lungs 2 (two) times daily. 10.7 g 11   Calcium-Magnesium-Zinc (CAL-MAG-ZINC PO) Take 1 tablet by mouth daily.     cetirizine (ZYRTEC) 10 MG tablet Take 10 mg by mouth daily.     Cholecalciferol (VITAMIN D3 MAXIMUM STRENGTH) 125 MCG (5000 UT) capsule Take 5,000 Units by mouth daily.     DULoxetine (CYMBALTA) 60 MG capsule Take 2 capsules (120 mg total) by mouth daily. 180 capsule 3   ipratropium-albuterol (DUONEB) 0.5-2.5  (3) MG/3ML SOLN INHALE THE CONTENTS OF 1 VIAL VIA NEBULIZER EVERY 6 HOURS AS NEEDED (Patient taking differently: Take 3 mLs by nebulization every 4 (four) hours as needed (Shortness of breath).) 360 mL 11   meloxicam (MOBIC) 7.5 MG tablet Take 1 tablet (7.5 mg total) by mouth daily. 90 tablet 3   metoprolol tartrate (LOPRESSOR) 25 MG tablet Take 0.5 tablets (12.5 mg total) by mouth 2 (two) times daily as needed (palpitations). 30 tablet 2   midodrine (PROAMATINE) 2.5 MG tablet Take 2.5 mg by mouth 3 (three) times daily as needed (bp below 90/60).     nitroGLYCERIN (NITROSTAT) 0.4 MG SL tablet Place 1 tablet (0.4 mg total) under the tongue every 5 (five) minutes x 3 doses as needed for chest pain (if o relief after 2nd dose, proceed to the ED for an evaluation or call 911). 25 tablet 2   omeprazole (PRILOSEC) 40 MG capsule Take 1 capsule (40 mg total) by mouth daily. 30 capsule 3   QUEtiapine (SEROQUEL) 200 MG tablet Take 200 mg by mouth at bedtime.     Respiratory Therapy Supplies (NEBULIZER/TUBING/MOUTHPIECE) KIT 1 Units by Does not apply route 4 (four) times daily. GIVE face mask and tubing for nebulizer. J44.1 1 kit 0   rOPINIRole (REQUIP) 3 MG tablet Take 1 tablet (3 mg total) by mouth at bedtime. 90 tablet 3   Varenicline Tartrate, Starter, (CHANTIX STARTING MONTH PAK) 0.5 MG X 11 & 1 MG X 42 TBPK Take as instructed 53 each 0   No current facility-administered medications for this visit.    Medication Side Effects: None  Allergies:  Allergies  Allergen Reactions   Doxycycline     Chest pain    Past Medical History:  Diagnosis Date   Anxiety    Aortic atherosclerosis (HCC)    Chronic kidney disease    COPD (chronic obstructive pulmonary disease) (HCC)    Depression    Headache    History of chest pain    History of syncope    Hypotension    Osteoporosis    Palpitations    Panic attacks    Pneumonia    Restless legs syndrome (RLS)     Family History  Problem Relation Age  of Onset   COPD Mother    COPD Father    Diabetes type II Sister    Kidney failure Sister    Diabetes type II Brother    COPD Maternal Grandfather    Breast cancer Neg Hx    Liver disease Neg Hx    Esophageal cancer Neg Hx    Colon cancer Neg Hx     Social History  Socioeconomic History   Marital status: Married    Spouse name: Not on file   Number of children: 2   Years of education: Not on file   Highest education level: Not on file  Occupational History   Not on file  Tobacco Use   Smoking status: Every Day    Current packs/day: 0.50    Average packs/day: 0.5 packs/day for 42.0 years (21.0 ttl pk-yrs)    Types: Cigarettes   Smokeless tobacco: Never   Tobacco comments:    1/2 pack daily  Vaping Use   Vaping status: Never Used  Substance and Sexual Activity   Alcohol use: Yes    Comment: soical   Drug use: Yes    Frequency: 1.0 times per week    Types: Marijuana   Sexual activity: Yes    Birth control/protection: Surgical  Other Topics Concern   Not on file  Social History Narrative   Not on file   Social Determinants of Health   Financial Resource Strain: Low Risk  (12/20/2022)   Overall Financial Resource Strain (CARDIA)    Difficulty of Paying Living Expenses: Not hard at all  Food Insecurity: No Food Insecurity (03/18/2023)   Hunger Vital Sign    Worried About Running Out of Food in the Last Year: Never true    Ran Out of Food in the Last Year: Never true  Transportation Needs: No Transportation Needs (03/18/2023)   PRAPARE - Administrator, Civil Service (Medical): No    Lack of Transportation (Non-Medical): No  Physical Activity: Inactive (12/20/2022)   Exercise Vital Sign    Days of Exercise per Week: 0 days    Minutes of Exercise per Session: 0 min  Stress: No Stress Concern Present (12/20/2022)   [employer] of Occupational Health - Occupational Stress Questionnaire    Feeling of Stress : Not at all  Social Connections: Moderately  Isolated (12/20/2022)   Social Connection and Isolation Panel [NHANES]    Frequency of Communication with Friends and Family: More than three times a week    Frequency of Social Gatherings with Friends and Family: More than three times a week    Attends Religious Services: More than 4 times per year    Active Member of [employer] or Organizations: No    Attends Banker Meetings: Never    Marital Status: Separated  Intimate Partner Violence: Not At Risk (03/12/2023)   Humiliation, Afraid, Rape, and Kick questionnaire    Fear of Current or Ex-Partner: No    Emotionally Abused: No    Physically Abused: No    Sexually Abused: No    Past Medical History, Surgical history, Social history, and Family history were reviewed and updated as appropriate.   Please see review of systems for further details on the patient's review from today.   Objective:   Physical Exam:  There were no vitals taken for this visit.  Physical Exam Constitutional:      General: She is not in acute distress. Musculoskeletal:        General: No deformity.  Neurological:     Mental Status: She is alert and oriented to person, place, and time.     Coordination: Coordination normal.  Psychiatric:        Attention and Perception: Attention and perception normal. She does not perceive auditory or visual hallucinations.        Mood and Affect: Mood normal. Mood is not anxious or depressed. Affect is not  labile, blunt, angry or inappropriate.        Speech: Speech normal.        Behavior: Behavior normal.        Thought Content: Thought content normal. Thought content is not paranoid or delusional. Thought content does not include homicidal or suicidal ideation. Thought content does not include homicidal or suicidal plan.        Cognition and Memory: Cognition and memory normal.        Judgment: Judgment normal.     Comments: Insight intact     Lab Review:     Component Value Date/Time   NA 142 03/25/2023  1144   K 4.6 03/25/2023 1144   CL 102 03/25/2023 1144   CO2 23 03/25/2023 1144   GLUCOSE 67 (L) 03/25/2023 1144   GLUCOSE 110 (H) 03/13/2023 0351   BUN 12 03/25/2023 1144   CREATININE 0.87 03/25/2023 1144   CALCIUM 9.1 03/25/2023 1144   PROT 6.6 03/12/2023 0330   PROT 6.9 02/04/2023 1456   ALBUMIN 3.4 (L) 03/12/2023 0330   ALBUMIN 4.6 02/04/2023 1456   AST 25 03/12/2023 0330   ALT 15 03/12/2023 0330   ALKPHOS 57 03/12/2023 0330   BILITOT 0.6 03/12/2023 0330   BILITOT 0.7 02/04/2023 1456   GFRNONAA >60 03/13/2023 0351   GFRAA 91 06/03/2020 1505       Component Value Date/Time   WBC 9.0 03/25/2023 1144   WBC 9.6 03/14/2023 0518   RBC 4.94 03/25/2023 1144   RBC 4.31 03/14/2023 0518   HGB 15.3 03/25/2023 1144   HCT 45.1 03/25/2023 1144   PLT 374 03/25/2023 1144   MCV 91 03/25/2023 1144   MCH 31.0 03/25/2023 1144   MCH 30.9 03/14/2023 0518   MCHC 33.9 03/25/2023 1144   MCHC 33.4 03/14/2023 0518   RDW 12.8 03/25/2023 1144   LYMPHSABS 2.8 02/04/2023 1456   MONOABS 0.5 05/25/2018 0602   EOSABS 0.0 02/04/2023 1456   BASOSABS 0.1 02/04/2023 1456    No results found for: "POCLITH", "LITHIUM"   No results found for: "PHENYTOIN", "PHENOBARB", "VALPROATE", "CBMZ"   .res Assessment: Plan:    Plan:  PDMP reviewed  Xanax 0.5mg  - 4 times daily Seroquel 200mg  at hs   PCP: Cymbalta 120mg  every morning Buspar 30mg  BID Gabapentin 100mg  at hs - leg/feet cramps  Refer to therapist  RTC 3 months  Patient advised to contact office with any questions, adverse effects, or acute worsening in signs and symptoms.  Discussed potential benefits, risk, and side effects of benzodiazepines to include potential risk of tolerance and dependence, as well as possible drowsiness. Advised patient not to drive if experiencing drowsiness and to take lowest possible effective dose to minimize risk of dependence and tolerance.  There are no diagnoses linked to this encounter.   Please see  After Visit Summary for patient specific instructions.  Future Appointments  Date Time Provider Department Center  07/24/2023  1:00 PM Carlas Vandyne, Thereasa Solo, NP [facility] None  10/08/2023  1:00 PM Antoine Poche, MD [facility]  12/23/2023 10:30 AM [facility]-ANNUAL WELLNESS VISIT [facility] None  01/17/2024 11:00 AM [name], DO [facility] None    No orders of the defined types were placed in this encounter.     -------------------------------

## 2023-08-02 ENCOUNTER — Other Ambulatory Visit: Payer: Self-pay | Admitting: Adult Health

## 2023-08-02 DIAGNOSIS — F411 Generalized anxiety disorder: Secondary | ICD-10-CM

## 2023-08-02 DIAGNOSIS — F410 Panic disorder [episodic paroxysmal anxiety] without agoraphobia: Secondary | ICD-10-CM

## 2023-08-11 ENCOUNTER — Other Ambulatory Visit: Payer: Self-pay | Admitting: Pulmonary Disease

## 2023-08-11 DIAGNOSIS — F331 Major depressive disorder, recurrent, moderate: Secondary | ICD-10-CM

## 2023-08-21 ENCOUNTER — Other Ambulatory Visit: Payer: Self-pay | Admitting: Family Medicine

## 2023-08-21 ENCOUNTER — Other Ambulatory Visit: Payer: Self-pay | Admitting: Adult Health

## 2023-08-23 ENCOUNTER — Encounter: Payer: Self-pay | Admitting: Nurse Practitioner

## 2023-08-23 ENCOUNTER — Ambulatory Visit (INDEPENDENT_AMBULATORY_CARE_PROVIDER_SITE_OTHER): Payer: Medicare HMO | Admitting: Nurse Practitioner

## 2023-08-23 VITALS — BP 113/76 | HR 80 | Temp 98.0°F | Resp 20 | Ht 64.0 in | Wt 117.0 lb

## 2023-08-23 DIAGNOSIS — J209 Acute bronchitis, unspecified: Secondary | ICD-10-CM | POA: Diagnosis not present

## 2023-08-23 DIAGNOSIS — J42 Unspecified chronic bronchitis: Secondary | ICD-10-CM | POA: Diagnosis not present

## 2023-08-23 MED ORDER — PREDNISONE 20 MG PO TABS
40.0000 mg | ORAL_TABLET | Freq: Every day | ORAL | 0 refills | Status: AC
Start: 2023-08-23 — End: 2023-08-28

## 2023-08-23 MED ORDER — AZITHROMYCIN 250 MG PO TABS: ORAL_TABLET | ORAL | 0 refills | Status: DC

## 2023-08-23 MED ORDER — HYDROCODONE BIT-HOMATROP MBR 5-1.5 MG/5ML PO SOLN: 5.0000 mL | Freq: Four times a day (QID) | ORAL | 0 refills | Status: DC | PRN

## 2023-08-23 NOTE — Progress Notes (Signed)
Subjective:    Patient ID: Alicia Sanchez, female    DOB: [birth year], 62 y.o.   MRN: [id number]   Chief Complaint: Cough and Nasal Congestion   Sinusitis This is a new problem. The current episode started in the past 7 days. The problem has been rapidly improving since onset. There has been no fever. Her pain is at a severity of 4/10. Associated symptoms include congestion, coughing, headaches and sinus pressure.    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   Other spondylosis with radiculopathy, cervical region 12/21/2018   Chronic left shoulder pain 11/24/2018   Arthritis 11/24/2018   Stress incontinence of urine 08/12/2018   Actinic keratosis 07/17/2018   Chronic respiratory failure with hypoxia (HCC) 07/16/2018   Abnormal findings on diagnostic imaging of lung 06/18/2018   Acute on chronic respiratory failure with hypoxia (HCC)    Lung nodules 08/21/2017   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   Respiratory:  Positive for cough.   Neurological:  Positive for headaches.       Objective:   Physical Exam Constitutional:      Appearance: Normal appearance.  HENT:      Right Ear: Tympanic membrane normal.     Left Ear: Tympanic membrane normal.     Nose: Congestion and rhinorrhea present.     Mouth/Throat:     Pharynx: Posterior oropharyngeal erythema (mild) present. No oropharyngeal exudate.  Cardiovascular:     Rate and Rhythm: Normal rate and regular rhythm.     Heart sounds: Normal heart sounds.  Pulmonary:     Effort: Pulmonary effort is normal.     Breath sounds: Rhonchi present.  Musculoskeletal:     Cervical back: Normal range of motion and neck supple.  Skin:    General: Skin is warm.  Neurological:     General: No focal deficit present.     Mental Status: She is alert and oriented to person, place, and time.  Psychiatric:        Mood and Affect: Mood normal.        Behavior: Behavior normal.     BP 113/76   Pulse 80   Temp 98 F (36.7 C) (Temporal)   Resp 20   Ht 5\' 4"  (1.626 m)   Wt 117 lb (53.1 kg)   SpO2 94%   BMI 20.08 kg/m        Assessment & Plan:   Dimitri Hinderman in today with chief complaint of Cough and Nasal Congestion   1. Acute exacerbation of chronic bronchitis (HCC) Use nebulizer machine as needed 1. Take meds as prescribed 2. Use a cool mist humidifier especially during the winter months  and when heat has been humid. 3. Use saline nose sprays frequently 4. Saline irrigations of the nose can be very helpful if done frequently.  * 4X daily for 1 week*  * Use of a nettie pot can be helpful with this. Follow directions with this* 5. Drink plenty of fluids 6. Keep thermostat turn down low 7.For any cough or congestion- hycodan 8. For fever or aces or pains- take tylenol or ibuprofen appropriate for age and weight.  * for fevers greater than 101 orally you may alternate ibuprofen and tylenol every  3 hours.    - azithromycin (ZITHROMAX Z-PAK) 250 MG tablet; As directed  Dispense: 6 tablet; Refill: 0 - predniSONE (DELTASONE) 20 MG tablet; Take 2 tablets (40 mg total) by mouth daily with breakfast for 5  days. 2 po daily for 5 days  Dispense: 10 tablet; Refill: 0 - HYDROcodone bit-homatropine (HYCODAN) 5-1.5 MG/5ML syrup; Take 5 mLs by mouth every 6 (six) hours as needed for cough.  Dispense: 120 mL; Refill: 0    The above assessment and management plan was discussed with the patient. The patient verbalized understanding of and has agreed to the management plan. Patient is aware to call the clinic if symptoms persist or worsen. Patient is aware when to return to the clinic for a follow-up visit. Patient educated on when it is appropriate to go to the emergency department.   Mary-Margaret Daphine Deutscher, FNP

## 2023-08-23 NOTE — Patient Instructions (Addendum)
1. Take meds as prescribed 2. Use a cool mist humidifier especially during the winter months and when heat has been humid. 3. Use saline nose sprays frequently 4. Saline irrigations of the nose can be very helpful if done frequently.  * 4X daily for 1 week*  * Use of a nettie pot can be helpful with this. Follow directions with this* 5. Drink plenty of fluids 6. Keep thermostat turn down low 7.For any cough or congestion- hycodan 8. For fever or aces or pains- take tylenol or ibuprofen appropriate for age and weight.  * for fevers greater than 101 orally you may alternate ibuprofen and tylenol every  3 hours.    

## 2023-09-01 ENCOUNTER — Other Ambulatory Visit: Payer: Self-pay | Admitting: Adult Health

## 2023-09-01 DIAGNOSIS — F410 Panic disorder [episodic paroxysmal anxiety] without agoraphobia: Secondary | ICD-10-CM

## 2023-09-01 DIAGNOSIS — F411 Generalized anxiety disorder: Secondary | ICD-10-CM

## 2023-10-08 ENCOUNTER — Encounter: Payer: Self-pay | Admitting: Cardiology

## 2023-10-08 ENCOUNTER — Ambulatory Visit: Payer: Medicare HMO | Attending: Cardiology | Admitting: Cardiology

## 2023-10-08 VITALS — BP 122/58 | HR 104 | Ht 64.0 in | Wt 114.6 lb

## 2023-10-08 DIAGNOSIS — R002 Palpitations: Secondary | ICD-10-CM

## 2023-10-08 DIAGNOSIS — I951 Orthostatic hypotension: Secondary | ICD-10-CM | POA: Diagnosis not present

## 2023-10-08 DIAGNOSIS — R0789 Other chest pain: Secondary | ICD-10-CM

## 2023-10-08 DIAGNOSIS — E782 Mixed hyperlipidemia: Secondary | ICD-10-CM | POA: Diagnosis not present

## 2023-10-08 DIAGNOSIS — Z79899 Other long term (current) drug therapy: Secondary | ICD-10-CM | POA: Diagnosis not present

## 2023-10-08 NOTE — Progress Notes (Signed)
Clinical Summary Alicia Sanchez is a 62 y.o.female seen today for follow up of the following medical problems.    1.Syncope - episode 1 week ago - was at home sitting at kitchen table. Just took medications. Stood up and started toward, felt dizzy. Fell to floor, unsure if true LOC. Bladder incontinence. Had phone in her hand, called daughter, family came over. Tried to get up and recurrent symptoms.  - chronic orthostatic symptoms - water 4-5 bottles per day. - when kids got her up, went to bed room and checked bp. 89/73 -orthostatics abnormal, DBP drops 18 points with standing.     - No recent syncope, no dizziness.  - has prn midodrine, has not needed - working to stay well hydrated.     2.Palpitations 06/2018 event monitor benign.  - 04/2023 monitor: rare PACs, 11 runs SVT longest 20 beats. Rare PVCs, isolated 6 beat run NSVT  - no recent symptoms - has prn lopressor, rarely needs      3. COPD - 07/2018 PFTs severe COPD - followed by pulmonary Dr Isaiah Serge   - has home O2 prn       5. Chest pain - pressure/tightness mid to left chest. Rest or at rest. Mild to moderate pain, lasts about 1 minute - +SOB, can feel anxious - limited exertion due to her chornic COPD.  - recent CT lung cance screen showed CAD    01/2020 coronary CTA: nonobstructive CAD -07/2021 lexiscan: mild ischemia 01/2023 nuclear stress: no ischemia  - some recent chest pain. Seen by pcp noted wheezing, better with abx, steroids.     6. Hyperlipidemia - 10/2021 TC 134 TG 138 HDL 62 LDL 48 - due for repeat labs Past Medical History:  Diagnosis Date   Anxiety    Aortic atherosclerosis (HCC)    Chronic kidney disease    COPD (chronic obstructive pulmonary disease) (HCC)    Depression    Headache    History of chest pain    History of syncope    Hypotension    Osteoporosis    Palpitations    Panic attacks    Pneumonia    Restless legs syndrome (RLS)      Allergies  Allergen Reactions    Doxycycline     Chest pain     Current Outpatient Medications  Medication Sig Dispense Refill   acetaminophen (TYLENOL) 500 MG tablet Take 1,000 mg by mouth every 6 (six) hours as needed for moderate pain.     albuterol (VENTOLIN HFA) 108 (90 Base) MCG/ACT inhaler INHALE 2 PUFFS INTO THE LUNGS EVERY 6 HOURS AS NEEDED FOR WHEEZING OR SHORTNESS OF BREATH. 51 each 3   alendronate (FOSAMAX) 70 MG tablet TAKE 1 TABLET EVERY 7 DAYS WITH A FULL GLASS OF WATER ON AN EMPTY STOMACH 12 tablet 3   ALPRAZolam (XANAX) 0.5 MG tablet TAKE 1 TABLET BY MOUTH THREE TIMES DAILY AND 1 AS NEEDED FOR INCREASE ANXIETY 120 tablet 1   aspirin EC 81 MG tablet Take 81 mg by mouth daily.     atorvastatin (LIPITOR) 40 MG tablet TAKE 1 TABLET EVERY DAY 90 tablet 3   azithromycin (ZITHROMAX Z-PAK) 250 MG tablet As directed 6 tablet 0   Budeson-Glycopyrrol-Formoterol (BREZTRI AEROSPHERE) 160-9-4.8 MCG/ACT AERO Inhale 2 puffs into the lungs 2 (two) times daily. 10.7 g 11   Calcium-Magnesium-Zinc (CAL-MAG-ZINC PO) Take 1 tablet by mouth daily.     cetirizine (EQ ALLERGY RELIEF, CETIRIZINE,) 10 MG tablet Take  1 tablet by mouth once daily 90 tablet 3   Cholecalciferol (VITAMIN D3 MAXIMUM STRENGTH) 125 MCG (5000 UT) capsule Take 5,000 Units by mouth daily.     DULoxetine (CYMBALTA) 60 MG capsule Take 2 capsules (120 mg total) by mouth daily. 180 capsule 3   HYDROcodone bit-homatropine (HYCODAN) 5-1.5 MG/5ML syrup Take 5 mLs by mouth every 6 (six) hours as needed for cough. 120 mL 0   ipratropium-albuterol (DUONEB) 0.5-2.5 (3) MG/3ML SOLN INHALE THE CONTENTS OF 1 VIAL VIA NEBULIZER EVERY 6 HOURS AS NEEDED (Patient taking differently: Take 3 mLs by nebulization every 4 (four) hours as needed (Shortness of breath).) 360 mL 11   meloxicam (MOBIC) 7.5 MG tablet Take 1 tablet (7.5 mg total) by mouth daily. 90 tablet 3   metoprolol tartrate (LOPRESSOR) 25 MG tablet Take 0.5 tablets (12.5 mg total) by mouth 2 (two) times daily as needed  (palpitations). 30 tablet 2   midodrine (PROAMATINE) 2.5 MG tablet Take 2.5 mg by mouth 3 (three) times daily as needed (bp below 90/60).     nitroGLYCERIN (NITROSTAT) 0.4 MG SL tablet Place 1 tablet (0.4 mg total) under the tongue every 5 (five) minutes x 3 doses as needed for chest pain (if o relief after 2nd dose, proceed to the ED for an evaluation or call 911). 25 tablet 2   omeprazole (PRILOSEC) 40 MG capsule Take 1 capsule (40 mg total) by mouth daily. 30 capsule 3   QUEtiapine (SEROQUEL) 200 MG tablet TAKE 1 TABLET BY MOUTH AT BEDTIME 30 tablet 2   Respiratory Therapy Supplies (NEBULIZER/TUBING/MOUTHPIECE) KIT 1 Units by Does not apply route 4 (four) times daily. GIVE face mask and tubing for nebulizer. J44.1 1 kit 0   rOPINIRole (REQUIP) 3 MG tablet Take 1 tablet (3 mg total) by mouth at bedtime. 90 tablet 3   Varenicline Tartrate, Starter, (CHANTIX STARTING MONTH PAK) 0.5 MG X 11 & 1 MG X 42 TBPK Take as instructed 53 each 0   No current facility-administered medications for this visit.     Past Surgical History:  Procedure Laterality Date   ABDOMINAL HYSTERECTOMY     BIOPSY  05/20/2023   Procedure: BIOPSY;  Surgeon: Sherrilyn Rist, MD;  Location: [facility];  Service: Gastroenterology;;   COLONOSCOPY WITH PROPOFOL N/A 05/20/2023   Procedure: COLONOSCOPY WITH PROPOFOL;  Surgeon: Sherrilyn Rist, MD;  Location: [facility];  Service: Gastroenterology;  Laterality: N/A;   ESOPHAGOGASTRODUODENOSCOPY (EGD) WITH PROPOFOL N/A 05/20/2023   Procedure: ESOPHAGOGASTRODUODENOSCOPY (EGD) WITH PROPOFOL;  Surgeon: Sherrilyn Rist, MD;  Location: [facility];  Service: Gastroenterology;  Laterality: N/A;     Allergies  Allergen Reactions   Doxycycline     Chest pain      Family History  Problem Relation Age of Onset   COPD Mother    COPD Father    Diabetes type II Sister    Kidney failure Sister    Diabetes type II Brother    COPD Maternal Grandfather    Breast cancer Neg  Hx    Liver disease Neg Hx    Esophageal cancer Neg Hx    Colon cancer Neg Hx      Social History Ms. Wilmeth reports that she has been smoking cigarettes. She has a 21 pack-year smoking history. She has never used smokeless tobacco. Ms. Chrzanowski reports current alcohol use.   Review of Systems CONSTITUTIONAL: No weight loss, fever, chills, weakness or fatigue.  HEENT: Eyes: No visual loss,  blurred vision, double vision or yellow sclerae.No hearing loss, sneezing, congestion, runny nose or sore throat.  SKIN: No rash or itching.  CARDIOVASCULAR: per hpi RESPIRATORY: per hpi GASTROINTESTINAL: No anorexia, nausea, vomiting or diarrhea. No abdominal pain or blood.  GENITOURINARY: No burning on urination, no polyuria NEUROLOGICAL: No headache, dizziness, syncope, paralysis, ataxia, numbness or tingling in the extremities. No change in bowel or bladder control.  MUSCULOSKELETAL: No muscle, back pain, joint pain or stiffness.  LYMPHATICS: No enlarged nodes. No history of splenectomy.  PSYCHIATRIC: No history of depression or anxiety.  ENDOCRINOLOGIC: No reports of sweating, cold or heat intolerance. No polyuria or polydipsia.  Marland Kitchen   Physical Examination Today's Vitals   10/08/23 1251  BP: (!) 122/58  Pulse: (!) 104  SpO2: 96%  Weight: 114 lb 9.6 oz (52 kg)  Height: 5\' 4"  (1.626 m)   Body mass index is 19.67 kg/m.  Gen: resting comfortably, no acute distress HEENT: no scleral icterus, pupils equal round and reactive, no palptable cervical adenopathy,  CV: RRR, no mrg, no jvd Resp: +bilateral wheezing.  GI: abdomen is soft, non-tender, non-distended, normal bowel sounds, no hepatosplenomegaly MSK: extremities are warm, no edema.  Skin: warm, no rash Neuro:  no focal deficits Psych: appropriate affect   Diagnostic Studies  Jan 2017 Study Conclusions   - Left ventricle: The cavity size was normal. Systolic function was   vigorous. The estimated ejection fraction was in the  range of 65%   to 70%. Wall motion was normal; there were no regional wall   motion abnormalities. Doppler parameters are consistent with   abnormal left ventricular relaxation (grade 1 diastolic   dysfunction). There was no evidence of elevated ventricular   filling pressure by Doppler parameters. - Aortic valve: There was no regurgitation. - Aortic root: The aortic root was normal in size. - Mitral valve: Structurally normal valve. There was mild   regurgitation. - Right ventricle: The cavity size was normal. Wall thickness was   normal. Systolic function was normal. - Right atrium: The atrium was normal in size. - Tricuspid valve: There was mild regurgitation. - Pulmonic valve: There was no regurgitation. - Pulmonary arteries: Systolic pressure was within the normal   range. - Inferior vena cava: The vessel was normal in size. - Pericardium, extracardiac: There was no pericardial effusion.     06/2018 echo Study Conclusions   - Left ventricle: The cavity size was normal. Wall thickness was   normal. Systolic function was normal. The estimated ejection   fraction was in the range of 60% to 65%. Wall motion was normal;   there were no regional wall motion abnormalities. Left   ventricular diastolic function parameters were normal. - Aortic valve: Mildly calcified annulus. Probably trileaflet. - Mitral valve: Mildly calcified annulus. There was trivial   regurgitation. - Right ventricle: The cavity size was mildly dilated. - Right atrium: Central venous pressure (est): 3 mm Hg. - Atrial septum: No defect or patent foramen ovale was identified. - Tricuspid valve: There was trivial regurgitation. - Pericardium, extracardiac: A prominent pericardial fat pad was   present.   06/2018 event monitor 7 day event monitor Min HR 76, Max HR 143, Avg HR 93 Symptoms correlate with sinus rhythm and sinus tachycardia No significant arrhythmias     01/2020 coronary CTA IMPRESSION: 1.  Coronary artery calcium score 45 Agatston units. This places the patient in the 85th percentile for age and gender, suggesting high risk for future cardiac events.  2.  Nonobstructive mild CAD noted.    01/2023 nuclear stress The study is normal. The study is low risk.   No ST deviation was noted. The ECG was negative for ischemia.   LV perfusion is normal.  No significant myocardial perfusion defects to indicate scar or ischemia.   Left ventricular function is normal. Nuclear stress EF: 85 %.   Low risk study with no significant myocardial perfusion defects to indicate scar or ischemia.  LVEF 85%.   02/2023 echo  1. Left ventricular ejection fraction, by estimation, is >75%. The left  ventricle has hyperdynamic function. The left ventricle has no regional  wall motion abnormalities. There is mild left ventricular hypertrophy.  Left ventricular diastolic parameters  are consistent with Grade I diastolic dysfunction (impaired relaxation).   2. Right ventricular systolic function is normal. The right ventricular  size is normal.   3. The mitral valve is normal in structure. No evidence of mitral valve  regurgitation. No evidence of mitral stenosis.   4. The aortic valve was not well visualized. Aortic valve regurgitation  is not visualized.   5. The inferior vena cava is normal in size with greater than 50%  respiratory variability, suggesting right atrial pressure of 3 mmHg.   02/2023 monitor 14 day monitor   Rare supraventricular ectopy in the form of isolated PACs, couplets, triplets. 11 runs of SVT longest 20 beats   Rare ventricular ectopy in the form of isolated PVCs, couplets. Isolated 6 beat run of NSVT   Several patient triggered events but no diary of symptoms included.   Assessment and Plan   1.Orthostatic syncope - history consistent with orthostatic syncope, prior clinic visit DBP dropped  18 points with standing - no recent symptoms, continue to monitor   2.  CHest pain/Coronary atherosclerosis - no evidence of significant obstructive disease over several stress tests and CTA - most recent symptoms resolved with abx and steroids likely symptoms were related to COPD - no additional cardiac testing at this time.    3. Palpitaitons - no recent symptoms, continue prn lopressor.    4. Hyperlipidemia - check lipid panel, continue atorvastatin.      Antoine Poche, M.D.

## 2023-10-08 NOTE — Patient Instructions (Signed)
Medication Instructions:  Continue all current medications.  Labwork: FLP - order given today Reminder:  Nothing to eat or drink after 12 midnight prior to labs. Office will contact with results via phone, letter or mychart.     Testing/Procedures: none  Follow-Up: 6 months   Any Other Special Instructions Will Be Listed Below (If Applicable).   If you need a refill on your cardiac medications before your next appointment, please call your pharmacy.

## 2023-10-09 ENCOUNTER — Other Ambulatory Visit: Payer: Medicare HMO

## 2023-10-09 DIAGNOSIS — E782 Mixed hyperlipidemia: Secondary | ICD-10-CM | POA: Diagnosis not present

## 2023-10-09 DIAGNOSIS — Z79899 Other long term (current) drug therapy: Secondary | ICD-10-CM | POA: Diagnosis not present

## 2023-10-10 LAB — LIPID PANEL
Chol/HDL Ratio: 1.8 ratio (ref 0.0–4.4)
Cholesterol, Total: 137 mg/dL (ref 100–199)
HDL: 77 mg/dL (ref >39)
LDL Chol Calc (NIH): 46 mg/dL (ref 0–99)
Triglycerides: 73 mg/dL (ref 0–149)
VLDL Cholesterol Cal: 14 mg/dL (ref 5–40)

## 2023-10-21 ENCOUNTER — Encounter: Payer: Self-pay | Admitting: Family Medicine

## 2023-10-21 ENCOUNTER — Ambulatory Visit: Payer: Medicare HMO | Admitting: Family Medicine

## 2023-10-21 ENCOUNTER — Ambulatory Visit (INDEPENDENT_AMBULATORY_CARE_PROVIDER_SITE_OTHER): Payer: Medicare HMO

## 2023-10-21 VITALS — BP 120/71 | HR 100 | Temp 96.3°F | Ht 64.0 in | Wt 116.0 lb

## 2023-10-21 DIAGNOSIS — J441 Chronic obstructive pulmonary disease with (acute) exacerbation: Secondary | ICD-10-CM | POA: Diagnosis not present

## 2023-10-21 DIAGNOSIS — R059 Cough, unspecified: Secondary | ICD-10-CM | POA: Diagnosis not present

## 2023-10-21 DIAGNOSIS — R350 Frequency of micturition: Secondary | ICD-10-CM | POA: Diagnosis not present

## 2023-10-21 DIAGNOSIS — J3489 Other specified disorders of nose and nasal sinuses: Secondary | ICD-10-CM | POA: Diagnosis not present

## 2023-10-21 DIAGNOSIS — R918 Other nonspecific abnormal finding of lung field: Secondary | ICD-10-CM | POA: Diagnosis not present

## 2023-10-21 MED ORDER — FEXOFENADINE HCL 180 MG PO TABS
180.0000 mg | ORAL_TABLET | Freq: Every day | ORAL | 0 refills | Status: DC
Start: 2023-10-21 — End: 2023-12-23

## 2023-10-21 MED ORDER — FLUCONAZOLE 150 MG PO TABS: 150.0000 mg | ORAL_TABLET | Freq: Once | ORAL | 0 refills | Status: AC

## 2023-10-21 MED ORDER — PREDNISONE 20 MG PO TABS
40.0000 mg | ORAL_TABLET | Freq: Every day | ORAL | 0 refills | Status: AC
Start: 2023-10-21 — End: 2023-10-24

## 2023-10-21 MED ORDER — CEFDINIR 300 MG PO CAPS: 300.0000 mg | ORAL_CAPSULE | Freq: Two times a day (BID) | ORAL | 0 refills | Status: DC

## 2023-10-21 MED ORDER — PROMETHAZINE-DM 6.25-15 MG/5ML PO SYRP: 2.5000 mL | ORAL_SOLUTION | Freq: Four times a day (QID) | ORAL | 0 refills | Status: DC | PRN

## 2023-10-21 MED ORDER — METHYLPREDNISOLONE ACETATE 40 MG/ML IJ SUSP
40.0000 mg | Freq: Once | INTRAMUSCULAR | Status: AC
Start: 2023-10-21 — End: 2023-10-21
  Administered 2023-10-21: 40 mg via INTRAMUSCULAR

## 2023-10-21 NOTE — Progress Notes (Addendum)
Subjective: CC: Cough PCP: [name], DO NWG:NFAOZH Helin is a 62 y.o. female presenting to clinic today for:  1.  Cough Patient reports that she has had a productive cough with yellow-brown sputum for the last 2 weeks it has been getting worse.  She denies any measured fevers.  She felt pretty bad over the holidays.  She has been utilizing her albuterol with last use yesterday.  She also has nebulizers and Breztri at home.   ROS: Per HPI  Allergies  Allergen Reactions   Doxycycline     Chest pain   Past Medical History:  Diagnosis Date   Anxiety    Aortic atherosclerosis (HCC)    Chronic kidney disease    COPD (chronic obstructive pulmonary disease) (HCC)    Depression    Headache    History of chest pain    History of syncope    Hypotension    Osteoporosis    Palpitations    Panic attacks    Pneumonia    Restless legs syndrome (RLS)     Current Outpatient Medications:    acetaminophen (TYLENOL) 500 MG tablet, Take 1,000 mg by mouth every 6 (six) hours as needed for moderate pain., Disp: , Rfl:    albuterol (VENTOLIN HFA) 108 (90 Base) MCG/ACT inhaler, INHALE 2 PUFFS INTO THE LUNGS EVERY 6 HOURS AS NEEDED FOR WHEEZING OR SHORTNESS OF BREATH., Disp: 51 each, Rfl: 3   alendronate (FOSAMAX) 70 MG tablet, TAKE 1 TABLET EVERY 7 DAYS WITH A FULL GLASS OF WATER ON AN EMPTY STOMACH, Disp: 12 tablet, Rfl: 3   ALPRAZolam (XANAX) 0.5 MG tablet, TAKE 1 TABLET BY MOUTH THREE TIMES DAILY AND 1 AS NEEDED FOR INCREASE ANXIETY, Disp: 120 tablet, Rfl: 1   aspirin EC 81 MG tablet, Take 81 mg by mouth daily., Disp: , Rfl:    atorvastatin (LIPITOR) 40 MG tablet, TAKE 1 TABLET EVERY DAY, Disp: 90 tablet, Rfl: 3   Budeson-Glycopyrrol-Formoterol (BREZTRI AEROSPHERE) 160-9-4.8 MCG/ACT AERO, Inhale 2 puffs into the lungs 2 (two) times daily., Disp: 10.7 g, Rfl: 11   Calcium-Magnesium-Zinc (CAL-MAG-ZINC PO), Take 1 tablet by mouth daily., Disp: , Rfl:    cetirizine (EQ ALLERGY  RELIEF, CETIRIZINE,) 10 MG tablet, Take 1 tablet by mouth once daily, Disp: 90 tablet, Rfl: 3   Cholecalciferol (VITAMIN D3 MAXIMUM STRENGTH) 125 MCG (5000 UT) capsule, Take 5,000 Units by mouth daily., Disp: , Rfl:    DULoxetine (CYMBALTA) 60 MG capsule, Take 2 capsules (120 mg total) by mouth daily., Disp: 180 capsule, Rfl: 3   HYDROcodone bit-homatropine (HYCODAN) 5-1.5 MG/5ML syrup, Take 5 mLs by mouth every 6 (six) hours as needed for cough. (Patient not taking: Reported on 10/08/2023), Disp: 120 mL, Rfl: 0   ipratropium-albuterol (DUONEB) 0.5-2.5 (3) MG/3ML SOLN, INHALE THE CONTENTS OF 1 VIAL VIA NEBULIZER EVERY 6 HOURS AS NEEDED (Patient taking differently: Take 3 mLs by nebulization every 4 (four) hours as needed (Shortness of breath).), Disp: 360 mL, Rfl: 11   meloxicam (MOBIC) 7.5 MG tablet, Take 1 tablet (7.5 mg total) by mouth daily. (Patient not taking: Reported on 10/08/2023), Disp: 90 tablet, Rfl: 3   metoprolol tartrate (LOPRESSOR) 25 MG tablet, Take 0.5 tablets (12.5 mg total) by mouth 2 (two) times daily as needed (palpitations)., Disp: 30 tablet, Rfl: 2   midodrine (PROAMATINE) 2.5 MG tablet, Take 2.5 mg by mouth 3 (three) times daily as needed (bp below 90/60)., Disp: , Rfl:    nitroGLYCERIN (NITROSTAT) 0.4 MG  SL tablet, Place 1 tablet (0.4 mg total) under the tongue every 5 (five) minutes x 3 doses as needed for chest pain (if o relief after 2nd dose, proceed to the ED for an evaluation or call 911)., Disp: 25 tablet, Rfl: 2   omeprazole (PRILOSEC) 40 MG capsule, Take 1 capsule (40 mg total) by mouth daily., Disp: 30 capsule, Rfl: 3   QUEtiapine (SEROQUEL) 200 MG tablet, TAKE 1 TABLET BY MOUTH AT BEDTIME, Disp: 30 tablet, Rfl: 2   Respiratory Therapy Supplies (NEBULIZER/TUBING/MOUTHPIECE) KIT, 1 Units by Does not apply route 4 (four) times daily. GIVE face mask and tubing for nebulizer. J44.1, Disp: 1 kit, Rfl: 0   rOPINIRole (REQUIP) 3 MG tablet, Take 1 tablet (3 mg total) by mouth  at bedtime., Disp: 90 tablet, Rfl: 3 Social History   Socioeconomic History   Marital status: Married    Spouse name: Not on file   Number of children: 2   Years of education: Not on file   Highest education level: Not on file  Occupational History   Not on file  Tobacco Use   Smoking status: Every Day    Current packs/day: 0.50    Average packs/day: 0.5 packs/day for 42.0 years (21.0 ttl pk-yrs)    Types: Cigarettes   Smokeless tobacco: Never   Tobacco comments:    1/2 pack daily  Vaping Use   Vaping status: Never Used  Substance and Sexual Activity   Alcohol use: Yes    Comment: soical   Drug use: Yes    Frequency: 1.0 times per week    Types: Marijuana   Sexual activity: Yes    Birth control/protection: Surgical  Other Topics Concern   Not on file  Social History Narrative   Not on file   Social Determinants of Health   Financial Resource Strain: Low Risk  (12/20/2022)   Overall Financial Resource Strain (CARDIA)    Difficulty of Paying Living Expenses: Not hard at all  Food Insecurity: No Food Insecurity (03/18/2023)   Hunger Vital Sign    Worried About Running Out of Food in the Last Year: Never true    Ran Out of Food in the Last Year: Never true  Transportation Needs: No Transportation Needs (03/18/2023)   PRAPARE - Administrator, Civil Service (Medical): No    Lack of Transportation (Non-Medical): No  Physical Activity: Inactive (12/20/2022)   Exercise Vital Sign    Days of Exercise per Week: 0 days    Minutes of Exercise per Session: 0 min  Stress: No Stress Concern Present (12/20/2022)   [employer] of Occupational Health - Occupational Stress Questionnaire    Feeling of Stress : Not at all  Social Connections: Moderately Isolated (12/20/2022)   Social Connection and Isolation Panel [NHANES]    Frequency of Communication with Friends and Family: More than three times a week    Frequency of Social Gatherings with Friends and Family: More than  three times a week    Attends Religious Services: More than 4 times per year    Active Member of [employer] or Organizations: No    Attends Banker Meetings: Never    Marital Status: Separated  Intimate Partner Violence: Not At Risk (03/12/2023)   Humiliation, Afraid, Rape, and Kick questionnaire    Fear of Current or Ex-Partner: No    Emotionally Abused: No    Physically Abused: No    Sexually Abused: No   Family History  Problem  Relation Age of Onset   COPD Mother    COPD Father    Diabetes type II Sister    Kidney failure Sister    Diabetes type II Brother    COPD Maternal Grandfather    Breast cancer Neg Hx    Liver disease Neg Hx    Esophageal cancer Neg Hx    Colon cancer Neg Hx     Objective: Office vital signs reviewed. BP 120/71   Pulse 100   Temp (!) 96.3 F (35.7 C)   Ht 5\' 4"  (1.626 m)   Wt 116 lb (52.6 kg)   SpO2 95%   BMI 19.91 kg/m   Physical Examination:  General: Awake, alert, nontoxic female, No acute distress HEENT: Exotropia of the right eye.  Sclera white.  Moist mucous membranes.  No gross drainage or facial swelling Cardio: regular rate and rhythm, S1S2 heard, no murmurs appreciated Pulm: Globally decreased breath sounds with expiratory wheezes noted at the bases    Assessment/ Plan: 62 y.o. female   COPD exacerbation (HCC) - Plan: methylPREDNISolone acetate (DEPO-MEDROL) injection 40 mg, DG Chest 2 View, predniSONE (DELTASONE) 20 MG tablet, cefdinir (OMNICEF) 300 MG capsule, fluconazole (DIFLUCAN) 150 MG tablet, promethazine-dextromethorphan (PROMETHAZINE-DM) 6.25-15 MG/5ML syrup  Urinary frequency  Sinus drainage - Plan: fexofenadine (ALLEGRA ALLERGY) 180 MG tablet  Plan consistent with COPD exacerbation.  DuoNeb administered.  Depo-Medrol administered.  Start prednisone tomorrow.  Start [employer].  Diflucan given for as needed use.  I personally reviewed her CXR and saw no pulmonary infiltrates.  Awaiting review by  radiology  Allegra sample provided  Urinary frequency resolved after UTI resolution.   [name], DO [facility] [phone]

## 2023-10-21 NOTE — Patient Instructions (Signed)

## 2023-10-22 DIAGNOSIS — J3489 Other specified disorders of nose and nasal sinuses: Secondary | ICD-10-CM | POA: Diagnosis not present

## 2023-10-22 DIAGNOSIS — R350 Frequency of micturition: Secondary | ICD-10-CM | POA: Diagnosis not present

## 2023-10-22 DIAGNOSIS — J441 Chronic obstructive pulmonary disease with (acute) exacerbation: Secondary | ICD-10-CM | POA: Diagnosis not present

## 2023-10-22 MED ORDER — ALBUTEROL SULFATE (2.5 MG/3ML) 0.083% IN NEBU
2.5000 mg | INHALATION_SOLUTION | Freq: Once | RESPIRATORY_TRACT | Status: AC
Start: 2023-10-22 — End: 2023-10-22
  Administered 2023-10-22: 2.5 mg via RESPIRATORY_TRACT

## 2023-10-22 MED ORDER — IPRATROPIUM-ALBUTEROL 0.5-2.5 (3) MG/3ML IN SOLN
3.0000 mL | Freq: Once | RESPIRATORY_TRACT | Status: AC
Start: 2023-10-22 — End: 2023-10-22
  Administered 2023-10-22: 3 mL via RESPIRATORY_TRACT

## 2023-10-22 NOTE — Addendum Note (Signed)
Addended by: Waynette Buttery on: 10/22/2023 04:55 PM   Modules accepted: Orders

## 2023-10-25 ENCOUNTER — Encounter: Payer: Self-pay | Admitting: *Deleted

## 2023-10-27 ENCOUNTER — Other Ambulatory Visit: Payer: Self-pay | Admitting: Adult Health

## 2023-10-27 DIAGNOSIS — F411 Generalized anxiety disorder: Secondary | ICD-10-CM

## 2023-10-27 DIAGNOSIS — F410 Panic disorder [episodic paroxysmal anxiety] without agoraphobia: Secondary | ICD-10-CM

## 2023-10-27 NOTE — Telephone Encounter (Signed)
Needs appt, sent MyChart message.

## 2023-10-29 NOTE — Telephone Encounter (Signed)
Please call to schedule FU, is due now.

## 2023-10-31 ENCOUNTER — Telehealth: Payer: Self-pay | Admitting: Pharmacist

## 2023-10-31 DIAGNOSIS — J432 Centrilobular emphysema: Secondary | ICD-10-CM

## 2023-10-31 MED ORDER — BREZTRI AEROSPHERE 160-9-4.8 MCG/ACT IN AERO
2.0000 | INHALATION_SPRAY | Freq: Two times a day (BID) | RESPIRATORY_TRACT | 5 refills | Status: DC
Start: 2023-10-31 — End: 2023-12-31

## 2023-10-31 NOTE — Telephone Encounter (Signed)
   Patient [place] for [facility].  Updated RX escribed to [facility] mail order (pharmacy for [place] patient assistance).  Patient is stable on current regimen.  She will need to re-enroll for 2025. Please route me PAP if needed  Kieth Brightly, PharmD, BCACP, CPP Clinical Pharmacist, [facility]

## 2023-11-04 ENCOUNTER — Other Ambulatory Visit: Payer: Self-pay

## 2023-11-04 MED ORDER — OMEPRAZOLE 40 MG PO CPDR: 40.0000 mg | DELAYED_RELEASE_CAPSULE | Freq: Every day | ORAL | 3 refills | Status: DC

## 2023-11-06 ENCOUNTER — Other Ambulatory Visit: Payer: Self-pay | Admitting: Family Medicine

## 2023-11-06 ENCOUNTER — Ambulatory Visit: Payer: Medicare HMO | Admitting: Adult Health

## 2023-11-06 ENCOUNTER — Encounter: Payer: Self-pay | Admitting: Adult Health

## 2023-11-06 DIAGNOSIS — F4310 Post-traumatic stress disorder, unspecified: Secondary | ICD-10-CM

## 2023-11-06 DIAGNOSIS — F411 Generalized anxiety disorder: Secondary | ICD-10-CM

## 2023-11-06 DIAGNOSIS — F331 Major depressive disorder, recurrent, moderate: Secondary | ICD-10-CM

## 2023-11-06 DIAGNOSIS — G4700 Insomnia, unspecified: Secondary | ICD-10-CM | POA: Diagnosis not present

## 2023-11-06 DIAGNOSIS — F410 Panic disorder [episodic paroxysmal anxiety] without agoraphobia: Secondary | ICD-10-CM | POA: Diagnosis not present

## 2023-11-06 NOTE — Progress Notes (Signed)
Alicia Sanchez [id number] 1961-05-23 62 y.o.  Subjective:   Patient ID:  Alicia Sanchez is a 62 y.o. (DOB 12-18-1960) female.  Chief Complaint: No chief complaint on file.   HPI Alicia Sanchez presents to the office today for follow-up of GAD, MDD, PTSD, insomnia and panic attacks.  Describes mood today as "ok". Pleasant. Tearful. Mood symptoms - reports decreased depression - "not like I used too". Reports anxiety at times. Reports irritability - "fidgety at times". Reports decreased panic attacks - "a couple a week". Mood is more consistent. Stating "I'm not feeling too good - physical symptoms". Spending time with family. Stable interest and motivation. Taking medication prescribed.                                                                                  Energy levels lower. Active, does not have a regular exercise routine.   Enjoys some usual interests and activities. Married, but legally separated. Has two daughters and 2 step daughters. Has 3 sisters and 2 brothers. Mother living - talks to her. Spending time with family. Involved in church. Appetite adequate. Weight loss - 106 from 113 pounds. Sleeping better at night. Averages 8 or more hours. Focus and concentration stable. Completing tasks. Managing aspects of household. Disabled since 2020.  Denies SI or HI.  Denies AH or VH. Denies self harm. Denies substance use.  Previous medication trials: Paxil, Wellbutrin, Depakote, Trazadone and others   GAD-7    Flowsheet Row Office Visit from 10/21/2023 in [facility] Office Visit from 08/23/2023 in [facility] Office Visit from 07/16/2023 in [facility] Office Visit from 03/25/2023 in [facility] Office Visit from 02/04/2023 in [facility]  Total GAD-7 Score 2 10 9 7 13       Mini-Mental    Flowsheet Row  Office Visit from 10/25/2021 in [facility]  Total Score (max 30 points ) 30      PHQ2-9    Flowsheet Row Office Visit from 10/21/2023 in [facility] Office Visit from 08/23/2023 in [facility] Office Visit from 07/16/2023 in [facility] Office Visit from 03/25/2023 in [facility] Office Visit from 02/04/2023 in [facility]  PHQ-2 Total Score 2 3 2 2  0  PHQ-9 Total Score 10 15 11 9 6       Flowsheet Row ED to Hosp-Admission (Discharged) from 03/11/2023 in [facility] ED from 08/08/2021 in [facility] Department at [facility]  C-SSRS RISK CATEGORY No Risk No Risk        Review of Systems:  Review of Systems  Musculoskeletal:  Negative for gait problem.  Neurological:  Negative for tremors.  Psychiatric/Behavioral:         Please refer to HPI    Medications: I have reviewed the patient's current medications.  Current Outpatient Medications  Medication Sig Dispense Refill   acetaminophen (TYLENOL) 500 MG tablet Take 1,000 mg by mouth every 6 (  six) hours as needed for moderate pain.     albuterol (VENTOLIN HFA) 108 (90 Base) MCG/ACT inhaler INHALE 2 PUFFS INTO THE LUNGS EVERY 6 HOURS AS NEEDED FOR WHEEZING OR SHORTNESS OF BREATH. 51 each 3   alendronate (FOSAMAX) 70 MG tablet TAKE 1 TABLET EVERY 7 DAYS WITH A FULL GLASS OF WATER ON AN EMPTY STOMACH 12 tablet 3   ALPRAZolam (XANAX) 0.5 MG tablet TAKE 1 TABLET BY MOUTH THREE TIMES DAILY AND 1 AS NEEDED FOR INCREASE ANXIETY 120 tablet 1   aspirin EC 81 MG tablet Take 81 mg by mouth daily.     atorvastatin (LIPITOR) 40 MG tablet TAKE 1 TABLET EVERY DAY 90 tablet 3   Budeson-Glycopyrrol-Formoterol (BREZTRI AEROSPHERE) 160-9-4.8 MCG/ACT AERO Inhale 2 puffs into the lungs 2 (two) times daily. 32.1 g 5    Calcium-Magnesium-Zinc (CAL-MAG-ZINC PO) Take 1 tablet by mouth daily.     cefdinir (OMNICEF) 300 MG capsule Take 1 capsule (300 mg total) by mouth 2 (two) times daily. 1 po BID 20 capsule 0   Cholecalciferol (VITAMIN D3 MAXIMUM STRENGTH) 125 MCG (5000 UT) capsule Take 5,000 Units by mouth daily.     DULoxetine (CYMBALTA) 60 MG capsule Take 2 capsules (120 mg total) by mouth daily. 180 capsule 3   fexofenadine (ALLEGRA ALLERGY) 180 MG tablet Take 1 tablet (180 mg total) by mouth daily. 5 tablet 0   ipratropium-albuterol (DUONEB) 0.5-2.5 (3) MG/3ML SOLN INHALE THE CONTENTS OF 1 VIAL VIA NEBULIZER EVERY 6 HOURS AS NEEDED (Patient taking differently: Take 3 mLs by nebulization every 4 (four) hours as needed (Shortness of breath).) 360 mL 11   meloxicam (MOBIC) 7.5 MG tablet Take 1 tablet (7.5 mg total) by mouth daily. 90 tablet 3   metoprolol tartrate (LOPRESSOR) 25 MG tablet Take 0.5 tablets (12.5 mg total) by mouth 2 (two) times daily as needed (palpitations). 30 tablet 2   midodrine (PROAMATINE) 2.5 MG tablet Take 2.5 mg by mouth 3 (three) times daily as needed (bp below 90/60).     nitroGLYCERIN (NITROSTAT) 0.4 MG SL tablet Place 1 tablet (0.4 mg total) under the tongue every 5 (five) minutes x 3 doses as needed for chest pain (if o relief after 2nd dose, proceed to the ED for an evaluation or call 911). 25 tablet 2   omeprazole (PRILOSEC) 40 MG capsule Take 1 capsule (40 mg total) by mouth daily. 30 capsule 3   promethazine-dextromethorphan (PROMETHAZINE-DM) 6.25-15 MG/5ML syrup Take 2.5 mLs by mouth 4 (four) times daily as needed. 118 mL 0   QUEtiapine (SEROQUEL) 200 MG tablet TAKE 1 TABLET BY MOUTH AT BEDTIME 30 tablet 2   Respiratory Therapy Supplies (NEBULIZER/TUBING/MOUTHPIECE) KIT 1 Units by Does not apply route 4 (four) times daily. GIVE face mask and tubing for nebulizer. J44.1 1 kit 0   rOPINIRole (REQUIP) 3 MG tablet Take 1 tablet (3 mg total) by mouth at bedtime. 90 tablet 3   No current  facility-administered medications for this visit.    Medication Side Effects: None  Allergies:  Allergies  Allergen Reactions   Doxycycline     Chest pain    Past Medical History:  Diagnosis Date   Anxiety    Aortic atherosclerosis (HCC)    Chronic kidney disease    COPD (chronic obstructive pulmonary disease) (HCC)    Depression    Headache    History of chest pain    History of syncope    Hypotension    Osteoporosis  Palpitations    Panic attacks    Pneumonia    Restless legs syndrome (RLS)     Past Medical History, Surgical history, Social history, and Family history were reviewed and updated as appropriate.   Please see review of systems for further details on the patient's review from today.   Objective:   Physical Exam:  There were no vitals taken for this visit.  Physical Exam Constitutional:      General: She is not in acute distress. Musculoskeletal:        General: No deformity.  Neurological:     Mental Status: She is alert and oriented to person, place, and time.     Coordination: Coordination normal.  Psychiatric:        Attention and Perception: Attention and perception normal. She does not perceive auditory or visual hallucinations.        Mood and Affect: Mood normal. Mood is not anxious or depressed. Affect is not labile, blunt, angry or inappropriate.        Speech: Speech normal.        Behavior: Behavior normal.        Thought Content: Thought content normal. Thought content is not paranoid or delusional. Thought content does not include homicidal or suicidal ideation. Thought content does not include homicidal or suicidal plan.        Cognition and Memory: Cognition and memory normal.        Judgment: Judgment normal.     Comments: Insight intact     Lab Review:     Component Value Date/Time   NA 142 03/25/2023 1144   K 4.6 03/25/2023 1144   CL 102 03/25/2023 1144   CO2 23 03/25/2023 1144   GLUCOSE 67 (L) 03/25/2023 1144    GLUCOSE 110 (H) 03/13/2023 0351   BUN 12 03/25/2023 1144   CREATININE 0.87 03/25/2023 1144   CALCIUM 9.1 03/25/2023 1144   PROT 6.6 03/12/2023 0330   PROT 6.9 02/04/2023 1456   ALBUMIN 3.4 (L) 03/12/2023 0330   ALBUMIN 4.6 02/04/2023 1456   AST 25 03/12/2023 0330   ALT 15 03/12/2023 0330   ALKPHOS 57 03/12/2023 0330   BILITOT 0.6 03/12/2023 0330   BILITOT 0.7 02/04/2023 1456   GFRNONAA >60 03/13/2023 0351   GFRAA 91 06/03/2020 1505       Component Value Date/Time   WBC 9.0 03/25/2023 1144   WBC 9.6 03/14/2023 0518   RBC 4.94 03/25/2023 1144   RBC 4.31 03/14/2023 0518   HGB 15.3 03/25/2023 1144   HCT 45.1 03/25/2023 1144   PLT 374 03/25/2023 1144   MCV 91 03/25/2023 1144   MCH 31.0 03/25/2023 1144   MCH 30.9 03/14/2023 0518   MCHC 33.9 03/25/2023 1144   MCHC 33.4 03/14/2023 0518   RDW 12.8 03/25/2023 1144   LYMPHSABS 2.8 02/04/2023 1456   MONOABS 0.5 05/25/2018 0602   EOSABS 0.0 02/04/2023 1456   BASOSABS 0.1 02/04/2023 1456    No results found for: "POCLITH", "LITHIUM"   No results found for: "PHENYTOIN", "PHENOBARB", "VALPROATE", "CBMZ"   .res Assessment: Plan:    Plan:  PDMP reviewed  Reduce Xanax 0.5mg  - 4 times daily to 2 daily.  Seroquel 200mg  at hs  Referred to PCP for evaluation of cough and respiratory issues.   PCP: Cymbalta 120mg  every morning  Refer to therapist  RTC 3 months  Patient advised to contact office with any questions, adverse effects, or acute worsening in signs and symptoms.  Discussed  potential benefits, risk, and side effects of benzodiazepines to include potential risk of tolerance and dependence, as well as possible drowsiness. Advised patient not to drive if experiencing drowsiness and to take lowest possible effective dose to minimize risk of dependence and tolerance.  There are no diagnoses linked to this encounter.   Please see After Visit Summary for patient specific instructions.  Future Appointments  Date Time  Provider Department Center  12/23/2023 10:40 AM [facility]-ANNUAL WELLNESS VISIT [facility] None  01/17/2024 11:00 AM Delynn Flavin M, DO [facility] None    No orders of the defined types were placed in this encounter.   -------------------------------

## 2023-11-07 ENCOUNTER — Telehealth: Payer: Self-pay | Admitting: Adult Health

## 2023-11-07 ENCOUNTER — Other Ambulatory Visit: Payer: Self-pay

## 2023-11-07 DIAGNOSIS — F410 Panic disorder [episodic paroxysmal anxiety] without agoraphobia: Secondary | ICD-10-CM

## 2023-11-07 DIAGNOSIS — F411 Generalized anxiety disorder: Secondary | ICD-10-CM

## 2023-11-07 MED ORDER — QUETIAPINE FUMARATE 200 MG PO TABS: 200.0000 mg | ORAL_TABLET | Freq: Every day | ORAL | 2 refills | Status: DC

## 2023-11-07 MED ORDER — ALPRAZOLAM 0.5 MG PO TABS
ORAL_TABLET | ORAL | 1 refills | Status: DC
Start: 2023-11-07 — End: 2023-12-31

## 2023-11-07 NOTE — Telephone Encounter (Signed)
CALLED PT TO CONFIRM THE PHARMACY. LVM

## 2023-11-07 NOTE — Telephone Encounter (Signed)
Pt sent a message that her medicines were not sent in yesterday

## 2023-11-08 ENCOUNTER — Ambulatory Visit (INDEPENDENT_AMBULATORY_CARE_PROVIDER_SITE_OTHER): Payer: Medicare HMO | Admitting: Nurse Practitioner

## 2023-11-08 ENCOUNTER — Encounter: Payer: Self-pay | Admitting: Nurse Practitioner

## 2023-11-08 VITALS — BP 90/65 | HR 110 | Temp 97.5°F | Ht 64.0 in | Wt 110.0 lb

## 2023-11-08 DIAGNOSIS — J441 Chronic obstructive pulmonary disease with (acute) exacerbation: Secondary | ICD-10-CM

## 2023-11-08 MED ORDER — AZITHROMYCIN 250 MG PO TABS: ORAL_TABLET | ORAL | 0 refills | Status: DC

## 2023-11-08 MED ORDER — PREDNISONE 10 MG (21) PO TBPK: ORAL_TABLET | ORAL | 0 refills | Status: DC

## 2023-11-08 MED ORDER — HYDROCODONE BIT-HOMATROP MBR 5-1.5 MG/5ML PO SOLN: 5.0000 mL | Freq: Three times a day (TID) | ORAL | 0 refills | Status: DC | PRN

## 2023-11-08 NOTE — Patient Instructions (Signed)
Chronic Bronchitis, Adult  Chronic bronchitis is inflammation inside of the main airways (bronchi) that come off the windpipe (trachea) in the lungs. The swelling causes the airways to narrow and make more mucus than normal. This can make it hard to breathe and may cause coughing or noisy breathing (wheezing). This condition is a type of chronic obstructive pulmonary disease (COPD). Chronic bronchitis is often associated with other chronic respiratory conditions, such as emphysema, asthma, bronchiectasis, or cystic fibrosis. Chronic bronchitis is a long-term (chronic) condition. It is defined as a chronic cough with mucus (sputum) production: For at least 3 months of the year. For 2 years in a row. People with chronic bronchitis are more likely to get colds and other infections in the nose, throat, or airways. What are the causes? This condition is most often caused by: A history of smoking. Exposure to secondhand smoke or a smoky area for a long period of time. Frequent lung infections. Long-term exposure to certain fumes or chemicals that irritate the lungs. What are the signs or symptoms? Symptoms of chronic bronchitis may include: A cough that brings up mucus (productive cough). A whistling sound when you breathe (wheezing). Shortness of breath. Chest tightness or soreness. Fever or chills. Colds or respiratory infections that go away and return. How is this diagnosed? Your health care provider may diagnose this condition based on your signs and symptoms, especially if you have a cough that lasts a long time or keeps coming back. This condition may be diagnosed based on: Your symptoms and medical history. A physical exam, including listening to your lungs. Tests, such as: Testing a sputum sample. Blood tests. A chest X-ray. Tests of lung (pulmonary) function. How is this treated? There is no cure for chronic bronchitis. Treatment may help control your symptoms. This  includes: Drinking fluids. This may help thin your mucus so it is easier to cough up. Mucus-clearing techniques. Your health care provider will show you which techniques are best for you. Medicines such as: Inhaled medicine (inhaler) to improve air flow in and out of your lungs. Antibiotics to treat or prevent bacterial lung infections. Mucus-thinning medicines. Pulmonary rehabilitation. This is a program that helps you learn how to manage your breathing problem. The program may include exercise, education, counseling, treatment, and support. Using oxygen therapy, if your blood oxygen level is very low. Follow these instructions at home: Medicines Take over-the-counter and prescription medicines only as told by your health care provider. If you were prescribed an antibiotic medicine, take it as told by your health care provider. Do not stop taking the antibiotic even if you start to feel better. Lifestyle  Do not use any products that contain nicotine or tobacco. These products include cigarettes, chewing tobacco, and vaping devices, such as e-cigarettes. If you need help quitting, ask your health care provider. Stay away from other people's smoke (secondhand smoke) and any irritants that make you cough more, such as chemical fumes. Eat a healthy diet and get regular exercise. Talk with your health care provider about what activities are safe for you. Return to normal activities as told by your health care provider. Ask your health care provider what activities are safe for you. Preventing infections Stay up to date on all immunizations, including the pneumonia and flu vaccines. Wash your hands often with soap and water for at least 20 seconds. If soap and water are not available, use hand sanitizer. Avoid contact with people who have symptoms of a cold or the flu. Keep  your environment free from any known allergens such as dust, mold, pets, and pollen. General instructions Get plenty of  rest. Drink enough fluids to keep your urine pale yellow. Use oxygen therapy at home as directed. Follow instructions from your health care provider about how to use oxygen safely and take steps to prevent fire. Do not smoke while using oxygen or allow others to smoke in your home. Keep all follow-up visits. This is important. Contact a health care provider if: Your shortness of breath or coughing gets worse even when you take medicine. Your mucus gets thicker or changes color. You are not able to cough up your mucus. You have a fever. Get help right away if: You cough up blood. You have trouble breathing. You have chest pain. You feel dizzy or confused. These symptoms may represent a serious problem that is an emergency. Do not wait to see if the symptoms will go away. Get medical help right away. Call your local emergency services (911 in the [place] Do not drive yourself to the hospital. Summary Chronic bronchitis is inflammation inside of the main airways (bronchi) that come off the windpipe (trachea) in the lungs. The swelling causes the airways to narrow and make more mucus than normal. Chronic bronchitis is a long-term (chronic) condition. It is defined as a chronic cough with mucus (sputum) production for at least 3 months of the year for 2 years in a row. If you were prescribed an antibiotic medicine, take it as told by your health care provider. Do not stop taking the antibiotic even if you start to feel better. Do not use any products that contain nicotine or tobacco. These products include cigarettes, chewing tobacco, and vaping devices, such as e-cigarettes. If you need help quitting, ask your health care provider. This information is not intended to replace advice given to you by your health care provider. Make sure you discuss any questions you have with your health care provider. Document Revised: 03/08/2021 Document Reviewed: 03/08/2021 Elsevier Patient Education  2024  [employer].

## 2023-11-08 NOTE — Progress Notes (Signed)
Subjective:    Patient ID: Sarita Zamot, female    DOB: [birth year], 62 y.o.   MRN: [id number]   Chief Complaint: Shortness of Breath and Cough (Productive/)   Shortness of Breath Pertinent negatives include no fever.  Cough Associated symptoms include chills and shortness of breath. Pertinent negatives include no fever.    Patient was seen on 10/21/23 and was dx with COPD exacerbation. She was given steroid shot, omnicef an promethazine. She is still coughing and feels SOB.  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   Other spondylosis with radiculopathy, cervical region 12/21/2018   Chronic left shoulder pain 11/24/2018   Arthritis 11/24/2018   Stress incontinence of urine 08/12/2018   Actinic keratosis 07/17/2018   Chronic respiratory failure with hypoxia (HCC) 07/16/2018   Abnormal findings on diagnostic imaging of lung 06/18/2018   Acute on chronic respiratory failure with hypoxia (HCC)    Lung nodules 08/21/2017   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 Negative for fever.  HENT:  Positive for congestion. Negative for sinus pressure, sinus pain and sneezing.   Respiratory:  Positive for  cough and shortness of breath.        Objective:   Physical Exam Constitutional:      Appearance: Normal appearance. She is well-developed.  Cardiovascular:     Rate and Rhythm: Normal rate and regular rhythm.     Heart sounds: Normal heart sounds.  Pulmonary:     Effort: Pulmonary effort is normal.     Breath sounds: Normal breath sounds.  Skin:    General: Skin is warm.  Neurological:     General: No focal deficit present.     Mental Status: She is alert and oriented to person, place, and time.  Psychiatric:        Mood and Affect: Mood normal.        Behavior: Behavior normal.    BP 90/65   Pulse (!) 110   Temp (!) 97.5 F (36.4 C) (Temporal)   Ht 5\' 4"  (1.626 m)   Wt 110 lb (49.9 kg)   SpO2 90%   BMI 18.88 kg/m          Assessment & Plan:   Wynnette Delucia in today with chief complaint of Shortness of Breath and Cough (Productive/)   1. COPD exacerbation (HCC) (Primary) Run humidifier Force fluids RTO prn Smoking cessation encouraged - azithromycin (ZITHROMAX Z-PAK) 250 MG tablet; As directed  Dispense: 6 tablet; Refill: 0 - predniSONE (STERAPRED UNI-PAK 21 TAB) 10 MG (21) TBPK tablet; As directed x 6 days  Dispense: 21 tablet; Refill: 0 - HYDROcodone bit-homatropine (HYCODAN) 5-1.5 MG/5ML syrup; Take 5 mLs by mouth every 8 (eight)  hours as needed for cough.  Dispense: 120 mL; Refill: 0    The above assessment and management plan was discussed with the patient. The patient verbalized understanding of and has agreed to the management plan. Patient is aware to call the clinic if symptoms persist or worsen. Patient is aware when to return to the clinic for a follow-up visit. Patient educated on when it is appropriate to go to the emergency department.   Mary-Margaret Daphine Deutscher, FNP

## 2023-11-28 ENCOUNTER — Encounter: Payer: Self-pay | Admitting: Adult Health

## 2023-11-28 ENCOUNTER — Telehealth (INDEPENDENT_AMBULATORY_CARE_PROVIDER_SITE_OTHER): Payer: Medicare HMO | Admitting: Adult Health

## 2023-11-28 VITALS — BP 100/76 | HR 108 | Ht 64.0 in | Wt 112.0 lb

## 2023-11-28 DIAGNOSIS — J441 Chronic obstructive pulmonary disease with (acute) exacerbation: Secondary | ICD-10-CM

## 2023-11-28 DIAGNOSIS — J9611 Chronic respiratory failure with hypoxia: Secondary | ICD-10-CM

## 2023-11-28 DIAGNOSIS — Z720 Tobacco use: Secondary | ICD-10-CM

## 2023-11-28 DIAGNOSIS — E440 Moderate protein-calorie malnutrition: Secondary | ICD-10-CM | POA: Diagnosis not present

## 2023-11-28 MED ORDER — PREDNISONE 10 MG PO TABS: ORAL_TABLET | ORAL | 0 refills | Status: DC

## 2023-11-28 MED ORDER — AMOXICILLIN-POT CLAVULANATE 875-125 MG PO TABS: 1.0000 | ORAL_TABLET | Freq: Two times a day (BID) | ORAL | 0 refills | Status: DC

## 2023-11-28 NOTE — Patient Instructions (Addendum)
 Augmentin  875mg  Twice daily for 10 days, take with food.  Mucinex  DM Twice daily  As needed  cough/congestion  High protein diet.  Prednisone  taper over next week  Continue on Oxygen  2l/m with activity and At bedtime   Continue on Breztri  2 puffs twice daily, rinse after use Albuterol  or Duoneb As needed   Activity as tolerated.  Work on not smoking.  Follow up with Dr. Avis in 3-4 weeks and As needed   Please contact office for sooner follow up if symptoms do not improve or worsen or seek emergency care

## 2023-11-28 NOTE — Progress Notes (Signed)
 Feeling bad for a few months, several rounds of antibiotics/prednisone .  Fever 99.9 (recently), has had chills and body aches as well.  She thought she was getting better when she finished the last round of antibiotics and then it got worse.  Productive cough with blood in mucous this am (a streak or speck).  Thick green/yellow.  Worse in the am, it take her forever to get going  in the am where the mucous has settled in her chest over night.  The mucous chokes her and makes her sick to her stomach.  Chest and nasal congestion.  Nasal mucous is clear.  83% on RA.  83%-92% today on RA.  She has a nebulizer with duoneb, using 2-3 times per day, helps cough up mucous.

## 2023-11-28 NOTE — Progress Notes (Signed)
 Virtual Visit via Video Note  I connected with Alicia Sanchez on 11/28/23 at 11:00 AM EST by a video enabled telemedicine application and verified that I am speaking with the correct person using two identifiers.  Location: Patient: Home  Provider: Office    I discussed the limitations of evaluation and management by telemedicine and the availability of in person appointments. The patient expressed understanding and agreed to proceed.  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 No pets, exotic pets. Occupation:Customer sevice rep. previously worked in [employer] Exposures: His current dust exposure and previous exposure to cotton fiber in her line of work Smoking history: 35-pack-year smoking history. Continues to smoke 1 pack per day  Today's video visit is an acute office visit.  Patient complains over the last 6 weeks she has had recurrent COPD exacerbations.  She was initially seen by her primary care provider and given Omnicef  and prednisone .  Patient says she had some minimal improvement in symptoms.  Chest x-ray shows COPD changes without acute process.  She was seen back with her primary care provider and given a Z-Pak and prednisone  without much improvement.  She continues to have ongoing cough, congestion with thick green-brown mucus.  Has had some intermittent low-grade fevers.  Cough and congestion have been worse the last few days.  She did see a speck of blood this morning.  Patient has oxygen  that she uses 2 L with activity and at bedtime.  Patient says she has been wearing it more consistently over the last couple months that she has been sick.  O2 saturations dropped down to 83% when she is walking off of her oxygen .  On oxygen  she remains at 92% on 2 L.  Patient is currently on Breztri  twice daily.  Has albuterol   inhaler and DuoNeb that she uses at least twice a day.  She is using Mucinex .  Patient does continue to smoke.  We discussed smoking cessation in detail.  Patient says she has a good appetite and eats but her weight remains low.  Current weight is at 112 pounds with a BMI at 19.  Patient denies any frank hemoptysis, nausea vomiting or edema.    Observations/Objective: 11/28/2023 -appears in no acute distress.  Patient is not on oxygen .  No obvious accessory muscle use while talking.  Screening CT chest 05/08/17-centrilobular emphysema, calcified granuloma, tiny subcentimeter pulmonary nodules. CTA 05/21/2018- emphysema, scattered pulmonary nodule.  4 mm right lower lung nodule is new. I have reviewed the images personally   CTA [facility]) 03/11/2019-no pulmonary embolism.  No active cardiopulmonary disease.  Coronary artery calcification No images available in PACS   Screening CT 12/17/2019- emphysema, tiny calcified pulmonary nodules   CTA 02/09/2020- no PE, lungs are clear with emphysema.   Screening CT 04/03/2021- emphysema, stable pulmonary nodules.   Screening CT chest 04/03/2022- moderate emphysema, stable lung nodules.   Screening CT chest 04/12/2023-  I have reviewed the images personally.   PFTs  07/24/17 FVC 2.54 (74%], FEV1 1.55 (58%], F/F 61, TLC 103%, DLCO 64% Moderate obstruction and diffusion impairment with air trapping   08/18/2018 FVC 2.26 [70%], FEV1 1.23 [49%], F/F 54, TLC 5.98 [123%], DLCO 12.55 [56%] Severe obstructive airway disease.  Worse compared to 2018   Labs CBC/1/17-absolute eosinophil count 0 Alpha-1 antitrypsin 04/25/2017-136, PI MM  Assessment and Plan: Recurrent COPD  exacerbation and active smoker.  Patient has severe COPD with emphysema.  She is on triple therapy maintenance inhaler.  Along with nebulized bronchodilators.  Have encouraged her on smoking cessation.  Will treat with a prolonged course of antibiotics with Augmentin  x 10 days.  Prednisone   taper.  Patient education given on frequent steroid use and potential complications.  Patient is to follow back in office in about 3 to 4 weeks.  If not improving will need sooner follow-up with imaging and sputum culture.  Chronic respiratory failure.  Continue on oxygen  2 L to maintain O2 saturations greater than 88 to 90%.  Tobacco abuse-smoking cessation discussed Continue with lung cancer CT screening program  Protein calorie malnutrition-BMI 19.-Encouraged on high-protein diet.   Plan  Patient Instructions  Augmentin  875mg  Twice daily for 10 days, take with food.  Mucinex  DM Twice daily  As needed  cough/congestion  High protein diet.  Prednisone  taper over next week  Continue on Oxygen  2l/m with activity and At bedtime   Continue on Breztri  2 puffs twice daily, rinse after use Albuterol  or Duoneb As needed   Activity as tolerated.  Work on not smoking.  Follow up with Dr. Avis in 3-4 weeks and As needed   Please contact office for sooner follow up if symptoms do not improve or worsen or seek emergency care      Follow Up Instructions:    I discussed the assessment and treatment plan with the patient. The patient was provided an opportunity to ask questions and all were answered. The patient agreed with the plan and demonstrated an understanding of the instructions.   The patient was advised to call back or seek an in-person evaluation if the symptoms worsen or if the condition fails to improve as anticipated.  I provided 30  minutes of non-face-to-face time during this encounter.   Madelin Stank, NP

## 2023-12-12 DIAGNOSIS — R062 Wheezing: Secondary | ICD-10-CM | POA: Diagnosis not present

## 2023-12-12 DIAGNOSIS — J449 Chronic obstructive pulmonary disease, unspecified: Secondary | ICD-10-CM | POA: Diagnosis not present

## 2023-12-16 ENCOUNTER — Telehealth: Payer: Self-pay

## 2023-12-16 NOTE — Telephone Encounter (Signed)
She needs to be seen if she is requiring new medication

## 2023-12-16 NOTE — Telephone Encounter (Signed)
Copied from CRM [id number]. Topic: Clinical - Medical Advice >> Dec 16, 2023 10:28 AM Zane Herald wrote: Reason for CRM: patient wants doctor to call in some Zofran

## 2023-12-17 NOTE — Telephone Encounter (Signed)
Left message for pt to call back to make appt

## 2023-12-23 ENCOUNTER — Ambulatory Visit (INDEPENDENT_AMBULATORY_CARE_PROVIDER_SITE_OTHER): Payer: Medicare HMO

## 2023-12-23 VITALS — Ht 64.0 in | Wt 112.0 lb

## 2023-12-23 DIAGNOSIS — Z0000 Encounter for general adult medical examination without abnormal findings: Secondary | ICD-10-CM

## 2023-12-23 NOTE — Progress Notes (Signed)
Subjective:   Alicia Sanchez is a 63 y.o. female who presents for Medicare Annual (Subsequent) preventive examination.  Visit Complete: Virtual I connected with  Alicia Sanchez on 12/23/23 by a audio enabled telemedicine application and verified that I am speaking with the correct person using two identifiers.  Patient Location: Home  Provider Location: Home Office  This patient declined Interactive audio and video telecommunications. Therefore the visit was completed with audio only.  I discussed the limitations of evaluation and management by telemedicine. The patient expressed understanding and agreed to proceed.  Vital Signs: Because this visit was a virtual/telehealth visit, some criteria may be missing or patient reported. Any vitals not documented were not able to be obtained and vitals that have been documented are patient reported.  Cardiac Risk Factors include: advanced age (>45men, >58 women);hypertension;dyslipidemia;smoking/ tobacco exposure     Objective:    Today's Vitals   12/23/23 1059  Weight: 112 lb (50.8 kg)  Height: 5\' 4"  (1.626 m)   Body mass index is 19.22 kg/m.     12/23/2023   11:43 AM 05/20/2023   10:02 AM 03/12/2023    8:16 AM 03/11/2023    4:07 PM 12/20/2022    8:54 AM 10/25/2021    5:23 PM 08/08/2021    1:36 PM  Advanced Directives  Does Patient Have a Medical Advance Directive? No No  No No No No  Would patient like information on creating a medical advance directive? Yes (MAU/Ambulatory/Procedural Areas - Information given)  No - Patient declined  No - Patient declined Yes (MAU/Ambulatory/Procedural Areas - Information given)     Current Medications (verified) Outpatient Encounter Medications as of 12/23/2023  Medication Sig   albuterol (VENTOLIN HFA) 108 (90 Base) MCG/ACT inhaler INHALE 2 PUFFS INTO THE LUNGS EVERY 6 HOURS AS NEEDED FOR WHEEZING OR SHORTNESS OF BREATH.   alendronate (FOSAMAX) 70 MG tablet TAKE 1 TABLET EVERY 7 DAYS WITH A FULL  GLASS OF WATER ON AN EMPTY STOMACH   ALPRAZolam (XANAX) 0.5 MG tablet TAKE 1 TABLET BY MOUTH TWO TIMES DAILY   atorvastatin (LIPITOR) 40 MG tablet TAKE 1 TABLET EVERY DAY   Budeson-Glycopyrrol-Formoterol (BREZTRI AEROSPHERE) 160-9-4.8 MCG/ACT AERO Inhale 2 puffs into the lungs 2 (two) times daily.   Calcium-Magnesium-Zinc (CAL-MAG-ZINC PO) Take 1 tablet by mouth daily.   cetirizine (ZYRTEC) 10 MG tablet Take 10 mg by mouth daily.   Cholecalciferol (VITAMIN D3 MAXIMUM STRENGTH) 125 MCG (5000 UT) capsule Take 5,000 Units by mouth daily.   DULoxetine (CYMBALTA) 60 MG capsule Take 2 capsules (120 mg total) by mouth daily.   ipratropium-albuterol (DUONEB) 0.5-2.5 (3) MG/3ML SOLN INHALE THE CONTENTS OF 1 VIAL VIA NEBULIZER EVERY 6 HOURS AS NEEDED (Patient taking differently: Take 3 mLs by nebulization every 4 (four) hours as needed (Shortness of breath).)   meloxicam (MOBIC) 7.5 MG tablet Take 1 tablet (7.5 mg total) by mouth daily.   metoprolol tartrate (LOPRESSOR) 25 MG tablet Take 0.5 tablets (12.5 mg total) by mouth 2 (two) times daily as needed (palpitations).   midodrine (PROAMATINE) 2.5 MG tablet Take 2.5 mg by mouth 3 (three) times daily as needed (bp below 90/60).   nitroGLYCERIN (NITROSTAT) 0.4 MG SL tablet Place 1 tablet (0.4 mg total) under the tongue every 5 (five) minutes x 3 doses as needed for chest pain (if o relief after 2nd dose, proceed to the ED for an evaluation or call 911).   omeprazole (PRILOSEC) 40 MG capsule Take 1 capsule (40 mg total) by  mouth daily.   QUEtiapine (SEROQUEL) 200 MG tablet Take 1 tablet (200 mg total) by mouth at bedtime.   Respiratory Therapy Supplies (NEBULIZER/TUBING/MOUTHPIECE) KIT 1 Units by Does not apply route 4 (four) times daily. GIVE face mask and tubing for nebulizer. J44.1   rOPINIRole (REQUIP) 3 MG tablet Take 1 tablet (3 mg total) by mouth at bedtime.   acetaminophen (TYLENOL) 500 MG tablet Take 1,000 mg by mouth every 6 (six) hours as needed for  moderate pain.   amoxicillin-clavulanate (AUGMENTIN) 875-125 MG tablet Take 1 tablet by mouth 2 (two) times daily. (Patient not taking: Reported on 12/23/2023)   aspirin EC 81 MG tablet Take 81 mg by mouth daily.   HYDROcodone bit-homatropine (HYCODAN) 5-1.5 MG/5ML syrup Take 5 mLs by mouth every 8 (eight) hours as needed for cough. (Patient not taking: Reported on 12/23/2023)   predniSONE (DELTASONE) 10 MG tablet 4 tabs for 2 days, then 3 tabs for 2 days, 2 tabs for 2 days, then 1 tab for 2 days, then stop (Patient not taking: Reported on 12/23/2023)   [DISCONTINUED] fexofenadine (ALLEGRA ALLERGY) 180 MG tablet Take 1 tablet (180 mg total) by mouth daily. (Patient not taking: Reported on 12/23/2023)   No facility-administered encounter medications on file as of 12/23/2023.    Allergies (verified) Doxycycline   History: Past Medical History:  Diagnosis Date   Anxiety    Aortic atherosclerosis (HCC)    Chronic kidney disease    COPD (chronic obstructive pulmonary disease) (HCC)    Depression    Headache    History of chest pain    History of syncope    Hypotension    Osteoporosis    Palpitations    Panic attacks    Pneumonia    Restless legs syndrome (RLS)    Past Surgical History:  Procedure Laterality Date   ABDOMINAL HYSTERECTOMY     BIOPSY  05/20/2023   Procedure: BIOPSY;  Surgeon: Sherrilyn Rist, MD;  Location: [facility];  Service: Gastroenterology;;   COLONOSCOPY WITH PROPOFOL N/A 05/20/2023   Procedure: COLONOSCOPY WITH PROPOFOL;  Surgeon: Sherrilyn Rist, MD;  Location: [facility];  Service: Gastroenterology;  Laterality: N/A;   ESOPHAGOGASTRODUODENOSCOPY (EGD) WITH PROPOFOL N/A 05/20/2023   Procedure: ESOPHAGOGASTRODUODENOSCOPY (EGD) WITH PROPOFOL;  Surgeon: Sherrilyn Rist, MD;  Location: [facility];  Service: Gastroenterology;  Laterality: N/A;   Family History  Problem Relation Age of Onset   COPD Mother    COPD Father    Diabetes type II Sister    Kidney  failure Sister    Diabetes type II Brother    COPD Maternal Grandfather    Breast cancer Neg Hx    Liver disease Neg Hx    Esophageal cancer Neg Hx    Colon cancer Neg Hx    Social History   Socioeconomic History   Marital status: Married    Spouse name: Not on file   Number of children: 2   Years of education: Not on file   Highest education level: Not on file  Occupational History   Not on file  Tobacco Use   Smoking status: Every Day    Current packs/day: 0.50    Average packs/day: 0.5 packs/day for 42.0 years (21.0 ttl pk-yrs)    Types: Cigarettes   Smokeless tobacco: Never   Tobacco comments:    Smoking 4-5 cigarettes.  11/28/2023 hfb  Vaping Use   Vaping status: Never Used  Substance and Sexual Activity  Alcohol use: Yes    Comment: soical   Drug use: Yes    Frequency: 1.0 times per week    Types: Marijuana   Sexual activity: Yes    Birth control/protection: Surgical  Other Topics Concern   Not on file  Social History Narrative   Not on file   Social Drivers of Health   Financial Resource Strain: Low Risk  (12/23/2023)   Overall Financial Resource Strain (CARDIA)    Difficulty of Paying Living Expenses: Not hard at all  Food Insecurity: No Food Insecurity (12/23/2023)   Hunger Vital Sign    Worried About Running Out of Food in the Last Year: Never true    Ran Out of Food in the Last Year: Never true  Transportation Needs: No Transportation Needs (12/23/2023)   PRAPARE - Administrator, Civil Service (Medical): No    Lack of Transportation (Non-Medical): No  Physical Activity: Inactive (12/23/2023)   Exercise Vital Sign    Days of Exercise per Week: 0 days    Minutes of Exercise per Session: 0 min  Stress: No Stress Concern Present (12/23/2023)   [employer] of Occupational Health - Occupational Stress Questionnaire    Feeling of Stress : Not at all  Social Connections: Moderately Isolated (12/23/2023)   Social Connection and Isolation Panel  [NHANES]    Frequency of Communication with Friends and Family: More than three times a week    Frequency of Social Gatherings with Friends and Family: Three times a week    Attends Religious Services: More than 4 times per year    Active Member of Clubs or Organizations: No    Attends Banker Meetings: Never    Marital Status: Separated    Tobacco Counseling Ready to quit: Not Answered Counseling given: Not Answered Tobacco comments: Smoking 4-5 cigarettes.  11/28/2023 hfb   Clinical Intake:  Pre-visit preparation completed: Yes  Pain : No/denies pain  Diabetes: No  How often do you need to have someone help you when you read instructions, pamphlets, or other written materials from your doctor or pharmacy?: 1 - Never  Interpreter Needed?: No  Information entered by :: Kandis Fantasia LPN   Activities of Daily Living    12/23/2023   11:43 AM 03/12/2023    8:05 AM  In your present state of health, do you have any difficulty performing the following activities:  Hearing? 0   Vision? 0   Difficulty concentrating or making decisions? 0   Walking or climbing stairs? 0   Dressing or bathing? 0   Doing errands, shopping? 0 0  Preparing Food and eating ? N   Using the Toilet? N   In the past six months, have you accidently leaked urine? N   Do you have problems with loss of bowel control? N   Managing your Medications? N   Managing your Finances? N   Housekeeping or managing your Housekeeping? N     Patient Care Team: [name], DO as PCP - General (Family Medicine) Wyline Mood Dorothe Pea, MD as PCP - Cardiology (Cardiology) Chilton Greathouse, MD as Consulting Physician (Pulmonary Disease) Danella Maiers, [facility] (Pharmacist) Mozingo, Thereasa Solo, NP as Nurse Practitioner (Psychiatry) Sharlene Dory, NP as Nurse Practitioner (Cardiology)  Indicate any recent Medical Services you may have received from other than [facility] providers in the past year (date may  be approximate).     Assessment:   This is a routine wellness examination for Iniya.  Hearing/Vision screen Hearing Screening - Comments:: Denies hearing difficulties   Vision Screening - Comments:: Wears rx glasses - up to date with routine eye exams with Dr. Desiree Lucy     Goals Addressed   None   Depression Screen    12/23/2023   11:41 AM 10/21/2023    4:06 PM 08/23/2023   12:23 PM 07/16/2023   11:11 AM 03/25/2023   10:51 AM 02/04/2023    2:00 PM 01/16/2023   11:00 AM  PHQ 2/9 Scores  PHQ - 2 Score 2 2 3 2 2  0 2  PHQ- 9 Score 9 10 15 11 9 6 5     Fall Risk    12/23/2023   11:43 AM 11/28/2023   10:58 AM 10/21/2023    4:07 PM 08/23/2023   12:23 PM 03/25/2023   10:41 AM  Fall Risk   Falls in the past year? 0 1 1 1  0  Number falls in past yr: 0 1 0 1 0  Injury with Fall? 0 1 1 1  0  Risk for fall due to : No Fall Risks History of fall(s);Orthopedic patient History of fall(s);Impaired balance/gait History of fall(s) No Fall Risks  Follow up Falls prevention discussed;Education provided;Falls evaluation completed  Falls evaluation completed Education provided     MEDICARE RISK AT HOME: Medicare Risk at Home Any stairs in or around the home?: No If so, are there any without handrails?: No Home free of loose throw rugs in walkways, pet beds, electrical cords, etc?: Yes Adequate lighting in your home to reduce risk of falls?: Yes Life alert?: No Use of a cane, walker or w/c?: No Grab bars in the bathroom?: Yes Shower chair or bench in shower?: No Elevated toilet seat or a handicapped toilet?: Yes  TIMED UP AND GO:  Was the test performed?  No    Cognitive Function:    10/25/2021    3:33 PM  MMSE - Mini Mental State Exam  Orientation to time 5  Orientation to Place 5  Registration 3  Attention/ Calculation 5  Recall 3  Language- name 2 objects 2  Language- repeat 1  Language- follow 3 step command 3  Language- read & follow direction 1  Write a sentence 1  Copy  design 1  Total score 30        12/23/2023   11:43 AM 12/20/2022    8:54 AM  6CIT Screen  What Year? 0 points 0 points  What month? 0 points 0 points  What time? 0 points 0 points  Count back from 20 0 points 0 points  Months in reverse 0 points 0 points  Repeat phrase 0 points 0 points  Total Score 0 points 0 points    Immunizations Immunization History  Administered Date(s) Administered   Influenza Split 08/25/2016   Influenza,inj,Quad PF,6+ Mos 09/25/2017, 07/16/2018, 10/25/2020, 08/30/2021, 09/24/2022   Influenza-Unspecified 09/25/2017, 07/16/2018, 08/30/2021   Moderna Sars-Covid-2 Vaccination 01/19/2020, 02/16/2020   Pneumococcal Polysaccharide-23 05/23/2018   Respiratory Syncytial Virus Vaccine,Recomb Aduvanted(Arexvy) 09/24/2022   Tdap 05/19/2007, 07/08/2014    TDAP status: Up to date  Flu Vaccine status: Due, Education has been provided regarding the importance of this vaccine. Advised may receive this vaccine at local pharmacy or Health Dept. Aware to provide a copy of the vaccination record if obtained from local pharmacy or Health Dept. Verbalized acceptance and understanding.  Pneumococcal vaccine status: Due, Education has been provided regarding the importance of this vaccine. Advised may receive this vaccine at local  pharmacy or Health Dept. Aware to provide a copy of the vaccination record if obtained from local pharmacy or Health Dept. Verbalized acceptance and understanding.  Covid-19 vaccine status: Information provided on how to obtain vaccines.   Qualifies for Shingles Vaccine? Yes   Zostavax completed No   Shingrix Completed?: No.    Education has been provided regarding the importance of this vaccine. Patient has been advised to call insurance company to determine out of pocket expense if they have not yet received this vaccine. Advised may also receive vaccine at local pharmacy or Health Dept. Verbalized acceptance and understanding.  Screening  Tests Health Maintenance  Topic Date Due   Pneumococcal Vaccine 32-45 Years old (2 of 2 - PCV) 05/24/2019   COVID-19 Vaccine (3 - Moderna risk series) 03/15/2020   Cervical Cancer Screening (HPV/Pap Cotest)  12/11/2021   Zoster Vaccines- Shingrix (1 of 2) 01/19/2024 (Originally 10/28/1980)   INFLUENZA VACCINE  02/17/2024 (Originally 06/20/2023)   Fecal DNA (Cologuard)  10/20/2024 (Originally 10/21/2023)   MAMMOGRAM  12/25/2023   Lung Cancer Screening  04/11/2024   DTaP/Tdap/Td (3 - Td or Tdap) 07/08/2024   Medicare Annual Wellness (AWV)  12/22/2024   Colonoscopy  05/19/2026   Hepatitis C Screening  Completed   HIV Screening  Completed   HPV VACCINES  Aged Out    Health Maintenance  Health Maintenance Due  Topic Date Due   Pneumococcal Vaccine 34-35 Years old (2 of 2 - PCV) 05/24/2019   COVID-19 Vaccine (3 - Moderna risk series) 03/15/2020   Cervical Cancer Screening (HPV/Pap Cotest)  12/11/2021    Colorectal cancer screening: Type of screening: Colonoscopy. Completed 05/20/23. Repeat every 3 years  Mammogram status: Completed 12/24/22. Repeat every year  Lung Cancer Screening: (Low Dose CT Chest recommended if Age 48-80 years, 20 pack-year currently smoking OR have quit w/in 15years.) does qualify.   Lung Cancer Screening Referral: last 04/12/23  Additional Screening:  Hepatitis C Screening: does qualify; Completed 11/24/18  Vision Screening: Recommended annual ophthalmology exams for early detection of glaucoma and other disorders of the eye. Is the patient up to date with their annual eye exam?  Yes  Who is the provider or what is the name of the office in which the patient attends annual eye exams? Dr. Desiree Lucy  If pt is not established with a provider, would they like to be referred to a provider to establish care? No .   Dental Screening: Recommended annual dental exams for proper oral hygiene  Community Resource Referral / Chronic Care Management: CRR required this  visit?  No   CCM required this visit?  No     Plan:     I have personally reviewed and noted the following in the patient's chart:   Medical and social history Use of alcohol, tobacco or illicit drugs  Current medications and supplements including opioid prescriptions. Patient is not currently taking opioid prescriptions. Functional ability and status Nutritional status Physical activity Advanced directives List of other physicians Hospitalizations, surgeries, and ER visits in previous 12 months Vitals Screenings to include cognitive, depression, and falls Referrals and appointments  In addition, I have reviewed and discussed with patient certain preventive protocols, quality metrics, and best practice recommendations. A written personalized care plan for preventive services as well as general preventive health recommendations were provided to patient.     Kandis Fantasia Post, [place]   11/23/7827   After Visit Summary: (MyChart) Due to this being a telephonic visit, the after visit  summary with patients personalized plan was offered to patient via MyChart   Nurse Notes: No concerns at this time

## 2023-12-23 NOTE — Patient Instructions (Signed)
Alicia Sanchez , Thank you for taking time to come for your Medicare Wellness Visit. I appreciate your ongoing commitment to your health goals. Please review the following plan we discussed and let me know if I can assist you in the future.   Referrals/Orders/Follow-Ups/Clinician Recommendations: Aim for 30 minutes of exercise or brisk walking, 6-8 glasses of water, and 5 servings of fruits and vegetables each day.  This is a list of the screening recommended for you and due dates:  Health Maintenance  Topic Date Due   Pneumococcal Vaccination (2 of 2 - PCV) 05/24/2019   COVID-19 Vaccine (3 - Moderna risk series) 03/15/2020   Pap with HPV screening  12/11/2021   Zoster (Shingles) Vaccine (1 of 2) 01/19/2024*   Flu Shot  02/17/2024*   Cologuard (Stool DNA test)  10/20/2024*   Mammogram  12/25/2023   Screening for Lung Cancer  04/11/2024   DTaP/Tdap/Td vaccine (3 - Td or Tdap) 07/08/2024   Medicare Annual Wellness Visit  12/22/2024   Colon Cancer Screening  05/19/2026   Hepatitis C Screening  Completed   HIV Screening  Completed   HPV Vaccine  Aged Out  *Topic was postponed. The date shown is not the original due date.    Advanced directives: (ACP Link)Information on Advanced Care Planning can be found at [facility] Advance Health Care Directives Advance Health Care Directives ([email or web address]   Next Medicare Annual Wellness Visit scheduled for next year: Yes

## 2023-12-31 ENCOUNTER — Other Ambulatory Visit: Payer: Self-pay | Admitting: Adult Health

## 2023-12-31 ENCOUNTER — Ambulatory Visit: Payer: Medicare HMO | Admitting: Family Medicine

## 2023-12-31 DIAGNOSIS — F410 Panic disorder [episodic paroxysmal anxiety] without agoraphobia: Secondary | ICD-10-CM

## 2023-12-31 DIAGNOSIS — F411 Generalized anxiety disorder: Secondary | ICD-10-CM

## 2023-12-31 MED ORDER — BREZTRI AEROSPHERE 160-9-4.8 MCG/ACT IN AERO: 2.0000 | INHALATION_SPRAY | Freq: Two times a day (BID) | RESPIRATORY_TRACT | 5 refills | Status: DC

## 2023-12-31 NOTE — Telephone Encounter (Signed)
Fax request from [facility] New rx sent Can you f/u to re-enroll patient in March per previous note? Thank you!

## 2023-12-31 NOTE — Addendum Note (Signed)
Addended by: Vanice Sarah D on: 12/31/2023 09:43 AM   Modules accepted: Orders

## 2023-12-31 NOTE — Telephone Encounter (Signed)
LF 1/15 LV 12/18 NV 03/18 Changed start date to tomorrow.

## 2024-01-07 ENCOUNTER — Telehealth: Payer: Self-pay

## 2024-01-07 DIAGNOSIS — J432 Centrilobular emphysema: Secondary | ICD-10-CM

## 2024-01-08 ENCOUNTER — Other Ambulatory Visit: Payer: Self-pay | Admitting: Family Medicine

## 2024-01-08 DIAGNOSIS — Z1231 Encounter for screening mammogram for malignant neoplasm of breast: Secondary | ICD-10-CM

## 2024-01-12 DIAGNOSIS — R062 Wheezing: Secondary | ICD-10-CM | POA: Diagnosis not present

## 2024-01-12 DIAGNOSIS — J449 Chronic obstructive pulmonary disease, unspecified: Secondary | ICD-10-CM | POA: Diagnosis not present

## 2024-01-14 NOTE — Progress Notes (Signed)
 Pharmacy Medication Assistance Program Note    02/11/2024  Patient ID: Alicia Sanchez, female   DOB: Jan 23, 1961, 63 y.o.   MRN: [id number]     01/07/2024  [facility]reach [facility]  Manufacturer [facility]ication [facility] Nurse, adult Drugs Bretztri  Type of Radiographer, therapeutic Assistance  Date Application Sent to Patient 01/14/2024  Application Items Requested Application  Date Application Received From Patient 02/11/2024  Application Items Received From Patient Application    Faxed Renewal   Could a new 90 day RX of patients Breztri be sent to [facility] for patients assistace?

## 2024-01-15 ENCOUNTER — Ambulatory Visit
Admission: RE | Admit: 2024-01-15 | Discharge: 2024-01-15 | Disposition: A | Discharge status: Home / Self Care | Payer: Medicare HMO | Source: Ambulatory Visit | Attending: Family Medicine

## 2024-01-15 DIAGNOSIS — Z1231 Encounter for screening mammogram for malignant neoplasm of breast: Secondary | ICD-10-CM

## 2024-01-17 ENCOUNTER — Other Ambulatory Visit (HOSPITAL_COMMUNITY)
Admission: RE | Admit: 2024-01-17 | Discharge: 2024-01-17 | Disposition: A | Discharge status: Home / Self Care | Source: Ambulatory Visit | Attending: Family Medicine | Admitting: Family Medicine

## 2024-01-17 ENCOUNTER — Ambulatory Visit (INDEPENDENT_AMBULATORY_CARE_PROVIDER_SITE_OTHER): Payer: Medicare HMO | Admitting: Family Medicine

## 2024-01-17 ENCOUNTER — Encounter: Payer: Self-pay | Admitting: Family Medicine

## 2024-01-17 VITALS — BP 117/70 | HR 88 | Temp 98.7°F | Ht 64.0 in | Wt 114.0 lb

## 2024-01-17 DIAGNOSIS — R110 Nausea: Secondary | ICD-10-CM

## 2024-01-17 DIAGNOSIS — Z0001 Encounter for general adult medical examination with abnormal findings: Secondary | ICD-10-CM | POA: Diagnosis not present

## 2024-01-17 DIAGNOSIS — J432 Centrilobular emphysema: Secondary | ICD-10-CM | POA: Diagnosis not present

## 2024-01-17 DIAGNOSIS — Z23 Encounter for immunization: Secondary | ICD-10-CM

## 2024-01-17 DIAGNOSIS — F331 Major depressive disorder, recurrent, moderate: Secondary | ICD-10-CM | POA: Diagnosis not present

## 2024-01-17 DIAGNOSIS — Z124 Encounter for screening for malignant neoplasm of cervix: Secondary | ICD-10-CM

## 2024-01-17 DIAGNOSIS — Z0000 Encounter for general adult medical examination without abnormal findings: Secondary | ICD-10-CM

## 2024-01-17 DIAGNOSIS — G2581 Restless legs syndrome: Secondary | ICD-10-CM

## 2024-01-17 DIAGNOSIS — M810 Age-related osteoporosis without current pathological fracture: Secondary | ICD-10-CM

## 2024-01-17 MED ORDER — ROPINIROLE HCL 3 MG PO TABS: 3.0000 mg | ORAL_TABLET | Freq: Every day | ORAL | 3 refills | Status: AC

## 2024-01-17 MED ORDER — DULOXETINE HCL 60 MG PO CPEP: 120.0000 mg | ORAL_CAPSULE | Freq: Every day | ORAL | 3 refills | Status: DC

## 2024-01-17 MED ORDER — ALBUTEROL SULFATE HFA 108 (90 BASE) MCG/ACT IN AERS: 2.0000 | INHALATION_SPRAY | Freq: Four times a day (QID) | RESPIRATORY_TRACT | 3 refills | Status: DC | PRN

## 2024-01-17 MED ORDER — ALENDRONATE SODIUM 70 MG PO TABS: ORAL_TABLET | ORAL | 3 refills | Status: AC

## 2024-01-17 MED ORDER — ATORVASTATIN CALCIUM 40 MG PO TABS: ORAL_TABLET | ORAL | 3 refills | Status: DC

## 2024-01-17 MED ORDER — ONDANSETRON 4 MG PO TBDP: 4.0000 mg | ORAL_TABLET | Freq: Three times a day (TID) | ORAL | 0 refills | Status: DC | PRN

## 2024-01-17 NOTE — Progress Notes (Signed)
 Alicia Sanchez is a 63 y.o. female presents to office today for annual physical exam examination.    Concerns today include: 1.  Nausea She continues to have almost daily nausea despite having had evaluation with gastroenterology.  She reports they are not quite sure what is going on.  She is compliant with her PPI.  She does take meloxicam daily as needed for pain.  She reports sometimes symptoms seem to be worse at night than during the daytime.  She is not sure if the medications are causing her symptoms or what.  She reports no unplanned weight loss, night sweats, GI bleeding etc.  Substance use: Half pack per day of tobacco use.  Sees Dr. Isaiah Serge with pulmonology, this is who does her yearly lung cancer screening Health Maintenance Due  Topic Date Due   Pneumococcal Vaccine 85-1 Years old (2 of 2 - PCV) 05/24/2019   COVID-19 Vaccine (3 - Moderna risk series) 03/15/2020   Cervical Cancer Screening (HPV/Pap Cotest)  12/11/2021   Refills needed today: Ventolin  Immunization History  Administered Date(s) Administered   Influenza Split 08/25/2016   Influenza,inj,Quad PF,6+ Mos 09/25/2017, 07/16/2018, 10/25/2020, 08/30/2021, 09/24/2022   Influenza-Unspecified 09/25/2017, 07/16/2018, 08/30/2021   Moderna Sars-Covid-2 Vaccination 01/19/2020, 02/16/2020   Pneumococcal Polysaccharide-23 05/23/2018   Respiratory Syncytial Virus Vaccine,Recomb Aduvanted(Arexvy) 09/24/2022   Tdap 05/19/2007, 07/08/2014   Past Medical History:  Diagnosis Date   Anxiety    Aortic atherosclerosis (HCC)    Chronic kidney disease    COPD (chronic obstructive pulmonary disease) (HCC)    Depression    Headache    History of chest pain    History of syncope    Hypotension    Osteoporosis    Palpitations    Panic attacks    Pneumonia    Restless legs syndrome (RLS)    Social History   Socioeconomic History   Marital status: Married    Spouse name: Not on file   Number of children: 2   Years of  education: Not on file   Highest education level: GED or equivalent  Occupational History   Not on file  Tobacco Use   Smoking status: Every Day    Current packs/day: 0.50    Average packs/day: 0.5 packs/day for 42.0 years (21.0 ttl pk-yrs)    Types: Cigarettes   Smokeless tobacco: Never   Tobacco comments:    Smoking 4-5 cigarettes.  11/28/2023 hfb  Vaping Use   Vaping status: Never Used  Substance and Sexual Activity   Alcohol use: Yes    Comment: soical   Drug use: Yes    Frequency: 1.0 times per week    Types: Marijuana   Sexual activity: Yes    Birth control/protection: Surgical  Other Topics Concern   Not on file  Social History Narrative   Not on file   Social Drivers of Health   Financial Resource Strain: Medium Risk (01/16/2024)   Overall Financial Resource Strain (CARDIA)    Difficulty of Paying Living Expenses: Somewhat hard  Food Insecurity: Food Insecurity Present (01/16/2024)   Hunger Vital Sign    Worried About Running Out of Food in the Last Year: Sometimes true    Ran Out of Food in the Last Year: Often true  Transportation Needs: No Transportation Needs (01/16/2024)   PRAPARE - Administrator, Civil Service (Medical): No    Lack of Transportation (Non-Medical): No  Physical Activity: Inactive (01/16/2024)   Exercise Vital Sign  Days of Exercise per Week: 0 days    Minutes of Exercise per Session: 0 min  Stress: No Stress Concern Present (01/16/2024)   [employer] of Occupational Health - Occupational Stress Questionnaire    Feeling of Stress : Only a little  Social Connections: Moderately Isolated (01/16/2024)   Social Connection and Isolation Panel [NHANES]    Frequency of Communication with Friends and Family: More than three times a week    Frequency of Social Gatherings with Friends and Family: Once a week    Attends Religious Services: Never    Database administrator or Organizations: Yes    Attends Banker Meetings:  Never    Marital Status: Separated  Intimate Partner Violence: Not At Risk (12/23/2023)   Humiliation, Afraid, Rape, and Kick questionnaire    Fear of Current or Ex-Partner: No    Emotionally Abused: No    Physically Abused: No    Sexually Abused: No   Past Surgical History:  Procedure Laterality Date   ABDOMINAL HYSTERECTOMY     BIOPSY  05/20/2023   Procedure: BIOPSY;  Surgeon: Sherrilyn Rist, MD;  Location: [facility];  Service: Gastroenterology;;   COLONOSCOPY WITH PROPOFOL N/A 05/20/2023   Procedure: COLONOSCOPY WITH PROPOFOL;  Surgeon: Sherrilyn Rist, MD;  Location: [facility];  Service: Gastroenterology;  Laterality: N/A;   ESOPHAGOGASTRODUODENOSCOPY (EGD) WITH PROPOFOL N/A 05/20/2023   Procedure: ESOPHAGOGASTRODUODENOSCOPY (EGD) WITH PROPOFOL;  Surgeon: Sherrilyn Rist, MD;  Location: [facility];  Service: Gastroenterology;  Laterality: N/A;   Family History  Problem Relation Age of Onset   COPD Mother    COPD Father    Diabetes type II Sister    Kidney failure Sister    Diabetes type II Brother    COPD Maternal Grandfather    Breast cancer Neg Hx    Liver disease Neg Hx    Esophageal cancer Neg Hx    Colon cancer Neg Hx     Current Outpatient Medications:    acetaminophen (TYLENOL) 500 MG tablet, Take 1,000 mg by mouth every 6 (six) hours as needed for moderate pain., Disp: , Rfl:    ALPRAZolam (XANAX) 0.5 MG tablet, Take 1 tablet (0.5 mg total) by mouth 2 (two) times daily., Disp: 60 tablet, Rfl: 0   aspirin EC 81 MG tablet, Take 81 mg by mouth daily., Disp: , Rfl:    Budeson-Glycopyrrol-Formoterol (BREZTRI AEROSPHERE) 160-9-4.8 MCG/ACT AERO, Inhale 2 puffs into the lungs 2 (two) times daily., Disp: 32.1 g, Rfl: 5   Calcium-Magnesium-Zinc (CAL-MAG-ZINC PO), Take 1 tablet by mouth daily., Disp: , Rfl:    cetirizine (ZYRTEC) 10 MG tablet, Take 10 mg by mouth daily., Disp: , Rfl:    Cholecalciferol (VITAMIN D3 MAXIMUM STRENGTH) 125 MCG (5000 UT) capsule, Take  5,000 Units by mouth daily., Disp: , Rfl:    ipratropium-albuterol (DUONEB) 0.5-2.5 (3) MG/3ML SOLN, INHALE THE CONTENTS OF 1 VIAL VIA NEBULIZER EVERY 6 HOURS AS NEEDED (Patient taking differently: Take 3 mLs by nebulization every 4 (four) hours as needed (Shortness of breath).), Disp: 360 mL, Rfl: 11   meloxicam (MOBIC) 7.5 MG tablet, Take 1 tablet (7.5 mg total) by mouth daily., Disp: 90 tablet, Rfl: 3   midodrine (PROAMATINE) 2.5 MG tablet, Take 2.5 mg by mouth 3 (three) times daily as needed (bp below 90/60)., Disp: , Rfl:    nitroGLYCERIN (NITROSTAT) 0.4 MG SL tablet, Place 1 tablet (0.4 mg total) under the tongue every 5 (five) minutes  x 3 doses as needed for chest pain (if o relief after 2nd dose, proceed to the ED for an evaluation or call 911)., Disp: 25 tablet, Rfl: 2   omeprazole (PRILOSEC) 40 MG capsule, Take 1 capsule (40 mg total) by mouth daily., Disp: 30 capsule, Rfl: 3   ondansetron (ZOFRAN-ODT) 4 MG disintegrating tablet, Take 1 tablet (4 mg total) by mouth every 8 (eight) hours as needed for nausea or vomiting., Disp: 20 tablet, Rfl: 0   QUEtiapine (SEROQUEL) 200 MG tablet, Take 1 tablet (200 mg total) by mouth at bedtime., Disp: 30 tablet, Rfl: 2   Respiratory Therapy Supplies (NEBULIZER/TUBING/MOUTHPIECE) KIT, 1 Units by Does not apply route 4 (four) times daily. GIVE face mask and tubing for nebulizer. J44.1, Disp: 1 kit, Rfl: 0   albuterol (VENTOLIN HFA) 108 (90 Base) MCG/ACT inhaler, Inhale 2 puffs into the lungs every 6 (six) hours as needed for wheezing or shortness of breath., Disp: 51 each, Rfl: 3   alendronate (FOSAMAX) 70 MG tablet, TAKE 1 TABLET EVERY 7 DAYS WITH A FULL GLASS OF WATER ON AN EMPTY STOMACH, Disp: 12 tablet, Rfl: 3   atorvastatin (LIPITOR) 40 MG tablet, TAKE 1 TABLET EVERY DAY, Disp: 90 tablet, Rfl: 3   DULoxetine (CYMBALTA) 60 MG capsule, Take 2 capsules (120 mg total) by mouth daily., Disp: 180 capsule, Rfl: 3   metoprolol tartrate (LOPRESSOR) 25 MG tablet,  Take 0.5 tablets (12.5 mg total) by mouth 2 (two) times daily as needed (palpitations)., Disp: 30 tablet, Rfl: 2   rOPINIRole (REQUIP) 3 MG tablet, Take 1 tablet (3 mg total) by mouth at bedtime., Disp: 90 tablet, Rfl: 3  Allergies  Allergen Reactions   Doxycycline     Chest pain     ROS: Review of Systems A comprehensive review of systems was negative except for: Eyes: positive for contacts/glasses Respiratory: positive for chronic bronchitis and emphysema Gastrointestinal: positive for nausea Musculoskeletal: positive for arthralgias    Physical exam BP 117/70   Pulse 88   Temp 98.7 F (37.1 C)   Ht 5\' 4"  (1.626 m)   Wt 114 lb (51.7 kg)   SpO2 (!) 88%   BMI 19.57 kg/m  General appearance: alert, cooperative, appears stated age, and no distress Head: Normocephalic, without obvious abnormality, atraumatic Eyes:  Exotropia of right eye.  Left eye with reactive pupil.  Sclera white Ears: normal TM's and external ear canals both ears Nose: Nares normal. Septum midline. Mucosa normal. No drainage or sinus tenderness. Throat: lips, mucosa, and tongue normal; teeth and gums normal Neck: no adenopathy, supple, symmetrical, trachea midline, and thyroid not enlarged, symmetric, no tenderness/mass/nodules Back: symmetric, no curvature. ROM normal. No CVA tenderness. Lungs: Mild expiratory wheezes noted.  Globally decreased breath sounds with normal work of breathing on room air Heart: regular rate and rhythm, S1, S2 normal, no murmur, click, rub or gallop Abdomen: soft, non-tender; bowel sounds normal; no masses,  no organomegaly Pelvic: external genitalia normal, no adnexal masses or tenderness, rectovaginal septum normal, vagina normal without discharge, and blind vaginal pouch.  Cervix is surgically absent Extremities: extremities normal, atraumatic, no cyanosis or edema Pulses: 2+ and symmetric Skin: Skin color, texture, turgor normal. No rashes or lesions Lymph nodes: Cervical,  supraclavicular, and axillary nodes normal. Neurologic: Grossly normal except for exotropia as above     01/17/2024   10:55 AM 12/23/2023   11:41 AM 10/21/2023    4:06 PM  Depression screen PHQ 2/9  Decreased Interest 1 1 1  Down, Depressed, Hopeless 1 1 1   PHQ - 2 Score 2 2 2   Altered sleeping 2 2 2   Tired, decreased energy 2 2 2   Change in appetite 1 1 1   Feeling bad or failure about yourself  1 1 1   Trouble concentrating 0 1 1  Moving slowly or fidgety/restless 0 0 1  Suicidal thoughts 0 0 0  PHQ-9 Score 8 9 10   Difficult doing work/chores Not difficult at all  Somewhat difficult      01/17/2024   10:55 AM 10/21/2023    4:06 PM 08/23/2023   12:23 PM 07/16/2023   11:10 AM  GAD 7 : Generalized Anxiety Score  Nervous, Anxious, on Edge 0 1 1 1   Control/stop worrying 0 0 2 1  Worry too much - different things 0 0 2 1  Trouble relaxing 0 0 1 2  Restless 0 1 1 2   Easily annoyed or irritable 0 0 1 1  Afraid - awful might happen 0 0 2 1  Total GAD 7 Score 0 2 10 9   Anxiety Difficulty Not difficult at all Somewhat difficult Somewhat difficult Somewhat difficult     Assessment/ Plan: Rane Dumm here for annual physical exam.   Annual physical exam - Plan: Cytology - PAP  Screening for malignant neoplasm of cervix  Centrilobular emphysema (HCC) - Plan: albuterol (VENTOLIN HFA) 108 (90 Base) MCG/ACT inhaler  Age-related osteoporosis without current pathological fracture  Restless legs - Plan: rOPINIRole (REQUIP) 3 MG tablet  Moderate episode of recurrent major depressive disorder (HCC) - Plan: DULoxetine (CYMBALTA) 60 MG capsule  Nausea - Plan: ondansetron (ZOFRAN-ODT) 4 MG disintegrating tablet  Encounter for Prevnar pneumococcal vaccination - Plan: Pneumococcal conjugate vaccine 20-valent (Prevnar 20)  Encounter for immunization - Plan: Flu vaccine trivalent PF, 6mos and older(Flulaval,Afluria,Fluarix,Fluzone)  Pap smear completed.  No further needed as she has no  uterus  Globally decreased breath sounds with expiratory wheezes noted on exam.  Continued follow-up with pulmonology for ongoing management  Continue Fosamax.  She is doing well on this medicine with no red flag signs or symptoms  Requip renewed for restless leg  Albuterol renewed.  Cymbalta renewed.  Zofran sent.  Use only if needed.  Discussed potential for tardive dyskinesia with overuse.  Continue to follow-up with GI if continued to have chronic nausea  Vaccinations administered today  Counseled on healthy lifestyle choices, including diet (rich in fruits, vegetables and lean meats and low in salt and simple carbohydrates) and exercise (at least 30 minutes of moderate physical activity daily).  Patient to follow up 1 year  Bellamy Judson M. Nadine Counts, DO

## 2024-01-17 NOTE — Patient Instructions (Signed)

## 2024-01-20 ENCOUNTER — Other Ambulatory Visit: Payer: Self-pay | Admitting: Family Medicine

## 2024-01-20 DIAGNOSIS — R928 Other abnormal and inconclusive findings on diagnostic imaging of breast: Secondary | ICD-10-CM

## 2024-01-21 ENCOUNTER — Ambulatory Visit: Admitting: Pulmonary Disease

## 2024-01-23 LAB — CYTOLOGY - PAP
Comment: NEGATIVE
Diagnosis: UNDETERMINED — AB
High risk HPV: POSITIVE — AB

## 2024-01-24 ENCOUNTER — Encounter: Payer: Self-pay | Admitting: Family Medicine

## 2024-01-28 DIAGNOSIS — H43393 Other vitreous opacities, bilateral: Secondary | ICD-10-CM | POA: Diagnosis not present

## 2024-01-28 DIAGNOSIS — H524 Presbyopia: Secondary | ICD-10-CM | POA: Diagnosis not present

## 2024-01-28 DIAGNOSIS — Z0100 Encounter for examination of eyes and vision without abnormal findings: Secondary | ICD-10-CM | POA: Diagnosis not present

## 2024-01-29 ENCOUNTER — Other Ambulatory Visit: Payer: Self-pay | Admitting: Adult Health

## 2024-01-29 DIAGNOSIS — F410 Panic disorder [episodic paroxysmal anxiety] without agoraphobia: Secondary | ICD-10-CM

## 2024-01-29 DIAGNOSIS — F411 Generalized anxiety disorder: Secondary | ICD-10-CM

## 2024-02-04 ENCOUNTER — Encounter

## 2024-02-04 ENCOUNTER — Telehealth (INDEPENDENT_AMBULATORY_CARE_PROVIDER_SITE_OTHER): Payer: Medicare HMO | Admitting: Adult Health

## 2024-02-04 ENCOUNTER — Ambulatory Visit
Admission: RE | Admit: 2024-02-04 | Discharge: 2024-02-04 | Disposition: A | Discharge status: Home / Self Care | Source: Ambulatory Visit | Attending: Family Medicine | Admitting: Family Medicine

## 2024-02-04 ENCOUNTER — Ambulatory Visit

## 2024-02-04 ENCOUNTER — Encounter: Payer: Self-pay | Admitting: Adult Health

## 2024-02-04 DIAGNOSIS — F410 Panic disorder [episodic paroxysmal anxiety] without agoraphobia: Secondary | ICD-10-CM

## 2024-02-04 DIAGNOSIS — R928 Other abnormal and inconclusive findings on diagnostic imaging of breast: Secondary | ICD-10-CM

## 2024-02-04 DIAGNOSIS — F4310 Post-traumatic stress disorder, unspecified: Secondary | ICD-10-CM | POA: Diagnosis not present

## 2024-02-04 DIAGNOSIS — F331 Major depressive disorder, recurrent, moderate: Secondary | ICD-10-CM

## 2024-02-04 DIAGNOSIS — R921 Mammographic calcification found on diagnostic imaging of breast: Secondary | ICD-10-CM | POA: Diagnosis not present

## 2024-02-04 DIAGNOSIS — F411 Generalized anxiety disorder: Secondary | ICD-10-CM

## 2024-02-04 MED ORDER — ALPRAZOLAM 0.5 MG PO TABS: ORAL_TABLET | ORAL | 2 refills | Status: DC

## 2024-02-04 MED ORDER — QUETIAPINE FUMARATE 200 MG PO TABS: 200.0000 mg | ORAL_TABLET | Freq: Every day | ORAL | 1 refills | Status: DC

## 2024-02-04 NOTE — Progress Notes (Signed)
 Alicia Sanchez [id number] 12/11/1960 63 y.o.  Virtual Visit via Video Note  I connected with pt @ on 02/04/24 at  2:30 PM EDT by a video enabled telemedicine application and verified that I am speaking with the correct person using two identifiers.   I discussed the limitations of evaluation and management by telemedicine and the availability of in person appointments. The patient expressed understanding and agreed to proceed.  I discussed the assessment and treatment plan with the patient. The patient was provided an opportunity to ask questions and all were answered. The patient agreed with the plan and demonstrated an understanding of the instructions.   The patient was advised to call back or seek an in-person evaluation if the symptoms worsen or if the condition fails to improve as anticipated.  I provided 25 minutes of non-face-to-face time during this encounter.  The patient was located at home.  The provider was located at [facility].   Dorothyann Gibbs, NP   Subjective:   Patient ID:  Alicia Sanchez is a 63 y.o. (DOB 16-Jun-1961) female.  Chief Complaint: No chief complaint on file.   HPI Lekha Dancer presents for follow-up of GAD, MDD, PTSD, insomnia and panic attacks.  Describes mood today as "ok". Pleasant. Tearful. Mood symptoms - reports depression, anxiety and irritability. Reports stable interest and motivation. Reports panic attacks. Reports worry, rumination and over thinking. Mood is more consistent. Stating "I'm not feeling too good". Taking medication as prescribed.                                                                            Energy levels lower. Active, does not have a regular exercise routine.   Enjoys some usual interests and activities. Married, but legally separated. Has two daughters and 2 step daughters. Has 3 sisters and 2 brothers. Mother living - talks to her. Spending time with family. Involved in church. Appetite adequate.  Weight gain - 116 from 113 pounds. Sleeping better some nights than others. Averages 8 or more hours. Reports daytime napping. Reports focus and concentration difficulties - starting and stopping tasks - not completing things. Managing aspects of household. Disabled since 2020.  Denies SI or HI.  Denies AH or VH. Denies self harm. Denies substance use.  Previous medication trials: Paxil, Wellbutrin, Depakote, Trazadone and others   Review of Systems:  Review of Systems  Musculoskeletal:  Negative for gait problem.  Neurological:  Negative for tremors.  Psychiatric/Behavioral:         Please refer to HPI    Medications: I have reviewed the patient's current medications.  Current Outpatient Medications  Medication Sig Dispense Refill   acetaminophen (TYLENOL) 500 MG tablet Take 1,000 mg by mouth every 6 (six) hours as needed for moderate pain.     albuterol (VENTOLIN HFA) 108 (90 Base) MCG/ACT inhaler Inhale 2 puffs into the lungs every 6 (six) hours as needed for wheezing or shortness of breath. 51 each 3   alendronate (FOSAMAX) 70 MG tablet TAKE 1 TABLET EVERY 7 DAYS WITH A FULL GLASS OF WATER ON AN EMPTY STOMACH 12 tablet 3   ALPRAZolam (XANAX) 0.5 MG tablet Take 1 tablet by mouth twice daily 60 tablet 0  aspirin EC 81 MG tablet Take 81 mg by mouth daily.     atorvastatin (LIPITOR) 40 MG tablet TAKE 1 TABLET EVERY DAY 90 tablet 3   Budeson-Glycopyrrol-Formoterol (BREZTRI AEROSPHERE) 160-9-4.8 MCG/ACT AERO Inhale 2 puffs into the lungs 2 (two) times daily. 32.1 g 5   Calcium-Magnesium-Zinc (CAL-MAG-ZINC PO) Take 1 tablet by mouth daily.     cetirizine (ZYRTEC) 10 MG tablet Take 10 mg by mouth daily.     Cholecalciferol (VITAMIN D3 MAXIMUM STRENGTH) 125 MCG (5000 UT) capsule Take 5,000 Units by mouth daily.     DULoxetine (CYMBALTA) 60 MG capsule Take 2 capsules (120 mg total) by mouth daily. 180 capsule 3   ipratropium-albuterol (DUONEB) 0.5-2.5 (3) MG/3ML SOLN INHALE THE CONTENTS  OF 1 VIAL VIA NEBULIZER EVERY 6 HOURS AS NEEDED (Patient taking differently: Take 3 mLs by nebulization every 4 (four) hours as needed (Shortness of breath).) 360 mL 11   meloxicam (MOBIC) 7.5 MG tablet Take 1 tablet (7.5 mg total) by mouth daily. 90 tablet 3   metoprolol tartrate (LOPRESSOR) 25 MG tablet Take 0.5 tablets (12.5 mg total) by mouth 2 (two) times daily as needed (palpitations). 30 tablet 2   midodrine (PROAMATINE) 2.5 MG tablet Take 2.5 mg by mouth 3 (three) times daily as needed (bp below 90/60).     nitroGLYCERIN (NITROSTAT) 0.4 MG SL tablet Place 1 tablet (0.4 mg total) under the tongue every 5 (five) minutes x 3 doses as needed for chest pain (if o relief after 2nd dose, proceed to the ED for an evaluation or call 911). 25 tablet 2   omeprazole (PRILOSEC) 40 MG capsule Take 1 capsule (40 mg total) by mouth daily. 30 capsule 3   ondansetron (ZOFRAN-ODT) 4 MG disintegrating tablet Take 1 tablet (4 mg total) by mouth every 8 (eight) hours as needed for nausea or vomiting. 20 tablet 0   QUEtiapine (SEROQUEL) 200 MG tablet Take 1 tablet (200 mg total) by mouth at bedtime. 30 tablet 2   Respiratory Therapy Supplies (NEBULIZER/TUBING/MOUTHPIECE) KIT 1 Units by Does not apply route 4 (four) times daily. GIVE face mask and tubing for nebulizer. J44.1 1 kit 0   rOPINIRole (REQUIP) 3 MG tablet Take 1 tablet (3 mg total) by mouth at bedtime. 90 tablet 3   No current facility-administered medications for this visit.    Medication Side Effects: None  Allergies:  Allergies  Allergen Reactions   Doxycycline     Chest pain    Past Medical History:  Diagnosis Date   Anxiety    Aortic atherosclerosis (HCC)    Chronic kidney disease    COPD (chronic obstructive pulmonary disease) (HCC)    Depression    Headache    History of chest pain    History of syncope    Hypotension    Osteoporosis    Palpitations    Panic attacks    Pneumonia    Restless legs syndrome (RLS)     Family  History  Problem Relation Age of Onset   COPD Mother    COPD Father    Diabetes type II Sister    Kidney failure Sister    Diabetes type II Brother    COPD Maternal Grandfather    Breast cancer Neg Hx    Liver disease Neg Hx    Esophageal cancer Neg Hx    Colon cancer Neg Hx     Social History   Socioeconomic History   Marital status: Married    Spouse  name: Not on file   Number of children: 2   Years of education: Not on file   Highest education level: GED or equivalent  Occupational History   Not on file  Tobacco Use   Smoking status: Every Day    Current packs/day: 0.50    Average packs/day: 0.5 packs/day for 42.0 years (21.0 ttl pk-yrs)    Types: Cigarettes   Smokeless tobacco: Never   Tobacco comments:    Smoking 4-5 cigarettes.  11/28/2023 hfb  Vaping Use   Vaping status: Never Used  Substance and Sexual Activity   Alcohol use: Yes    Comment: soical   Drug use: Yes    Frequency: 1.0 times per week    Types: Marijuana   Sexual activity: Yes    Birth control/protection: Surgical  Other Topics Concern   Not on file  Social History Narrative   Not on file   Social Drivers of Health   Financial Resource Strain: Medium Risk (01/16/2024)   Overall Financial Resource Strain (CARDIA)    Difficulty of Paying Living Expenses: Somewhat hard  Food Insecurity: Food Insecurity Present (01/16/2024)   Hunger Vital Sign    Worried About Running Out of Food in the Last Year: Sometimes true    Ran Out of Food in the Last Year: Often true  Transportation Needs: No Transportation Needs (01/16/2024)   PRAPARE - Administrator, Civil Service (Medical): No    Lack of Transportation (Non-Medical): No  Physical Activity: Inactive (01/16/2024)   Exercise Vital Sign    Days of Exercise per Week: 0 days    Minutes of Exercise per Session: 0 min  Stress: No Stress Concern Present (01/16/2024)   [employer] of Occupational Health - Occupational Stress Questionnaire     Feeling of Stress : Only a little  Social Connections: Moderately Isolated (01/16/2024)   Social Connection and Isolation Panel [NHANES]    Frequency of Communication with Friends and Family: More than three times a week    Frequency of Social Gatherings with Friends and Family: Once a week    Attends Religious Services: Never    Database administrator or Organizations: Yes    Attends Banker Meetings: Never    Marital Status: Separated  Intimate Partner Violence: Not At Risk (12/23/2023)   Humiliation, Afraid, Rape, and Kick questionnaire    Fear of Current or Ex-Partner: No    Emotionally Abused: No    Physically Abused: No    Sexually Abused: No    Past Medical History, Surgical history, Social history, and Family history were reviewed and updated as appropriate.   Please see review of systems for further details on the patient's review from today.   Objective:   Physical Exam:  There were no vitals taken for this visit.  Physical Exam Constitutional:      General: She is not in acute distress. Musculoskeletal:        General: No deformity.  Neurological:     Mental Status: She is alert and oriented to person, place, and time.     Coordination: Coordination normal.  Psychiatric:        Attention and Perception: Attention and perception normal. She does not perceive auditory or visual hallucinations.        Mood and Affect: Affect is not labile, blunt, angry or inappropriate.        Speech: Speech normal.        Behavior: Behavior normal.  Thought Content: Thought content normal. Thought content is not paranoid or delusional. Thought content does not include homicidal or suicidal ideation. Thought content does not include homicidal or suicidal plan.        Cognition and Memory: Cognition and memory normal.        Judgment: Judgment normal.     Comments: Insight intact     Lab Review:     Component Value Date/Time   NA 142 03/25/2023 1144   K 4.6  03/25/2023 1144   CL 102 03/25/2023 1144   CO2 23 03/25/2023 1144   GLUCOSE 67 (L) 03/25/2023 1144   GLUCOSE 110 (H) 03/13/2023 0351   BUN 12 03/25/2023 1144   CREATININE 0.87 03/25/2023 1144   CALCIUM 9.1 03/25/2023 1144   PROT 6.6 03/12/2023 0330   PROT 6.9 02/04/2023 1456   ALBUMIN 3.4 (L) 03/12/2023 0330   ALBUMIN 4.6 02/04/2023 1456   AST 25 03/12/2023 0330   ALT 15 03/12/2023 0330   ALKPHOS 57 03/12/2023 0330   BILITOT 0.6 03/12/2023 0330   BILITOT 0.7 02/04/2023 1456   GFRNONAA >60 03/13/2023 0351   GFRAA 91 06/03/2020 1505       Component Value Date/Time   WBC 9.0 03/25/2023 1144   WBC 9.6 03/14/2023 0518   RBC 4.94 03/25/2023 1144   RBC 4.31 03/14/2023 0518   HGB 15.3 03/25/2023 1144   HCT 45.1 03/25/2023 1144   PLT 374 03/25/2023 1144   MCV 91 03/25/2023 1144   MCH 31.0 03/25/2023 1144   MCH 30.9 03/14/2023 0518   MCHC 33.9 03/25/2023 1144   MCHC 33.4 03/14/2023 0518   RDW 12.8 03/25/2023 1144   LYMPHSABS 2.8 02/04/2023 1456   MONOABS 0.5 05/25/2018 0602   EOSABS 0.0 02/04/2023 1456   BASOSABS 0.1 02/04/2023 1456    No results found for: "POCLITH", "LITHIUM"   No results found for: "PHENYTOIN", "PHENOBARB", "VALPROATE", "CBMZ"   .res Assessment: Plan:    Plan:  PDMP reviewed  Increase Xanax 0.5mg  twice daily to four times daily.  Seroquel 200mg  at hs  PCP: Cymbalta 120mg  every morning  Refer to therapist  RTC 3 months  Patient advised to contact office with any questions, adverse effects, or acute worsening in signs and symptoms.  Discussed potential benefits, risk, and side effects of benzodiazepines to include potential risk of tolerance and dependence, as well as possible drowsiness. Advised patient not to drive if experiencing drowsiness and to take lowest possible effective dose to minimize risk of dependence and tolerance.  There are no diagnoses linked to this encounter.   Please see After Visit Summary for patient specific  instructions.  Future Appointments  Date Time Provider Department Center  12/23/2024 10:40 AM [facility]-ANNUAL WELLNESS VISIT [facility] None    No orders of the defined types were placed in this encounter.     -------------------------------

## 2024-02-09 DIAGNOSIS — J449 Chronic obstructive pulmonary disease, unspecified: Secondary | ICD-10-CM | POA: Diagnosis not present

## 2024-02-09 DIAGNOSIS — R062 Wheezing: Secondary | ICD-10-CM | POA: Diagnosis not present

## 2024-02-11 MED ORDER — BREZTRI AEROSPHERE 160-9-4.8 MCG/ACT IN AERO: 2.0000 | INHALATION_SPRAY | Freq: Two times a day (BID) | RESPIRATORY_TRACT | 5 refills | Status: DC

## 2024-02-11 NOTE — Telephone Encounter (Signed)
 Done  Meds ordered this encounter  Medications   budeson-glycopyrrolate-formoterol (BREZTRI AEROSPHERE) 160-9-4.8 MCG/ACT AERO    Sig: Inhale 2 puffs into the lungs 2 (two) times daily.    Dispense:  32.1 g    Refill:  5

## 2024-02-12 ENCOUNTER — Other Ambulatory Visit: Payer: Self-pay | Admitting: Adult Health

## 2024-02-12 DIAGNOSIS — F411 Generalized anxiety disorder: Secondary | ICD-10-CM

## 2024-02-12 DIAGNOSIS — F410 Panic disorder [episodic paroxysmal anxiety] without agoraphobia: Secondary | ICD-10-CM

## 2024-03-06 NOTE — Progress Notes (Signed)
 Pharmacy Medication Assistance Program Note    03/06/2024  Patient ID: Alicia Sanchez, female   DOB: Feb 19, 1961, 63 y.o.   MRN: [id number]     01/07/2024  Outreach [facility]  Manufacturer [facility]ication [facility] Nurse, Adult Drugs [facility]  Type of Radiographer, Therapeutic Assistance  Date Application Sent to Patient 01/14/2024  Application Items Requested Application  Date Application Received From Patient 02/11/2024  Application Items Received From Patient Application  Patient Assistance Determination Approved  Approval End Date 11/18/2024     RENEWAL APPROVED

## 2024-03-11 DIAGNOSIS — R062 Wheezing: Secondary | ICD-10-CM | POA: Diagnosis not present

## 2024-03-11 DIAGNOSIS — J449 Chronic obstructive pulmonary disease, unspecified: Secondary | ICD-10-CM | POA: Diagnosis not present

## 2024-03-28 ENCOUNTER — Other Ambulatory Visit: Payer: Self-pay | Admitting: Adult Health

## 2024-03-28 DIAGNOSIS — F410 Panic disorder [episodic paroxysmal anxiety] without agoraphobia: Secondary | ICD-10-CM

## 2024-03-28 DIAGNOSIS — F411 Generalized anxiety disorder: Secondary | ICD-10-CM

## 2024-04-10 DIAGNOSIS — R062 Wheezing: Secondary | ICD-10-CM | POA: Diagnosis not present

## 2024-04-10 DIAGNOSIS — J449 Chronic obstructive pulmonary disease, unspecified: Secondary | ICD-10-CM | POA: Diagnosis not present

## 2024-04-16 ENCOUNTER — Ambulatory Visit: Payer: Self-pay

## 2024-04-16 NOTE — Telephone Encounter (Signed)
 Copied from CRM [id number]. Topic: Clinical - Red Word Triage >> Apr 16, 2024 10:23 AM Loreda Rodriguez T wrote: Red Word that prompted transfer to Nurse Triage: coughing up yellow mucus, chills, fever and shortness of breath.    Chief Complaint: Cough with yellow mucus, COVID negative, SOB, wheezing Symptoms: above Frequency: This week Pertinent Negatives: Patient denies fever Disposition: [] ED /[] Urgent Care (no appt availability in office) / [x] Appointment(In office/virtual)/ []  [facility]/ [] Home Care/ [] Refused Recommended Disposition /[] [facility]/ []  Follow-up with PCP Additional Notes: agrees with appointment, will go to UC for worsening of symptoms.  Reason for Disposition  [1] MILD difficulty breathing (e.g., minimal/no SOB at rest, SOB with walking, pulse <100) AND [2] still present when not coughing  Answer Assessment - Initial Assessment Questions 1. ONSET: "When did the cough begin?"      Last weekend 2. SEVERITY: "How bad is the cough today?"      severe 3. SPUTUM: "Describe the color of your sputum" (none, dry cough; clear, white, yellow, green)     yellow 4. HEMOPTYSIS: "Are you coughing up any blood?" If so ask: "How much?" (flecks, streaks, tablespoons, etc.)     no 5. DIFFICULTY BREATHING: "Are you having difficulty breathing?" If Yes, ask: "How bad is it?" (e.g., mild, moderate, severe)    - MILD: No SOB at rest, mild SOB with walking, speaks normally in sentences, can lie down, no retractions, pulse < 100.    - MODERATE: SOB at rest, SOB with minimal exertion and prefers to sit, cannot lie down flat, speaks in phrases, mild retractions, audible wheezing, pulse 100-120.    - SEVERE: Very SOB at rest, speaks in single words, struggling to breathe, sitting hunched forward, retractions, pulse > 120      mild 6. FEVER: "Do you have a fever?" If Yes, ask: "What is your temperature, how was it measured, and when did it start?"     chills 7. CARDIAC HISTORY:  "Do you have any history of heart disease?" (e.g., heart attack, congestive heart failure)      no 8. LUNG HISTORY: "Do you have any history of lung disease?"  (e.g., pulmonary embolus, asthma, emphysema)     COPD 9. PE RISK FACTORS: "Do you have a history of blood clots?" (or: recent major surgery, recent prolonged travel, bedridden)     NO 10. OTHER SYMPTOMS: "Do you have any other symptoms?" (e.g., runny nose, wheezing, chest pain)       wheezing 11. PREGNANCY: "Is there any chance you are pregnant?" "When was your last menstrual period?"       no 12. TRAVEL: "Have you traveled out of the country in the last month?" (e.g., travel history, exposures)       no  Protocols used: Cough - Acute Productive-A-AH

## 2024-04-17 ENCOUNTER — Encounter: Payer: Self-pay | Admitting: Nurse Practitioner

## 2024-04-17 ENCOUNTER — Ambulatory Visit: Admitting: Nurse Practitioner

## 2024-04-17 VITALS — BP 99/64 | HR 126 | Temp 98.0°F | Ht 64.0 in | Wt 110.0 lb

## 2024-04-17 DIAGNOSIS — J441 Chronic obstructive pulmonary disease with (acute) exacerbation: Secondary | ICD-10-CM | POA: Diagnosis not present

## 2024-04-17 MED ORDER — PREDNISONE 20 MG PO TABS: ORAL_TABLET | ORAL | 0 refills | Status: DC

## 2024-04-17 MED ORDER — AZITHROMYCIN 250 MG PO TABS: ORAL_TABLET | ORAL | 0 refills | Status: DC

## 2024-04-17 MED ORDER — METHYLPREDNISOLONE ACETATE 80 MG/ML IJ SUSP
80.0000 mg | Freq: Once | INTRAMUSCULAR | Status: AC
Start: 2024-04-17 — End: 2024-04-17
  Administered 2024-04-17: 80 mg via INTRAMUSCULAR

## 2024-04-17 NOTE — Progress Notes (Signed)
 Subjective:    Patient ID: Alicia Sanchez, female    DOB: 19-Apr-1961, 63 y.o.   MRN: [id number]   Chief Complaint: Cough and Shortness of Breath   Cough This is a recurrent problem. The current episode started in the past 7 days. The problem has been waxing and waning. The cough is Productive of sputum. Associated symptoms include a fever (100.7), shortness of breath and wheezing. Pertinent negatives include no ear congestion. Nothing aggravates the symptoms. Treatments tried: nebulizer. The treatment provided mild relief.    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   Other spondylosis with radiculopathy, cervical region 12/21/2018   Chronic left shoulder pain 11/24/2018   Arthritis 11/24/2018   Stress incontinence of urine 08/12/2018   Actinic keratosis 07/17/2018   Chronic respiratory failure with hypoxia (HCC) 07/16/2018   Abnormal findings on diagnostic imaging of lung 06/18/2018   Lung nodules 08/21/2017   GOLD COPD II D 08/21/2017   Recurrent UTI 08/21/2017   Generalized anxiety disorder 03/22/2017   Moderate episode of recurrent major depressive disorder (HCC) 12/16/2016   Restless leg syndrome 12/16/2016   OAB (overactive bladder) 12/16/2016   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  HENT:  Positive for congestion.   Respiratory:  Positive for cough, shortness of breath and wheezing.        Objective:   Physical Exam Constitutional:      Appearance: Normal appearance.  HENT:     Right Ear: Tympanic membrane normal.     Left Ear: Tympanic membrane normal.     Nose: Congestion present.      Mouth/Throat:     Pharynx: No oropharyngeal exudate or posterior oropharyngeal erythema.  Cardiovascular:     Rate and Rhythm: Normal rate and regular rhythm.     Heart sounds: Normal heart sounds.  Pulmonary:     Breath sounds: Wheezing (insp and exp throughout) present.     Comments: Pursed lip breathing Skin:    General: Skin is warm.  Neurological:     General: No focal deficit present.     Mental Status: She is alert and oriented to person, place, and time.  Psychiatric:        Mood and Affect: Mood normal.        Behavior: Behavior normal.     BP 99/64   Pulse (!) 126   Temp 98 F (36.7 C) (Temporal)   Ht 5\' 4"  (1.626 m)   Wt 110 lb (49.9 kg)   SpO2 92%   BMI 18.88 kg/m        Assessment & Plan:   Alicia Sanchez in today with chief complaint of Cough and Shortness of Breath   1. COPD with acute exacerbation (HCC) (Primary) Rest Nebulizer as needed Try to stop smoking - methylPREDNISolone  acetate (DEPO-MEDROL ) injection 80 mg - predniSONE  (DELTASONE ) 20 MG tablet; 2 po at sametime daily for 5 days-  Dispense: 10 tablet; Refill: 0 - azithromycin  (ZITHROMAX  Z-PAK) 250 MG tablet; As directed  Dispense: 6 tablet; Refill: 0    The above assessment and management plan was discussed with  the patient. The patient verbalized understanding of and has agreed to the management plan. Patient is aware to call the clinic if symptoms persist or worsen. Patient is aware when to return to the clinic for a follow-up visit. Patient educated on when it is appropriate to go to the emergency department.   Mary-Margaret Gaylyn Keas, FNP

## 2024-04-17 NOTE — Patient Instructions (Signed)

## 2024-04-18 ENCOUNTER — Encounter (HOSPITAL_COMMUNITY): Payer: Self-pay

## 2024-04-18 ENCOUNTER — Inpatient Hospital Stay (HOSPITAL_COMMUNITY)
Admission: EM | Admit: 2024-04-18 | Discharge: 2024-04-21 | DRG: 189 | Disposition: A | Discharge status: Home / Self Care | Attending: Internal Medicine | Admitting: Internal Medicine

## 2024-04-18 ENCOUNTER — Emergency Department (HOSPITAL_COMMUNITY)

## 2024-04-18 ENCOUNTER — Other Ambulatory Visit: Payer: Self-pay

## 2024-04-18 DIAGNOSIS — Z881 Allergy status to other antibiotic agents status: Secondary | ICD-10-CM

## 2024-04-18 DIAGNOSIS — Z7951 Long term (current) use of inhaled steroids: Secondary | ICD-10-CM

## 2024-04-18 DIAGNOSIS — Z720 Tobacco use: Secondary | ICD-10-CM | POA: Diagnosis not present

## 2024-04-18 DIAGNOSIS — I3139 Other pericardial effusion (noninflammatory): Secondary | ICD-10-CM | POA: Diagnosis not present

## 2024-04-18 DIAGNOSIS — F410 Panic disorder [episodic paroxysmal anxiety] without agoraphobia: Secondary | ICD-10-CM | POA: Diagnosis not present

## 2024-04-18 DIAGNOSIS — J440 Chronic obstructive pulmonary disease with acute lower respiratory infection: Secondary | ICD-10-CM | POA: Diagnosis not present

## 2024-04-18 DIAGNOSIS — F32A Depression, unspecified: Secondary | ICD-10-CM | POA: Diagnosis not present

## 2024-04-18 DIAGNOSIS — G8929 Other chronic pain: Secondary | ICD-10-CM | POA: Diagnosis present

## 2024-04-18 DIAGNOSIS — Z825 Family history of asthma and other chronic lower respiratory diseases: Secondary | ICD-10-CM | POA: Diagnosis not present

## 2024-04-18 DIAGNOSIS — Z833 Family history of diabetes mellitus: Secondary | ICD-10-CM

## 2024-04-18 DIAGNOSIS — Z7983 Long term (current) use of bisphosphonates: Secondary | ICD-10-CM

## 2024-04-18 DIAGNOSIS — Z1152 Encounter for screening for COVID-19: Secondary | ICD-10-CM | POA: Diagnosis not present

## 2024-04-18 DIAGNOSIS — Z791 Long term (current) use of non-steroidal anti-inflammatories (NSAID): Secondary | ICD-10-CM

## 2024-04-18 DIAGNOSIS — E782 Mixed hyperlipidemia: Secondary | ICD-10-CM | POA: Diagnosis present

## 2024-04-18 DIAGNOSIS — M810 Age-related osteoporosis without current pathological fracture: Secondary | ICD-10-CM | POA: Diagnosis not present

## 2024-04-18 DIAGNOSIS — Z8701 Personal history of pneumonia (recurrent): Secondary | ICD-10-CM | POA: Diagnosis not present

## 2024-04-18 DIAGNOSIS — R6510 Systemic inflammatory response syndrome (SIRS) of non-infectious origin without acute organ dysfunction: Secondary | ICD-10-CM | POA: Insufficient documentation

## 2024-04-18 DIAGNOSIS — R109 Unspecified abdominal pain: Secondary | ICD-10-CM | POA: Diagnosis not present

## 2024-04-18 DIAGNOSIS — J9601 Acute respiratory failure with hypoxia: Secondary | ICD-10-CM | POA: Diagnosis not present

## 2024-04-18 DIAGNOSIS — Z90710 Acquired absence of both cervix and uterus: Secondary | ICD-10-CM

## 2024-04-18 DIAGNOSIS — J439 Emphysema, unspecified: Secondary | ICD-10-CM | POA: Diagnosis not present

## 2024-04-18 DIAGNOSIS — I700 Atherosclerosis of aorta: Secondary | ICD-10-CM | POA: Diagnosis present

## 2024-04-18 DIAGNOSIS — J441 Chronic obstructive pulmonary disease with (acute) exacerbation: Secondary | ICD-10-CM | POA: Diagnosis not present

## 2024-04-18 DIAGNOSIS — R7401 Elevation of levels of liver transaminase levels: Secondary | ICD-10-CM | POA: Diagnosis present

## 2024-04-18 DIAGNOSIS — J189 Pneumonia, unspecified organism: Secondary | ICD-10-CM

## 2024-04-18 DIAGNOSIS — R000 Tachycardia, unspecified: Secondary | ICD-10-CM | POA: Diagnosis not present

## 2024-04-18 DIAGNOSIS — J9622 Acute and chronic respiratory failure with hypercapnia: Secondary | ICD-10-CM | POA: Diagnosis present

## 2024-04-18 DIAGNOSIS — Z7982 Long term (current) use of aspirin: Secondary | ICD-10-CM | POA: Diagnosis not present

## 2024-04-18 DIAGNOSIS — F419 Anxiety disorder, unspecified: Secondary | ICD-10-CM | POA: Diagnosis not present

## 2024-04-18 DIAGNOSIS — J9621 Acute and chronic respiratory failure with hypoxia: Secondary | ICD-10-CM | POA: Diagnosis not present

## 2024-04-18 DIAGNOSIS — I2510 Atherosclerotic heart disease of native coronary artery without angina pectoris: Secondary | ICD-10-CM | POA: Diagnosis present

## 2024-04-18 DIAGNOSIS — G2581 Restless legs syndrome: Secondary | ICD-10-CM | POA: Diagnosis present

## 2024-04-18 DIAGNOSIS — R0602 Shortness of breath: Secondary | ICD-10-CM | POA: Diagnosis not present

## 2024-04-18 DIAGNOSIS — R079 Chest pain, unspecified: Secondary | ICD-10-CM | POA: Diagnosis not present

## 2024-04-18 DIAGNOSIS — F17210 Nicotine dependence, cigarettes, uncomplicated: Secondary | ICD-10-CM | POA: Diagnosis present

## 2024-04-18 DIAGNOSIS — R918 Other nonspecific abnormal finding of lung field: Secondary | ICD-10-CM | POA: Diagnosis not present

## 2024-04-18 DIAGNOSIS — Z79899 Other long term (current) drug therapy: Secondary | ICD-10-CM

## 2024-04-18 LAB — RESPIRATORY PANEL BY PCR

## 2024-04-18 LAB — BLOOD GAS, VENOUS
Acid-Base Excess: 4.5 mmol/L — ABNORMAL HIGH (ref 0.0–2.0)
Bicarbonate: 30.4 mmol/L — ABNORMAL HIGH (ref 20.0–28.0)
Drawn by: 27160
O2 Saturation: 31.5 %
Patient temperature: 37.5
pCO2, Ven: 50 mmHg (ref 44–60)
pH, Ven: 7.39 (ref 7.25–7.43)
pO2, Ven: <31 mmHg — CL (ref 32–45)

## 2024-04-18 LAB — COMPREHENSIVE METABOLIC PANEL WITH GFR
ALT: 58 U/L — ABNORMAL HIGH (ref 0–44)
AST: 68 U/L — ABNORMAL HIGH (ref 15–41)
Albumin: 3.6 g/dL (ref 3.5–5.0)
Alkaline Phosphatase: 70 U/L (ref 38–126)
Anion gap: 13 (ref 5–15)
BUN: 12 mg/dL (ref 8–23)
CO2: 23 mmol/L (ref 22–32)
Calcium: 9.1 mg/dL (ref 8.9–10.3)
Chloride: 97 mmol/L — ABNORMAL LOW (ref 98–111)
Creatinine, Ser: 0.8 mg/dL (ref 0.44–1.00)
GFR, Estimated: >60 mL/min (ref >60)
Glucose, Bld: 127 mg/dL — ABNORMAL HIGH (ref 70–99)
Potassium: 4 mmol/L (ref 3.5–5.1)
Sodium: 133 mmol/L — ABNORMAL LOW (ref 135–145)
Total Bilirubin: 1 mg/dL (ref 0.0–1.2)
Total Protein: 7.3 g/dL (ref 6.5–8.1)

## 2024-04-18 LAB — PROTIME-INR
INR: 1 (ref 0.8–1.2)
Prothrombin Time: 13.2 s (ref 11.4–15.2)

## 2024-04-18 LAB — CBC
HCT: 43.2 % (ref 36.0–46.0)
Hemoglobin: 14.9 g/dL (ref 12.0–15.0)
MCH: 30.9 pg (ref 26.0–34.0)
MCHC: 34.5 g/dL (ref 30.0–36.0)
MCV: 89.6 fL (ref 80.0–100.0)
Platelets: 283 10*3/uL (ref 150–400)
RBC: 4.82 MIL/uL (ref 3.87–5.11)
RDW: 12.2 % (ref 11.5–15.5)
WBC: 22.3 10*3/uL — ABNORMAL HIGH (ref 4.0–10.5)
nRBC: 0 % (ref 0.0–0.2)

## 2024-04-18 LAB — PROCALCITONIN: Procalcitonin: <0.1 ng/mL

## 2024-04-18 LAB — RESP PANEL BY RT-PCR (RSV, FLU A&B, COVID)  RVPGX2
Influenza A by PCR: NEGATIVE
Influenza B by PCR: NEGATIVE
Resp Syncytial Virus by PCR: NEGATIVE
SARS Coronavirus 2 by RT PCR: NEGATIVE

## 2024-04-18 LAB — LACTIC ACID, PLASMA
Lactic Acid, Venous: 1.4 mmol/L (ref 0.5–1.9)
Lactic Acid, Venous: 1.1 mmol/L (ref 0.5–1.9)

## 2024-04-18 LAB — TROPONIN I (HIGH SENSITIVITY)
Troponin I (High Sensitivity): 7 ng/L (ref <18)
Troponin I (High Sensitivity): 5 ng/L (ref <18)

## 2024-04-18 LAB — D-DIMER, QUANTITATIVE: D-Dimer, Quant: 3.44 ug{FEU}/mL — ABNORMAL HIGH (ref 0.00–0.50)

## 2024-04-18 LAB — HIV ANTIBODY (ROUTINE TESTING W REFLEX): HIV Screen 4th Generation wRfx: NONREACTIVE

## 2024-04-18 MED ORDER — DULOXETINE HCL 60 MG PO CPEP
120.0000 mg | ORAL_CAPSULE | Freq: Every day | ORAL | Status: DC
  Administered 2024-04-19 – 2024-04-21 (×3): 120 mg via ORAL
  Filled 2024-04-18 (×3): qty 2

## 2024-04-18 MED ORDER — LACTATED RINGERS IV SOLN: INTRAVENOUS | Status: DC

## 2024-04-18 MED ORDER — ARFORMOTEROL TARTRATE 15 MCG/2ML IN NEBU
15.0000 ug | INHALATION_SOLUTION | Freq: Two times a day (BID) | RESPIRATORY_TRACT | Status: DC
  Administered 2024-04-18 – 2024-04-21 (×6): 15 ug via RESPIRATORY_TRACT
  Filled 2024-04-18 (×6): qty 2

## 2024-04-18 MED ORDER — QUETIAPINE FUMARATE 100 MG PO TABS
200.0000 mg | ORAL_TABLET | Freq: Every day | ORAL | Status: DC
  Administered 2024-04-18 – 2024-04-20 (×3): 200 mg via ORAL
  Filled 2024-04-18 (×3): qty 2

## 2024-04-18 MED ORDER — ATORVASTATIN CALCIUM 40 MG PO TABS
40.0000 mg | ORAL_TABLET | Freq: Every day | ORAL | Status: DC
  Administered 2024-04-18 – 2024-04-19 (×2): 40 mg via ORAL
  Filled 2024-04-18 (×3): qty 1

## 2024-04-18 MED ORDER — LACTATED RINGERS IV SOLN: INTRAVENOUS | Status: AC

## 2024-04-18 MED ORDER — SODIUM CHLORIDE 0.9 % IV SOLN
500.0000 mg | Freq: Once | INTRAVENOUS | Status: AC
  Administered 2024-04-18: 500 mg via INTRAVENOUS
  Filled 2024-04-18: qty 5

## 2024-04-18 MED ORDER — ONDANSETRON HCL 4 MG/2ML IJ SOLN: 4.0000 mg | Freq: Four times a day (QID) | INTRAMUSCULAR | Status: DC | PRN

## 2024-04-18 MED ORDER — IPRATROPIUM-ALBUTEROL 0.5-2.5 (3) MG/3ML IN SOLN: 3.0000 mL | Freq: Once | RESPIRATORY_TRACT | Status: DC

## 2024-04-18 MED ORDER — SODIUM CHLORIDE 0.9 % IV SOLN
1.0000 g | INTRAVENOUS | Status: DC
  Administered 2024-04-19 – 2024-04-21 (×3): 1 g via INTRAVENOUS
  Filled 2024-04-18 (×3): qty 10

## 2024-04-18 MED ORDER — LACTATED RINGERS IV BOLUS
1000.0000 mL | Freq: Once | INTRAVENOUS | Status: AC
  Administered 2024-04-18: 1000 mL via INTRAVENOUS

## 2024-04-18 MED ORDER — ALPRAZOLAM 0.25 MG PO TABS
0.2500 mg | ORAL_TABLET | Freq: Three times a day (TID) | ORAL | Status: DC
  Administered 2024-04-18 – 2024-04-21 (×10): 0.25 mg via ORAL
  Filled 2024-04-18 (×10): qty 1

## 2024-04-18 MED ORDER — PANTOPRAZOLE SODIUM 40 MG PO TBEC
40.0000 mg | DELAYED_RELEASE_TABLET | Freq: Every day | ORAL | Status: DC
  Administered 2024-04-18 – 2024-04-21 (×4): 40 mg via ORAL
  Filled 2024-04-18 (×4): qty 1

## 2024-04-18 MED ORDER — SODIUM CHLORIDE 0.9 % IV SOLN
2.0000 g | Freq: Once | INTRAVENOUS | Status: AC
  Administered 2024-04-18: 2 g via INTRAVENOUS
  Filled 2024-04-18: qty 20

## 2024-04-18 MED ORDER — ACETAMINOPHEN 650 MG RE SUPP: 650.0000 mg | Freq: Four times a day (QID) | RECTAL | Status: DC | PRN

## 2024-04-18 MED ORDER — BUDESONIDE 0.5 MG/2ML IN SUSP
0.5000 mg | Freq: Two times a day (BID) | RESPIRATORY_TRACT | Status: DC
  Administered 2024-04-18 – 2024-04-21 (×6): 0.5 mg via RESPIRATORY_TRACT
  Filled 2024-04-18 (×6): qty 2

## 2024-04-18 MED ORDER — SODIUM CHLORIDE 0.9 % IV SOLN
500.0000 mg | Freq: Once | INTRAVENOUS | Status: DC
  Filled 2024-04-18: qty 5

## 2024-04-18 MED ORDER — ASPIRIN 81 MG PO TBEC
81.0000 mg | DELAYED_RELEASE_TABLET | Freq: Every day | ORAL | Status: DC
  Administered 2024-04-18 – 2024-04-21 (×4): 81 mg via ORAL
  Filled 2024-04-18 (×4): qty 1

## 2024-04-18 MED ORDER — METHYLPREDNISOLONE SODIUM SUCC 40 MG IJ SOLR
40.0000 mg | Freq: Two times a day (BID) | INTRAMUSCULAR | Status: DC
  Administered 2024-04-18 – 2024-04-21 (×6): 40 mg via INTRAVENOUS
  Filled 2024-04-18 (×7): qty 1

## 2024-04-18 MED ORDER — ROPINIROLE HCL 1 MG PO TABS
3.0000 mg | ORAL_TABLET | Freq: Every day | ORAL | Status: DC
  Administered 2024-04-18 – 2024-04-20 (×3): 3 mg via ORAL
  Filled 2024-04-18 (×3): qty 3

## 2024-04-18 MED ORDER — LORATADINE 10 MG PO TABS
10.0000 mg | ORAL_TABLET | Freq: Every day | ORAL | Status: DC
  Administered 2024-04-19 – 2024-04-21 (×3): 10 mg via ORAL
  Filled 2024-04-18 (×3): qty 1

## 2024-04-18 MED ORDER — ONDANSETRON HCL 4 MG PO TABS: 4.0000 mg | ORAL_TABLET | Freq: Four times a day (QID) | ORAL | Status: DC | PRN

## 2024-04-18 MED ORDER — IPRATROPIUM-ALBUTEROL 0.5-2.5 (3) MG/3ML IN SOLN
3.0000 mL | Freq: Four times a day (QID) | RESPIRATORY_TRACT | Status: DC
  Administered 2024-04-18 – 2024-04-20 (×6): 3 mL via RESPIRATORY_TRACT
  Filled 2024-04-18 (×6): qty 3

## 2024-04-18 MED ORDER — ACETAMINOPHEN 325 MG PO TABS
650.0000 mg | ORAL_TABLET | Freq: Four times a day (QID) | ORAL | Status: DC | PRN
  Administered 2024-04-18 – 2024-04-19 (×2): 650 mg via ORAL
  Filled 2024-04-18 (×2): qty 2

## 2024-04-18 MED ORDER — METHYLPREDNISOLONE SODIUM SUCC 125 MG IJ SOLR
125.0000 mg | Freq: Once | INTRAMUSCULAR | Status: AC
  Administered 2024-04-18: 125 mg via INTRAVENOUS
  Filled 2024-04-18: qty 2

## 2024-04-18 MED ORDER — AZITHROMYCIN 250 MG PO TABS
500.0000 mg | ORAL_TABLET | Freq: Every day | ORAL | Status: DC
  Administered 2024-04-19 – 2024-04-21 (×3): 500 mg via ORAL
  Filled 2024-04-18 (×3): qty 2

## 2024-04-18 MED ORDER — ENOXAPARIN SODIUM 40 MG/0.4ML IJ SOSY
40.0000 mg | PREFILLED_SYRINGE | INTRAMUSCULAR | Status: DC
  Administered 2024-04-18 – 2024-04-20 (×3): 40 mg via SUBCUTANEOUS
  Filled 2024-04-18 (×3): qty 0.4

## 2024-04-18 NOTE — Hospital Course (Signed)
 Female with a history of COPD, tobacco abuse, nonobstructive CAD, anxiety/depression, hyperlipidemia, restless leg syndrome presenting for day history of worsening shortness of breath and left-sided chest pain.  The patient states that she has chronic chest pain which is usually worsened when her breathing is worse.  She has a worsening cough with yellow-green sputum.  She denies any hemoptysis.  She had a fever up to 100.7 F on 04/17/2024.  She denies any vomiting but has some nausea.  She denies any diarrhea, hematochezia, melena.  There is no dysuria or hematuria.  She continues to smoke about 1/2 pack/day.  She has an over 40-pack-year history of tobacco.  She has been using her nebulizer machine at home which she states helps a bit. She went to see her outpatient provider on 04/17/2024.  She was given IM injection of Depo-Medrol  and azithromycin  which she has not yet started.  Unfortunately, shortness of breath worsened overnight.  As a result she presented for further evaluation and treatment. In the ED, the patient had a low-grade temperature of 99.5 F.  She was tachycardic low 110s, but hemodynamically stable.  Oxygen  saturation was 88% on room air.  On 2 L, saturation is 97%.  WBC 22.3, hemoglobin 14.3, platelets 283.  Sodium 133, potassium 4.0, bicarbonate 23, serum creatinine 0.80.  AST 68, ALT 50, alk phosphatase 70, total bilirubin 1.0.  The patient was started on ceftriaxone and azithromycin .

## 2024-04-18 NOTE — Plan of Care (Signed)

## 2024-04-18 NOTE — ED Notes (Signed)
 Orders to cancel 2nd lactic

## 2024-04-18 NOTE — ED Notes (Signed)
 Family will take w/c back to their car.

## 2024-04-18 NOTE — ED Triage Notes (Signed)
 Pt takes midodrine  at home and has a wheelchair at bedside. Pt has been using her oxygen  at home for the past few days.

## 2024-04-18 NOTE — ED Provider Notes (Signed)
 [place] EMERGENCY DEPARTMENT AT [facility] Provider Note   CSN: [id number] Arrival date & time: 04/18/24  7829     History  Chief Complaint  Patient presents with   Shortness of Breath   Chest Pain    Alicia Sanchez is a 63 y.o. female.  HPI    63 year old female comes in with chief complaint of chest pain, shortness of breath.  Patient has history of tobacco use disorder, COPD on as needed oxygen .   Patient indicates that she has been feeling more shortness of breath over the last 4 days.  Patient has also noted that her cough is worse, and now she is producing green phlegm.  She is having low-grade fever and there is mild left-sided chest discomfort with the cough.  She has been taking her nebulizer treatment, it provides her transient relief.  She saw her PCP recently and was started on azithromycin  and prednisone .  Patient normally needs oxygen  as needed.  She has been using oxygen  constantly at 2 L for the last few days.  No history of PE, DVT.  Patient's pain is left-sided.  She denies any history of coronary artery disease.  Home Medications Prior to Admission medications   Medication Sig Start Date End Date Taking? Authorizing Provider  acetaminophen  (TYLENOL ) 500 MG tablet Take 1,000 mg by mouth every 6 (six) hours as needed for moderate pain.    [provider]  albuterol  (VENTOLIN  HFA) 108 (90 Base) MCG/ACT inhaler Inhale 2 puffs into the lungs every 6 (six) hours as needed for wheezing or shortness of breath. 01/17/24   Eliodoro Guerin, DO  alendronate  (FOSAMAX ) 70 MG tablet TAKE 1 TABLET EVERY 7 DAYS WITH A FULL GLASS OF WATER ON AN EMPTY STOMACH 01/17/24   Vicky Grange M, DO  ALPRAZolam  (XANAX ) 0.5 MG tablet Take one tablet four times daily. 02/04/24   Mozingo, Regina Nattalie, NP  aspirin  EC 81 MG tablet Take 81 mg by mouth daily.    [provider]  atorvastatin  (LIPITOR) 40 MG tablet TAKE 1 TABLET EVERY DAY 01/17/24    Vicky Grange M, DO  azithromycin  (ZITHROMAX  Z-PAK) 250 MG tablet As directed 04/17/24   Delfina Feller, FNP  budeson-glycopyrrolate-formoterol  (BREZTRI  AEROSPHERE) 160-9-4.8 MCG/ACT AERO Inhale 2 puffs into the lungs 2 (two) times daily. 02/11/24   Eliodoro Guerin, DO  Calcium -Magnesium -Zinc (CAL-MAG-ZINC PO) Take 1 tablet by mouth daily.    [provider]  cetirizine  (ZYRTEC ) 10 MG tablet Take 10 mg by mouth daily. 11/20/23   [provider]  Cholecalciferol (VITAMIN D3 MAXIMUM STRENGTH) 125 MCG (5000 UT) capsule Take 5,000 Units by mouth daily.    [provider]  DULoxetine  (CYMBALTA ) 60 MG capsule Take 2 capsules (120 mg total) by mouth daily. 01/17/24   Eliodoro Guerin, DO  ipratropium-albuterol  (DUONEB) 0.5-2.5 (3) MG/3ML SOLN INHALE THE CONTENTS OF 1 VIAL VIA NEBULIZER EVERY 6 HOURS AS NEEDED Patient taking differently: Take 3 mLs by nebulization every 4 (four) hours as needed (Shortness of breath). 03/01/23   Mannam, Praveen, MD  meloxicam  (MOBIC ) 7.5 MG tablet Take 1 tablet (7.5 mg total) by mouth daily. 03/15/22   Eliodoro Guerin, DO  metoprolol  tartrate (LOPRESSOR ) 25 MG tablet Take 0.5 tablets (12.5 mg total) by mouth 2 (two) times daily as needed (palpitations). 03/26/23 12/23/23  Lasalle Pointer, NP  midodrine  (PROAMATINE ) 2.5 MG tablet Take 2.5 mg by mouth 3 (three) times daily as needed (bp below 90/60).    [provider]  nitroGLYCERIN  (NITROSTAT ) 0.4 MG SL tablet Place 1 tablet (0.4 mg total) under the tongue every 5 (five) minutes x 3 doses as needed for chest pain (if o relief after 2nd dose, proceed to the ED for an evaluation or call 911). 01/28/23   Lasalle Pointer, NP  omeprazole  (PRILOSEC) 40 MG capsule Take 1 capsule (40 mg total) by mouth daily. 11/04/23   Albertina Hugger, MD  ondansetron  (ZOFRAN -ODT) 4 MG disintegrating tablet Take 1 tablet (4 mg total) by mouth every 8 (eight) hours as needed for nausea or vomiting.  01/17/24   Vicky Grange M, DO  predniSONE  (DELTASONE ) 20 MG tablet 2 po at sametime daily for 5 days- 04/17/24   Delfina Feller, FNP  QUEtiapine  (SEROQUEL ) 200 MG tablet Take 1 tablet (200 mg total) by mouth at bedtime. 02/04/24   Mozingo, Regina Nattalie, NP  Respiratory Therapy Supplies (NEBULIZER/TUBING/MOUTHPIECE) KIT 1 Units by Does not apply route 4 (four) times daily. GIVE face mask and tubing for nebulizer. J44.1 11/21/21   Eliodoro Guerin, DO  rOPINIRole  (REQUIP ) 3 MG tablet Take 1 tablet (3 mg total) by mouth at bedtime. 01/17/24   Eliodoro Guerin, DO      Allergies    Doxycycline     Review of Systems   Review of Systems  All other systems reviewed and are negative.   Physical Exam Updated Vital Signs BP 118/73   Pulse 97   Temp 99.5 F (37.5 C) (Oral)   Resp (!) 26   Ht 5\' 4"  (1.626 m)   Wt 49.9 kg   SpO2 95%   BMI 18.88 kg/m  Physical Exam Vitals and nursing note reviewed.  Constitutional:      Appearance: She is well-developed.  HENT:     Head: Atraumatic.  Cardiovascular:     Rate and Rhythm: Normal rate.  Pulmonary:     Effort: Pulmonary effort is normal.     Breath sounds: No decreased breath sounds, wheezing, rhonchi or rales.  Musculoskeletal:     Cervical back: Normal range of motion and neck supple.     Right lower leg: No edema.     Left lower leg: No edema.  Skin:    General: Skin is warm and dry.  Neurological:     Mental Status: She is alert and oriented to person, place, and time.     ED Results / Procedures / Treatments   Labs (all labs ordered are listed, but only abnormal results are displayed) Labs Reviewed  CBC - Abnormal; Notable for the following components:      Result Value   WBC 22.3 (*)    All other components within normal limits  COMPREHENSIVE METABOLIC PANEL WITH GFR - Abnormal; Notable for the following components:   Sodium 133 (*)    Chloride 97 (*)    Glucose, Bld 127 (*)    AST 68 (*)    ALT 58 (*)     All other components within normal limits  RESP PANEL BY RT-PCR (RSV, FLU A&B, COVID)  RVPGX2  CULTURE, BLOOD (ROUTINE X 2)  CULTURE, BLOOD (ROUTINE X 2)  LACTIC ACID, PLASMA  PROTIME-INR  LACTIC ACID, PLASMA  TROPONIN I (HIGH SENSITIVITY)  TROPONIN I (HIGH SENSITIVITY)    EKG EKG Interpretation Date/Time:  Saturday Apr 18 2024 07:37:59 EDT Ventricular Rate:  119 PR Interval:  117 QRS Duration:  86 QT Interval:  324 QTC Calculation: 456 R Axis:   112  Text Interpretation:  Sinus tachycardia Paired ventricular premature complexes Aberrant complex Right atrial enlargement Right axis deviation Low voltage, precordial leads Probable anterolateral infarct, old Minimal ST depression, inferior leads Confirmed by Deatra Face [phone]) on 04/18/2024 9:13:54 AM  Radiology DG Chest Port 1 View Result Date: 04/18/2024 CLINICAL DATA:  SOB chronic cough EXAM: PORTABLE CHEST - 1 VIEW COMPARISON:  10/21/2023. FINDINGS: The heart size and mediastinal contours are within normal limits. Lungs are hyperinflated suggesting COPD. There is no focal consolidation. No pneumothorax or pleural effusion. The visualized skeletal structures are unremarkable. IMPRESSION: Findings suggest COPD.  Otherwise no acute cardiopulmonary disease. Electronically Signed   By: Sydell Eva M.D.   On: 04/18/2024 08:47    Procedures Procedures    Medications Ordered in ED Medications  lactated ringers  infusion ( Intravenous New Bag/Given 04/18/24 0931)  cefTRIAXone (ROCEPHIN) 2 g in sodium chloride  0.9 % 100 mL IVPB (2 g Intravenous New Bag/Given 04/18/24 0929)  azithromycin  (ZITHROMAX ) 500 mg in sodium chloride  0.9 % 250 mL IVPB (has no administration in time range)  ipratropium-albuterol  (DUONEB) 0.5-2.5 (3) MG/3ML nebulizer solution 3 mL (has no administration in time range)    ED Course/ Medical Decision Making/ A&P                                 Medical Decision Making Amount and/or Complexity of Data  Reviewed Labs: ordered. Radiology: ordered.  Risk Prescription drug management. Decision regarding hospitalization.   This patient presents to the ED with chief complaint(s) of worsening cough, shortness of breath with acute on chronic hypoxia, now requiring continuous oxygen  instead of as needed with pertinent past medical history of COPD, tobacco use disorder, currently on prednisone  and azithromycin .The complaint involves an extensive differential diagnosis and also carries with it a high risk of complications and morbidity.    The differential diagnosis includes : COPD exacerbation, acute on chronic respiratory failure due to COPD exacerbation, pneumonia, pleural effusion, pulmonary edema, pulmonary embolism, acute coronary syndrome.  Patient noted to be tachycardic and slightly tachypneic. She is reporting subjective fevers, phlegm with yellow-green output. Sepsis workup initiated given questionable/evolving sepsis possibility.  The initial plan is to get basic labs, give patient DuoNeb.  Get a chest x-ray. On exam, there is no respiratory distress or significant wheezing.  1 round of DuoNeb to be given.  PE considered in the differential diagnosis, but clinically this seems like pneumonia picture and COPD exacerbation picture.  No signs of DVT on exam.  She is high risk for pulmonary hypertension and PE as a result of it as she is fairly sedentary.  If troponins are normal, then I think we will not pursue PE workup in the ER.   Additional history obtained: Additional history obtained from family Records reviewed previous admission documents and primary care notes. I do not see any CT PE in our system. Last echo revealed no evidence of pulmonary hypertension.  Independent labs interpretation:  The following labs were independently interpreted: CBC is showing elevated white count.  Could be reactive because of prednisone .  Metabolic profile is normal.  Flu/COVID is negative.   Lactic is normal.  Troponin is 7. We will not pursue CT PE at this time.  Independent visualization and interpretation of imaging: - I independently visualized the following imaging with scope of interpretation limited to determining acute life threatening conditions related to emergency care: X-ray of the chest, which revealed no evidence of focal consolidation.  Treatment and Reassessment: Patient made aware that we will admit her to the hospital. Likely pneumonia, COPD exacerbation.  Final Clinical Impression(s) / ED Diagnoses Final diagnoses:  COPD with acute exacerbation (HCC)  Community acquired pneumonia, unspecified laterality    Rx / DC Orders ED Discharge Orders     None         Deatra Face, MD 04/18/24 [id number]

## 2024-04-18 NOTE — Progress Notes (Signed)
   04/18/24 1841  TOC Brief Assessment  Insurance and Status Reviewed  Patient has primary care physician Yes  Home environment has been reviewed Single family home  Prior level of function: Independent  Prior/Current Home Services No current home services  Social Drivers of Health Review SDOH reviewed no interventions necessary  Transition of care needs no transition of care needs at this time   Admitted Acute respiratory failure with hypoxia (HCC) . No TOC needs at this time. TOC will follow.

## 2024-04-18 NOTE — ED Triage Notes (Signed)
 Pt c/o SOB, CP , n/v and fever of 100.7 x 3 days. Pt does have oxygen  at home PRN.

## 2024-04-18 NOTE — H&P (Signed)
 History and Physical    Patient: Alicia Sanchez YQM:[id number] DOB: 1961-10-17 DOA: 04/18/2024 DOS: the patient was seen and examined on 04/18/2024 PCP: Eliodoro Guerin, DO  Patient coming from: Home  Chief Complaint:  Chief Complaint  Patient presents with   Shortness of Breath   Chest Pain   HPI: Alicia Sanchez is a Female with a history of COPD, tobacco abuse, nonobstructive CAD, anxiety/depression, hyperlipidemia, restless leg syndrome presenting for day history of worsening shortness of breath and left-sided chest pain.  The patient states that she has chronic chest pain which is usually worsened when her breathing is worse.  She has a worsening cough with yellow-green sputum.  She denies any hemoptysis.  She had a fever up to 100.7 F on 04/17/2024.  She denies any vomiting but has some nausea.  She denies any diarrhea, hematochezia, melena.  There is no dysuria or hematuria.  She continues to smoke about 1/2 pack/day.  She has an over 40-pack-year history of tobacco.  She has been using her nebulizer machine at home which she states helps a bit. She went to see her outpatient provider on 04/17/2024.  She was given IM injection of Depo-Medrol  and azithromycin  which she has not yet started.  Unfortunately, shortness of breath worsened overnight.  As a result she presented for further evaluation and treatment. In the ED, the patient had a low-grade temperature of 99.5 F.  She was tachycardic low 110s, but hemodynamically stable.  Oxygen  saturation was 88% on room air.  On 2 L, saturation is 97%.  WBC 22.3, hemoglobin 14.3, platelets 283.  Sodium 133, potassium 4.0, bicarbonate 23, serum creatinine 0.80.  AST 68, ALT 50, alk phosphatase 70, total bilirubin 1.0.  The patient was started on ceftriaxone and azithromycin .  Review of Systems: As mentioned in the history of present illness. All other systems reviewed and are negative. Past Medical History:  Diagnosis Date   Anxiety    Aortic  atherosclerosis (HCC)    Chronic kidney disease    COPD (chronic obstructive pulmonary disease) (HCC)    Depression    Headache    History of chest pain    History of syncope    Hypotension    Osteoporosis    Palpitations    Panic attacks    Pneumonia    Restless legs syndrome (RLS)    Past Surgical History:  Procedure Laterality Date   ABDOMINAL HYSTERECTOMY     BIOPSY  05/20/2023   Procedure: BIOPSY;  Surgeon: Albertina Hugger, MD;  Location: [facility];  Service: Gastroenterology;;   COLONOSCOPY WITH PROPOFOL  N/A 05/20/2023   Procedure: COLONOSCOPY WITH PROPOFOL ;  Surgeon: Albertina Hugger, MD;  Location: [facility];  Service: Gastroenterology;  Laterality: N/A;   ESOPHAGOGASTRODUODENOSCOPY (EGD) WITH PROPOFOL  N/A 05/20/2023   Procedure: ESOPHAGOGASTRODUODENOSCOPY (EGD) WITH PROPOFOL ;  Surgeon: Albertina Hugger, MD;  Location: [facility];  Service: Gastroenterology;  Laterality: N/A;   Social History:  reports that she has been smoking cigarettes. She has a 21 pack-year smoking history. She has never used smokeless tobacco. She reports current alcohol use. She reports current drug use. Frequency: 1.00 time per week. Drug: Marijuana.  Allergies  Allergen Reactions   Doxycycline      Chest pain    Family History  Problem Relation Age of Onset   COPD Mother    COPD Father    Diabetes type II Sister    Kidney failure Sister    Diabetes type II Brother  COPD Maternal Grandfather    Breast cancer Neg Hx    Liver disease Neg Hx    Esophageal cancer Neg Hx    Colon cancer Neg Hx     Prior to Admission medications   Medication Sig Start Date End Date Taking? Authorizing Provider  acetaminophen  (TYLENOL ) 500 MG tablet Take 1,000 mg by mouth every 6 (six) hours as needed for moderate pain.    [provider]  albuterol  (VENTOLIN  HFA) 108 (90 Base) MCG/ACT inhaler Inhale 2 puffs into the lungs every 6 (six) hours as needed for wheezing or shortness of breath.  01/17/24   Eliodoro Guerin, DO  alendronate  (FOSAMAX ) 70 MG tablet TAKE 1 TABLET EVERY 7 DAYS WITH A FULL GLASS OF WATER ON AN EMPTY STOMACH 01/17/24   Vicky Grange M, DO  ALPRAZolam  (XANAX ) 0.5 MG tablet Take one tablet four times daily. 02/04/24   Mozingo, Regina Nattalie, NP  aspirin  EC 81 MG tablet Take 81 mg by mouth daily.    [provider]  atorvastatin  (LIPITOR) 40 MG tablet TAKE 1 TABLET EVERY DAY 01/17/24   Vicky Grange M, DO  azithromycin  (ZITHROMAX  Z-PAK) 250 MG tablet As directed 04/17/24   Delfina Feller, FNP  budeson-glycopyrrolate-formoterol  (BREZTRI  AEROSPHERE) 160-9-4.8 MCG/ACT AERO Inhale 2 puffs into the lungs 2 (two) times daily. 02/11/24   Eliodoro Guerin, DO  Calcium -Magnesium -Zinc (CAL-MAG-ZINC PO) Take 1 tablet by mouth daily.    [provider]  cetirizine  (ZYRTEC ) 10 MG tablet Take 10 mg by mouth daily. 11/20/23   [provider]  Cholecalciferol (VITAMIN D3 MAXIMUM STRENGTH) 125 MCG (5000 UT) capsule Take 5,000 Units by mouth daily.    [provider]  DULoxetine  (CYMBALTA ) 60 MG capsule Take 2 capsules (120 mg total) by mouth daily. 01/17/24   Eliodoro Guerin, DO  ipratropium-albuterol  (DUONEB) 0.5-2.5 (3) MG/3ML SOLN INHALE THE CONTENTS OF 1 VIAL VIA NEBULIZER EVERY 6 HOURS AS NEEDED Patient taking differently: Take 3 mLs by nebulization every 4 (four) hours as needed (Shortness of breath). 03/01/23   Mannam, Praveen, MD  meloxicam  (MOBIC ) 7.5 MG tablet Take 1 tablet (7.5 mg total) by mouth daily. 03/15/22   Eliodoro Guerin, DO  metoprolol  tartrate (LOPRESSOR ) 25 MG tablet Take 0.5 tablets (12.5 mg total) by mouth 2 (two) times daily as needed (palpitations). 03/26/23 12/23/23  Lasalle Pointer, NP  midodrine  (PROAMATINE ) 2.5 MG tablet Take 2.5 mg by mouth 3 (three) times daily as needed (bp below 90/60).    [provider]  nitroGLYCERIN  (NITROSTAT ) 0.4 MG SL tablet Place 1 tablet (0.4 mg total) under the  tongue every 5 (five) minutes x 3 doses as needed for chest pain (if o relief after 2nd dose, proceed to the ED for an evaluation or call 911). 01/28/23   Lasalle Pointer, NP  omeprazole  (PRILOSEC) 40 MG capsule Take 1 capsule (40 mg total) by mouth daily. 11/04/23   Albertina Hugger, MD  ondansetron  (ZOFRAN -ODT) 4 MG disintegrating tablet Take 1 tablet (4 mg total) by mouth every 8 (eight) hours as needed for nausea or vomiting. 01/17/24   Vicky Grange M, DO  predniSONE  (DELTASONE ) 20 MG tablet 2 po at sametime daily for 5 days- 04/17/24   Delfina Feller, FNP  QUEtiapine  (SEROQUEL ) 200 MG tablet Take 1 tablet (200 mg total) by mouth at bedtime. 02/04/24   Mozingo, Regina Nattalie, NP  Respiratory Therapy Supplies (NEBULIZER/TUBING/MOUTHPIECE) KIT 1 Units by Does not apply route 4 (four) times daily. GIVE face  mask and tubing for nebulizer. J44.1 11/21/21   Vicky Grange M, DO  rOPINIRole  (REQUIP ) 3 MG tablet Take 1 tablet (3 mg total) by mouth at bedtime. 01/17/24   Eliodoro Guerin, DO    Physical Exam: Vitals:   04/18/24 0815 04/18/24 0832 04/18/24 0845 04/18/24 0945  BP: 106/71 100/77 100/68 118/73  Pulse: 94 (!) 112 98 97  Resp: 18 (!) 22 (!) 23 (!) 26  Temp:      TempSrc:      SpO2: 97% 95% 96% 95%  Weight:      Height:       GENERAL:  A&O x 3, NAD, well developed, cooperative, follows commands HEENT: [place]/AT, No thrush, No icterus, No oral ulcers Neck:  No neck mass, No meningismus, soft, supple CV: RRR, no S3, no S4, no rub, no JVD Lungs: Diminished breath sounds bilateral.  Bilateral rales.  Upper airway wheezing. Abd: soft/NT +BS, nondistended Ext: No edema, no lymphangitis, no cyanosis, no rashes Neuro:  CN II-XII intact, strength 4/5 in RUE, RLE, strength 4/5 LUE, LLE; sensation intact bilateral; no dysmetria; babinski equivocal  Data Reviewed: Data reviewed above in the history  Assessment and Plan: Acute on chronic respiratory failure with hypoxia -Patient  is supposed to be on 2 L nasal cannula at home, but she does not wear it consistently. - Currently stable on 2 L - Secondary COPD exacerbation - Chest x-ray without any infiltrates or edema - COVID-19 PCR is negative  COPD exacerbation - Start DuoNebs - Start Brovana - Start Pulmicort  - Start IV Solu-Medrol  - Viral respiratory panel  Anxiety/depression - Continue home dose alprazolam , Seroquel , and Cymbalta  - PDMP reviewed - Patient receives alprazolam  0.5 mg, 120, last refill 04/01/2024  Tobacco abuse - Tobacco cessation discussed  Atypical chest pain - Troponin 7>> - Reproducible on exam - Check D-dimer  Transaminasemia - Right upper quadrant ultrasound - Trend LFTs  Mixed hyperlipidemia - Continue statin    Advance Care Planning: FULL  Consults: none  Family Communication: niece 5/31  Severity of Illness: The appropriate patient status for this patient is INPATIENT. Inpatient status is judged to be reasonable and necessary in order to provide the required intensity of service to ensure the patient's safety. The patient's presenting symptoms, physical exam findings, and initial radiographic and laboratory data in the context of their chronic comorbidities is felt to place them at high risk for further clinical deterioration. Furthermore, it is not anticipated that the patient will be medically stable for discharge from the hospital within 2 midnights of admission.   * I certify that at the point of admission it is my clinical judgment that the patient will require inpatient hospital care spanning beyond 2 midnights from the point of admission due to high intensity of service, high risk for further deterioration and high frequency of surveillance required.*  Author: Demaris Fillers, MD 04/18/2024 10:03 AM  For on call review [email or web address].

## 2024-04-18 NOTE — Sepsis Progress Note (Signed)
 Sepsis protocol monitored by eLink ?

## 2024-04-19 ENCOUNTER — Inpatient Hospital Stay (HOSPITAL_COMMUNITY)

## 2024-04-19 DIAGNOSIS — J9601 Acute respiratory failure with hypoxia: Secondary | ICD-10-CM | POA: Diagnosis not present

## 2024-04-19 DIAGNOSIS — Z720 Tobacco use: Secondary | ICD-10-CM | POA: Diagnosis not present

## 2024-04-19 DIAGNOSIS — J9621 Acute and chronic respiratory failure with hypoxia: Secondary | ICD-10-CM | POA: Diagnosis not present

## 2024-04-19 DIAGNOSIS — J441 Chronic obstructive pulmonary disease with (acute) exacerbation: Secondary | ICD-10-CM | POA: Diagnosis not present

## 2024-04-19 LAB — COMPREHENSIVE METABOLIC PANEL WITH GFR
ALT: 69 U/L — ABNORMAL HIGH (ref 0–44)
AST: 46 U/L — ABNORMAL HIGH (ref 15–41)
Albumin: 2.5 g/dL — ABNORMAL LOW (ref 3.5–5.0)
Alkaline Phosphatase: 58 U/L (ref 38–126)
Anion gap: 8 (ref 5–15)
BUN: 17 mg/dL (ref 8–23)
CO2: 25 mmol/L (ref 22–32)
Calcium: 8.4 mg/dL — ABNORMAL LOW (ref 8.9–10.3)
Chloride: 104 mmol/L (ref 98–111)
Creatinine, Ser: 0.62 mg/dL (ref 0.44–1.00)
GFR, Estimated: >60 mL/min (ref >60)
Glucose, Bld: 148 mg/dL — ABNORMAL HIGH (ref 70–99)
Potassium: 4 mmol/L (ref 3.5–5.1)
Sodium: 137 mmol/L (ref 135–145)
Total Bilirubin: 0.3 mg/dL (ref 0.0–1.2)
Total Protein: 5.7 g/dL — ABNORMAL LOW (ref 6.5–8.1)

## 2024-04-19 LAB — CBC
HCT: 34.8 % — ABNORMAL LOW (ref 36.0–46.0)
Hemoglobin: 11.6 g/dL — ABNORMAL LOW (ref 12.0–15.0)
MCH: 29.7 pg (ref 26.0–34.0)
MCHC: 33.3 g/dL (ref 30.0–36.0)
MCV: 89.2 fL (ref 80.0–100.0)
Platelets: 247 10*3/uL (ref 150–400)
RBC: 3.9 MIL/uL (ref 3.87–5.11)
RDW: 12 % (ref 11.5–15.5)
WBC: 14.1 10*3/uL — ABNORMAL HIGH (ref 4.0–10.5)
nRBC: 0 % (ref 0.0–0.2)

## 2024-04-19 MED ORDER — IOHEXOL 350 MG/ML SOLN
75.0000 mL | Freq: Once | INTRAVENOUS | Status: AC | PRN
Start: 2024-04-19 — End: 2024-04-19
  Administered 2024-04-19: 75 mL via INTRAVENOUS

## 2024-04-19 NOTE — Plan of Care (Signed)
   Problem: Activity: Goal: Risk for activity intolerance will decrease Outcome: Progressing   Problem: Coping: Goal: Level of anxiety will decrease Outcome: Progressing

## 2024-04-19 NOTE — Plan of Care (Signed)

## 2024-04-19 NOTE — Progress Notes (Signed)
 PROGRESS NOTE  Alicia Sanchez YQM:[id number] DOB: [birth year] DOA: 04/18/2024 PCP: Eliodoro Guerin, DO  Brief History:  Female with a history of COPD, tobacco abuse, nonobstructive CAD, anxiety/depression, hyperlipidemia, restless leg syndrome presenting for day history of worsening shortness of breath and left-sided chest pain.  The patient states that she has chronic chest pain which is usually worsened when her breathing is worse.  She has a worsening cough with yellow-green sputum.  She denies any hemoptysis.  She had a fever up to 100.7 F on 04/17/2024.  She denies any vomiting but has some nausea.  She denies any diarrhea, hematochezia, melena.  There is no dysuria or hematuria.  She continues to smoke about 1/2 pack/day.  She has an over 40-pack-year history of tobacco.  She has been using her nebulizer machine at home which she states helps a bit. She went to see her outpatient provider on 04/17/2024.  She was given IM injection of Depo-Medrol  and azithromycin  which she has not yet started.  Unfortunately, shortness of breath worsened overnight.  As a result she presented for further evaluation and treatment. In the ED, the patient had a low-grade temperature of 99.5 F.  She was tachycardic low 110s, but hemodynamically stable.  Oxygen  saturation was 88% on room air.  On 2 L, saturation is 97%.  WBC 22.3, hemoglobin 14.3, platelets 283.  Sodium 133, potassium 4.0, bicarbonate 23, serum creatinine 0.80.  AST 68, ALT 50, alk phosphatase 70, total bilirubin 1.0.  The patient was started on ceftriaxone and azithromycin .   Assessment/Plan: Acute on chronic respiratory failure with hypoxia -Patient is supposed to be on 2 L nasal cannula at home, but she does not wear it consistently. - Currently stable on 2 L - Secondary COPD exacerbation - Chest x-ray without any infiltrates or edema - COVID-19 PCR is negative - viral resp panel--neg   COPD exacerbation - continue DuoNebs -  continue Brovana - continue Pulmicort  - continue IV Solu-Medrol  - Viral respiratory panel--neg   Anxiety/depression - Continue home dose alprazolam , Seroquel , and Cymbalta  - PDMP reviewed - Patient receives alprazolam  0.5 mg, 120, last refill 04/01/2024   Tobacco abuse - Tobacco cessation discussed   Atypical chest pain - Troponin 7>>5 - Reproducible on exam - Check D-dimer 3.44 - CTA chest   Transaminasemia - Right upper quadrant ultrasound - Trend LFTs - viral hepatitis serologies   Mixed hyperlipidemia - Continue statin        Family Communication:  no Family at bedside  Consultants:  none  Code Status:  FULL   DVT Prophylaxis:   [facility] Lovenox    Procedures: As Listed in Progress Note Above  Antibiotics: Ceftriaxone 5/31>> Azithro 5/31>>      Subjective: Pt states she is breathing a little better but remains sob with exertion.  Cough is less productive.  Denies f/c, cp, n/v/d, abd pain  Objective: Vitals:   04/19/24 0300 04/19/24 0826 04/19/24 1439 04/19/24 1504  BP: 95/74  92/60   Pulse: 74  86   Resp: 16  20   Temp: (!) 97.4 F (36.3 C)  99 F (37.2 C)   TempSrc:   Oral   SpO2: 94% 92% 97% 94%  Weight:      Height:        Intake/Output Summary (Last 24 hours) at 04/19/2024 1744 Last data filed at 04/19/2024 0502 Gross per 24 hour  Intake 240 ml  Output --  Net 240 ml  Weight change:  Exam:  General:  Pt is alert, follows commands appropriately, not in acute distress HEENT: No icterus, No thrush, No neck mass, [place]/AT Cardiovascular: RRR, S1/S2, no rubs, no gallops Respiratory: bibasilar rales L>R.  Bibasilar wheeze Abdomen: Soft/+BS, non tender, non distended, no guarding Extremities: No edema, No lymphangitis, No petechiae, No rashes, no synovitis   Data Reviewed: I have personally reviewed following labs and imaging studies Basic Metabolic Panel: Recent Labs  Lab 04/18/24 0810 04/19/24 0346  NA 133* 137  K 4.0 4.0  CL 97*  104  CO2 23 25  GLUCOSE 127* 148*  BUN 12 17  CREATININE 0.80 0.62  CALCIUM  9.1 8.4*   Liver Function Tests: Recent Labs  Lab 04/18/24 0810 04/19/24 0346  AST 68* 46*  ALT 58* 69*  ALKPHOS 70 58  BILITOT 1.0 0.3  PROT 7.3 5.7*  ALBUMIN 3.6 2.5*   No results for input(s): "LIPASE", "AMYLASE" in the last 168 hours. No results for input(s): "AMMONIA" in the last 168 hours. Coagulation Profile: Recent Labs  Lab 04/18/24 0810  INR 1.0   CBC: Recent Labs  Lab 04/18/24 0743 04/19/24 0346  WBC 22.3* 14.1*  HGB 14.9 11.6*  HCT 43.2 34.8*  MCV 89.6 89.2  PLT 283 247   Cardiac Enzymes: No results for input(s): "CKTOTAL", "CKMB", "CKMBINDEX", "TROPONINI" in the last 168 hours. BNP: Invalid input(s): "POCBNP" CBG: No results for input(s): "GLUCAP" in the last 168 hours. HbA1C: No results for input(s): "HGBA1C" in the last 72 hours. Urine analysis:    Component Value Date/Time   COLORURINE YELLOW 05/21/2018 1906   APPEARANCEUR Clear 07/16/2023 1130   LABSPEC 1.010 05/21/2018 1906   PHURINE 7.0 05/21/2018 1906   GLUCOSEU Negative 07/16/2023 1130   HGBUR LARGE (A) 05/21/2018 1906   BILIRUBINUR Negative 07/16/2023 1130   KETONESUR NEGATIVE 05/21/2018 1906   PROTEINUR Negative 07/16/2023 1130   PROTEINUR NEGATIVE 05/21/2018 1906   UROBILINOGEN 0.2 12/12/2012 1945   NITRITE Positive (A) 07/16/2023 1130   NITRITE NEGATIVE 05/21/2018 1906   LEUKOCYTESUR Trace (A) 07/16/2023 1130   Sepsis Labs: @LABRCNTIP (procalcitonin:4,lacticidven:4) ) Recent Results (from the past 240 hours)  Resp panel by RT-PCR (RSV, Flu A&B, Covid) Anterior Nasal Swab     Status: None   Collection Time: 04/18/24  7:55 AM   Specimen: Anterior Nasal Swab  Result Value Ref Range Status   SARS Coronavirus 2 by RT PCR NEGATIVE NEGATIVE Final    Comment: (NOTE) SARS-CoV-2 target nucleic acids are NOT DETECTED.  The SARS-CoV-2 RNA is generally detectable in upper respiratory specimens during the  acute phase of infection. The lowest concentration of SARS-CoV-2 viral copies this assay can detect is 138 copies/mL. A negative result does not preclude SARS-Cov-2 infection and should not be used as the sole basis for treatment or other patient management decisions. A negative result may occur with  improper specimen collection/handling, submission of specimen other than nasopharyngeal swab, presence of viral mutation(s) within the areas targeted by this assay, and inadequate number of viral copies(<138 copies/mL). A negative result must be combined with clinical observations, patient history, and epidemiological information. The expected result is Negative.  Fact Sheet for Patients:  BloggerCourse.com  Fact Sheet for Healthcare Providers:  SeriousBroker.it  This test is no t yet approved or cleared by the [place]  FDA and  has been authorized for detection and/or diagnosis of SARS-CoV-2 by FDA under an Emergency Use Authorization (EUA). This EUA will remain  in effect (meaning this test can  be used) for the duration of the COVID-19 declaration under Section 564(b)(1) of the Act, 21 U.S.C.section 360bbb-3(b)(1), unless the authorization is terminated  or revoked sooner.       Influenza A by PCR NEGATIVE NEGATIVE Final   Influenza B by PCR NEGATIVE NEGATIVE Final    Comment: (NOTE) The Xpert Xpress SARS-CoV-2/FLU/RSV plus assay is intended as an aid in the diagnosis of influenza from Nasopharyngeal swab specimens and should not be used as a sole basis for treatment. Nasal washings and aspirates are unacceptable for Xpert Xpress SARS-CoV-2/FLU/RSV testing.  Fact Sheet for Patients: BloggerCourse.com  Fact Sheet for Healthcare Providers: SeriousBroker.it  This test is not yet approved or cleared by the [place]  FDA and has been authorized for detection and/or  diagnosis of SARS-CoV-2 by FDA under an Emergency Use Authorization (EUA). This EUA will remain in effect (meaning this test can be used) for the duration of the COVID-19 declaration under Section 564(b)(1) of the Act, 21 U.S.C. section 360bbb-3(b)(1), unless the authorization is terminated or revoked.     Resp Syncytial Virus by PCR NEGATIVE NEGATIVE Final    Comment: (NOTE) Fact Sheet for Patients: BloggerCourse.com  Fact Sheet for Healthcare Providers: SeriousBroker.it  This test is not yet approved or cleared by the [place]  FDA and has been authorized for detection and/or diagnosis of SARS-CoV-2 by FDA under an Emergency Use Authorization (EUA). This EUA will remain in effect (meaning this test can be used) for the duration of the COVID-19 declaration under Section 564(b)(1) of the Act, 21 U.S.C. section 360bbb-3(b)(1), unless the authorization is terminated or revoked.  Performed at [facility], [address] [place], [place] [address]   Blood Culture (routine x 2)     Status: None (Preliminary result)   Collection Time: 04/18/24  8:10 AM   Specimen: BLOOD  Result Value Ref Range Status   Specimen Description BLOOD BLOOD RIGHT FOREARM  Final   Special Requests   Final    BOTTLES DRAWN AEROBIC AND ANAEROBIC Blood Culture results may not be optimal due to an inadequate volume of blood received in culture bottles   Culture   Final    NO GROWTH 1 DAY Performed at [facility], [address] [place], [place] [address]    Report Status PENDING  Incomplete  Blood Culture (routine x 2)     Status: None (Preliminary result)   Collection Time: 04/18/24  8:15 AM   Specimen: BLOOD  Result Value Ref Range Status   Specimen Description BLOOD BLOOD RIGHT FOREARM  Final   Special Requests   Final    BOTTLES DRAWN AEROBIC AND ANAEROBIC Blood Culture results may not be optimal due to an inadequate volume of blood received in  culture bottles   Culture   Final    NO GROWTH 1 DAY Performed at [facility], [address] [place], [place] [address]    Report Status PENDING  Incomplete  Respiratory (~20 pathogens) panel by PCR     Status: None   Collection Time: 04/18/24 10:59 AM   Specimen: Nasopharyngeal Swab; Respiratory  Result Value Ref Range Status   Adenovirus NOT DETECTED NOT DETECTED Final   Coronavirus 229E NOT DETECTED NOT DETECTED Final    Comment: (NOTE) The Coronavirus on the Respiratory Panel, DOES NOT test for the novel  Coronavirus (2019 nCoV)    Coronavirus HKU1 NOT DETECTED NOT DETECTED Final   Coronavirus NL63 NOT DETECTED NOT DETECTED Final   Coronavirus OC43 NOT DETECTED NOT DETECTED  Final   Metapneumovirus NOT DETECTED NOT DETECTED Final   Rhinovirus / Enterovirus NOT DETECTED NOT DETECTED Final   Influenza A NOT DETECTED NOT DETECTED Final   Influenza B NOT DETECTED NOT DETECTED Final   Parainfluenza Virus 1 NOT DETECTED NOT DETECTED Final   Parainfluenza Virus 2 NOT DETECTED NOT DETECTED Final   Parainfluenza Virus 3 NOT DETECTED NOT DETECTED Final   Parainfluenza Virus 4 NOT DETECTED NOT DETECTED Final   Respiratory Syncytial Virus NOT DETECTED NOT DETECTED Final   Bordetella pertussis NOT DETECTED NOT DETECTED Final   Bordetella Parapertussis NOT DETECTED NOT DETECTED Final   Chlamydophila pneumoniae NOT DETECTED NOT DETECTED Final   Mycoplasma pneumoniae NOT DETECTED NOT DETECTED Final    Comment: Performed at [facility], [address] [place], [place] [address]     Scheduled Meds:  ALPRAZolam   0.25 mg Oral TID   arformoterol  15 mcg Nebulization BID   aspirin  EC  81 mg Oral Daily   atorvastatin   40 mg Oral Daily   azithromycin   500 mg Oral Daily   budesonide  (PULMICORT ) nebulizer solution  0.5 mg Nebulization BID   DULoxetine   120 mg Oral Daily   enoxaparin  (LOVENOX ) injection  40 mg Subcutaneous Q24H   ipratropium-albuterol   3 mL Nebulization Once    ipratropium-albuterol   3 mL Nebulization Q6H   loratadine  10 mg Oral Daily   methylPREDNISolone  (SOLU-MEDROL ) injection  40 mg Intravenous Q12H   pantoprazole   40 mg Oral Daily   QUEtiapine   200 mg Oral QHS   rOPINIRole   3 mg Oral QHS   Continuous Infusions:  cefTRIAXone (ROCEPHIN)  IV 1 g (04/19/24 0908)    Procedures/Studies: DG Chest Port 1 View Result Date: 04/18/2024 CLINICAL DATA:  SOB chronic cough EXAM: PORTABLE CHEST - 1 VIEW COMPARISON:  10/21/2023. FINDINGS: The heart size and mediastinal contours are within normal limits. Lungs are hyperinflated suggesting COPD. There is no focal consolidation. No pneumothorax or pleural effusion. The visualized skeletal structures are unremarkable. IMPRESSION: Findings suggest COPD.  Otherwise no acute cardiopulmonary disease. Electronically Signed   By: Sydell Eva M.D.   On: 04/18/2024 08:47    Demaris Fillers, DO  [facility]  If 7PM-7AM, please contact night-coverage [email or web address] Password [email or web address] 04/19/2024, 5:44 PM   LOS: 1 day

## 2024-04-20 ENCOUNTER — Other Ambulatory Visit (HOSPITAL_COMMUNITY): Payer: Self-pay | Admitting: *Deleted

## 2024-04-20 ENCOUNTER — Inpatient Hospital Stay (HOSPITAL_COMMUNITY)

## 2024-04-20 ENCOUNTER — Encounter (HOSPITAL_COMMUNITY): Payer: Self-pay | Admitting: Internal Medicine

## 2024-04-20 DIAGNOSIS — J9621 Acute and chronic respiratory failure with hypoxia: Secondary | ICD-10-CM | POA: Diagnosis not present

## 2024-04-20 DIAGNOSIS — I3139 Other pericardial effusion (noninflammatory): Secondary | ICD-10-CM | POA: Diagnosis not present

## 2024-04-20 DIAGNOSIS — R079 Chest pain, unspecified: Secondary | ICD-10-CM | POA: Diagnosis not present

## 2024-04-20 DIAGNOSIS — J9601 Acute respiratory failure with hypoxia: Secondary | ICD-10-CM | POA: Diagnosis not present

## 2024-04-20 DIAGNOSIS — Z720 Tobacco use: Secondary | ICD-10-CM | POA: Diagnosis not present

## 2024-04-20 DIAGNOSIS — R6510 Systemic inflammatory response syndrome (SIRS) of non-infectious origin without acute organ dysfunction: Secondary | ICD-10-CM | POA: Diagnosis not present

## 2024-04-20 DIAGNOSIS — J441 Chronic obstructive pulmonary disease with (acute) exacerbation: Secondary | ICD-10-CM | POA: Diagnosis not present

## 2024-04-20 LAB — PHOSPHORUS: Phosphorus: 2.7 mg/dL (ref 2.5–4.6)

## 2024-04-20 LAB — BASIC METABOLIC PANEL WITH GFR
Anion gap: 7 (ref 5–15)
BUN: 13 mg/dL (ref 8–23)
CO2: 24 mmol/L (ref 22–32)
Calcium: 8.2 mg/dL — ABNORMAL LOW (ref 8.9–10.3)
Chloride: 107 mmol/L (ref 98–111)
Creatinine, Ser: 0.66 mg/dL (ref 0.44–1.00)
GFR, Estimated: >60 mL/min (ref >60)
Glucose, Bld: 152 mg/dL — ABNORMAL HIGH (ref 70–99)
Potassium: 4 mmol/L (ref 3.5–5.1)
Sodium: 138 mmol/L (ref 135–145)

## 2024-04-20 LAB — ECHOCARDIOGRAM COMPLETE
AR max vel: 2.02 cm2
AV Area VTI: 2 cm2
AV Area mean vel: 2.03 cm2
AV Mean grad: 3.6 mmHg
AV Peak grad: 6.6 mmHg
Ao pk vel: 1.28 m/s
Area-P 1/2: 4.31 cm2
Height: 64 in
S' Lateral: 1.7 cm
Weight: 1760 [oz_av]

## 2024-04-20 LAB — HEPATIC FUNCTION PANEL
ALT: 74 U/L — ABNORMAL HIGH (ref 0–44)
AST: 48 U/L — ABNORMAL HIGH (ref 15–41)
Albumin: 2.6 g/dL — ABNORMAL LOW (ref 3.5–5.0)
Alkaline Phosphatase: 74 U/L (ref 38–126)
Bilirubin, Direct: 0.1 mg/dL (ref 0.0–0.2)
Indirect Bilirubin: 0.2 mg/dL — ABNORMAL LOW (ref 0.3–0.9)
Total Bilirubin: 0.3 mg/dL (ref 0.0–1.2)
Total Protein: 5.8 g/dL — ABNORMAL LOW (ref 6.5–8.1)

## 2024-04-20 LAB — MAGNESIUM: Magnesium: 1.8 mg/dL (ref 1.7–2.4)

## 2024-04-20 MED ORDER — MIDODRINE HCL 5 MG PO TABS
2.5000 mg | ORAL_TABLET | Freq: Three times a day (TID) | ORAL | Status: DC
  Administered 2024-04-20 – 2024-04-21 (×2): 2.5 mg via ORAL
  Filled 2024-04-20 (×2): qty 1

## 2024-04-20 MED ORDER — REVEFENACIN 175 MCG/3ML IN SOLN
175.0000 ug | Freq: Every day | RESPIRATORY_TRACT | Status: DC
  Administered 2024-04-21: 175 ug via RESPIRATORY_TRACT
  Filled 2024-04-20: qty 3

## 2024-04-20 MED ORDER — ALBUTEROL SULFATE (2.5 MG/3ML) 0.083% IN NEBU
2.5000 mg | INHALATION_SOLUTION | Freq: Four times a day (QID) | RESPIRATORY_TRACT | Status: DC
  Administered 2024-04-20 – 2024-04-21 (×4): 2.5 mg via RESPIRATORY_TRACT
  Filled 2024-04-20 (×4): qty 3

## 2024-04-20 MED ORDER — SODIUM CHLORIDE 0.9 % IV BOLUS
500.0000 mL | Freq: Once | INTRAVENOUS | Status: AC
  Administered 2024-04-20: 500 mL via INTRAVENOUS

## 2024-04-20 NOTE — Plan of Care (Signed)

## 2024-04-20 NOTE — TOC Progression Note (Signed)
 Transition of Care [facility]) - Progression Note    Patient Details  Name: Alicia Sanchez MRN: [id number] Date of Birth: [birth year]  [facility] of [facility] CM/SW Contact  Geraldina Klinefelter, RN Phone Number: 04/20/2024, 10:51 AM  Clinical Narrative:    Echo scheduled for today. DC home tentatively planned for tomorrow. Pt has home oxygen  and wc.  Barriers to Discharge: Continued Medical Work up  Expected Discharge Plan and Services     Social Determinants of Health (SDOH) Interventions SDOH Screenings   Food Insecurity: Food Insecurity Present (04/18/2024)  Housing: Low Risk  (04/18/2024)  Transportation Needs: No Transportation Needs (04/18/2024)  Utilities: Not At Risk (04/18/2024)  Alcohol Screen: Low Risk  (01/16/2024)  Depression (PHQ2-9): Low Risk  (04/17/2024)  Financial Resource Strain: Medium Risk (01/16/2024)  Physical Activity: Inactive (01/16/2024)  Social Connections: Moderately Isolated (04/18/2024)  Stress: No Stress Concern Present (01/16/2024)  Tobacco Use: High Risk (04/17/2024)  Health Literacy: Adequate Health Literacy (12/23/2023)    Readmission Risk Interventions     No data to display

## 2024-04-20 NOTE — Discharge Summary (Signed)
 Physician Discharge Summary   Patient: Alicia Sanchez MRN: [id number] DOB: [birth year]  Admit date:     04/18/2024  Discharge date: 04/21/2024  Discharge Physician: Myrtie Atkinson Nthony Lefferts   PCP: Eliodoro Guerin, DO   Recommendations at discharge:   Please follow up with primary care provider within 1-2 weeks  Please repeat LFTs in one week Hold lipitor until LFTs repeated and show improvement    Hospital Course: Female with a history of COPD, tobacco abuse, nonobstructive CAD, anxiety/depression, hyperlipidemia, restless leg syndrome presenting for day history of worsening shortness of breath and left-sided chest pain.  The patient states that she has chronic chest pain which is usually worsened when her breathing is worse.  She has a worsening cough with yellow-green sputum.  She denies any hemoptysis.  She had a fever up to 100.7 F on 04/17/2024.  She denies any vomiting but has some nausea.  She denies any diarrhea, hematochezia, melena.  There is no dysuria or hematuria.  She continues to smoke about 1/2 pack/day.  She has an over 40-pack-year history of tobacco.  She has been using her nebulizer machine at home which she states helps a bit. She went to see her outpatient provider on 04/17/2024.  She was given IM injection of Depo-Medrol  and azithromycin  which she has not yet started.  Unfortunately, shortness of breath worsened overnight.  As a result she presented for further evaluation and treatment. In the ED, the patient had a low-grade temperature of 99.5 F.  She was tachycardic low 110s, but hemodynamically stable.  Oxygen  saturation was 88% on room air.  On 2 L, saturation is 97%.  WBC 22.3, hemoglobin 14.3, platelets 283.  Sodium 133, potassium 4.0, bicarbonate 23, serum creatinine 0.80.  AST 68, ALT 50, alk phosphatase 70, total bilirubin 1.0.  The patient was started on ceftriaxone and azithromycin .  Assessment and Plan: Acute on chronic respiratory failure with hypoxia and  hypercarbia -Patient is supposed to be on 2 L nasal cannula at home, but she does not wear it consistently. - Currently stable on 2 L>>weaned to RA - Secondary COPD exacerbation - Chest x-ray without any infiltrates or edema - COVID-19 PCR is negative - viral resp panel--neg - ambulatory pulse ox on day of d/c did not show desaturation < 90%   COPD exacerbation - continue DuoNebs - continue Brovana - continue Pulmicort  - continue IV Solu-Medrol >>d/c home with prednisone  - Viral respiratory panel--neg - COVID/RSV/flu--neg - d/c home with 2 more days cefdinir   - received 4 days azithro during hospitalization   Anxiety/depression - Continue home dose alprazolam , Seroquel , and Cymbalta  - PDMP reviewed - Patient receives alprazolam  0.5 mg, 120, last refill 04/01/2024   Tobacco abuse - Tobacco cessation discussed   Atypical chest pain - review of medical records shows long hx of recurrent/chronic chest pain - last saw Dr. Amanda Jungling 10/08/23---- no evidence of significant obstructive disease over several stress tests and CTA  - Troponin 7>>5 - Reproducible on exam - Check D-dimer 3.44 - 04/19/24 CTA chest--no PE, minimal peripheral opacities LLL - 6/2 echo EF 65-70%, no WMA, normal RVF, trivial pericardial effusion   Transaminasemia - 6/2 RUQ ultrasound--small amount perihepatic fluid, otherwise negative - Trend LFTs--remain mildly elevated but stable/improving - hold atorvastatin  temporarily - in part due to azithro - repeat LFTs one week after d/c    Mixed hyperlipidemia - holding statin temporarily         Consultants: none Procedures performed: none  Disposition: Home Diet recommendation:  Cardiac  diet DISCHARGE MEDICATION: Allergies as of 04/21/2024       Reactions   Doxycycline  Other (See Comments)   Chest pain        Medication List     STOP taking these medications    azithromycin  250 MG tablet Commonly known as: Zithromax  Z-Pak       TAKE these  medications    acetaminophen  500 MG tablet Commonly known as: TYLENOL  Take 1,000 mg by mouth every 6 (six) hours as needed for moderate pain.   albuterol  108 (90 Base) MCG/ACT inhaler Commonly known as: VENTOLIN  HFA INHALE 2 PUFFS BY MOUTH EVERY 6 HOURS AS NEEDED FOR WHEEZING FOR SHORTNESS OF BREATH What changed: See the new instructions.   alendronate  70 MG tablet Commonly known as: FOSAMAX  TAKE 1 TABLET EVERY 7 DAYS WITH A FULL GLASS OF WATER ON AN EMPTY STOMACH   ALPRAZolam  0.5 MG tablet Commonly known as: XANAX  Take one tablet four times daily. What changed:  how much to take how to take this when to take this   aspirin  EC 81 MG tablet Take 81 mg by mouth daily.   atorvastatin  40 MG tablet Commonly known as: LIPITOR Do not restart until ok by primary care provider What changed: additional instructions   Breztri  Aerosphere 160-9-4.8 MCG/ACT Aero inhaler Generic drug: budesonide -glycopyrrolate-formoterol  Inhale 2 puffs into the lungs 2 (two) times daily.   CAL-MAG-ZINC PO Take 1 tablet by mouth daily.   cefdinir  300 MG capsule Commonly known as: OMNICEF  Take 1 capsule (300 mg total) by mouth 2 (two) times daily.   cetirizine  10 MG tablet Commonly known as: ZYRTEC  Take 10 mg by mouth daily.   DULoxetine  60 MG capsule Commonly known as: CYMBALTA  Take 2 capsules (120 mg total) by mouth daily.   ipratropium-albuterol  0.5-2.5 (3) MG/3ML Soln Commonly known as: DUONEB INHALE THE CONTENTS OF 1 VIAL VIA NEBULIZER EVERY 6 HOURS AS NEEDED What changed: See the new instructions.   meloxicam  7.5 MG tablet Commonly known as: MOBIC  Take 1 tablet (7.5 mg total) by mouth daily.   midodrine  2.5 MG tablet Commonly known as: PROAMATINE  Take 2.5 mg by mouth 3 (three) times daily as needed (bp below 90/60).   Nebulizer/Tubing/Mouthpiece Kit 1 Units by Does not apply route 4 (four) times daily. GIVE face mask and tubing for nebulizer. J44.1   nitroGLYCERIN  0.4 MG SL  tablet Commonly known as: NITROSTAT  Place 1 tablet (0.4 mg total) under the tongue every 5 (five) minutes x 3 doses as needed for chest pain (if o relief after 2nd dose, proceed to the ED for an evaluation or call 911).   predniSONE  10 MG tablet Commonly known as: DELTASONE  Take 5 tablets (50 mg total) by mouth daily with breakfast. And decrease by one tablet daily Start taking on: April 22, 2024 What changed:  medication strength how much to take how to take this when to take this additional instructions   QUEtiapine  200 MG tablet Commonly known as: SEROQUEL  Take 1 tablet (200 mg total) by mouth at bedtime.   rOPINIRole  3 MG tablet Commonly known as: REQUIP  Take 1 tablet (3 mg total) by mouth at bedtime.   Vitamin D3 Maximum Strength 125 MCG (5000 UT) capsule Generic drug: Cholecalciferol Take 5,000 Units by mouth daily.        Discharge Exam: Filed Weights   04/18/24 0739  Weight: 49.9 kg   HEENT:  [place]/AT, No thrush, no icterus CV:  RRR, no rub, no S3, no S4 Lung:  bibasilar rales.  No wheeze Abd:  soft/+BS, NT Ext:  No edema, no lymphangitis, no synovitis, no rash   Condition at discharge: stable  The results of significant diagnostics from this hospitalization (including imaging, microbiology, ancillary and laboratory) are listed below for reference.   Imaging Studies: US  Abdomen Limited RUQ (LIVER/GB) Result Date: 04/20/2024 CLINICAL DATA:  Abdominal pain and transaminitis. EXAM: ULTRASOUND ABDOMEN LIMITED RIGHT UPPER QUADRANT COMPARISON:  CT angiogram chest 04/19/2024. FINDINGS: Gallbladder: No gallstones or wall thickening visualized. No sonographic Murphy sign noted by sonographer. Common bile duct: Diameter: 2 mm. Liver: No focal lesion identified. Within normal limits in parenchymal echogenicity. Portal vein is patent on color Doppler imaging with normal direction of blood flow towards the liver. Other: There is a small amount of perihepatic fluid. IMPRESSION:  1. Small amount of perihepatic fluid. 2. Otherwise unremarkable right upper quadrant ultrasound. Electronically Signed   By: Tyron Gallon M.D.   On: 04/20/2024 17:13   ECHOCARDIOGRAM COMPLETE Result Date: 04/20/2024    ECHOCARDIOGRAM REPORT   Patient Name:   CARLTON SWEANEY Date of Exam: 04/20/2024 Medical Rec #:  [id number]      Height:       64.0 in Accession #:    [id number]     Weight:       110.0 lb Date of Birth:  10/12/1961      BSA:          1.517 m Patient Age:    62 years       BP:           84/52 mmHg Patient Gender: F              HR:           80 bpm. Exam Location:  [facility] Procedure: 2D Echo, Cardiac Doppler and Color Doppler (Both Spectral and Color            Flow Doppler were utilized during procedure). Indications:    Chest Pain R07.9                 Pericardial effusion I31.3  History:        Patient has prior history of Echocardiogram examinations, most                 recent 02/18/2023. CAD, COPD, Signs/Symptoms:Syncope; Risk                 Factors:Current Smoker and Dyslipidemia.  Sonographer:    Denese Finn RCS Referring Phys: [phone] Karlo Goeden  Sonographer Comments: Image acquisition challenging due to respiratory motion. IMPRESSIONS  1. Left ventricular ejection fraction, by estimation, is 65 to 70%. The left ventricle has normal function. The left ventricle has no regional wall motion abnormalities. Left ventricular diastolic parameters were normal.  2. Right ventricular systolic function is normal. The right ventricular size is mildly enlarged.  3. The mitral valve is normal in structure. No evidence of mitral valve regurgitation. No evidence of mitral stenosis.  4. The aortic valve is tricuspid. Aortic valve regurgitation is not visualized. No aortic stenosis is present.  5. The inferior vena cava is normal in size with greater than 50% respiratory variability, suggesting right atrial pressure of 3 mmHg. FINDINGS  Left Ventricle: Left ventricular ejection fraction, by estimation, is 65  to 70%. The left ventricle has normal function. The left ventricle has no regional wall motion abnormalities. The left ventricular internal cavity size was normal in size. There is  no left  ventricular hypertrophy. Left ventricular diastolic parameters were normal. Right Ventricle: The right ventricular size is mildly enlarged. Right vetricular wall thickness was not well visualized. Right ventricular systolic function is normal. Left Atrium: Left atrial size was normal in size. Right Atrium: Right atrial size was normal in size. Pericardium: Trivial pericardial effusion is present. The pericardial effusion is circumferential. Mitral Valve: The mitral valve is normal in structure. No evidence of mitral valve regurgitation. No evidence of mitral valve stenosis. Tricuspid Valve: The tricuspid valve is normal in structure. Tricuspid valve regurgitation is not demonstrated. No evidence of tricuspid stenosis. Aortic Valve: The aortic valve is tricuspid. Aortic valve regurgitation is not visualized. No aortic stenosis is present. Aortic valve mean gradient measures 3.6 mmHg. Aortic valve peak gradient measures 6.6 mmHg. Aortic valve area, by VTI measures 2.00 cm. Pulmonic Valve: The pulmonic valve was not well visualized. Pulmonic valve regurgitation is not visualized. No evidence of pulmonic stenosis. Aorta: The aortic root is normal in size and structure. Venous: The inferior vena cava is normal in size with greater than 50% respiratory variability, suggesting right atrial pressure of 3 mmHg. IAS/Shunts: No atrial level shunt detected by color flow Doppler.  LEFT VENTRICLE PLAX 2D LVIDd:         3.70 cm   Diastology LVIDs:         1.70 cm   LV e' medial:    9.36 cm/s LV PW:         0.80 cm   LV E/e' medial:  11.8 LV IVS:        0.90 cm   LV e' lateral:   9.90 cm/s LVOT diam:     1.70 cm   LV E/e' lateral: 11.1 LV SV:         51 LV SV Index:   34 LVOT Area:     2.27 cm  RIGHT VENTRICLE RV S prime:     13.20 cm/s TAPSE  (M-mode): 2.3 cm LEFT ATRIUM             Index        RIGHT ATRIUM           Index LA diam:        3.00 cm 1.98 cm/m   RA Area:     11.80 cm LA Vol (A2C):   32.6 ml 21.48 ml/m  RA Volume:   24.40 ml  16.08 ml/m LA Vol (A4C):   37.0 ml 24.35 ml/m LA Biplane Vol: 35.1 ml 23.13 ml/m  AORTIC VALVE AV Area (Vmax):    2.02 cm AV Area (Vmean):   2.03 cm AV Area (VTI):     2.00 cm AV Vmax:           128.36 cm/s AV Vmean:          89.038 cm/s AV VTI:            0.257 m AV Peak Grad:      6.6 mmHg AV Mean Grad:      3.6 mmHg LVOT Vmax:         114.00 cm/s LVOT Vmean:        79.700 cm/s LVOT VTI:          0.226 m LVOT/AV VTI ratio: 0.88  AORTA Ao Root diam: 2.40 cm MITRAL VALVE                TRICUSPID VALVE MV Area (PHT): 4.31 cm     TR Peak grad:  18.0 mmHg MV Decel Time: 176 msec     TR Vmax:        212.00 cm/s MV E velocity: 110.00 cm/s MV A velocity: 68.60 cm/s   SHUNTS MV E/A ratio:  1.60         Systemic VTI:  0.23 m                             Systemic Diam: 1.70 cm Armida Lander MD Electronically signed by Armida Lander MD Signature Date/Time: 04/20/2024/2:37:42 PM    Final    CT Angio Chest Pulmonary Embolism (PE) W or WO Contrast Result Date: 04/19/2024 CLINICAL DATA:  Chest pain with shortness of breath. EXAM: CT ANGIOGRAPHY CHEST WITH CONTRAST TECHNIQUE: Multidetector CT imaging of the chest was performed using the standard protocol during bolus administration of intravenous contrast. Multiplanar CT image reconstructions and MIPs were obtained to evaluate the vascular anatomy. RADIATION DOSE REDUCTION: This exam was performed according to the departmental dose-optimization program which includes automated exposure control, adjustment of the mA and/or kV according to patient size and/or use of iterative reconstruction technique. CONTRAST:  75mL OMNIPAQUE  IOHEXOL  350 MG/ML SOLN COMPARISON:  Chest CT 04/12/2023. FINDINGS: Cardiovascular: Satisfactory opacification of the pulmonary arteries to the  segmental level. No evidence of pulmonary embolism. Normal heart size. There is a trace pericardial effusion. Mediastinum/Nodes: No enlarged mediastinal, hilar, or axillary lymph nodes. Thyroid  gland, trachea, and esophagus demonstrate no significant findings. Lungs/Pleura: Mild emphysema again seen. There are minimal peripheral patchy opacities in the inferior left lower lobe. Calcified granulomas in the left lung are unchanged. There is no pleural effusion or pneumothorax. Upper Abdomen: No acute abnormality. Musculoskeletal: There are healed left-sided rib fractures. Review of the MIP images confirms the above findings. IMPRESSION: 1. No evidence for pulmonary embolism. 2. Minimal peripheral patchy opacities in the inferior left lower lobe, possibly infectious/inflammatory. 3. Trace pericardial effusion. 4. Emphysema. Emphysema (ICD10-J43.9). Electronically Signed   By: Tyron Gallon M.D.   On: 04/19/2024 21:06   DG Chest Port 1 View Result Date: 04/18/2024 CLINICAL DATA:  SOB chronic cough EXAM: PORTABLE CHEST - 1 VIEW COMPARISON:  10/21/2023. FINDINGS: The heart size and mediastinal contours are within normal limits. Lungs are hyperinflated suggesting COPD. There is no focal consolidation. No pneumothorax or pleural effusion. The visualized skeletal structures are unremarkable. IMPRESSION: Findings suggest COPD.  Otherwise no acute cardiopulmonary disease. Electronically Signed   By: Sydell Eva M.D.   On: 04/18/2024 08:47    Microbiology: Results for orders placed or performed during the hospital encounter of 04/18/24  Resp panel by RT-PCR (RSV, Flu A&B, Covid) Anterior Nasal Swab     Status: None   Collection Time: 04/18/24  7:55 AM   Specimen: Anterior Nasal Swab  Result Value Ref Range Status   SARS Coronavirus 2 by RT PCR NEGATIVE NEGATIVE Final    Comment: (NOTE) SARS-CoV-2 target nucleic acids are NOT DETECTED.  The SARS-CoV-2 RNA is generally detectable in upper  respiratory specimens during the acute phase of infection. The lowest concentration of SARS-CoV-2 viral copies this assay can detect is 138 copies/mL. A negative result does not preclude SARS-Cov-2 infection and should not be used as the sole basis for treatment or other patient management decisions. A negative result may occur with  improper specimen collection/handling, submission of specimen other than nasopharyngeal swab, presence of viral mutation(s) within the areas targeted by this assay, and inadequate number of viral copies(<138  copies/mL). A negative result must be combined with clinical observations, patient history, and epidemiological information. The expected result is Negative.  Fact Sheet for Patients:  BloggerCourse.com  Fact Sheet for Healthcare Providers:  SeriousBroker.it  This test is no t yet approved or cleared by the [place]  FDA and  has been authorized for detection and/or diagnosis of SARS-CoV-2 by FDA under an Emergency Use Authorization (EUA). This EUA will remain  in effect (meaning this test can be used) for the duration of the COVID-19 declaration under Section 564(b)(1) of the Act, 21 U.S.C.section 360bbb-3(b)(1), unless the authorization is terminated  or revoked sooner.       Influenza A by PCR NEGATIVE NEGATIVE Final   Influenza B by PCR NEGATIVE NEGATIVE Final    Comment: (NOTE) The Xpert Xpress SARS-CoV-2/FLU/RSV plus assay is intended as an aid in the diagnosis of influenza from Nasopharyngeal swab specimens and should not be used as a sole basis for treatment. Nasal washings and aspirates are unacceptable for Xpert Xpress SARS-CoV-2/FLU/RSV testing.  Fact Sheet for Patients: BloggerCourse.com  Fact Sheet for Healthcare Providers: SeriousBroker.it  This test is not yet approved or cleared by the [place]  FDA and has been  authorized for detection and/or diagnosis of SARS-CoV-2 by FDA under an Emergency Use Authorization (EUA). This EUA will remain in effect (meaning this test can be used) for the duration of the COVID-19 declaration under Section 564(b)(1) of the Act, 21 U.S.C. section 360bbb-3(b)(1), unless the authorization is terminated or revoked.     Resp Syncytial Virus by PCR NEGATIVE NEGATIVE Final    Comment: (NOTE) Fact Sheet for Patients: BloggerCourse.com  Fact Sheet for Healthcare Providers: SeriousBroker.it  This test is not yet approved or cleared by the [place]  FDA and has been authorized for detection and/or diagnosis of SARS-CoV-2 by FDA under an Emergency Use Authorization (EUA). This EUA will remain in effect (meaning this test can be used) for the duration of the COVID-19 declaration under Section 564(b)(1) of the Act, 21 U.S.C. section 360bbb-3(b)(1), unless the authorization is terminated or revoked.  Performed at [facility], [address] [place], [place] [address]   Blood Culture (routine x 2)     Status: None (Preliminary result)   Collection Time: 04/18/24  8:10 AM   Specimen: BLOOD  Result Value Ref Range Status   Specimen Description BLOOD BLOOD RIGHT FOREARM  Final   Special Requests   Final    BOTTLES DRAWN AEROBIC AND ANAEROBIC Blood Culture results may not be optimal due to an inadequate volume of blood received in culture bottles   Culture   Final    NO GROWTH 3 DAYS Performed at [facility], [address], [place], [place] [address]    Report Status PENDING  Incomplete  Blood Culture (routine x 2)     Status: None (Preliminary result)   Collection Time: 04/18/24  8:15 AM   Specimen: BLOOD  Result Value Ref Range Status   Specimen Description BLOOD BLOOD RIGHT FOREARM  Final   Special Requests   Final    BOTTLES DRAWN AEROBIC AND ANAEROBIC Blood Culture results may not be optimal due to an  inadequate volume of blood received in culture bottles   Culture   Final    NO GROWTH 3 DAYS Performed at [facility], [address] [place], [place] [address]    Report Status PENDING  Incomplete  Respiratory (~20 pathogens) panel by PCR     Status: None   Collection Time:  04/18/24 10:59 AM   Specimen: Nasopharyngeal Swab; Respiratory  Result Value Ref Range Status   Adenovirus NOT DETECTED NOT DETECTED Final   Coronavirus 229E NOT DETECTED NOT DETECTED Final    Comment: (NOTE) The Coronavirus on the Respiratory Panel, DOES NOT test for the novel  Coronavirus (2019 nCoV)    Coronavirus HKU1 NOT DETECTED NOT DETECTED Final   Coronavirus NL63 NOT DETECTED NOT DETECTED Final   Coronavirus OC43 NOT DETECTED NOT DETECTED Final   Metapneumovirus NOT DETECTED NOT DETECTED Final   Rhinovirus / Enterovirus NOT DETECTED NOT DETECTED Final   Influenza A NOT DETECTED NOT DETECTED Final   Influenza B NOT DETECTED NOT DETECTED Final   Parainfluenza Virus 1 NOT DETECTED NOT DETECTED Final   Parainfluenza Virus 2 NOT DETECTED NOT DETECTED Final   Parainfluenza Virus 3 NOT DETECTED NOT DETECTED Final   Parainfluenza Virus 4 NOT DETECTED NOT DETECTED Final   Respiratory Syncytial Virus NOT DETECTED NOT DETECTED Final   Bordetella pertussis NOT DETECTED NOT DETECTED Final   Bordetella Parapertussis NOT DETECTED NOT DETECTED Final   Chlamydophila pneumoniae NOT DETECTED NOT DETECTED Final   Mycoplasma pneumoniae NOT DETECTED NOT DETECTED Final    Comment: Performed at [facility], [address], [place], [place] [address]    Labs: CBC: Recent Labs  Lab 04/18/24 0743 04/19/24 0346  WBC 22.3* 14.1*  HGB 14.9 11.6*  HCT 43.2 34.8*  MCV 89.6 89.2  PLT 283 247   Basic Metabolic Panel: Recent Labs  Lab 04/18/24 0810 04/19/24 0346 04/20/24 0308 04/21/24 0457  NA 133* 137 138 135  K 4.0 4.0 4.0 4.3  CL 97* 104 107 101  CO2 23 25 24 27   GLUCOSE 127* 148* 152* 127*  BUN 12 17  13 13   CREATININE 0.80 0.62 0.66 0.69  CALCIUM  9.1 8.4* 8.2* 8.1*  MG  --   --  1.8 1.8  PHOS  --   --  2.7  --    Liver Function Tests: Recent Labs  Lab 04/18/24 0810 04/19/24 0346 04/20/24 0308 04/21/24 0457  AST 68* 46* 48* 42*  ALT 58* 69* 74* 77*  ALKPHOS 70 58 74 56  BILITOT 1.0 0.3 0.3 0.4  PROT 7.3 5.7* 5.8* 5.5*  ALBUMIN 3.6 2.5* 2.6* 2.6*   CBG: No results for input(s): "GLUCAP" in the last 168 hours.  Discharge time spent: greater than 30 minutes.  Signed: Demaris Fillers, MD [facility] 04/21/2024

## 2024-04-20 NOTE — Progress Notes (Signed)
*  PRELIMINARY RESULTS* Echocardiogram 2D Echocardiogram has been performed.  Alicia Sanchez 04/20/2024, 1:54 PM

## 2024-04-20 NOTE — Progress Notes (Addendum)
 PROGRESS NOTE  Alicia Sanchez ZOX:[id number] DOB: 1961-08-11 DOA: 04/18/2024 PCP: Eliodoro Guerin, DO  Brief History:  Female with a history of COPD, tobacco abuse, nonobstructive CAD, anxiety/depression, hyperlipidemia, restless leg syndrome presenting for day history of worsening shortness of breath and left-sided chest pain.  The patient states that she has chronic chest pain which is usually worsened when her breathing is worse.  She has a worsening cough with yellow-green sputum.  She denies any hemoptysis.  She had a fever up to 100.7 F on 04/17/2024.  She denies any vomiting but has some nausea.  She denies any diarrhea, hematochezia, melena.  There is no dysuria or hematuria.  She continues to smoke about 1/2 pack/day.  She has an over 40-pack-year history of tobacco.  She has been using her nebulizer machine at home which she states helps a bit. She went to see her outpatient provider on 04/17/2024.  She was given IM injection of Depo-Medrol  and azithromycin  which she has not yet started.  Unfortunately, shortness of breath worsened overnight.  As a result she presented for further evaluation and treatment. In the ED, the patient had a low-grade temperature of 99.5 F.  She was tachycardic low 110s, but hemodynamically stable.  Oxygen  saturation was 88% on room air.  On 2 L, saturation is 97%.  WBC 22.3, hemoglobin 14.3, platelets 283.  Sodium 133, potassium 4.0, bicarbonate 23, serum creatinine 0.80.  AST 68, ALT 50, alk phosphatase 70, total bilirubin 1.0.  The patient was started on ceftriaxone and azithromycin .   Assessment/Plan: Acute on chronic respiratory failure with hypoxia and hypercarbia -Patient is supposed to be on 2 L nasal cannula at home, but she does not wear it consistently. - Currently stable on 2 L - Secondary COPD exacerbation - Chest x-ray without any infiltrates or edema - COVID-19 PCR is negative - viral resp panel--neg   COPD exacerbation - continue  DuoNebs - continue Brovana - continue Pulmicort  - continue IV Solu-Medrol  - Viral respiratory panel--neg - COVID/RSV/flu--neg   Anxiety/depression - Continue home dose alprazolam , Seroquel , and Cymbalta  - PDMP reviewed - Patient receives alprazolam  0.5 mg, 120, last refill 04/01/2024   Tobacco abuse - Tobacco cessation discussed   Atypical chest pain - review of medical records shows long hx of recurrent/chronic chest pain - last saw Dr. Amanda Jungling 10/08/23---- no evidence of significant obstructive disease over several stress tests and CTA  - Troponin 7>>5 - Reproducible on exam - Check D-dimer 3.44 - 04/19/24 CTA chest--no PE, minimal peripheral opacities LLL - obtain echo   Transaminasemia - Right upper quadrant ultrasound - Trend LFTs - hold atorvastatin  temporarily   Mixed hyperlipidemia - holding statin temporarily        Family Communication:  no Family at bedside   Consultants:  none   Code Status:  FULL    DVT Prophylaxis:   [place] Lovenox      Procedures: As Listed in Progress Note Above   Antibiotics: Ceftriaxone 5/31>> Azithro 5/31>>         Subjective: Pt complains of cp with cough.  Feels sob is improving slowly but has sob with exertion.  Denies n/v/d, hemoptysis, f/c  Objective: Vitals:   04/19/24 2114 04/20/24 0148 04/20/24 0534 04/20/24 0707  BP: (!) 94/59  (!) 84/52   Pulse: 88  80   Resp: 19  18   Temp: 97.8 F (36.6 C)  98.7 F (37.1 C)   TempSrc: Oral  SpO2: 94% 93% 99% 96%  Weight:      Height:        Intake/Output Summary (Last 24 hours) at 04/20/2024 0758 Last data filed at 04/20/2024 0644 Gross per 24 hour  Intake 340.83 ml  Output --  Net 340.83 ml   Weight change:  Exam:  General:  Pt is alert, follows commands appropriately, not in acute distress HEENT: No icterus, No thrush, No neck mass, [place]/AT Cardiovascular: RRR, S1/S2, no rubs, no gallops Respiratory: bibasilar rales.  Bibasilar wheeze Abdomen: Soft/+BS, non  tender, non distended, no guarding Extremities: No edema, No lymphangitis, No petechiae, No rashes, no synovitis   Data Reviewed: I have personally reviewed following labs and imaging studies Basic Metabolic Panel: Recent Labs  Lab 04/18/24 0810 04/19/24 0346 04/20/24 0308  NA 133* 137 138  K 4.0 4.0 4.0  CL 97* 104 107  CO2 23 25 24   GLUCOSE 127* 148* 152*  BUN 12 17 13   CREATININE 0.80 0.62 0.66  CALCIUM  9.1 8.4* 8.2*  MG  --   --  1.8  PHOS  --   --  2.7   Liver Function Tests: Recent Labs  Lab 04/18/24 0810 04/19/24 0346  AST 68* 46*  ALT 58* 69*  ALKPHOS 70 58  BILITOT 1.0 0.3  PROT 7.3 5.7*  ALBUMIN 3.6 2.5*   No results for input(s): "LIPASE", "AMYLASE" in the last 168 hours. No results for input(s): "AMMONIA" in the last 168 hours. Coagulation Profile: Recent Labs  Lab 04/18/24 0810  INR 1.0   CBC: Recent Labs  Lab 04/18/24 0743 04/19/24 0346  WBC 22.3* 14.1*  HGB 14.9 11.6*  HCT 43.2 34.8*  MCV 89.6 89.2  PLT 283 247   Cardiac Enzymes: No results for input(s): "CKTOTAL", "CKMB", "CKMBINDEX", "TROPONINI" in the last 168 hours. BNP: Invalid input(s): "POCBNP" CBG: No results for input(s): "GLUCAP" in the last 168 hours. HbA1C: No results for input(s): "HGBA1C" in the last 72 hours. Urine analysis:    Component Value Date/Time   COLORURINE YELLOW 05/21/2018 1906   APPEARANCEUR Clear 07/16/2023 1130   LABSPEC 1.010 05/21/2018 1906   PHURINE 7.0 05/21/2018 1906   GLUCOSEU Negative 07/16/2023 1130   HGBUR LARGE (A) 05/21/2018 1906   BILIRUBINUR Negative 07/16/2023 1130   KETONESUR NEGATIVE 05/21/2018 1906   PROTEINUR Negative 07/16/2023 1130   PROTEINUR NEGATIVE 05/21/2018 1906   UROBILINOGEN 0.2 12/12/2012 1945   NITRITE Positive (A) 07/16/2023 1130   NITRITE NEGATIVE 05/21/2018 1906   LEUKOCYTESUR Trace (A) 07/16/2023 1130   Sepsis Labs: @LABRCNTIP (procalcitonin:4,lacticidven:4) ) Recent Results (from the past 240 hours)  Resp  panel by RT-PCR (RSV, Flu A&B, Covid) Anterior Nasal Swab     Status: None   Collection Time: 04/18/24  7:55 AM   Specimen: Anterior Nasal Swab  Result Value Ref Range Status   SARS Coronavirus 2 by RT PCR NEGATIVE NEGATIVE Final    Comment: (NOTE) SARS-CoV-2 target nucleic acids are NOT DETECTED.  The SARS-CoV-2 RNA is generally detectable in upper respiratory specimens during the acute phase of infection. The lowest concentration of SARS-CoV-2 viral copies this assay can detect is 138 copies/mL. A negative result does not preclude SARS-Cov-2 infection and should not be used as the sole basis for treatment or other patient management decisions. A negative result may occur with  improper specimen collection/handling, submission of specimen other than nasopharyngeal swab, presence of viral mutation(s) within the areas targeted by this assay, and inadequate number of viral copies(<138 copies/mL). A  negative result must be combined with clinical observations, patient history, and epidemiological information. The expected result is Negative.  Fact Sheet for Patients:  BloggerCourse.com  Fact Sheet for Healthcare Providers:  SeriousBroker.it  This test is no t yet approved or cleared by the [place]  FDA and  has been authorized for detection and/or diagnosis of SARS-CoV-2 by FDA under an Emergency Use Authorization (EUA). This EUA will remain  in effect (meaning this test can be used) for the duration of the COVID-19 declaration under Section 564(b)(1) of the Act, 21 U.S.C.section 360bbb-3(b)(1), unless the authorization is terminated  or revoked sooner.       Influenza A by PCR NEGATIVE NEGATIVE Final   Influenza B by PCR NEGATIVE NEGATIVE Final    Comment: (NOTE) The Xpert Xpress SARS-CoV-2/FLU/RSV plus assay is intended as an aid in the diagnosis of influenza from Nasopharyngeal swab specimens and should not be used as a  sole basis for treatment. Nasal washings and aspirates are unacceptable for Xpert Xpress SARS-CoV-2/FLU/RSV testing.  Fact Sheet for Patients: BloggerCourse.com  Fact Sheet for Healthcare Providers: SeriousBroker.it  This test is not yet approved or cleared by the [place]  FDA and has been authorized for detection and/or diagnosis of SARS-CoV-2 by FDA under an Emergency Use Authorization (EUA). This EUA will remain in effect (meaning this test can be used) for the duration of the COVID-19 declaration under Section 564(b)(1) of the Act, 21 U.S.C. section 360bbb-3(b)(1), unless the authorization is terminated or revoked.     Resp Syncytial Virus by PCR NEGATIVE NEGATIVE Final    Comment: (NOTE) Fact Sheet for Patients: BloggerCourse.com  Fact Sheet for Healthcare Providers: SeriousBroker.it  This test is not yet approved or cleared by the [place]  FDA and has been authorized for detection and/or diagnosis of SARS-CoV-2 by FDA under an Emergency Use Authorization (EUA). This EUA will remain in effect (meaning this test can be used) for the duration of the COVID-19 declaration under Section 564(b)(1) of the Act, 21 U.S.C. section 360bbb-3(b)(1), unless the authorization is terminated or revoked.  Performed at [facility], [address], [place], [place] [address]   Blood Culture (routine x 2)     Status: None (Preliminary result)   Collection Time: 04/18/24  8:10 AM   Specimen: BLOOD  Result Value Ref Range Status   Specimen Description BLOOD BLOOD RIGHT FOREARM  Final   Special Requests   Final    BOTTLES DRAWN AEROBIC AND ANAEROBIC Blood Culture results may not be optimal due to an inadequate volume of blood received in culture bottles   Culture   Final    NO GROWTH 2 DAYS Performed at [facility], [address], [place], [place] [address]    Report Status  PENDING  Incomplete  Blood Culture (routine x 2)     Status: None (Preliminary result)   Collection Time: 04/18/24  8:15 AM   Specimen: BLOOD  Result Value Ref Range Status   Specimen Description BLOOD BLOOD RIGHT FOREARM  Final   Special Requests   Final    BOTTLES DRAWN AEROBIC AND ANAEROBIC Blood Culture results may not be optimal due to an inadequate volume of blood received in culture bottles   Culture   Final    NO GROWTH 2 DAYS Performed at [facility], [address] [place], [place] [address]    Report Status PENDING  Incomplete  Respiratory (~20 pathogens) panel by PCR     Status: None   Collection Time: 04/18/24 10:59  AM   Specimen: Nasopharyngeal Swab; Respiratory  Result Value Ref Range Status   Adenovirus NOT DETECTED NOT DETECTED Final   Coronavirus 229E NOT DETECTED NOT DETECTED Final    Comment: (NOTE) The Coronavirus on the Respiratory Panel, DOES NOT test for the novel  Coronavirus (2019 nCoV)    Coronavirus HKU1 NOT DETECTED NOT DETECTED Final   Coronavirus NL63 NOT DETECTED NOT DETECTED Final   Coronavirus OC43 NOT DETECTED NOT DETECTED Final   Metapneumovirus NOT DETECTED NOT DETECTED Final   Rhinovirus / Enterovirus NOT DETECTED NOT DETECTED Final   Influenza A NOT DETECTED NOT DETECTED Final   Influenza B NOT DETECTED NOT DETECTED Final   Parainfluenza Virus 1 NOT DETECTED NOT DETECTED Final   Parainfluenza Virus 2 NOT DETECTED NOT DETECTED Final   Parainfluenza Virus 3 NOT DETECTED NOT DETECTED Final   Parainfluenza Virus 4 NOT DETECTED NOT DETECTED Final   Respiratory Syncytial Virus NOT DETECTED NOT DETECTED Final   Bordetella pertussis NOT DETECTED NOT DETECTED Final   Bordetella Parapertussis NOT DETECTED NOT DETECTED Final   Chlamydophila pneumoniae NOT DETECTED NOT DETECTED Final   Mycoplasma pneumoniae NOT DETECTED NOT DETECTED Final    Comment: Performed at [facility], [address] [place], [place] [address]     Scheduled Meds:   ALPRAZolam   0.25 mg Oral TID   arformoterol  15 mcg Nebulization BID   aspirin  EC  81 mg Oral Daily   atorvastatin   40 mg Oral Daily   azithromycin   500 mg Oral Daily   budesonide  (PULMICORT ) nebulizer solution  0.5 mg Nebulization BID   DULoxetine   120 mg Oral Daily   enoxaparin  (LOVENOX ) injection  40 mg Subcutaneous Q24H   ipratropium-albuterol   3 mL Nebulization Once   ipratropium-albuterol   3 mL Nebulization Q6H   loratadine  10 mg Oral Daily   methylPREDNISolone  (SOLU-MEDROL ) injection  40 mg Intravenous Q12H   pantoprazole   40 mg Oral Daily   QUEtiapine   200 mg Oral QHS   rOPINIRole   3 mg Oral QHS   Continuous Infusions:  cefTRIAXone (ROCEPHIN)  IV Stopped (04/19/24 2725)    Procedures/Studies: CT Angio Chest Pulmonary Embolism (PE) W or WO Contrast Result Date: 04/19/2024 CLINICAL DATA:  Chest pain with shortness of breath. EXAM: CT ANGIOGRAPHY CHEST WITH CONTRAST TECHNIQUE: Multidetector CT imaging of the chest was performed using the standard protocol during bolus administration of intravenous contrast. Multiplanar CT image reconstructions and MIPs were obtained to evaluate the vascular anatomy. RADIATION DOSE REDUCTION: This exam was performed according to the departmental dose-optimization program which includes automated exposure control, adjustment of the mA and/or kV according to patient size and/or use of iterative reconstruction technique. CONTRAST:  75mL OMNIPAQUE  IOHEXOL  350 MG/ML SOLN COMPARISON:  Chest CT 04/12/2023. FINDINGS: Cardiovascular: Satisfactory opacification of the pulmonary arteries to the segmental level. No evidence of pulmonary embolism. Normal heart size. There is a trace pericardial effusion. Mediastinum/Nodes: No enlarged mediastinal, hilar, or axillary lymph nodes. Thyroid  gland, trachea, and esophagus demonstrate no significant findings. Lungs/Pleura: Mild emphysema again seen. There are minimal peripheral patchy opacities in the inferior left lower lobe.  Calcified granulomas in the left lung are unchanged. There is no pleural effusion or pneumothorax. Upper Abdomen: No acute abnormality. Musculoskeletal: There are healed left-sided rib fractures. Review of the MIP images confirms the above findings. IMPRESSION: 1. No evidence for pulmonary embolism. 2. Minimal peripheral patchy opacities in the inferior left lower lobe, possibly infectious/inflammatory. 3. Trace pericardial effusion. 4. Emphysema. Emphysema (  ICD10-J43.9). Electronically Signed   By: Tyron Gallon M.D.   On: 04/19/2024 21:06   DG Chest Port 1 View Result Date: 04/18/2024 CLINICAL DATA:  SOB chronic cough EXAM: PORTABLE CHEST - 1 VIEW COMPARISON:  10/21/2023. FINDINGS: The heart size and mediastinal contours are within normal limits. Lungs are hyperinflated suggesting COPD. There is no focal consolidation. No pneumothorax or pleural effusion. The visualized skeletal structures are unremarkable. IMPRESSION: Findings suggest COPD.  Otherwise no acute cardiopulmonary disease. Electronically Signed   By: Sydell Eva M.D.   On: 04/18/2024 08:47    Demaris Fillers, DO  [facility]  If 7PM-7AM, please contact night-coverage [email or web address] Password [email or web address] 04/20/2024, 7:58 AM   LOS: 2 days

## 2024-04-21 ENCOUNTER — Other Ambulatory Visit: Payer: Self-pay | Admitting: Family Medicine

## 2024-04-21 ENCOUNTER — Telehealth: Payer: Self-pay | Admitting: Family Medicine

## 2024-04-21 DIAGNOSIS — J432 Centrilobular emphysema: Secondary | ICD-10-CM

## 2024-04-21 LAB — HEPATITIS PANEL, ACUTE
HCV Ab: NONREACTIVE
Hep A IgM: NONREACTIVE
Hep B C IgM: NONREACTIVE
Hepatitis B Surface Ag: NONREACTIVE

## 2024-04-21 LAB — MAGNESIUM: Magnesium: 1.8 mg/dL (ref 1.7–2.4)

## 2024-04-21 LAB — COMPREHENSIVE METABOLIC PANEL WITH GFR
ALT: 77 U/L — ABNORMAL HIGH (ref 0–44)
AST: 42 U/L — ABNORMAL HIGH (ref 15–41)
Albumin: 2.6 g/dL — ABNORMAL LOW (ref 3.5–5.0)
Alkaline Phosphatase: 56 U/L (ref 38–126)
Anion gap: 7 (ref 5–15)
BUN: 13 mg/dL (ref 8–23)
CO2: 27 mmol/L (ref 22–32)
Calcium: 8.1 mg/dL — ABNORMAL LOW (ref 8.9–10.3)
Chloride: 101 mmol/L (ref 98–111)
Creatinine, Ser: 0.69 mg/dL (ref 0.44–1.00)
GFR, Estimated: >60 mL/min (ref >60)
Glucose, Bld: 127 mg/dL — ABNORMAL HIGH (ref 70–99)
Potassium: 4.3 mmol/L (ref 3.5–5.1)
Sodium: 135 mmol/L (ref 135–145)
Total Bilirubin: 0.4 mg/dL (ref 0.0–1.2)
Total Protein: 5.5 g/dL — ABNORMAL LOW (ref 6.5–8.1)

## 2024-04-21 MED ORDER — CEFDINIR 300 MG PO CAPS: 300.0000 mg | ORAL_CAPSULE | Freq: Two times a day (BID) | ORAL | 0 refills | Status: DC

## 2024-04-21 MED ORDER — ATORVASTATIN CALCIUM 40 MG PO TABS: ORAL_TABLET | ORAL | 3 refills | Status: DC

## 2024-04-21 MED ORDER — PREDNISONE 10 MG PO TABS
50.0000 mg | ORAL_TABLET | Freq: Every day | ORAL | 0 refills | Status: DC
Start: 2024-04-22 — End: 2024-04-28

## 2024-04-21 MED ORDER — PREDNISONE 20 MG PO TABS: 50.0000 mg | ORAL_TABLET | Freq: Every day | ORAL | Status: DC

## 2024-04-21 NOTE — Telephone Encounter (Signed)
 Can patient be worked in to see Dr Bonnell Butcher on 6/10 which is her DOD day?   Copied from CRM [id number]. Topic: Appointments - Appointment Scheduling >> Apr 21, 2024  2:42 PM Tiffany S wrote: Patient/patient representative is calling to schedule an appointment. Refer to attachments for appointment information.   Patient is needing a hospital f/u no appts until August  please follow up with patient

## 2024-04-21 NOTE — Care Management Important Message (Signed)
 Important Message  Patient Details  Name: Alicia Sanchez MRN: [id number] Date of Birth: 1961/07/11   Important Message Given:  Yes - Medicare IM     Buddie Marston L Quinto Tippy 04/21/2024, 11:32 AM

## 2024-04-21 NOTE — Plan of Care (Signed)

## 2024-04-21 NOTE — Telephone Encounter (Signed)
 Of course. It's a hospital follow up. That's appropriate for DOD

## 2024-04-23 ENCOUNTER — Other Ambulatory Visit: Payer: Self-pay | Admitting: *Deleted

## 2024-04-23 DIAGNOSIS — Z122 Encounter for screening for malignant neoplasm of respiratory organs: Secondary | ICD-10-CM

## 2024-04-23 DIAGNOSIS — Z87891 Personal history of nicotine dependence: Secondary | ICD-10-CM

## 2024-04-23 DIAGNOSIS — F17210 Nicotine dependence, cigarettes, uncomplicated: Secondary | ICD-10-CM

## 2024-04-23 LAB — CULTURE, BLOOD (ROUTINE X 2)

## 2024-04-28 ENCOUNTER — Ambulatory Visit: Admitting: Family Medicine

## 2024-04-28 ENCOUNTER — Ambulatory Visit: Payer: Self-pay | Admitting: Family Medicine

## 2024-04-28 ENCOUNTER — Encounter: Payer: Self-pay | Admitting: Family Medicine

## 2024-04-28 VITALS — BP 85/61 | HR 120 | Temp 98.3°F | Ht 64.0 in | Wt 112.4 lb

## 2024-04-28 DIAGNOSIS — Z09 Encounter for follow-up examination after completed treatment for conditions other than malignant neoplasm: Secondary | ICD-10-CM | POA: Diagnosis not present

## 2024-04-28 DIAGNOSIS — J9601 Acute respiratory failure with hypoxia: Secondary | ICD-10-CM | POA: Diagnosis not present

## 2024-04-28 DIAGNOSIS — R300 Dysuria: Secondary | ICD-10-CM

## 2024-04-28 DIAGNOSIS — D72829 Elevated white blood cell count, unspecified: Secondary | ICD-10-CM

## 2024-04-28 DIAGNOSIS — R7989 Other specified abnormal findings of blood chemistry: Secondary | ICD-10-CM

## 2024-04-28 DIAGNOSIS — N179 Acute kidney failure, unspecified: Secondary | ICD-10-CM

## 2024-04-28 DIAGNOSIS — J441 Chronic obstructive pulmonary disease with (acute) exacerbation: Secondary | ICD-10-CM

## 2024-04-28 LAB — URINALYSIS, ROUTINE W REFLEX MICROSCOPIC
Bilirubin, UA: NEGATIVE
Glucose, UA: NEGATIVE
Ketones, UA: NEGATIVE
Leukocytes,UA: NEGATIVE
Nitrite, UA: NEGATIVE
Protein,UA: NEGATIVE
Specific Gravity, UA: 1.01 (ref 1.005–1.030)
Urobilinogen, Ur: 0.2 mg/dL (ref 0.2–1.0)
pH, UA: 7.5 (ref 5.0–7.5)

## 2024-04-28 LAB — MICROSCOPIC EXAMINATION
Bacteria, UA: NONE SEEN
WBC, UA: NONE SEEN /HPF (ref 0–5)

## 2024-04-28 MED ORDER — CEFDINIR 300 MG PO CAPS: 300.0000 mg | ORAL_CAPSULE | Freq: Two times a day (BID) | ORAL | 0 refills | Status: AC

## 2024-04-28 NOTE — Progress Notes (Signed)
 Subjective: [id number] discharge follow up PCP: Eliodoro Guerin, DO LOV:[id number] Giammona is a 63 y.o. female presenting to clinic today for:  1. Hypoxia due to COPD exacerbation Patient here for follow up on COPD exacerbation. She reports some ongoing symptoms despite completion of antibiotics and prednisone .  She has not reached out to her pulmonologist yet to schedule an appointment but plans to do so today.  She continues to use her DuoNebs and prescribed inhaler as directed.  She reports no hemoptysis or fevers.  She continues to suffer from fatigue.  She has not taken any of her midodrine  for her low blood pressures but this is prescribed by her cardiologist   ROS: Per HPI  Allergies  Allergen Reactions   Doxycycline  Other (See Comments)    Chest pain   Past Medical History:  Diagnosis Date   Anxiety    Aortic atherosclerosis (HCC)    Chronic kidney disease    COPD (chronic obstructive pulmonary disease) (HCC)    Depression    Headache    History of chest pain    History of syncope    Hypotension    Osteoporosis    Palpitations    Panic attacks    Pneumonia    Restless legs syndrome (RLS)     Current Outpatient Medications:    acetaminophen  (TYLENOL ) 500 MG tablet, Take 1,000 mg by mouth every 6 (six) hours as needed for moderate pain., Disp: , Rfl:    albuterol  (VENTOLIN  HFA) 108 (90 Base) MCG/ACT inhaler, INHALE 2 PUFFS BY MOUTH EVERY 6 HOURS AS NEEDED FOR WHEEZING FOR SHORTNESS OF BREATH, Disp: 9 g, Rfl: 1   alendronate  (FOSAMAX ) 70 MG tablet, TAKE 1 TABLET EVERY 7 DAYS WITH A FULL GLASS OF WATER ON AN EMPTY STOMACH, Disp: 12 tablet, Rfl: 3   ALPRAZolam  (XANAX ) 0.5 MG tablet, Take one tablet four times daily. (Patient taking differently: Take 0.5 mg by mouth in the morning, at noon, in the evening, and at bedtime. Take one tablet four times daily.), Disp: 120 tablet, Rfl: 2   aspirin  EC 81 MG tablet, Take 81 mg by mouth daily., Disp: , Rfl:    atorvastatin   (LIPITOR) 40 MG tablet, Do not restart until ok by primary care provider, Disp: 90 tablet, Rfl: 3   budeson-glycopyrrolate-formoterol  (BREZTRI  AEROSPHERE) 160-9-4.8 MCG/ACT AERO, Inhale 2 puffs into the lungs 2 (two) times daily., Disp: 32.1 g, Rfl: 5   Calcium -Magnesium -Zinc (CAL-MAG-ZINC PO), Take 1 tablet by mouth daily., Disp: , Rfl:    cefdinir  (OMNICEF ) 300 MG capsule, Take 1 capsule (300 mg total) by mouth 2 (two) times daily., Disp: 4 capsule, Rfl: 0   cetirizine  (ZYRTEC ) 10 MG tablet, Take 10 mg by mouth daily., Disp: , Rfl:    Cholecalciferol (VITAMIN D3 MAXIMUM STRENGTH) 125 MCG (5000 UT) capsule, Take 5,000 Units by mouth daily., Disp: , Rfl:    DULoxetine  (CYMBALTA ) 60 MG capsule, Take 2 capsules (120 mg total) by mouth daily., Disp: 180 capsule, Rfl: 3   ipratropium-albuterol  (DUONEB) 0.5-2.5 (3) MG/3ML SOLN, INHALE THE CONTENTS OF 1 VIAL VIA NEBULIZER EVERY 6 HOURS AS NEEDED (Patient taking differently: Take 3 mLs by nebulization every 4 (four) hours as needed (Shortness of breath).), Disp: 360 mL, Rfl: 11   meloxicam  (MOBIC ) 7.5 MG tablet, Take 1 tablet (7.5 mg total) by mouth daily., Disp: 90 tablet, Rfl: 3   midodrine  (PROAMATINE ) 2.5 MG tablet, Take 2.5 mg by mouth 3 (three) times daily as needed (bp below 90/60)., Disp: ,  Rfl:    nitroGLYCERIN  (NITROSTAT ) 0.4 MG SL tablet, Place 1 tablet (0.4 mg total) under the tongue every 5 (five) minutes x 3 doses as needed for chest pain (if o relief after 2nd dose, proceed to the ED for an evaluation or call 911)., Disp: 25 tablet, Rfl: 2   predniSONE  (DELTASONE ) 10 MG tablet, Take 5 tablets (50 mg total) by mouth daily with breakfast. And decrease by one tablet daily, Disp: 15 tablet, Rfl: 0   QUEtiapine  (SEROQUEL ) 200 MG tablet, Take 1 tablet (200 mg total) by mouth at bedtime., Disp: 90 tablet, Rfl: 1   Respiratory Therapy Supplies (NEBULIZER/TUBING/MOUTHPIECE) KIT, 1 Units by Does not apply route 4 (four) times daily. GIVE face mask and  tubing for nebulizer. J44.1, Disp: 1 kit, Rfl: 0   rOPINIRole  (REQUIP ) 3 MG tablet, Take 1 tablet (3 mg total) by mouth at bedtime., Disp: 90 tablet, Rfl: 3 Social History   Socioeconomic History   Marital status: Married    Spouse name: Not on file   Number of children: 2   Years of education: Not on file   Highest education level: GED or equivalent  Occupational History   Not on file  Tobacco Use   Smoking status: Every Day    Current packs/day: 0.50    Average packs/day: 0.5 packs/day for 42.0 years (21.0 ttl pk-yrs)    Types: Cigarettes   Smokeless tobacco: Never   Tobacco comments:    Smoking 4-5 cigarettes.  11/28/2023 hfb  Vaping Use   Vaping status: Never Used  Substance and Sexual Activity   Alcohol use: Yes    Comment: soical   Drug use: Yes    Frequency: 1.0 times per week    Types: Marijuana    Comment: not in few weeks   Sexual activity: Yes    Birth control/protection: Surgical  Other Topics Concern   Not on file  Social History Narrative   Not on file   Social Drivers of Health   Financial Resource Strain: Medium Risk (01/16/2024)   Overall Financial Resource Strain (CARDIA)    Difficulty of Paying Living Expenses: Somewhat hard  Food Insecurity: Food Insecurity Present (04/18/2024)   Hunger Vital Sign    Worried About Running Out of Food in the Last Year: Sometimes true    Ran Out of Food in the Last Year: Sometimes true  Transportation Needs: No Transportation Needs (04/18/2024)   PRAPARE - Administrator, Civil Service (Medical): No    Lack of Transportation (Non-Medical): No  Physical Activity: Inactive (01/16/2024)   Exercise Vital Sign    Days of Exercise per Week: 0 days    Minutes of Exercise per Session: 0 min  Stress: No Stress Concern Present (01/16/2024)   [employer] of Occupational Health - Occupational Stress Questionnaire    Feeling of Stress : Only a little  Social Connections: Moderately Isolated (04/18/2024)   Social  Connection and Isolation Panel [NHANES]    Frequency of Communication with Friends and Family: More than three times a week    Frequency of Social Gatherings with Friends and Family: Once a week    Attends Religious Services: Never    Database administrator or Organizations: Yes    Attends Banker Meetings: Never    Marital Status: Separated  Intimate Partner Violence: Not At Risk (04/18/2024)   Humiliation, Afraid, Rape, and Kick questionnaire    Fear of Current or Ex-Partner: No    Emotionally Abused:  No    Physically Abused: No    Sexually Abused: No   Family History  Problem Relation Age of Onset   COPD Mother    COPD Father    Diabetes type II Sister    Kidney failure Sister    Diabetes type II Brother    COPD Maternal Grandfather    Breast cancer Neg Hx    Liver disease Neg Hx    Esophageal cancer Neg Hx    Colon cancer Neg Hx     Objective: Office vital signs reviewed. BP (!) 85/61   Pulse (!) 120   Temp 98.3 F (36.8 C)   Ht 5\' 4"  (1.626 m)   Wt 112 lb 6.4 oz (51 kg)   SpO2 97%   BMI 19.29 kg/m   Physical Examination:  General: Awake, alert, thin, nontoxic female, No acute distress HEENT: Sclera white. Cardio: regular rate and rhythm, S1S2 heard, no murmurs appreciated Pulm: Globally decreased breath sounds with no wheezes, rhonchi or rales.  Coughs intermittently on exam.  Normal work of breathing on room air  Assessment/ Plan: 63 y.o. female   Acute respiratory failure with hypoxia (HCC) - Plan: CMP14+EGFR, CBC, cefdinir  (OMNICEF ) 300 MG capsule  COPD with acute exacerbation (HCC) - Plan: CMP14+EGFR, CBC, cefdinir  (OMNICEF ) 300 MG capsule  Hospital discharge follow-up - Plan: CMP14+EGFR, CBC  Elevated liver function tests - Plan: CMP14+EGFR  Dysuria - Plan: Urinalysis, Routine w reflex microscopic  O2 within normal range.  Recheck CMP, CBC given persistent leukocytosis and elevation in LFTs at discharge.  I am going to extend her  Omnicef  out for 7 more days.  Continue inhalers as directed.  Schedule OV with pulmonologist.  I will CC him on this chart.  I wonder if she would be a candidate for prophylactic azithromycin ?  BP soft.  She was initially tachycardic but after resting she had normal rate.  I recommended that she start back on her midodrine  as directed by cardiology.  Will check urinalysis per her request that she has been having some vaginal irritation that she thinks may be related to a lubricant.  Omnicef  should cover any UTI however  Orders Placed This Encounter  Procedures   CMP14+EGFR   CBC   Urinalysis, Routine w reflex microscopic   No orders of the defined types were placed in this encounter.  Eliodoro Guerin, DO [facility] [phone]

## 2024-04-29 LAB — CBC
Hematocrit: 46.5 % (ref 34.0–46.6)
Hemoglobin: 15.5 g/dL (ref 11.1–15.9)
MCH: 30.8 pg (ref 26.6–33.0)
MCHC: 33.3 g/dL (ref 31.5–35.7)
MCV: 92 fL (ref 79–97)
Platelets: 429 10*3/uL (ref 150–450)
RBC: 5.03 x10E6/uL (ref 3.77–5.28)
RDW: 12.5 % (ref 11.7–15.4)
WBC: 14.4 10*3/uL — ABNORMAL HIGH (ref 3.4–10.8)

## 2024-04-29 LAB — CMP14+EGFR
ALT: 45 IU/L — ABNORMAL HIGH (ref 0–32)
AST: 25 IU/L (ref 0–40)
Albumin: 4.3 g/dL (ref 3.9–4.9)
Alkaline Phosphatase: 92 IU/L (ref 44–121)
BUN/Creatinine Ratio: 15 (ref 12–28)
BUN: 15 mg/dL (ref 8–27)
Bilirubin Total: 0.3 mg/dL (ref 0.0–1.2)
CO2: 25 mmol/L (ref 20–29)
Calcium: 9.8 mg/dL (ref 8.7–10.3)
Chloride: 99 mmol/L (ref 96–106)
Creatinine, Ser: 1.02 mg/dL — ABNORMAL HIGH (ref 0.57–1.00)
Globulin, Total: 2.4 g/dL (ref 1.5–4.5)
Glucose: 58 mg/dL — ABNORMAL LOW (ref 70–99)
Potassium: 4.7 mmol/L (ref 3.5–5.2)
Sodium: 142 mmol/L (ref 134–144)
Total Protein: 6.7 g/dL (ref 6.0–8.5)
eGFR: 62 mL/min/{1.73_m2} (ref >59)

## 2024-04-30 ENCOUNTER — Ambulatory Visit: Payer: Self-pay

## 2024-04-30 NOTE — Telephone Encounter (Signed)
 RN made the first attempt to contact the pt. Pt did not answer. RN LVM with a CB number.   Copied from CRM [id number]. Topic: General - Other >> Apr 30, 2024  3:10 PM Tiffany S wrote: Reason for CRM: Patient is still having issues with BP being low 77/52 asking if there is anything else that can be done please follow up patient

## 2024-04-30 NOTE — Telephone Encounter (Signed)
 FYI Only or Action Required?: FYI only for provider  Patient was last seen in primary care on 04/28/2024 by Eliodoro Guerin, DO. Called Nurse Triage reporting Hypotension. Symptoms began today. Interventions attempted: Nothing. Symptoms are: stable.  Triage Disposition: See PCP When Office is Open (Within 3 Days) Patient scheduled.   Patient/caregiver understands and will follow disposition?: yes        Reason for Disposition  Brief (now gone) dizziness or lightheadedness after standing up or eating  Answer Assessment - Initial Assessment Questions 1. BLOOD PRESSURE: What is the blood pressure? Did you take at least two measurements 5 minutes apart?      --------- 77/52 (This am) -------- 118/73 ( during the call)    2. ONSET: When did you take your blood pressure?     -------------- Several days    3. HOW: How did you obtain the blood pressure? (e.g., visiting nurse, automatic home BP monitor)      -------------Home monitor    4. HISTORY: Do you have a history of low blood pressure? What is your blood pressure normally?     ---- Yes.    5. MEDICINES: Are you taking any medications for blood pressure? If Yes, ask: Have they been changed recently?     ----- Midodrine     6. PULSE RATE: Do you know what your pulse rate is?      ------------------   7. OTHER SYMPTOMS: Have you been sick recently? Have you had a recent injury?   ---------Nauseous and fatigue ( chronic)  Protocols used: Blood Pressure - Low-A-AH

## 2024-05-01 ENCOUNTER — Other Ambulatory Visit

## 2024-05-01 ENCOUNTER — Ambulatory Visit (INDEPENDENT_AMBULATORY_CARE_PROVIDER_SITE_OTHER): Admitting: Family Medicine

## 2024-05-01 ENCOUNTER — Encounter: Payer: Self-pay | Admitting: Family Medicine

## 2024-05-01 VITALS — BP 90/60 | HR 87 | Temp 98.8°F | Ht 64.0 in | Wt 113.0 lb

## 2024-05-01 DIAGNOSIS — I959 Hypotension, unspecified: Secondary | ICD-10-CM

## 2024-05-01 DIAGNOSIS — J3489 Other specified disorders of nose and nasal sinuses: Secondary | ICD-10-CM

## 2024-05-01 DIAGNOSIS — D72829 Elevated white blood cell count, unspecified: Secondary | ICD-10-CM

## 2024-05-01 DIAGNOSIS — J9601 Acute respiratory failure with hypoxia: Secondary | ICD-10-CM

## 2024-05-01 DIAGNOSIS — N179 Acute kidney failure, unspecified: Secondary | ICD-10-CM

## 2024-05-01 NOTE — Progress Notes (Signed)
 Subjective:  Patient ID: Alicia Sanchez, female    DOB: 09/07/1961, 63 y.o.   MRN: [id number]  Patient Care Team: Eliodoro Guerin, DO as PCP - General (Family Medicine) Amanda Jungling Joyceann No, MD as PCP - Cardiology (Cardiology) Phyllis Breeze, MD as Consulting Physician (Pulmonary Disease) Delilah Fend, [facility] (Pharmacist) Mozingo, Ursula Gardner, NP as Nurse Practitioner (Psychiatry) Lasalle Pointer, NP as Nurse Practitioner (Cardiology)   Chief Complaint:  Hypotension  HPI: Alicia Sanchez is a 63 y.o. female presenting on 05/01/2024 for Hypotension  HPI States that she has been having issues with her BP for over a year. Reports that she has it every day. She has midodrine  for when her BP is below 90/60. She did not take it today. Reports that BP yesterday was 77/52, took one midodrine  and felt tired, weak, all day. Napped most of the day. BP increased to 84/57, 80/53, took a second dose in the afternoon. Does not recall what her BP was after the second dose of midodrine . Reports that she drinks 6-7 bottles of water per day. She is not wearing compression stockings. She is established with cardiology. She does not have follow up scheduled with them.   Sinusitis  Continues on abx and feels that she is getting better.  Reports that her breathing is improving. She is using nebulizer treatment at least 2x per day, sometimes 3 times per day. She reports that this is baseline for her. She is using albuterol  inhaler in between and states that this is baseline as well. She has a pulmonologist, but has not followed up with them. She is working with PCP and pulmonology for abx chronic use.   Relevant past medical, surgical, family, and social history reviewed and updated as indicated.  Allergies and medications reviewed and updated. Data reviewed: Chart in Epic.   Past Medical History:  Diagnosis Date   Anxiety    Aortic atherosclerosis (HCC)    Chronic kidney disease    COPD (chronic  obstructive pulmonary disease) (HCC)    Depression    Headache    History of chest pain    History of syncope    Hypotension    Osteoporosis    Palpitations    Panic attacks    Pneumonia    Restless legs syndrome (RLS)     Past Surgical History:  Procedure Laterality Date   ABDOMINAL HYSTERECTOMY     BIOPSY  05/20/2023   Procedure: BIOPSY;  Surgeon: Albertina Hugger, MD;  Location: [facility];  Service: Gastroenterology;;   COLONOSCOPY WITH PROPOFOL  N/A 05/20/2023   Procedure: COLONOSCOPY WITH PROPOFOL ;  Surgeon: Albertina Hugger, MD;  Location: [facility];  Service: Gastroenterology;  Laterality: N/A;   ESOPHAGOGASTRODUODENOSCOPY (EGD) WITH PROPOFOL  N/A 05/20/2023   Procedure: ESOPHAGOGASTRODUODENOSCOPY (EGD) WITH PROPOFOL ;  Surgeon: Albertina Hugger, MD;  Location: [facility];  Service: Gastroenterology;  Laterality: N/A;    Social History   Socioeconomic History   Marital status: Married    Spouse name: Not on file   Number of children: 2   Years of education: Not on file   Highest education level: GED or equivalent  Occupational History   Not on file  Tobacco Use   Smoking status: Every Day    Current packs/day: 0.50    Average packs/day: 0.5 packs/day for 42.0 years (21.0 ttl pk-yrs)    Types: Cigarettes   Smokeless tobacco: Never   Tobacco comments:    Smoking 4-5 cigarettes.  11/28/2023  hfb  Vaping Use   Vaping status: Never Used  Substance and Sexual Activity   Alcohol use: Yes    Comment: soical   Drug use: Yes    Frequency: 1.0 times per week    Types: Marijuana    Comment: not in few weeks   Sexual activity: Yes    Birth control/protection: Surgical  Other Topics Concern   Not on file  Social History Narrative   Not on file   Social Drivers of Health   Financial Resource Strain: Medium Risk (01/16/2024)   Overall Financial Resource Strain (CARDIA)    Difficulty of Paying Living Expenses: Somewhat hard  Food Insecurity: Food Insecurity  Present (04/18/2024)   Hunger Vital Sign    Worried About Running Out of Food in the Last Year: Sometimes true    Ran Out of Food in the Last Year: Sometimes true  Transportation Needs: No Transportation Needs (04/18/2024)   PRAPARE - Administrator, Civil Service (Medical): No    Lack of Transportation (Non-Medical): No  Physical Activity: Inactive (01/16/2024)   Exercise Vital Sign    Days of Exercise per Week: 0 days    Minutes of Exercise per Session: 0 min  Stress: No Stress Concern Present (01/16/2024)   [employer] of Occupational Health - Occupational Stress Questionnaire    Feeling of Stress : Only a little  Social Connections: Moderately Isolated (04/18/2024)   Social Connection and Isolation Panel    Frequency of Communication with Friends and Family: More than three times a week    Frequency of Social Gatherings with Friends and Family: Once a week    Attends Religious Services: Never    Database administrator or Organizations: Yes    Attends Banker Meetings: Never    Marital Status: Separated  Intimate Partner Violence: Not At Risk (04/18/2024)   Humiliation, Afraid, Rape, and Kick questionnaire    Fear of Current or Ex-Partner: No    Emotionally Abused: No    Physically Abused: No    Sexually Abused: No    Outpatient Encounter Medications as of 05/01/2024  Medication Sig   acetaminophen  (TYLENOL ) 500 MG tablet Take 1,000 mg by mouth every 6 (six) hours as needed for moderate pain.   albuterol  (VENTOLIN  HFA) 108 (90 Base) MCG/ACT inhaler INHALE 2 PUFFS BY MOUTH EVERY 6 HOURS AS NEEDED FOR WHEEZING FOR SHORTNESS OF BREATH   alendronate  (FOSAMAX ) 70 MG tablet TAKE 1 TABLET EVERY 7 DAYS WITH A FULL GLASS OF WATER ON AN EMPTY STOMACH   ALPRAZolam  (XANAX ) 0.5 MG tablet Take one tablet four times daily. (Patient taking differently: Take 0.5 mg by mouth in the morning, at noon, in the evening, and at bedtime. Take one tablet four times daily.)    aspirin  EC 81 MG tablet Take 81 mg by mouth daily.   atorvastatin  (LIPITOR) 40 MG tablet Do not restart until ok by primary care provider   budeson-glycopyrrolate-formoterol  (BREZTRI  AEROSPHERE) 160-9-4.8 MCG/ACT AERO Inhale 2 puffs into the lungs 2 (two) times daily.   Calcium -Magnesium -Zinc (CAL-MAG-ZINC PO) Take 1 tablet by mouth daily.   cefdinir  (OMNICEF ) 300 MG capsule Take 1 capsule (300 mg total) by mouth 2 (two) times daily for 7 days.   cetirizine  (ZYRTEC ) 10 MG tablet Take 10 mg by mouth daily.   Cholecalciferol (VITAMIN D3 MAXIMUM STRENGTH) 125 MCG (5000 UT) capsule Take 5,000 Units by mouth daily.   DULoxetine  (CYMBALTA ) 60 MG capsule Take 2 capsules (120 mg  total) by mouth daily.   ipratropium-albuterol  (DUONEB) 0.5-2.5 (3) MG/3ML SOLN INHALE THE CONTENTS OF 1 VIAL VIA NEBULIZER EVERY 6 HOURS AS NEEDED (Patient taking differently: Take 3 mLs by nebulization every 4 (four) hours as needed (Shortness of breath).)   meloxicam  (MOBIC ) 7.5 MG tablet Take 1 tablet (7.5 mg total) by mouth daily.   midodrine  (PROAMATINE ) 2.5 MG tablet Take 2.5 mg by mouth 3 (three) times daily as needed (bp below 90/60).   nitroGLYCERIN  (NITROSTAT ) 0.4 MG SL tablet Place 1 tablet (0.4 mg total) under the tongue every 5 (five) minutes x 3 doses as needed for chest pain (if o relief after 2nd dose, proceed to the ED for an evaluation or call 911).   QUEtiapine  (SEROQUEL ) 200 MG tablet Take 1 tablet (200 mg total) by mouth at bedtime.   Respiratory Therapy Supplies (NEBULIZER/TUBING/MOUTHPIECE) KIT 1 Units by Does not apply route 4 (four) times daily. GIVE face mask and tubing for nebulizer. J44.1   rOPINIRole  (REQUIP ) 3 MG tablet Take 1 tablet (3 mg total) by mouth at bedtime.   No facility-administered encounter medications on file as of 05/01/2024.    Allergies  Allergen Reactions   Doxycycline  Other (See Comments)    Chest pain    Review of Systems As per HPI  Objective:  BP 90/60   Pulse 87    Temp 98.8 F (37.1 C)   Ht 5' 4 (1.626 m)   Wt 113 lb (51.3 kg)   SpO2 97%   BMI 19.40 kg/m    Wt Readings from Last 3 Encounters:  05/01/24 113 lb (51.3 kg)  04/28/24 112 lb 6.4 oz (51 kg)  04/18/24 110 lb (49.9 kg)    Physical Exam Constitutional:      General: She is awake. She is not in acute distress.    Appearance: Normal appearance. She is well-developed and well-groomed. She is not ill-appearing, toxic-appearing or diaphoretic.   Cardiovascular:     Rate and Rhythm: Normal rate and regular rhythm.     Pulses: Normal pulses.          Radial pulses are 2+ on the right side and 2+ on the left side.       Posterior tibial pulses are 2+ on the right side and 2+ on the left side.     Heart sounds: Normal heart sounds. No murmur heard.    No gallop.  Pulmonary:     Effort: Pulmonary effort is normal. No respiratory distress.     Breath sounds: Decreased air movement present. No stridor. Decreased breath sounds present. No wheezing, rhonchi or rales.   Musculoskeletal:     Cervical back: Full passive range of motion without pain and neck supple.     Right lower leg: No edema.     Left lower leg: No edema.   Skin:    General: Skin is warm.     Capillary Refill: Capillary refill takes less than 2 seconds.   Neurological:     General: No focal deficit present.     Mental Status: She is alert, oriented to person, place, and time and easily aroused. Mental status is at baseline.     GCS: GCS eye subscore is 4. GCS verbal subscore is 5. GCS motor subscore is 6.     Motor: No weakness.   Psychiatric:        Attention and Perception: Attention and perception normal.        Mood and Affect: Mood and affect  normal.        Speech: Speech normal.        Behavior: Behavior normal. Behavior is cooperative.        Thought Content: Thought content normal. Thought content does not include homicidal or suicidal ideation. Thought content does not include homicidal or suicidal plan.         Cognition and Memory: Cognition and memory normal.        Judgment: Judgment normal.     Results for orders placed or performed in visit on 04/28/24  Microscopic Examination   Collection Time: 04/28/24  2:45 PM   Urine  Result Value Ref Range   WBC, UA None seen 0 - 5 /hpf   RBC, Urine 0-2 0 - 2 /hpf   Epithelial Cells (non renal) 0-10 0 - 10 /hpf   Bacteria, UA None seen None seen/Few  Urinalysis, Routine w reflex microscopic   Collection Time: 04/28/24  2:45 PM  Result Value Ref Range   Specific Gravity, UA 1.010 1.005 - 1.030   pH, UA 7.5 5.0 - 7.5   Color, UA Yellow Yellow   Appearance Ur Clear Clear   Leukocytes,UA Negative Negative   Protein,UA Negative Negative/Trace   Glucose, UA Negative Negative   Ketones, UA Negative Negative   RBC, UA Trace (A) Negative   Bilirubin, UA Negative Negative   Urobilinogen, Ur 0.2 0.2 - 1.0 mg/dL   Nitrite, UA Negative Negative   Microscopic Examination See below:   CMP14+EGFR   Collection Time: 04/28/24  2:47 PM  Result Value Ref Range   Glucose 58 (L) 70 - 99 mg/dL   BUN 15 8 - 27 mg/dL   Creatinine, Ser 1.61 (H) 0.57 - 1.00 mg/dL   eGFR 62 >09 UE/AVW/0.98   BUN/Creatinine Ratio 15 12 - 28   Sodium 142 134 - 144 mmol/L   Potassium 4.7 3.5 - 5.2 mmol/L   Chloride 99 96 - 106 mmol/L   CO2 25 20 - 29 mmol/L   Calcium  9.8 8.7 - 10.3 mg/dL   Total Protein 6.7 6.0 - 8.5 g/dL   Albumin 4.3 3.9 - 4.9 g/dL   Globulin, Total 2.4 1.5 - 4.5 g/dL   Bilirubin Total 0.3 0.0 - 1.2 mg/dL   Alkaline Phosphatase 92 44 - 121 IU/L   AST 25 0 - 40 IU/L   ALT 45 (H) 0 - 32 IU/L  CBC   Collection Time: 04/28/24  2:47 PM  Result Value Ref Range   WBC 14.4 (H) 3.4 - 10.8 x10E3/uL   RBC 5.03 3.77 - 5.28 x10E6/uL   Hemoglobin 15.5 11.1 - 15.9 g/dL   Hematocrit 11.9 14.7 - 46.6 %   MCV 92 79 - 97 fL   MCH 30.8 26.6 - 33.0 pg   MCHC 33.3 31.5 - 35.7 g/dL   RDW 82.9 56.2 - 13.0 %   Platelets 429 150 - 450 x10E3/uL       04/28/2024     2:22 PM 04/17/2024    9:43 AM 01/17/2024   10:55 AM 12/23/2023   11:41 AM 10/21/2023    4:06 PM  Depression screen PHQ 2/9  Decreased Interest  0 1 1 1   Down, Depressed, Hopeless 0 0 1 1 1   PHQ - 2 Score 0 0 2 2 2   Altered sleeping 1  2 2 2   Tired, decreased energy 1  2 2 2   Change in appetite 1  1 1 1   Feeling bad or failure about yourself  1  1 1 1   Trouble concentrating 0  0 1 1  Moving slowly or fidgety/restless 0  0 0 1  Suicidal thoughts 0  0 0 0  PHQ-9 Score 4  8 9 10   Difficult doing work/chores Somewhat difficult  Not difficult at all  Somewhat difficult       04/28/2024    2:22 PM 01/17/2024   10:55 AM 10/21/2023    4:06 PM 08/23/2023   12:23 PM  GAD 7 : Generalized Anxiety Score  Nervous, Anxious, on Edge 0 0 1 1  Control/stop worrying 0 0 0 2  Worry too much - different things 0 0 0 2  Trouble relaxing 0 0 0 1  Restless 0 0 1 1  Easily annoyed or irritable 0 0 0 1  Afraid - awful might happen 0 0 0 2  Total GAD 7 Score 0 0 2 10  Anxiety Difficulty Not difficult at all Not difficult at all Somewhat difficult Somewhat difficult   Pertinent labs & imaging results that were available during my care of the patient were reviewed by me and considered in my medical decision making.  Assessment & Plan:  Alicia Sanchez was seen today for hypotension.  Diagnoses and all orders for this visit: 1. Hypotension, unspecified hypotension type (Primary) BP stable in office today.  Recommend that patient start midodrine  2.5 mg scheduled in am.  Can take 2.5 mg as needed in afternoons and at night for hypotension. Reviewed notes from PCP, Gottschalk, DO, 04/28/2024 and Cardiology, Amanda Jungling, MD 10/08/2023. Discussed with patient that her symptoms have changed since her visit in November and she should follow up with her cardiologist. She verbalized understanding and that she has their contact information to make an appt.   2. Acute respiratory failure with hypoxia (HCC) Oxygen  saturation and work  of breathing stable in office today. Patient is established with pulmonology. Follow up with specialty.   3. Sinus drainage Improving symptoms. Continue abx. Follow up for new or worsening symptoms.   4. AKI (acute kidney injury) (HCC) Previously ordered labs from PCP, patient plans to complete today.  - Basic metabolic panel with GFR  5. Leukocytosis, unspecified type As above.  - CBC with Differential/Platelet   Continue all other maintenance medications.  Follow up plan: Return if symptoms worsen or fail to improve.   Continue healthy lifestyle choices, including diet (rich in fruits, vegetables, and lean proteins, and low in salt and simple carbohydrates) and exercise (at least 30 minutes of moderate physical activity daily).  Written and verbal instructions provided   The above assessment and management plan was discussed with the patient. The patient verbalized understanding of and has agreed to the management plan. Patient is aware to call the clinic if they develop any new symptoms or if symptoms persist or worsen. Patient is aware when to return to the clinic for a follow-up visit. Patient educated on when it is appropriate to go to the emergency department.   Jacqualyn Mates, DNP-FNP [facility] [address] [place], [place] [address] [phone]

## 2024-05-01 NOTE — Telephone Encounter (Signed)
 Appointment today. LS

## 2024-05-02 LAB — CBC WITH DIFFERENTIAL/PLATELET
Basophils Absolute: 0 10*3/uL (ref 0.0–0.2)
Basos: 0 %
EOS (ABSOLUTE): 0.1 10*3/uL (ref 0.0–0.4)
Eos: 1 %
Hematocrit: 44 % (ref 34.0–46.6)
Hemoglobin: 14.5 g/dL (ref 11.1–15.9)
Immature Grans (Abs): 0.1 10*3/uL (ref 0.0–0.1)
Immature Granulocytes: 1 %
Lymphocytes Absolute: 1.8 10*3/uL (ref 0.7–3.1)
Lymphs: 21 %
MCH: 31 pg (ref 26.6–33.0)
MCHC: 33 g/dL (ref 31.5–35.7)
MCV: 94 fL (ref 79–97)
Monocytes Absolute: 0.7 10*3/uL (ref 0.1–0.9)
Monocytes: 8 %
Neutrophils Absolute: 5.8 10*3/uL (ref 1.4–7.0)
Neutrophils: 69 %
Platelets: 411 10*3/uL (ref 150–450)
RBC: 4.67 x10E6/uL (ref 3.77–5.28)
RDW: 12.8 % (ref 11.7–15.4)
WBC: 8.5 10*3/uL (ref 3.4–10.8)

## 2024-05-02 LAB — BASIC METABOLIC PANEL WITH GFR
BUN/Creatinine Ratio: 16 (ref 12–28)
BUN: 13 mg/dL (ref 8–27)
CO2: 25 mmol/L (ref 20–29)
Calcium: 9.6 mg/dL (ref 8.7–10.3)
Chloride: 99 mmol/L (ref 96–106)
Creatinine, Ser: 0.79 mg/dL (ref 0.57–1.00)
Glucose: 81 mg/dL (ref 70–99)
Potassium: 4.9 mmol/L (ref 3.5–5.2)
Sodium: 140 mmol/L (ref 134–144)
eGFR: 84 mL/min/{1.73_m2} (ref >59)

## 2024-05-04 ENCOUNTER — Ambulatory Visit: Payer: Self-pay | Admitting: Family Medicine

## 2024-05-05 ENCOUNTER — Encounter: Payer: Self-pay | Admitting: Nurse Practitioner

## 2024-05-05 ENCOUNTER — Telehealth: Payer: Self-pay | Admitting: Cardiology

## 2024-05-05 ENCOUNTER — Ambulatory Visit: Attending: Nurse Practitioner | Admitting: Nurse Practitioner

## 2024-05-05 VITALS — BP 88/67 | HR 106 | Ht 64.0 in | Wt 114.2 lb

## 2024-05-05 DIAGNOSIS — R000 Tachycardia, unspecified: Secondary | ICD-10-CM | POA: Diagnosis not present

## 2024-05-05 DIAGNOSIS — R0789 Other chest pain: Secondary | ICD-10-CM

## 2024-05-05 DIAGNOSIS — Z8679 Personal history of other diseases of the circulatory system: Secondary | ICD-10-CM | POA: Diagnosis not present

## 2024-05-05 DIAGNOSIS — I951 Orthostatic hypotension: Secondary | ICD-10-CM | POA: Diagnosis not present

## 2024-05-05 DIAGNOSIS — I959 Hypotension, unspecified: Secondary | ICD-10-CM | POA: Diagnosis not present

## 2024-05-05 DIAGNOSIS — Z720 Tobacco use: Secondary | ICD-10-CM

## 2024-05-05 DIAGNOSIS — J449 Chronic obstructive pulmonary disease, unspecified: Secondary | ICD-10-CM | POA: Diagnosis not present

## 2024-05-05 DIAGNOSIS — R55 Syncope and collapse: Secondary | ICD-10-CM

## 2024-05-05 MED ORDER — MIDODRINE HCL 2.5 MG PO TABS: 2.5000 mg | ORAL_TABLET | Freq: Three times a day (TID) | ORAL | 3 refills | Status: DC

## 2024-05-05 NOTE — Progress Notes (Unsigned)
 Office Visit    Patient Name: Alicia Sanchez Date of Encounter: 05/05/2024  PCP:  Eliodoro Guerin DO   [facility]  Cardiologist:  Armida Lander, MD  Advanced Practice Provider:  Lasalle Pointer, NP Electrophysiologist:  None  [phone]  Chief Complaint    Alicia Sanchez is a 63 y.o. female with a hx of syncope, palpitations, history of chest pain, COPD, osteoporosis, anxiety/depression/panic attacks who presents today for follow-up.   Past Medical History    Past Medical History:  Diagnosis Date   Anxiety    Aortic atherosclerosis (HCC)    Chronic kidney disease    COPD (chronic obstructive pulmonary disease) (HCC)    Depression    Headache    History of chest pain    History of syncope    Hypotension    Osteoporosis    Palpitations    Panic attacks    Pneumonia    Restless legs syndrome (RLS)    Past Surgical History:  Procedure Laterality Date   ABDOMINAL HYSTERECTOMY     BIOPSY  05/20/2023   Procedure: BIOPSY;  Surgeon: Albertina Hugger, MD;  Location: [facility];  Service: Gastroenterology;;   COLONOSCOPY WITH PROPOFOL  N/A 05/20/2023   Procedure: COLONOSCOPY WITH PROPOFOL ;  Surgeon: Albertina Hugger, MD;  Location: [facility];  Service: Gastroenterology;  Laterality: N/A;   ESOPHAGOGASTRODUODENOSCOPY (EGD) WITH PROPOFOL  N/A 05/20/2023   Procedure: ESOPHAGOGASTRODUODENOSCOPY (EGD) WITH PROPOFOL ;  Surgeon: Albertina Hugger, MD;  Location: [facility];  Service: Gastroenterology;  Laterality: N/A;    Allergies  Allergies  Allergen Reactions   Doxycycline  Other (See Comments)    Chest pain    History of Present Illness    Alicia Sanchez is a 63 y.o. female with a past medical history as mentioned above.  Coronary CTA in 2021 revealed coronary calcium  score of 45, nonobstructive mild CAD noted. History consistent with orthostatic syncope. Last seen by Dr. Armida Lander on Mar 27, 2022.  EKG showed normal sinus rhythm.   Conservative measures discussed regarding orthostatic syncope, recommended if ongoing symptoms may need to consider Florinef.  Was seen at [facility] 01/26/2023 for COPD exacerbation.  I last saw her for follow-up on 01/28/2023.  She stated that her syncopal episode happened 2 weeks ago, had a fall that resulted in nasal fracture, concussion, and was diagnosed with pneumonia. Completed treatment for pneumonia and was going to see an ENT the following day.  Admitted to low blood pressures, DBP averaging 80s at times, dizziness, and recent near syncopal episode. Denied any chest pain, shortness of breath, palpitations, orthopnea, PND, swelling or significant weight changes, acute bleeding, or claudication.  Denied any recurrent syncopal episodes.  Workup revealed normal Myoview , unremarkable echocardiogram, and preliminary monitor report revealed predominantly normal sinus rhythm, 1 VT episode lasting 7 beats, 11 SVT episodes with longest lasting around 20 beats, average heart rate 148 bpm, some rare ventricular ectopy.  Today she presents for follow-up.  She tells me she was admitted last month for COPD exacerbation.  She is doing well today. Denies any recurrent syncopal/pre-syncopal episodes. Does admit to palpitations and feeling like her heart is racing. Says she gets short of breath with these episodes and was given breathing treatment recently. Denies any chest pain, syncope, presyncope, dizziness, orthopnea, PND, swelling or significant weight changes, acute bleeding, or claudication.  COPD - sese a pulmonologist.   Tight eith exertion and then goes away with rest copd.   Lightheaded.  Orthostatic - long ... A least a year or more.   Passed out last year - pna.   Wears compression stocks.    EKGs/Labs/Other Studies Reviewed:   The following studies were reviewed today:   EKG:  EKG is not ordered today.    Preliminary monitor report 02/2023: Patient had a min HR of 57 bpm, max HR  of 179 bpm, and avg HR of 98 bpm. Predominant underlying rhythm was Sinus Rhythm. 1 run of Ventricular Tachycardia occurred lasting 7 beats with a max rate of 152 bpm (avg 126 bpm). 11 Supraventricular Tachycardia  runs occurred, the run with the fastest interval lasting 5 beats with a max rate of 179 bpm, the longest lasting 20 beats with an avg rate of 148 bpm. Isolated SVEs were rare (<1.0%), SVE Couplets were rare (<1.0%), and SVE Triplets were rare (<1.0%).  Isolated VEs were rare (<1.0%), VE Couplets were rare (<1.0%), and no VE Triplets were present.   Echocardiogram 02/2023: 1. Left ventricular ejection fraction, by estimation, is >75%. The left  ventricle has hyperdynamic function. The left ventricle has no regional  wall motion abnormalities. There is mild left ventricular hypertrophy.  Left ventricular diastolic parameters  are consistent with Grade I diastolic dysfunction (impaired relaxation).   2. Right ventricular systolic function is normal. The right ventricular  size is normal.   3. The mitral valve is normal in structure. No evidence of mitral valve  regurgitation. No evidence of mitral stenosis.   4. The aortic valve was not well visualized. Aortic valve regurgitation  is not visualized.   5. The inferior vena cava is normal in size with greater than 50%  respiratory variability, suggesting right atrial pressure of 3 mmHg.   Comparison(s): Nuclear stress test done 08/03/21 showed an EF of 80%.  Echocardiogram done 07/11/18 showed an EF of 60-65%.  Myoview  01/2023:   The study is normal. The study is low risk.   No ST deviation was noted. The ECG was negative for ischemia.   LV perfusion is normal.  No significant myocardial perfusion defects to indicate scar or ischemia.   Left ventricular function is normal. Nuclear stress EF: 85 %.   Low risk study with no significant myocardial perfusion defects to indicate scar or ischemia.  LVEF 85%.   Lexiscan  on August 03, 2021:   The study is low risk.   No ST deviation was noted. The ECG was negative for ischemia.   LV perfusion is abnormal. Defect 1: There is a small defect with mild reduction in uptake present in the mid inferior location(s) that is partially reversible. There is normal wall motion in the defect area. Consistent with mild ischemia.   Left ventricular function is normal. Nuclear stress EF: 80 %.   Small, partially reversible, mid inferior wall defect suggesting mild ischemic territory with vigorous LVEF 80%.  Low risk study.  Coronary CTA on February 04, 2020: IMPRESSION: 1. Coronary artery calcium  score 45 Agatston units. This places the patient in the 85th percentile for age and gender, suggesting high risk for future cardiac events.   2.  Nonobstructive mild CAD noted.  Echocardiogram on July 11, 2018: Study Conclusions   - Left ventricle: The cavity size was normal. Wall thickness was    normal. Systolic function was normal. The estimated ejection    fraction was in the range of 60% to 65%. Wall motion was normal;    there were no regional wall motion abnormalities. Left  ventricular diastolic function parameters were normal.  - Aortic valve: Mildly calcified annulus. Probably trileaflet.  - Mitral valve: Mildly calcified annulus. There was trivial    regurgitation.  - Right ventricle: The cavity size was mildly dilated.  - Right atrium: Central venous pressure (est): 3 mm Hg.  - Atrial septum: No defect or patent foramen ovale was identified.  - Tricuspid valve: There was trivial regurgitation.  - Pericardium, extracardiac: A prominent pericardial fat pad was    present.   Recent Labs: 04/21/2024: Magnesium  1.8 04/28/2024: ALT 45 05/01/2024: BUN 13; Creatinine, Ser 0.79; Hemoglobin 14.5; Platelets 411; Potassium 4.9; Sodium 140  Recent Lipid Panel    Component Value Date/Time   CHOL 137 10/09/2023 1018   TRIG 73 10/09/2023 1018   HDL 77 10/09/2023 1018   CHOLHDL 1.8  10/09/2023 1018   LDLCALC 46 10/09/2023 1018    Risk Assessment/Calculations:   The 10-year ASCVD risk score (Arnett DK, et al., 2019) is: 3.9%   Values used to calculate the score:     Age: 89 years     Clincally relevant sex: Female     Is Non-Hispanic African American: No     Diabetic: No     Tobacco smoker: Yes     Systolic Blood Pressure: 90 mmHg     Is BP treated: Yes     HDL Cholesterol: 77 mg/dL     Total Cholesterol: 137 mg/dL   Home Medications   Current Meds  Medication Sig   albuterol  (VENTOLIN  HFA) 108 (90 Base) MCG/ACT inhaler INHALE 2 PUFFS INTO THE LUNGS EVERY 6 (SIX) HOURS AS NEEDED FOR WHEEZING OR SHORTNESS OF BREATH.   alendronate  (FOSAMAX ) 70 MG tablet TAKE 1 TABLET EVERY 7 DAYS WITH A FULL GLASS OF WATER ON AN EMPTY STOMACH (Patient taking differently: Take 70 mg by mouth once a week.)   ALPRAZolam  (XANAX ) 0.5 MG tablet TAKE 1 TABLET BY MOUTH THREE TIMES DAILY AND  1  EXTRA  PRN  FOR  INCREASED  ANXIETY (Patient taking differently: Take 0.5 mg by mouth 4 (four) times daily as needed for anxiety. TAKE 1 TABLET BY MOUTH THREE TIMES DAILY AND  1  EXTRA  PRN  FOR  INCREASED  ANXIETY)   aspirin  EC 81 MG tablet Take 81 mg by mouth daily.   atorvastatin  (LIPITOR) 40 MG tablet TAKE 1 TABLET EVERY DAY (NEED MD APPOINTMENT FOR REFILLS) (Patient taking differently: Take 40 mg by mouth daily.)   Budeson-Glycopyrrol-Formoterol  (BREZTRI  AEROSPHERE) 160-9-4.8 MCG/ACT AERO Inhale 2 puffs into the lungs 2 (two) times daily.   DULoxetine  (CYMBALTA ) 60 MG capsule TAKE 2 CAPSULES EVERY DAY   ipratropium-albuterol  (DUONEB) 0.5-2.5 (3) MG/3ML SOLN INHALE THE CONTENTS OF 1 VIAL VIA NEBULIZER EVERY 6 HOURS AS NEEDED (Patient taking differently: Inhale 3 mLs into the lungs every 6 (six) hours as needed (Wheezing/SOB).)   meloxicam  (MOBIC ) 7.5 MG tablet Take 1 tablet (7.5 mg total) by mouth daily.   metoprolol  tartrate (LOPRESSOR ) 25 MG tablet Take 0.5 tablets (12.5 mg total) by mouth 2  (two) times daily as needed (palpitations).   nitroGLYCERIN  (NITROSTAT ) 0.4 MG SL tablet Place 1 tablet (0.4 mg total) under the tongue every 5 (five) minutes x 3 doses as needed for chest pain (if o relief after 2nd dose, proceed to the ED for an evaluation or call 911).   Respiratory Therapy Supplies (NEBULIZER/TUBING/MOUTHPIECE) KIT 1 Units by Does not apply route 4 (four) times daily. GIVE face mask and tubing for nebulizer.  J44.1   rOPINIRole  (REQUIP ) 1 MG tablet TAKE 1-4 TABLETS BY MOUTH AT BEDTIME. (Patient taking differently: Take 3 mg by mouth at bedtime.)    Review of Systems    All other systems reviewed and are otherwise negative except as noted above.  Physical Exam    VS:  There were no vitals taken for this visit. , BMI There is no height or weight on file to calculate BMI.  Wt Readings from Last 3 Encounters:  05/01/24 113 lb (51.3 kg)  04/28/24 112 lb 6.4 oz (51 kg)  04/18/24 110 lb (49.9 kg)     GEN: Well nourished, well developed, in no acute distress. HEENT: normal. Neck: Supple, no JVD, carotid bruits, or masses. Cardiac: S1/S2, fast rate and normal rhythm, no murmurs, rubs, or gallops. No clubbing, cyanosis, edema.  Radials/PT 2+ and equal bilaterally.  Respiratory:  Respirations regular and unlabored, clear/diminished to auscultation bilaterally. MS: No deformity or atrophy. Skin: Warm and dry, no rash. Neuro:  Strength and sensation are intact. Tremors to bilateral arms  Psych: Normal affect.  Assessment & Plan    Hx of syncope, near syncope, low/soft BP Etiology not clear, no recurrent LOC/presyncope.  Was told in the past to be orthostatic in nature.  Previous negative orthostatics on exam.  Echocardiogram and Myoview  unremarkable.  Recent labs unremarkable.  Monitor was negative for any high degree AV block or significant pauses.  Continue current medication regimen. Heart healthy diet and regular cardiovascular exercise encouraged.  ED precautions  discussed.  2.  Palpitations, history of PSVT, tachycardia Does admit to palpitations/elevated heart rate at times with associated shortness of breath.  Heart rate 105 bpm today.  Preliminary monitor report revealed 1 run of VT, lasting 7 beats, 11 SVT episodes with the longest episode lasting 20 beats, average heart rate 148 bpm.  Currently not on any beta-blocker.  Case discussed with Dr. Armida Lander who recommended Lopressor  12.5 mg BID PRN for palpitations.  Recent lab work overall unremarkable.  Continue current medication regimen.  Discussed to avoid/limit caffeine/nicotine  use-see below.  Her healthy diet and regular cardiovascular exercise encouraged.  ED precautions discussed.  3. COPD, tobacco use Has had recent hospitalizations for COPD exacerbation.  Continues to smoke, has weaned down to now smoking only few cigarettes per day.  I congratulated her.  Smoking cessation encouraged and discussed. Continue to follow-up with Pulmonologist.   Disposition: Follow up in 6 months with Armida Lander, MD or APP.  Signed, Lasalle Pointer, NP 05/05/2024, 8:48 AM [facility]

## 2024-05-05 NOTE — Telephone Encounter (Signed)
*  STAT* If patient is at the pharmacy, call can be transferred to refill team.   1. Which medications need to be refilled? (please list name of each medication and dose if known) midodrine  (PROAMATINE ) 2.5 MG tablet    2. Would you like to learn more about the convenience, safety, & potential cost savings by using the [facility]?   3. Are you open to using the [facility] (Type [facility]   4. Which pharmacy/location (including street and city if local pharmacy) is medication to be sent to? [facility] [address], [place] - [address]    5. Do they need a 30 day or 90 day supply? 90 day

## 2024-05-05 NOTE — Telephone Encounter (Signed)
Done in today's visit

## 2024-05-05 NOTE — Patient Instructions (Addendum)
 Medication Instructions:  Your physician has recommended you make the following change in your medication:  Please start Midodrine  2.5 Mg Three times daily   Labwork: None   Testing/Procedures: None   Follow-Up: Your physician recommends that you schedule a follow-up appointment in:  1-2 weeks for a Blood Pressure check  6 weeks   Any Other Special Instructions Will Be Listed Below (If Applicable).  If you need a refill on your cardiac medications before your next appointment, please call your pharmacy.  (Patient Name)-_____________________________  (MRN)-_________________________     (Physician)-_________________________________   (DATE) -_________ (Blood Pressure)-_________  (Heart Rate)-_________    (DATE) -_________ (Blood Pressure)-_________  (Heart Rate)-_________    (DATE) -_________ (Blood Pressure)-_________  (Heart Rate)-_________   (DATE) -_________ (Blood Pressure)-_________  (Heart Rate)-_________    (DATE) -_________ (Blood Pressure)-_________  (Heart Rate)-_________    (DATE) -_________ (Blood Pressure)-_________  (Heart Rate)-_________   (DATE) -_________ (Blood Pressure)-_________  (Heart Rate)-_________    (DATE) -_________ (Blood Pressure)-_________  (Heart Rate)-_________    (DATE) -_________ (Blood Pressure)-_________  (Heart Rate)-_________    (DATE) -_________ (Blood Pressure)-_________  (Heart Rate)-_________    (DATE) -_________ (Blood Pressure)-_________  (Heart Rate)-_________    (DATE) -_________ (Blood Pressure)-_________  (Heart Rate)-_________    (DATE) -_________ (Blood Pressure)-_________  (Heart Rate)-_________    (DATE) -_________ (Blood Pressure)-_________  (Heart Rate)-_________

## 2024-05-06 ENCOUNTER — Encounter: Payer: Medicare HMO | Admitting: Family Medicine

## 2024-05-11 DIAGNOSIS — R062 Wheezing: Secondary | ICD-10-CM | POA: Diagnosis not present

## 2024-05-11 DIAGNOSIS — J449 Chronic obstructive pulmonary disease, unspecified: Secondary | ICD-10-CM | POA: Diagnosis not present

## 2024-05-13 ENCOUNTER — Telehealth: Payer: Self-pay

## 2024-05-13 ENCOUNTER — Ambulatory Visit: Attending: Internal Medicine

## 2024-05-13 VITALS — BP 92/72 | HR 86 | Ht 64.0 in | Wt 115.6 lb

## 2024-05-13 DIAGNOSIS — I959 Hypotension, unspecified: Secondary | ICD-10-CM

## 2024-05-13 NOTE — Telephone Encounter (Signed)
-----   Message from Almarie Crate sent at 05/13/2024  4:23 PM EDT ----- Thank you for seeing her. Please increase Midodrine  to 5 mg TID and repeat BP check in 1 week.   Thanks!   Best, Almarie Crate, NP ----- Message ----- From: Johnnye Littie HERO, CMA Sent: 05/13/2024   4:09 PM EDT To: Almarie Crate, NP

## 2024-05-13 NOTE — Progress Notes (Signed)
 Patient here for BP check. Reported no symptoms, stated that she she has taken all her medications today. I advised her that will send vitals to [facility]. No other issues to report.

## 2024-05-13 NOTE — Telephone Encounter (Signed)
 Unable to leave to leave message

## 2024-05-29 ENCOUNTER — Other Ambulatory Visit: Payer: Self-pay | Admitting: Adult Health

## 2024-05-29 DIAGNOSIS — F410 Panic disorder [episodic paroxysmal anxiety] without agoraphobia: Secondary | ICD-10-CM

## 2024-05-29 DIAGNOSIS — F411 Generalized anxiety disorder: Secondary | ICD-10-CM

## 2024-05-29 NOTE — Telephone Encounter (Signed)
 Was due for FU in June. Sent MyChart message to schedule an appt.

## 2024-05-29 NOTE — Telephone Encounter (Signed)
 Pt made an appt for 06/17/24

## 2024-06-02 ENCOUNTER — Telehealth: Payer: Self-pay | Admitting: Nurse Practitioner

## 2024-06-02 DIAGNOSIS — I959 Hypotension, unspecified: Secondary | ICD-10-CM

## 2024-06-02 DIAGNOSIS — I951 Orthostatic hypotension: Secondary | ICD-10-CM

## 2024-06-02 MED ORDER — MIDODRINE HCL 5 MG PO TABS: 5.0000 mg | ORAL_TABLET | Freq: Three times a day (TID) | ORAL | 1 refills | Status: DC

## 2024-06-02 NOTE — Telephone Encounter (Signed)
 Patient informed and verbalized understanding of plan. Patient will bring in BP readings and patient will have BP checked at upcoming OV 7/28

## 2024-06-02 NOTE — Telephone Encounter (Signed)
-----   Message from Almarie Crate sent at 05/13/2024  4:23 PM EDT ----- Thank you for seeing her. Please increase Midodrine  to 5 mg TID and repeat BP check in 1 week.   Thanks!   Best, Almarie Crate, NP ----- Message ----- From: Johnnye Littie HERO, CMA Sent: 05/13/2024   4:09 PM EDT To: Almarie Crate, NP

## 2024-06-05 ENCOUNTER — Encounter: Payer: Self-pay | Admitting: Family Medicine

## 2024-06-05 ENCOUNTER — Ambulatory Visit

## 2024-06-05 VITALS — BP 96/67 | HR 88 | Ht 64.0 in | Wt 116.0 lb

## 2024-06-05 DIAGNOSIS — J441 Chronic obstructive pulmonary disease with (acute) exacerbation: Secondary | ICD-10-CM | POA: Diagnosis not present

## 2024-06-05 MED ORDER — METHYLPREDNISOLONE ACETATE 80 MG/ML IJ SUSP
80.0000 mg | Freq: Once | INTRAMUSCULAR | Status: AC
  Administered 2024-06-05: 80 mg via INTRA_ARTICULAR

## 2024-06-05 MED ORDER — AMOXICILLIN-POT CLAVULANATE 875-125 MG PO TABS: 1.0000 | ORAL_TABLET | Freq: Two times a day (BID) | ORAL | 0 refills | Status: DC

## 2024-06-05 MED ORDER — PREDNISONE 20 MG PO TABS: ORAL_TABLET | ORAL | 0 refills | Status: DC

## 2024-06-05 NOTE — Progress Notes (Signed)
 BP 96/67   Pulse 88   Ht 5' 4 (1.626 m)   Wt 116 lb (52.6 kg)   SpO2 91%   BMI 19.91 kg/m    Subjective:   Patient ID: Alicia Sanchez, female    DOB: [birth year], 63 y.o.   MRN: [id number]  HPI: Alicia Sanchez is a 63 y.o. female presenting on 06/05/2024 for URI   HPI Cough and congestion and wheezing Patient is coming in today because she has been having cough and congestion and wheezing that started 2 days ago and has developed into a productive cough.  She has been using her albuterol  and nebulizers and her Breztri  and she feels like they are just not helping as much as they were before.  She has been using some Mucinex  DM as well.  She does feel some chills but no fevers.  Relevant past medical, surgical, family and social history reviewed and updated as indicated. Interim medical history since our last visit reviewed. Allergies and medications reviewed and updated.  Review of Systems  Constitutional:  Positive for chills. Negative for fever.  HENT:  Positive for congestion, postnasal drip and rhinorrhea. Negative for ear discharge, ear pain, sinus pressure, sneezing and sore throat.   Eyes:  Negative for pain, redness and visual disturbance.  Respiratory:  Positive for cough and wheezing. Negative for chest tightness and shortness of breath.   Cardiovascular:  Negative for chest pain and leg swelling.  Genitourinary:  Negative for difficulty urinating and dysuria.  Musculoskeletal:  Negative for back pain and gait problem.  Skin:  Negative for rash.  Neurological:  Negative for light-headedness and headaches.  Psychiatric/Behavioral:  Negative for agitation and behavioral problems.   All other systems reviewed and are negative.   Per HPI unless specifically indicated above   Allergies as of 06/05/2024       Reactions   Doxycycline  Other (See Comments)   Chest pain        Medication List        Accurate as of June 05, 2024 12:52 PM. If you have any questions,  ask your nurse or doctor.          acetaminophen  500 MG tablet Commonly known as: TYLENOL  Take 1,000 mg by mouth every 6 (six) hours as needed for moderate pain.   albuterol  108 (90 Base) MCG/ACT inhaler Commonly known as: VENTOLIN  HFA INHALE 2 PUFFS BY MOUTH EVERY 6 HOURS AS NEEDED FOR WHEEZING FOR SHORTNESS OF BREATH   alendronate  70 MG tablet Commonly known as: FOSAMAX  TAKE 1 TABLET EVERY 7 DAYS WITH A FULL GLASS OF WATER ON AN EMPTY STOMACH   ALPRAZolam  0.5 MG tablet Commonly known as: XANAX  Take 1 tablet by mouth 4 times daily   amoxicillin -clavulanate 875-125 MG tablet Commonly known as: AUGMENTIN  Take 1 tablet by mouth 2 (two) times daily. Started by: Fonda LABOR Ewen Varnell   aspirin  EC 81 MG tablet Take 81 mg by mouth daily.   atorvastatin  40 MG tablet Commonly known as: LIPITOR Do not restart until ok by primary care provider   Breztri  Aerosphere 160-9-4.8 MCG/ACT Aero inhaler Generic drug: budesonide -glycopyrrolate-formoterol  Inhale 2 puffs into the lungs 2 (two) times daily.   CAL-MAG-ZINC PO Take 1 tablet by mouth daily.   cetirizine  10 MG tablet Commonly known as: ZYRTEC  Take 10 mg by mouth daily.   DULoxetine  60 MG capsule Commonly known as: CYMBALTA  Take 2 capsules (120 mg total) by mouth daily.   ipratropium-albuterol  0.5-2.5 (3) MG/3ML Soln Commonly known  as: DUONEB INHALE THE CONTENTS OF 1 VIAL VIA NEBULIZER EVERY 6 HOURS AS NEEDED What changed: See the new instructions.   meloxicam  7.5 MG tablet Commonly known as: MOBIC  Take 1 tablet (7.5 mg total) by mouth daily.   midodrine  5 MG tablet Commonly known as: PROAMATINE  Take 1 tablet (5 mg total) by mouth 3 (three) times daily with meals.   Nebulizer/Tubing/Mouthpiece Kit 1 Units by Does not apply route 4 (four) times daily. GIVE face mask and tubing for nebulizer. J44.1   nitroGLYCERIN  0.4 MG SL tablet Commonly known as: NITROSTAT  Place 1 tablet (0.4 mg total) under the tongue every 5  (five) minutes x 3 doses as needed for chest pain (if o relief after 2nd dose, proceed to the ED for an evaluation or call 911).   predniSONE  20 MG tablet Commonly known as: DELTASONE  2 po at same time daily for 5 days Started by: Fonda LABOR Modest Draeger   QUEtiapine  200 MG tablet Commonly known as: SEROQUEL  Take 1 tablet (200 mg total) by mouth at bedtime.   rOPINIRole  3 MG tablet Commonly known as: REQUIP  Take 1 tablet (3 mg total) by mouth at bedtime.   Vitamin D3 Maximum Strength 125 MCG (5000 UT) capsule Generic drug: Cholecalciferol Take 5,000 Units by mouth daily.         Objective:   BP 96/67   Pulse 88   Ht 5' 4 (1.626 m)   Wt 116 lb (52.6 kg)   SpO2 91%   BMI 19.91 kg/m   Wt Readings from Last 3 Encounters:  06/05/24 116 lb (52.6 kg)  05/13/24 115 lb 9.6 oz (52.4 kg)  05/05/24 114 lb 3.2 oz (51.8 kg)    Physical Exam Vitals and nursing note reviewed.  Constitutional:      General: She is not in acute distress.    Appearance: She is well-developed. She is not diaphoretic.  HENT:     Right Ear: Tympanic membrane, ear canal and external ear normal.     Left Ear: Tympanic membrane, ear canal and external ear normal.     Nose: Mucosal edema and rhinorrhea present.     Right Sinus: No maxillary sinus tenderness or frontal sinus tenderness.     Left Sinus: No maxillary sinus tenderness or frontal sinus tenderness.     Mouth/Throat:     Pharynx: Uvula midline. Posterior oropharyngeal erythema present. No oropharyngeal exudate.     Tonsils: No tonsillar abscesses.  Eyes:     Conjunctiva/sclera: Conjunctivae normal.     Pupils: Pupils are equal, round, and reactive to light.  Cardiovascular:     Rate and Rhythm: Normal rate and regular rhythm.     Heart sounds: Normal heart sounds. No murmur heard. Pulmonary:     Effort: Pulmonary effort is normal. No respiratory distress.     Breath sounds: Wheezing present. No rhonchi or rales.  Musculoskeletal:         General: No tenderness. Normal range of motion.  Skin:    General: Skin is warm and dry.     Findings: No rash.  Neurological:     Mental Status: She is alert and oriented to person, place, and time.     Coordination: Coordination normal.  Psychiatric:        Behavior: Behavior normal.       Assessment & Plan:   Problem List Items Addressed This Visit       Respiratory   COPD with acute exacerbation (HCC) - Primary  Relevant Medications   amoxicillin -clavulanate (AUGMENTIN ) 875-125 MG tablet   predniSONE  (DELTASONE ) 20 MG tablet  Gave Depo-Medrol  intramuscular injection today along with a prescription for Augmentin  and prednisone  to go home with.  Continue her inhalers and the breathing worsens go to the emergency department.  Follow up plan: Return if symptoms worsen or fail to improve.  Counseling provided for all of the vaccine components No orders of the defined types were placed in this encounter.   Fonda Levins, MD [facility] 06/05/2024, 12:52 PM

## 2024-06-06 ENCOUNTER — Other Ambulatory Visit: Payer: Self-pay | Admitting: Family Medicine

## 2024-06-06 DIAGNOSIS — J432 Centrilobular emphysema: Secondary | ICD-10-CM

## 2024-06-10 DIAGNOSIS — J449 Chronic obstructive pulmonary disease, unspecified: Secondary | ICD-10-CM | POA: Diagnosis not present

## 2024-06-10 DIAGNOSIS — R062 Wheezing: Secondary | ICD-10-CM | POA: Diagnosis not present

## 2024-06-15 ENCOUNTER — Ambulatory Visit: Attending: Nurse Practitioner | Admitting: Nurse Practitioner

## 2024-06-15 ENCOUNTER — Encounter: Payer: Self-pay | Admitting: Nurse Practitioner

## 2024-06-15 ENCOUNTER — Ambulatory Visit: Admitting: Nurse Practitioner

## 2024-06-17 ENCOUNTER — Telehealth (INDEPENDENT_AMBULATORY_CARE_PROVIDER_SITE_OTHER): Payer: Self-pay | Admitting: Adult Health

## 2024-06-17 DIAGNOSIS — Z0389 Encounter for observation for other suspected diseases and conditions ruled out: Secondary | ICD-10-CM

## 2024-06-17 NOTE — Progress Notes (Signed)
 Patient no show appointment. Did not connect for video appt. Called patient and LVM to call and r/s appointment.

## 2024-06-22 ENCOUNTER — Emergency Department (HOSPITAL_COMMUNITY)

## 2024-06-22 ENCOUNTER — Inpatient Hospital Stay (HOSPITAL_COMMUNITY)
Admission: EM | Admit: 2024-06-22 | Discharge: 2024-06-24 | DRG: 189 | Disposition: A | Discharge status: Home / Self Care | Attending: Family Medicine | Admitting: Family Medicine

## 2024-06-22 ENCOUNTER — Other Ambulatory Visit: Payer: Self-pay

## 2024-06-22 ENCOUNTER — Ambulatory Visit: Payer: Self-pay | Admitting: Pulmonary Disease

## 2024-06-22 ENCOUNTER — Encounter (HOSPITAL_COMMUNITY): Payer: Self-pay | Admitting: *Deleted

## 2024-06-22 DIAGNOSIS — I700 Atherosclerosis of aorta: Secondary | ICD-10-CM | POA: Diagnosis present

## 2024-06-22 DIAGNOSIS — Z9981 Dependence on supplemental oxygen: Secondary | ICD-10-CM | POA: Diagnosis not present

## 2024-06-22 DIAGNOSIS — Z79899 Other long term (current) drug therapy: Secondary | ICD-10-CM

## 2024-06-22 DIAGNOSIS — F418 Other specified anxiety disorders: Secondary | ICD-10-CM | POA: Diagnosis present

## 2024-06-22 DIAGNOSIS — Z1152 Encounter for screening for COVID-19: Secondary | ICD-10-CM

## 2024-06-22 DIAGNOSIS — F410 Panic disorder [episodic paroxysmal anxiety] without agoraphobia: Secondary | ICD-10-CM | POA: Diagnosis not present

## 2024-06-22 DIAGNOSIS — F32A Depression, unspecified: Secondary | ICD-10-CM | POA: Diagnosis not present

## 2024-06-22 DIAGNOSIS — J9611 Chronic respiratory failure with hypoxia: Secondary | ICD-10-CM | POA: Diagnosis not present

## 2024-06-22 DIAGNOSIS — R739 Hyperglycemia, unspecified: Secondary | ICD-10-CM | POA: Diagnosis present

## 2024-06-22 DIAGNOSIS — Z7982 Long term (current) use of aspirin: Secondary | ICD-10-CM

## 2024-06-22 DIAGNOSIS — R059 Cough, unspecified: Secondary | ICD-10-CM | POA: Diagnosis not present

## 2024-06-22 DIAGNOSIS — J9621 Acute and chronic respiratory failure with hypoxia: Secondary | ICD-10-CM | POA: Diagnosis not present

## 2024-06-22 DIAGNOSIS — I951 Orthostatic hypotension: Secondary | ICD-10-CM | POA: Diagnosis not present

## 2024-06-22 DIAGNOSIS — J4489 Other specified chronic obstructive pulmonary disease: Secondary | ICD-10-CM | POA: Diagnosis not present

## 2024-06-22 DIAGNOSIS — Z881 Allergy status to other antibiotic agents status: Secondary | ICD-10-CM

## 2024-06-22 DIAGNOSIS — Z833 Family history of diabetes mellitus: Secondary | ICD-10-CM | POA: Diagnosis not present

## 2024-06-22 DIAGNOSIS — G2581 Restless legs syndrome: Secondary | ICD-10-CM | POA: Diagnosis present

## 2024-06-22 DIAGNOSIS — K219 Gastro-esophageal reflux disease without esophagitis: Secondary | ICD-10-CM | POA: Diagnosis present

## 2024-06-22 DIAGNOSIS — Z825 Family history of asthma and other chronic lower respiratory diseases: Secondary | ICD-10-CM

## 2024-06-22 DIAGNOSIS — I2510 Atherosclerotic heart disease of native coronary artery without angina pectoris: Secondary | ICD-10-CM | POA: Diagnosis present

## 2024-06-22 DIAGNOSIS — Z8701 Personal history of pneumonia (recurrent): Secondary | ICD-10-CM | POA: Diagnosis not present

## 2024-06-22 DIAGNOSIS — R6510 Systemic inflammatory response syndrome (SIRS) of non-infectious origin without acute organ dysfunction: Secondary | ICD-10-CM | POA: Diagnosis not present

## 2024-06-22 DIAGNOSIS — Z7951 Long term (current) use of inhaled steroids: Secondary | ICD-10-CM

## 2024-06-22 DIAGNOSIS — F419 Anxiety disorder, unspecified: Secondary | ICD-10-CM | POA: Diagnosis present

## 2024-06-22 DIAGNOSIS — M810 Age-related osteoporosis without current pathological fracture: Secondary | ICD-10-CM | POA: Diagnosis present

## 2024-06-22 DIAGNOSIS — R000 Tachycardia, unspecified: Secondary | ICD-10-CM | POA: Diagnosis not present

## 2024-06-22 DIAGNOSIS — J441 Chronic obstructive pulmonary disease with (acute) exacerbation: Principal | ICD-10-CM | POA: Diagnosis present

## 2024-06-22 DIAGNOSIS — R0602 Shortness of breath: Secondary | ICD-10-CM | POA: Diagnosis not present

## 2024-06-22 DIAGNOSIS — R0789 Other chest pain: Secondary | ICD-10-CM | POA: Diagnosis not present

## 2024-06-22 DIAGNOSIS — J479 Bronchiectasis, uncomplicated: Secondary | ICD-10-CM | POA: Diagnosis not present

## 2024-06-22 DIAGNOSIS — Z90710 Acquired absence of both cervix and uterus: Secondary | ICD-10-CM

## 2024-06-22 DIAGNOSIS — F17210 Nicotine dependence, cigarettes, uncomplicated: Secondary | ICD-10-CM | POA: Diagnosis present

## 2024-06-22 LAB — URINALYSIS, W/ REFLEX TO CULTURE (INFECTION SUSPECTED)
Bacteria, UA: NONE SEEN
Bilirubin Urine: NEGATIVE
Glucose, UA: NEGATIVE mg/dL
Ketones, ur: NEGATIVE mg/dL
Nitrite: NEGATIVE
Protein, ur: NEGATIVE mg/dL
Specific Gravity, Urine: 1.005 (ref 1.005–1.030)
pH: 6 (ref 5.0–8.0)

## 2024-06-22 LAB — LACTIC ACID, PLASMA: Lactic Acid, Venous: 1.2 mmol/L (ref 0.5–1.9)

## 2024-06-22 LAB — COMPREHENSIVE METABOLIC PANEL WITH GFR
ALT: 28 U/L (ref 0–44)
AST: 28 U/L (ref 15–41)
Albumin: 3.9 g/dL (ref 3.5–5.0)
Alkaline Phosphatase: 72 U/L (ref 38–126)
Anion gap: 12 (ref 5–15)
BUN: 12 mg/dL (ref 8–23)
CO2: 24 mmol/L (ref 22–32)
Calcium: 9.1 mg/dL (ref 8.9–10.3)
Chloride: 102 mmol/L (ref 98–111)
Creatinine, Ser: 0.81 mg/dL (ref 0.44–1.00)
GFR, Estimated: >60 mL/min (ref >60)
Glucose, Bld: 103 mg/dL — ABNORMAL HIGH (ref 70–99)
Potassium: 4 mmol/L (ref 3.5–5.1)
Sodium: 138 mmol/L (ref 135–145)
Total Bilirubin: 0.5 mg/dL (ref 0.0–1.2)
Total Protein: 7.5 g/dL (ref 6.5–8.1)

## 2024-06-22 LAB — CBC WITH DIFFERENTIAL/PLATELET
Abs Immature Granulocytes: 0.09 K/uL — ABNORMAL HIGH (ref 0.00–0.07)
Basophils Absolute: 0.1 K/uL (ref 0.0–0.1)
Basophils Relative: 0 %
Eosinophils Absolute: 0 K/uL (ref 0.0–0.5)
Eosinophils Relative: 0 %
HCT: 47.1 % — ABNORMAL HIGH (ref 36.0–46.0)
Hemoglobin: 16 g/dL — ABNORMAL HIGH (ref 12.0–15.0)
Immature Granulocytes: 0 %
Lymphocytes Relative: 10 %
Lymphs Abs: 2.2 K/uL (ref 0.7–4.0)
MCH: 31.6 pg (ref 26.0–34.0)
MCHC: 34 g/dL (ref 30.0–36.0)
MCV: 93.1 fL (ref 80.0–100.0)
Monocytes Absolute: 1.4 K/uL — ABNORMAL HIGH (ref 0.1–1.0)
Monocytes Relative: 6 %
Neutro Abs: 18.5 K/uL — ABNORMAL HIGH (ref 1.7–7.7)
Neutrophils Relative %: 84 %
Platelets: 333 K/uL (ref 150–400)
RBC: 5.06 MIL/uL (ref 3.87–5.11)
RDW: 13.2 % (ref 11.5–15.5)
WBC: 22.2 K/uL — ABNORMAL HIGH (ref 4.0–10.5)
nRBC: 0 % (ref 0.0–0.2)

## 2024-06-22 LAB — RESP PANEL BY RT-PCR (RSV, FLU A&B, COVID)  RVPGX2
Influenza A by PCR: NEGATIVE
Influenza B by PCR: NEGATIVE
Resp Syncytial Virus by PCR: NEGATIVE
SARS Coronavirus 2 by RT PCR: NEGATIVE

## 2024-06-22 LAB — PROCALCITONIN: Procalcitonin: <0.1 ng/mL

## 2024-06-22 MED ORDER — MIDODRINE HCL 5 MG PO TABS
5.0000 mg | ORAL_TABLET | Freq: Three times a day (TID) | ORAL | Status: DC
Start: 2024-06-23 — End: 2024-06-24
  Administered 2024-06-23 (×3): 5 mg via ORAL
  Filled 2024-06-22 (×4): qty 1

## 2024-06-22 MED ORDER — SODIUM CHLORIDE 0.9 % IV SOLN
1.0000 g | INTRAVENOUS | Status: DC
  Administered 2024-06-23: 1 g via INTRAVENOUS
  Filled 2024-06-22: qty 10

## 2024-06-22 MED ORDER — LACTATED RINGERS IV BOLUS (SEPSIS)
1000.0000 mL | Freq: Once | INTRAVENOUS | Status: AC
  Administered 2024-06-22: 1000 mL via INTRAVENOUS

## 2024-06-22 MED ORDER — PREDNISONE 20 MG PO TABS
40.0000 mg | ORAL_TABLET | Freq: Every day | ORAL | Status: DC
  Administered 2024-06-24: 40 mg via ORAL
  Filled 2024-06-22: qty 2

## 2024-06-22 MED ORDER — ROPINIROLE HCL 1 MG PO TABS
3.0000 mg | ORAL_TABLET | Freq: Every day | ORAL | Status: DC
  Administered 2024-06-23 (×2): 3 mg via ORAL
  Filled 2024-06-22 (×2): qty 3

## 2024-06-22 MED ORDER — IOHEXOL 350 MG/ML SOLN
75.0000 mL | Freq: Once | INTRAVENOUS | Status: AC | PRN
  Administered 2024-06-22: 75 mL via INTRAVENOUS

## 2024-06-22 MED ORDER — POLYETHYLENE GLYCOL 3350 17 G PO PACK: 17.0000 g | PACK | Freq: Every day | ORAL | Status: DC | PRN

## 2024-06-22 MED ORDER — METHYLPREDNISOLONE SODIUM SUCC 125 MG IJ SOLR
125.0000 mg | Freq: Two times a day (BID) | INTRAMUSCULAR | Status: AC
  Administered 2024-06-23 (×2): 125 mg via INTRAVENOUS
  Filled 2024-06-22 (×2): qty 2

## 2024-06-22 MED ORDER — ACETAMINOPHEN 650 MG RE SUPP: 650.0000 mg | Freq: Four times a day (QID) | RECTAL | Status: DC | PRN

## 2024-06-22 MED ORDER — QUETIAPINE FUMARATE 100 MG PO TABS
200.0000 mg | ORAL_TABLET | Freq: Every day | ORAL | Status: DC
  Administered 2024-06-23 (×2): 200 mg via ORAL
  Filled 2024-06-22 (×2): qty 2

## 2024-06-22 MED ORDER — METHYLPREDNISOLONE SODIUM SUCC 125 MG IJ SOLR
125.0000 mg | Freq: Once | INTRAMUSCULAR | Status: AC
  Administered 2024-06-22: 125 mg via INTRAVENOUS
  Filled 2024-06-22: qty 2

## 2024-06-22 MED ORDER — ACETAMINOPHEN 500 MG PO TABS
1000.0000 mg | ORAL_TABLET | Freq: Once | ORAL | Status: AC
  Administered 2024-06-22: 1000 mg via ORAL
  Filled 2024-06-22: qty 2

## 2024-06-22 MED ORDER — LACTATED RINGERS IV BOLUS: 1000.0000 mL | Freq: Once | INTRAVENOUS | Status: DC

## 2024-06-22 MED ORDER — ATORVASTATIN CALCIUM 40 MG PO TABS
40.0000 mg | ORAL_TABLET | Freq: Every day | ORAL | Status: DC
Start: 2024-06-23 — End: 2024-06-24
  Administered 2024-06-23 – 2024-06-24 (×2): 40 mg via ORAL
  Filled 2024-06-22 (×2): qty 1

## 2024-06-22 MED ORDER — OXYCODONE HCL 5 MG PO TABS: 5.0000 mg | ORAL_TABLET | ORAL | Status: DC | PRN

## 2024-06-22 MED ORDER — LACTATED RINGERS IV BOLUS (SEPSIS)
500.0000 mL | Freq: Once | INTRAVENOUS | Status: AC
  Administered 2024-06-22: 500 mL via INTRAVENOUS

## 2024-06-22 MED ORDER — ONDANSETRON HCL 4 MG PO TABS: 4.0000 mg | ORAL_TABLET | Freq: Four times a day (QID) | ORAL | Status: DC | PRN

## 2024-06-22 MED ORDER — SODIUM CHLORIDE 0.9% FLUSH
3.0000 mL | Freq: Two times a day (BID) | INTRAVENOUS | Status: DC
  Administered 2024-06-23 – 2024-06-24 (×3): 3 mL via INTRAVENOUS

## 2024-06-22 MED ORDER — IPRATROPIUM-ALBUTEROL 0.5-2.5 (3) MG/3ML IN SOLN
9.0000 mL | Freq: Once | RESPIRATORY_TRACT | Status: AC
  Administered 2024-06-22: 9 mL via RESPIRATORY_TRACT
  Filled 2024-06-22: qty 3

## 2024-06-22 MED ORDER — DULOXETINE HCL 60 MG PO CPEP
120.0000 mg | ORAL_CAPSULE | Freq: Every day | ORAL | Status: DC
Start: 2024-06-23 — End: 2024-06-24
  Administered 2024-06-23 – 2024-06-24 (×2): 120 mg via ORAL
  Filled 2024-06-22 (×2): qty 2

## 2024-06-22 MED ORDER — SODIUM CHLORIDE 0.9 % IV SOLN
500.0000 mg | Freq: Once | INTRAVENOUS | Status: AC
  Administered 2024-06-22: 500 mg via INTRAVENOUS
  Filled 2024-06-22: qty 5

## 2024-06-22 MED ORDER — IPRATROPIUM-ALBUTEROL 0.5-2.5 (3) MG/3ML IN SOLN: 3.0000 mL | RESPIRATORY_TRACT | Status: DC | PRN

## 2024-06-22 MED ORDER — LACTATED RINGERS IV BOLUS (SEPSIS)
250.0000 mL | Freq: Once | INTRAVENOUS | Status: AC
  Administered 2024-06-22: 250 mL via INTRAVENOUS

## 2024-06-22 MED ORDER — ASPIRIN 81 MG PO TBEC
81.0000 mg | DELAYED_RELEASE_TABLET | Freq: Every day | ORAL | Status: DC
Start: 2024-06-23 — End: 2024-06-24
  Administered 2024-06-23 – 2024-06-24 (×2): 81 mg via ORAL
  Filled 2024-06-22 (×2): qty 1

## 2024-06-22 MED ORDER — ONDANSETRON HCL 4 MG/2ML IJ SOLN: 4.0000 mg | Freq: Four times a day (QID) | INTRAMUSCULAR | Status: DC | PRN

## 2024-06-22 MED ORDER — ALBUTEROL SULFATE (2.5 MG/3ML) 0.083% IN NEBU
5.0000 mg | INHALATION_SOLUTION | Freq: Once | RESPIRATORY_TRACT | Status: AC
  Administered 2024-06-22: 5 mg via RESPIRATORY_TRACT
  Filled 2024-06-22: qty 6

## 2024-06-22 MED ORDER — ACETAMINOPHEN 325 MG PO TABS
650.0000 mg | ORAL_TABLET | Freq: Four times a day (QID) | ORAL | Status: DC | PRN
  Administered 2024-06-23: 650 mg via ORAL
  Filled 2024-06-22: qty 2

## 2024-06-22 MED ORDER — IPRATROPIUM-ALBUTEROL 0.5-2.5 (3) MG/3ML IN SOLN
RESPIRATORY_TRACT | Status: AC
  Filled 2024-06-22: qty 6

## 2024-06-22 MED ORDER — ENOXAPARIN SODIUM 40 MG/0.4ML IJ SOSY
40.0000 mg | PREFILLED_SYRINGE | INTRAMUSCULAR | Status: DC
  Administered 2024-06-23: 40 mg via SUBCUTANEOUS
  Filled 2024-06-22: qty 0.4

## 2024-06-22 MED ORDER — ALPRAZOLAM 0.5 MG PO TABS
0.5000 mg | ORAL_TABLET | Freq: Four times a day (QID) | ORAL | Status: DC
  Administered 2024-06-23 – 2024-06-24 (×6): 0.5 mg via ORAL
  Filled 2024-06-22 (×6): qty 1

## 2024-06-22 MED ORDER — SODIUM CHLORIDE 0.9 % IV SOLN
2.0000 g | Freq: Once | INTRAVENOUS | Status: AC
  Administered 2024-06-22: 2 g via INTRAVENOUS
  Filled 2024-06-22: qty 20

## 2024-06-22 NOTE — Sepsis Progress Note (Signed)
 Following for sepsis monitoring ?

## 2024-06-22 NOTE — H&P (Signed)
 History and Physical    Alicia Sanchez FMW:[id number] DOB: [birth year] DOA: 06/22/2024  PCP: Jolinda Norene HERO, DO   Patient coming from: Home   Chief Complaint: Increased cough and SOB   HPI: Alicia Sanchez is a 63 y.o. female with medical history significant for COPD, chronic hypoxic respiratory failure, depression, anxiety, restless leg syndrome, and mild nonobstructive CAD who presents with increased cough and shortness of breath.  Patient reports increased shortness of breath, increased cough, and increased production of thick colored sputum.  She also developed a fever yesterday.  She states that she had just recently completed a course of steroids and antibiotics but continued to worsen despite this.  She describes a tight sensation in her chest which she often experiences with acute respiratory illness.  ED Course: Upon arrival to the ED, patient is found to have an oral temp of 37.8 C and oxygen  saturations in the mid to upper 90s on 3 L/min supplemental oxygen  with mild tachypnea, tachycardia, and stable BP.  Labs are most notable for normal creatinine and WBC 22,200 with normal lactate.  CTA chest reveals emphysematous changes but no PE.    Blood cultures were collected in the ED and the patient was treated with 1.75 L of LR, acetaminophen , Rocephin , azithromycin , IV Solu-Medrol , DuoNeb, and additional albuterol .  Review of Systems:  All other systems reviewed and apart from HPI, are negative.  Past Medical History:  Diagnosis Date   Anxiety    Aortic atherosclerosis (HCC)    Chronic kidney disease    COPD (chronic obstructive pulmonary disease) (HCC)    Depression    Headache    History of chest pain    History of syncope    Hypotension    Osteoporosis    Palpitations    Panic attacks    Pneumonia    Restless legs syndrome (RLS)     Past Surgical History:  Procedure Laterality Date   ABDOMINAL HYSTERECTOMY     BIOPSY  05/20/2023   Procedure: BIOPSY;  Surgeon:  Legrand Victory LITTIE DOUGLAS, MD;  Location: [facility];  Service: Gastroenterology;;   COLONOSCOPY WITH PROPOFOL  N/A 05/20/2023   Procedure: COLONOSCOPY WITH PROPOFOL ;  Surgeon: Legrand Victory LITTIE DOUGLAS, MD;  Location: [facility];  Service: Gastroenterology;  Laterality: N/A;   ESOPHAGOGASTRODUODENOSCOPY (EGD) WITH PROPOFOL  N/A 05/20/2023   Procedure: ESOPHAGOGASTRODUODENOSCOPY (EGD) WITH PROPOFOL ;  Surgeon: Legrand Victory LITTIE DOUGLAS, MD;  Location: [facility];  Service: Gastroenterology;  Laterality: N/A;    Social History:   reports that she has been smoking cigarettes. She has a 21 pack-year smoking history. She has never used smokeless tobacco. She reports current alcohol use. She reports current drug use. Frequency: 1.00 time per week. Drug: Marijuana.  Allergies  Allergen Reactions   Doxycycline  Other (See Comments)    Chest pain    Family History  Problem Relation Age of Onset   COPD Mother    COPD Father    Diabetes type II Sister    Kidney failure Sister    Diabetes type II Brother    COPD Maternal Grandfather    Breast cancer Neg Hx    Liver disease Neg Hx    Esophageal cancer Neg Hx    Colon cancer Neg Hx      Prior to Admission medications   Medication Sig Start Date End Date Taking? Authorizing Provider  acetaminophen  (TYLENOL ) 500 MG tablet Take 1,000 mg by mouth every 6 (six) hours as needed for moderate pain.   Yes [provider]  albuterol  (VENTOLIN  HFA) 108 (90 Base) MCG/ACT inhaler INHALE 2 PUFFS BY MOUTH EVERY 6 HOURS AS NEEDED FOR WHEEZING FOR SHORTNESS OF BREATH 06/08/24  Yes Gottschalk, Ashly M, DO  alendronate  (FOSAMAX ) 70 MG tablet TAKE 1 TABLET EVERY 7 DAYS WITH A FULL GLASS OF WATER ON AN EMPTY STOMACH 01/17/24  Yes Jolinda Potter M, DO  ALPRAZolam  (XANAX ) 0.5 MG tablet Take 1 tablet by mouth 4 times daily 05/29/24  Yes Mozingo, Regina Nattalie, NP  aspirin  EC 81 MG tablet Take 81 mg by mouth daily.   Yes [provider]  atorvastatin  (LIPITOR) 40 MG  tablet Do not restart until ok by primary care provider 04/21/24  Yes Tat, Alm, MD  budeson-glycopyrrolate-formoterol  (BREZTRI  AEROSPHERE) 160-9-4.8 MCG/ACT AERO Inhale 2 puffs into the lungs 2 (two) times daily. 02/11/24  Yes Jolinda Potter M, DO  Calcium -Magnesium -Zinc (CAL-MAG-ZINC PO) Take 1 tablet by mouth daily.   Yes [provider]  cetirizine  (ZYRTEC ) 10 MG tablet Take 10 mg by mouth daily. 11/20/23  Yes [provider]  Cholecalciferol (VITAMIN D3 MAXIMUM STRENGTH) 125 MCG (5000 UT) capsule Take 5,000 Units by mouth daily.   Yes [provider]  DULoxetine  (CYMBALTA ) 60 MG capsule Take 2 capsules (120 mg total) by mouth daily. 01/17/24  Yes Gottschalk, Ashly M, DO  ipratropium-albuterol  (DUONEB) 0.5-2.5 (3) MG/3ML SOLN INHALE THE CONTENTS OF 1 VIAL VIA NEBULIZER EVERY 6 HOURS AS NEEDED Patient taking differently: Take 3 mLs by nebulization every 4 (four) hours as needed (Shortness of breath). 03/01/23  Yes Mannam, Praveen, MD  midodrine  (PROAMATINE ) 5 MG tablet Take 1 tablet (5 mg total) by mouth 3 (three) times daily with meals. 06/02/24  Yes Miriam Norris, NP  nitroGLYCERIN  (NITROSTAT ) 0.4 MG SL tablet Place 1 tablet (0.4 mg total) under the tongue every 5 (five) minutes x 3 doses as needed for chest pain (if o relief after 2nd dose, proceed to the ED for an evaluation or call 911). 01/28/23  Yes Miriam Norris, NP  QUEtiapine  (SEROQUEL ) 200 MG tablet Take 1 tablet (200 mg total) by mouth at bedtime. 02/04/24  Yes Mozingo, Regina Nattalie, NP  rOPINIRole  (REQUIP ) 3 MG tablet Take 1 tablet (3 mg total) by mouth at bedtime. 01/17/24  Yes Jolinda Potter HERO, DO  Respiratory Therapy Supplies (NEBULIZER/TUBING/MOUTHPIECE) KIT 1 Units by Does not apply route 4 (four) times daily. GIVE face mask and tubing for nebulizer. J44.1 11/21/21   Jolinda Potter HERO, DO    Physical Exam: Vitals:   06/22/24 2045 06/22/24 2130 06/22/24 2132 06/22/24 2145  BP: 118/70 100/68  98/80   Pulse: (!) 102 100  (!) 101  Resp: (!) 23 (!) 27  (!) 25  Temp:      TempSrc:      SpO2: 99% 98% 95% 100%  Weight:      Height:        Constitutional: NAD, calm  Eyes: PERTLA, lids and conjunctivae normal ENMT: Mucous membranes are moist. Posterior pharynx clear of any exudate or lesions.   Neck: supple, no masses  Respiratory: Speaking full sentences. Tachypneic. Diminished bilaterally, prolonged expiratory phase.   Cardiovascular: S1 & S2 heard, regular rate and rhythm. No extremity edema.   Abdomen: No tenderness, soft. Bowel sounds active.  Musculoskeletal: no clubbing / cyanosis. No joint deformity upper and lower extremities.   Skin: no significant rashes, lesions, ulcers. Warm, dry, well-perfused. Neurologic: Dysconjugate gaze. Moving all extremities. Alert and oriented.  Psychiatric: Calm. Cooperative.  Labs and Imaging on Admission: I have personally reviewed following labs and imaging studies  CBC: Recent Labs  Lab 06/22/24 1941  WBC 22.2*  NEUTROABS 18.5*  HGB 16.0*  HCT 47.1*  MCV 93.1  PLT 333   Basic Metabolic Panel: Recent Labs  Lab 06/22/24 1941  NA 138  K 4.0  CL 102  CO2 24  GLUCOSE 103*  BUN 12  CREATININE 0.81  CALCIUM  9.1   GFR: Estimated Creatinine Clearance: 58 mL/min (by C-G formula based on SCr of 0.81 mg/dL). Liver Function Tests: Recent Labs  Lab 06/22/24 1941  AST 28  ALT 28  ALKPHOS 72  BILITOT 0.5  PROT 7.5  ALBUMIN 3.9   No results for input(s): LIPASE, AMYLASE in the last 168 hours. No results for input(s): AMMONIA in the last 168 hours. Coagulation Profile: No results for input(s): INR, PROTIME in the last 168 hours. Cardiac Enzymes: No results for input(s): CKTOTAL, CKMB, CKMBINDEX, TROPONINI in the last 168 hours. BNP (last 3 results) No results for input(s): PROBNP in the last 8760 hours. HbA1C: No results for input(s): HGBA1C in the last 72 hours. CBG: No results for input(s):  GLUCAP in the last 168 hours. Lipid Profile: No results for input(s): CHOL, HDL, LDLCALC, TRIG, CHOLHDL, LDLDIRECT in the last 72 hours. Thyroid  Function Tests: No results for input(s): TSH, T4TOTAL, FREET4, T3FREE, THYROIDAB in the last 72 hours. Anemia Panel: No results for input(s): VITAMINB12, FOLATE, FERRITIN, TIBC, IRON, RETICCTPCT in the last 72 hours. Urine analysis:    Component Value Date/Time   COLORURINE YELLOW 06/22/2024 1951   APPEARANCEUR CLEAR 06/22/2024 1951   APPEARANCEUR Clear 04/28/2024 1445   LABSPEC 1.005 06/22/2024 1951   PHURINE 6.0 06/22/2024 1951   GLUCOSEU NEGATIVE 06/22/2024 1951   HGBUR MODERATE (A) 06/22/2024 1951   BILIRUBINUR NEGATIVE 06/22/2024 1951   BILIRUBINUR Negative 04/28/2024 1445   KETONESUR NEGATIVE 06/22/2024 1951   PROTEINUR NEGATIVE 06/22/2024 1951   UROBILINOGEN 0.2 12/12/2012 1945   NITRITE NEGATIVE 06/22/2024 1951   LEUKOCYTESUR TRACE (A) 06/22/2024 1951   Sepsis Labs: @LABRCNTIP (procalcitonin:4,lacticidven:4) ) Recent Results (from the past 240 hours)  Resp panel by RT-PCR (RSV, Flu A&B, Covid) Anterior Nasal Swab     Status: None   Collection Time: 06/22/24  7:45 PM   Specimen: Anterior Nasal Swab  Result Value Ref Range Status   SARS Coronavirus 2 by RT PCR NEGATIVE NEGATIVE Final    Comment: (NOTE) SARS-CoV-2 target nucleic acids are NOT DETECTED.  The SARS-CoV-2 RNA is generally detectable in upper respiratory specimens during the acute phase of infection. The lowest concentration of SARS-CoV-2 viral copies this assay can detect is 138 copies/mL. A negative result does not preclude SARS-Cov-2 infection and should not be used as the sole basis for treatment or other patient management decisions. A negative result may occur with  improper specimen collection/handling, submission of specimen other than nasopharyngeal swab, presence of viral mutation(s) within the areas targeted by this  assay, and inadequate number of viral copies(<138 copies/mL). A negative result must be combined with clinical observations, patient history, and epidemiological information. The expected result is Negative.  Fact Sheet for Patients:  BloggerCourse.com  Fact Sheet for Healthcare Providers:  SeriousBroker.it  This test is no t yet approved or cleared by the [place]  FDA and  has been authorized for detection and/or diagnosis of SARS-CoV-2 by FDA under an Emergency Use Authorization (EUA). This EUA will remain  in effect (meaning this test can be used) for the  duration of the COVID-19 declaration under Section 564(b)(1) of the Act, 21 U.S.C.section 360bbb-3(b)(1), unless the authorization is terminated  or revoked sooner.       Influenza A by PCR NEGATIVE NEGATIVE Final   Influenza B by PCR NEGATIVE NEGATIVE Final    Comment: (NOTE) The Xpert Xpress SARS-CoV-2/FLU/RSV plus assay is intended as an aid in the diagnosis of influenza from Nasopharyngeal swab specimens and should not be used as a sole basis for treatment. Nasal washings and aspirates are unacceptable for Xpert Xpress SARS-CoV-2/FLU/RSV testing.  Fact Sheet for Patients: BloggerCourse.com  Fact Sheet for Healthcare Providers: SeriousBroker.it  This test is not yet approved or cleared by the [place]  FDA and has been authorized for detection and/or diagnosis of SARS-CoV-2 by FDA under an Emergency Use Authorization (EUA). This EUA will remain in effect (meaning this test can be used) for the duration of the COVID-19 declaration under Section 564(b)(1) of the Act, 21 U.S.C. section 360bbb-3(b)(1), unless the authorization is terminated or revoked.     Resp Syncytial Virus by PCR NEGATIVE NEGATIVE Final    Comment: (NOTE) Fact Sheet for Patients: BloggerCourse.com  Fact Sheet for  Healthcare Providers: SeriousBroker.it  This test is not yet approved or cleared by the [place]  FDA and has been authorized for detection and/or diagnosis of SARS-CoV-2 by FDA under an Emergency Use Authorization (EUA). This EUA will remain in effect (meaning this test can be used) for the duration of the COVID-19 declaration under Section 564(b)(1) of the Act, 21 U.S.C. section 360bbb-3(b)(1), unless the authorization is terminated or revoked.  Performed at [facility], [address] [place], [place] [address]      Radiological Exams on Admission: CT Angio Chest PE W and/or Wo Contrast Result Date: 06/22/2024 EXAM: CTA of the Chest with and without contrast for PE 06/22/2024 10:47:42 PM TECHNIQUE: CTA of the chest was performed without and with the administration of 75 mL of intravenous iohexol  (OMNIPAQUE ) 350 MG/ML. Multiplanar reformatted images are provided for review. MIP images are provided for review. Automated exposure control, iterative reconstruction, and/or weight based adjustment of the mA/kV was utilized to reduce the radiation dose to as low as reasonably achievable. COMPARISON: Same day radiograph and CTA chest. CLINICAL HISTORY: Pulmonary embolism (PE) suspected, high prob. Pt with fever since yesterday, states 100.4 today. Pt on home O2 at 2 L/M. Pt with cough, states it's not new. + chest tightness and in her back. FINDINGS: PULMONARY ARTERIES: Pulmonary arteries are adequately opacified for evaluation. Negative for pulmonary embolism. Main pulmonary artery is normal in caliber. MEDIASTINUM: The heart and pericardium demonstrate a small pericardial effusion, unchanged. Coronary artery calcification. There is no acute abnormality of the thoracic aorta. Normal caliber thoracic aorta without dissection. LYMPH NODES: No mediastinal, hilar or axillary lymphadenopathy. LUNGS AND PLEURA: Moderate emphysema. No focal consolidation or pulmonary edema. No  pleural effusion or pneumothorax. UPPER ABDOMEN: Limited images of the upper abdomen are unremarkable. SOFT TISSUES AND BONES: No acute bone or soft tissue abnormality. IMPRESSION: 1. No pulmonary embolism. 2. Moderate emphysema. Electronically signed by: Norman Gatlin MD 06/22/2024 11:29 PM EDT RP Workstation: [id number]   DG Chest 2 View Result Date: 06/22/2024 EXAM: 2 VIEW(S) XRAY OF THE CHEST 06/22/2024 07:04:29 PM COMPARISON: 04/18/2024 CLINICAL HISTORY: Fever/sob. Table formatting from the original note was not included.; Pt with fever since yesterday, states 100.4 today. Pt on home O2 at 2 L/M. Pt with cough, states it's not new. + chest tightness and in her back. FINDINGS:  LUNGS AND PLEURA: Hyperinflation and chronic bronchitic change. No focal consolidation, pleural effusion, or pneumothorax. HEART AND MEDIASTINUM: Stable cardiomediastinal silhouette. BONES AND SOFT TISSUES: No acute osseous abnormality. IMPRESSION: 1. No acute findings. 2. Emphysema. Electronically signed by: Norman Gatlin MD 06/22/2024 07:58 PM EDT RP Workstation: [id number]    EKG: Independently reviewed. Sinus tachycardia, rate 112, RAD.   Assessment/Plan   1. COPD exacerbation; chronic hypoxic respiratory failure  - Culture sputum, continue systemic steroid and antibiotic, continue short-acting bronchodilators, and continue supplemental O2    2. Depression, anxiety  - Cymbalta , Seroquel , Xanax     3. CAD - Mild, non-obstructive  - ASA, Lipitor    4. Orthostatic syncope - Started on midodrine  by cardiology in June  - Continue midodrine      DVT prophylaxis: Lovenox   Code Status: Full  Level of Care: Level of care: Telemetry Family Communication: None present   Disposition Plan:  Patient is from: Home  Anticipated d/c is to: Home  Anticipated d/c date is: 06/25/24  Patient currently: Pending improved respiratory status  Consults called: None Admission status: Inpatient     Evalene GORMAN Sprinkles, MD [facility]  06/22/2024, 11:52 PM

## 2024-06-22 NOTE — ED Triage Notes (Signed)
 Pt with fever since yesterday, states 100.4 today. Pt on home O2 at 2 L/M. Pt with cough, states it's not new. + chest tightness and in her back.

## 2024-06-22 NOTE — Telephone Encounter (Signed)
 FYI Only or Action Required?: FYI only for provider.  Patient is followed in Pulmonology for COPD, last seen on 11/28/2023 by Parrett, Alicia RAMAN, NP.  Called Nurse Triage reporting Cough and Chest Pain.  Symptoms began several weeks ago.  Interventions attempted: Prescription medications: abx/steroids, Rescue inhaler, and Maintenance inhaler.  Symptoms are: unchanged.  Triage Disposition: Go to ED Now (Notify PCP)  Patient/caregiver understands and will follow disposition?: Yes     Copied from CRM [id number]. Topic: Clinical - Red Word Triage >> Jun 22, 2024  4:47 PM Celestine FALCON wrote: Red Word that prompted transfer to Nurse Triage: Pt calling back after receiving 1 yr follow up recall. Pt stated she has been feeling under the weather for about 3 weeks with congestion, coughing, and coughing up dark yellow brown phlem. She stated she has already completed a round of sterioids and antibiotics (4 days since completion) and is feeling worse. Pt stated she has had a fever of 102 and is close to going to emegency room.  Pt sees Dr. [name] at [facility] clinic no appt made. Reason for Disposition  Chest pain  (Exception: MILD central chest pain, present only when coughing.)  Answer Assessment - Initial Assessment Questions E2C2 Pulmonary Triage - Initial Assessment Questions Chief Complaint (e.g., cough, sob, wheezing, fever, chills, sweat or additional symptoms) *Go to specific symptom protocol after initial questions. Productive yellow mucus, SOB, fever (tmax 100.4) Recently finished steroids/abx 4 days ago  How long have symptoms been present? 3 weeks  Have you tested for COVID or Flu? Note: If not, ask patient if a home test can be taken. If so, instruct patient to call back for positive results. No  MEDICINES:   Have you used any OTC meds to help with symptoms? No If yes, ask What medications? N/a  Have you used your inhalers/maintenance medication? Yes If yes, What  medications? BREZTRI  - twice a day DuoNeb NEB 3-4 x a day - provides minimal relief  If inhaler, ask How many puffs and how often? Note: Review instructions on medication in the chart. See above  OXYGEN : Do you wear supplemental oxygen ? Yes If yes, How many liters are you supposed to use? 2L PRN and at night  Do you monitor your oxygen  levels? Yes If yes, What is your reading (oxygen  level) today? 93 on RA  What is your usual oxygen  saturation reading?  (Note: Pulmonary O2 sats should be 90% or greater) N/a      1. ONSET: When did the cough begin?      See above 2. SEVERITY: How bad is the cough today?      See above 3. SPUTUM: Describe the color of your sputum (e.g., none, dry cough; clear, white, yellow, green)     See above 4. HEMOPTYSIS: Are you coughing up any blood? If Yes, ask: How much? (e.g., flecks, streaks, tablespoons, etc.)     Minimal streak of blood 5. DIFFICULTY BREATHING: Are you having difficulty breathing? If Yes, ask: How bad is it? (e.g., mild, moderate, severe)      Mild Tri[facility] does not appreciate audible SOB/wheezing during call. Pt is speaking in full sentences.  6. FEVER: Do you have a fever? If Yes, ask: What is your temperature, how was it measured, and when did it start?     See above 7. CARDIAC HISTORY: Do you have any history of heart disease? (e.g., heart attack, congestive heart failure)      Denies Endorses hx of low BP -  on midodrine  (PROAMATINE ) 8. LUNG HISTORY: Do you have any history of lung disease?  (e.g., pulmonary embolus, asthma, emphysema)     COPD 9. PE RISK FACTORS: Do you have a history of blood clots? (or: recent major surgery, recent prolonged travel, bedridden)     denies 10. OTHER SYMPTOMS: Do you have any other symptoms? (e.g., runny nose, wheezing, chest pain)       Nasal running - clear Intermittent CP likely r/t coughing? - reports cannot get deep breath 11. PREGNANCY: Is  there any chance you are pregnant? When was your last menstrual period?       N/a 12. TRAVEL: Have you traveled out of the country in the last month? (e.g., travel history, exposures)       N/a    Pt initially calling to schedule annual OV,  but was transferred to NT d/t being symptomatic.    Upon further investigation, pt has been having ongoing SOB/productive cough and is s/p abx and steroids (finished 4 days ago prescribed by PCP). Pt reports sx are still present and is experiencing CP. Tri[facility] advised ED for further evaluation/tx. Patient verbalized understanding and to call back/911 with worsening symptoms.  Protocols used: Cough - Acute Productive-A-AH

## 2024-06-22 NOTE — ED Provider Notes (Signed)
 [place] EMERGENCY DEPARTMENT AT [facility] Provider Note   CSN: [id number] Arrival date & time: 06/22/24  8195     Patient presents with: Fever   Alicia Sanchez is a 63 y.o. female.  {Add pertinent medical, surgical, social history, OB history to HPI:937} 64 year old female with history of COPD on 2 L home oxygen  and current tobacco use who presents emergency department with cough, fever, and shortness of breath.  Patient reports that she recently was treated for COPD exacerbation and finished her steroids and antibiotics 4 days ago.  Says that since then she is gradually worsened.  Had a temperature of 100.4 today.  Says that she is feeling more short of breath as well.  Has been coughing.  Has chest tightness when she coughs but no chest pain otherwise.  No leg swelling.       Prior to Admission medications   Medication Sig Start Date End Date Taking? Authorizing Provider  acetaminophen  (TYLENOL ) 500 MG tablet Take 1,000 mg by mouth every 6 (six) hours as needed for moderate pain.    [provider]  albuterol  (VENTOLIN  HFA) 108 (90 Base) MCG/ACT inhaler INHALE 2 PUFFS BY MOUTH EVERY 6 HOURS AS NEEDED FOR WHEEZING FOR SHORTNESS OF BREATH 06/08/24   Jolinda Potter M, DO  alendronate  (FOSAMAX ) 70 MG tablet TAKE 1 TABLET EVERY 7 DAYS WITH A FULL GLASS OF WATER ON AN EMPTY STOMACH 01/17/24   Jolinda Potter M, DO  ALPRAZolam  (XANAX ) 0.5 MG tablet Take 1 tablet by mouth 4 times daily 05/29/24   Mozingo, Regina Nattalie, NP  amoxicillin -clavulanate (AUGMENTIN ) 875-125 MG tablet Take 1 tablet by mouth 2 (two) times daily. 06/05/24   Dettinger, Fonda LABOR, MD  aspirin  EC 81 MG tablet Take 81 mg by mouth daily.    [provider]  atorvastatin  (LIPITOR) 40 MG tablet Do not restart until ok by primary care provider 04/21/24   Tat, Alm, MD  budeson-glycopyrrolate-formoterol  (BREZTRI  AEROSPHERE) 160-9-4.8 MCG/ACT AERO Inhale 2 puffs into the lungs 2 (two) times  daily. 02/11/24   Jolinda Potter HERO, DO  Calcium -Magnesium -Zinc (CAL-MAG-ZINC PO) Take 1 tablet by mouth daily.    [provider]  cetirizine  (ZYRTEC ) 10 MG tablet Take 10 mg by mouth daily. 11/20/23   [provider]  Cholecalciferol (VITAMIN D3 MAXIMUM STRENGTH) 125 MCG (5000 UT) capsule Take 5,000 Units by mouth daily.    [provider]  DULoxetine  (CYMBALTA ) 60 MG capsule Take 2 capsules (120 mg total) by mouth daily. 01/17/24   Jolinda Potter HERO, DO  ipratropium-albuterol  (DUONEB) 0.5-2.5 (3) MG/3ML SOLN INHALE THE CONTENTS OF 1 VIAL VIA NEBULIZER EVERY 6 HOURS AS NEEDED Patient taking differently: Take 3 mLs by nebulization every 4 (four) hours as needed (Shortness of breath). 03/01/23   Mannam, Praveen, MD  meloxicam  (MOBIC ) 7.5 MG tablet Take 1 tablet (7.5 mg total) by mouth daily. 03/15/22   Jolinda Potter HERO, DO  midodrine  (PROAMATINE ) 5 MG tablet Take 1 tablet (5 mg total) by mouth 3 (three) times daily with meals. 06/02/24   Miriam Norris, NP  nitroGLYCERIN  (NITROSTAT ) 0.4 MG SL tablet Place 1 tablet (0.4 mg total) under the tongue every 5 (five) minutes x 3 doses as needed for chest pain (if o relief after 2nd dose, proceed to the ED for an evaluation or call 911). 01/28/23   Miriam Norris, NP  predniSONE  (DELTASONE ) 20 MG tablet 2 po at same time daily for 5 days 06/05/24   Dettinger, Fonda LABOR, MD  QUEtiapine  (SEROQUEL ) 200 MG tablet Take 1 tablet (200 mg total) by mouth at bedtime. 02/04/24   Mozingo, Regina Nattalie, NP  Respiratory Therapy Supplies (NEBULIZER/TUBING/MOUTHPIECE) KIT 1 Units by Does not apply route 4 (four) times daily. GIVE face mask and tubing for nebulizer. J44.1 11/21/21   Jolinda Norene HERO, DO  rOPINIRole  (REQUIP ) 3 MG tablet Take 1 tablet (3 mg total) by mouth at bedtime. 01/17/24   Jolinda Norene HERO, DO    Allergies: Doxycycline     Review of Systems  Updated Vital Signs BP (!) 115/95 (BP Location: Left Arm)   Pulse (!) 136    Temp 100 F (37.8 C) (Oral)   Resp 18   Ht 5' 4 (1.626 m)   Wt 51.7 kg   SpO2 93%   BMI 19.57 kg/m   Physical Exam Vitals and nursing note reviewed.  Constitutional:      General: She is not in acute distress.    Appearance: She is well-developed.  HENT:     Head: Normocephalic and atraumatic.     Right Ear: External ear normal.     Left Ear: External ear normal.     Nose: Nose normal.  Eyes:     Extraocular Movements: Extraocular movements intact.     Conjunctiva/sclera: Conjunctivae normal.     Pupils: Pupils are equal, round, and reactive to light.  Cardiovascular:     Rate and Rhythm: Regular rhythm. Tachycardia present.     Heart sounds: No murmur heard. Pulmonary:     Effort: Pulmonary effort is normal. No respiratory distress.     Breath sounds: Wheezing present.     Comments: On 3 L nasal cannula Musculoskeletal:     Cervical back: Normal range of motion and neck supple.     Right lower leg: No edema.     Left lower leg: No edema.  Skin:    General: Skin is warm and dry.  Neurological:     Mental Status: She is alert and oriented to person, place, and time. Mental status is at baseline.  Psychiatric:        Mood and Affect: Mood normal.     (all labs ordered are listed, but only abnormal results are displayed) Labs Reviewed  CULTURE, BLOOD (ROUTINE X 2)  CULTURE, BLOOD (ROUTINE X 2)  RESP PANEL BY RT-PCR (RSV, FLU A&B, COVID)  [id number]  LACTIC ACID, PLASMA  LACTIC ACID, PLASMA  COMPREHENSIVE METABOLIC PANEL WITH GFR  CBC WITH DIFFERENTIAL/PLATELET  URINALYSIS, W/ REFLEX TO CULTURE (INFECTION SUSPECTED)  PROCALCITONIN  URINALYSIS, ROUTINE W REFLEX MICROSCOPIC    EKG: None  Radiology: No results found.  {Document cardiac monitor, telemetry assessment procedure when appropriate:32947} Procedures   Medications Ordered in the ED  lactated ringers  bolus 1,000 mL (has no administration in time range)    And  lactated ringers  bolus 500 mL (has no  administration in time range)    And  lactated ringers  bolus 250 mL (has no administration in time range)  cefTRIAXone  (ROCEPHIN ) 2 g in sodium chloride  0.9 % 100 mL IVPB (has no administration in time range)  azithromycin  (ZITHROMAX ) 500 mg in sodium chloride  0.9 % 250 mL IVPB (has no administration in time range)  ipratropium-albuterol  (DUONEB) 0.5-2.5 (3) MG/3ML nebulizer solution 9 mL (has no administration in time range)  methylPREDNISolone  sodium succinate (SOLU-MEDROL ) 125 mg/2 mL injection 125 mg (has no administration in time range)      {Click here for ABCD2, HEART and other calculators REFRESH Note before  signing:1}                              Medical Decision Making Amount and/or Complexity of Data Reviewed Labs: ordered. Radiology: ordered.  Risk Prescription drug management.   ***  {Document critical care time when appropriate  Document review of labs and clinical decision tools ie CHADS2VASC2, etc  Document your independent review of radiology images and any outside records  Document your discussion with family members, caretakers and with consultants  Document social determinants of health affecting pt's care  Document your decision making why or why not admission, treatments were needed:32947:::1}   Final diagnoses:  None    ED Discharge Orders     None

## 2024-06-22 NOTE — Consult Note (Signed)
 CODE SEPSIS - PHARMACY COMMUNICATION  **Broad Spectrum Antibiotics should be administered within 1 hour of Sepsis diagnosis**  Time Code Sepsis Called/Page Received: 8061  Antibiotics Ordered: ceftriaxone   Time of 1st antibiotic administration: 2004  Additional action taken by pharmacy: None      Cathaleen GORMAN Blanch ,PharmD Clinical Pharmacist  06/22/2024  8:06 PM

## 2024-06-23 DIAGNOSIS — J441 Chronic obstructive pulmonary disease with (acute) exacerbation: Secondary | ICD-10-CM | POA: Diagnosis not present

## 2024-06-23 DIAGNOSIS — F418 Other specified anxiety disorders: Secondary | ICD-10-CM | POA: Diagnosis not present

## 2024-06-23 LAB — BASIC METABOLIC PANEL WITH GFR
Anion gap: 10 (ref 5–15)
BUN: 12 mg/dL (ref 8–23)
CO2: 25 mmol/L (ref 22–32)
Calcium: 8.5 mg/dL — ABNORMAL LOW (ref 8.9–10.3)
Chloride: 100 mmol/L (ref 98–111)
Creatinine, Ser: 0.68 mg/dL (ref 0.44–1.00)
GFR, Estimated: >60 mL/min (ref >60)
Glucose, Bld: 203 mg/dL — ABNORMAL HIGH (ref 70–99)
Potassium: 3.9 mmol/L (ref 3.5–5.1)
Sodium: 135 mmol/L (ref 135–145)

## 2024-06-23 LAB — CBC
HCT: 37.3 % (ref 36.0–46.0)
Hemoglobin: 12.7 g/dL (ref 12.0–15.0)
MCH: 31.4 pg (ref 26.0–34.0)
MCHC: 34 g/dL (ref 30.0–36.0)
MCV: 92.1 fL (ref 80.0–100.0)
Platelets: 277 K/uL (ref 150–400)
RBC: 4.05 MIL/uL (ref 3.87–5.11)
RDW: 13 % (ref 11.5–15.5)
WBC: 15 K/uL — ABNORMAL HIGH (ref 4.0–10.5)
nRBC: 0 % (ref 0.0–0.2)

## 2024-06-23 LAB — EXPECTORATED SPUTUM ASSESSMENT W GRAM STAIN, RFLX TO RESP C

## 2024-06-23 MED ORDER — IPRATROPIUM-ALBUTEROL 0.5-2.5 (3) MG/3ML IN SOLN
3.0000 mL | Freq: Three times a day (TID) | RESPIRATORY_TRACT | Status: DC
  Filled 2024-06-23: qty 3

## 2024-06-23 MED ORDER — ARFORMOTEROL TARTRATE 15 MCG/2ML IN NEBU
15.0000 ug | INHALATION_SOLUTION | Freq: Two times a day (BID) | RESPIRATORY_TRACT | Status: DC
  Administered 2024-06-23 – 2024-06-24 (×2): 15 ug via RESPIRATORY_TRACT
  Filled 2024-06-23 (×2): qty 2

## 2024-06-23 MED ORDER — IPRATROPIUM-ALBUTEROL 0.5-2.5 (3) MG/3ML IN SOLN
3.0000 mL | Freq: Three times a day (TID) | RESPIRATORY_TRACT | Status: DC
  Administered 2024-06-23 – 2024-06-24 (×2): 3 mL via RESPIRATORY_TRACT
  Filled 2024-06-23 (×2): qty 3

## 2024-06-23 MED ORDER — GUAIFENESIN 100 MG/5ML PO LIQD: 5.0000 mL | ORAL | Status: DC | PRN

## 2024-06-23 MED ORDER — IPRATROPIUM-ALBUTEROL 0.5-2.5 (3) MG/3ML IN SOLN
3.0000 mL | Freq: Two times a day (BID) | RESPIRATORY_TRACT | Status: DC
  Administered 2024-06-23: 3 mL via RESPIRATORY_TRACT

## 2024-06-23 MED ORDER — BUDESONIDE 0.5 MG/2ML IN SUSP
0.5000 mg | Freq: Two times a day (BID) | RESPIRATORY_TRACT | Status: DC
  Administered 2024-06-23 – 2024-06-24 (×2): 0.5 mg via RESPIRATORY_TRACT
  Filled 2024-06-23 (×2): qty 2

## 2024-06-23 MED ORDER — DM-GUAIFENESIN ER 30-600 MG PO TB12
1.0000 | ORAL_TABLET | Freq: Two times a day (BID) | ORAL | Status: DC
  Administered 2024-06-23 – 2024-06-24 (×3): 1 via ORAL
  Filled 2024-06-23 (×3): qty 1

## 2024-06-23 NOTE — H&P (Incomplete)
 History and Physical    Aylana Hirschfeld FMW:[id number] DOB: August 12, 1961 DOA: 06/22/2024  PCP: Jolinda Norene HERO, DO   Patient coming from: Home   Chief Complaint: Increased cough and SOB   HPI: Aleyssa Pike is a 63 y.o. female with medical history significant for COPD, chronic hypoxic respiratory failure, depression, anxiety, restless leg syndrome, and mild nonobstructive CAD who presents with increased cough and shortness of breath.  ***   ED Course: Upon arrival to the ED, patient is found to have an oral temp of 37.8 C and oxygen  saturations in the mid to upper 90s on 3 L/min supplemental oxygen  with mild tachypnea, tachycardia, and stable BP.  Labs are most notable for normal creatinine and WBC 22,200 with normal lactate.  CTA chest reveals emphysematous changes but no PE.    Blood cultures were collected in the ED and the patient was treated with 1.75 L of LR, acetaminophen , Rocephin , azithromycin , IV Solu-Medrol , DuoNeb, and additional albuterol .  Review of Systems:  All other systems reviewed and apart from HPI, are negative.  Past Medical History:  Diagnosis Date  . Anxiety   . Aortic atherosclerosis (HCC)   . Chronic kidney disease   . COPD (chronic obstructive pulmonary disease) (HCC)   . Depression   . Headache   . History of chest pain   . History of syncope   . Hypotension   . Osteoporosis   . Palpitations   . Panic attacks   . Pneumonia   . Restless legs syndrome (RLS)     Past Surgical History:  Procedure Laterality Date  . ABDOMINAL HYSTERECTOMY    . BIOPSY  05/20/2023   Procedure: BIOPSY;  Surgeon: Legrand Victory LITTIE DOUGLAS, MD;  Location: [facility];  Service: Gastroenterology;;  . COLONOSCOPY WITH PROPOFOL  N/A 05/20/2023   Procedure: COLONOSCOPY WITH PROPOFOL ;  Surgeon: Legrand Victory LITTIE DOUGLAS, MD;  Location: [facility];  Service: Gastroenterology;  Laterality: N/A;  . ESOPHAGOGASTRODUODENOSCOPY (EGD) WITH PROPOFOL  N/A 05/20/2023   Procedure:  ESOPHAGOGASTRODUODENOSCOPY (EGD) WITH PROPOFOL ;  Surgeon: Legrand Victory LITTIE DOUGLAS, MD;  Location: [facility];  Service: Gastroenterology;  Laterality: N/A;    Social History:   reports that she has been smoking cigarettes. She has a 21 pack-year smoking history. She has never used smokeless tobacco. She reports current alcohol use. She reports current drug use. Frequency: 1.00 time per week. Drug: Marijuana.  Allergies  Allergen Reactions  . Doxycycline  Other (See Comments)    Chest pain    Family History  Problem Relation Age of Onset  . COPD Mother   . COPD Father   . Diabetes type II Sister   . Kidney failure Sister   . Diabetes type II Brother   . COPD Maternal Grandfather   . Breast cancer Neg Hx   . Liver disease Neg Hx   . Esophageal cancer Neg Hx   . Colon cancer Neg Hx      Prior to Admission medications   Medication Sig Start Date End Date Taking? Authorizing Provider  acetaminophen  (TYLENOL ) 500 MG tablet Take 1,000 mg by mouth every 6 (six) hours as needed for moderate pain.   Yes [provider]  albuterol  (VENTOLIN  HFA) 108 (90 Base) MCG/ACT inhaler INHALE 2 PUFFS BY MOUTH EVERY 6 HOURS AS NEEDED FOR WHEEZING FOR SHORTNESS OF BREATH 06/08/24  Yes Gottschalk, Ashly M, DO  alendronate  (FOSAMAX ) 70 MG tablet TAKE 1 TABLET EVERY 7 DAYS WITH A FULL GLASS OF WATER ON AN EMPTY STOMACH 01/17/24  Yes Jolinda Potter M, DO  ALPRAZolam  (XANAX ) 0.5 MG tablet Take 1 tablet by mouth 4 times daily 05/29/24  Yes Mozingo, Regina Nattalie, NP  aspirin  EC 81 MG tablet Take 81 mg by mouth daily.   Yes [provider]  atorvastatin  (LIPITOR) 40 MG tablet Do not restart until ok by primary care provider 04/21/24  Yes Tat, Alm, MD  budeson-glycopyrrolate-formoterol  (BREZTRI  AEROSPHERE) 160-9-4.8 MCG/ACT AERO Inhale 2 puffs into the lungs 2 (two) times daily. 02/11/24  Yes Jolinda Potter M, DO  Calcium -Magnesium -Zinc (CAL-MAG-ZINC PO) Take 1 tablet by mouth daily.   Yes  [provider]  cetirizine  (ZYRTEC ) 10 MG tablet Take 10 mg by mouth daily. 11/20/23  Yes [provider]  Cholecalciferol (VITAMIN D3 MAXIMUM STRENGTH) 125 MCG (5000 UT) capsule Take 5,000 Units by mouth daily.   Yes [provider]  DULoxetine  (CYMBALTA ) 60 MG capsule Take 2 capsules (120 mg total) by mouth daily. 01/17/24  Yes Gottschalk, Ashly M, DO  ipratropium-albuterol  (DUONEB) 0.5-2.5 (3) MG/3ML SOLN INHALE THE CONTENTS OF 1 VIAL VIA NEBULIZER EVERY 6 HOURS AS NEEDED Patient taking differently: Take 3 mLs by nebulization every 4 (four) hours as needed (Shortness of breath). 03/01/23  Yes Mannam, Praveen, MD  midodrine  (PROAMATINE ) 5 MG tablet Take 1 tablet (5 mg total) by mouth 3 (three) times daily with meals. 06/02/24  Yes Miriam Norris, NP  nitroGLYCERIN  (NITROSTAT ) 0.4 MG SL tablet Place 1 tablet (0.4 mg total) under the tongue every 5 (five) minutes x 3 doses as needed for chest pain (if o relief after 2nd dose, proceed to the ED for an evaluation or call 911). 01/28/23  Yes Miriam Norris, NP  QUEtiapine  (SEROQUEL ) 200 MG tablet Take 1 tablet (200 mg total) by mouth at bedtime. 02/04/24  Yes Mozingo, Regina Nattalie, NP  rOPINIRole  (REQUIP ) 3 MG tablet Take 1 tablet (3 mg total) by mouth at bedtime. 01/17/24  Yes Jolinda Potter HERO, DO  Respiratory Therapy Supplies (NEBULIZER/TUBING/MOUTHPIECE) KIT 1 Units by Does not apply route 4 (four) times daily. GIVE face mask and tubing for nebulizer. J44.1 11/21/21   Jolinda Potter HERO, DO    Physical Exam: Vitals:   06/22/24 2045 06/22/24 2130 06/22/24 2132 06/22/24 2145  BP: 118/70 100/68  98/80  Pulse: (!) 102 100  (!) 101  Resp: (!) 23 (!) 27  (!) 25  Temp:      TempSrc:      SpO2: 99% 98% 95% 100%  Weight:      Height:         Constitutional: NAD, calm  Eyes: PERTLA, lids and conjunctivae normal ENMT: Mucous membranes are moist. Posterior pharynx clear of any exudate or lesions.   Neck: supple, no masses   Respiratory: clear to auscultation bilaterally, no wheezing, no crackles. No accessory muscle use.  Cardiovascular: S1 & S2 heard, regular rate and rhythm. No extremity edema. No significant JVD. Abdomen: No distension, no tenderness, soft. Bowel sounds active.  Musculoskeletal: no clubbing / cyanosis. No joint deformity upper and lower extremities.   Skin: no significant rashes, lesions, ulcers. Warm, dry, well-perfused. Neurologic: CN 2-12 grossly intact. Sensation intact, DTR normal. Strength 5/5 in all 4 limbs. Alert and oriented.  Psychiatric: Pleasant. Cooperative.    Labs and Imaging on Admission: I have personally reviewed following labs and imaging studies  CBC: Recent Labs  Lab 06/22/24 1941  WBC 22.2*  NEUTROABS 18.5*  HGB 16.0*  HCT 47.1*  MCV 93.1  PLT 333   Basic  Metabolic Panel: Recent Labs  Lab 06/22/24 1941  NA 138  K 4.0  CL 102  CO2 24  GLUCOSE 103*  BUN 12  CREATININE 0.81  CALCIUM  9.1   GFR: Estimated Creatinine Clearance: 58 mL/min (by C-G formula based on SCr of 0.81 mg/dL). Liver Function Tests: Recent Labs  Lab 06/22/24 1941  AST 28  ALT 28  ALKPHOS 72  BILITOT 0.5  PROT 7.5  ALBUMIN 3.9   No results for input(s): LIPASE, AMYLASE in the last 168 hours. No results for input(s): AMMONIA in the last 168 hours. Coagulation Profile: No results for input(s): INR, PROTIME in the last 168 hours. Cardiac Enzymes: No results for input(s): CKTOTAL, CKMB, CKMBINDEX, TROPONINI in the last 168 hours. BNP (last 3 results) No results for input(s): PROBNP in the last 8760 hours. HbA1C: No results for input(s): HGBA1C in the last 72 hours. CBG: No results for input(s): GLUCAP in the last 168 hours. Lipid Profile: No results for input(s): CHOL, HDL, LDLCALC, TRIG, CHOLHDL, LDLDIRECT in the last 72 hours. Thyroid  Function Tests: No results for input(s): TSH, T4TOTAL, FREET4, T3FREE, THYROIDAB in the  last 72 hours. Anemia Panel: No results for input(s): VITAMINB12, FOLATE, FERRITIN, TIBC, IRON, RETICCTPCT in the last 72 hours. Urine analysis:    Component Value Date/Time   COLORURINE YELLOW 06/22/2024 1951   APPEARANCEUR CLEAR 06/22/2024 1951   APPEARANCEUR Clear 04/28/2024 1445   LABSPEC 1.005 06/22/2024 1951   PHURINE 6.0 06/22/2024 1951   GLUCOSEU NEGATIVE 06/22/2024 1951   HGBUR MODERATE (A) 06/22/2024 1951   BILIRUBINUR NEGATIVE 06/22/2024 1951   BILIRUBINUR Negative 04/28/2024 1445   KETONESUR NEGATIVE 06/22/2024 1951   PROTEINUR NEGATIVE 06/22/2024 1951   UROBILINOGEN 0.2 12/12/2012 1945   NITRITE NEGATIVE 06/22/2024 1951   LEUKOCYTESUR TRACE (A) 06/22/2024 1951   Sepsis Labs: @LABRCNTIP (procalcitonin:4,lacticidven:4) ) Recent Results (from the past 240 hours)  Resp panel by RT-PCR (RSV, Flu A&B, Covid) Anterior Nasal Swab     Status: None   Collection Time: 06/22/24  7:45 PM   Specimen: Anterior Nasal Swab  Result Value Ref Range Status   SARS Coronavirus 2 by RT PCR NEGATIVE NEGATIVE Final    Comment: (NOTE) SARS-CoV-2 target nucleic acids are NOT DETECTED.  The SARS-CoV-2 RNA is generally detectable in upper respiratory specimens during the acute phase of infection. The lowest concentration of SARS-CoV-2 viral copies this assay can detect is 138 copies/mL. A negative result does not preclude SARS-Cov-2 infection and should not be used as the sole basis for treatment or other patient management decisions. A negative result may occur with  improper specimen collection/handling, submission of specimen other than nasopharyngeal swab, presence of viral mutation(s) within the areas targeted by this assay, and inadequate number of viral copies(<138 copies/mL). A negative result must be combined with clinical observations, patient history, and epidemiological information. The expected result is Negative.  Fact Sheet for Patients:   BloggerCourse.com  Fact Sheet for Healthcare Providers:  SeriousBroker.it  This test is no t yet approved or cleared by the [place]  FDA and  has been authorized for detection and/or diagnosis of SARS-CoV-2 by FDA under an Emergency Use Authorization (EUA). This EUA will remain  in effect (meaning this test can be used) for the duration of the COVID-19 declaration under Section 564(b)(1) of the Act, 21 U.S.C.section 360bbb-3(b)(1), unless the authorization is terminated  or revoked sooner.       Influenza A by PCR NEGATIVE NEGATIVE Final   Influenza B by PCR NEGATIVE  NEGATIVE Final    Comment: (NOTE) The Xpert Xpress SARS-CoV-2/FLU/RSV plus assay is intended as an aid in the diagnosis of influenza from Nasopharyngeal swab specimens and should not be used as a sole basis for treatment. Nasal washings and aspirates are unacceptable for Xpert Xpress SARS-CoV-2/FLU/RSV testing.  Fact Sheet for Patients: BloggerCourse.com  Fact Sheet for Healthcare Providers: SeriousBroker.it  This test is not yet approved or cleared by the [place]  FDA and has been authorized for detection and/or diagnosis of SARS-CoV-2 by FDA under an Emergency Use Authorization (EUA). This EUA will remain in effect (meaning this test can be used) for the duration of the COVID-19 declaration under Section 564(b)(1) of the Act, 21 U.S.C. section 360bbb-3(b)(1), unless the authorization is terminated or revoked.     Resp Syncytial Virus by PCR NEGATIVE NEGATIVE Final    Comment: (NOTE) Fact Sheet for Patients: BloggerCourse.com  Fact Sheet for Healthcare Providers: SeriousBroker.it  This test is not yet approved or cleared by the [place]  FDA and has been authorized for detection and/or diagnosis of SARS-CoV-2 by FDA under an Emergency Use  Authorization (EUA). This EUA will remain in effect (meaning this test can be used) for the duration of the COVID-19 declaration under Section 564(b)(1) of the Act, 21 U.S.C. section 360bbb-3(b)(1), unless the authorization is terminated or revoked.  Performed at [facility], [address], [place], [place] [address]      Radiological Exams on Admission: CT Angio Chest PE W and/or Wo Contrast Result Date: 06/22/2024 EXAM: CTA of the Chest with and without contrast for PE 06/22/2024 10:47:42 PM TECHNIQUE: CTA of the chest was performed without and with the administration of 75 mL of intravenous iohexol  (OMNIPAQUE ) 350 MG/ML. Multiplanar reformatted images are provided for review. MIP images are provided for review. Automated exposure control, iterative reconstruction, and/or weight based adjustment of the mA/kV was utilized to reduce the radiation dose to as low as reasonably achievable. COMPARISON: Same day radiograph and CTA chest. CLINICAL HISTORY: Pulmonary embolism (PE) suspected, high prob. Pt with fever since yesterday, states 100.4 today. Pt on home O2 at 2 L/M. Pt with cough, states it's not new. + chest tightness and in her back. FINDINGS: PULMONARY ARTERIES: Pulmonary arteries are adequately opacified for evaluation. Negative for pulmonary embolism. Main pulmonary artery is normal in caliber. MEDIASTINUM: The heart and pericardium demonstrate a small pericardial effusion, unchanged. Coronary artery calcification. There is no acute abnormality of the thoracic aorta. Normal caliber thoracic aorta without dissection. LYMPH NODES: No mediastinal, hilar or axillary lymphadenopathy. LUNGS AND PLEURA: Moderate emphysema. No focal consolidation or pulmonary edema. No pleural effusion or pneumothorax. UPPER ABDOMEN: Limited images of the upper abdomen are unremarkable. SOFT TISSUES AND BONES: No acute bone or soft tissue abnormality. IMPRESSION: 1. No pulmonary embolism. 2. Moderate emphysema.  Electronically signed by: Norman Gatlin MD 06/22/2024 11:29 PM EDT RP Workstation: [id number]   DG Chest 2 View Result Date: 06/22/2024 EXAM: 2 VIEW(S) XRAY OF THE CHEST 06/22/2024 07:04:29 PM COMPARISON: 04/18/2024 CLINICAL HISTORY: Fever/sob. Table formatting from the original note was not included.; Pt with fever since yesterday, states 100.4 today. Pt on home O2 at 2 L/M. Pt with cough, states it's not new. + chest tightness and in her back. FINDINGS: LUNGS AND PLEURA: Hyperinflation and chronic bronchitic change. No focal consolidation, pleural effusion, or pneumothorax. HEART AND MEDIASTINUM: Stable cardiomediastinal silhouette. BONES AND SOFT TISSUES: No acute osseous abnormality. IMPRESSION: 1. No acute findings. 2. Emphysema. Electronically signed by: Norman Gatlin MD 06/22/2024  07:58 PM EDT RP Workstation: [id number]    EKG: Independently reviewed. Sinus tachycardia, rate 112, RAD.   Assessment/Plan   1. COPD exacerbation; chronic hypoxic respiratory failure  - ***   -  -   2. Depression, anxiety  - Cymbalta , Seroquel , Xanax     3. CAD - Mild, non-obstructive  - ASA, Lipitor    4. Orthostatic syncope - Started on midodrine  by cardiology in June  - Continue midodrine      DVT prophylaxis: Lovenox   Code Status: Full  Level of Care: Level of care: Telemetry Family Communication: ***  Disposition Plan:  Patient is from: Home  Anticipated d/c is to: Home  Anticipated d/c date is: 06/25/24  Patient currently: Pending improved respiratory status  Consults called: None Admission status: Inpatient     Evalene GORMAN Sprinkles, MD [facility]  06/22/2024, 11:52 PM

## 2024-06-23 NOTE — TOC CM/SW Note (Signed)
 Transition of Care [facility]) - Inpatient Brief Assessment   Patient Details  Name: Alicia Sanchez MRN: [id number] Date of Birth: 26-May-1961  Transition of Care [facility]) CM/SW Contact:    Lucie Lunger, LCSWA Phone Number: 06/23/2024, 9:47 AM  Clinical Narrative: Transition of Care Department [facility]) has reviewed patient and no TOC needs have been identified at this time. We will continue to monitor patient advancement through interdiciplinary progression rounds. If new patient transition needs arise, please place a TOC consult.  Transition of Care Asessment: Insurance and Status: Insurance coverage has been reviewed Patient has primary care physician: Yes Home environment has been reviewed: From home Prior level of function:: Independent Prior/Current Home Services: No current home services Social Drivers of Health Review: SDOH reviewed no interventions necessary Readmission risk has been reviewed: Yes Transition of care needs: no transition of care needs at this time

## 2024-06-23 NOTE — ED Notes (Signed)
 Report called to admission RN.  All questions answered.

## 2024-06-23 NOTE — Progress Notes (Signed)
 Progress Note   Patient: Alicia Sanchez FMW:[id number] DOB: 28-Nov-1960 DOA: 06/22/2024     1 DOS: the patient was seen and examined on 06/23/2024   Brief hospital admission narrative: As per H&P written by Dr. Charlton on a 425  Alicia Sanchez is a 63 y.o. female with medical history significant for COPD, chronic hypoxic respiratory failure, depression, anxiety, restless leg syndrome, and mild nonobstructive CAD who presents with increased cough and shortness of breath.   Patient reports increased shortness of breath, increased cough, and increased production of thick colored sputum.  She also developed a fever yesterday.  She states that she had just recently completed a course of steroids and antibiotics but continued to worsen despite this.  She describes a tight sensation in her chest which she often experiences with acute respiratory illness.   ED Course: Upon arrival to the ED, patient is found to have an oral temp of 37.8 C and oxygen  saturations in the mid to upper 90s on 3 L/min supplemental oxygen  with mild tachypnea, tachycardia, and stable BP.  Labs are most notable for normal creatinine and WBC 22,200 with normal lactate.  CTA chest reveals emphysematous changes but no PE.     Blood cultures were collected in the ED and the patient was treated with 1.75 L of LR, acetaminophen , Rocephin , azithromycin , IV Solu-Medrol , DuoNeb, and additional albuterol .  Assessment and plan 1-acute on chronic hypoxic respiratory failure - Patient reporting the usage of 2 L nasal cannula supplementation intermittently (mainly at nighttime and with activity when needed). - Acute recurrent process in the setting of bronchitis/bronchiectasis along with COPD exacerbation - Continue treatment with steroids, antibiotics, mucolytic's and bronchodilator management. -Currently on 3 L nasal cannula supplementation; planning to wean off as tolerated.  2-COPD exacerbation - As mentioned above we will continue  treatment with bronchodilator management, steroids, mucolytic's, flutter valve and supportive care - Brovana  and Pulmicort  has been ordered - Follow clinical response and wean oxygen  supplementation back to baseline as tolerated.  3-GERD/GI prophylaxis - Continue PPI.  4-depression/anxiety - Continue treatment with Cymbalta , Seroquel  and as needed Xanax .  5-history of coronary artery disease - Mild, nonobstructive - No chest pain - Continue aspirin  and Lipitor - Telemetry has demonstrated sinus rhythm will discontinue at the moment.  No ischemic changes appreciated.  6-orthostatic syncope - Patient has been started on midodrine  by cardiology service - Continue treatment with same dose - Patient denies any lightheadedness or syncope event.  7-hyperglycemia - No prior history of diabetes - Most likely in the setting of his steroid usage - Will check A1c.   Subjective:  Feeling short winded with activity and demonstrating mild difficulty speaking in full sentences.  No chest pain, no nausea, no vomiting.  Good saturation on 3 L supplementation.  Physical Exam: Vitals:   06/23/24 0534 06/23/24 1011 06/23/24 1335 06/23/24 1501  BP: 97/63  (!) 97/54   Pulse: 84  92   Resp: 19  20   Temp: 98 F (36.7 C)  98.5 F (36.9 C)   TempSrc:   Oral   SpO2: 93% 97% 95% 91%  Weight:      Height:       General exam: Alert, awake, oriented x 3; in no acute distress. Respiratory system: Positive rhonchi Expiratory wheezing appreciated bilaterally.  Fair air movement.  No using accessory muscle. Cardiovascular system:RRR. No rubs or gallops; no JVD. Gastrointestinal system: Abdomen is nondistended, soft and nontender. No organomegaly or masses felt. Normal bowel sounds heard. Central  nervous system: No focal neurological deficits. Extremities: No cyanosis or clubbing. Skin: No petechiae. Psychiatry: Judgement and insight appear normal. Mood & affect appropriate.    Data  Reviewed: Basic metabolic panel: Sodium 135, potassium 3.9, chloride 100, bicarb 25, BUN 12, creatinine 0.68 and GFR >60 CBC: WBCs 15.0, hemoglobin 12.7 and platelet count 277K  Family Communication: No family at bedside.  Disposition: Status is: Inpatient Remains inpatient appropriate because: Continue IV therapy.  Anticipating discharge back home once medically stable.  Time spent: 50 minutes  Author: Eric Nunnery, MD 06/23/2024 4:23 PM  For on call review [email or web address].

## 2024-06-24 ENCOUNTER — Ambulatory Visit: Admitting: Family Medicine

## 2024-06-24 DIAGNOSIS — J441 Chronic obstructive pulmonary disease with (acute) exacerbation: Secondary | ICD-10-CM | POA: Diagnosis not present

## 2024-06-24 LAB — CBC
HCT: 34.8 % — ABNORMAL LOW (ref 36.0–46.0)
Hemoglobin: 12 g/dL (ref 12.0–15.0)
MCH: 31.7 pg (ref 26.0–34.0)
MCHC: 34.5 g/dL (ref 30.0–36.0)
MCV: 92.1 fL (ref 80.0–100.0)
Platelets: 297 K/uL (ref 150–400)
RBC: 3.78 MIL/uL — ABNORMAL LOW (ref 3.87–5.11)
RDW: 13.2 % (ref 11.5–15.5)
WBC: 18.8 K/uL — ABNORMAL HIGH (ref 4.0–10.5)
nRBC: 0 % (ref 0.0–0.2)

## 2024-06-24 LAB — BASIC METABOLIC PANEL WITH GFR
Anion gap: 11 (ref 5–15)
BUN: 15 mg/dL (ref 8–23)
CO2: 25 mmol/L (ref 22–32)
Calcium: 8.1 mg/dL — ABNORMAL LOW (ref 8.9–10.3)
Chloride: 104 mmol/L (ref 98–111)
Creatinine, Ser: 0.63 mg/dL (ref 0.44–1.00)
GFR, Estimated: >60 mL/min (ref >60)
Glucose, Bld: 132 mg/dL — ABNORMAL HIGH (ref 70–99)
Potassium: 3.8 mmol/L (ref 3.5–5.1)
Sodium: 140 mmol/L (ref 135–145)

## 2024-06-24 LAB — HEMOGLOBIN A1C
Hgb A1c MFr Bld: 5.5 % (ref 4.8–5.6)
Mean Plasma Glucose: 111 mg/dL

## 2024-06-24 MED ORDER — METHYLPREDNISOLONE 4 MG PO TBPK
ORAL_TABLET | ORAL | 0 refills | Status: DC
Start: 2024-06-24 — End: 2024-08-28

## 2024-06-24 MED ORDER — MIDODRINE HCL 5 MG PO TABS: 10.0000 mg | ORAL_TABLET | Freq: Three times a day (TID) | ORAL | Status: DC

## 2024-06-24 MED ORDER — MIDODRINE HCL 10 MG PO TABS: 10.0000 mg | ORAL_TABLET | Freq: Three times a day (TID) | ORAL | 1 refills | Status: DC

## 2024-06-24 MED ORDER — LEVOFLOXACIN 250 MG PO TABS: 500.0000 mg | ORAL_TABLET | Freq: Every day | ORAL | 0 refills | Status: AC

## 2024-06-24 NOTE — Plan of Care (Signed)
   Problem: Coping: Goal: Level of anxiety will decrease Outcome: Progressing

## 2024-06-24 NOTE — Discharge Summary (Signed)
 Physician Discharge Summary   Patient: Alicia Sanchez MRN: [id number] DOB: 1961-04-12  Admit date:     06/22/2024  Discharge date: 06/24/24  Discharge Physician: Adriana DELENA Grams   PCP: Jolinda Norene HERO, DO   Recommendations at discharge:   Follow-up with PCP in 1-2 weeks Continue to taper down steroids, recurrent antibiotics Avoid tobacco and tobacco products - Follow-up with primary psychologist, psychiatrist for modification of current medication as it may be contributing to chronic hypertension, Current home medication of midodrine  has been increased from 5 to 10 mg p.o. 3 times daily  Discharge Diagnoses: Principal Problem:   COPD with acute exacerbation (HCC) Active Problems:   Depression with anxiety   Chronic respiratory failure with hypoxia (HCC)  Alicia Sanchez is a 63 y.o. female with medical history significant for COPD, chronic hypoxic respiratory failure, depression, anxiety, restless leg syndrome, and mild nonobstructive CAD who presents with increased cough and shortness of breath.   Patient reports increased shortness of breath, increased cough, and increased production of thick colored sputum.  She also developed a fever yesterday.  She states that she had just recently completed a course of steroids and antibiotics but continued to worsen despite this.  She describes a tight sensation in her chest which she often experiences with acute respiratory illness.   ED Course: Upon arrival to the ED, patient is found to have an oral temp of 37.8 C and oxygen  saturations in the mid to upper 90s on 3 L/min supplemental oxygen  with mild tachypnea, tachycardia, and stable BP.  Labs are most notable for normal creatinine and WBC 22,200 with normal lactate.  CTA chest reveals emphysematous changes but no PE.     Blood cultures were collected in the ED and the patient was treated with 1.75 L of LR, acetaminophen , Rocephin , azithromycin , IV Solu-Medrol , DuoNeb, and additional  albuterol .  Acute on chronic hypoxic respiratory failure - POA required as high as 3 L of oxygen , currently on 1.5 L of oxygen , satting 97%.  - Patient reporting the usage of 2 L nasal cannula supplementation intermittently (mainly at nighttime and with activity when needed). - Acute recurrent process in the setting of bronchitis/bronchiectasis along with COPD exacerbation - S/p treatment with steroids, antibiotics, mucolytic's and bronchodilator management. Switching antibiotics to p.o. Levaquin , tapering down steroid prednisone  continue mucolytic's    COPD exacerbation - As mentioned above we will continue treatment with bronchodilator management, steroids, mucolytic's, flutter valve and supportive care - Brovana  and Pulmicort  has been ordered - O2 sat back to baseline   GERD/GI prophylaxis - Continue PPI.   depression/anxiety - Continue treatment with Cymbalta , Seroquel  and Xanax . Recommended follow-up with PCP and primary psychologist/psychiatrist for modification of blood meds   History of coronary artery disease - Mild, nonobstructive - No chest pain - Continue aspirin  and Lipitor - Telemetry has demonstrated sinus rhythm will discontinue at the moment.  No ischemic changes appreciated.   Orthostatic syncope - Patient has been started on midodrine  by cardiology service Midodrine  has been increased from 5 to 10 mg p.o. 3 times daily - Patient denies any lightheadedness or syncope event.    hyperglycemia - No prior history of diabetes - Most likely in the setting of his steroid usage A1c 5.5   Disposition: Home Diet recommendation:  Discharge Diet Orders (From admission, onward)     Start     Ordered   06/24/24 0000  Diet - low sodium heart healthy        06/24/24 1144  Cardiac diet DISCHARGE MEDICATION: Allergies as of 06/24/2024       Reactions   Doxycycline  Other (See Comments)   Chest pain        Medication List     STOP taking these  medications    CAL-MAG-ZINC PO       TAKE these medications    acetaminophen  500 MG tablet Commonly known as: TYLENOL  Take 1,000 mg by mouth every 6 (six) hours as needed for moderate pain.   albuterol  108 (90 Base) MCG/ACT inhaler Commonly known as: VENTOLIN  HFA INHALE 2 PUFFS BY MOUTH EVERY 6 HOURS AS NEEDED FOR WHEEZING FOR SHORTNESS OF BREATH   alendronate  70 MG tablet Commonly known as: FOSAMAX  TAKE 1 TABLET EVERY 7 DAYS WITH A FULL GLASS OF WATER ON AN EMPTY STOMACH   ALPRAZolam  0.5 MG tablet Commonly known as: XANAX  Take 1 tablet by mouth 4 times daily   aspirin  EC 81 MG tablet Take 81 mg by mouth daily.   atorvastatin  40 MG tablet Commonly known as: LIPITOR Do not restart until ok by primary care provider   Breztri  Aerosphere 160-9-4.8 MCG/ACT Aero inhaler Generic drug: budesonide -glycopyrrolate-formoterol  Inhale 2 puffs into the lungs 2 (two) times daily.   cetirizine  10 MG tablet Commonly known as: ZYRTEC  Take 10 mg by mouth daily.   DULoxetine  60 MG capsule Commonly known as: CYMBALTA  Take 2 capsules (120 mg total) by mouth daily.   ipratropium-albuterol  0.5-2.5 (3) MG/3ML Soln Commonly known as: DUONEB INHALE THE CONTENTS OF 1 VIAL VIA NEBULIZER EVERY 6 HOURS AS NEEDED What changed: See the new instructions.   levofloxacin  250 MG tablet Commonly known as: Levaquin  Take 2 tablets (500 mg total) by mouth daily for 3 days.   methylPREDNISolone  4 MG Tbpk tablet Commonly known as: MEDROL  DOSEPAK Medrol  Dosepak take as instructed   midodrine  10 MG tablet Commonly known as: PROAMATINE  Take 1 tablet (10 mg total) by mouth 3 (three) times daily with meals. What changed:  medication strength how much to take   Nebulizer/Tubing/Mouthpiece Kit 1 Units by Does not apply route 4 (four) times daily. GIVE face mask and tubing for nebulizer. J44.1   nitroGLYCERIN  0.4 MG SL tablet Commonly known as: NITROSTAT  Place 1 tablet (0.4 mg total) under the  tongue every 5 (five) minutes x 3 doses as needed for chest pain (if o relief after 2nd dose, proceed to the ED for an evaluation or call 911).   QUEtiapine  200 MG tablet Commonly known as: SEROQUEL  Take 1 tablet (200 mg total) by mouth at bedtime.   rOPINIRole  3 MG tablet Commonly known as: REQUIP  Take 1 tablet (3 mg total) by mouth at bedtime.   Vitamin D3 Maximum Strength 125 MCG (5000 UT) capsule Generic drug: Cholecalciferol Take 5,000 Units by mouth daily.        Discharge Exam: Filed Weights   06/22/24 1833 06/23/24 0134  Weight: 51.7 kg 53.8 kg        General:  AAO x 3,  cooperative, no distress;   HEENT:  Normocephalic, PERRL, otherwise with in Normal limits   Neuro:  CNII-XII intact. , normal motor and sensation, reflexes intact   Lungs:   Clear to auscultation BL, Respirations unlabored,  No wheezes / crackles  Cardio:    S1/S2, RRR, No murmure, No Rubs or Gallops   Abdomen:  Soft, non-tender, bowel sounds active all four quadrants, no guarding or peritoneal signs.  Muscular  skeletal:  Limited exam -global generalized weaknesses - in bed, able  to move all 4 extremities,   2+ pulses,  symmetric, No pitting edema  Skin:  Dry, warm to touch, negative for any Rashes,  Wounds: Please see nursing documentation          Condition at discharge: good  The results of significant diagnostics from this hospitalization (including imaging, microbiology, ancillary and laboratory) are listed below for reference.   Imaging Studies: CT Angio Chest PE W and/or Wo Contrast Result Date: 06/22/2024 EXAM: CTA of the Chest with and without contrast for PE 06/22/2024 10:47:42 PM TECHNIQUE: CTA of the chest was performed without and with the administration of 75 mL of intravenous iohexol  (OMNIPAQUE ) 350 MG/ML. Multiplanar reformatted images are provided for review. MIP images are provided for review. Automated exposure control, iterative reconstruction, and/or weight based  adjustment of the mA/kV was utilized to reduce the radiation dose to as low as reasonably achievable. COMPARISON: Same day radiograph and CTA chest. CLINICAL HISTORY: Pulmonary embolism (PE) suspected, high prob. Pt with fever since yesterday, states 100.4 today. Pt on home O2 at 2 L/M. Pt with cough, states it's not new. + chest tightness and in her back. FINDINGS: PULMONARY ARTERIES: Pulmonary arteries are adequately opacified for evaluation. Negative for pulmonary embolism. Main pulmonary artery is normal in caliber. MEDIASTINUM: The heart and pericardium demonstrate a small pericardial effusion, unchanged. Coronary artery calcification. There is no acute abnormality of the thoracic aorta. Normal caliber thoracic aorta without dissection. LYMPH NODES: No mediastinal, hilar or axillary lymphadenopathy. LUNGS AND PLEURA: Moderate emphysema. No focal consolidation or pulmonary edema. No pleural effusion or pneumothorax. UPPER ABDOMEN: Limited images of the upper abdomen are unremarkable. SOFT TISSUES AND BONES: No acute bone or soft tissue abnormality. IMPRESSION: 1. No pulmonary embolism. 2. Moderate emphysema. Electronically signed by: Norman Gatlin MD 06/22/2024 11:29 PM EDT RP Workstation: [id number]   DG Chest 2 View Result Date: 06/22/2024 EXAM: 2 VIEW(S) XRAY OF THE CHEST 06/22/2024 07:04:29 PM COMPARISON: 04/18/2024 CLINICAL HISTORY: Fever/sob. Table formatting from the original note was not included.; Pt with fever since yesterday, states 100.4 today. Pt on home O2 at 2 L/M. Pt with cough, states it's not new. + chest tightness and in her back. FINDINGS: LUNGS AND PLEURA: Hyperinflation and chronic bronchitic change. No focal consolidation, pleural effusion, or pneumothorax. HEART AND MEDIASTINUM: Stable cardiomediastinal silhouette. BONES AND SOFT TISSUES: No acute osseous abnormality. IMPRESSION: 1. No acute findings. 2. Emphysema. Electronically signed by: Norman Gatlin MD 06/22/2024 07:58 PM EDT RP  Workstation: [id number]    Microbiology: Results for orders placed or performed during the hospital encounter of 06/22/24  Blood culture (routine x 2)     Status: None (Preliminary result)   Collection Time: 06/22/24  7:41 PM   Specimen: BLOOD  Result Value Ref Range Status   Specimen Description BLOOD LEFT ANTECUBITAL  Final   Special Requests   Final    BOTTLES DRAWN AEROBIC AND ANAEROBIC Blood Culture adequate volume   Culture   Final    NO GROWTH 2 DAYS Performed at [facility], [address], [place], [place] [address]    Report Status PENDING  Incomplete  Resp panel by RT-PCR (RSV, Flu A&B, Covid) Anterior Nasal Swab     Status: None   Collection Time: 06/22/24  7:45 PM   Specimen: Anterior Nasal Swab  Result Value Ref Range Status   SARS Coronavirus 2 by RT PCR NEGATIVE NEGATIVE Final    Comment: (NOTE) SARS-CoV-2 target nucleic acids are NOT DETECTED.  The SARS-CoV-2 RNA is  generally detectable in upper respiratory specimens during the acute phase of infection. The lowest concentration of SARS-CoV-2 viral copies this assay can detect is 138 copies/mL. A negative result does not preclude SARS-Cov-2 infection and should not be used as the sole basis for treatment or other patient management decisions. A negative result may occur with  improper specimen collection/handling, submission of specimen other than nasopharyngeal swab, presence of viral mutation(s) within the areas targeted by this assay, and inadequate number of viral copies(<138 copies/mL). A negative result must be combined with clinical observations, patient history, and epidemiological information. The expected result is Negative.  Fact Sheet for Patients:  BloggerCourse.com  Fact Sheet for Healthcare Providers:  SeriousBroker.it  This test is no t yet approved or cleared by the [place]  FDA and  has been authorized for detection and/or diagnosis of  SARS-CoV-2 by FDA under an Emergency Use Authorization (EUA). This EUA will remain  in effect (meaning this test can be used) for the duration of the COVID-19 declaration under Section 564(b)(1) of the Act, 21 U.S.C.section 360bbb-3(b)(1), unless the authorization is terminated  or revoked sooner.       Influenza A by PCR NEGATIVE NEGATIVE Final   Influenza B by PCR NEGATIVE NEGATIVE Final    Comment: (NOTE) The Xpert Xpress SARS-CoV-2/FLU/RSV plus assay is intended as an aid in the diagnosis of influenza from Nasopharyngeal swab specimens and should not be used as a sole basis for treatment. Nasal washings and aspirates are unacceptable for Xpert Xpress SARS-CoV-2/FLU/RSV testing.  Fact Sheet for Patients: BloggerCourse.com  Fact Sheet for Healthcare Providers: SeriousBroker.it  This test is not yet approved or cleared by the [place]  FDA and has been authorized for detection and/or diagnosis of SARS-CoV-2 by FDA under an Emergency Use Authorization (EUA). This EUA will remain in effect (meaning this test can be used) for the duration of the COVID-19 declaration under Section 564(b)(1) of the Act, 21 U.S.C. section 360bbb-3(b)(1), unless the authorization is terminated or revoked.     Resp Syncytial Virus by PCR NEGATIVE NEGATIVE Final    Comment: (NOTE) Fact Sheet for Patients: BloggerCourse.com  Fact Sheet for Healthcare Providers: SeriousBroker.it  This test is not yet approved or cleared by the [place]  FDA and has been authorized for detection and/or diagnosis of SARS-CoV-2 by FDA under an Emergency Use Authorization (EUA). This EUA will remain in effect (meaning this test can be used) for the duration of the COVID-19 declaration under Section 564(b)(1) of the Act, 21 U.S.C. section 360bbb-3(b)(1), unless the authorization is terminated  or revoked.  Performed at [facility], [address], [place], [place] [address]   Blood culture (routine x 2)     Status: None (Preliminary result)   Collection Time: 06/22/24  8:00 PM   Specimen: BLOOD  Result Value Ref Range Status   Specimen Description BLOOD RIGHT ANTECUBITAL  Final   Special Requests   Final    BOTTLES DRAWN AEROBIC AND ANAEROBIC Blood Culture adequate volume   Culture   Final    NO GROWTH 2 DAYS Performed at [facility], [address], [place], [place] [address]    Report Status PENDING  Incomplete  Expectorated Sputum Assessment w Gram Stain, Rflx to Resp Cult     Status: None   Collection Time: 06/22/24 11:52 PM   Specimen: Sputum  Result Value Ref Range Status   Specimen Description SPUTUM  Final   Special Requests NONE  Final   Sputum evaluation   Final  THIS SPECIMEN IS ACCEPTABLE FOR SPUTUM CULTURE Performed at [facility], [address], [place], [place] [address]    Report Status 06/23/2024 FINAL  Final  Culture, Respiratory w Gram Stain     Status: None (Preliminary result)   Collection Time: 06/22/24 11:52 PM   Specimen: SPU  Result Value Ref Range Status   Specimen Description   Final    SPUTUM Performed at [facility], [address] [place], [place] [address]    Special Requests   Final    NONE Reflexed from [id number] Performed at [facility], [address], [place], [place] [address]    Gram Stain   Final    ABUNDANT WBC PRESENT, PREDOMINANTLY PMN NO ORGANISMS SEEN Performed at [facility], [address] [place], [place] [address]    Culture PENDING  Incomplete   Report Status PENDING  Incomplete    Labs: CBC: Recent Labs  Lab 06/22/24 1941 06/23/24 0500 06/24/24 0449  WBC 22.2* 15.0* 18.8*  NEUTROABS 18.5*  --   --   HGB 16.0* 12.7 12.0  HCT 47.1* 37.3 34.8*  MCV 93.1 92.1 92.1  PLT 333 277 297   Basic Metabolic Panel: Recent Labs  Lab 06/22/24 1941 06/23/24 0500 06/24/24 0449  NA 138 135 140  K  4.0 3.9 3.8  CL 102 100 104  CO2 24 25 25   GLUCOSE 103* 203* 132*  BUN 12 12 15   CREATININE 0.81 0.68 0.63  CALCIUM  9.1 8.5* 8.1*   Liver Function Tests: Recent Labs  Lab 06/22/24 1941  AST 28  ALT 28  ALKPHOS 72  BILITOT 0.5  PROT 7.5  ALBUMIN 3.9   CBG: No results for input(s): GLUCAP in the last 168 hours.  Discharge time spent: greater than 30 minutes.  Signed: Adriana DELENA Grams, MD [facility] 06/24/2024

## 2024-06-24 NOTE — Care Management Important Message (Signed)
 Important Message  Patient Details  Name: Alicia Sanchez MRN: [id number] Date of Birth: 06-Jun-1961   Important Message Given:  N/A - LOS <3 / Initial given by admissions     Duwaine LITTIE Ada 06/24/2024, 11:47 AM

## 2024-06-24 NOTE — Progress Notes (Signed)
 Mobility Specialist Progress Note:    06/24/24 1140  Mobility  Activity Ambulated with assistance  Level of Assistance Modified independent, requires aide device or extra time  Assistive Device None  Distance Ambulated (ft) 120 ft  Range of Motion/Exercises Active;All extremities  Activity Response Tolerated well  Mobility Referral Yes  Mobility visit 1 Mobility  Mobility Specialist Start Time (ACUTE ONLY) 1123  Mobility Specialist Stop Time (ACUTE ONLY) 1140  Mobility Specialist Time Calculation (min) (ACUTE ONLY) 17 min   Pt received ambulating, agreeable to further mobility. Modi to ambulate with no AD. Tolerated well, c/o SOB after session. SpO2 91-94% on RA. Left pt sitting EOB, all needs met.   Sherrilee Ditty Mobility Specialist Please contact via Special educational needs teacher or  Rehab office at [phone]

## 2024-06-25 ENCOUNTER — Telehealth: Payer: Self-pay

## 2024-06-25 LAB — CULTURE, RESPIRATORY W GRAM STAIN: Culture: NORMAL

## 2024-06-25 NOTE — Transitions of Care (Post Inpatient/ED Visit) (Signed)
   06/25/2024  Name: [name] MRN: [id number] DOB: [birth year]  Today's TOC FU Call Status: Today's TOC FU Call Status:: Unsuccessful Call (1st Attempt) Unsuccessful Call (1st Attempt) Date: 06/25/24  Attempted to reach the patient regarding the most recent Inpatient/ED visit.  Follow Up Plan: Additional outreach attempts will be made to reach the patient to complete the Transitions of Care (Post Inpatient/ED visit) call.   Arvin Seip RN, BSN, CCM [employer], Population Health Case Manager Phone: [phone]

## 2024-06-26 ENCOUNTER — Telehealth: Payer: Self-pay

## 2024-06-26 NOTE — Transitions of Care (Post Inpatient/ED Visit) (Signed)
   06/26/2024  Name: Verdelle Valtierra MRN: [id number] DOB: June 11, 1961  Today's TOC FU Call Status: Today's TOC FU Call Status:: Unsuccessful Call (2nd Attempt) Unsuccessful Call (2nd Attempt) Date: 06/26/24  Attempted to reach the patient regarding the most recent Inpatient/ED visit.  Follow Up Plan: Additional outreach attempts will be made to reach the patient to complete the Transitions of Care (Post Inpatient/ED visit) call.   Alan Ee, RN, BSN, CEN [employer]- Transition of Care Team.  [facility] [phone]

## 2024-06-27 LAB — CULTURE, BLOOD (ROUTINE X 2)
Culture: NO GROWTH
Culture: NO GROWTH
Special Requests: ADEQUATE
Special Requests: ADEQUATE

## 2024-06-29 ENCOUNTER — Telehealth: Payer: Self-pay | Admitting: *Deleted

## 2024-06-29 NOTE — Transitions of Care (Post Inpatient/ED Visit) (Signed)
   06/29/2024  Name: Alicia Sanchez MRN: [id number] DOB: 06-Feb-1961  Today's TOC FU Call Status: Today's TOC FU Call Status:: Unsuccessful Call (3rd Attempt) Unsuccessful Call (3rd Attempt) Date: 06/29/24  Attempted to reach the patient regarding the most recent Inpatient/ED visit.  Follow Up Plan: No further outreach attempts will be made at this time. We have been unable to contact the patient.  Cathlean Headland BSN RN [facility] Health Care Management Coordinator [email or web address] Direct Dial: [phone]  Fax: [phone] Website: [email or web address]

## 2024-07-02 ENCOUNTER — Other Ambulatory Visit: Payer: Self-pay | Admitting: Adult Health

## 2024-07-02 DIAGNOSIS — F410 Panic disorder [episodic paroxysmal anxiety] without agoraphobia: Secondary | ICD-10-CM

## 2024-07-02 DIAGNOSIS — F411 Generalized anxiety disorder: Secondary | ICD-10-CM

## 2024-07-03 NOTE — Telephone Encounter (Signed)
Sent MyChart message to schedule FU, was a no show last appt.

## 2024-07-03 NOTE — Telephone Encounter (Signed)
 Please call and schedule an earlier appt, was due in June, was a no show in July, so a month out for a controlled substance is too far.

## 2024-07-07 NOTE — Telephone Encounter (Signed)
 Apt 8/20 5 pm

## 2024-07-08 ENCOUNTER — Encounter: Payer: Self-pay | Admitting: Adult Health

## 2024-07-08 ENCOUNTER — Telehealth (INDEPENDENT_AMBULATORY_CARE_PROVIDER_SITE_OTHER): Admitting: Adult Health

## 2024-07-08 DIAGNOSIS — F4310 Post-traumatic stress disorder, unspecified: Secondary | ICD-10-CM | POA: Diagnosis not present

## 2024-07-08 DIAGNOSIS — F410 Panic disorder [episodic paroxysmal anxiety] without agoraphobia: Secondary | ICD-10-CM

## 2024-07-08 DIAGNOSIS — F331 Major depressive disorder, recurrent, moderate: Secondary | ICD-10-CM

## 2024-07-08 DIAGNOSIS — F411 Generalized anxiety disorder: Secondary | ICD-10-CM | POA: Diagnosis not present

## 2024-07-08 MED ORDER — ALPRAZOLAM 0.5 MG PO TABS: 0.5000 mg | ORAL_TABLET | Freq: Four times a day (QID) | ORAL | 2 refills | Status: AC

## 2024-07-08 MED ORDER — QUETIAPINE FUMARATE 200 MG PO TABS: 200.0000 mg | ORAL_TABLET | Freq: Every day | ORAL | 1 refills | Status: DC

## 2024-07-08 NOTE — Progress Notes (Signed)
 Alicia Sanchez [id number] 1960-11-26 63 y.o.  Virtual Visit via Video Note  I connected with pt @ on 07/08/24 at  5:00 PM EDT by a video enabled telemedicine application and verified that I am speaking with the correct person using two identifiers.   I discussed the limitations of evaluation and management by telemedicine and the availability of in person appointments. The patient expressed understanding and agreed to proceed.  I discussed the assessment and treatment plan with the patient. The patient was provided an opportunity to ask questions and all were answered. The patient agreed with the plan and demonstrated an understanding of the instructions.   The patient was advised to call back or seek an in-person evaluation if the symptoms worsen or if the condition fails to improve as anticipated.  I provided 25 minutes of non-face-to-face time during this encounter.  The patient was located at home.  The provider was located at [facility].   Angeline LOISE Sayers, NP   Subjective:   Patient ID:  Alicia Sanchez is a 63 y.o. (DOB [birth year]) female.  Chief Complaint: No chief complaint on file.   HPI Alicia Sanchez presents for follow-up of GAD, MDD, PTSD, insomnia and panic attacks.  Describes mood today as ok. Pleasant. Tearful. Mood symptoms - reports depression and anxiety. Denies irritability. Reports varying interest and motivation. Reports panic attacks. Reports worry, rumination and over thinking. Reports mood is stable. Stating I feel like I'm doing ok. Feels like medications are helpful. Taking medication as prescribed.                                                              Energy levels lower. Active, does not have a regular exercise routine.   Enjoys some usual interests and activities. Married, but legally separated. Lives alone with dog. Has two daughters and 2 step daughters. Has 3 sisters and 2 brothers. Mother in [place] - cancer. Spending time with  family. Involved in church. Appetite adequate. Weight gain - 118 pounds. Sleeping better some nights than others. Averages 8 or more hours. Reports daytime napping. Reports focus and concentration difficulties - stopping and starting different projects and not finishing them. Managing aspects of household. Disabled since 2020.  Denies SI or HI.  Denies AH or VH. Denies self harm. Denies substance use.  Previous medication trials: Paxil , Wellbutrin, Depakote, Trazadone and others   Review of Systems:  Review of Systems  Musculoskeletal:  Negative for gait problem.  Neurological:  Negative for tremors.  Psychiatric/Behavioral:         Please refer to HPI    Medications: I have reviewed the patient's current medications.  Current Outpatient Medications  Medication Sig Dispense Refill   acetaminophen  (TYLENOL ) 500 MG tablet Take 1,000 mg by mouth every 6 (six) hours as needed for moderate pain.     albuterol  (VENTOLIN  HFA) 108 (90 Base) MCG/ACT inhaler INHALE 2 PUFFS BY MOUTH EVERY 6 HOURS AS NEEDED FOR WHEEZING FOR SHORTNESS OF BREATH 9 g 2   alendronate  (FOSAMAX ) 70 MG tablet TAKE 1 TABLET EVERY 7 DAYS WITH A FULL GLASS OF WATER ON AN EMPTY STOMACH 12 tablet 3   ALPRAZolam  (XANAX ) 0.5 MG tablet Take 1 tablet by mouth 4 times daily 120 tablet 0   aspirin  EC 81 MG  tablet Take 81 mg by mouth daily.     atorvastatin  (LIPITOR) 40 MG tablet Do not restart until ok by primary care provider 90 tablet 3   budeson-glycopyrrolate-formoterol  (BREZTRI  AEROSPHERE) 160-9-4.8 MCG/ACT AERO Inhale 2 puffs into the lungs 2 (two) times daily. 32.1 g 5   cetirizine  (ZYRTEC ) 10 MG tablet Take 10 mg by mouth daily.     Cholecalciferol (VITAMIN D3 MAXIMUM STRENGTH) 125 MCG (5000 UT) capsule Take 5,000 Units by mouth daily.     DULoxetine  (CYMBALTA ) 60 MG capsule Take 2 capsules (120 mg total) by mouth daily. 180 capsule 3   ipratropium-albuterol  (DUONEB) 0.5-2.5 (3) MG/3ML SOLN INHALE THE CONTENTS OF 1 VIAL  VIA NEBULIZER EVERY 6 HOURS AS NEEDED (Patient taking differently: Take 3 mLs by nebulization every 4 (four) hours as needed (Shortness of breath).) 360 mL 11   methylPREDNISolone  (MEDROL  DOSEPAK) 4 MG TBPK tablet Medrol  Dosepak take as instructed 21 tablet 0   midodrine  (PROAMATINE ) 10 MG tablet Take 1 tablet (10 mg total) by mouth 3 (three) times daily with meals. 90 tablet 1   nitroGLYCERIN  (NITROSTAT ) 0.4 MG SL tablet Place 1 tablet (0.4 mg total) under the tongue every 5 (five) minutes x 3 doses as needed for chest pain (if o relief after 2nd dose, proceed to the ED for an evaluation or call 911). 25 tablet 2   QUEtiapine  (SEROQUEL ) 200 MG tablet Take 1 tablet (200 mg total) by mouth at bedtime. 90 tablet 1   Respiratory Therapy Supplies (NEBULIZER/TUBING/MOUTHPIECE) KIT 1 Units by Does not apply route 4 (four) times daily. GIVE face mask and tubing for nebulizer. J44.1 1 kit 0   rOPINIRole  (REQUIP ) 3 MG tablet Take 1 tablet (3 mg total) by mouth at bedtime. 90 tablet 3   No current facility-administered medications for this visit.    Medication Side Effects: None  Allergies:  Allergies  Allergen Reactions   Doxycycline  Other (See Comments)    Chest pain    Past Medical History:  Diagnosis Date   Anxiety    Aortic atherosclerosis (HCC)    Chronic kidney disease    COPD (chronic obstructive pulmonary disease) (HCC)    Depression    Headache    History of chest pain    History of syncope    Hypotension    Osteoporosis    Palpitations    Panic attacks    Pneumonia    Restless legs syndrome (RLS)     Family History  Problem Relation Age of Onset   COPD Mother    COPD Father    Diabetes type II Sister    Kidney failure Sister    Diabetes type II Brother    COPD Maternal Grandfather    Breast cancer Neg Hx    Liver disease Neg Hx    Esophageal cancer Neg Hx    Colon cancer Neg Hx     Social History   Socioeconomic History   Marital status: Married    Spouse  name: Not on file   Number of children: 2   Years of education: Not on file   Highest education level: GED or equivalent  Occupational History   Not on file  Tobacco Use   Smoking status: Every Day    Current packs/day: 0.50    Average packs/day: 0.5 packs/day for 42.0 years (21.0 ttl pk-yrs)    Types: Cigarettes   Smokeless tobacco: Never   Tobacco comments:    Smoking 4-5 cigarettes.  11/28/2023 hfb  Vaping Use   Vaping status: Never Used  Substance and Sexual Activity   Alcohol use: Yes    Comment: soical   Drug use: Yes    Frequency: 1.0 times per week    Types: Marijuana    Comment: not in few weeks   Sexual activity: Yes    Birth control/protection: Surgical  Other Topics Concern   Not on file  Social History Narrative   Not on file   Social Drivers of Health   Financial Resource Strain: Medium Risk (01/16/2024)   Overall Financial Resource Strain (CARDIA)    Difficulty of Paying Living Expenses: Somewhat hard  Food Insecurity: Food Insecurity Present (06/23/2024)   Hunger Vital Sign    Worried About Running Out of Food in the Last Year: Sometimes true    Ran Out of Food in the Last Year: Sometimes true  Transportation Needs: No Transportation Needs (06/23/2024)   PRAPARE - Administrator, Civil Service (Medical): No    Lack of Transportation (Non-Medical): No  Physical Activity: Inactive (01/16/2024)   Exercise Vital Sign    Days of Exercise per Week: 0 days    Minutes of Exercise per Session: 0 min  Stress: No Stress Concern Present (01/16/2024)   [employer] of Occupational Health - Occupational Stress Questionnaire    Feeling of Stress : Only a little  Social Connections: Moderately Isolated (04/18/2024)   Social Connection and Isolation Panel    Frequency of Communication with Friends and Family: More than three times a week    Frequency of Social Gatherings with Friends and Family: Once a week    Attends Religious Services: Never    Automotive engineer or Organizations: Yes    Attends Banker Meetings: Never    Marital Status: Separated  Intimate Partner Violence: Not At Risk (06/23/2024)   Humiliation, Afraid, Rape, and Kick questionnaire    Fear of Current or Ex-Partner: No    Emotionally Abused: No    Physically Abused: No    Sexually Abused: No    Past Medical History, Surgical history, Social history, and Family history were reviewed and updated as appropriate.   Please see review of systems for further details on the patient's review from today.   Objective:   Physical Exam:  There were no vitals taken for this visit.  Physical Exam Constitutional:      General: She is not in acute distress. Musculoskeletal:        General: No deformity.  Neurological:     Mental Status: She is alert and oriented to person, place, and time.     Coordination: Coordination normal.  Psychiatric:        Attention and Perception: Attention and perception normal. She does not perceive auditory or visual hallucinations.        Mood and Affect: Mood normal. Mood is not anxious or depressed. Affect is not labile, blunt, angry or inappropriate.        Speech: Speech normal.        Behavior: Behavior normal.        Thought Content: Thought content normal. Thought content is not paranoid or delusional. Thought content does not include homicidal or suicidal ideation. Thought content does not include homicidal or suicidal plan.        Cognition and Memory: Cognition and memory normal.        Judgment: Judgment normal.     Comments: Insight intact     Lab  Review:     Component Value Date/Time   NA 140 06/24/2024 0449   NA 140 05/01/2024 1240   K 3.8 06/24/2024 0449   CL 104 06/24/2024 0449   CO2 25 06/24/2024 0449   GLUCOSE 132 (H) 06/24/2024 0449   BUN 15 06/24/2024 0449   BUN 13 05/01/2024 1240   CREATININE 0.63 06/24/2024 0449   CALCIUM  8.1 (L) 06/24/2024 0449   PROT 7.5 06/22/2024 1941   PROT 6.7  04/28/2024 1447   ALBUMIN 3.9 06/22/2024 1941   ALBUMIN 4.3 04/28/2024 1447   AST 28 06/22/2024 1941   ALT 28 06/22/2024 1941   ALKPHOS 72 06/22/2024 1941   BILITOT 0.5 06/22/2024 1941   BILITOT 0.3 04/28/2024 1447   GFRNONAA >60 06/24/2024 0449   GFRAA 91 06/03/2020 1505       Component Value Date/Time   WBC 18.8 (H) 06/24/2024 0449   RBC 3.78 (L) 06/24/2024 0449   HGB 12.0 06/24/2024 0449   HGB 14.5 05/01/2024 1240   HCT 34.8 (L) 06/24/2024 0449   HCT 44.0 05/01/2024 1240   PLT 297 06/24/2024 0449   PLT 411 05/01/2024 1240   MCV 92.1 06/24/2024 0449   MCV 94 05/01/2024 1240   MCH 31.7 06/24/2024 0449   MCHC 34.5 06/24/2024 0449   RDW 13.2 06/24/2024 0449   RDW 12.8 05/01/2024 1240   LYMPHSABS 2.2 06/22/2024 1941   LYMPHSABS 1.8 05/01/2024 1240   MONOABS 1.4 (H) 06/22/2024 1941   EOSABS 0.0 06/22/2024 1941   EOSABS 0.1 05/01/2024 1240   BASOSABS 0.1 06/22/2024 1941   BASOSABS 0.0 05/01/2024 1240    No results found for: POCLITH, LITHIUM   No results found for: PHENYTOIN, PHENOBARB, VALPROATE, CBMZ   .res Assessment: Plan:    Plan:  PDMP reviewed  Xanax  0.5mg  twice daily to four times daily.  Seroquel  200mg  at hs  PCP: Cymbalta  120mg  every morning  Refer to therapist  RTC 3 months  Patient advised to contact office with any questions, adverse effects, or acute worsening in signs and symptoms.  Discussed potential benefits, risk, and side effects of benzodiazepines to include potential risk of tolerance and dependence, as well as possible drowsiness. Advised patient not to drive if experiencing drowsiness and to take lowest possible effective dose to minimize risk of dependence and tolerance.  There are no diagnoses linked to this encounter.   Please see After Visit Summary for patient specific instructions.  Future Appointments  Date Time Provider Department Center  07/08/2024  5:00 PM Kall[place]i Coupland, Angeline Mattocks, NP [facility] None  08/28/2024   2:30 PM Jolinda Norene HERO, DO [facility] None  12/23/2024 10:40 AM [facility]-ANNUAL WELLNESS VISIT [facility] None  02/02/2025  2:00 PM Jolinda Norene HERO, DO [facility] None    No orders of the defined types were placed in this encounter.     -------------------------------

## 2024-07-11 DIAGNOSIS — R062 Wheezing: Secondary | ICD-10-CM | POA: Diagnosis not present

## 2024-07-11 DIAGNOSIS — J449 Chronic obstructive pulmonary disease, unspecified: Secondary | ICD-10-CM | POA: Diagnosis not present

## 2024-08-05 ENCOUNTER — Ambulatory Visit: Admitting: Adult Health

## 2024-08-07 DIAGNOSIS — F4310 Post-traumatic stress disorder, unspecified: Secondary | ICD-10-CM | POA: Diagnosis not present

## 2024-08-07 DIAGNOSIS — F32A Depression, unspecified: Secondary | ICD-10-CM | POA: Diagnosis not present

## 2024-08-11 DIAGNOSIS — J449 Chronic obstructive pulmonary disease, unspecified: Secondary | ICD-10-CM | POA: Diagnosis not present

## 2024-08-11 DIAGNOSIS — R062 Wheezing: Secondary | ICD-10-CM | POA: Diagnosis not present

## 2024-08-17 ENCOUNTER — Other Ambulatory Visit: Payer: Self-pay | Admitting: *Deleted

## 2024-08-17 MED ORDER — MIDODRINE HCL 10 MG PO TABS: 10.0000 mg | ORAL_TABLET | Freq: Three times a day (TID) | ORAL | 0 refills | Status: DC

## 2024-08-17 NOTE — Telephone Encounter (Signed)
 She needs to be seen for this.  Not sure if it is still appropriate and it looks like she cancelled her hosp follow up.

## 2024-08-28 ENCOUNTER — Ambulatory Visit: Admitting: Family Medicine

## 2024-08-28 VITALS — BP 102/65 | HR 111 | Temp 97.8°F | Ht 64.0 in | Wt 120.4 lb

## 2024-08-28 DIAGNOSIS — Z23 Encounter for immunization: Secondary | ICD-10-CM | POA: Diagnosis not present

## 2024-08-28 DIAGNOSIS — J432 Centrilobular emphysema: Secondary | ICD-10-CM

## 2024-08-28 DIAGNOSIS — I959 Hypotension, unspecified: Secondary | ICD-10-CM | POA: Diagnosis not present

## 2024-08-28 DIAGNOSIS — D72829 Elevated white blood cell count, unspecified: Secondary | ICD-10-CM | POA: Diagnosis not present

## 2024-08-28 MED ORDER — MIDODRINE HCL 10 MG PO TABS: 10.0000 mg | ORAL_TABLET | Freq: Three times a day (TID) | ORAL | 0 refills | Status: DC

## 2024-08-28 NOTE — Progress Notes (Signed)
 Subjective: CC: Follow-up hypotension PCP: Jolinda Norene HERO, DO YEP:Alicia Sanchez is a 63 y.o. female presenting to clinic today for:  Chronic hypotension.   Noted to be more pronounced while she was hospitalized so her midodrine  was increased to 10 mg 3 times daily.  This is typically managed by cardiology but she was out and did not have a cardiology follow-up recently so we refilled it until she could present today.  She notes that blood pressures are still running around 90s/40-50s.  No loss of consciousness.  Shortness of breath is chronic.  She still has not been put on any scheduled azithromycin  but has not followed up with pulmonology either   ROS: Per HPI  Allergies  Allergen Reactions   Doxycycline  Other (See Comments)    Chest pain   Past Medical History:  Diagnosis Date   Anxiety    Aortic atherosclerosis    Chronic kidney disease    COPD (chronic obstructive pulmonary disease) (HCC)    Depression    Headache    History of chest pain    History of syncope    Hypotension    Osteoporosis    Palpitations    Panic attacks    Pneumonia    Restless legs syndrome (RLS)     Current Outpatient Medications:    acetaminophen  (TYLENOL ) 500 MG tablet, Take 1,000 mg by mouth every 6 (six) hours as needed for moderate pain., Disp: , Rfl:    albuterol  (VENTOLIN  HFA) 108 (90 Base) MCG/ACT inhaler, INHALE 2 PUFFS BY MOUTH EVERY 6 HOURS AS NEEDED FOR WHEEZING FOR SHORTNESS OF BREATH, Disp: 9 g, Rfl: 2   alendronate  (FOSAMAX ) 70 MG tablet, TAKE 1 TABLET EVERY 7 DAYS WITH A FULL GLASS OF WATER ON AN EMPTY STOMACH, Disp: 12 tablet, Rfl: 3   ALPRAZolam  (XANAX ) 0.5 MG tablet, Take 1 tablet (0.5 mg total) by mouth 4 (four) times daily., Disp: 120 tablet, Rfl: 2   aspirin  EC 81 MG tablet, Take 81 mg by mouth daily., Disp: , Rfl:    atorvastatin  (LIPITOR) 40 MG tablet, Do not restart until ok by primary care provider, Disp: 90 tablet, Rfl: 3   budeson-glycopyrrolate-formoterol   (BREZTRI  AEROSPHERE) 160-9-4.8 MCG/ACT AERO, Inhale 2 puffs into the lungs 2 (two) times daily., Disp: 32.1 g, Rfl: 5   cetirizine  (ZYRTEC ) 10 MG tablet, Take 10 mg by mouth daily., Disp: , Rfl:    Cholecalciferol (VITAMIN D3 MAXIMUM STRENGTH) 125 MCG (5000 UT) capsule, Take 5,000 Units by mouth daily., Disp: , Rfl:    DULoxetine  (CYMBALTA ) 60 MG capsule, Take 2 capsules (120 mg total) by mouth daily., Disp: 180 capsule, Rfl: 3   ipratropium-albuterol  (DUONEB) 0.5-2.5 (3) MG/3ML SOLN, INHALE THE CONTENTS OF 1 VIAL VIA NEBULIZER EVERY 6 HOURS AS NEEDED (Patient taking differently: Take 3 mLs by nebulization every 4 (four) hours as needed (Shortness of breath).), Disp: 360 mL, Rfl: 11   midodrine  (PROAMATINE ) 10 MG tablet, Take 1 tablet (10 mg total) by mouth 3 (three) times daily with meals., Disp: 90 tablet, Rfl: 0   nitroGLYCERIN  (NITROSTAT ) 0.4 MG SL tablet, Place 1 tablet (0.4 mg total) under the tongue every 5 (five) minutes x 3 doses as needed for chest pain (if o relief after 2nd dose, proceed to the ED for an evaluation or call 911)., Disp: 25 tablet, Rfl: 2   QUEtiapine  (SEROQUEL ) 200 MG tablet, Take 1 tablet (200 mg total) by mouth at bedtime., Disp: 90 tablet, Rfl: 1   Respiratory Therapy  Supplies (NEBULIZER/TUBING/MOUTHPIECE) KIT, 1 Units by Does not apply route 4 (four) times daily. GIVE face mask and tubing for nebulizer. J44.1, Disp: 1 kit, Rfl: 0   rOPINIRole  (REQUIP ) 3 MG tablet, Take 1 tablet (3 mg total) by mouth at bedtime., Disp: 90 tablet, Rfl: 3 Social History   Socioeconomic History   Marital status: Married    Spouse name: Not on file   Number of children: 2   Years of education: Not on file   Highest education level: GED or equivalent  Occupational History   Not on file  Tobacco Use   Smoking status: Every Day    Current packs/day: 0.50    Average packs/day: 0.5 packs/day for 42.0 years (21.0 ttl pk-yrs)    Types: Cigarettes   Smokeless tobacco: Never   Tobacco  comments:    Smoking 4-5 cigarettes.  11/28/2023 hfb  Vaping Use   Vaping status: Never Used  Substance and Sexual Activity   Alcohol use: Yes    Comment: soical   Drug use: Yes    Frequency: 1.0 times per week    Types: Marijuana    Comment: not in few weeks   Sexual activity: Yes    Birth control/protection: Surgical  Other Topics Concern   Not on file  Social History Narrative   Not on file   Social Drivers of Health   Financial Resource Strain: Medium Risk (08/28/2024)   Overall Financial Resource Strain (CARDIA)    Difficulty of Paying Living Expenses: Somewhat hard  Food Insecurity: Food Insecurity Present (08/28/2024)   Hunger Vital Sign    Worried About Running Out of Food in the Last Year: Sometimes true    Ran Out of Food in the Last Year: Never true  Transportation Needs: No Transportation Needs (08/28/2024)   PRAPARE - Administrator, Civil Service (Medical): No    Lack of Transportation (Non-Medical): No  Physical Activity: Inactive (08/28/2024)   Exercise Vital Sign    Days of Exercise per Week: 0 days    Minutes of Exercise per Session: Not on file  Stress: Stress Concern Present (08/28/2024)   [employer] of Occupational Health - Occupational Stress Questionnaire    Feeling of Stress: Rather much  Social Connections: Socially Isolated (08/28/2024)   Social Connection and Isolation Panel    Frequency of Communication with Friends and Family: More than three times a week    Frequency of Social Gatherings with Friends and Family: Once a week    Attends Religious Services: Patient declined    Database administrator or Organizations: No    Attends Engineer, structural: Not on file    Marital Status: Separated  Intimate Partner Violence: Not At Risk (06/23/2024)   Humiliation, Afraid, Rape, and Kick questionnaire    Fear of Current or Ex-Partner: No    Emotionally Abused: No    Physically Abused: No    Sexually Abused: No   Family  History  Problem Relation Age of Onset   COPD Mother    COPD Father    Diabetes type II Sister    Kidney failure Sister    Diabetes type II Brother    COPD Maternal Grandfather    Breast cancer Neg Hx    Liver disease Neg Hx    Esophageal cancer Neg Hx    Colon cancer Neg Hx     Objective: Office vital signs reviewed. BP 102/65   Pulse (!) 111   Temp 97.8 F (  36.6 C)   Ht 5' 4 (1.626 m)   Wt 120 lb 6 oz (54.6 kg)   SpO2 92%   BMI 20.66 kg/m   Physical Examination:  General: Awake, alert, nontoxic appearing female, No acute distress HEENT: sclera white, MMM Cardio: regular rate and rhythm, S1S2 heard, no murmurs appreciated Pulm: globally decreased breath sounds. clear to auscultation bilaterally, no wheezes, rhonchi or rales; slight increased work of breathing on room air after ambulation   Assessment/ Plan: 63 y.o. female   Centrilobular emphysema (HCC)  Hypotension, unspecified hypotension type - Plan: midodrine  (PROAMATINE ) 10 MG tablet  Leukocytosis, unspecified type - Plan: CBC with Differential   I wonder if she would benefit from 3 times weekly azithromycin .  Will CC pulmonology to see if this is something they are willing to start for her  Blood pressure is within acceptable range on midodrine  10 mg 3 times daily.  I renewed this for 1 month until she can secure an appoint with cardiology.  Will CC them to see if this can be arranged for her  Recheck CBC.  White blood cell count over 18, which I suspect was a combination of acute illness and steroid use in the hospital.  Will look for resolution  Pneumococcal and tetanus shots administered today.  She will hold off on shingles vacc   Afton Mikelson CHRISTELLA Fielding, DO [facility] [phone]

## 2024-08-28 NOTE — Patient Instructions (Addendum)
 Fasting labs at next OV. Your physical is in March We can do that shingles vaccine during that visit unless you want to schedule with a nurse before then.

## 2024-08-29 LAB — CBC WITH DIFFERENTIAL/PLATELET
Basophils Absolute: 0.1 x10E3/uL (ref 0.0–0.2)
Basos: 1 %
EOS (ABSOLUTE): 0.1 x10E3/uL (ref 0.0–0.4)
Eos: 1 %
Hematocrit: 42.7 % (ref 34.0–46.6)
Hemoglobin: 14.3 g/dL (ref 11.1–15.9)
Immature Grans (Abs): 0 x10E3/uL (ref 0.0–0.1)
Immature Granulocytes: 0 %
Lymphocytes Absolute: 2.2 x10E3/uL (ref 0.7–3.1)
Lymphs: 26 %
MCH: 31.2 pg (ref 26.6–33.0)
MCHC: 33.5 g/dL (ref 31.5–35.7)
MCV: 93 fL (ref 79–97)
Monocytes Absolute: 0.6 x10E3/uL (ref 0.1–0.9)
Monocytes: 7 %
Neutrophils Absolute: 5.5 x10E3/uL (ref 1.4–7.0)
Neutrophils: 65 %
Platelets: 328 x10E3/uL (ref 150–450)
RBC: 4.59 x10E6/uL (ref 3.77–5.28)
RDW: 12.1 % (ref 11.7–15.4)
WBC: 8.5 x10E3/uL (ref 3.4–10.8)

## 2024-08-31 ENCOUNTER — Ambulatory Visit: Payer: Self-pay | Admitting: Family Medicine

## 2024-09-04 ENCOUNTER — Encounter: Payer: Self-pay | Admitting: Nurse Practitioner

## 2024-09-04 ENCOUNTER — Ambulatory Visit: Attending: Nurse Practitioner | Admitting: Nurse Practitioner

## 2024-09-04 VITALS — BP 96/68 | HR 88 | Ht 64.0 in | Wt 120.8 lb

## 2024-09-04 DIAGNOSIS — Z8679 Personal history of other diseases of the circulatory system: Secondary | ICD-10-CM | POA: Diagnosis not present

## 2024-09-04 DIAGNOSIS — R55 Syncope and collapse: Secondary | ICD-10-CM

## 2024-09-04 DIAGNOSIS — J449 Chronic obstructive pulmonary disease, unspecified: Secondary | ICD-10-CM | POA: Diagnosis not present

## 2024-09-04 DIAGNOSIS — I959 Hypotension, unspecified: Secondary | ICD-10-CM

## 2024-09-04 DIAGNOSIS — Z87898 Personal history of other specified conditions: Secondary | ICD-10-CM

## 2024-09-04 DIAGNOSIS — Z720 Tobacco use: Secondary | ICD-10-CM

## 2024-09-04 NOTE — Progress Notes (Unsigned)
 Office Visit    Patient Name: Alicia Sanchez Date of Encounter: 05/05/2024 PCP:  Jolinda Norene HERO DO [facility]  Cardiologist:  Alvan Carrier, MD  Advanced Practice Provider:  Miriam Norris, NP Electrophysiologist:  None  [phone]  Chief Complaint and HPI    Alicia Sanchez is a 63 y.o. female with a hx of syncope, palpitations, history of chest pain, COPD, osteoporosis, PSVT, hypotension, anxiety/depression/panic attacks who presents today for follow-up.   Coronary CTA in 2021 revealed coronary calcium  score of 45, nonobstructive mild CAD noted. History consistent with orthostatic syncope. Last seen by Dr. Carrier Alvan on Mar 27, 2022.  EKG showed normal sinus rhythm.  Conservative measures discussed regarding orthostatic syncope, recommended if ongoing symptoms may need to consider Florinef.  Was seen at [facility] 01/26/2023 for COPD exacerbation.  Last seen by Dr. Carrier Alvan on October 08, 2023.  She admitted to atypical chest pain with symptoms resolving with antibiotics and steroids, likely it was felt that her symptoms were related to COPD at the time.  Hospital admission from end of May to early June 2025 for COPD exacerbation.  D-dimer was elevated.  CTA of chest was negative for PE, minimal peripheral opacities in left lower lobe noted.  Echocardiogram revealed EF 65 to 70%, trivial pericardial effusion, no wall motion abnormalities, normal RVEF.  Right upper quadrant ultrasound revealed small amount of perihepatic fluid, otherwise negative.  Atorvastatin  held temporarily due to elevated LFTs, mildly elevated but stable/improving.  Today she presents for follow-up.  Continues to admit to episodes of orthostatic syncope, has been ongoing for a year or more. Has episodes of lightheadedness. Admits to chest tightness with exertion, goes away with rest and also admits to shortness of breath during this time. Does see a pulmonologist per her  report.  Also tells me she does wear compression stockings. Denies any palpitations, orthopnea, PND, swelling or significant weight changes, acute bleeding, or claudication.    Hospitalized in August 2025 due to COPD exacerbation.  Received treatment including steroids, antibiotics, mucolytic's and bronchodilator management.  She was switched to p.o. antibiotics.  Midodrine  had been increased from 5 to 10 mg p.o. 3 times daily and she denied any lightheadedness or syncope event in the hospital.  She is here for follow-up today.  She states  ROS:  Negative. See HPI.  EKGs/Labs/Other Studies Reviewed:   The following studies were reviewed today:   EKG:  EKG is not ordered today.    Echo 04/2024:  1. Left ventricular ejection fraction, by estimation, is 65 to 70%. The  left ventricle has normal function. The left ventricle has no regional  wall motion abnormalities. Left ventricular diastolic parameters were  normal.   2. Right ventricular systolic function is normal. The right ventricular  size is mildly enlarged.   3. The mitral valve is normal in structure. No evidence of mitral valve  regurgitation. No evidence of mitral stenosis.   4. The aortic valve is tricuspid. Aortic valve regurgitation is not  visualized. No aortic stenosis is present.   5. The inferior vena cava is normal in size with greater than 50%  respiratory variability, suggesting right atrial pressure of 3 mmHg.   Cardiac monitor report 02/2023:   14 day monitor   Rare supraventricular ectopy in the form of isolated PACs, couplets, triplets. 11 runs of SVT longest 20 beats   Rare ventricular ectopy in the form of isolated PVCs, couplets. Isolated 6 beat run of NSVT  Several patient triggered events but no diary of symptoms included.     Patch Wear Time:  14 days and 0 hours (2024-03-11T14:09:55-0400 to 2024-03-25T14:09:55-0400)   Patient had a min HR of 57 bpm, max HR of 179 bpm, and avg HR of 98 bpm. Predominant  underlying rhythm was Sinus Rhythm. 1 run of Ventricular Tachycardia occurred lasting 7 beats with a max rate of 152 bpm (avg 126 bpm). 11 Supraventricular Tachycardia  runs occurred, the run with the fastest interval lasting 5 beats with a max rate of 179 bpm, the longest lasting 20 beats with an avg rate of 148 bpm. Isolated SVEs were rare (<1.0%), SVE Couplets were rare (<1.0%), and SVE Triplets were rare (<1.0%).  Isolated VEs were rare (<1.0%), VE Couplets were rare (<1.0%), and no VE Triplets were present.   Echocardiogram 02/2023: 1. Left ventricular ejection fraction, by estimation, is >75%. The left  ventricle has hyperdynamic function. The left ventricle has no regional  wall motion abnormalities. There is mild left ventricular hypertrophy.  Left ventricular diastolic parameters  are consistent with Grade I diastolic dysfunction (impaired relaxation).   2. Right ventricular systolic function is normal. The right ventricular  size is normal.   3. The mitral valve is normal in structure. No evidence of mitral valve  regurgitation. No evidence of mitral stenosis.   4. The aortic valve was not well visualized. Aortic valve regurgitation  is not visualized.   5. The inferior vena cava is normal in size with greater than 50%  respiratory variability, suggesting right atrial pressure of 3 mmHg.   Comparison(s): Nuclear stress test done 08/03/21 showed an EF of 80%.  Echocardiogram done 07/11/18 showed an EF of 60-65%.  Myoview  01/2023:   The study is normal. The study is low risk.   No ST deviation was noted. The ECG was negative for ischemia.   LV perfusion is normal.  No significant myocardial perfusion defects to indicate scar or ischemia.   Left ventricular function is normal. Nuclear stress EF: 85 %.   Low risk study with no significant myocardial perfusion defects to indicate scar or ischemia.  LVEF 85%.   Lexiscan  on August 03, 2021:   The study is low risk.   No ST deviation  was noted. The ECG was negative for ischemia.   LV perfusion is abnormal. Defect 1: There is a small defect with mild reduction in uptake present in the mid inferior location(s) that is partially reversible. There is normal wall motion in the defect area. Consistent with mild ischemia.   Left ventricular function is normal. Nuclear stress EF: 80 %.   Small, partially reversible, mid inferior wall defect suggesting mild ischemic territory with vigorous LVEF 80%.  Low risk study.  Coronary CTA on February 04, 2020: IMPRESSION: 1. Coronary artery calcium  score 45 Agatston units. This places the patient in the 85th percentile for age and gender, suggesting high risk for future cardiac events.   2.  Nonobstructive mild CAD noted.  Echocardiogram on July 11, 2018: Study Conclusions   - Left ventricle: The cavity size was normal. Wall thickness was    normal. Systolic function was normal. The estimated ejection    fraction was in the range of 60% to 65%. Wall motion was normal;    there were no regional wall motion abnormalities. Left    ventricular diastolic function parameters were normal.  - Aortic valve: Mildly calcified annulus. Probably trileaflet.  - Mitral valve: Mildly calcified annulus. There was trivial  regurgitation.  - Right ventricle: The cavity size was mildly dilated.  - Right atrium: Central venous pressure (est): 3 mm Hg.  - Atrial septum: No defect or patent foramen ovale was identified.  - Tricuspid valve: There was trivial regurgitation.  - Pericardium, extracardiac: A prominent pericardial fat pad was    present.    Risk Assessment/Calculations:   The 10-year ASCVD risk score (Arnett DK, et al., 2019) is: 5%   Values used to calculate the score:     Age: 40 years     Clincally relevant sex: Female     Is Non-Hispanic African American: No     Diabetic: No     Tobacco smoker: Yes     Systolic Blood Pressure: 102 mmHg     Is BP treated: Yes     HDL  Cholesterol: 77 mg/dL     Total Cholesterol: 137 mg/dL   Review of Systems    All other systems reviewed and are otherwise negative except as noted above.  Physical Exam    VS:  There were no vitals taken for this visit. , BMI There is no height or weight on file to calculate BMI.  Wt Readings from Last 3 Encounters:  08/28/24 120 lb 6 oz (54.6 kg)  06/23/24 118 lb 9.6 oz (53.8 kg)  06/05/24 116 lb (52.6 kg)     GEN: Well nourished, well developed, in no acute distress. HEENT: normal. Neck: Supple, no JVD, carotid bruits, or masses. Cardiac: S1/S2, fast rate and normal rhythm, no murmurs, rubs, or gallops. No clubbing, cyanosis, edema.  Radials/PT 2+ and equal bilaterally.  Respiratory:  Respirations regular and unlabored, clear/diminished to auscultation bilaterally. MS: No deformity or atrophy. Skin: Warm and dry, no rash. Neuro:  Strength and sensation are intact. Tremors to bilateral arms  Psych: Normal affect.  Assessment & Plan    Atypical chest pain History of chest tightness and shortness of breath with exertion point to etiology of her COPD.  Reviewed Lexi scan from 1 year ago that was normal, low risk.  Echocardiogram that was performed recently was benign.  Recommend to follow-up with PCP and pulmonologist for further evaluation.  Care knee precautions discussed.  No medication changes at this time.  Hx of orthostatic syncope, near syncope, low/soft BP (hypotension) Etiology not clear, recurrent LOC/presyncope.  Was told in the past to be orthostatic in nature.  Previous negative orthostatics on exam.  Echocardiogram and Myoview  unremarkable.  Monitor was negative for any high degree AV block or significant pauses. Instructed her to take midodrine  2.5 mg TID. No other medication changes. Heart healthy diet and regular cardiovascular exercise encouraged.  ED precautions discussed. Not in acute distress/symptomatic today to require ED visit. Discussed conservative measures.   Will bring her back in 1 to 2 weeks for BP check.  2.  History of PSVT, tachycardia Denies any recent tachycardia or palpitations. Heart rate slightly elevated today.  Previous monitor report revealed 1 run of VT, lasting 7 beats, 11 SVT episodes with the longest episode lasting 20 beats, average heart rate 148 bpm. Not on any beta blocker d/t low BP's. Instructed her to start midodrine  2.5 mg TID.  Continue rest of medication regimen.  Discussed to avoid/limit caffeine/nicotine  use-see below.  Her healthy diet and regular cardiovascular exercise encouraged.  ED precautions discussed.   3. COPD, tobacco use Hx of hospitalizations for COPD exacerbation.  Continues to smoke, has weaned down to now smoking only few cigarettes per day.  I  congratulated her.  Smoking cessation encouraged and discussed. Continue to follow-up with Pulmonologist.   Disposition: Follow up in 6 weeks with Alvan Carrier, MD or APP.  Signed, Almarie Crate, NP

## 2024-09-04 NOTE — Patient Instructions (Addendum)

## 2024-09-10 DIAGNOSIS — R062 Wheezing: Secondary | ICD-10-CM | POA: Diagnosis not present

## 2024-10-05 ENCOUNTER — Telehealth: Payer: Self-pay | Admitting: Family Medicine

## 2024-10-05 NOTE — Telephone Encounter (Signed)
 Copied from CRM #[id number]. Topic: Clinical - Medication Prior Auth >> Oct 05, 2024 12:22 PM Carrielelia G wrote: Reason for CRM: budeson-glycopyrrolate-formoterol  (BREZTRI  AEROSPHERE) 160-9-4.8 MCG/ACT AERO  Patient Alicia Sanchez is asking if the re-enrollment authorization form for this medication has been completed and sent in?

## 2024-10-06 ENCOUNTER — Encounter: Payer: Self-pay | Admitting: Family

## 2024-10-06 ENCOUNTER — Ambulatory Visit (INDEPENDENT_AMBULATORY_CARE_PROVIDER_SITE_OTHER)

## 2024-10-06 ENCOUNTER — Ambulatory Visit (INDEPENDENT_AMBULATORY_CARE_PROVIDER_SITE_OTHER): Admitting: Family

## 2024-10-06 VITALS — BP 112/70 | HR 88 | Temp 97.6°F | Ht 64.0 in | Wt 118.6 lb

## 2024-10-06 DIAGNOSIS — J441 Chronic obstructive pulmonary disease with (acute) exacerbation: Secondary | ICD-10-CM

## 2024-10-06 DIAGNOSIS — J439 Emphysema, unspecified: Secondary | ICD-10-CM | POA: Diagnosis not present

## 2024-10-06 MED ORDER — AMOXICILLIN-POT CLAVULANATE 875-125 MG PO TABS: 1.0000 | ORAL_TABLET | Freq: Two times a day (BID) | ORAL | 0 refills | Status: DC

## 2024-10-06 MED ORDER — PREDNISONE 10 MG PO TABS: ORAL_TABLET | ORAL | 0 refills | Status: DC

## 2024-10-06 NOTE — Patient Instructions (Signed)
 Chronic Obstructive Pulmonary Disease Exacerbation  Chronic obstructive pulmonary disease (COPD) is a long-term (chronic) lung problem. When you have COPD, it can feel harder to breathe in or out. COPD exacerbation is a flare-up of symptoms when breathing gets worse and more treatment may be needed. Without treatment, flare-ups can be life-threatening. If they happen often, your lungs can become more damaged. What are the causes? Not taking your usual COPD medicines as told by your health care provider. A cold or the flu, which can cause infection in your lungs. Being exposed to things that make your breathing worse, such as: Smoke. Air pollution. Fumes. Dust. Allergies. Weather changes. What are the signs or symptoms? Symptoms do not get better or get worse even if you take your medicines as told by your provider. Symptoms may include: More shortness of breath. You may only be able to speak one or two words at a time. More coughing or mucus from your lungs. More wheezing or chest tightness. Being more tired and having less energy. Confusion. How is this diagnosed? This condition is diagnosed based on: Symptoms that get worse. Your medical history. A physical exam. You may also have tests, including: A chest X-ray. Blood or mucus tests. How is this treated? You may be able to stay home or you may need to go to the hospital. Treatment may include: Taking medicines. These may include: Inhalers. These have medicines in them that you breathe in. These may be more of what you already take or they may be new. Steroids. These reduce inflammation in the airways. These may be inhaled, taken by mouth, or given in an IV. Antibiotics. These treat infection. Using oxygen. Using a device to help you clear mucus. Follow these instructions at home: Medicines Take your medicines only as told by your provider. If you were given antibiotics or steroids, take them as told by your provider. Do  not stop taking them even if you start to feel better. Lifestyle Several times a day, wash your hands with soap and water for at least 20 seconds. If you cannot use soap and water, use hand sanitizer. This may help keep you from getting an infection. Avoid being around crowds or people who are sick. Do not smoke or use any products that contain nicotine or tobacco. If you need help quitting, ask your provider. Return to your normal activities when your provider says that it's safe. Use breathing methods to control your stress and catch your breath. How is this prevented? Follow your COPD action plan. The action plan tells you what to do if you're feeling good and what to do when you start feeling worse. Discuss the plan often with your provider. Make sure you get all the shots, also called vaccines, that your provider recommends. Ask your provider about a flu shot and a pneumonia shot. Use oxygen therapy if told by your provider. If you need home oxygen therapy, ask your provider how often to check your oxygen level with a device called an oximeter. Keep all follow-up visits to review your COPD action plan. Your provider will want to check on your condition often to keep you healthy and out of the hospital. Contact a health care provider if: Your COPD symptoms get worse. You have a fever or chills. You have trouble doing daily activities. You have trouble breathing even when you are resting. Get help right away if: You are short of breath and cannot: Talk in full sentences. Do normal activities. You have chest  pain. You feel confused. These symptoms may be an emergency. Call 911 right away. Do not wait to see if the symptoms will go away. Do not drive yourself to the hospital. This information is not intended to replace advice given to you by your health care provider. Make sure you discuss any questions you have with your health care provider. Document Revised: 08/08/2023 Document  Reviewed: 01/21/2023 Elsevier Patient Education  2024 [employer].

## 2024-10-06 NOTE — Progress Notes (Signed)
 Subjective:    Patient ID: Alicia Sanchez, female    DOB: 1961-07-01, 63 y.o.   MRN: [id number]  Chief Complaint  Patient presents with   COPD   PT presents to the office today with COPD exacerbation that started two days.   She is currently taking albuterol , Breztril BID , Duoneb every 4 hours.   She continue to smoke 1/2 pack a day.   Has been hospitalized in 06/22/24 for COPD. Followed by Pulmonologist.  COPD She complains of chest tightness, cough, shortness of breath and sputum production. There is no wheezing. This is a chronic problem. The current episode started in the past 7 days. The problem has been rapidly worsening. The cough is productive. Associated symptoms include dyspnea on exertion, malaise/fatigue, nasal congestion, rhinorrhea, sneezing and a sore throat. Pertinent negatives include no ear congestion, ear pain, fever or headaches. Her symptoms are aggravated by exposure to smoke. Her symptoms are alleviated by rest, OTC inhaler and OTC cough suppressant. Her past medical history is significant for COPD.      Review of Systems  Constitutional:  Positive for malaise/fatigue. Negative for fever.  HENT:  Positive for rhinorrhea, sneezing and sore throat. Negative for ear pain.   Respiratory:  Positive for cough, sputum production and shortness of breath. Negative for wheezing.   Cardiovascular:  Positive for dyspnea on exertion.  Neurological:  Negative for headaches.  All other systems reviewed and are negative.   Social History   Socioeconomic History   Marital status: Married    Spouse name: Not on file   Number of children: 2   Years of education: Not on file   Highest education level: GED or equivalent  Occupational History   Not on file  Tobacco Use   Smoking status: Every Day    Current packs/day: 0.50    Average packs/day: 0.5 packs/day for 42.0 years (21.0 ttl pk-yrs)    Types: Cigarettes   Smokeless tobacco: Never   Tobacco comments:     Smoking 4-5 cigarettes.  11/28/2023 hfb  Vaping Use   Vaping status: Never Used  Substance and Sexual Activity   Alcohol use: Yes    Comment: soical   Drug use: Yes    Frequency: 1.0 times per week    Types: Marijuana    Comment: not in few weeks   Sexual activity: Yes    Birth control/protection: Surgical  Other Topics Concern   Not on file  Social History Narrative   Not on file   Social Drivers of Health   Financial Resource Strain: Medium Risk (08/28/2024)   Overall Financial Resource Strain (CARDIA)    Difficulty of Paying Living Expenses: Somewhat hard  Food Insecurity: Food Insecurity Present (08/28/2024)   Hunger Vital Sign    Worried About Running Out of Food in the Last Year: Sometimes true    Ran Out of Food in the Last Year: Never true  Transportation Needs: No Transportation Needs (08/28/2024)   PRAPARE - Administrator, Civil Service (Medical): No    Lack of Transportation (Non-Medical): No  Physical Activity: Inactive (08/28/2024)   Exercise Vital Sign    Days of Exercise per Week: 0 days    Minutes of Exercise per Session: Not on file  Stress: Stress Concern Present (08/28/2024)   [employer] of Occupational Health - Occupational Stress Questionnaire    Feeling of Stress: Rather much  Social Connections: Socially Isolated (08/28/2024)   Social Connection and Isolation Panel  Frequency of Communication with Friends and Family: More than three times a week    Frequency of Social Gatherings with Friends and Family: Once a week    Attends Religious Services: Patient declined    Database Administrator or Organizations: No    Attends Engineer, Structural: Not on file    Marital Status: Separated   Family History  Problem Relation Age of Onset   COPD Mother    COPD Father    Diabetes type II Sister    Kidney failure Sister    Diabetes type II Brother    COPD Maternal Grandfather    Breast cancer Neg Hx    Liver disease Neg Hx     Esophageal cancer Neg Hx    Colon cancer Neg Hx         Objective:   Physical Exam Vitals reviewed.  Constitutional:      General: She is not in acute distress.    Appearance: She is well-developed.  HENT:     Head: Normocephalic and atraumatic.     Right Ear: Tympanic membrane normal.     Left Ear: Tympanic membrane normal.  Eyes:     Pupils: Pupils are equal, round, and reactive to light.  Neck:     Thyroid : No thyromegaly.  Cardiovascular:     Rate and Rhythm: Normal rate and regular rhythm.     Heart sounds: Normal heart sounds. No murmur heard. Pulmonary:     Effort: Pulmonary effort is normal. No respiratory distress.     Breath sounds: Rhonchi present. No wheezing.     Comments: Coarse nonproductive cough Abdominal:     General: Bowel sounds are normal. There is no distension.     Palpations: Abdomen is soft.     Tenderness: There is no abdominal tenderness.  Musculoskeletal:        General: No tenderness. Normal range of motion.     Cervical back: Normal range of motion and neck supple.  Skin:    General: Skin is warm and dry.  Neurological:     Mental Status: She is alert and oriented to person, place, and time.     Cranial Nerves: No cranial nerve deficit.     Deep Tendon Reflexes: Reflexes are normal and symmetric.  Psychiatric:        Behavior: Behavior normal.        Thought Content: Thought content normal.        Judgment: Judgment normal.       BP 112/70   Pulse 88   Temp 97.6 F (36.4 C) (Temporal)   Ht 5' 4 (1.626 m)   Wt 118 lb 9.6 oz (53.8 kg)   SpO2 92%   BMI 20.36 kg/m      Assessment & Plan:  Alicia Sanchez comes in today with chief complaint of COPD   Diagnosis and orders addressed:  1. COPD exacerbation (HCC) (Primary) Continue albuterol , Breztril BID , Duoneb every 4 hours.  PT encouraged to call Pulmonologist to make follow up Smoking cessation discussed  Follow up if symptoms worsen or do not improve   - DG Chest 2  View; Future - amoxicillin -clavulanate (AUGMENTIN ) 875-125 MG tablet; Take 1 tablet by mouth 2 (two) times daily.  Dispense: 14 tablet; Refill: 0 - predniSONE  (DELTASONE ) 10 MG tablet; Days 1-4 take 4 tablets (40 mg) daily  Days 5-8 take 3 tablets (30 mg) daily, Days 9-11 take 2 tablets (20 mg) daily, Days 12-14 take 1  tablet (10 mg) daily.  Dispense: 37 tablet; Refill: 0      Bari Learn, FNP

## 2024-10-07 ENCOUNTER — Encounter: Payer: Self-pay | Admitting: Primary Care

## 2024-10-07 ENCOUNTER — Ambulatory Visit: Admitting: Primary Care

## 2024-10-07 ENCOUNTER — Other Ambulatory Visit: Payer: Self-pay | Admitting: Pulmonary Disease

## 2024-10-07 VITALS — BP 132/62 | HR 123 | Temp 97.8°F | Ht 64.0 in | Wt 120.4 lb

## 2024-10-07 DIAGNOSIS — R000 Tachycardia, unspecified: Secondary | ICD-10-CM | POA: Diagnosis not present

## 2024-10-07 DIAGNOSIS — F419 Anxiety disorder, unspecified: Secondary | ICD-10-CM

## 2024-10-07 DIAGNOSIS — F411 Generalized anxiety disorder: Secondary | ICD-10-CM

## 2024-10-07 DIAGNOSIS — F17200 Nicotine dependence, unspecified, uncomplicated: Secondary | ICD-10-CM

## 2024-10-07 DIAGNOSIS — J441 Chronic obstructive pulmonary disease with (acute) exacerbation: Secondary | ICD-10-CM

## 2024-10-07 DIAGNOSIS — F17210 Nicotine dependence, cigarettes, uncomplicated: Secondary | ICD-10-CM | POA: Diagnosis not present

## 2024-10-07 DIAGNOSIS — I959 Hypotension, unspecified: Secondary | ICD-10-CM

## 2024-10-07 DIAGNOSIS — J439 Emphysema, unspecified: Secondary | ICD-10-CM | POA: Diagnosis not present

## 2024-10-07 NOTE — Patient Instructions (Addendum)
    VISIT SUMMARY: Today, you were seen for acute shortness of breath, which began three days ago and was accompanied by a new cough producing yellow mucus. You also experienced pleuritic pain on the right side with coughing and abdominal discomfort. 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 You recently started Augmentin  and prednisone . You are an active smoker and have expressed a desire to quit. You also have a history of low blood pressure and anxiety, especially after the recent loss of your mother.  YOUR PLAN: -SEVERE COPD WITH RECURRENT EXACERBATIONS AND EMPHYSEMA: You have severe COPD and emphysema, which are chronic lung conditions that make it hard to breathe. You are currently experiencing an acute exacerbation with symptoms of shortness of breath, cough, and yellow mucus. Continue taking Augmentin  and prednisone  as prescribed. Use your nebulizer three times a day for the next 4-5 days and wear your oxygen  continuously for the next week. You may also consider taking Mucinex  or Mucinex  DM to help manage your cough. We discussed long-term management options, including azithromycin  or PDE4 inhibitors, to prevent future exacerbations.  -TOBACCO USE DISORDER: You are currently smoking, which is contributing to your lung problems. We discussed various options to help you quit smoking, including nicotine  replacement therapy, Chantix , and Wellbutrin. Chantix  has a higher success rate but may cause side effects like nausea and sleep problems. Wellbutrin has a lower success rate. Nicotine  patches are also an option if they are covered by your insurance. We will discuss these options further at your follow-up visit.  -HYPOTENSION: You have low blood pressure, which is being managed with midodrine  as needed. Continue taking midodrine  when you experience symptoms of low blood pressure.  -TACHYCARDIA: Your heart rate is  elevated, likely due to anxiety and your COPD exacerbation. We will continue to monitor your heart rate and symptoms.  -ANXIETY DISORDER: You are experiencing anxiety, which is contributing to your elevated heart rate and COPD symptoms. This anxiety is likely worsened by the recent loss of your mother. We will continue to monitor your anxiety and discuss further management options if needed.  INSTRUCTIONS: Please continue taking Augmentin  and prednisone  as prescribed. Use your nebulizer three times a day for the next 4-5 days and wear your oxygen  continuously for the next week. Consider taking Mucinex  or Mucinex  DM for your cough. We will discuss smoking cessation options at your follow-up visit. Continue taking midodrine  as needed for low blood pressure. We will monitor your heart rate and anxiety symptoms. We will follow-up with Dr. [name] regarding long-term management options for your COPD.   Call 1-800-QUIT-NOW for free month of nicotine  patches  Follow-up 6 weeks with Landry NP or Dr. [name]

## 2024-10-07 NOTE — Telephone Encounter (Unsigned)
 Copied from CRM #[id number]. Topic: Clinical - Medication Refill >> Oct 07, 2024  4:42 PM Lavanda D wrote: Medication: ipratropium-albuterol  (DUONEB) 0.5-2.5 (3) MG/3ML SOLN  Has the patient contacted their pharmacy? Yes, previously got through mail order now wants @ [facility].  (Agent: If no, request that the patient contact the pharmacy for the refill. If patient does not wish to contact the pharmacy document the reason why and proceed with request.) (Agent: If yes, when and what did the pharmacy advise?)  This is the patient's preferred pharmacy:  [facility] [address], [place] - [address] [place] [address] Phone: [phone] Fax: [phone]  Is this the correct pharmacy for this prescription? Yes If no, delete pharmacy and type the correct one.   Has the prescription been filled recently? No  Is the patient out of the medication? No  Has the patient been seen for an appointment in the last year OR does the patient have an upcoming appointment? Yes  Can we respond through MyChart? Yes  Agent: Please be advised that Rx refills may take up to 3 business days. We ask that you follow-up with your pharmacy.

## 2024-10-07 NOTE — Progress Notes (Signed)
 "  @Patient  ID: Alicia Sanchez, female    DOB: 01-30-1961, 62 y.o.   MRN: [id number]  Chief Complaint  Patient presents with   COPD   Medical Management of Chronic Issues    Respiratory failure. 2L O2 POC and 2L O2 HS     Referring provider: Jolinda Norene HERO, DO  HPI: 63 year old female active smoker followed for COPD with emphysema and chronic respiratory failure on oxygen  2 L with activity and at bedtime  Pets:Dog which lives outside the house. No pets, exotic pets. Occupation:Customer sevice rep. previously worked in [employer] Exposures: His current dust exposure and previous exposure to cotton fiber in her line of work Smoking history: 35-pack-year smoking history. Continues to smoke 1 pack per day   10/07/2024- Interim hx  Discussed the use of AI scribe software for clinical note transcription with the patient, who gave verbal consent to proceed.  History of Present Illness Alicia Sanchez is a 63 year old female with severe COPD and emphysema who presents with acute shortness of breath.  Acute shortness of breath began three days ago, accompanied by a new onset cough productive of yellow mucus. She experiences pleuritic pain on the right side with coughing and abdominal discomfort described as her stomach 'turning into a knot' when she coughs. No chronic cough or hemoptysis. Pain is present with deep breaths and her chest feels tight.  She is currently on Breztri , taking two puffs twice a day, and uses a nebulizer every four to six hours. Albuterol  is used as a rescue inhaler but is perceived as ineffective. No doses of her maintenance inhaler have been missed. She started Augmentin  and prednisone  last night, with two doses of Augmentin  taken so far. The prednisone  regimen is 40 mg for three days, tapering down over a total of 12 days.  She is an active smoker and wants to quit, having previously quit in the past. She has a portable oxygen  tank at home and uses it as needed,  especially during exertion or at night. She reports that her oxygen  levels are sometimes low and that she has anxiety.  She has a history of low blood pressure, typically around 90/60, and takes midodrine  as needed. She has experienced exacerbations in the past, often resulting in bronchitis with purulent mucus and shortness of breath, requiring hospitalization in June and August.  She recently lost her mother and is still coping with the loss, which has contributed to her anxiety.   Allergies  Allergen Reactions   Doxycycline  Other (See Comments)    Chest pain    Immunization History  Administered Date(s) Administered   Influenza Split 08/25/2016   Influenza, Seasonal, Injecte, Preservative Fre 01/17/2024, 08/28/2024   Influenza,inj,Quad PF,6+ Mos 09/25/2017, 07/16/2018, 10/25/2020, 08/30/2021, 09/24/2022   Influenza-Unspecified 09/25/2017, 07/16/2018, 08/30/2021   Moderna Sars-Covid-2 Vaccination 01/19/2020, 02/16/2020   PNEUMOCOCCAL CONJUGATE-20 01/17/2024   Pneumococcal Polysaccharide-23 05/23/2018   Respiratory Syncytial Virus Vaccine ,Recomb Aduvanted(Arexvy ) 09/24/2022   Tdap 05/19/2007, 07/08/2014, 08/28/2024    Past Medical History:  Diagnosis Date   Anxiety    Aortic atherosclerosis    Chronic kidney disease    COPD (chronic obstructive pulmonary disease) (HCC)    Depression    Headache    History of chest pain    History of syncope    Hypotension    Osteoporosis    Palpitations    Panic attacks    Pneumonia    Restless legs syndrome (RLS)     Tobacco History: Social History  Tobacco Use  Smoking Status Every Day   Current packs/day: 0.50   Average packs/day: 0.5 packs/day for 42.0 years (21.0 ttl pk-yrs)   Types: Cigarettes  Smokeless Tobacco Never  Tobacco Comments   Smoking 4-5 cigarettes.  11/28/2023 hfb   Ready to quit: Not Answered Counseling given: Not Answered Tobacco comments: Smoking 4-5 cigarettes.  11/28/2023 hfb   Outpatient Medications  Prior to Visit  Medication Sig Dispense Refill   acetaminophen  (TYLENOL ) 500 MG tablet Take 1,000 mg by mouth every 6 (six) hours as needed for moderate pain.     albuterol  (VENTOLIN  HFA) 108 (90 Base) MCG/ACT inhaler INHALE 2 PUFFS BY MOUTH EVERY 6 HOURS AS NEEDED FOR WHEEZING FOR SHORTNESS OF BREATH 9 g 2   alendronate  (FOSAMAX ) 70 MG tablet TAKE 1 TABLET EVERY 7 DAYS WITH A FULL GLASS OF WATER ON AN EMPTY STOMACH 12 tablet 3   ALPRAZolam  (XANAX ) 0.5 MG tablet Take 1 tablet (0.5 mg total) by mouth 4 (four) times daily. 120 tablet 2   amoxicillin -clavulanate (AUGMENTIN ) 875-125 MG tablet Take 1 tablet by mouth 2 (two) times daily. 14 tablet 0   aspirin  EC 81 MG tablet Take 81 mg by mouth daily.     atorvastatin  (LIPITOR) 40 MG tablet Do not restart until ok by primary care provider 90 tablet 3   budeson-glycopyrrolate-formoterol  (BREZTRI  AEROSPHERE) 160-9-4.8 MCG/ACT AERO Inhale 2 puffs into the lungs 2 (two) times daily. 32.1 g 5   cetirizine  (ZYRTEC ) 10 MG tablet Take 10 mg by mouth daily.     Cholecalciferol (VITAMIN D3 MAXIMUM STRENGTH) 125 MCG (5000 UT) capsule Take 5,000 Units by mouth daily.     DULoxetine  (CYMBALTA ) 60 MG capsule Take 2 capsules (120 mg total) by mouth daily. 180 capsule 3   ipratropium-albuterol  (DUONEB) 0.5-2.5 (3) MG/3ML SOLN INHALE THE CONTENTS OF 1 VIAL VIA NEBULIZER EVERY 6 HOURS AS NEEDED (Patient taking differently: Take 3 mLs by nebulization every 4 (four) hours as needed (Shortness of breath).) 360 mL 11   midodrine  (PROAMATINE ) 10 MG tablet Take 1 tablet (10 mg total) by mouth 3 (three) times daily with meals. 90 tablet 0   nitroGLYCERIN  (NITROSTAT ) 0.4 MG SL tablet Place 1 tablet (0.4 mg total) under the tongue every 5 (five) minutes x 3 doses as needed for chest pain (if o relief after 2nd dose, proceed to the ED for an evaluation or call 911). 25 tablet 2   predniSONE  (DELTASONE ) 10 MG tablet Days 1-4 take 4 tablets (40 mg) daily  Days 5-8 take 3 tablets (30  mg) daily, Days 9-11 take 2 tablets (20 mg) daily, Days 12-14 take 1 tablet (10 mg) daily. 37 tablet 0   QUEtiapine  (SEROQUEL ) 200 MG tablet Take 1 tablet (200 mg total) by mouth at bedtime. 90 tablet 1   Respiratory Therapy Supplies (NEBULIZER/TUBING/MOUTHPIECE) KIT 1 Units by Does not apply route 4 (four) times daily. GIVE face mask and tubing for nebulizer. J44.1 1 kit 0   rOPINIRole  (REQUIP ) 3 MG tablet Take 1 tablet (3 mg total) by mouth at bedtime. 90 tablet 3   No facility-administered medications prior to visit.   Review of Systems  Review of Systems  Respiratory:  Positive for cough and shortness of breath.    Physical Exam  BP 132/62   Pulse (!) 123   Temp 97.8 F (36.6 C)   Ht 5' 4 (1.626 m)   Wt 120 lb 6.4 oz (54.6 kg)   SpO2 91% Comment: RA  BMI  20.67 kg/m  Physical Exam Constitutional:      General: She is not in acute distress.    Appearance: Normal appearance. She is not ill-appearing.  HENT:     Head: Normocephalic and atraumatic.  Cardiovascular:     Rate and Rhythm: Normal rate and regular rhythm.  Pulmonary:     Effort: Pulmonary effort is normal. No respiratory distress.     Breath sounds: No stridor. Wheezing and rhonchi present.  Musculoskeletal:        General: Normal range of motion.  Skin:    General: Skin is warm and dry.  Neurological:     General: No focal deficit present.     Mental Status: She is alert and oriented to person, place, and time. Mental status is at baseline.  Psychiatric:        Mood and Affect: Mood normal.        Behavior: Behavior normal.        Thought Content: Thought content normal.        Judgment: Judgment normal.      Lab Results:  CBC    Component Value Date/Time   WBC 8.5 08/28/2024 1444   WBC 18.8 (H) 06/24/2024 0449   RBC 4.59 08/28/2024 1444   RBC 3.78 (L) 06/24/2024 0449   HGB 14.3 08/28/2024 1444   HCT 42.7 08/28/2024 1444   PLT 328 08/28/2024 1444   MCV 93 08/28/2024 1444   MCH 31.2 08/28/2024  1444   MCH 31.7 06/24/2024 0449   MCHC 33.5 08/28/2024 1444   MCHC 34.5 06/24/2024 0449   RDW 12.1 08/28/2024 1444   LYMPHSABS 2.2 08/28/2024 1444   MONOABS 1.4 (H) 06/22/2024 1941   EOSABS 0.1 08/28/2024 1444   BASOSABS 0.1 08/28/2024 1444    BMET    Component Value Date/Time   NA 140 06/24/2024 0449   NA 140 05/01/2024 1240   K 3.8 06/24/2024 0449   CL 104 06/24/2024 0449   CO2 25 06/24/2024 0449   GLUCOSE 132 (H) 06/24/2024 0449   BUN 15 06/24/2024 0449   BUN 13 05/01/2024 1240   CREATININE 0.63 06/24/2024 0449   CALCIUM  8.1 (L) 06/24/2024 0449   GFRNONAA >60 06/24/2024 0449   GFRAA 91 06/03/2020 1505    BNP No results found for: BNP  ProBNP No results found for: PROBNP  Imaging: DG Chest 2 View Result Date: 10/07/2024 CLINICAL DATA:  COPD. EXAM: CHEST - 2 VIEW COMPARISON:  06/22/2024 FINDINGS: Background of emphysema. No focal consolidation, pleural effusion, pneumothorax. The cardiac silhouette is within normal limits. No acute osseous pathology. IMPRESSION: 1. No active cardiopulmonary disease. 2. Emphysema. Electronically Signed   By: Vanetta Chou M.D.   On: 10/07/2024 13:06     Assessment & Plan:  1. COPD with acute exacerbation (HCC) (Primary)  2. Current smoker  3. Hypotension, unspecified hypotension type  4. Generalized anxiety disorder    Assessment and Plan Assessment & Plan Severe COPD with recurrent exacerbations and emphysema presenting with acute purulent bronchitis Acute exacerbation of COPD with purulent bronchitis, presenting with shortness of breath, cough, and yellow sputum production. Chest x-ray shows emphysema without pneumonia, collapsed lung, or fluid in the lungs. Oxygen  saturation is low, and heart rate is elevated, likely due to anxiety and exacerbation. Differential includes bronchitis versus pneumonia, but chest x-ray rules out pneumonia. Current treatment includes Augmentin  and prednisone , which have been started.  Discussion of long-term management options including azithromycin  or PDE4 inhibitors to prevent recurrent exacerbations. Azithromycin  has  a risk of QTc prolongation, but no significant cardiac history noted. - Continue Augmentin  and prednisone  as prescribed. - Use ipratropium-albuterol  nebulizer three times a day for 4-5 days. - Wear oxygen  continuously for the next week. - Consider Mucinex  or Mucinex  DM for cough management. - Dr. [name] recommended adding Azithroymycin WMF, needs repeat EKG at follow-up   Tobacco use disorder Active smoker with a history of smoking cessation attempts. Current stressors include recent bereavement, making cessation challenging. Discussion of smoking cessation options including nicotine  replacement therapy and medications like Chantix  and Wellbutrin. Chantix  has a higher success rate but may cause nausea, sleep problems, and mood changes. Wellbutrin has a lower success rate. Nicotine  patches are an option if covered by insurance. - Will discuss smoking cessation options at follow-up. - Consider nicotine  replacement therapy if covered by insurance. - Consider tapering cigarette use gradually.  Hypotension Stable; BP 132/62. Currently managed with midodrine  as needed. No acute changes in management discussed. - Continue midodrine  as needed for hypotension.  Tachycardia Chronic; Elevated heart rate likely related to anxiety and COPD exacerbation. Most recent EKG in May and August 2025 showed sinus tachycardia. No significant cardiac history noted except for hypotension. Discussion of potential QTc prolongation with azithromycin , but no immediate concerns. - Continue to monitor heart rate and symptoms.  Anxiety disorder Current stressors include recent bereavement. She is still coping but managing ok and denies SI.   Smoking/Tobacco Cessation Counseling Alicia Sanchez is a current user of tobacco or nicotine  products. She is considering quitting at this time.  Counseling provided today addressed the risks of continued use and the benefits of cessation. Discussed tobacco/nicotine  use history, readiness to quit, and evidence-based treatment options including behavioral strategies, support resources, and pharmacologic therapies. Provided encouragement and educational materials on steps and resources to quit smoking. Patient questions were addressed, and follow-up recommended for continued support. Total time spent on counseling: 5 minutes.  I personally spent a total of 40 minutes in the care of the patient today including counseling and educating, documenting clinical information in the EHR, independently interpreting results, and communicating results.   Alicia LELON Ferrari, NP 11/16/2024 "

## 2024-10-07 NOTE — Telephone Encounter (Signed)
 Left message requesting call back regarding re-enrollment - [phone]  No application rec'd from patient.

## 2024-10-08 ENCOUNTER — Ambulatory Visit: Payer: Self-pay | Admitting: Family

## 2024-10-08 MED ORDER — IPRATROPIUM-ALBUTEROL 0.5-2.5 (3) MG/3ML IN SOLN: RESPIRATORY_TRACT | 3 refills | Status: DC

## 2024-10-09 ENCOUNTER — Telehealth: Payer: Self-pay | Admitting: *Deleted

## 2024-10-09 DIAGNOSIS — J441 Chronic obstructive pulmonary disease with (acute) exacerbation: Secondary | ICD-10-CM

## 2024-10-09 NOTE — Telephone Encounter (Signed)
 Yes we can place order for new nebulizer machine re: COPD

## 2024-10-09 NOTE — Telephone Encounter (Signed)
 ATC x1. LDVM (DPR) stating neb order has been placed.

## 2024-10-09 NOTE — Telephone Encounter (Signed)
 ATC X2. LMTCB. I will send mychart message then completing note. NFN

## 2024-10-09 NOTE — Telephone Encounter (Signed)
 Last office visit 10/07/24 pt requesting new nebulizer as the one she has is outdated.  Assessment & Plan  - Continue Augmentin  and prednisone  as prescribed. - Use nebulizer three times a day for 4-5 days. - Wear oxygen  continuously for the next week. - Consider Mucinex  or Mucinex  DM for cough management. - Consulted with Dr. Janece regarding long-term management options (azithromycin  or PDE4 inhibitors).

## 2024-10-11 DIAGNOSIS — J449 Chronic obstructive pulmonary disease, unspecified: Secondary | ICD-10-CM | POA: Diagnosis not present

## 2024-10-11 DIAGNOSIS — R062 Wheezing: Secondary | ICD-10-CM | POA: Diagnosis not present

## 2024-10-19 ENCOUNTER — Telehealth: Payer: Self-pay | Admitting: Primary Care

## 2024-10-19 MED ORDER — AZITHROMYCIN 250 MG PO TABS: 250.0000 mg | ORAL_TABLET | ORAL | 3 refills | Status: DC

## 2024-10-19 NOTE — Telephone Encounter (Signed)
 Please let patient know I spoke with Dr. [name] and he recommended adding Azithromycin  MWF to decrease recurrent exacerbations. I will send in prescription, will need repeat EKG at follow-up

## 2024-10-19 NOTE — Telephone Encounter (Signed)
 I called and spoke to pt. Pt informed of beth's note and pt verbalized understanding. NFN

## 2024-10-19 NOTE — Telephone Encounter (Signed)
-----   Message from Praveen Mannam sent at 10/08/2024  6:08 PM EST ----- Regarding: RE: Severe COPD with recurrent exacerbation I would do azithromycin  on Monday Wednesday Friday.  I think it is better tolerated than daily azithromycin   I generally use azithromycin  for recurrent exacerbations and PDE 4 inhibitor for symptom management. ----- Message ----- From: Hope Almarie ORN, NP Sent: 10/07/2024   1:18 PM EST To: Lonna Coder, MD Subject: Severe COPD with recurrent exacerbation        Patient has severe COPD with recurrent exacerbations with purulent bronchitis   Only cardiac history is hypotension and sinus tachycardia. She takes midodrine  as needed. Her heart rate runs about 130s. QT interval was 310 on most recent EKG in August.   Would you rather add azithromycin  daily (or MWF) or PDE4 inhibitor?   -[employer]er, [employer]

## 2024-10-20 DIAGNOSIS — J449 Chronic obstructive pulmonary disease, unspecified: Secondary | ICD-10-CM | POA: Diagnosis not present

## 2024-11-04 ENCOUNTER — Other Ambulatory Visit: Payer: Self-pay | Admitting: Family

## 2024-11-04 ENCOUNTER — Other Ambulatory Visit: Payer: Self-pay | Admitting: Adult Health

## 2024-11-04 DIAGNOSIS — J441 Chronic obstructive pulmonary disease with (acute) exacerbation: Secondary | ICD-10-CM

## 2024-11-04 DIAGNOSIS — F411 Generalized anxiety disorder: Secondary | ICD-10-CM

## 2024-11-04 DIAGNOSIS — F410 Panic disorder [episodic paroxysmal anxiety] without agoraphobia: Secondary | ICD-10-CM

## 2024-11-04 NOTE — Telephone Encounter (Signed)
 Please contact the patient to schedule her next appointment.  Thank you

## 2024-11-06 NOTE — Telephone Encounter (Signed)
 Pt is scheduled 11/23/24

## 2024-11-10 DIAGNOSIS — J449 Chronic obstructive pulmonary disease, unspecified: Secondary | ICD-10-CM | POA: Diagnosis not present

## 2024-11-10 DIAGNOSIS — R062 Wheezing: Secondary | ICD-10-CM | POA: Diagnosis not present

## 2024-11-23 ENCOUNTER — Encounter: Payer: Self-pay | Admitting: Adult Health

## 2024-11-23 ENCOUNTER — Telehealth (INDEPENDENT_AMBULATORY_CARE_PROVIDER_SITE_OTHER): Admitting: Adult Health

## 2024-11-23 ENCOUNTER — Ambulatory Visit: Payer: Self-pay

## 2024-11-23 DIAGNOSIS — F411 Generalized anxiety disorder: Secondary | ICD-10-CM

## 2024-11-23 DIAGNOSIS — F410 Panic disorder [episodic paroxysmal anxiety] without agoraphobia: Secondary | ICD-10-CM | POA: Diagnosis not present

## 2024-11-23 DIAGNOSIS — F331 Major depressive disorder, recurrent, moderate: Secondary | ICD-10-CM

## 2024-11-23 DIAGNOSIS — F329 Major depressive disorder, single episode, unspecified: Secondary | ICD-10-CM | POA: Diagnosis not present

## 2024-11-23 DIAGNOSIS — F4310 Post-traumatic stress disorder, unspecified: Secondary | ICD-10-CM | POA: Diagnosis not present

## 2024-11-23 DIAGNOSIS — G4700 Insomnia, unspecified: Secondary | ICD-10-CM

## 2024-11-23 NOTE — Progress Notes (Signed)
 Alicia Sanchez [id number] 28-Dec-1960 64 y.o.  Virtual Visit via Video Note  I connected with pt @ on 11/23/2024 at  2:00 PM EST by a video enabled telemedicine application and verified that I am speaking with the correct person using two identifiers.   I discussed the limitations of evaluation and management by telemedicine and the availability of in person appointments. The patient expressed understanding and agreed to proceed.  I discussed the assessment and treatment plan with the patient. The patient was provided an opportunity to ask questions and all were answered. The patient agreed with the plan and demonstrated an understanding of the instructions.   The patient was advised to call back or seek an in-person evaluation if the symptoms worsen or if the condition fails to improve as anticipated.  I provided 25 minutes of non-face-to-face time during this encounter.  The patient was located at home.  The provider was located at [facility].   Alicia LOISE Sayers, NP   Subjective:   Patient ID:  Alicia Sanchez is a 64 y.o. (DOB [birth year]) female.  Chief Complaint: No chief complaint on file.   HPI Alicia Sanchez presents for follow-up of GAD, MDD, PTSD, insomnia and panic attacks.  Describes mood today as ok. Pleasant. Denies tearfulness. Mood symptoms - reports depression and anxiety - I can't do what I want to do. Reports ongoing situational stressors with loss of mother - health issues. Denies irritability. Reports lower interest and motivation. Reports panic attacks - taking Xanax  3 times a day most days. Reports worry, rumination and over thinking - always. Reports mood is variable - all over the place. Stating I feel like I'm coming around - trying to get back from losing my momma - it took a toll on me. Feels like current medications are helpful. Taking medication as prescribed.                                                              Energy levels lower.  Active, does not have a regular exercise routine.   Enjoys some usual interests and activities. Married, but legally separated. Lives alone with dog. Has two daughters and 2 step daughters. Has 3 sisters and 2 brothers. Mother passed away from cancer in 16-Sep-2025. Spending time with family. Involved in church. Appetite adequate. Weight stable - 118 pounds. Sleeping better some nights than others. Averages 8 or more hours. Reports daytime napping. Reports focus and concentration improved - it's been better.  Managing aspects of household. Disabled since 2020.  Denies SI or HI.  Denies AH or VH. Denies self harm. Denies substance use.  Previous medication trials: Paxil , Wellbutrin, Depakote, Trazadone and others   Review of Systems:  Review of Systems  Musculoskeletal:  Negative for gait problem.  Neurological:  Negative for tremors.  Psychiatric/Behavioral:         Please refer to HPI    Medications: I have reviewed the patient's current medications.  Current Outpatient Medications  Medication Sig Dispense Refill   acetaminophen  (TYLENOL ) 500 MG tablet Take 1,000 mg by mouth every 6 (six) hours as needed for moderate pain.     albuterol  (VENTOLIN  HFA) 108 (90 Base) MCG/ACT inhaler INHALE 2 PUFFS BY MOUTH EVERY 6 HOURS AS NEEDED FOR WHEEZING FOR SHORTNESS OF BREATH 9 g  2   alendronate  (FOSAMAX ) 70 MG tablet TAKE 1 TABLET EVERY 7 DAYS WITH A FULL GLASS OF WATER ON AN EMPTY STOMACH 12 tablet 3   ALPRAZolam  (XANAX ) 0.5 MG tablet Take 1 tablet by mouth 4 times daily 72 tablet 0   amoxicillin -clavulanate (AUGMENTIN ) 875-125 MG tablet Take 1 tablet by mouth 2 (two) times daily. 14 tablet 0   aspirin  EC 81 MG tablet Take 81 mg by mouth daily.     atorvastatin  (LIPITOR) 40 MG tablet Do not restart until ok by primary care provider 90 tablet 3   azithromycin  (ZITHROMAX ) 250 MG tablet Take 1 tablet (250 mg total) by mouth every Monday, Wednesday, and Friday. 12 tablet 3    budeson-glycopyrrolate-formoterol  (BREZTRI  AEROSPHERE) 160-9-4.8 MCG/ACT AERO Inhale 2 puffs into the lungs 2 (two) times daily. 32.1 g 5   cetirizine  (ZYRTEC ) 10 MG tablet Take 10 mg by mouth daily.     Cholecalciferol (VITAMIN D3 MAXIMUM STRENGTH) 125 MCG (5000 UT) capsule Take 5,000 Units by mouth daily.     DULoxetine  (CYMBALTA ) 60 MG capsule Take 2 capsules (120 mg total) by mouth daily. 180 capsule 3   ipratropium-albuterol  (DUONEB) 0.5-2.5 (3) MG/3ML SOLN INHALE THE CONTENTS OF 1 VIAL VIA NEBULIZER EVERY 6 HOURS AS NEEDED 360 mL 3   midodrine  (PROAMATINE ) 10 MG tablet Take 1 tablet (10 mg total) by mouth 3 (three) times daily with meals. 90 tablet 0   nitroGLYCERIN  (NITROSTAT ) 0.4 MG SL tablet Place 1 tablet (0.4 mg total) under the tongue every 5 (five) minutes x 3 doses as needed for chest pain (if o relief after 2nd dose, proceed to the ED for an evaluation or call 911). 25 tablet 2   predniSONE  (DELTASONE ) 10 MG tablet Days 1-4 take 4 tablets (40 mg) daily  Days 5-8 take 3 tablets (30 mg) daily, Days 9-11 take 2 tablets (20 mg) daily, Days 12-14 take 1 tablet (10 mg) daily. 37 tablet 0   QUEtiapine  (SEROQUEL ) 200 MG tablet Take 1 tablet (200 mg total) by mouth at bedtime. 90 tablet 1   Respiratory Therapy Supplies (NEBULIZER/TUBING/MOUTHPIECE) KIT 1 Units by Does not apply route 4 (four) times daily. GIVE face mask and tubing for nebulizer. J44.1 1 kit 0   rOPINIRole  (REQUIP ) 3 MG tablet Take 1 tablet (3 mg total) by mouth at bedtime. 90 tablet 3   No current facility-administered medications for this visit.    Medication Side Effects: None  Allergies: Allergies[1]  Past Medical History:  Diagnosis Date   Anxiety    Aortic atherosclerosis    Chronic kidney disease    COPD (chronic obstructive pulmonary disease) (HCC)    Depression    Headache    History of chest pain    History of syncope    Hypotension    Osteoporosis    Palpitations    Panic attacks    Pneumonia     Restless legs syndrome (RLS)     Family History  Problem Relation Age of Onset   COPD Mother    COPD Father    Diabetes type II Sister    Kidney failure Sister    Diabetes type II Brother    COPD Maternal Grandfather    Breast cancer Neg Hx    Liver disease Neg Hx    Esophageal cancer Neg Hx    Colon cancer Neg Hx     Social History   Socioeconomic History   Marital status: Legally Separated    Spouse name:  Not on file   Number of children: 2   Years of education: Not on file   Highest education level: GED or equivalent  Occupational History   Not on file  Tobacco Use   Smoking status: Every Day    Current packs/day: 0.50    Average packs/day: 0.5 packs/day for 42.0 years (21.0 ttl pk-yrs)    Types: Cigarettes   Smokeless tobacco: Never   Tobacco comments:    Smoking 4-5 cigarettes.  11/28/2023 hfb  Vaping Use   Vaping status: Never Used  Substance and Sexual Activity   Alcohol use: Yes    Comment: soical   Drug use: Yes    Frequency: 1.0 times per week    Types: Marijuana    Comment: not in few weeks   Sexual activity: Yes    Birth control/protection: Surgical  Other Topics Concern   Not on file  Social History Narrative   Not on file   Social Drivers of Health   Tobacco Use: High Risk (10/07/2024)   Patient History    Smoking Tobacco Use: Every Day    Smokeless Tobacco Use: Never    Passive Exposure: Not on file  Financial Resource Strain: Medium Risk (08/28/2024)   Overall Financial Resource Strain (CARDIA)    Difficulty of Paying Living Expenses: Somewhat hard  Food Insecurity: Food Insecurity Present (08/28/2024)   Epic    Worried About Programme Researcher, Broadcasting/film/video in the Last Year: Sometimes true    [employer] of Food in the Last Year: Never true  Transportation Needs: No Transportation Needs (08/28/2024)   Epic    Lack of Transportation (Medical): No    Lack of Transportation (Non-Medical): No  Physical Activity: Inactive (08/28/2024)   Exercise Vital  Sign    Days of Exercise per Week: 0 days    Minutes of Exercise per Session: Not on file  Stress: Stress Concern Present (08/28/2024)   [employer] of Occupational Health - Occupational Stress Questionnaire    Feeling of Stress: Rather much  Social Connections: Socially Isolated (08/28/2024)   Social Connection and Isolation Panel    Frequency of Communication with Friends and Family: More than three times a week    Frequency of Social Gatherings with Friends and Family: Once a week    Attends Religious Services: Patient declined    Active Member of Clubs or Organizations: No    Attends Engineer, Structural: Not on file    Marital Status: Separated  Intimate Partner Violence: Not At Risk (06/23/2024)   Epic    Fear of Current or Ex-Partner: No    Emotionally Abused: No    Physically Abused: No    Sexually Abused: No  Depression (PHQ2-9): Low Risk (10/06/2024)   Depression (PHQ2-9)    PHQ-2 Score: 4  Alcohol Screen: Low Risk (08/28/2024)   Alcohol Screen    Last Alcohol Screening Score (AUDIT): 1  Housing: Low Risk (08/28/2024)   Epic    Unable to Pay for Housing in the Last Year: No    Number of Times Moved in the Last Year: 0    Homeless in the Last Year: No  Utilities: Not At Risk (06/23/2024)   Epic    Threatened with loss of utilities: No  Health Literacy: Adequate Health Literacy (12/23/2023)   B1300 Health Literacy    Frequency of need for help with medical instructions: Never    Past Medical History, Surgical history, Social history, and Family history were reviewed and updated  as appropriate.   Please see review of systems for further details on the patient's review from today.   Objective:   Physical Exam:  There were no vitals taken for this visit.  Physical Exam Constitutional:      General: She is not in acute distress. Musculoskeletal:        General: No deformity.  Neurological:     Mental Status: She is alert and oriented to person,  place, and time.     Coordination: Coordination normal.  Psychiatric:        Attention and Perception: Attention and perception normal. She does not perceive auditory or visual hallucinations.        Mood and Affect: Mood normal. Mood is not anxious or depressed. Affect is not labile, blunt, angry or inappropriate.        Speech: Speech normal.        Behavior: Behavior normal.        Thought Content: Thought content normal. Thought content is not paranoid or delusional. Thought content does not include homicidal or suicidal ideation. Thought content does not include homicidal or suicidal plan.        Cognition and Memory: Cognition and memory normal.        Judgment: Judgment normal.     Comments: Insight intact     Lab Review:     Component Value Date/Time   NA 140 06/24/2024 0449   NA 140 05/01/2024 1240   K 3.8 06/24/2024 0449   CL 104 06/24/2024 0449   CO2 25 06/24/2024 0449   GLUCOSE 132 (H) 06/24/2024 0449   BUN 15 06/24/2024 0449   BUN 13 05/01/2024 1240   CREATININE 0.63 06/24/2024 0449   CALCIUM  8.1 (L) 06/24/2024 0449   PROT 7.5 06/22/2024 1941   PROT 6.7 04/28/2024 1447   ALBUMIN 3.9 06/22/2024 1941   ALBUMIN 4.3 04/28/2024 1447   AST 28 06/22/2024 1941   ALT 28 06/22/2024 1941   ALKPHOS 72 06/22/2024 1941   BILITOT 0.5 06/22/2024 1941   BILITOT 0.3 04/28/2024 1447   GFRNONAA >60 06/24/2024 0449   GFRAA 91 06/03/2020 1505       Component Value Date/Time   WBC 8.5 08/28/2024 1444   WBC 18.8 (H) 06/24/2024 0449   RBC 4.59 08/28/2024 1444   RBC 3.78 (L) 06/24/2024 0449   HGB 14.3 08/28/2024 1444   HCT 42.7 08/28/2024 1444   PLT 328 08/28/2024 1444   MCV 93 08/28/2024 1444   MCH 31.2 08/28/2024 1444   MCH 31.7 06/24/2024 0449   MCHC 33.5 08/28/2024 1444   MCHC 34.5 06/24/2024 0449   RDW 12.1 08/28/2024 1444   LYMPHSABS 2.2 08/28/2024 1444   MONOABS 1.4 (H) 06/22/2024 1941   EOSABS 0.1 08/28/2024 1444   BASOSABS 0.1 08/28/2024 1444    No results  found for: POCLITH, LITHIUM   No results found for: PHENYTOIN, PHENOBARB, VALPROATE, CBMZ   .res Assessment: Plan:    Plan:  PDMP reviewed  Xanax  0.5mg  twice daily to four times daily. Seroquel  200mg  at hs  PCP: Cymbalta  120mg  every morning  Refer to therapist  RTC 3 months  25 minutes spent dedicated to the care of this patient on the date of this encounter to include pre-visit review of records, ordering of medication, post visit documentation, and face-to-face time with the patient discussing GAD, MDD, PTSD, insomnia and panic attacks. Discussed continuing current medication regimen. Will plan to decrease Xanax  next visit - increased with passing of mother.  Patient advised to contact office with any questions, adverse effects, or acute worsening in signs and symptoms.  Discussed potential benefits, risk, and side effects of benzodiazepines to include potential risk of tolerance and dependence, as well as possible drowsiness. Advised patient not to drive if experiencing drowsiness and to take lowest possible effective dose to minimize risk of dependence and tolerance.  There are no diagnoses linked to this encounter.   Please see After Visit Summary for patient specific instructions.  Future Appointments  Date Time Provider Department Center  11/23/2024  2:00 PM Jamaury Gumz, Alicia Mattocks, NP [facility] None  11/25/2024  2:00 PM Hope Almarie ORN, NP [facility] [id number]  12/08/2024  2:45 PM Miriam Almarie, NP [facility]  12/23/2024 10:40 AM [facility]NUAL WELLNESS VISIT [facility] [address]  02/02/2025  2:00 PM Jolinda Norene HERO, DO [facility] [address]    No orders of the defined types were placed in this encounter.     -------------------------------      [1]  Allergies Allergen Reactions   Doxycycline  Other (See Comments)    Chest pain

## 2024-11-23 NOTE — Telephone Encounter (Signed)
 Offered appt with Oneil tomorrow at 9:30 but pt declined saying she has appt with Pulmonology 11/25/24 and will got to ED if she gets worse.

## 2024-11-23 NOTE — Telephone Encounter (Signed)
 FYI Only or Action Required?: Action required by provider: update on patient condition.  Patient was last seen in primary care on 10/06/2024 by Lavell Bari LABOR, FNP.  Called Nurse Triage reporting Shortness of Breath.  Symptoms began a week ago.  Triage Disposition: Call EMS 911 Now  Patient/caregiver understands and will follow disposition?: No, wishes to speak with PCP        Copied from CRM #[id number]. Topic: Clinical - Red Word Triage >> Nov 23, 2024  9:12 AM Diannia H wrote: Red Word that prompted transfer to Nurse Triage: Patient is calling because she is wanting to come in and get a shot steroids. She is sick and congested, she is having trouble breathing, she doesn't have a fever or anything at the moment. She states she has been sick going on a few weeks. When I asked the patient has she gone to the ER in the last 14 days because she wanted an appointment at first she said no and she's not going and said the same thing when I told her I would get a nurse on the line for her. Reason for Disposition  SEVERE difficulty breathing (e.g., struggling for each breath, speaks in single words)  Answer Assessment - Initial Assessment Questions This RN recommends pt goes to ED but pt refused, stating she wants to come in for a steroid shot. This RN notified [facility] of pt refusal of ED disposition. Pt call back number is [phone].   Pt wants a steroid shot Onset: x1 week Congestion  Productive cough (yellow/brown) started yesterday SOB, worsens with movement (severe per pt) Chest tightness with a deep breath Denies fever  Protocols used: Breathing Difficulty-A-AH

## 2024-11-24 ENCOUNTER — Encounter (HOSPITAL_COMMUNITY): Payer: Self-pay | Admitting: Emergency Medicine

## 2024-11-24 ENCOUNTER — Other Ambulatory Visit: Payer: Self-pay

## 2024-11-24 ENCOUNTER — Emergency Department (HOSPITAL_COMMUNITY): Admission: EM | Admit: 2024-11-24 | Discharge: 2024-11-24 | Disposition: A | Discharge status: Home / Self Care

## 2024-11-24 ENCOUNTER — Emergency Department (HOSPITAL_COMMUNITY)

## 2024-11-24 DIAGNOSIS — J441 Chronic obstructive pulmonary disease with (acute) exacerbation: Secondary | ICD-10-CM | POA: Diagnosis not present

## 2024-11-24 DIAGNOSIS — D72829 Elevated white blood cell count, unspecified: Secondary | ICD-10-CM | POA: Insufficient documentation

## 2024-11-24 DIAGNOSIS — R0602 Shortness of breath: Secondary | ICD-10-CM | POA: Diagnosis present

## 2024-11-24 DIAGNOSIS — Z7982 Long term (current) use of aspirin: Secondary | ICD-10-CM | POA: Diagnosis not present

## 2024-11-24 LAB — CBC
HCT: 48 % — ABNORMAL HIGH (ref 36.0–46.0)
Hemoglobin: 16.1 g/dL — ABNORMAL HIGH (ref 12.0–15.0)
MCH: 30.6 pg (ref 26.0–34.0)
MCHC: 33.5 g/dL (ref 30.0–36.0)
MCV: 91.3 fL (ref 80.0–100.0)
Platelets: 262 K/uL (ref 150–400)
RBC: 5.26 MIL/uL — ABNORMAL HIGH (ref 3.87–5.11)
RDW: 12.3 % (ref 11.5–15.5)
WBC: 15.3 K/uL — ABNORMAL HIGH (ref 4.0–10.5)
nRBC: 0 % (ref 0.0–0.2)

## 2024-11-24 LAB — BASIC METABOLIC PANEL WITH GFR
Anion gap: 6 (ref 5–15)
BUN: 14 mg/dL (ref 8–23)
CO2: 34 mmol/L — ABNORMAL HIGH (ref 22–32)
Calcium: 9.1 mg/dL (ref 8.9–10.3)
Chloride: 100 mmol/L (ref 98–111)
Creatinine, Ser: 0.88 mg/dL (ref 0.44–1.00)
GFR, Estimated: >60 mL/min
Glucose, Bld: 103 mg/dL — ABNORMAL HIGH (ref 70–99)
Potassium: 4.6 mmol/L (ref 3.5–5.1)
Sodium: 139 mmol/L (ref 135–145)

## 2024-11-24 LAB — PRO BRAIN NATRIURETIC PEPTIDE: Pro Brain Natriuretic Peptide: 69 pg/mL

## 2024-11-24 LAB — TROPONIN T, HIGH SENSITIVITY: Troponin T High Sensitivity: 16 ng/L (ref 0–19)

## 2024-11-24 MED ORDER — PREDNISONE 10 MG PO TABS: 40.0000 mg | ORAL_TABLET | Freq: Every day | ORAL | 0 refills | Status: DC

## 2024-11-24 MED ORDER — ALBUTEROL SULFATE (2.5 MG/3ML) 0.083% IN NEBU
INHALATION_SOLUTION | RESPIRATORY_TRACT | Status: AC
  Administered 2024-11-24: 2.5 mg
  Filled 2024-11-24: qty 3

## 2024-11-24 MED ORDER — METHYLPREDNISOLONE SODIUM SUCC 125 MG IJ SOLR
125.0000 mg | Freq: Once | INTRAMUSCULAR | Status: AC
  Administered 2024-11-24: 125 mg via INTRAVENOUS
  Filled 2024-11-24: qty 2

## 2024-11-24 MED ORDER — IPRATROPIUM-ALBUTEROL 0.5-2.5 (3) MG/3ML IN SOLN
3.0000 mL | Freq: Once | RESPIRATORY_TRACT | Status: AC
  Administered 2024-11-24: 3 mL via RESPIRATORY_TRACT
  Filled 2024-11-24: qty 3

## 2024-11-24 NOTE — Discharge Instructions (Addendum)
 Take your steroids as prescribed. Use your inhalers and breathing treatments as needed.

## 2024-11-24 NOTE — ED Triage Notes (Signed)
 Pt arrives via POV c/o increased shortness of breath for the past few days. Pt usually wears 2L [place] at home and has increased to 3L with no improvement.

## 2024-11-24 NOTE — ED Provider Notes (Signed)
 " [place] EMERGENCY DEPARTMENT AT [facility] Provider Note   CSN: [id number] Arrival date & time: 11/24/24  2114     Patient presents with: Shortness of Breath   Marchella Hibbard is a 64 y.o. female.  {Add pertinent medical, surgical, social history, OB history to HPI:5274} 64 year old female presents for evaluation of shortness of breath.  States this ongoing problem for a few days.  She is on 2 L nasal cannula at home but has been needing 3 the last few days.  She states she has been doing breathing treatments without much relief.  States this feels like her COPD.  Has been on antibiotics every other day to try to avoid pneumonia.  Denies any fevers but has had an increased cough.  Denies any other symptoms or concerns.  Admit to some chest tightness.   Shortness of Breath Associated symptoms: chest pain and cough   Associated symptoms: no abdominal pain, no ear pain, no fever, no rash, no sore throat and no vomiting        Prior to Admission medications  Medication Sig Start Date End Date Taking? Authorizing Provider  acetaminophen  (TYLENOL ) 500 MG tablet Take 1,000 mg by mouth every 6 (six) hours as needed for moderate pain.    [provider]  albuterol  (VENTOLIN  HFA) 108 (90 Base) MCG/ACT inhaler INHALE 2 PUFFS BY MOUTH EVERY 6 HOURS AS NEEDED FOR WHEEZING FOR SHORTNESS OF BREATH 06/08/24   Jolinda Potter M, DO  alendronate  (FOSAMAX ) 70 MG tablet TAKE 1 TABLET EVERY 7 DAYS WITH A FULL GLASS OF WATER ON AN EMPTY STOMACH 01/17/24   Jolinda Potter M, DO  ALPRAZolam  (XANAX ) 0.5 MG tablet Take 1 tablet by mouth 4 times daily 11/06/24   Mozingo, Regina Nattalie, NP  amoxicillin -clavulanate (AUGMENTIN ) 875-125 MG tablet Take 1 tablet by mouth 2 (two) times daily. 10/06/24   Lavell Lye A, FNP  aspirin  EC 81 MG tablet Take 81 mg by mouth daily.    [provider]  atorvastatin  (LIPITOR) 40 MG tablet Do not restart until ok by primary care provider  04/21/24   Tat, Alm, MD  azithromycin  (ZITHROMAX ) 250 MG tablet Take 1 tablet (250 mg total) by mouth every Monday, Wednesday, and Friday. 10/19/24   Hope Almarie ORN, NP  budeson-glycopyrrolate-formoterol  (BREZTRI  AEROSPHERE) 160-9-4.8 MCG/ACT AERO Inhale 2 puffs into the lungs 2 (two) times daily. 02/11/24   Jolinda Potter HERO, DO  cetirizine  (ZYRTEC ) 10 MG tablet Take 10 mg by mouth daily. 11/20/23   [provider]  Cholecalciferol (VITAMIN D3 MAXIMUM STRENGTH) 125 MCG (5000 UT) capsule Take 5,000 Units by mouth daily.    [provider]  DULoxetine  (CYMBALTA ) 60 MG capsule Take 2 capsules (120 mg total) by mouth daily. 01/17/24   Jolinda Potter HERO, DO  ipratropium-albuterol  (DUONEB) 0.5-2.5 (3) MG/3ML SOLN INHALE THE CONTENTS OF 1 VIAL VIA NEBULIZER EVERY 6 HOURS AS NEEDED 10/08/24   Mannam, Praveen, MD  midodrine  (PROAMATINE ) 10 MG tablet Take 1 tablet (10 mg total) by mouth 3 (three) times daily with meals. 08/28/24   Jolinda Potter HERO, DO  nitroGLYCERIN  (NITROSTAT ) 0.4 MG SL tablet Place 1 tablet (0.4 mg total) under the tongue every 5 (five) minutes x 3 doses as needed for chest pain (if o relief after 2nd dose, proceed to the ED for an evaluation or call 911). 01/28/23   Miriam Almarie, NP  predniSONE  (DELTASONE ) 10 MG tablet Days 1-4 take 4 tablets (40 mg) daily  Days 5-8 take  3 tablets (30 mg) daily, Days 9-11 take 2 tablets (20 mg) daily, Days 12-14 take 1 tablet (10 mg) daily. 10/06/24   Lavell Lye A, FNP  QUEtiapine  (SEROQUEL ) 200 MG tablet Take 1 tablet (200 mg total) by mouth at bedtime. 07/08/24   Mozingo, Regina Nattalie, NP  Respiratory Therapy Supplies (NEBULIZER/TUBING/MOUTHPIECE) KIT 1 Units by Does not apply route 4 (four) times daily. GIVE face mask and tubing for nebulizer. J44.1 11/21/21   Jolinda Norene HERO, DO  rOPINIRole  (REQUIP ) 3 MG tablet Take 1 tablet (3 mg total) by mouth at bedtime. 01/17/24   Jolinda Norene HERO, DO    Allergies: Doxycycline      Review of Systems  Constitutional:  Negative for chills and fever.  HENT:  Negative for ear pain and sore throat.   Eyes:  Negative for pain and visual disturbance.  Respiratory:  Positive for cough and shortness of breath.   Cardiovascular:  Positive for chest pain. Negative for palpitations.  Gastrointestinal:  Negative for abdominal pain and vomiting.  Genitourinary:  Negative for dysuria and hematuria.  Musculoskeletal:  Negative for arthralgias and back pain.  Skin:  Negative for color change and rash.  Neurological:  Negative for seizures and syncope.  All other systems reviewed and are negative.   Updated Vital Signs BP 119/84 (BP Location: Right Arm)   Pulse (!) 114   Temp 98.3 F (36.8 C) (Oral)   Resp (!) 32   Ht 5' 4 (1.626 m)   Wt 54.6 kg   SpO2 92%   BMI 20.66 kg/m   Physical Exam Vitals and nursing note reviewed.  Constitutional:      General: She is not in acute distress.    Appearance: She is well-developed. She is not ill-appearing.  HENT:     Head: Normocephalic and atraumatic.  Eyes:     Conjunctiva/sclera: Conjunctivae normal.  Cardiovascular:     Rate and Rhythm: Regular rhythm. Tachycardia present.     Heart sounds: No murmur heard. Pulmonary:     Effort: Pulmonary effort is normal. No respiratory distress.     Breath sounds: Wheezing present.  Abdominal:     Palpations: Abdomen is soft.     Tenderness: There is no abdominal tenderness.  Musculoskeletal:        General: No swelling.     Cervical back: Neck supple.  Skin:    General: Skin is warm and dry.     Capillary Refill: Capillary refill takes less than 2 seconds.  Neurological:     General: No focal deficit present.     Mental Status: She is alert.  Psychiatric:        Mood and Affect: Mood normal.     (all labs ordered are listed, but only abnormal results are displayed) Labs Reviewed  BASIC METABOLIC PANEL WITH GFR - Abnormal; Notable for the following components:       Result Value   CO2 34 (*)    Glucose, Bld 103 (*)    All other components within normal limits  CBC - Abnormal; Notable for the following components:   WBC 15.3 (*)    RBC 5.26 (*)    Hemoglobin 16.1 (*)    HCT 48.0 (*)    All other components within normal limits  PRO BRAIN NATRIURETIC PEPTIDE  TROPONIN T, HIGH SENSITIVITY    EKG: None  Radiology: No results found.  {Document cardiac monitor, telemetry assessment procedure when appropriate:32947} Procedures   Medications Ordered in the ED  ipratropium-albuterol  (DUONEB) 0.5-2.5 (3) MG/3ML nebulizer solution 3 mL (has no administration in time range)  methylPREDNISolone  sodium succinate (SOLU-MEDROL ) 125 mg/2 mL injection 125 mg (has no administration in time range)      {Click here for ABCD2, HEART and other calculators REFRESH Note before signing:1}                              Medical Decision Making Amount and/or Complexity of Data Reviewed Labs: ordered. Radiology: ordered.  Risk Prescription drug management.   ***  {Document critical care time when appropriate  Document review of labs and clinical decision tools ie CHADS2VASC2, etc  Document your independent review of radiology images and any outside records  Document your discussion with family members, caretakers and with consultants  Document social determinants of health affecting pt's care  Document your decision making why or why not admission, treatments were needed:32947:::1}   Final diagnoses:  None    ED Discharge Orders     None        "

## 2024-11-25 ENCOUNTER — Encounter: Payer: Self-pay | Admitting: Primary Care

## 2024-11-25 ENCOUNTER — Ambulatory Visit: Admitting: Primary Care

## 2024-11-25 VITALS — BP 116/64 | HR 91 | Temp 97.4°F | Ht 64.0 in | Wt 116.2 lb

## 2024-11-25 DIAGNOSIS — J441 Chronic obstructive pulmonary disease with (acute) exacerbation: Secondary | ICD-10-CM

## 2024-11-25 DIAGNOSIS — F17210 Nicotine dependence, cigarettes, uncomplicated: Secondary | ICD-10-CM

## 2024-11-25 DIAGNOSIS — J9611 Chronic respiratory failure with hypoxia: Secondary | ICD-10-CM | POA: Diagnosis not present

## 2024-11-25 DIAGNOSIS — J439 Emphysema, unspecified: Secondary | ICD-10-CM

## 2024-11-25 DIAGNOSIS — Z79899 Other long term (current) drug therapy: Secondary | ICD-10-CM

## 2024-11-25 MED ORDER — METHYLPREDNISOLONE ACETATE 80 MG/ML IJ SUSP
80.0000 mg | Freq: Once | INTRAMUSCULAR | Status: AC
  Administered 2024-11-25: 80 mg via INTRAMUSCULAR

## 2024-11-25 MED ORDER — CEFDINIR 300 MG PO CAPS: 300.0000 mg | ORAL_CAPSULE | Freq: Two times a day (BID) | ORAL | 0 refills | Status: DC

## 2024-11-25 NOTE — Patient Instructions (Signed)
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 Your recent symptoms suggest an acute exacerbation, or worsening, of your condition. Today, you received a Depo shot and are instructed to start prednisone  tomorrow. You are also prescribed cefdinir  300 mg twice a day for 7 days. Hold off on taking azithromycin  while you are on cefdinir  and restart it after you finish the cefdinir . We will discuss with your pulmonologist the potential addition of medications like Dupixent or Nucala if your exacerbations continue.  -CHRONIC RESPIRATORY FAILURE WITH HYPOXIA: Chronic respiratory failure with hypoxia means your lungs are not getting enough oxygen  into your blood. You should ensure you use continuous flow oxygen  during sleep and use your portable oxygen  concentrator only when you are moving around.  INSTRUCTIONS: Please start taking prednisone  tomorrow as prescribed. Take cefdinir  300 mg twice a day for 7 days and hold off on azithromycin  while on cefdinir . Ensure you use continuous flow oxygen  during sleep and use your portable oxygen  concentrator for ambulation only. We will discuss the potential addition of Dupixent or Nucala with your pulmonologist if your exacerbations persist.  Follow-up 4-6 weeks with Dr. [name] only- discuss starting Dupixent/Nucala for COPD   "

## 2024-11-25 NOTE — Progress Notes (Signed)
 "  @Patient  ID: Alicia Sanchez, female    DOB: 01/15/1961, 64 y.o.   MRN: [id number]  Chief Complaint  Patient presents with   COPD   Medical Management of Chronic Issues    Respiratory failure- uses 2L O2 poc and HS. Pt states she has had some issues breathing and went to the ER last night. Pt would like a steroid shot     Referring provider: Jolinda Norene HERO, DO  HPI: 64 year old female active smoker followed for COPD with emphysema and chronic respiratory failure on oxygen  2 L with activity and at bedtime  Pets:Dog which lives outside the house. No pets, exotic pets. Occupation:Customer sevice rep. previously worked in [employer] Exposures: His current dust exposure and previous exposure to cotton fiber in her line of work Smoking history: 35-pack-year smoking history. Continues to smoke 1 pack per day  Previous [facility] pulmonary encounter: 10/07/2024 Discussed the use of AI scribe software for clinical note transcription with the patient, who gave verbal consent to proceed.  History of Present Illness Alicia Sanchez is a 64 year old female with severe COPD and emphysema who presents with acute shortness of breath.  Acute shortness of breath began three days ago, accompanied by a new onset cough productive of yellow mucus. She experiences pleuritic pain on the right side with coughing and abdominal discomfort described as her stomach 'turning into a knot' when she coughs. No chronic cough or hemoptysis. Pain is present with deep breaths and her chest feels tight.  She is currently on Breztri , taking two puffs twice a day, and uses a nebulizer every four to six hours. Albuterol  is used as a rescue inhaler but is perceived as ineffective. No doses of her maintenance inhaler have been missed. She started Augmentin  and prednisone  last night, with two doses of Augmentin  taken so far. The prednisone  regimen is 40 mg for three days, tapering down over a total of 12 days.  She is an  active smoker and wants to quit, having previously quit in the past. She has a portable oxygen  tank at home and uses it as needed, especially during exertion or at night. She reports that her oxygen  levels are sometimes low and that she has anxiety.  She has a history of low blood pressure, typically around 90/60, and takes midodrine  as needed. She has experienced exacerbations in the past, often resulting in bronchitis with purulent mucus and shortness of breath, requiring hospitalization in June and August.  She recently lost her mother and is still coping with the loss, which has contributed to her anxiety.    11/25/2024- Interim hx  Discussed the use of AI scribe software for clinical note transcription with the patient, who gave verbal consent to proceed.  History of Present Illness Alicia Sanchez is a 64 year old female with severe COPD and emphysema who presents with shortness of breath and chest tightness.  She has a history of severe COPD with emphysema and recurrent exacerbations. Her symptoms of shortness of breath, chest tightness, and increased heart rate have worsened since before the New Year, particularly during a trip [place]. She experiences difficulty breathing even with her oxygen  set at three liters and struggles with basic activities such as going to the bathroom without feeling like she might collapse.  Recently, she was seen in the emergency room where she received a dose of prednisone  and was given a prescription to continue, which she has not yet picked up. She was also taking azithromycin   on a Monday, Wednesday, Friday schedule. Recent lab work showed a white blood cell count of fifteen, up from eight two months ago. Her chest x-ray showed hyperinflation and coarse interstitial markings. BNP and troponin levels were normal.  She uses a nebulizer at home regularly and reports wheezing and coughing up colored mucus, which she describes as yellowish and turning brown. Her  oxygen  levels drop, causing headaches and increased heart rate. She has drastically reduced her smoking but continues to smoke. She uses a portable oxygen  concentrator.  Allergies[1]  Immunization History  Administered Date(s) Administered   Influenza Split 08/25/2016   Influenza, Seasonal, Injecte, Preservative Fre 01/17/2024, 08/28/2024   Influenza,inj,Quad PF,6+ Mos 09/25/2017, 07/16/2018, 10/25/2020, 08/30/2021, 09/24/2022   Influenza-Unspecified 09/25/2017, 07/16/2018, 08/30/2021   Moderna Sars-Covid-2 Vaccination 01/19/2020, 02/16/2020   PNEUMOCOCCAL CONJUGATE-20 01/17/2024   Pneumococcal Polysaccharide-23 05/23/2018   Respiratory Syncytial Virus Vaccine ,Recomb Aduvanted(Arexvy ) 09/24/2022   Tdap 05/19/2007, 07/08/2014, 08/28/2024    Past Medical History:  Diagnosis Date   Anxiety    Aortic atherosclerosis    Chronic kidney disease    COPD (chronic obstructive pulmonary disease) (HCC)    Depression    Dyspnea    Headache    History of chest pain    History of syncope    Hypotension    Osteoporosis    Palpitations    Panic attacks    Pneumonia    Restless legs syndrome (RLS)     Tobacco History: Tobacco Use History[2] Ready to quit: Not Answered Counseling given: Not Answered Tobacco comments: Smoking 4-5 cigarettes.  11/28/2023 hfb   Outpatient Medications Prior to Visit  Medication Sig Dispense Refill   alendronate  (FOSAMAX ) 70 MG tablet TAKE 1 TABLET EVERY 7 DAYS WITH A FULL GLASS OF WATER ON AN EMPTY STOMACH 12 tablet 3   cetirizine  (ZYRTEC ) 10 MG tablet Take 10 mg by mouth daily.     Cholecalciferol (VITAMIN D3 MAXIMUM STRENGTH) 125 MCG (5000 UT) capsule Take 5,000 Units by mouth daily.     nitroGLYCERIN  (NITROSTAT ) 0.4 MG SL tablet Place 1 tablet (0.4 mg total) under the tongue every 5 (five) minutes x 3 doses as needed for chest pain (if o relief after 2nd dose, proceed to the ED for an evaluation or call 911). 25 tablet 2   Respiratory Therapy Supplies  (NEBULIZER/TUBING/MOUTHPIECE) KIT 1 Units by Does not apply route 4 (four) times daily. GIVE face mask and tubing for nebulizer. J44.1 1 kit 0   rOPINIRole  (REQUIP ) 3 MG tablet Take 1 tablet (3 mg total) by mouth at bedtime. 90 tablet 3   acetaminophen  (TYLENOL ) 500 MG tablet Take 1,000 mg by mouth every 6 (six) hours as needed for moderate pain.     albuterol  (VENTOLIN  HFA) 108 (90 Base) MCG/ACT inhaler INHALE 2 PUFFS BY MOUTH EVERY 6 HOURS AS NEEDED FOR WHEEZING FOR SHORTNESS OF BREATH 9 g 2   ALPRAZolam  (XANAX ) 0.5 MG tablet Take 1 tablet by mouth 4 times daily 72 tablet 0   aspirin  EC 81 MG tablet Take 81 mg by mouth daily.     atorvastatin  (LIPITOR) 40 MG tablet Do not restart until ok by primary care provider 90 tablet 3   azithromycin  (ZITHROMAX ) 250 MG tablet Take 1 tablet (250 mg total) by mouth every Monday, Wednesday, and Friday. 12 tablet 3   budeson-glycopyrrolate -formoterol  (BREZTRI  AEROSPHERE) 160-9-4.8 MCG/ACT AERO Inhale 2 puffs into the lungs 2 (two) times daily. 32.1 g 5   DULoxetine  (CYMBALTA ) 60 MG capsule Take 2 capsules (120  mg total) by mouth daily. 180 capsule 3   ipratropium-albuterol  (DUONEB) 0.5-2.5 (3) MG/3ML SOLN INHALE THE CONTENTS OF 1 VIAL VIA NEBULIZER EVERY 6 HOURS AS NEEDED 360 mL 3   midodrine  (PROAMATINE ) 10 MG tablet Take 1 tablet (10 mg total) by mouth 3 (three) times daily with meals. 90 tablet 0   midodrine  (PROAMATINE ) 2.5 MG tablet Take 2.5 mg by mouth 3 (three) times daily.     predniSONE  (DELTASONE ) 10 MG tablet Take 4 tablets (40 mg total) by mouth daily for 4 days. 16 tablet 0   QUEtiapine  (SEROQUEL ) 200 MG tablet Take 1 tablet (200 mg total) by mouth at bedtime. 90 tablet 1   amoxicillin -clavulanate (AUGMENTIN ) 875-125 MG tablet Take 1 tablet by mouth 2 (two) times daily. (Patient not taking: Reported on 11/25/2024) 14 tablet 0   predniSONE  (DELTASONE ) 10 MG tablet Days 1-4 take 4 tablets (40 mg) daily  Days 5-8 take 3 tablets (30 mg) daily, Days 9-11  take 2 tablets (20 mg) daily, Days 12-14 take 1 tablet (10 mg) daily. (Patient not taking: Reported on 11/25/2024) 37 tablet 0   No facility-administered medications prior to visit.   Review of Systems  Review of Systems  Respiratory:  Positive for cough, shortness of breath and wheezing.    Physical Exam  BP 116/64   Pulse 91   Temp (!) 97.4 F (36.3 C)   Ht 5' 4 (1.626 m) Comment: pt stated  Wt 116 lb 3.2 oz (52.7 kg)   SpO2 94% Comment: 2L O2 CONT  BMI 19.95 kg/m  Physical Exam Constitutional:      Appearance: Normal appearance. She is well-developed.  HENT:     Head: Normocephalic and atraumatic.     Mouth/Throat:     Mouth: Mucous membranes are moist.     Pharynx: Oropharynx is clear.  Eyes:     Pupils: Pupils are equal, round, and reactive to light.  Cardiovascular:     Rate and Rhythm: Normal rate and regular rhythm.     Heart sounds: Normal heart sounds. No murmur heard. Pulmonary:     Effort: Pulmonary effort is normal. No respiratory distress.     Breath sounds: Wheezing present. No rhonchi.  Musculoskeletal:        General: Normal range of motion.     Cervical back: Normal range of motion and neck supple.  Skin:    General: Skin is warm and dry.     Findings: No erythema or rash.  Neurological:     General: No focal deficit present.     Mental Status: She is alert and oriented to person, place, and time. Mental status is at baseline.  Psychiatric:        Mood and Affect: Mood normal.        Behavior: Behavior normal.        Thought Content: Thought content normal.        Judgment: Judgment normal.      Lab Results:  CBC    Component Value Date/Time   WBC 17.1 (H) 12/09/2024 1536   WBC 8.6 11/29/2024 0541   RBC 4.54 12/09/2024 1536   RBC 4.07 11/29/2024 0541   HGB 14.4 12/09/2024 1536   HCT 42.7 12/09/2024 1536   PLT 396 12/09/2024 1536   MCV 94 12/09/2024 1536   MCH 31.7 12/09/2024 1536   MCH 30.5 11/29/2024 0541   MCHC 33.7 12/09/2024  1536   MCHC 33.2 11/29/2024 0541   RDW 12.3 12/09/2024 1536  LYMPHSABS 1.0 12/09/2024 1536   MONOABS 1.4 (H) 06/22/2024 1941   EOSABS 0.0 12/09/2024 1536   BASOSABS 0.0 12/09/2024 1536    BMET    Component Value Date/Time   NA 142 12/09/2024 1536   K 4.9 12/09/2024 1536   CL 101 12/09/2024 1536   CO2 26 12/09/2024 1536   GLUCOSE 99 12/09/2024 1536   GLUCOSE 109 (H) 11/29/2024 0541   BUN 10 12/09/2024 1536   CREATININE 0.76 12/09/2024 1536   CALCIUM  9.6 12/09/2024 1536   GFRNONAA >60 11/29/2024 0541   GFRAA 91 06/03/2020 1505    BNP No results found for: BNP  ProBNP    Component Value Date/Time   PROBNP 88.3 11/28/2024 1656    Imaging: DG Chest 2 View Result Date: 12/09/2024 CLINICAL DATA:  COPD exacerbation. EXAM: CHEST - 2 VIEW COMPARISON:  11/28/2024 FINDINGS: Lungs are adequately inflated and otherwise clear. Cardiomediastinal silhouette and remainder of the exam is unchanged. IMPRESSION: No active cardiopulmonary disease. Electronically Signed   By: Toribio Agreste M.D.   On: 12/09/2024 15:19   CT Angio Chest PE W/Cm &/Or Wo Cm Result Date: 11/28/2024 CLINICAL DATA:  Shortness of breath and headache. EXAM: CT ANGIOGRAPHY CHEST WITH CONTRAST TECHNIQUE: Multidetector CT imaging of the chest was performed using the standard protocol during bolus administration of intravenous contrast. Multiplanar CT image reconstructions and MIPs were obtained to evaluate the vascular anatomy. RADIATION DOSE REDUCTION: This exam was performed according to the departmental dose-optimization program which includes automated exposure control, adjustment of the mA and/or kV according to patient size and/or use of iterative reconstruction technique. CONTRAST:  75mL OMNIPAQUE  IOHEXOL  350 MG/ML SOLN COMPARISON:  None Available. FINDINGS: Cardiovascular: Satisfactory opacification of the pulmonary arteries to the segmental level. No evidence of pulmonary embolism. Normal heart size. No pericardial  effusion. Mediastinum/Nodes: No enlarged mediastinal, hilar, or axillary lymph nodes. Thyroid  gland, trachea, and esophagus demonstrate no significant findings. Lungs/Pleura: There is extensive emphysematous lung disease with a 3 mm calcified pulmonary nodule seen within the posterolateral aspect of the left upper lobe (axial CT image 26, CT series 11). Additional punctate calcified lung nodule is seen within the anterior aspect of the left upper lobe (axial CT image 42, CT series 11). No acute infiltrate, pleural effusion or pneumothorax is identified Upper Abdomen: No acute abnormality. Musculoskeletal: No chest wall abnormality. No acute or significant osseous findings. Review of the MIP images confirms the above findings. IMPRESSION: 1. No evidence of pulmonary embolism or other acute intrathoracic process. 2. Extensive emphysematous lung disease. 3. Evidence of prior granulomatous disease. Electronically Signed   By: Suzen Dials M.D.   On: 11/28/2024 18:08   CT Head Wo Contrast Result Date: 11/28/2024 EXAM: CT HEAD WITHOUT CONTRAST 11/28/2024 05:56:05 PM TECHNIQUE: CT of the head was performed without the administration of intravenous contrast. Automated exposure control, iterative reconstruction, and/or weight based adjustment of the mA/kV was utilized to reduce the radiation dose to as low as reasonably achievable. COMPARISON: CT head 11/20/2015. CLINICAL HISTORY: Headache, increasing frequency or severity. FINDINGS: BRAIN AND VENTRICLES: There is no evidence of an acute infarct, intracranial hemorrhage, mass, midline shift, hydrocephalus, or extra-axial fluid collection. Cerebral volume is within normal limits for age. Calcified atherosclerosis at the skull base. ORBITS: No acute abnormality. SINUSES: No acute abnormality. SOFT TISSUES AND SKULL: No acute soft tissue abnormality. No skull fracture. IMPRESSION: 1. Negative head CT. Electronically signed by: Dasie Hamburg MD MD 11/28/2024 06:00 PM EST  RP Workstation: [id number]   DG  Chest Portable 1 View Result Date: 11/28/2024 CLINICAL DATA:  Short of breath, COPD EXAM: PORTABLE CHEST 1 VIEW COMPARISON:  11/24/2024 FINDINGS: Single frontal view of the chest demonstrates an unremarkable cardiac silhouette. Background emphysema without acute airspace disease, effusion, or pneumothorax. No acute bony abnormalities. IMPRESSION: 1. Stable emphysema.  No acute airspace disease. Electronically Signed   By: Ozell Daring M.D.   On: 11/28/2024 17:03   DG Chest 1 View Result Date: 11/24/2024 EXAM: 1 VIEW(S) XRAY OF THE CHEST 11/24/2024 10:36:00 PM COMPARISON: 10/06/2024 CLINICAL HISTORY: sob FINDINGS: LUNGS AND PLEURA: Hyperinflation with coarsened interstitial markings compatible with emphysema. No pleural effusion. No pneumothorax. HEART AND MEDIASTINUM: No acute abnormality of the cardiac and mediastinal silhouettes. BONES AND SOFT TISSUES: Old healed left rib fracture. IMPRESSION: 1. No acute findings. 2. Hyperinflation with coarsened interstitial markings compatible with emphysema. Electronically signed by: Greig Pique MD 11/24/2024 10:41 PM EST RP Workstation: [id number]     Assessment & Plan:   1. Chronic obstructive pulmonary disease with emphysema, unspecified emphysema type (HCC) (Primary) - methylPREDNISolone  acetate (DEPO-MEDROL ) injection 80 mg  2. Chronic respiratory failure with hypoxia (HCC)  3. Medication management   Assessment and Plan Assessment & Plan Chronic obstructive pulmonary disease with emphysema, acute exacerbation Severe COPD with emphysema and recurrent exacerbations. Recent acute exacerbation with symptoms of dyspnea, chest tightness, and tachycardia. Chest x-ray showed hyperinflation and coarse interstitial markings compatible with emphysema. Elevated white blood cell count suggests possible infection. Azithromycin  was previously prescribed but did not prevent hospitalization. Current symptoms include wheezing and  productive cough with yellowish to brown sputum. EKG from ER showed QT prolongation, but azithromycin  can be continued with caution. - Administered Depo shot today. - Start prednisone  tomorrow. - Prescribed cefdinir  300 mg twice a day for 7 days. - Hold azithromycin  while on cefdinir  and restart after completion. - Discuss potential addition of Dupixent or Nucala with pulmonologist if exacerbations persist.  Chronic respiratory failure with hypoxia Chronic respiratory failure with hypoxia, managed with supplemental oxygen . Oxygen  saturation drops below 88% cause headaches and increased heart rate. Portable oxygen  concentrator used for ambulation, but continuous flow recommended during sleep. - Ensure continuous flow oxygen  during sleep. - Use portable oxygen  concentrator for ambulation only.  Medication management for pulmonary disease Current medication regimen includes azithromycin  for chronic bronchitis. Recent exacerbation required ER visit and steroid treatment. Discussion of potential future medications to prevent exacerbations, including Dupixent or Nucala. Side effects may include GI symptoms and headaches, but weight gain is not a common side effect. - Continue azithromycin  with caution, holding during cefdinir  treatment. - Discuss potential addition of Dupixent or Nucala with pulmonologist if exacerbations persist.   Almarie LELON Ferrari, NP 12/13/2024     [1]  Allergies Allergen Reactions   Doxycycline  Other (See Comments)    Chest pain  [2]  Social History Tobacco Use  Smoking Status Every Day   Current packs/day: 0.50   Average packs/day: 0.5 packs/day for 42.0 years (21.0 ttl pk-yrs)   Types: Cigarettes   Passive exposure: Past  Smokeless Tobacco Never  Tobacco Comments   Smoking 4-5 cigarettes.  11/28/2023 hfb   "

## 2024-11-26 ENCOUNTER — Other Ambulatory Visit: Payer: Self-pay | Admitting: Adult Health

## 2024-11-26 DIAGNOSIS — F411 Generalized anxiety disorder: Secondary | ICD-10-CM

## 2024-11-26 DIAGNOSIS — F410 Panic disorder [episodic paroxysmal anxiety] without agoraphobia: Secondary | ICD-10-CM

## 2024-11-28 ENCOUNTER — Other Ambulatory Visit: Payer: Self-pay

## 2024-11-28 ENCOUNTER — Encounter (HOSPITAL_COMMUNITY): Payer: Self-pay

## 2024-11-28 ENCOUNTER — Inpatient Hospital Stay (HOSPITAL_COMMUNITY)
Admission: EM | Admit: 2024-11-28 | Discharge: 2024-11-30 | DRG: 189 | Disposition: A | Discharge status: Home / Self Care | Attending: Family Medicine | Admitting: Family Medicine

## 2024-11-28 ENCOUNTER — Emergency Department (HOSPITAL_COMMUNITY)

## 2024-11-28 DIAGNOSIS — J432 Centrilobular emphysema: Secondary | ICD-10-CM

## 2024-11-28 DIAGNOSIS — J439 Emphysema, unspecified: Secondary | ICD-10-CM | POA: Diagnosis present

## 2024-11-28 DIAGNOSIS — F411 Generalized anxiety disorder: Secondary | ICD-10-CM | POA: Diagnosis present

## 2024-11-28 DIAGNOSIS — R0602 Shortness of breath: Secondary | ICD-10-CM | POA: Diagnosis present

## 2024-11-28 DIAGNOSIS — Z7983 Long term (current) use of bisphosphonates: Secondary | ICD-10-CM

## 2024-11-28 DIAGNOSIS — Z7951 Long term (current) use of inhaled steroids: Secondary | ICD-10-CM | POA: Diagnosis not present

## 2024-11-28 DIAGNOSIS — I959 Hypotension, unspecified: Secondary | ICD-10-CM | POA: Diagnosis present

## 2024-11-28 DIAGNOSIS — E782 Mixed hyperlipidemia: Secondary | ICD-10-CM | POA: Diagnosis present

## 2024-11-28 DIAGNOSIS — G2581 Restless legs syndrome: Secondary | ICD-10-CM | POA: Diagnosis present

## 2024-11-28 DIAGNOSIS — J9601 Acute respiratory failure with hypoxia: Secondary | ICD-10-CM

## 2024-11-28 DIAGNOSIS — Z8419 Family history of other disorders of kidney and ureter: Secondary | ICD-10-CM | POA: Diagnosis not present

## 2024-11-28 DIAGNOSIS — Z825 Family history of asthma and other chronic lower respiratory diseases: Secondary | ICD-10-CM

## 2024-11-28 DIAGNOSIS — J9621 Acute and chronic respiratory failure with hypoxia: Secondary | ICD-10-CM | POA: Diagnosis present

## 2024-11-28 DIAGNOSIS — Z833 Family history of diabetes mellitus: Secondary | ICD-10-CM | POA: Diagnosis not present

## 2024-11-28 DIAGNOSIS — F32A Depression, unspecified: Secondary | ICD-10-CM | POA: Diagnosis present

## 2024-11-28 DIAGNOSIS — E872 Acidosis, unspecified: Secondary | ICD-10-CM | POA: Diagnosis present

## 2024-11-28 DIAGNOSIS — Z90710 Acquired absence of both cervix and uterus: Secondary | ICD-10-CM

## 2024-11-28 DIAGNOSIS — Z7982 Long term (current) use of aspirin: Secondary | ICD-10-CM | POA: Diagnosis not present

## 2024-11-28 DIAGNOSIS — F17210 Nicotine dependence, cigarettes, uncomplicated: Secondary | ICD-10-CM | POA: Diagnosis present

## 2024-11-28 DIAGNOSIS — F4310 Post-traumatic stress disorder, unspecified: Secondary | ICD-10-CM

## 2024-11-28 DIAGNOSIS — Z881 Allergy status to other antibiotic agents status: Secondary | ICD-10-CM

## 2024-11-28 DIAGNOSIS — F331 Major depressive disorder, recurrent, moderate: Secondary | ICD-10-CM

## 2024-11-28 DIAGNOSIS — Z1152 Encounter for screening for COVID-19: Secondary | ICD-10-CM

## 2024-11-28 DIAGNOSIS — Z79899 Other long term (current) drug therapy: Secondary | ICD-10-CM

## 2024-11-28 DIAGNOSIS — J441 Chronic obstructive pulmonary disease with (acute) exacerbation: Secondary | ICD-10-CM | POA: Diagnosis present

## 2024-11-28 DIAGNOSIS — M810 Age-related osteoporosis without current pathological fracture: Secondary | ICD-10-CM | POA: Diagnosis present

## 2024-11-28 HISTORY — DX: Dyspnea, unspecified: R06.00

## 2024-11-28 LAB — BLOOD GAS, VENOUS
Acid-Base Excess: 5 mmol/L — ABNORMAL HIGH (ref 0.0–2.0)
Bicarbonate: 31 mmol/L — ABNORMAL HIGH (ref 20.0–28.0)
Drawn by: 3409
O2 Saturation: 83.7 %
Patient temperature: 37.1
pCO2, Ven: 50 mmHg (ref 44–60)
pH, Ven: 7.4 (ref 7.25–7.43)
pO2, Ven: 48 mmHg — ABNORMAL HIGH (ref 32–45)

## 2024-11-28 LAB — BASIC METABOLIC PANEL WITH GFR
Anion gap: 16 — ABNORMAL HIGH (ref 5–15)
BUN: 13 mg/dL (ref 8–23)
CO2: 23 mmol/L (ref 22–32)
Calcium: 9.4 mg/dL (ref 8.9–10.3)
Chloride: 102 mmol/L (ref 98–111)
Creatinine, Ser: 0.8 mg/dL (ref 0.44–1.00)
GFR, Estimated: >60 mL/min
Glucose, Bld: 134 mg/dL — ABNORMAL HIGH (ref 70–99)
Potassium: 4 mmol/L (ref 3.5–5.1)
Sodium: 141 mmol/L (ref 135–145)

## 2024-11-28 LAB — HEPATIC FUNCTION PANEL
ALT: 18 U/L (ref 0–44)
AST: 23 U/L (ref 15–41)
Albumin: 4.4 g/dL (ref 3.5–5.0)
Alkaline Phosphatase: 65 U/L (ref 38–126)
Bilirubin, Direct: 0.1 mg/dL (ref 0.0–0.2)
Indirect Bilirubin: 0.1 mg/dL — ABNORMAL LOW (ref 0.3–0.9)
Total Bilirubin: 0.3 mg/dL (ref 0.0–1.2)
Total Protein: 7.2 g/dL (ref 6.5–8.1)

## 2024-11-28 LAB — LACTIC ACID, PLASMA
Lactic Acid, Venous: 3.2 mmol/L (ref 0.5–1.9)
Lactic Acid, Venous: 2.3 mmol/L (ref 0.5–1.9)

## 2024-11-28 LAB — CBC
HCT: 46.9 % — ABNORMAL HIGH (ref 36.0–46.0)
Hemoglobin: 15.7 g/dL — ABNORMAL HIGH (ref 12.0–15.0)
MCH: 30.6 pg (ref 26.0–34.0)
MCHC: 33.5 g/dL (ref 30.0–36.0)
MCV: 91.4 fL (ref 80.0–100.0)
Platelets: 320 K/uL (ref 150–400)
RBC: 5.13 MIL/uL — ABNORMAL HIGH (ref 3.87–5.11)
RDW: 12.3 % (ref 11.5–15.5)
WBC: 9.9 K/uL (ref 4.0–10.5)
nRBC: 0 % (ref 0.0–0.2)

## 2024-11-28 LAB — RESP PANEL BY RT-PCR (RSV, FLU A&B, COVID)  RVPGX2
Influenza A by PCR: NEGATIVE
Influenza B by PCR: NEGATIVE
Resp Syncytial Virus by PCR: NEGATIVE
SARS Coronavirus 2 by RT PCR: NEGATIVE

## 2024-11-28 LAB — TROPONIN T, HIGH SENSITIVITY
Troponin T High Sensitivity: <15 ng/L (ref 0–19)
Troponin T High Sensitivity: <15 ng/L (ref 0–19)

## 2024-11-28 LAB — PRO BRAIN NATRIURETIC PEPTIDE: Pro Brain Natriuretic Peptide: 88.3 pg/mL

## 2024-11-28 MED ORDER — AZITHROMYCIN 250 MG PO TABS
500.0000 mg | ORAL_TABLET | Freq: Once | ORAL | Status: AC
  Administered 2024-11-28: 500 mg via ORAL
  Filled 2024-11-28: qty 2

## 2024-11-28 MED ORDER — LACTATED RINGERS IV SOLN: INTRAVENOUS | Status: DC

## 2024-11-28 MED ORDER — SODIUM CHLORIDE 0.9 % IV SOLN
1.0000 g | INTRAVENOUS | Status: DC
  Filled 2024-11-28: qty 10

## 2024-11-28 MED ORDER — IPRATROPIUM BROMIDE 0.02 % IN SOLN
0.5000 mg | Freq: Four times a day (QID) | RESPIRATORY_TRACT | Status: DC
  Administered 2024-11-28 – 2024-11-29 (×4): 0.5 mg via RESPIRATORY_TRACT
  Filled 2024-11-28 (×3): qty 2.5

## 2024-11-28 MED ORDER — IOHEXOL 350 MG/ML SOLN
75.0000 mL | Freq: Once | INTRAVENOUS | Status: AC | PRN
  Administered 2024-11-28: 75 mL via INTRAVENOUS

## 2024-11-28 MED ORDER — ATORVASTATIN CALCIUM 40 MG PO TABS
40.0000 mg | ORAL_TABLET | Freq: Every day | ORAL | Status: DC
  Administered 2024-11-28 – 2024-11-29 (×2): 40 mg via ORAL
  Filled 2024-11-28 (×2): qty 1

## 2024-11-28 MED ORDER — BUDESON-GLYCOPYRROL-FORMOTEROL 160-9-4.8 MCG/ACT IN AERO
2.0000 | INHALATION_SPRAY | Freq: Two times a day (BID) | RESPIRATORY_TRACT | Status: DC
  Administered 2024-11-28 – 2024-11-30 (×4): 2 via RESPIRATORY_TRACT
  Filled 2024-11-28: qty 5.9

## 2024-11-28 MED ORDER — MIDODRINE HCL 5 MG PO TABS
10.0000 mg | ORAL_TABLET | Freq: Three times a day (TID) | ORAL | Status: DC
  Administered 2024-11-29 – 2024-11-30 (×4): 10 mg via ORAL
  Filled 2024-11-28 (×4): qty 2

## 2024-11-28 MED ORDER — DULOXETINE HCL 60 MG PO CPEP
120.0000 mg | ORAL_CAPSULE | Freq: Every day | ORAL | Status: DC
  Administered 2024-11-29 – 2024-11-30 (×2): 120 mg via ORAL
  Filled 2024-11-28 (×2): qty 2

## 2024-11-28 MED ORDER — GUAIFENESIN ER 600 MG PO TB12
600.0000 mg | ORAL_TABLET | Freq: Two times a day (BID) | ORAL | Status: DC
  Administered 2024-11-28 – 2024-11-30 (×4): 600 mg via ORAL
  Filled 2024-11-28 (×3): qty 1

## 2024-11-28 MED ORDER — ONDANSETRON HCL 4 MG/2ML IJ SOLN: 4.0000 mg | Freq: Four times a day (QID) | INTRAMUSCULAR | Status: DC | PRN

## 2024-11-28 MED ORDER — KETOROLAC TROMETHAMINE 15 MG/ML IJ SOLN
15.0000 mg | Freq: Once | INTRAMUSCULAR | Status: AC
  Administered 2024-11-28: 15 mg via INTRAVENOUS
  Filled 2024-11-28: qty 1

## 2024-11-28 MED ORDER — SODIUM CHLORIDE 0.9 % IV SOLN
1.0000 g | Freq: Once | INTRAVENOUS | Status: AC
  Administered 2024-11-28: 1 g via INTRAVENOUS
  Filled 2024-11-28: qty 10

## 2024-11-28 MED ORDER — QUETIAPINE FUMARATE 100 MG PO TABS
200.0000 mg | ORAL_TABLET | Freq: Every day | ORAL | Status: DC
  Administered 2024-11-28 – 2024-11-29 (×2): 200 mg via ORAL
  Filled 2024-11-28 (×2): qty 2

## 2024-11-28 MED ORDER — ENOXAPARIN SODIUM 40 MG/0.4ML IJ SOSY
40.0000 mg | PREFILLED_SYRINGE | Freq: Every day | INTRAMUSCULAR | Status: DC
  Administered 2024-11-28 – 2024-11-29 (×2): 40 mg via SUBCUTANEOUS
  Filled 2024-11-28 (×2): qty 0.4

## 2024-11-28 MED ORDER — PREDNISONE 20 MG PO TABS: 40.0000 mg | ORAL_TABLET | Freq: Every day | ORAL | Status: DC

## 2024-11-28 MED ORDER — ROPINIROLE HCL 1 MG PO TABS
3.0000 mg | ORAL_TABLET | Freq: Every day | ORAL | Status: DC
  Administered 2024-11-28 – 2024-11-29 (×2): 3 mg via ORAL
  Filled 2024-11-28 (×2): qty 3

## 2024-11-28 MED ORDER — ONDANSETRON HCL 4 MG PO TABS: 4.0000 mg | ORAL_TABLET | Freq: Four times a day (QID) | ORAL | Status: DC | PRN

## 2024-11-28 MED ORDER — SODIUM CHLORIDE 0.9 % IV BOLUS
1000.0000 mL | Freq: Once | INTRAVENOUS | Status: AC
  Administered 2024-11-28: 1000 mL via INTRAVENOUS

## 2024-11-28 MED ORDER — ASPIRIN 81 MG PO TBEC
81.0000 mg | DELAYED_RELEASE_TABLET | Freq: Every day | ORAL | Status: DC
  Administered 2024-11-29 – 2024-11-30 (×2): 81 mg via ORAL
  Filled 2024-11-28 (×2): qty 1

## 2024-11-28 MED ORDER — ALBUTEROL SULFATE (2.5 MG/3ML) 0.083% IN NEBU
10.0000 mg/h | INHALATION_SOLUTION | Freq: Once | RESPIRATORY_TRACT | Status: AC
  Administered 2024-11-28: 10 mg/h via RESPIRATORY_TRACT
  Filled 2024-11-28: qty 3

## 2024-11-28 MED ORDER — METHYLPREDNISOLONE SODIUM SUCC 125 MG IJ SOLR
125.0000 mg | Freq: Once | INTRAMUSCULAR | Status: AC
  Administered 2024-11-28: 125 mg via INTRAVENOUS
  Filled 2024-11-28: qty 2

## 2024-11-28 MED ORDER — ALPRAZOLAM 0.5 MG PO TABS
0.5000 mg | ORAL_TABLET | Freq: Four times a day (QID) | ORAL | Status: DC
  Administered 2024-11-28 – 2024-11-30 (×6): 0.5 mg via ORAL
  Filled 2024-11-28 (×6): qty 1

## 2024-11-28 MED ORDER — METHYLPREDNISOLONE SODIUM SUCC 125 MG IJ SOLR
60.0000 mg | Freq: Two times a day (BID) | INTRAMUSCULAR | Status: AC
  Administered 2024-11-29 (×2): 60 mg via INTRAVENOUS
  Filled 2024-11-28 (×2): qty 2

## 2024-11-28 NOTE — Plan of Care (Signed)
  Problem: Education: Goal: Knowledge of General Education information will improve Description: Including pain rating scale, medication(s)/side effects and non-pharmacologic comfort measures Outcome: Progressing   Problem: Health Behavior/Discharge Planning: Goal: Ability to manage health-related needs will improve Outcome: Progressing   Problem: Clinical Measurements: Goal: Ability to maintain clinical measurements within normal limits will improve Outcome: Progressing Goal: Will remain free from infection Outcome: Progressing Goal: Diagnostic test results will improve Outcome: Progressing Goal: Respiratory complications will improve Outcome: Progressing Goal: Cardiovascular complication will be avoided Outcome: Progressing   Problem: Activity: Goal: Risk for activity intolerance will decrease Outcome: Progressing   Problem: Nutrition: Goal: Adequate nutrition will be maintained Outcome: Progressing   Problem: Coping: Goal: Level of anxiety will decrease Outcome: Progressing   Problem: Elimination: Goal: Will not experience complications related to bowel motility Outcome: Progressing Goal: Will not experience complications related to urinary retention Outcome: Progressing   Problem: Pain Managment: Goal: General experience of comfort will improve and/or be controlled Outcome: Progressing   Problem: Safety: Goal: Ability to remain free from injury will improve Outcome: Progressing   Problem: Skin Integrity: Goal: Risk for impaired skin integrity will decrease Outcome: Progressing   Problem: Education: Goal: Knowledge of disease or condition will improve Outcome: Progressing Goal: Knowledge of the prescribed therapeutic regimen will improve Outcome: Progressing Goal: Individualized Educational Video(s) Outcome: Progressing   Problem: Activity: Goal: Ability to tolerate increased activity will improve Outcome: Progressing Goal: Will verbalize the  importance of balancing activity with adequate rest periods Outcome: Progressing   Problem: Respiratory: Goal: Ability to maintain a clear airway will improve Outcome: Progressing Goal: Levels of oxygenation will improve Outcome: Progressing Goal: Ability to maintain adequate ventilation will improve Outcome: Progressing   Problem: Education: Goal: Knowledge of General Education information will improve Description: Including pain rating scale, medication(s)/side effects and non-pharmacologic comfort measures Outcome: Progressing   Problem: Health Behavior/Discharge Planning: Goal: Ability to manage health-related needs will improve Outcome: Progressing   Problem: Clinical Measurements: Goal: Ability to maintain clinical measurements within normal limits will improve Outcome: Progressing Goal: Will remain free from infection Outcome: Progressing Goal: Diagnostic test results will improve Outcome: Progressing Goal: Respiratory complications will improve Outcome: Progressing Goal: Cardiovascular complication will be avoided Outcome: Progressing   Problem: Activity: Goal: Risk for activity intolerance will decrease Outcome: Progressing   Problem: Nutrition: Goal: Adequate nutrition will be maintained Outcome: Progressing   Problem: Coping: Goal: Level of anxiety will decrease Outcome: Progressing   Problem: Elimination: Goal: Will not experience complications related to bowel motility Outcome: Progressing Goal: Will not experience complications related to urinary retention Outcome: Progressing   Problem: Pain Managment: Goal: General experience of comfort will improve and/or be controlled Outcome: Progressing   Problem: Safety: Goal: Ability to remain free from injury will improve Outcome: Progressing   Problem: Skin Integrity: Goal: Risk for impaired skin integrity will decrease Outcome: Progressing   Problem: Education: Goal: Knowledge of disease or  condition will improve Outcome: Progressing Goal: Knowledge of the prescribed therapeutic regimen will improve Outcome: Progressing Goal: Individualized Educational Video(s) Outcome: Progressing   Problem: Activity: Goal: Ability to tolerate increased activity will improve Outcome: Progressing Goal: Will verbalize the importance of balancing activity with adequate rest periods Outcome: Progressing   Problem: Respiratory: Goal: Ability to maintain a clear airway will improve Outcome: Progressing Goal: Levels of oxygenation will improve Outcome: Progressing Goal: Ability to maintain adequate ventilation will improve Outcome: Progressing

## 2024-11-28 NOTE — ED Provider Notes (Signed)
 " [facility] EM[facility] AT [facility] Provider Note   CSN: [id number] Arrival date & time: 11/28/24  1526     Patient presents with: Shortness of Sanchez and Headache   Alicia Sanchez is a 64 y.o. female.   Patient is a 64 year old female who presents to the emergency department with a chief complaint of worsening shortness of Sanchez, Alicia Sanchez, Alicia Sanchez in the emergency department on 1/6 secondary to similar symptoms and at that time was discharged home on oral prednisone  which she notes she has been compliant with.  Patient notes that she is normally on 2 L nasal cannula but has had to bump this up at home.  She denies any fever or chills.  She has had no direct chest pain.  She denies any abdominal pain, nausea, vomiting, diarrhea.  She has had no edema to lower extremities.   Shortness of Sanchez Associated symptoms: headaches   Headache      Prior to Admission medications  Medication Sig Start Date End Date Taking? Authorizing Provider  acetaminophen  (TYLENOL ) 500 MG tablet Take 1,000 mg by mouth every 6 (six) hours as needed for moderate pain.    [provider]  albuterol  (VENTOLIN  HFA) 108 (90 Base) MCG/ACT inhaler INHALE 2 PUFFS BY MOUTH EVERY 6 HOURS AS NEEDED FOR WHEEZING FOR SHORTNESS OF Sanchez 06/08/24   Jolinda Potter M, DO  alendronate  (FOSAMAX ) 70 MG tablet TAKE 1 TABLET EVERY 7 DAYS WITH A FULL GLASS OF WATER ON AN EMPTY STOMACH 01/17/24   Jolinda Potter M, DO  ALPRAZolam  (XANAX ) 0.5 MG tablet Take 1 tablet by mouth 4 times daily 11/06/24   Mozingo, Regina Nattalie, NP  aspirin  EC 81 MG tablet Take 81 mg by mouth daily.    [provider]  atorvastatin  (LIPITOR) 40 MG tablet Do not restart until ok by primary care provider 04/21/24   Tat, Alm, MD  azithromycin  (ZITHROMAX ) 250 MG tablet Take 1 tablet (250 mg total) by mouth every Monday, Wednesday, and Friday. 10/19/24   Hope Almarie ORN, NP   budeson-glycopyrrolate -formoterol  (BREZTRI  AEROSPHERE) 160-9-4.8 MCG/ACT AERO Inhale 2 puffs into the lungs 2 (two) times daily. 02/11/24   Jolinda Potter HERO, DO  cefdinir  (OMNICEF ) 300 MG capsule Take 1 capsule (300 mg total) by mouth 2 (two) times daily. 11/25/24   Hope Almarie ORN, NP  cetirizine  (ZYRTEC ) 10 MG tablet Take 10 mg by mouth daily. 11/20/23   [provider]  Cholecalciferol (VITAMIN D3 MAXIMUM STRENGTH) 125 MCG (5000 UT) capsule Take 5,000 Units by mouth daily.    [provider]  DULoxetine  (CYMBALTA ) 60 MG capsule Take 2 capsules (120 mg total) by mouth daily. 01/17/24   Jolinda Potter HERO, DO  ipratropium-albuterol  (DUONEB) 0.5-2.5 (3) MG/3ML SOLN INHALE THE CONTENTS OF 1 VIAL VIA NEBULIZER EVERY 6 HOURS AS NEEDED 10/08/24   Mannam, Praveen, MD  midodrine  (PROAMATINE ) 10 MG tablet Take 1 tablet (10 mg total) by mouth 3 (three) times daily with meals. 08/28/24   Jolinda Potter HERO, DO  midodrine  (PROAMATINE ) 2.5 MG tablet Take 2.5 mg by mouth 3 (three) times daily. 11/04/24   [provider]  nitroGLYCERIN  (NITROSTAT ) 0.4 MG SL tablet Place 1 tablet (0.4 mg total) under the tongue every 5 (five) minutes x 3 doses as needed for chest pain (if o relief after 2nd dose, proceed to the ED for an evaluation or call 911). 01/28/23   Miriam Almarie, NP  predniSONE  (DELTASONE ) 10 MG tablet  Days 1-4 take 4 tablets (40 mg) daily  Days 5-8 take 3 tablets (30 mg) daily, Days 9-11 take 2 tablets (20 mg) daily, Days 12-14 take 1 tablet (10 mg) daily. Patient not taking: Reported on 11/25/2024 10/06/24   Lavell Lye A, FNP  predniSONE  (DELTASONE ) 10 MG tablet Take 4 tablets (40 mg total) by mouth daily for 4 days. 11/24/24 11/28/24  Kammerer, Megan L, DO  QUEtiapine  (SEROQUEL ) 200 MG tablet Take 1 tablet (200 mg total) by mouth at bedtime. 07/08/24   Mozingo, Regina Nattalie, NP  Respiratory Therapy Supplies (NEBULIZER/TUBING/MOUTHPIECE) KIT 1 Units by Does not apply route 4  (four) times daily. GIVE face mask and tubing for nebulizer. J44.1 11/21/21   Jolinda Norene HERO, DO  rOPINIRole  (REQUIP ) 3 MG tablet Take 1 tablet (3 mg total) by mouth at bedtime. 01/17/24   Jolinda Norene HERO, DO    Allergies: Doxycycline     Review of Systems  Respiratory:  Positive for shortness of Sanchez.   Neurological:  Positive for headaches.  All other systems reviewed and are negative.   Updated Vital Signs BP (!) 145/88   Pulse (!) 120   Temp 98.7 F (37.1 C)   Resp (!) 25   Ht 5' 4 (1.626 m)   Wt 52.6 kg   SpO2 94%   BMI 19.91 kg/m   Physical Exam Vitals and nursing note reviewed.  Constitutional:      Appearance: Normal appearance.  HENT:     Head: Normocephalic and atraumatic.     Nose: Nose normal.     Mouth/Throat:     Mouth: Mucous membranes are moist.  Eyes:     Extraocular Movements: Extraocular movements intact.     Conjunctiva/sclera: Conjunctivae normal.     Pupils: Pupils are equal, round, and reactive to light.  Cardiovascular:     Rate and Rhythm: Normal rate and regular rhythm.     Pulses: Normal pulses.     Heart sounds: Normal heart sounds.  Pulmonary:     Effort: Pulmonary effort is normal. Tachypnea present.     Sanchez sounds: Wheezing present. No decreased Sanchez sounds, rhonchi or rales.  Chest:     Chest wall: No tenderness.  Abdominal:     General: Abdomen is flat. Bowel sounds are normal.     Palpations: Abdomen is soft.     Tenderness: There is no abdominal tenderness. There is no guarding.  Musculoskeletal:        General: Normal range of motion.     Cervical back: Normal range of motion and neck supple.     Right lower leg: No edema.     Left lower leg: No edema.  Skin:    General: Skin is warm and dry.  Neurological:     General: No focal deficit present.     Mental Status: She is alert and oriented to person, place, and time. Mental status is at baseline.  Psychiatric:        Mood and Affect: Mood normal.         Behavior: Behavior normal.        Thought Content: Thought content normal.        Judgment: Judgment normal.     (all labs ordered are listed, but only abnormal results are displayed) Labs Reviewed  BASIC METABOLIC PANEL WITH GFR - Abnormal; Notable for the following components:      Result Value   Glucose, Bld 134 (*)    Anion gap 16 (*)  All other components within normal limits  CBC - Abnormal; Notable for the following components:   RBC 5.13 (*)    Hemoglobin 15.7 (*)    HCT 46.9 (*)    All other components within normal limits  HEPATIC FUNCTION PANEL - Abnormal; Notable for the following components:   Indirect Bilirubin 0.1 (*)    All other components within normal limits  LACTIC ACID, PLASMA - Abnormal; Notable for the following components:   Lactic Acid, Venous 3.2 (*)    All other components within normal limits  BLOOD GAS, VENOUS - Abnormal; Notable for the following components:   pO2, Ven 48 (*)    Bicarbonate 31.0 (*)    Acid-Base Excess 5.0 (*)    All other components within normal limits  CULTURE, BLOOD (ROUTINE X 2)  CULTURE, BLOOD (ROUTINE X 2)  RESP PANEL BY RT-PCR (RSV, FLU A&B, COVID)  RVPGX2  PRO BRAIN NATRIURETIC PEPTIDE  LACTIC ACID, PLASMA  TROPONIN T, HIGH SENSITIVITY  TROPONIN T, HIGH SENSITIVITY    EKG: None  Radiology: CT Angio Chest PE W/Cm &/Or Wo Cm Result Date: 11/28/2024 CLINICAL DATA:  Shortness of Sanchez and headache. EXAM: CT ANGIOGRAPHY CHEST WITH CONTRAST TECHNIQUE: Multidetector CT imaging of the chest was performed using the standard protocol during bolus administration of intravenous contrast. Multiplanar CT image reconstructions and MIPs were obtained to evaluate the vascular anatomy. RADIATION DOSE REDUCTION: This exam was performed according to the departmental dose-optimization program which includes automated exposure control, adjustment of the mA and/or kV according to patient size and/or use of iterative reconstruction  technique. CONTRAST:  75mL OMNIPAQUE  IOHEXOL  350 MG/ML SOLN COMPARISON:  None Available. FINDINGS: Cardiovascular: Satisfactory opacification of the pulmonary arteries to the segmental level. No evidence of pulmonary embolism. Normal heart size. No pericardial effusion. Mediastinum/Nodes: No enlarged mediastinal, hilar, or axillary lymph nodes. Thyroid  gland, trachea, and esophagus demonstrate no significant findings. Lungs/Pleura: There is extensive emphysematous lung disease with a 3 mm calcified pulmonary nodule seen within the posterolateral aspect of the left upper lobe (axial CT image 26, CT series 11). Additional punctate calcified lung nodule is seen within the anterior aspect of the left upper lobe (axial CT image 42, CT series 11). No acute infiltrate, pleural effusion or pneumothorax is identified Upper Abdomen: No acute abnormality. Musculoskeletal: No chest wall abnormality. No acute or significant osseous findings. Review of the MIP images confirms the above findings. IMPRESSION: 1. No evidence of pulmonary embolism or other acute intrathoracic process. 2. Extensive emphysematous lung disease. 3. Evidence of prior granulomatous disease. Electronically Signed   By: Suzen Dials M.D.   On: 11/28/2024 18:08   CT Head Wo Contrast Result Date: 11/28/2024 EXAM: CT HEAD WITHOUT CONTRAST 11/28/2024 05:56:05 PM TECHNIQUE: CT of the head was performed without the administration of intravenous contrast. Automated exposure control, iterative reconstruction, and/or weight based adjustment of the mA/kV was utilized to reduce the radiation dose to as low as reasonably achievable. COMPARISON: CT head 11/20/2015. CLINICAL HISTORY: Headache, increasing frequency or severity. FINDINGS: BRAIN AND VENTRICLES: There is no evidence of an acute infarct, intracranial hemorrhage, mass, midline shift, hydrocephalus, or extra-axial fluid collection. Cerebral volume is within normal limits for age. Calcified  atherosclerosis at the skull base. ORBITS: No acute abnormality. SINUSES: No acute abnormality. SOFT TISSUES AND SKULL: No acute soft tissue abnormality. No skull fracture. IMPRESSION: 1. Negative head CT. Electronically signed by: Dasie Hamburg MD MD 11/28/2024 06:00 PM EST RP Workstation: [id number]   DG Chest Portable 1  View Result Date: 11/28/2024 CLINICAL DATA:  Short of Sanchez, COPD EXAM: PORTABLE CHEST 1 VIEW COMPARISON:  11/24/2024 FINDINGS: Single frontal view of the chest demonstrates an unremarkable cardiac silhouette. Background emphysema without acute airspace disease, effusion, or pneumothorax. No acute bony abnormalities. IMPRESSION: 1. Stable emphysema.  No acute airspace disease. Electronically Signed   By: Ozell Daring M.D.   On: 11/28/2024 17:03     Procedures   Medications Ordered in the ED  lactated ringers  infusion ( Intravenous New Bag/Given 11/28/24 1803)  albuterol  (PROVENTIL ) (2.5 MG/3ML) 0.083% nebulizer solution (10 mg/hr Nebulization Given 11/28/24 1639)  methylPREDNISolone  sodium succinate (SOLU-MEDROL ) 125 mg/2 mL injection 125 mg (125 mg Intravenous Given 11/28/24 1659)  sodium chloride  0.9 % bolus 1,000 mL (1,000 mLs Intravenous Bolus from Bag 11/28/24 1659)  cefTRIAXone  (ROCEPHIN ) 1 g in sodium chloride  0.9 % 100 mL IVPB (1 g Intravenous New Bag/Given 11/28/24 1705)  azithromycin  (ZITHROMAX ) tablet 500 mg (500 mg Oral Given 11/28/24 1801)  ketorolac  (TORADOL ) 15 MG/ML injection 15 mg (15 mg Intravenous Given 11/28/24 1700)  iohexol  (OMNIPAQUE ) 350 MG/ML injection 75 mL (75 mLs Intravenous Contrast Given 11/28/24 1749)                                    Medical Decision Making Amount and/or Complexity of Data Reviewed Labs: ordered. Radiology: ordered.  Risk Prescription drug management. Decision regarding hospitalization.   This dyspnea, Sanchez, this involves an extensive number of treatment options, and is a complaint that carries with it a high risk of  complications and morbidity.  The differential diagnosis includes COPD exacerbation, pneumonia, acute viral syndrome, CHF, ACS, pulmonary embolus, pericarditis, myocarditis   Co morbidities that complicate the patient evaluation  COPD   Additional history obtained:  Additional history obtained from medical records External records from outside source obtained and reviewed including medical records   Lab Tests:  I Ordered, and personally interpreted labs.  The pertinent results include: No leukocytosis, no anemia, normal kidney function, normal liver function, unremarkable electrolytes, unremarkable VBG, elevated lactic acid, normal troponin, normal BNP   Imaging Studies ordered:  I ordered imaging studies including CTA chest, CT scan head, chest x-ray I independently visualized and interpreted imaging which showed no pulmonary embolus, no other acute cardiopulmonary process, no acute intracranial findings I agree with the radiologist interpretation   Cardiac Monitoring: / EKG:  The patient was maintained on a cardiac monitor.  I personally viewed and interpreted the cardiac monitored which showed an underlying rhythm of: Sinus tachycardia, no ST/T wave changes, no ischemic changes, no STEMI   Consultations Obtained:  I requested consultation with the hospitalist,  and discussed lab and imaging findings as well as pertinent plan - they recommend: Admission   Problem List / ED Course / Critical interventions / Medication management  Patient is doing well at this time and does remain stable.  Wheezing has improved on exam but she does continue to remain short of Sanchez.  Will plan for admission to the hospital service given her COPD exacerbation which is currently failing outpatient management with oral steroids.  Vital signs have improved in the emergency department with IV fluids.  Blood work is otherwise been unremarkable at this point other than lactic acidosis.  She has no  leukocytosis at this point.  She has not been febrile.  She has no indication for septic shock and have avoided full sepsis bolus of  fluids.  Have discussed patient case with Dr. Barbra with the hospitalist service who has excepted for admission. I ordered medication including Rocephin , azithromycin , Toradol , Solu-Medrol , albuterol , IV fluids for COPD exacerbation Reevaluation of the patient after these medicines showed that the patient improved I have reviewed the patients home medicines and have made adjustments as needed   Social Determinants of Health:  None   Test / Admission - Considered:  Admission     Final diagnoses:  COPD exacerbation (HCC)  Acute respiratory failure with hypoxia [facility])    ED Discharge Orders     None          Daralene Lonni JONETTA DEVONNA 11/28/24 1931  "

## 2024-11-28 NOTE — ED Triage Notes (Signed)
 Pt c/o continued SOB and headache.  Pt was seen here for the same on 1/6.  Sts she's not feeling any better.  Hx of COPD.   Pt is 90% on normal 2L [place].

## 2024-11-28 NOTE — H&P (Signed)
 " History and Physical    Patient: Alicia Sanchez FMW:[id number] DOB: 04-May-1961 DOA: 11/28/2024 DOS: the patient was seen and examined on 11/28/2024 PCP: Jolinda Norene HERO, DO  Patient coming from: Home  Chief Complaint:  Chief Complaint  Patient presents with   Shortness of Breath   Headache   HPI: Alicia Sanchez is a 64 y.o. female with medical history significant of chronic respiratory failure, COPD, CKD, restless leg, anxiety.  Patient presents with worsening shortness of breath with increased oxygen  requirement.  She initially came to the hospital on 1/6 and was discharged to home on oral prednisone  which she has been compliant with.  She is normally on 2 L nasal cannula but had to increase this at home.  No fevers, chills, nausea, vomiting.  She has had increased thick purulent sputum.  She came back to the hospital due to the increasing O2 requirement.   Review of Systems: As mentioned in the history of present illness. All other systems reviewed and are negative. Past Medical History:  Diagnosis Date   Anxiety    Aortic atherosclerosis    Chronic kidney disease    COPD (chronic obstructive pulmonary disease) (HCC)    Depression    Headache    History of chest pain    History of syncope    Hypotension    Osteoporosis    Palpitations    Panic attacks    Pneumonia    Restless legs syndrome (RLS)    Past Surgical History:  Procedure Laterality Date   ABDOMINAL HYSTERECTOMY     BIOPSY  05/20/2023   Procedure: BIOPSY;  Surgeon: Legrand Victory LITTIE DOUGLAS, MD;  Location: [facility];  Service: Gastroenterology;;   COLONOSCOPY WITH PROPOFOL  N/A 05/20/2023   Procedure: COLONOSCOPY WITH PROPOFOL ;  Surgeon: Legrand Victory LITTIE DOUGLAS, MD;  Location: [facility];  Service: Gastroenterology;  Laterality: N/A;   ESOPHAGOGASTRODUODENOSCOPY (EGD) WITH PROPOFOL  N/A 05/20/2023   Procedure: ESOPHAGOGASTRODUODENOSCOPY (EGD) WITH PROPOFOL ;  Surgeon: Legrand Victory LITTIE DOUGLAS, MD;  Location: [facility];  Service:  Gastroenterology;  Laterality: N/A;   Social History:  reports that she has been smoking cigarettes. She has a 21 pack-year smoking history. She has never used smokeless tobacco. She reports current alcohol use. She reports current drug use. Frequency: 1.00 time per week. Drug: Marijuana.  Allergies[1]  Family History  Problem Relation Age of Onset   COPD Mother    COPD Father    Diabetes type II Sister    Kidney failure Sister    Diabetes type II Brother    COPD Maternal Grandfather    Breast cancer Neg Hx    Liver disease Neg Hx    Esophageal cancer Neg Hx    Colon cancer Neg Hx     Prior to Admission medications  Medication Sig Start Date End Date Taking? Authorizing Provider  acetaminophen  (TYLENOL ) 500 MG tablet Take 1,000 mg by mouth every 6 (six) hours as needed for moderate pain.    [provider]  albuterol  (VENTOLIN  HFA) 108 (90 Base) MCG/ACT inhaler INHALE 2 PUFFS BY MOUTH EVERY 6 HOURS AS NEEDED FOR WHEEZING FOR SHORTNESS OF BREATH 06/08/24   Jolinda Norene M, DO  alendronate  (FOSAMAX ) 70 MG tablet TAKE 1 TABLET EVERY 7 DAYS WITH A FULL GLASS OF WATER ON AN EMPTY STOMACH 01/17/24   Jolinda Norene M, DO  ALPRAZolam  (XANAX ) 0.5 MG tablet Take 1 tablet by mouth 4 times daily 11/06/24   Mozingo, Regina Nattalie, NP  aspirin  EC 81 MG  tablet Take 81 mg by mouth daily.    [provider]  atorvastatin  (LIPITOR) 40 MG tablet Do not restart until ok by primary care provider 04/21/24   Tat, Alm, MD  azithromycin  (ZITHROMAX ) 250 MG tablet Take 1 tablet (250 mg total) by mouth every Monday, Wednesday, and Friday. 10/19/24   Hope Almarie ORN, NP  budeson-glycopyrrolate -formoterol  (BREZTRI  AEROSPHERE) 160-9-4.8 MCG/ACT AERO Inhale 2 puffs into the lungs 2 (two) times daily. 02/11/24   Jolinda Norene HERO, DO  cefdinir  (OMNICEF ) 300 MG capsule Take 1 capsule (300 mg total) by mouth 2 (two) times daily. 11/25/24   Hope Almarie ORN, NP  cetirizine  (ZYRTEC ) 10 MG  tablet Take 10 mg by mouth daily. 11/20/23   [provider]  Cholecalciferol (VITAMIN D3 MAXIMUM STRENGTH) 125 MCG (5000 UT) capsule Take 5,000 Units by mouth daily.    [provider]  DULoxetine  (CYMBALTA ) 60 MG capsule Take 2 capsules (120 mg total) by mouth daily. 01/17/24   Jolinda Norene HERO, DO  ipratropium-albuterol  (DUONEB) 0.5-2.5 (3) MG/3ML SOLN INHALE THE CONTENTS OF 1 VIAL VIA NEBULIZER EVERY 6 HOURS AS NEEDED 10/08/24   Mannam, Praveen, MD  midodrine  (PROAMATINE ) 10 MG tablet Take 1 tablet (10 mg total) by mouth 3 (three) times daily with meals. 08/28/24   Jolinda Norene HERO, DO  midodrine  (PROAMATINE ) 2.5 MG tablet Take 2.5 mg by mouth 3 (three) times daily. 11/04/24   [provider]  nitroGLYCERIN  (NITROSTAT ) 0.4 MG SL tablet Place 1 tablet (0.4 mg total) under the tongue every 5 (five) minutes x 3 doses as needed for chest pain (if o relief after 2nd dose, proceed to the ED for an evaluation or call 911). 01/28/23   Miriam Almarie, NP  predniSONE  (DELTASONE ) 10 MG tablet Days 1-4 take 4 tablets (40 mg) daily  Days 5-8 take 3 tablets (30 mg) daily, Days 9-11 take 2 tablets (20 mg) daily, Days 12-14 take 1 tablet (10 mg) daily. Patient not taking: Reported on 11/25/2024 10/06/24   Lavell Bari LABOR, FNP  predniSONE  (DELTASONE ) 10 MG tablet Take 4 tablets (40 mg total) by mouth daily for 4 days. 11/24/24 11/28/24  Kammerer, Megan L, DO  QUEtiapine  (SEROQUEL ) 200 MG tablet Take 1 tablet (200 mg total) by mouth at bedtime. 07/08/24   Mozingo, Regina Nattalie, NP  Respiratory Therapy Supplies (NEBULIZER/TUBING/MOUTHPIECE) KIT 1 Units by Does not apply route 4 (four) times daily. GIVE face mask and tubing for nebulizer. J44.1 11/21/21   Jolinda Norene HERO, DO  rOPINIRole  (REQUIP ) 3 MG tablet Take 1 tablet (3 mg total) by mouth at bedtime. 01/17/24   Jolinda Norene HERO, DO    Physical Exam: Vitals:   11/28/24 1617 11/28/24 1730 11/28/24 1800 11/28/24 1830  BP: 123/81  (!) 145/88 135/74 133/69  Pulse: (!) 131 (!) 120 (!) 121 (!) 113  Resp: (!) 28 (!) 25 (!) 25 (!) 23  Temp: 98.7 F (37.1 C)     SpO2: 91% 94% 97% 97%  Weight:      Height:       General: Elderly. Awake and alert and oriented x3. No acute cardiopulmonary distress.  HEENT: Normocephalic atraumatic.  Right and left ears normal in appearance.  Pupils equal, round, reactive to light. Extraocular muscles are intact. Sclerae anicteric and noninjected.  Moist mucosal membranes. No mucosal lesions.  Neck: Neck supple without lymphadenopathy. No carotid bruits. No masses palpated.  Cardiovascular: Regular rate with normal S1-S2 sounds. No murmurs, rubs, gallops auscultated. No JVD.  Respiratory:  Wheezing throughout.  No rales.  No accessory muscle use. Abdomen: Soft, nontender, nondistended. Active bowel sounds. No masses or hepatosplenomegaly  Skin: No rashes, lesions, or ulcerations.  Dry, warm to touch. 2+ dorsalis pedis and radial pulses. Musculoskeletal: No calf or leg pain. All major joints not erythematous nontender.  No upper or lower joint deformation.  Good ROM.  No contractures  Psychiatric: Intact judgment and insight. Pleasant and cooperative. Neurologic: No focal neurological deficits. Strength is 5/5 and symmetric in upper and lower extremities.  Cranial nerves II through XII are grossly intact.  Data Reviewed: Labs and imaging reviewed by me  Assessment and Plan: No notes have been filed under this hospital service. Service: Hospitalist  Principal Problem:   Acute on chronic respiratory failure with hypoxia (HCC) Active Problems:   Generalized anxiety disorder   GOLD COPD II D   COPD with acute exacerbation (HCC)   Mixed hyperlipidemia   Low blood pressure  Acute on chronic respiratory failure with hypoxia COPD with acute exacerbation Antibiotics: Rocephin  DuoNeb's every 6 scheduled with albuterol  every 2 when necessary Continue inhaled steroids and LA  bronchodilator Solu-Medrol  60 mg IV every 12 hours Mucinex  Low blood pressure Midodrine  Generalized anxiety On Seroquel  and SSRIs Hyperlipidemia   Advance Care Planning:   Code Status: Full Code confirmed by patient  Consults: None  Family Communication: None  Severity of Illness: The appropriate patient status for this patient is INPATIENT. Inpatient status is judged to be reasonable and necessary in order to provide the required intensity of service to ensure the patient's safety. The patient's presenting symptoms, physical exam findings, and initial radiographic and laboratory data in the context of their chronic comorbidities is felt to place them at high risk for further clinical deterioration. Furthermore, it is not anticipated that the patient will be medically stable for discharge from the hospital within 2 midnights of admission.   * I certify that at the point of admission it is my clinical judgment that the patient will require inpatient hospital care spanning beyond 2 midnights from the point of admission due to high intensity of service, high risk for further deterioration and high frequency of surveillance required.*  Author: Lamar Meter J Joselinne Lawal, DO 11/28/2024 7:46 PM  For on call review [email or web address].     [1]  Allergies Allergen Reactions   Doxycycline  Other (See Comments)    Chest pain   "

## 2024-11-29 DIAGNOSIS — J9621 Acute and chronic respiratory failure with hypoxia: Secondary | ICD-10-CM

## 2024-11-29 LAB — CBC
HCT: 37.4 % (ref 36.0–46.0)
Hemoglobin: 12.4 g/dL (ref 12.0–15.0)
MCH: 30.5 pg (ref 26.0–34.0)
MCHC: 33.2 g/dL (ref 30.0–36.0)
MCV: 91.9 fL (ref 80.0–100.0)
Platelets: 277 K/uL (ref 150–400)
RBC: 4.07 MIL/uL (ref 3.87–5.11)
RDW: 12.4 % (ref 11.5–15.5)
WBC: 8.6 K/uL (ref 4.0–10.5)
nRBC: 0 % (ref 0.0–0.2)

## 2024-11-29 LAB — BASIC METABOLIC PANEL WITH GFR
Anion gap: 3 — ABNORMAL LOW (ref 5–15)
BUN: 11 mg/dL (ref 8–23)
CO2: 34 mmol/L — ABNORMAL HIGH (ref 22–32)
Calcium: 8.2 mg/dL — ABNORMAL LOW (ref 8.9–10.3)
Chloride: 104 mmol/L (ref 98–111)
Creatinine, Ser: 0.65 mg/dL (ref 0.44–1.00)
GFR, Estimated: >60 mL/min
Glucose, Bld: 109 mg/dL — ABNORMAL HIGH (ref 70–99)
Potassium: 4.4 mmol/L (ref 3.5–5.1)
Sodium: 140 mmol/L (ref 135–145)

## 2024-11-29 MED ORDER — IPRATROPIUM-ALBUTEROL 0.5-2.5 (3) MG/3ML IN SOLN
3.0000 mL | Freq: Four times a day (QID) | RESPIRATORY_TRACT | Status: DC
  Administered 2024-11-29 – 2024-11-30 (×2): 3 mL via RESPIRATORY_TRACT
  Filled 2024-11-29: qty 3

## 2024-11-29 MED ORDER — ALBUTEROL SULFATE (2.5 MG/3ML) 0.083% IN NEBU: 2.5000 mg | INHALATION_SOLUTION | RESPIRATORY_TRACT | Status: DC | PRN

## 2024-11-29 MED ORDER — NICOTINE 21 MG/24HR TD PT24
21.0000 mg | MEDICATED_PATCH | Freq: Every day | TRANSDERMAL | Status: DC
  Administered 2024-11-29 – 2024-11-30 (×2): 21 mg via TRANSDERMAL
  Filled 2024-11-29 (×2): qty 1

## 2024-11-29 MED ORDER — METHYLPREDNISOLONE SODIUM SUCC 125 MG IJ SOLR
62.5000 mg | Freq: Two times a day (BID) | INTRAMUSCULAR | Status: DC
  Administered 2024-11-29 – 2024-11-30 (×2): 62.5 mg via INTRAVENOUS
  Filled 2024-11-29 (×2): qty 2

## 2024-11-29 MED ORDER — AZITHROMYCIN 250 MG PO TABS
500.0000 mg | ORAL_TABLET | Freq: Every day | ORAL | Status: DC
  Administered 2024-11-29 – 2024-11-30 (×2): 500 mg via ORAL
  Filled 2024-11-29 (×2): qty 2

## 2024-11-29 MED ORDER — ACETAMINOPHEN 325 MG PO TABS
650.0000 mg | ORAL_TABLET | Freq: Four times a day (QID) | ORAL | Status: DC | PRN
  Administered 2024-11-29 – 2024-11-30 (×2): 650 mg via ORAL
  Filled 2024-11-29 (×2): qty 2

## 2024-11-29 NOTE — Plan of Care (Signed)
" °  Problem: Education: Goal: Knowledge of General Education information will improve Description: Including pain rating scale, medication(s)/side effects and non-pharmacologic comfort measures Outcome: Progressing   Problem: Health Behavior/Discharge Planning: Goal: Ability to manage health-related needs will improve Outcome: Progressing   Problem: Clinical Measurements: Goal: Ability to maintain clinical measurements within normal limits will improve Outcome: Progressing Goal: Will remain free from infection Outcome: Progressing Goal: Diagnostic test results will improve Outcome: Progressing Goal: Respiratory complications will improve Outcome: Progressing Goal: Cardiovascular complication will be avoided Outcome: Progressing   Problem: Activity: Goal: Risk for activity intolerance will decrease Outcome: Progressing   Problem: Nutrition: Goal: Adequate nutrition will be maintained Outcome: Progressing   Problem: Coping: Goal: Level of anxiety will decrease Outcome: Progressing   Problem: Elimination: Goal: Will not experience complications related to bowel motility Outcome: Progressing Goal: Will not experience complications related to urinary retention Outcome: Progressing   Problem: Pain Managment: Goal: General experience of comfort will improve and/or be controlled Outcome: Progressing   Problem: Safety: Goal: Ability to remain free from injury will improve Outcome: Progressing   Problem: Skin Integrity: Goal: Risk for impaired skin integrity will decrease Outcome: Progressing   Problem: Education: Goal: Knowledge of General Education information will improve Description: Including pain rating scale, medication(s)/side effects and non-pharmacologic comfort measures Outcome: Progressing   Problem: Health Behavior/Discharge Planning: Goal: Ability to manage health-related needs will improve Outcome: Progressing   Problem: Clinical Measurements: Goal:  Ability to maintain clinical measurements within normal limits will improve Outcome: Progressing Goal: Will remain free from infection Outcome: Progressing Goal: Diagnostic test results will improve Outcome: Progressing Goal: Respiratory complications will improve Outcome: Progressing Goal: Cardiovascular complication will be avoided Outcome: Progressing   Problem: Activity: Goal: Risk for activity intolerance will decrease Outcome: Progressing   Problem: Nutrition: Goal: Adequate nutrition will be maintained Outcome: Progressing   Problem: Coping: Goal: Level of anxiety will decrease Outcome: Progressing   Problem: Elimination: Goal: Will not experience complications related to bowel motility Outcome: Progressing Goal: Will not experience complications related to urinary retention Outcome: Progressing   Problem: Pain Managment: Goal: General experience of comfort will improve and/or be controlled Outcome: Progressing   Problem: Safety: Goal: Ability to remain free from injury will improve Outcome: Progressing   Problem: Skin Integrity: Goal: Risk for impaired skin integrity will decrease Outcome: Progressing   Problem: Education: Goal: Knowledge of disease or condition will improve Outcome: Progressing Goal: Knowledge of the prescribed therapeutic regimen will improve Outcome: Progressing Goal: Individualized Educational Video(s) Outcome: Progressing   Problem: Activity: Goal: Ability to tolerate increased activity will improve Outcome: Progressing Goal: Will verbalize the importance of balancing activity with adequate rest periods Outcome: Progressing   Problem: Respiratory: Goal: Ability to maintain a clear airway will improve Outcome: Progressing Goal: Levels of oxygenation will improve Outcome: Progressing Goal: Ability to maintain adequate ventilation will improve Outcome: Progressing   Problem: Education: Goal: Knowledge of disease or condition  will improve Outcome: Progressing Goal: Knowledge of the prescribed therapeutic regimen will improve Outcome: Progressing Goal: Individualized Educational Video(s) Outcome: Progressing   Problem: Activity: Goal: Ability to tolerate increased activity will improve Outcome: Progressing Goal: Will verbalize the importance of balancing activity with adequate rest periods Outcome: Progressing   Problem: Respiratory: Goal: Ability to maintain a clear airway will improve Outcome: Progressing Goal: Levels of oxygenation will improve Outcome: Progressing Goal: Ability to maintain adequate ventilation will improve Outcome: Progressing   "

## 2024-11-29 NOTE — Progress Notes (Signed)
 " PROGRESS NOTE  Alicia Sanchez, is a 64 y.o. female, DOB - 01-21-1961, FMW:[id number]  Admit date - 11/28/2024   Admitting Physician Alicia JINNY Peel, DO  Outpatient Primary MD for the patient is Alicia Norene HERO, DO  LOS - 1  Chief Complaint  Patient presents with   Shortness of Breath   Headache      Brief Narrative:   64 y.o. female with medical history significant of chronic respiratory failure, COPD, CKD, restless leg, anxiety admitted on 11/28/2024 please with acute on chronic hypoxic respiratory failure due to COPD exacerbation    -Assessment and Plan: 1)Acute COPD Exacerbation--CTA chest without pneumonia or PE, extensive emphysema noted -- No Leukocytosis - COVID, flu and RSV negative -- Treat with bronchodilators, mucolytics, Solu-Medrol  and azithromycin   2) acute on chronic hypoxic respite failure--due to #1 above -at baseline patient uses 2 L of oxygen  cannula mostly as needed - Currently requiring 3 to 4 L continuously  3)Depression/Anxiety--- continue Cymbalta  daily, Seroquel  nightly, and Xanax  as needed  4)RLS--- continue Requip   5) tobacco abuse--- Smoking cessation counseling for 4 minutes today,  Give Nicotine  patch I have discussed tobacco cessation with the patient.  I have counseled the patient regarding the negative impacts of continued tobacco use including but not limited to lung cancer, COPD, and cardiovascular disease.  I have discussed alternatives to tobacco and modalities that may help facilitate tobacco cessation including but not limited to biofeedback, hypnosis, and medications.  Total time spent with tobacco counseling was 4 minutes.  Status is: Inpatient   Disposition: The patient is from: Home              Anticipated d/c is to: Home              Anticipated d/c date is: 2 days              Patient currently is not medically stable to d/c. Barriers: Not Clinically Stable-   Code Status :  -  Code Status: Full Code   Family  Communication:    NA (patient is alert, awake and coherent)   DVT Prophylaxis  :   - SCDs   enoxaparin  (LOVENOX ) injection 40 mg Start: 11/28/24 2200   Lab Results  Component Value Date   PLT 277 11/29/2024    Inpatient Medications  Scheduled Meds:  ALPRAZolam   0.5 mg Oral QID   aspirin  EC  81 mg Oral Daily   atorvastatin   40 mg Oral QHS   budesonide -glycopyrrolate -formoterol   2 puff Inhalation BID   DULoxetine   120 mg Oral Daily   enoxaparin  (LOVENOX ) injection  40 mg Subcutaneous QHS   guaiFENesin   600 mg Oral BID   ipratropium  0.5 mg Nebulization Q6H   midodrine   10 mg Oral TID WC   nicotine   21 mg Transdermal Daily   [START ON 11/30/2024] predniSONE   40 mg Oral Q breakfast   QUEtiapine   200 mg Oral QHS   rOPINIRole   3 mg Oral QHS   Continuous Infusions:  cefTRIAXone  (ROCEPHIN )  IV     PRN Meds:.acetaminophen , ondansetron  **OR** ondansetron  (ZOFRAN ) IV   Anti-infectives (From admission, onward)    Start     Dose/Rate Route Frequency Ordered Stop   11/29/24 1900  cefTRIAXone  (ROCEPHIN ) 1 g in sodium chloride  0.9 % 100 mL IVPB        1 g 200 mL/hr over 30 Minutes Intravenous Every 24 hours 11/28/24 2019 12/03/24 1859   11/28/24 1645  cefTRIAXone  (ROCEPHIN ) 1  g in sodium chloride  0.9 % 100 mL IVPB        1 g 200 mL/hr over 30 Minutes Intravenous  Once 11/28/24 1630 11/28/24 1735   11/28/24 1645  azithromycin  (ZITHROMAX ) tablet 500 mg        500 mg Oral  Once 11/28/24 1630 11/28/24 1801         Subjective: Alicia Sanchez today has no fevers, no emesis,  No chest pain,    Cough and dyspnea persist - Wheezing persist - Increase oxygen  requirement persist   Objective: Vitals:   11/29/24 0849 11/29/24 0851 11/29/24 1213 11/29/24 1437  BP:   108/63   Pulse:   82   Resp:   16   Temp:   98 F (36.7 C)   TempSrc:   Oral   SpO2: 96% 100% 98% 96%  Weight:      Height:        Intake/Output Summary (Last 24 hours) at 11/29/2024 1756 Last data filed at  11/29/2024 0600 Gross per 24 hour  Intake 1300 ml  Output --  Net 1300 ml   Filed Weights   11/28/24 1617 11/28/24 2000  Weight: 52.6 kg 54.2 kg    Physical Exam  Gen:- Awake Alert, no conversational dyspnea HEENT:- [place].AT, No sclera icterus Nose---[place] 4 L/min Neck-Supple Neck,No JVD,.  Lungs-diminished breath sounds with scattered wheezes bilaterally  CV- S1, S2 normal, regular  Abd-  +ve B.Sounds, Abd Soft, No tenderness,    Extremity/Skin:- No  edema, pedal pulses present  Psych-affect is appropriate, oriented x3 Neuro-no new focal deficits, no tremors  Data Reviewed: I have personally reviewed following labs and imaging studies  CBC: Recent Labs  Lab 11/24/24 2205 11/28/24 1656 11/29/24 0541  WBC 15.3* 9.9 8.6  HGB 16.1* 15.7* 12.4  HCT 48.0* 46.9* 37.4  MCV 91.3 91.4 91.9  PLT 262 320 277   Basic Metabolic Panel: Recent Labs  Lab 11/24/24 2205 11/28/24 1656 11/29/24 0541  NA 139 141 140  K 4.6 4.0 4.4  CL 100 102 104  CO2 34* 23 34*  GLUCOSE 103* 134* 109*  BUN 14 13 11   CREATININE 0.88 0.80 0.65  CALCIUM  9.1 9.4 8.2*   GFR: Estimated Creatinine Clearance: 60.8 mL/min (by C-G formula based on SCr of 0.65 mg/dL). Liver Function Tests: Recent Labs  Lab 11/28/24 1656  AST 23  ALT 18  ALKPHOS 65  BILITOT 0.3  PROT 7.2  ALBUMIN 4.4   BNP (last 3 results) Recent Labs    11/24/24 2205 11/28/24 1656  PROBNP 69.0 88.3   Recent Results (from the past 240 hours)  Culture, blood (routine x 2)     Status: None (Preliminary result)   Collection Time: 11/28/24  4:56 PM   Specimen: BLOOD  Result Value Ref Range Status   Specimen Description BLOOD LEFT ANTECUBITAL  Final   Special Requests   Final    Blood Culture adequate volume BOTTLES DRAWN AEROBIC AND ANAEROBIC   Culture   Final    NO GROWTH < 24 HOURS Performed at [facility], [address], [place], [place] [address]    Report Status PENDING  Incomplete  Culture, blood (routine x 2)      Status: None (Preliminary result)   Collection Time: 11/28/24  4:56 PM   Specimen: BLOOD  Result Value Ref Range Status   Specimen Description BLOOD BLOOD RIGHT ARM  Final   Special Requests   Final    BOTTLES DRAWN AEROBIC AND  ANAEROBIC Blood Culture adequate volume   Culture   Final    NO GROWTH < 24 HOURS Performed at [facility], [address], [place], [place] [address]    Report Status PENDING  Incomplete  Resp panel by RT-PCR (RSV, Flu A&B, Covid) Anterior Nasal Swab     Status: None   Collection Time: 11/28/24  7:04 PM   Specimen: Anterior Nasal Swab  Result Value Ref Range Status   SARS Coronavirus 2 by RT PCR NEGATIVE NEGATIVE Final    Comment: (NOTE) SARS-CoV-2 target nucleic acids are NOT DETECTED.  The SARS-CoV-2 RNA is generally detectable in upper respiratory specimens during the acute phase of infection. The lowest concentration of SARS-CoV-2 viral copies this assay can detect is 138 copies/mL. A negative result does not preclude SARS-Cov-2 infection and should not be used as the sole basis for treatment or other patient management decisions. A negative result may occur with  improper specimen collection/handling, submission of specimen other than nasopharyngeal swab, presence of viral mutation(s) within the areas targeted by this assay, and inadequate number of viral copies(<138 copies/mL). A negative result must be combined with clinical observations, patient history, and epidemiological information. The expected result is Negative.  Fact Sheet for Patients:  bloggercourse.com  Fact Sheet for Healthcare Providers:  seriousbroker.it  This test is no t yet approved or cleared by the [place]  FDA and  has been authorized for detection and/or diagnosis of SARS-CoV-2 by FDA under an Emergency Use Authorization (EUA). This EUA will remain  in effect (meaning this test can be used) for the duration of  the COVID-19 declaration under Section 564(b)(1) of the Act, 21 U.S.C.section 360bbb-3(b)(1), unless the authorization is terminated  or revoked sooner.       Influenza A by PCR NEGATIVE NEGATIVE Final   Influenza B by PCR NEGATIVE NEGATIVE Final    Comment: (NOTE) The Xpert Xpress SARS-CoV-2/FLU/RSV plus assay is intended as an aid in the diagnosis of influenza from Nasopharyngeal swab specimens and should not be used as a sole basis for treatment. Nasal washings and aspirates are unacceptable for Xpert Xpress SARS-CoV-2/FLU/RSV testing.  Fact Sheet for Patients: bloggercourse.com  Fact Sheet for Healthcare Providers: seriousbroker.it  This test is not yet approved or cleared by the [place]  FDA and has been authorized for detection and/or diagnosis of SARS-CoV-2 by FDA under an Emergency Use Authorization (EUA). This EUA will remain in effect (meaning this test can be used) for the duration of the COVID-19 declaration under Section 564(b)(1) of the Act, 21 U.S.C. section 360bbb-3(b)(1), unless the authorization is terminated or revoked.     Resp Syncytial Virus by PCR NEGATIVE NEGATIVE Final    Comment: (NOTE) Fact Sheet for Patients: bloggercourse.com  Fact Sheet for Healthcare Providers: seriousbroker.it  This test is not yet approved or cleared by the [place]  FDA and has been authorized for detection and/or diagnosis of SARS-CoV-2 by FDA under an Emergency Use Authorization (EUA). This EUA will remain in effect (meaning this test can be used) for the duration of the COVID-19 declaration under Section 564(b)(1) of the Act, 21 U.S.C. section 360bbb-3(b)(1), unless the authorization is terminated or revoked.  Performed at [facility], [address] [place], [place] [address]    Radiology Studies: CT Angio Chest PE W/Cm &/Or Wo Cm Result Date:  11/28/2024 CLINICAL DATA:  Shortness of breath and headache. EXAM: CT ANGIOGRAPHY CHEST WITH CONTRAST TECHNIQUE: Multidetector CT imaging of the chest was performed using the standard protocol during  bolus administration of intravenous contrast. Multiplanar CT image reconstructions and MIPs were obtained to evaluate the vascular anatomy. RADIATION DOSE REDUCTION: This exam was performed according to the departmental dose-optimization program which includes automated exposure control, adjustment of the mA and/or kV according to patient size and/or use of iterative reconstruction technique. CONTRAST:  75mL OMNIPAQUE  IOHEXOL  350 MG/ML SOLN COMPARISON:  None Available. FINDINGS: Cardiovascular: Satisfactory opacification of the pulmonary arteries to the segmental level. No evidence of pulmonary embolism. Normal heart size. No pericardial effusion. Mediastinum/Nodes: No enlarged mediastinal, hilar, or axillary lymph nodes. Thyroid  gland, trachea, and esophagus demonstrate no significant findings. Lungs/Pleura: There is extensive emphysematous lung disease with a 3 mm calcified pulmonary nodule seen within the posterolateral aspect of the left upper lobe (axial CT image 26, CT series 11). Additional punctate calcified lung nodule is seen within the anterior aspect of the left upper lobe (axial CT image 42, CT series 11). No acute infiltrate, pleural effusion or pneumothorax is identified Upper Abdomen: No acute abnormality. Musculoskeletal: No chest wall abnormality. No acute or significant osseous findings. Review of the MIP images confirms the above findings. IMPRESSION: 1. No evidence of pulmonary embolism or other acute intrathoracic process. 2. Extensive emphysematous lung disease. 3. Evidence of prior granulomatous disease. Electronically Signed   By: Suzen Dials M.D.   On: 11/28/2024 18:08   CT Head Wo Contrast Result Date: 11/28/2024 EXAM: CT HEAD WITHOUT CONTRAST 11/28/2024 05:56:05 PM TECHNIQUE: CT of  the head was performed without the administration of intravenous contrast. Automated exposure control, iterative reconstruction, and/or weight based adjustment of the mA/kV was utilized to reduce the radiation dose to as low as reasonably achievable. COMPARISON: CT head 11/20/2015. CLINICAL HISTORY: Headache, increasing frequency or severity. FINDINGS: BRAIN AND VENTRICLES: There is no evidence of an acute infarct, intracranial hemorrhage, mass, midline shift, hydrocephalus, or extra-axial fluid collection. Cerebral volume is within normal limits for age. Calcified atherosclerosis at the skull base. ORBITS: No acute abnormality. SINUSES: No acute abnormality. SOFT TISSUES AND SKULL: No acute soft tissue abnormality. No skull fracture. IMPRESSION: 1. Negative head CT. Electronically signed by: Dasie Hamburg MD MD 11/28/2024 06:00 PM EST RP Workstation: [id number]   DG Chest Portable 1 View Result Date: 11/28/2024 CLINICAL DATA:  Short of breath, COPD EXAM: PORTABLE CHEST 1 VIEW COMPARISON:  11/24/2024 FINDINGS: Single frontal view of the chest demonstrates an unremarkable cardiac silhouette. Background emphysema without acute airspace disease, effusion, or pneumothorax. No acute bony abnormalities. IMPRESSION: 1. Stable emphysema.  No acute airspace disease. Electronically Signed   By: Ozell Daring M.D.   On: 11/28/2024 17:03   Scheduled Meds:  ALPRAZolam   0.5 mg Oral QID   aspirin  EC  81 mg Oral Daily   atorvastatin   40 mg Oral QHS   budesonide -glycopyrrolate -formoterol   2 puff Inhalation BID   DULoxetine   120 mg Oral Daily   enoxaparin  (LOVENOX ) injection  40 mg Subcutaneous QHS   guaiFENesin   600 mg Oral BID   ipratropium  0.5 mg Nebulization Q6H   midodrine   10 mg Oral TID WC   nicotine   21 mg Transdermal Daily   [START ON 11/30/2024] predniSONE   40 mg Oral Q breakfast   QUEtiapine   200 mg Oral QHS   rOPINIRole   3 mg Oral QHS   Continuous Infusions:  cefTRIAXone  (ROCEPHIN )  IV      LOS: 1  day   Rendall Carwin M.D on 11/29/2024 at 5:56 PM  Go to [email or web address] - for contact info  [facility] -  Office  [phone]  If 7PM-7AM, please contact night-coverage [email or web address] 11/29/2024, 5:56 PM    "

## 2024-11-30 DIAGNOSIS — J9621 Acute and chronic respiratory failure with hypoxia: Secondary | ICD-10-CM | POA: Diagnosis not present

## 2024-11-30 MED ORDER — ALBUTEROL SULFATE HFA 108 (90 BASE) MCG/ACT IN AERS: 2.0000 | INHALATION_SPRAY | RESPIRATORY_TRACT | 2 refills | Status: AC | PRN

## 2024-11-30 MED ORDER — NICOTINE 21 MG/24HR TD PT24: 21.0000 mg | MEDICATED_PATCH | Freq: Every day | TRANSDERMAL | 0 refills | Status: DC

## 2024-11-30 MED ORDER — ASPIRIN EC 81 MG PO TBEC: 81.0000 mg | DELAYED_RELEASE_TABLET | Freq: Every day | ORAL | 11 refills | Status: AC

## 2024-11-30 MED ORDER — CEFUROXIME AXETIL 500 MG PO TABS: 500.0000 mg | ORAL_TABLET | Freq: Two times a day (BID) | ORAL | 0 refills | Status: AC

## 2024-11-30 MED ORDER — BREZTRI AEROSPHERE 160-9-4.8 MCG/ACT IN AERO: 2.0000 | INHALATION_SPRAY | Freq: Two times a day (BID) | RESPIRATORY_TRACT | 5 refills | Status: AC

## 2024-11-30 MED ORDER — GUAIFENESIN ER 600 MG PO TB12: 600.0000 mg | ORAL_TABLET | Freq: Two times a day (BID) | ORAL | 0 refills | Status: AC

## 2024-11-30 MED ORDER — IPRATROPIUM-ALBUTEROL 0.5-2.5 (3) MG/3ML IN SOLN: RESPIRATORY_TRACT | 3 refills | Status: AC

## 2024-11-30 MED ORDER — ACETAMINOPHEN 325 MG PO TABS: 650.0000 mg | ORAL_TABLET | Freq: Four times a day (QID) | ORAL | Status: AC | PRN

## 2024-11-30 MED ORDER — QUETIAPINE FUMARATE 200 MG PO TABS: 200.0000 mg | ORAL_TABLET | Freq: Every day | ORAL | 1 refills | Status: AC

## 2024-11-30 MED ORDER — PREDNISONE 20 MG PO TABS: 40.0000 mg | ORAL_TABLET | Freq: Every day | ORAL | 0 refills | Status: AC

## 2024-11-30 MED ORDER — DULOXETINE HCL 60 MG PO CPEP: 120.0000 mg | ORAL_CAPSULE | Freq: Every day | ORAL | 3 refills | Status: AC

## 2024-11-30 MED ORDER — MIDODRINE HCL 10 MG PO TABS: 10.0000 mg | ORAL_TABLET | Freq: Three times a day (TID) | ORAL | 3 refills | Status: AC

## 2024-11-30 MED ORDER — ATORVASTATIN CALCIUM 40 MG PO TABS: ORAL_TABLET | ORAL | 3 refills | Status: AC

## 2024-11-30 NOTE — Care Management Important Message (Signed)
 Important Message  Patient Details  Name: Alicia Sanchez MRN: [id number] Date of Birth: [birth year]   Important Message Given:  N/A - LOS <3 / Initial given by admissions     Duwaine LITTIE Ada 11/30/2024, 9:46 AM

## 2024-11-30 NOTE — Plan of Care (Signed)
 " Problem: Education: Goal: Knowledge of General Education information will improve Description: Including pain rating scale, medication(s)/side effects and non-pharmacologic comfort measures Outcome: Adequate for Discharge   Problem: Health Behavior/Discharge Planning: Goal: Ability to manage health-related needs will improve Outcome: Adequate for Discharge   Problem: Clinical Measurements: Goal: Ability to maintain clinical measurements within normal limits will improve Outcome: Adequate for Discharge Goal: Will remain free from infection Outcome: Adequate for Discharge Goal: Diagnostic test results will improve Outcome: Adequate for Discharge Goal: Respiratory complications will improve Outcome: Adequate for Discharge Goal: Cardiovascular complication will be avoided Outcome: Adequate for Discharge   Problem: Activity: Goal: Risk for activity intolerance will decrease Outcome: Adequate for Discharge   Problem: Nutrition: Goal: Adequate nutrition will be maintained Outcome: Adequate for Discharge   Problem: Coping: Goal: Level of anxiety will decrease Outcome: Adequate for Discharge   Problem: Elimination: Goal: Will not experience complications related to bowel motility Outcome: Adequate for Discharge Goal: Will not experience complications related to urinary retention Outcome: Adequate for Discharge   Problem: Pain Managment: Goal: General experience of comfort will improve and/or be controlled Outcome: Adequate for Discharge   Problem: Safety: Goal: Ability to remain free from injury will improve Outcome: Adequate for Discharge   Problem: Skin Integrity: Goal: Risk for impaired skin integrity will decrease Outcome: Adequate for Discharge   Problem: Education: Goal: Knowledge of General Education information will improve Description: Including pain rating scale, medication(s)/side effects and non-pharmacologic comfort measures Outcome: Adequate for  Discharge   Problem: Health Behavior/Discharge Planning: Goal: Ability to manage health-related needs will improve Outcome: Adequate for Discharge   Problem: Clinical Measurements: Goal: Ability to maintain clinical measurements within normal limits will improve Outcome: Adequate for Discharge Goal: Will remain free from infection Outcome: Adequate for Discharge Goal: Diagnostic test results will improve Outcome: Adequate for Discharge Goal: Respiratory complications will improve Outcome: Adequate for Discharge Goal: Cardiovascular complication will be avoided Outcome: Adequate for Discharge   Problem: Activity: Goal: Risk for activity intolerance will decrease Outcome: Adequate for Discharge   Problem: Nutrition: Goal: Adequate nutrition will be maintained Outcome: Adequate for Discharge   Problem: Coping: Goal: Level of anxiety will decrease Outcome: Adequate for Discharge   Problem: Elimination: Goal: Will not experience complications related to bowel motility Outcome: Adequate for Discharge Goal: Will not experience complications related to urinary retention Outcome: Adequate for Discharge   Problem: Pain Managment: Goal: General experience of comfort will improve and/or be controlled Outcome: Adequate for Discharge   Problem: Safety: Goal: Ability to remain free from injury will improve Outcome: Adequate for Discharge   Problem: Skin Integrity: Goal: Risk for impaired skin integrity will decrease Outcome: Adequate for Discharge   Problem: Education: Goal: Knowledge of disease or condition will improve Outcome: Adequate for Discharge Goal: Knowledge of the prescribed therapeutic regimen will improve Outcome: Adequate for Discharge Goal: Individualized Educational Video(s) Outcome: Adequate for Discharge   Problem: Activity: Goal: Ability to tolerate increased activity will improve Outcome: Adequate for Discharge Goal: Will verbalize the importance of  balancing activity with adequate rest periods Outcome: Adequate for Discharge   Problem: Respiratory: Goal: Ability to maintain a clear airway will improve Outcome: Adequate for Discharge Goal: Levels of oxygenation will improve Outcome: Adequate for Discharge Goal: Ability to maintain adequate ventilation will improve Outcome: Adequate for Discharge   Problem: Education: Goal: Knowledge of disease or condition will improve Outcome: Adequate for Discharge Goal: Knowledge of the prescribed therapeutic regimen will improve Outcome: Adequate for Discharge Goal: Individualized  Educational Video(s) Outcome: Adequate for Discharge   Problem: Activity: Goal: Ability to tolerate increased activity will improve Outcome: Adequate for Discharge Goal: Will verbalize the importance of balancing activity with adequate rest periods Outcome: Adequate for Discharge   Problem: Respiratory: Goal: Ability to maintain a clear airway will improve Outcome: Adequate for Discharge Goal: Levels of oxygenation will improve Outcome: Adequate for Discharge Goal: Ability to maintain adequate ventilation will improve Outcome: Adequate for Discharge   "

## 2024-11-30 NOTE — Progress Notes (Signed)
 Mobility Specialist Progress Note:    11/30/24 0920  Mobility  Activity Ambulated with assistance  Level of Assistance Independent  Assistive Device None  Distance Ambulated (ft) 120 ft  Range of Motion/Exercises Active;All extremities  Activity Response Tolerated well  Mobility Referral Yes  Mobility visit 1 Mobility  Mobility Specialist Start Time (ACUTE ONLY) 0920  Mobility Specialist Stop Time (ACUTE ONLY) 0940  Mobility Specialist Time Calculation (min) (ACUTE ONLY) 20 min   Pt received sitting EOB, MD requesting O2 test. Independently able to stand and ambulate with no AD. Tolerated well, SpO2 97% on 2L at rest and HR 134. During ambulation SpO2 95% on 2L and HR 141. Returned sitting EOB, all needs met.   Jisselle Poth Mobility Specialist Please contact via Special Educational Needs Teacher or  Rehab office at [phone]

## 2024-11-30 NOTE — Discharge Summary (Signed)
 "                                                                                 Alicia Sanchez, is a 64 y.o. female  DOB Jul 20, 1961  MRN [id number].  Admission date:  11/28/2024  Admitting Physician  Lang JINNY Peel, DO  Discharge Date:  11/30/2024   Primary MD  Jolinda Norene HERO, DO  Recommendations for primary care physician for things to follow:  1)you need oxygen  at home at 2 L via nasal cannula continuously while awake and while asleep--- smoking or having open fires around oxygen  can cause fire, significant injury and death  2)Avoid ibuprofen /Advil /Aleve /Motrin Josefine Powders/Naproxen /BC powders/Meloxicam /Diclofenac/Indomethacin and other Nonsteroidal anti-inflammatory medications as these will make you more likely to bleed and can cause stomach ulcers, can also cause Kidney problems.   3) please note that there has been changes to your medications--- please take medications as prescribed  4) follow-up with your primary care physician in 1 to 2 weeks for recheck and reevaluation  Admission Diagnosis  COPD exacerbation (HCC) [J44.1] Acute respiratory failure with hypoxia (HCC) [J96.01] Acute on chronic respiratory failure with hypoxia (HCC) [J96.21]   Discharge Diagnosis  COPD exacerbation (HCC) [J44.1] Acute respiratory failure with hypoxia (HCC) [J96.01] Acute on chronic respiratory failure with hypoxia (HCC) [J96.21]   Principal Problem:   Acute on chronic respiratory failure with hypoxia (HCC) Active Problems:   Generalized anxiety disorder   GOLD COPD II D   COPD with acute exacerbation (HCC)   Mixed hyperlipidemia   Low blood pressure     Past Medical History:  Diagnosis Date   Anxiety    Aortic atherosclerosis    Chronic kidney disease    COPD (chronic obstructive pulmonary disease) (HCC)    Depression    Dyspnea    Headache    History of chest pain    History of syncope    Hypotension    Osteoporosis    Palpitations    Panic attacks    Pneumonia     Restless legs syndrome (RLS)    Past Surgical History:  Procedure Laterality Date   ABDOMINAL HYSTERECTOMY     BIOPSY  05/20/2023   Procedure: BIOPSY;  Surgeon: Legrand Victory LITTIE DOUGLAS, MD;  Location: [facility];  Service: Gastroenterology;;   COLONOSCOPY WITH PROPOFOL  N/A 05/20/2023   Procedure: COLONOSCOPY WITH PROPOFOL ;  Surgeon: Legrand Victory LITTIE DOUGLAS, MD;  Location: [facility];  Service: Gastroenterology;  Laterality: N/A;   ESOPHAGOGASTRODUODENOSCOPY (EGD) WITH PROPOFOL  N/A 05/20/2023   Procedure: ESOPHAGOGASTRODUODENOSCOPY (EGD) WITH PROPOFOL ;  Surgeon: Legrand Victory LITTIE DOUGLAS, MD;  Location: [facility];  Service: Gastroenterology;  Laterality: N/A;     HPI  from the history and physical done on the day of admission:   HPI: Alicia Sanchez is a 64 y.o. female with medical history significant of chronic respiratory failure, COPD, CKD, restless leg, anxiety.  Patient presents with worsening shortness of breath with increased oxygen  requirement.  She initially came to the hospital on 1/6 and was discharged to home on oral prednisone  which she has been compliant with.  She is normally on 2 L nasal cannula but had to increase this at home.  No  fevers, chills, nausea, vomiting.  She has had increased thick purulent sputum.  She came back to the hospital due to the increasing O2 requirement.    Review of Systems: As mentioned in the history of present illness. All other systems reviewed and are negative.   Hospital Course:   Brief Narrative:   64 y.o. female with medical history significant of chronic respiratory failure, COPD, CKD, restless leg, anxiety admitted on 11/28/2024 please with acute on chronic hypoxic respiratory failure due to COPD exacerbation     -Assessment and Plan: 1)Acute COPD Exacerbation--CTA chest without pneumonia or PE, extensive emphysema noted -- No Leukocytosis - COVID, flu and RSV negative -- Treated with bronchodilators, mucolytics, iv Solu-Medrol  and azithromycin  --  Overall much improved from a respiratory standpoint -Oxygen  requirement is back to baseline SpO2 97% on 2L at rest ,. During ambulation SpO2 95% on 2L .  -- Patient with tachycardia with activity, no dizziness or unsteady gait -BP too soft to allow for rate control agents -Continue midodrine    2)Acute on Chronic Hypoxic Respiratory Failure--due to #1 above -at baseline patient uses 2 L of oxygen  cannula mostly as needed -Overall much improved from a respiratory standpoint -Oxygen  requirement is back to baseline SpO2 97% on 2L at rest ,. During ambulation SpO2 95% on 2L .    3)Depression/Anxiety--- continue Cymbalta  daily, Seroquel  nightly, and Xanax  as needed   4)RLS--- continue Requip    5) tobacco abuse--- Smoking cessation counseling for 4 minutes today,  Give Nicotine  patch I have discussed tobacco cessation with the patient.  I have counseled the patient regarding the negative impacts of continued tobacco use including but not limited to lung cancer, COPD, and cardiovascular disease.  I have discussed alternatives to tobacco and modalities that may help facilitate tobacco cessation including but not limited to biofeedback, hypnosis, and medications.  Total time spent with tobacco counseling was 4 minutes.   Disposition: The patient is from: Home              Anticipated d/c is to: Home  Discharge Condition: Stable, requiring 2 L of oxygen  via nasal cannula  Follow UP   Follow-up Information     Jolinda Potter M, DO. Schedule an appointment as soon as possible for a visit in 1 week(s).   Specialty: Family Medicine Contact information: [address] [place] [address] [phone]                Diet and Activity recommendation:  As advised  Discharge Instructions    Discharge Instructions     Call MD for:  difficulty breathing, headache or visual disturbances   Complete by: As directed    Call MD for:  persistant dizziness or light-headedness   Complete  by: As directed    Call MD for:  persistant nausea and vomiting   Complete by: As directed    Call MD for:  temperature >100.4   Complete by: As directed    Diet - low sodium heart healthy   Complete by: As directed    Discharge instructions   Complete by: As directed    1)you need oxygen  at home at 2 L via nasal cannula continuously while awake and while asleep--- smoking or having open fires around oxygen  can cause fire, significant injury and death  2)Avoid ibuprofen /Advil /Aleve /Motrin Josefine Powders/Naproxen /BC powders/Meloxicam /Diclofenac/Indomethacin and other Nonsteroidal anti-inflammatory medications as these will make you more likely to bleed and can cause stomach ulcers, can also cause Kidney problems.   3) please  note that there has been changes to your medications--- please take medications as prescribed  4) follow-up with your primary care physician in 1 to 2 weeks for recheck and reevaluation   Increase activity slowly   Complete by: As directed        Discharge Medications     Allergies as of 11/30/2024       Reactions   Doxycycline  Other (See Comments)   Chest pain        Medication List     STOP taking these medications    azithromycin  250 MG tablet Commonly known as: ZITHROMAX    cefdinir  300 MG capsule Commonly known as: OMNICEF        TAKE these medications    rOPINIRole  3 MG tablet Commonly known as: REQUIP  Take 1 tablet (3 mg total) by mouth at bedtime. The timing of this medication is very important.   acetaminophen  325 MG tablet Commonly known as: TYLENOL  Take 2 tablets (650 mg total) by mouth every 6 (six) hours as needed for mild pain (pain score 1-3) or headache. What changed:  medication strength how much to take reasons to take this   albuterol  108 (90 Base) MCG/ACT inhaler Commonly known as: VENTOLIN  HFA Inhale 2 puffs into the lungs every 4 (four) hours as needed for wheezing or shortness of breath. What changed: See the new  instructions.   alendronate  70 MG tablet Commonly known as: FOSAMAX  TAKE 1 TABLET EVERY 7 DAYS WITH A FULL GLASS OF WATER ON AN EMPTY STOMACH   ALPRAZolam  0.5 MG tablet Commonly known as: XANAX  Take 1 tablet by mouth 4 times daily   aspirin  EC 81 MG tablet Take 1 tablet (81 mg total) by mouth daily with breakfast. What changed: when to take this   atorvastatin  40 MG tablet Commonly known as: LIPITOR Do not restart until ok by primary care provider   Breztri  Aerosphere 160-9-4.8 MCG/ACT Aero inhaler Generic drug: budesonide -glycopyrrolate -formoterol  Inhale 2 puffs into the lungs 2 (two) times daily.   cefUROXime  500 MG tablet Commonly known as: CEFTIN  Take 1 tablet (500 mg total) by mouth 2 (two) times daily with a meal for 5 days.   cetirizine  10 MG tablet Commonly known as: ZYRTEC  Take 10 mg by mouth daily.   DULoxetine  60 MG capsule Commonly known as: CYMBALTA  Take 2 capsules (120 mg total) by mouth daily.   guaiFENesin  600 MG 12 hr tablet Commonly known as: Mucinex  Take 1 tablet (600 mg total) by mouth 2 (two) times daily.   ipratropium-albuterol  0.5-2.5 (3) MG/3ML Soln Commonly known as: DUONEB INHALE THE CONTENTS OF 1 VIAL VIA NEBULIZER EVERY 6 HOURS AS NEEDED   midodrine  10 MG tablet Commonly known as: PROAMATINE  Take 1 tablet (10 mg total) by mouth 3 (three) times daily with meals. What changed: Another medication with the same name was removed. Continue taking this medication, and follow the directions you see here.   Nebulizer/Tubing/Mouthpiece Kit 1 Units by Does not apply route 4 (four) times daily. GIVE face mask and tubing for nebulizer. J44.1   nicotine  21 mg/24hr patch Commonly known as: NICODERM CQ  - dosed in mg/24 hours Place 1 patch (21 mg total) onto the skin daily. Start taking on: December 01, 2024   nitroGLYCERIN  0.4 MG SL tablet Commonly known as: NITROSTAT  Place 1 tablet (0.4 mg total) under the tongue every 5 (five) minutes x 3 doses as  needed for chest pain (if o relief after 2nd dose, proceed to the ED for an evaluation  or call 911).   predniSONE  20 MG tablet Commonly known as: DELTASONE  Take 2 tablets (40 mg total) by mouth daily with breakfast for 5 days. What changed:  medication strength how much to take how to take this when to take this additional instructions Another medication with the same name was removed. Continue taking this medication, and follow the directions you see here.   QUEtiapine  200 MG tablet Commonly known as: SEROQUEL  Take 1 tablet (200 mg total) by mouth at bedtime.   Vitamin D3 Maximum Strength 125 MCG (5000 UT) capsule Generic drug: Cholecalciferol Take 5,000 Units by mouth daily.        Major procedures and Radiology Reports - PLEASE review detailed and final reports for all details, in brief -   CT Angio Chest PE W/Cm &/Or Wo Cm Result Date: 11/28/2024 CLINICAL DATA:  Shortness of breath and headache. EXAM: CT ANGIOGRAPHY CHEST WITH CONTRAST TECHNIQUE: Multidetector CT imaging of the chest was performed using the standard protocol during bolus administration of intravenous contrast. Multiplanar CT image reconstructions and MIPs were obtained to evaluate the vascular anatomy. RADIATION DOSE REDUCTION: This exam was performed according to the departmental dose-optimization program which includes automated exposure control, adjustment of the mA and/or kV according to patient size and/or use of iterative reconstruction technique. CONTRAST:  75mL OMNIPAQUE  IOHEXOL  350 MG/ML SOLN COMPARISON:  None Available. FINDINGS: Cardiovascular: Satisfactory opacification of the pulmonary arteries to the segmental level. No evidence of pulmonary embolism. Normal heart size. No pericardial effusion. Mediastinum/Nodes: No enlarged mediastinal, hilar, or axillary lymph nodes. Thyroid  gland, trachea, and esophagus demonstrate no significant findings. Lungs/Pleura: There is extensive emphysematous lung disease  with a 3 mm calcified pulmonary nodule seen within the posterolateral aspect of the left upper lobe (axial CT image 26, CT series 11). Additional punctate calcified lung nodule is seen within the anterior aspect of the left upper lobe (axial CT image 42, CT series 11). No acute infiltrate, pleural effusion or pneumothorax is identified Upper Abdomen: No acute abnormality. Musculoskeletal: No chest wall abnormality. No acute or significant osseous findings. Review of the MIP images confirms the above findings. IMPRESSION: 1. No evidence of pulmonary embolism or other acute intrathoracic process. 2. Extensive emphysematous lung disease. 3. Evidence of prior granulomatous disease. Electronically Signed   By: Suzen Dials M.D.   On: 11/28/2024 18:08   CT Head Wo Contrast Result Date: 11/28/2024 EXAM: CT HEAD WITHOUT CONTRAST 11/28/2024 05:56:05 PM TECHNIQUE: CT of the head was performed without the administration of intravenous contrast. Automated exposure control, iterative reconstruction, and/or weight based adjustment of the mA/kV was utilized to reduce the radiation dose to as low as reasonably achievable. COMPARISON: CT head 11/20/2015. CLINICAL HISTORY: Headache, increasing frequency or severity. FINDINGS: BRAIN AND VENTRICLES: There is no evidence of an acute infarct, intracranial hemorrhage, mass, midline shift, hydrocephalus, or extra-axial fluid collection. Cerebral volume is within normal limits for age. Calcified atherosclerosis at the skull base. ORBITS: No acute abnormality. SINUSES: No acute abnormality. SOFT TISSUES AND SKULL: No acute soft tissue abnormality. No skull fracture. IMPRESSION: 1. Negative head CT. Electronically signed by: Dasie Hamburg MD MD 11/28/2024 06:00 PM EST RP Workstation: [id number]   DG Chest Portable 1 View Result Date: 11/28/2024 CLINICAL DATA:  Short of breath, COPD EXAM: PORTABLE CHEST 1 VIEW COMPARISON:  11/24/2024 FINDINGS: Single frontal view of the chest  demonstrates an unremarkable cardiac silhouette. Background emphysema without acute airspace disease, effusion, or pneumothorax. No acute bony abnormalities. IMPRESSION: 1. Stable emphysema.  No  acute airspace disease. Electronically Signed   By: Ozell Daring M.D.   On: 11/28/2024 17:03   DG Chest 1 View Result Date: 11/24/2024 EXAM: 1 VIEW(S) XRAY OF THE CHEST 11/24/2024 10:36:00 PM COMPARISON: 10/06/2024 CLINICAL HISTORY: sob FINDINGS: LUNGS AND PLEURA: Hyperinflation with coarsened interstitial markings compatible with emphysema. No pleural effusion. No pneumothorax. HEART AND MEDIASTINUM: No acute abnormality of the cardiac and mediastinal silhouettes. BONES AND SOFT TISSUES: Old healed left rib fracture. IMPRESSION: 1. No acute findings. 2. Hyperinflation with coarsened interstitial markings compatible with emphysema. Electronically signed by: Greig Pique MD 11/24/2024 10:41 PM EST RP Workstation: [id number]    Micro Results   Recent Results (from the past 240 hours)  Culture, blood (routine x 2)     Status: None (Preliminary result)   Collection Time: 11/28/24  4:56 PM   Specimen: BLOOD  Result Value Ref Range Status   Specimen Description BLOOD LEFT ANTECUBITAL  Final   Special Requests   Final    Blood Culture adequate volume BOTTLES DRAWN AEROBIC AND ANAEROBIC   Culture   Final    NO GROWTH 2 DAYS Performed at [facility], [address], [place], [place] [address]    Report Status PENDING  Incomplete  Culture, blood (routine x 2)     Status: None (Preliminary result)   Collection Time: 11/28/24  4:56 PM   Specimen: BLOOD  Result Value Ref Range Status   Specimen Description BLOOD BLOOD RIGHT ARM  Final   Special Requests   Final    BOTTLES DRAWN AEROBIC AND ANAEROBIC Blood Culture adequate volume   Culture   Final    NO GROWTH 2 DAYS Performed at [facility], [address], [place], [place] [address]    Report Status PENDING  Incomplete  Resp panel by RT-PCR (RSV, Flu  A&B, Covid) Anterior Nasal Swab     Status: None   Collection Time: 11/28/24  7:04 PM   Specimen: Anterior Nasal Swab  Result Value Ref Range Status   SARS Coronavirus 2 by RT PCR NEGATIVE NEGATIVE Final    Comment: (NOTE) SARS-CoV-2 target nucleic acids are NOT DETECTED.  The SARS-CoV-2 RNA is generally detectable in upper respiratory specimens during the acute phase of infection. The lowest concentration of SARS-CoV-2 viral copies this assay can detect is 138 copies/mL. A negative result does not preclude SARS-Cov-2 infection and should not be used as the sole basis for treatment or other patient management decisions. A negative result may occur with  improper specimen collection/handling, submission of specimen other than nasopharyngeal swab, presence of viral mutation(s) within the areas targeted by this assay, and inadequate number of viral copies(<138 copies/mL). A negative result must be combined with clinical observations, patient history, and epidemiological information. The expected result is Negative.  Fact Sheet for Patients:  bloggercourse.com  Fact Sheet for Healthcare Providers:  seriousbroker.it  This test is no t yet approved or cleared by the [place]  FDA and  has been authorized for detection and/or diagnosis of SARS-CoV-2 by FDA under an Emergency Use Authorization (EUA). This EUA will remain  in effect (meaning this test can be used) for the duration of the COVID-19 declaration under Section 564(b)(1) of the Act, 21 U.S.C.section 360bbb-3(b)(1), unless the authorization is terminated  or revoked sooner.       Influenza A by PCR NEGATIVE NEGATIVE Final   Influenza B by PCR NEGATIVE NEGATIVE Final    Comment: (NOTE) The Xpert Xpress SARS-CoV-2/FLU/RSV plus assay is intended as an  aid in the diagnosis of influenza from Nasopharyngeal swab specimens and should not be used as a sole basis for treatment.  Nasal washings and aspirates are unacceptable for Xpert Xpress SARS-CoV-2/FLU/RSV testing.  Fact Sheet for Patients: bloggercourse.com  Fact Sheet for Healthcare Providers: seriousbroker.it  This test is not yet approved or cleared by the [place]  FDA and has been authorized for detection and/or diagnosis of SARS-CoV-2 by FDA under an Emergency Use Authorization (EUA). This EUA will remain in effect (meaning this test can be used) for the duration of the COVID-19 declaration under Section 564(b)(1) of the Act, 21 U.S.C. section 360bbb-3(b)(1), unless the authorization is terminated or revoked.     Resp Syncytial Virus by PCR NEGATIVE NEGATIVE Final    Comment: (NOTE) Fact Sheet for Patients: bloggercourse.com  Fact Sheet for Healthcare Providers: seriousbroker.it  This test is not yet approved or cleared by the [place]  FDA and has been authorized for detection and/or diagnosis of SARS-CoV-2 by FDA under an Emergency Use Authorization (EUA). This EUA will remain in effect (meaning this test can be used) for the duration of the COVID-19 declaration under Section 564(b)(1) of the Act, 21 U.S.C. section 360bbb-3(b)(1), unless the authorization is terminated or revoked.  Performed at [facility], [address] [place], [place] [address]     Today   Subjective    Tori Dattilio today has no new complaints - Cough and dyspnea improved significantly No fever  Or chills  No Nausea, Vomiting or Diarrhea Overall much improved from a respiratory standpoint -Oxygen  requirement is back to baseline SpO2 97% on 2L at rest ,. During ambulation SpO2 95% on 2L .        Patient has been seen and examined prior to discharge   Objective   Blood pressure 105/68, pulse 84, temperature 98.3 F (36.8 C), temperature source Oral, resp. rate 18, height 5' 4 (1.626 m), weight  54.2 kg, SpO2 98%.   Intake/Output Summary (Last 24 hours) at 11/30/2024 1048 Last data filed at 11/30/2024 0545 Gross per 24 hour  Intake 300 ml  Output --  Net 300 ml   Exam Gen:- Awake Alert, no acute distress, no conversational dyspnea HEENT:- [place].AT, No sclera icterus Nose- [place] 2L/min Neck-Supple Neck,No JVD,.  Lungs-improved air movement, no wheezing  CV- S1, S2 normal, regular Abd-  +ve B.Sounds, Abd Soft, No tenderness,    Extremity/Skin:- No  edema,   good pulses Psych-affect is appropriate, oriented x3 Neuro-no new focal deficits, no tremors    Data Review   CBC w Diff:  Lab Results  Component Value Date   WBC 8.6 11/29/2024   HGB 12.4 11/29/2024   HGB 14.3 08/28/2024   HCT 37.4 11/29/2024   HCT 42.7 08/28/2024   PLT 277 11/29/2024   PLT 328 08/28/2024   LYMPHOPCT 10 06/22/2024   MONOPCT 6 06/22/2024   EOSPCT 0 06/22/2024   BASOPCT 0 06/22/2024   CMP:  Lab Results  Component Value Date   NA 140 11/29/2024   NA 140 05/01/2024   K 4.4 11/29/2024   CL 104 11/29/2024   CO2 34 (H) 11/29/2024   BUN 11 11/29/2024   BUN 13 05/01/2024   CREATININE 0.65 11/29/2024   PROT 7.2 11/28/2024   PROT 6.7 04/28/2024   ALBUMIN 4.4 11/28/2024   ALBUMIN 4.3 04/28/2024   BILITOT 0.3 11/28/2024   BILITOT 0.3 04/28/2024   ALKPHOS 65 11/28/2024   AST 23 11/28/2024   ALT 18 11/28/2024   Total  Discharge time is about 33 minutes  Rendall Carwin M.D on 11/30/2024 at 10:48 AM  Go to [email or web address] -  for contact info  [facility] - Office  [phone]   "

## 2024-11-30 NOTE — Discharge Instructions (Signed)
 1)you need oxygen  at home at 2 L via nasal cannula continuously while awake and while asleep--- smoking or having open fires around oxygen  can cause fire, significant injury and death  2)Avoid ibuprofen /Advil /Aleve /Motrin Josefine Powders/Naproxen /BC powders/Meloxicam /Diclofenac/Indomethacin and other Nonsteroidal anti-inflammatory medications as these will make you more likely to bleed and can cause stomach ulcers, can also cause Kidney problems.   3) please note that there has been changes to your medications--- please take medications as prescribed  4) follow-up with your primary care physician in 1 to 2 weeks for recheck and reevaluation

## 2024-11-30 NOTE — TOC CM/SW Note (Signed)
 Transition of Care [facility]) - Inpatient Brief Assessment   Patient Details  Name: Alicia Sanchez MRN: [id number] Date of Birth: 12-24-1960  Transition of Care [facility]) CM/SW Contact:    Lucie Lunger, LCSWA Phone Number: 11/30/2024, 8:52 AM   Clinical Narrative: Transition of Care Department [facility]) has reviewed patient and no TOC needs have been identified at this time. We will continue to monitor patient advancement through interdiciplinary progression rounds. If new patient transition needs arise, please place a TOC consult.  Transition of Care Asessment: Insurance and Status: Insurance coverage has been reviewed Patient has primary care physician: Yes Home environment has been reviewed: From home Prior level of function:: Independent Prior/Current Home Services: No current home services Social Drivers of Health Review: SDOH reviewed no interventions necessary Readmission risk has been reviewed: Yes Transition of care needs: no transition of care needs at this time

## 2024-11-30 NOTE — Plan of Care (Signed)
  Problem: Education: Goal: Knowledge of General Education information will improve Description: Including pain rating scale, medication(s)/side effects and non-pharmacologic comfort measures Outcome: Progressing   Problem: Health Behavior/Discharge Planning: Goal: Ability to manage health-related needs will improve Outcome: Progressing   Problem: Clinical Measurements: Goal: Ability to maintain clinical measurements within normal limits will improve Outcome: Progressing Goal: Will remain free from infection Outcome: Progressing Goal: Diagnostic test results will improve Outcome: Progressing Goal: Respiratory complications will improve Outcome: Progressing Goal: Cardiovascular complication will be avoided Outcome: Progressing   Problem: Activity: Goal: Risk for activity intolerance will decrease Outcome: Progressing   Problem: Nutrition: Goal: Adequate nutrition will be maintained Outcome: Progressing   Problem: Coping: Goal: Level of anxiety will decrease Outcome: Progressing   Problem: Elimination: Goal: Will not experience complications related to bowel motility Outcome: Progressing Goal: Will not experience complications related to urinary retention Outcome: Progressing   Problem: Pain Managment: Goal: General experience of comfort will improve and/or be controlled Outcome: Progressing   Problem: Safety: Goal: Ability to remain free from injury will improve Outcome: Progressing   Problem: Skin Integrity: Goal: Risk for impaired skin integrity will decrease Outcome: Progressing   Problem: Education: Goal: Knowledge of disease or condition will improve Outcome: Progressing Goal: Knowledge of the prescribed therapeutic regimen will improve Outcome: Progressing Goal: Individualized Educational Video(s) Outcome: Progressing   Problem: Activity: Goal: Ability to tolerate increased activity will improve Outcome: Progressing Goal: Will verbalize the  importance of balancing activity with adequate rest periods Outcome: Progressing   Problem: Respiratory: Goal: Ability to maintain a clear airway will improve Outcome: Progressing Goal: Levels of oxygenation will improve Outcome: Progressing Goal: Ability to maintain adequate ventilation will improve Outcome: Progressing   Problem: Education: Goal: Knowledge of General Education information will improve Description: Including pain rating scale, medication(s)/side effects and non-pharmacologic comfort measures Outcome: Progressing   Problem: Health Behavior/Discharge Planning: Goal: Ability to manage health-related needs will improve Outcome: Progressing   Problem: Clinical Measurements: Goal: Ability to maintain clinical measurements within normal limits will improve Outcome: Progressing Goal: Will remain free from infection Outcome: Progressing Goal: Diagnostic test results will improve Outcome: Progressing Goal: Respiratory complications will improve Outcome: Progressing Goal: Cardiovascular complication will be avoided Outcome: Progressing   Problem: Activity: Goal: Risk for activity intolerance will decrease Outcome: Progressing   Problem: Nutrition: Goal: Adequate nutrition will be maintained Outcome: Progressing   Problem: Coping: Goal: Level of anxiety will decrease Outcome: Progressing   Problem: Elimination: Goal: Will not experience complications related to bowel motility Outcome: Progressing Goal: Will not experience complications related to urinary retention Outcome: Progressing   Problem: Pain Managment: Goal: General experience of comfort will improve and/or be controlled Outcome: Progressing   Problem: Safety: Goal: Ability to remain free from injury will improve Outcome: Progressing   Problem: Skin Integrity: Goal: Risk for impaired skin integrity will decrease Outcome: Progressing   Problem: Education: Goal: Knowledge of disease or  condition will improve Outcome: Progressing Goal: Knowledge of the prescribed therapeutic regimen will improve Outcome: Progressing Goal: Individualized Educational Video(s) Outcome: Progressing   Problem: Activity: Goal: Ability to tolerate increased activity will improve Outcome: Progressing Goal: Will verbalize the importance of balancing activity with adequate rest periods Outcome: Progressing   Problem: Respiratory: Goal: Ability to maintain a clear airway will improve Outcome: Progressing Goal: Levels of oxygenation will improve Outcome: Progressing Goal: Ability to maintain adequate ventilation will improve Outcome: Progressing

## 2024-12-01 ENCOUNTER — Telehealth: Payer: Self-pay

## 2024-12-01 NOTE — Transitions of Care (Post Inpatient/ED Visit) (Signed)
" ° °  12/01/2024  Name: Alicia Sanchez MRN: [id number] DOB: 04-Oct-1961  Today's TOC FU Call Status: Today's TOC FU Call Status:: Unsuccessful Call (1st Attempt) Unsuccessful Call (1st Attempt) Date: 12/01/24  Attempted to reach the patient regarding the most recent Inpatient/ED visit.  Left a HIPAA approved voicemail message to phone number provided in demographics per DPR.    Follow Up Plan: Additional outreach attempts will be made to reach the patient to complete the Transitions of Care (Post Inpatient/ED visit) call.   Richerd Fish, RN, BSN, CCM [facility], [place] [facility] Management Coordinator Direct Dial: [phone]        "

## 2024-12-02 ENCOUNTER — Telehealth: Payer: Self-pay

## 2024-12-02 ENCOUNTER — Telehealth: Payer: Self-pay | Admitting: Family Medicine

## 2024-12-02 NOTE — Telephone Encounter (Signed)
 Appropriate appointment scheduled.

## 2024-12-02 NOTE — Transitions of Care (Post Inpatient/ED Visit) (Signed)
 "  12/02/2024  Name: Alicia Sanchez MRN: [id number] DOB: [birth year]  Today's TOC FU Call Status: Today's TOC FU Call Status:: Successful TOC FU Call Completed TOC FU Call Complete Date: 12/02/24  Patient's Name and Date of Birth confirmed. Name, DOB  Transition Care Management Follow-up Telephone Call Date of Discharge: 11/30/24 [facility] Type of Discharge: Inpatient Admission Primary Inpatient Discharge Diagnosis:: Acute on chronic respiratory failure with hypoxia How have you been since you were released from the hospital?: Better Any questions or concerns?: No  Items Reviewed: Did you receive and understand the discharge instructions provided?: Yes Medications obtained,verified, and reconciled?: Yes (Medications Reviewed) Any new allergies since your discharge?: No Dietary orders reviewed?: Yes Type of Diet Ordered:: Patient instructed on AVS for low sodium Heart healthy diet Do you have support at home?: Yes People in Home [RPT]: alone Name of Support/Comfort Primary Source: Dtr GLENWO[facility] Shields  Medications Reviewed Today: Medications Reviewed Today     Reviewed by Eilleen Richerd GRADE, RN (Registered Nurse) on 12/02/24 at 1148  Med List Status: <None>   Medication Order Taking? Sig Documenting Provider Last Dose Status Informant  acetaminophen  (TYLENOL ) 325 MG tablet [id number] Yes Take 2 tablets (650 mg total) by mouth every 6 (six) hours as needed for mild pain (pain score 1-3) or headache. Pearlean Manus, MD  Active   albuterol  (VENTOLIN  HFA) 108 (90 Base) MCG/ACT inhaler [id number] Yes Inhale 2 puffs into the lungs every 4 (four) hours as needed for wheezing or shortness of breath. Pearlean Manus, MD  Active   alendronate  (FOSAMAX ) 70 MG tablet [id number] Yes TAKE 1 TABLET EVERY 7 DAYS WITH A FULL GLASS OF WATER ON AN EMPTY STOMACH Jolinda Potter M, DO  Active Self, Pharmacy Records  ALPRAZolam  (XANAX ) 0.5 MG tablet [id number] Yes Take 1 tablet by  mouth 4 times daily Mozingo, Regina Nattalie, NP  Active   aspirin  EC 81 MG tablet [id number] Yes Take 1 tablet (81 mg total) by mouth daily with breakfast. Pearlean Manus, MD  Active   atorvastatin  (LIPITOR) 40 MG tablet [id number] Yes Do not restart until ok by primary care provider Pearlean Manus, MD  Active   budesonide -glycopyrrolate -formoterol  (BREZTRI  AEROSPHERE) 160-9-4.8 MCG/ACT AERO inhaler [id number] Yes Inhale 2 puffs into the lungs 2 (two) times daily. Pearlean Manus, MD  Active   cefUROXime  (CEFTIN ) 500 MG tablet [id number] Yes Take 1 tablet (500 mg total) by mouth 2 (two) times daily with a meal for 5 days. Pearlean Manus, MD  Active   cetirizine  (ZYRTEC ) 10 MG tablet [id number] Yes Take 10 mg by mouth daily. [provider]  Active Self, Pharmacy Records  Cholecalciferol (VITAMIN D3 MAXIMUM STRENGTH) 125 MCG (5000 UT) capsule [id number] Yes Take 5,000 Units by mouth daily. [provider]  Active Self, Pharmacy Records  DULoxetine  (CYMBALTA ) 60 MG capsule [id number] Yes Take 2 capsules (120 mg total) by mouth daily. Pearlean Manus, MD  Active   guaiFENesin  (MUCINEX ) 600 MG 12 hr tablet [id number] Yes Take 1 tablet (600 mg total) by mouth 2 (two) times daily. Pearlean Manus, MD  Active   ipratropium-albuterol  (DUONEB) 0.5-2.5 (3) MG/3ML SOLN [id number] Yes INHALE THE CONTENTS OF 1 VIAL VIA NEBULIZER EVERY 6 HOURS AS NEEDED Emokpae, Courage, MD  Active   midodrine  (PROAMATINE ) 10 MG tablet [id number] Yes Take 1 tablet (10 mg total) by mouth 3 (three) times daily with meals. Pearlean Manus, MD  Active   nicotine  (NICODERM CQ  - DOSED IN MG/24 HOURS) 21 mg/24hr patch  [id number] Yes Place 1 patch (21 mg total) onto the skin daily. Pearlean Manus, MD  Active   nitroGLYCERIN  (NITROSTAT ) 0.4 MG SL tablet [id number] Yes Place 1 tablet (0.4 mg total) under the tongue every 5 (five) minutes x 3 doses as needed for chest pain (if o relief after 2nd dose, proceed to the ED for an  evaluation or call 911). Miriam Norris, NP  Active Self, Pharmacy Records  predniSONE  (DELTASONE ) 20 MG tablet [id number] Yes Take 2 tablets (40 mg total) by mouth daily with breakfast for 5 days. Pearlean Manus, MD  Active   QUEtiapine  (SEROQUEL ) 200 MG tablet [id number] Yes Take 1 tablet (200 mg total) by mouth at bedtime. Pearlean Manus, MD  Active   Respiratory Therapy Supplies (NEBULIZER/TUBING/MOUTHPIECE) KIT [id number] Yes 1 Units by Does not apply route 4 (four) times daily. GIVE face mask and tubing for nebulizer. J44.1 Jolinda Norene HERO, DO  Active Self, Pharmacy Records  rOPINIRole  (REQUIP ) 3 MG tablet [id number] Yes Take 1 tablet (3 mg total) by mouth at bedtime. Jolinda Norene HERO, DO  Active Self, Pharmacy Records            Home Care and Equipment/Supplies: Were Home Health Services Ordered?: No Any new equipment or medical supplies ordered?: No  Functional Questionnaire: Do you need assistance with bathing/showering or dressing?: No Do you need assistance with meal preparation?: No Do you need assistance with eating?: No Do you have difficulty maintaining continence: No Do you need assistance with getting out of bed/getting out of a chair/moving?: No Do you have difficulty managing or taking your medications?: No  Follow up appointments reviewed: PCP Follow-up appointment confirmed?: Yes ([employer] for PCP follow up) Date of PCP follow-up appointment?: 12/09/24 Follow-up Provider: [facility] hospital follow up with Ann[place], [facility] Follow-up appointment confirmed?: Yes Date of Specialist follow-up appointment?: 12/08/24 Follow-Up Specialty Provider:: Cardiology Do you need transportation to your follow-up appointment?: No Do you understand care options if your condition(s) worsen?: Yes-patient verbalized understanding  SDOH Interventions Today    Flowsheet Row Most Recent Value  SDOH Interventions   Food Insecurity Interventions Intervention Not  Indicated  Housing Interventions Intervention Not Indicated  Transportation Interventions Intervention Not Indicated  Utilities Interventions Intervention Not Indicated     Goals Addressed             This Visit's Progress    VBCI Transitions of Care (TOC) Care Plan       Problems:  Recent Hospitalization for treatment of COPD No Hospital Follow Up Provider appointment HFU made with PCP office for 12/10/24   Goal:  Over the next 30 days, the patient will not experience hospital readmission  Interventions:  Transitions of Care: Doctor Visits  - discussed the importance of doctor visits Reviewed AVS instructions for discharge:  Discharge instructions 1)you need oxygen  at home at 2 L via nasal cannula continuously while awake and while asleep--- smoking or having open fires around oxygen  can cause fire, significant injury and death 2)Avoid ibuprofen /Advil /Aleve /Motrin Josefine Powders/Naproxen /BC powders/Meloxicam /Diclofenac/ Indomethacin and other Nonsteroidal anti-inflammatory medications as these will make you more likely to bleed and can cause stomach ulcers, can also cause Kidney problems. 3) please note that there has been changes to your medications--- please take medications as prescribed 4) follow-up with your primary care physician in 1 to 2 weeks for recheck and reevaluation  COPD Interventions: Advised patient to track and manage COPD triggers Assessed social determinant of health barriers Discussed the importance of adequate rest and management  of fatigue with COPD Provided education about and advised patient to utilize infection prevention strategies to reduce risk of respiratory infection Screening for signs and symptoms of depression related to chronic disease state  Use of home oxygen  -patient is on continuous oxygen  now, thinks she has [employer] for supplies, received extra tubing and supplies for nebulizer from the hospital.   Patient Self Care Activities:  Attend all  scheduled provider appointments Call pharmacy for medication refills 3-7 days in advance of running out of medications Call provider office for new concerns or questions  Notify RN Care Manager of TOC call rescheduling needs Participate in Transition of Care Program/Attend TOC scheduled calls Take medications as prescribed    Plan:  The patient has been provided with contact information for the care management team and has been advised to call with any health related questions or concerns.  12/02/24 Discussed and offered 30 day TOC program.  Patient agrees to weekly follow up TOC calls.  The patient has been provided with contact information for the care management team and has been advised to call with any health -related questions or concerns.  The patient verbalized understanding with current plan of care.   [facility] scheduled, patient uses MyChart for appointment reminders.         Richerd Fish, RN, BSN, CCM [facility], [facility] Management Coordinator Direct Dial: [phone]        "

## 2024-12-02 NOTE — Patient Instructions (Addendum)
 Visit Information  Thank you for taking time to visit with me today. Please don't hesitate to contact me if I can be of assistance to you before our next scheduled telephone appointment.  Our next appointment is by telephone on 12/11/24 at 1300  Following is a copy of your care plan:   Goals Addressed             This Visit's Progress    VBCI Transitions of Care (TOC) Care Plan       Problems:  Recent Hospitalization for treatment of COPD No Hospital Follow Up Provider appointment HFU made with PCP office for 12/10/24   Goal:  Over the next 30 days, the patient will not experience hospital readmission  Interventions:  Transitions of Care: Doctor Visits  - discussed the importance of doctor visits Reviewed AVS instructions for discharge:  Discharge instructions 1)you need oxygen  at home at 2 L via nasal cannula continuously while awake and while asleep--- smoking or having open fires around oxygen  can cause fire, significant injury and death 2)Avoid ibuprofen /Advil /Aleve /Motrin /Goody Powders/Naproxen /BC powders/Meloxicam /Diclofenac/ Indomethacin and other Nonsteroidal anti-inflammatory medications as these will make you more likely to bleed and can cause stomach ulcers, can also cause Kidney problems. 3) please note that there has been changes to your medications--- please take medications as prescribed 4) follow-up with your primary care physician in 1 to 2 weeks for recheck and reevaluation  COPD Interventions: Advised patient to track and manage COPD triggers Assessed social determinant of health barriers Discussed the importance of adequate rest and management of fatigue with COPD Provided education about and advised patient to utilize infection prevention strategies to reduce risk of respiratory infection Screening for signs and symptoms of depression related to chronic disease state  Use of home oxygen  -patient is on continuous oxygen  now, thinks she has [employer] for supplies,  received extra tubing and supplies for nebulizer from the hospital.   Patient Self Care Activities:  Attend all scheduled provider appointments Call pharmacy for medication refills 3-7 days in advance of running out of medications Call provider office for new concerns or questions  Notify RN Care Manager of TOC call rescheduling needs Participate in Transition of Care Program/Attend TOC scheduled calls Take medications as prescribed    Plan:  The patient has been provided with contact information for the care management team and has been advised to call with any health related questions or concerns.  12/02/24 Discussed and offered 30 day TOC program.  Patient agrees to weekly follow up TOC calls.  The patient has been provided with contact information for the care management team and has been advised to call with any health -related questions or concerns.  The patient verbalized understanding with current plan of care.   Appointment for next week with [facility] RN scheduled, patient uses MyChart for appointment reminders.        Patient verbalizes understanding of instructions and care plan provided today and agrees to view in MyChart. Active MyChart status and patient understanding of how to access instructions and care plan via MyChart confirmed with patient.     The patient has been provided with contact information for the care management team and has been advised to call with any health related questions or concerns.   Please call the care guide team at [phone] if you need to cancel or reschedule your appointment.   Please call the Suicide and Crisis Lifeline: 988 call the [place]  National Suicide Prevention Lifeline: 217 025 9414 or TTY: 626-035-4786 TTY 802-617-4477)  to talk to a trained counselor call 1-800-273-TALK (toll free, 24 hour hotline) call the [facility]: 534 016 6723 call 911 if you are experiencing a Mental Health or Behavioral Health Crisis or need  someone to talk to.  Richerd Fish, RN, BSN, CCM [facility], [facility] Management Coordinator Direct Dial: [phone]

## 2024-12-02 NOTE — Telephone Encounter (Unsigned)
 Copied from CRM [id number]. Topic: General - Other >> Dec 02, 2024  1:18 PM Deaijah H wrote: Reason for CRM: Patient is requesting Dr. Jolinda to order her a shower chair

## 2024-12-03 ENCOUNTER — Ambulatory Visit: Admitting: Family Medicine

## 2024-12-03 LAB — CULTURE, BLOOD (ROUTINE X 2)
Culture: NO GROWTH
Culture: NO GROWTH
Special Requests: ADEQUATE
Special Requests: ADEQUATE

## 2024-12-04 ENCOUNTER — Telehealth: Payer: Self-pay

## 2024-12-04 NOTE — Telephone Encounter (Signed)
 Copied from CRM [id number]. Topic: Appointments - Scheduling Inquiry for Clinic >> Dec 04, 2024  3:54 PM Alicia Sanchez wrote: Reason for CRM: Patient called.. hosp f/u past 3 weeks?  Do we have something sooner.. she rcvd vmail to reschedule

## 2024-12-08 ENCOUNTER — Encounter: Payer: Self-pay | Admitting: Nurse Practitioner

## 2024-12-08 ENCOUNTER — Telehealth: Payer: Self-pay | Admitting: Nurse Practitioner

## 2024-12-08 ENCOUNTER — Telehealth: Payer: Self-pay | Admitting: *Deleted

## 2024-12-08 ENCOUNTER — Ambulatory Visit

## 2024-12-08 ENCOUNTER — Ambulatory Visit: Attending: Nurse Practitioner | Admitting: Nurse Practitioner

## 2024-12-08 VITALS — BP 108/71 | HR 98 | Ht 64.0 in | Wt 119.0 lb

## 2024-12-08 DIAGNOSIS — Z8679 Personal history of other diseases of the circulatory system: Secondary | ICD-10-CM

## 2024-12-08 DIAGNOSIS — R002 Palpitations: Secondary | ICD-10-CM

## 2024-12-08 DIAGNOSIS — Z87898 Personal history of other specified conditions: Secondary | ICD-10-CM | POA: Diagnosis not present

## 2024-12-08 DIAGNOSIS — Z720 Tobacco use: Secondary | ICD-10-CM | POA: Diagnosis not present

## 2024-12-08 DIAGNOSIS — J449 Chronic obstructive pulmonary disease, unspecified: Secondary | ICD-10-CM | POA: Diagnosis not present

## 2024-12-08 DIAGNOSIS — R0602 Shortness of breath: Secondary | ICD-10-CM

## 2024-12-08 NOTE — Telephone Encounter (Signed)
 Checking percert on the following   7 day zio-home enroll-palps for EP

## 2024-12-08 NOTE — Progress Notes (Signed)
 "   Virtual Visit via Telephone Note   Because of Alicia Sanchez's co-morbid illnesses, she is at least at moderate risk for complications without adequate follow up.  This format is felt to be most appropriate for this patient at this time.  The patient did not have access to video technology/had technical difficulties with video requiring transitioning to audio format only (telephone).  All issues noted in this document were discussed and addressed.  No physical exam could be performed with this format.  Please refer to the patient's chart for her consent to telehealth for [facility].    Date:  12/08/2024 ID:  Alicia Sanchez, DOB 1961/05/12, MRN [id number] The patient was identified using 2 identifiers. Patient Location: Home Provider Location: Office/Clinic PCP:  Jolinda Norene HERO, DO [facility] Providers Cardiologist:  Alvan Carrier, MD Cardiology APP:  Miriam Norris, NP     Evaluation Performed:  Follow-Up Visit  Chief Complaint:  Here for follow-up   History of Present Illness:    Alicia Sanchez is a 64 y.o. female with hx of mild CAD, chest pain, syncope, palpitations, history of chest pain, COPD, shortness of breath, osteoporosis, PSVT, orthostatic syncope, lightheadedness, hypotension, anxiety/depression/panic attacks who presents today for follow-up.   09/04/2024 - She is here for follow-up today.  She states she has she has not been doing well recently.  Continues to admit to issues with her BP and does admit she has been increasing her salt intake.  She confirms to me she is taking midodrine  10 mg 3 times daily.  Does admit to stress, tearful during interview.  Mom has been dealing with cancer, now with hospice care, health has declined - going for upcoming procedure for what sounds like suprapubic catheter to be placed. Denies any chest pain, shortness of breath, palpitations, syncope, orthopnea, PND, swelling or significant weight  changes, acute bleeding, or claudication.  Since last office visit, she has had an ED visit on 11/24/2024 for COPD exacerbation and recent hospitalization this month also for COPD exacerbation.   12/08/2024 - She is here today via telephone visit. Says she has not been feeling well recently, feels as though her heart is pumping overtime, admits to fast HR at times. Now on O2 continuously wearing 3 liters per her report. Has noticed more progression and worsening with her DOE and noticed when going to bed. Denies any chest pain, syncope, presyncope, dizziness, orthopnea, PND, swelling or significant weight changes, acute bleeding, or claudication.  ROS:   Please see the history of present illness.    All other systems reviewed and are negative.   Prior CV studies:   The following studies were reviewed today:  Echo 04/2024:  1. Left ventricular ejection fraction, by estimation, is 65 to 70%. The  left ventricle has normal function. The left ventricle has no regional  wall motion abnormalities. Left ventricular diastolic parameters were  normal.   2. Right ventricular systolic function is normal. The right ventricular  size is mildly enlarged.   3. The mitral valve is normal in structure. No evidence of mitral valve  regurgitation. No evidence of mitral stenosis.   4. The aortic valve is tricuspid. Aortic valve regurgitation is not  visualized. No aortic stenosis is present.   5. The inferior vena cava is normal in size with greater than 50%  respiratory variability, suggesting right atrial pressure of 3 mmHg.   Cardiac monitor report 02/2023:   14 day monitor   Rare supraventricular  ectopy in the form of isolated PACs, couplets, triplets. 11 runs of SVT longest 20 beats   Rare ventricular ectopy in the form of isolated PVCs, couplets. Isolated 6 beat run of NSVT   Several patient triggered events but no diary of symptoms included.     Patch Wear Time:  14 days and 0 hours  (2024-03-11T14:09:55-0400 to 2024-03-25T14:09:55-0400)   Patient had a min HR of 57 bpm, max HR of 179 bpm, and avg HR of 98 bpm. Predominant underlying rhythm was Sinus Rhythm. 1 run of Ventricular Tachycardia occurred lasting 7 beats with a max rate of 152 bpm (avg 126 bpm). 11 Supraventricular Tachycardia  runs occurred, the run with the fastest interval lasting 5 beats with a max rate of 179 bpm, the longest lasting 20 beats with an avg rate of 148 bpm. Isolated SVEs were rare (<1.0%), SVE Couplets were rare (<1.0%), and SVE Triplets were rare (<1.0%).  Isolated VEs were rare (<1.0%), VE Couplets were rare (<1.0%), and no VE Triplets were present.   Echocardiogram 02/2023: 1. Left ventricular ejection fraction, by estimation, is >75%. The left  ventricle has hyperdynamic function. The left ventricle has no regional  wall motion abnormalities. There is mild left ventricular hypertrophy.  Left ventricular diastolic parameters  are consistent with Grade I diastolic dysfunction (impaired relaxation).   2. Right ventricular systolic function is normal. The right ventricular  size is normal.   3. The mitral valve is normal in structure. No evidence of mitral valve  regurgitation. No evidence of mitral stenosis.   4. The aortic valve was not well visualized. Aortic valve regurgitation  is not visualized.   5. The inferior vena cava is normal in size with greater than 50%  respiratory variability, suggesting right atrial pressure of 3 mmHg.   Comparison(s): Nuclear stress test done 08/03/21 showed an EF of 80%.  Echocardiogram done 07/11/18 showed an EF of 60-65%.  Myoview  01/2023:   The study is normal. The study is low risk.   No ST deviation was noted. The ECG was negative for ischemia.   LV perfusion is normal.  No significant myocardial perfusion defects to indicate scar or ischemia.   Left ventricular function is normal. Nuclear stress EF: 85 %.   Low risk study with no significant  myocardial perfusion defects to indicate scar or ischemia.  LVEF 85%.   Lexiscan  on August 03, 2021:   The study is low risk.   No ST deviation was noted. The ECG was negative for ischemia.   LV perfusion is abnormal. Defect 1: There is a small defect with mild reduction in uptake present in the mid inferior location(s) that is partially reversible. There is normal wall motion in the defect area. Consistent with mild ischemia.   Left ventricular function is normal. Nuclear stress EF: 80 %.   Small, partially reversible, mid inferior wall defect suggesting mild ischemic territory with vigorous LVEF 80%.  Low risk study.  Coronary CTA on February 04, 2020: IMPRESSION: 1. Coronary artery calcium  score 45 Agatston units. This places the patient in the 85th percentile for age and gender, suggesting high risk for future cardiac events.   2.  Nonobstructive mild CAD noted.  Echocardiogram on July 11, 2018: Study Conclusions   - Left ventricle: The cavity size was normal. Wall thickness was    normal. Systolic function was normal. The estimated ejection    fraction was in the range of 60% to 65%. Wall motion was normal;  there were no regional wall motion abnormalities. Left    ventricular diastolic function parameters were normal.  - Aortic valve: Mildly calcified annulus. Probably trileaflet.  - Mitral valve: Mildly calcified annulus. There was trivial    regurgitation.  - Right ventricle: The cavity size was mildly dilated.  - Right atrium: Central venous pressure (est): 3 mm Hg.  - Atrial septum: No defect or patent foramen ovale was identified.  - Tricuspid valve: There was trivial regurgitation.  - Pericardium, extracardiac: A prominent pericardial fat pad was    present.    Labs/Other Tests and Data Reviewed:    EKG:  No ECG reviewed.  Recent Labs: 04/21/2024: Magnesium  1.8 11/28/2024: ALT 18; Pro Brain Natriuretic Peptide 88.3 12/09/2024: BUN 10; Creatinine, Ser 0.76;  Hemoglobin 14.4; Platelets 396; Potassium 4.9; Sodium 142   Recent Lipid Panel Lab Results  Component Value Date/Time   CHOL 137 10/09/2023 10:18 AM   TRIG 73 10/09/2023 10:18 AM   HDL 77 10/09/2023 10:18 AM   CHOLHDL 1.8 10/09/2023 10:18 AM   LDLCALC 46 10/09/2023 10:18 AM    Wt Readings from Last 3 Encounters:  12/11/24 121 lb (54.9 kg)  12/09/24 121 lb 9.6 oz (55.2 kg)  12/08/24 119 lb (54 kg)        Objective:    Vital Signs:  BP 108/71   Pulse 98   Ht 5' 4 (1.626 m)   Wt 119 lb (54 kg)   BMI 20.43 kg/m    Due to nature of today's visit, a physical exam was unable to be performed.   ASSESSMENT & PLAN:     Hx of orthostatic syncope Etiology not clear, denies any recurrent LOC/presyncope.  Was told in the past to be orthostatic in nature.  Previous negative orthostatics on exam.  Echocardiogram and Myoview  unremarkable.  Monitor was negative for any high degree AV block or significant pauses. Denies any recent symptoms, BP well controlled. Continue midodrine . No other medication changes. Heart healthy diet and regular cardiovascular exercise encouraged.  ED precautions discussed. Discussed conservative measures.    2.  History of PSVT, palpitations Does admit to recent palpitations associated with her worsening dOE - see below. Previous monitor report revealed 1 run of VT, lasting 7 beats, 11 SVT episodes with the longest episode lasting 20 beats, average heart rate 148 bpm. Not on any beta blocker d/t hx of low BP's. Will arrange Zio monitor Continue current medication regimen.  Discussed to avoid/limit caffeine/nicotine  use-see below.  Her healthy diet and regular cardiovascular exercise encouraged.  ED precautions discussed.   3. COPD, shortness of breath, tobacco use Hx of multiple hospitalizations for COPD exacerbation. Admits to recent worsening SHOB/DOE. Will arrange Echo for further evaluation. Possibly could be r/t to progression in COPD. Continue to follow-up with  Pulmonologist. Care and ED precautions discussed.       Time:   Today, I have spent 4 minutes with the patient with telehealth technology discussing the above problems.    I spent a total duration of 30 minutes reviewing prior notes, reviewing outside records including  labs, telephone counseling of medical condition, pathophysiology, evaluation, management, and documenting the findings in the note.    Medication Adjustments/Labs and Tests Ordered: Current medicines are reviewed at length with the patient today.  Concerns regarding medicines are outlined above.   Tests Ordered: Orders Placed This Encounter  Procedures   LONG TERM MONITOR (3-14 DAYS)   ECHOCARDIOGRAM COMPLETE    Medication Changes: No orders of  the defined types were placed in this encounter.   Follow Up:  In Person in 6-8 week(s)  Signed, Almarie Crate, NP  "

## 2024-12-08 NOTE — Patient Instructions (Signed)
 Medication Instructions:   Your physician recommends that you continue on your current medications as directed. Please refer to the Current Medication list given to you today.   Labwork: None today  Testing/Procedures: Your physician has requested that you have an echocardiogram. Echocardiography is a painless test that uses sound waves to create images of your heart. It provides your doctor with information about the size and shape of your heart and how well your hearts chambers and valves are working. This procedure takes approximately one hour. There are no restrictions for this procedure. Please do NOT wear cologne, perfume, aftershave, or lotions (deodorant is allowed). Please arrive 15 minutes prior to your appointment time.  Please note: We ask at that you not bring children with you during ultrasound (echo/ vascular) testing. Due to room size and safety concerns, children are not allowed in the ultrasound rooms during exams. Our front office staff cannot provide observation of children in our lobby area while testing is being conducted. An adult accompanying a patient to their appointment will only be allowed in the ultrasound room at the discretion of the ultrasound technician under special circumstances. We apologize for any inconvenience.     ZIO XT- Long Term Monitor Instructions   Your physician has requested you wear your ZIO patch monitor____7___days.   This is a single patch monitor.  [employer] supplies one patch monitor per enrollment.  Additional stickers are not available.   Please do not apply patch if you will be having a Nuclear Stress Test, Echocardiogram, Cardiac CT, MRI, or Chest Xray during the time frame you would be wearing the monitor. The patch cannot be worn during these tests.  You cannot remove and re-apply the ZIO XT patch monitor.   Your ZIO patch monitor will be sent USPS Priority mail from [facility] directly to your home address. The monitor  may also be mailed to a PO BOX if home delivery is not available.   It may take 3-5 days to receive your monitor after you have been enrolled.   Once you have received you monitor, please review enclosed instructions.  Your monitor has already been registered assigning a specific monitor serial # to you.   Applying the monitor   Shave hair from upper left chest.   Hold abrader disc by orange tab.  Rub abrader in 40 strokes over left upper chest as indicated in your monitor instructions.   Clean area with 4 enclosed alcohol pads .  Use all pads to assure are is cleaned thoroughly.  Let dry.   Apply patch as indicated in monitor instructions.  Patch will be place under collarbone on left side of chest with arrow pointing upward.   Rub patch adhesive wings for 2 minutes.Remove white label marked 1.  Remove white label marked 2.  Rub patch adhesive wings for 2 additional minutes.   While looking in a mirror, press and release button in center of patch.  A small green light will flash 3-4 times .  This will be your only indicator the monitor has been turned on.     Do not shower for the first 24 hours.  You may shower after the first 24 hours.   Press button if you feel a symptom. You will hear a small click.  Record Date, Time and Symptom in the Patient Log Book.   When you are ready to remove patch, follow instructions on last 2 pages of Patient Log Book.  Stick patch monitor onto last page  of Patient Log Book.   Place Patient Log Book in [place] box.  Use locking tab on box and tape box closed securely.  The [employer] has [employer] on it.  Please place in mailbox as soon as possible.  Your physician should have your test results approximately 7 days after the monitor has been mailed back to [facility].   Call [facility] Customer Care at [phone] if you have questions regarding your ZIO XT patch monitor.  Call them immediately if you see an orange light blinking  on your monitor.   If your monitor falls off in less than 4 days contact our Monitor department at [phone].  If your monitor becomes loose or falls off after 4 days call [employer] at [phone] for suggestions on securing your monitor.    Follow-Up: 6-8 weeks E.Peck,NP  Any Other Special Instructions Will Be Listed Below (If Applicable).  If you need a refill on your cardiac medications before your next appointment, please call your pharmacy.

## 2024-12-08 NOTE — Telephone Encounter (Signed)
"  °  Patient Consent for Virtual Visit        Alicia Sanchez has provided verbal consent on 12/08/2024 for a virtual visit (video or telephone).   CONSENT FOR VIRTUAL VISIT FOR:  Alicia Sanchez  By participating in this virtual visit I agree to the following:  I hereby voluntarily request, consent and authorize [facility] and its employed or contracted physicians, physician assistants, nurse practitioners or other licensed health care professionals (the Practitioner), to provide me with telemedicine health care services (the Services) as deemed necessary by the treating Practitioner. I acknowledge and consent to receive the Services by the Practitioner via telemedicine. I understand that the telemedicine visit will involve communicating with the Practitioner through live audiovisual communication technology and the disclosure of certain medical information by electronic transmission. I acknowledge that I have been given the opportunity to request an in-person assessment or other available alternative prior to the telemedicine visit and am voluntarily participating in the telemedicine visit.  I understand that I have the right to withhold or withdraw my consent to the use of telemedicine in the course of my care at any time, without affecting my right to future care or treatment, and that the Practitioner or I may terminate the telemedicine visit at any time. I understand that I have the right to inspect all information obtained and/or recorded in the course of the telemedicine visit and may receive copies of available information for a reasonable fee.  I understand that some of the potential risks of receiving the Services via telemedicine include:  Delay or interruption in medical evaluation due to technological equipment failure or disruption; Information transmitted may not be sufficient (e.g. poor resolution of images) to allow for appropriate medical decision making by the  Practitioner; and/or  In rare instances, security protocols could fail, causing a breach of personal health information.  Furthermore, I acknowledge that it is my responsibility to provide information about my medical history, conditions and care that is complete and accurate to the best of my ability. I acknowledge that Practitioner's advice, recommendations, and/or decision may be based on factors not within their control, such as incomplete or inaccurate data provided by me or distortions of diagnostic images or specimens that may result from electronic transmissions. I understand that the practice of medicine is not an exact science and that Practitioner makes no warranties or guarantees regarding treatment outcomes. I acknowledge that a copy of this consent can be made available to me via my patient portal [facility] MyChart), or I can request a printed copy by calling the office of [facility].    I understand that my insurance will be billed for this visit.   I have read or had this consent read to me. I understand the contents of this consent, which adequately explains the benefits and risks of the Services being provided via telemedicine.  I have been provided ample opportunity to ask questions regarding this consent and the Services and have had my questions answered to my satisfaction. I give my informed consent for the services to be provided through the use of telemedicine in my medical care   "

## 2024-12-09 ENCOUNTER — Ambulatory Visit: Payer: Self-pay | Admitting: Family Medicine

## 2024-12-09 ENCOUNTER — Ambulatory Visit: Admitting: Family Medicine

## 2024-12-09 ENCOUNTER — Ambulatory Visit (INDEPENDENT_AMBULATORY_CARE_PROVIDER_SITE_OTHER)

## 2024-12-09 VITALS — BP 104/69 | HR 119 | Temp 98.5°F | Ht 64.0 in | Wt 121.6 lb

## 2024-12-09 DIAGNOSIS — J441 Chronic obstructive pulmonary disease with (acute) exacerbation: Secondary | ICD-10-CM | POA: Diagnosis not present

## 2024-12-09 DIAGNOSIS — Z09 Encounter for follow-up examination after completed treatment for conditions other than malignant neoplasm: Secondary | ICD-10-CM

## 2024-12-09 DIAGNOSIS — J9621 Acute and chronic respiratory failure with hypoxia: Secondary | ICD-10-CM | POA: Diagnosis not present

## 2024-12-09 DIAGNOSIS — I959 Hypotension, unspecified: Secondary | ICD-10-CM

## 2024-12-09 MED ORDER — PREDNISONE 20 MG PO TABS: 40.0000 mg | ORAL_TABLET | Freq: Every day | ORAL | 0 refills | Status: AC

## 2024-12-09 MED ORDER — METHYLPREDNISOLONE ACETATE 80 MG/ML IJ SUSP
80.0000 mg | Freq: Once | INTRAMUSCULAR | Status: AC
  Administered 2024-12-09: 80 mg via INTRAMUSCULAR

## 2024-12-09 NOTE — Progress Notes (Unsigned)
" ° °  Established Patient Office Visit  Subjective   Patient ID: Luellen Howson, female    DOB: 1960/12/20  Age: 64 y.o. MRN: [id number]  Chief Complaint  Patient presents with   Transitions Of Care    HPI  {History (Optional):23778}  ROS    Objective:     BP 104/69   Pulse (!) 119   Temp 98.5 F (36.9 C) (Temporal)   Ht 5' 4 (1.626 m)   Wt 121 lb 9.6 oz (55.2 kg)   SpO2 96% Comment: on 3 Liters oxygen   BMI 20.87 kg/m  {Vitals History (Optional):23777}  Physical Exam   No results found for any visits on 12/09/24.  {Labs (Optional):23779}  The 10-year ASCVD risk score (Arnett DK, et al., 2019) is: 5.8%    Assessment & Plan:   COPD exacerbation (HCC)  Acute on chronic respiratory failure with hypoxia (HCC)  Hypotension, unspecified hypotension type     No follow-ups on file.    Annabella CHRISTELLA Search, FNP "

## 2024-12-10 ENCOUNTER — Encounter: Payer: Self-pay | Admitting: Family Medicine

## 2024-12-10 LAB — CBC WITH DIFFERENTIAL/PLATELET
Basophils Absolute: 0 x10E3/uL (ref 0.0–0.2)
Basos: 0 %
EOS (ABSOLUTE): 0 x10E3/uL (ref 0.0–0.4)
Eos: 0 %
Hematocrit: 42.7 % (ref 34.0–46.6)
Hemoglobin: 14.4 g/dL (ref 11.1–15.9)
Immature Grans (Abs): 0.2 x10E3/uL — ABNORMAL HIGH (ref 0.0–0.1)
Immature Granulocytes: 1 %
Lymphocytes Absolute: 1 x10E3/uL (ref 0.7–3.1)
Lymphs: 6 %
MCH: 31.7 pg (ref 26.6–33.0)
MCHC: 33.7 g/dL (ref 31.5–35.7)
MCV: 94 fL (ref 79–97)
Monocytes Absolute: 0.6 x10E3/uL (ref 0.1–0.9)
Monocytes: 3 %
Neutrophils Absolute: 15.4 x10E3/uL — ABNORMAL HIGH (ref 1.4–7.0)
Neutrophils: 90 %
Platelets: 396 x10E3/uL (ref 150–450)
RBC: 4.54 x10E6/uL (ref 3.77–5.28)
RDW: 12.3 % (ref 11.7–15.4)
WBC: 17.1 x10E3/uL — ABNORMAL HIGH (ref 3.4–10.8)

## 2024-12-10 LAB — BMP8+EGFR
BUN/Creatinine Ratio: 13 (ref 12–28)
BUN: 10 mg/dL (ref 8–27)
CO2: 26 mmol/L (ref 20–29)
Calcium: 9.6 mg/dL (ref 8.7–10.3)
Chloride: 101 mmol/L (ref 96–106)
Creatinine, Ser: 0.76 mg/dL (ref 0.57–1.00)
Glucose: 99 mg/dL (ref 70–99)
Potassium: 4.9 mmol/L (ref 3.5–5.2)
Sodium: 142 mmol/L (ref 134–144)
eGFR: 87 mL/min/1.73

## 2024-12-10 MED ORDER — CEFDINIR 300 MG PO CAPS: 300.0000 mg | ORAL_CAPSULE | Freq: Two times a day (BID) | ORAL | 0 refills | Status: AC

## 2024-12-11 ENCOUNTER — Other Ambulatory Visit: Payer: Self-pay

## 2024-12-11 ENCOUNTER — Telehealth: Admitting: Family Medicine

## 2024-12-11 ENCOUNTER — Encounter: Payer: Self-pay | Admitting: Family Medicine

## 2024-12-11 DIAGNOSIS — F4321 Adjustment disorder with depressed mood: Secondary | ICD-10-CM

## 2024-12-11 DIAGNOSIS — J9621 Acute and chronic respiratory failure with hypoxia: Secondary | ICD-10-CM | POA: Diagnosis not present

## 2024-12-11 DIAGNOSIS — J432 Centrilobular emphysema: Secondary | ICD-10-CM

## 2024-12-11 DIAGNOSIS — M51362 Other intervertebral disc degeneration, lumbar region with discogenic back pain and lower extremity pain: Secondary | ICD-10-CM
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
## 2024-12-11 NOTE — Progress Notes (Signed)
 MyChart Video visit  Subjective: CC: Need for shower chair PCP: Jolinda Norene HERO, DO Alicia Sanchez is a 64 y.o. female. Patient provides verbal consent for consult held via video.  Due to COVID-19 pandemic this visit was conducted virtually. This visit type was conducted due to national recommendations for restrictions regarding the COVID-19 Pandemic (e.g. social distancing, sheltering in place) in an effort to limit this patient's exposure and mitigate transmission in our community. All issues noted in this document were discussed and addressed.  A physical exam was not performed with this format.   Location of patient: home Location of provider: [facility] Others present for call:   Patient recently hospitalized for acute on chronic respiratory failure.  She was just started on a repeat round of antibiotics, steroids due to persistent symptoms.  Her last white count was noted to be elevated but she does admit she got a corticosteroid injection here in office.  Reports no fevers.  She is still utilizing via 3 L of nasal cannula.  She notes that she was post to travel and had her plans to cancel due to hospitalization and restrictions for travel.  Asking for a note.  Needs shower chair.  Would appreciate help at home as well as she has dyspnea with exertion and limited ability to self-care at this time.  Reports that since our last visit her mother has passed away and she is tearful about this.  Getting some services through an [facility] for grief counseling   ROS: Per HPI  Allergies[1] Past Medical History:  Diagnosis Date   Anxiety    Aortic atherosclerosis    Chronic kidney disease    COPD (chronic obstructive pulmonary disease) (HCC)    Depression    Dyspnea    Headache    History of chest pain    History of syncope    Hypotension    Osteoporosis    Palpitations    Panic attacks    Pneumonia    Restless legs syndrome (RLS)    Current Medications[2]  Gen: Chronically  ill-appearing female Pulm: Normal work of breathing on 3 L via nasal cannula Psych: Tearful.  Assessment/ Plan: 64 y.o. female   Acute on chronic respiratory failure with hypoxia (HCC) - Plan: Shower chair, Ambulatory referral to Home Health  Centrilobular emphysema (HCC) - Plan: Shower chair, Ambulatory referral to Home Health  Degeneration of intervertebral disc of lumbar region with discogenic back pain and lower extremity pain - Plan: Shower chair, Ambulatory referral to Home Health  Grief  Charge care will be faxed to [facility].  Referral for home health placed.  Encouraged her to follow-up with me as needed as directed and reach out to me if having any issues prior to her next visit  I completed letter and faxed to the requested email address  Start time: 12:42pm End time: 12:52pm  Total time spent on patient care (including video visit/ documentation): 15 minutes  [name] M Meiling Hendriks, DO [facility] [phone]      [1]  Allergies Allergen Reactions   Doxycycline  Other (See Comments)    Chest pain  [2]  Current Outpatient Medications:    acetaminophen  (TYLENOL ) 325 MG tablet, Take 2 tablets (650 mg total) by mouth every 6 (six) hours as needed for mild pain (pain score 1-3) or headache., Disp: , Rfl:    albuterol  (VENTOLIN  HFA) 108 (90 Base) MCG/ACT inhaler, Inhale 2 puffs into the lungs every 4 (four) hours as needed for  wheezing or shortness of breath., Disp: 8 g, Rfl: 2   alendronate  (FOSAMAX ) 70 MG tablet, TAKE 1 TABLET EVERY 7 DAYS WITH A FULL GLASS OF WATER ON AN EMPTY STOMACH, Disp: 12 tablet, Rfl: 3   ALPRAZolam  (XANAX ) 0.5 MG tablet, Take 1 tablet by mouth 4 times daily, Disp: 120 tablet, Rfl: 2   Ascorbic Acid (VITAMIN C) 1000 MG tablet, Take 1,000 mg by mouth daily., Disp: , Rfl:    aspirin  EC 81 MG tablet, Take 1 tablet (81 mg total) by mouth daily with breakfast., Disp: 30 tablet, Rfl: 11   atorvastatin  (LIPITOR) 40 MG  tablet, Do not restart until ok by primary care provider, Disp: 90 tablet, Rfl: 3   budesonide -glycopyrrolate -formoterol  (BREZTRI  AEROSPHERE) 160-9-4.8 MCG/ACT AERO inhaler, Inhale 2 puffs into the lungs 2 (two) times daily., Disp: 32.1 g, Rfl: 5   CALCIUM -MAGNESIUM -ZINC PO, Take 1 capsule by mouth daily at 12 noon., Disp: , Rfl:    cefdinir  (OMNICEF ) 300 MG capsule, Take 1 capsule (300 mg total) by mouth 2 (two) times daily for 7 days. 1 po BID, Disp: 14 capsule, Rfl: 0   cetirizine  (ZYRTEC ) 10 MG tablet, Take 10 mg by mouth daily., Disp: , Rfl:    Cholecalciferol (VITAMIN D3 MAXIMUM STRENGTH) 125 MCG (5000 UT) capsule, Take 5,000 Units by mouth daily., Disp: , Rfl:    DULoxetine  (CYMBALTA ) 60 MG capsule, Take 2 capsules (120 mg total) by mouth daily., Disp: 180 capsule, Rfl: 3   guaiFENesin  (MUCINEX ) 600 MG 12 hr tablet, Take 1 tablet (600 mg total) by mouth 2 (two) times daily., Disp: 20 tablet, Rfl: 0   ipratropium-albuterol  (DUONEB) 0.5-2.5 (3) MG/3ML SOLN, INHALE THE CONTENTS OF 1 VIAL VIA NEBULIZER EVERY 6 HOURS AS NEEDED, Disp: 360 mL, Rfl: 3   midodrine  (PROAMATINE ) 10 MG tablet, Take 1 tablet (10 mg total) by mouth 3 (three) times daily with meals., Disp: 90 tablet, Rfl: 3   nitroGLYCERIN  (NITROSTAT ) 0.4 MG SL tablet, Place 1 tablet (0.4 mg total) under the tongue every 5 (five) minutes x 3 doses as needed for chest pain (if o relief after 2nd dose, proceed to the ED for an evaluation or call 911)., Disp: 25 tablet, Rfl: 2   OXYGEN , Inhale 3 L/hr into the lungs continuous., Disp: , Rfl:    predniSONE  (DELTASONE ) 20 MG tablet, Take 2 tablets (40 mg total) by mouth daily with breakfast for 5 days. Start tomorrow., Disp: 10 tablet, Rfl: 0   QUEtiapine  (SEROQUEL ) 200 MG tablet, Take 1 tablet (200 mg total) by mouth at bedtime., Disp: 90 tablet, Rfl: 1   Respiratory Therapy Supplies (NEBULIZER/TUBING/MOUTHPIECE) KIT, 1 Units by Does not apply route 4 (four) times daily. GIVE face mask and tubing  for nebulizer. J44.1, Disp: 1 kit, Rfl: 0   rOPINIRole  (REQUIP ) 3 MG tablet, Take 1 tablet (3 mg total) by mouth at bedtime., Disp: 90 tablet, Rfl: 3   vitamin E 180 MG (400 UNITS) capsule, Take 400 Units by mouth daily., Disp: , Rfl:

## 2024-12-11 NOTE — Progress Notes (Signed)
 This encounter was created in error - please disregard.

## 2024-12-11 NOTE — Patient Instructions (Signed)
 Visit Information  Thank you for taking time to visit with me today. Please don't hesitate to contact me if I can be of assistance to you before our next scheduled telephone appointment.  Our next appointment is by telephone on 12/18/24 at 10:30 am  Following is a copy of your care plan:   Goals Addressed             This Visit's Progress    VBCI Transitions of Care (TOC) Care Plan   On track    Problems:  Recent Hospitalization for treatment of COPD No Hospital Follow Up Provider appointment HFU made with PCP office for 12/10/24  12/11/24 Heart rate is up and I've been ordered a heart monitor which suppose to come in the mail today from my cardiologist.  Patient getting ready to drive to [place] to daughter's house before the winter storm comes leaving today. Reviewed supplies and needs for medications and reviewed, equipment, new nebulizer machine, states she had some unknown bills with oxygen  and she will follow up with insurance.  Goal:  Over the next 30 days, the patient will not experience hospital readmission  Interventions:  Transitions of Care: Doctor Visits  - discussed the importance of doctor visits Reviewed AVS instructions for discharge:  Discharge instructions 1)you need oxygen  at home at 2 L via nasal cannula continuously while awake and while asleep--- smoking or having open fires around oxygen  can cause fire, significant injury and death 2)Avoid ibuprofen /Advil /Aleve /Motrin Josefine Powders/Naproxen /BC powders/Meloxicam /Diclofenac/ Indomethacin and other Nonsteroidal anti-inflammatory medications as these will make you more likely to bleed and can cause stomach ulcers, can also cause Kidney problems. 3) please note that there has been changes to your medications--- please take medications as prescribed 4) follow-up with your primary care physician in 1 to 2 weeks for recheck and reevaluation 12/09/24 Reviewed progress and patient awaiting heart monitor to arrive for  elevated HR > 100 from last cardiology appointment. Patient has all information for medical follow up to take with her to daughter's and uses MyChart as well.  COPD Interventions: Advised patient to track and manage COPD triggers Assessed social determinant of health barriers Discussed the importance of adequate rest and management of fatigue with COPD Provided education about and advised patient to utilize infection prevention strategies to reduce risk of respiratory infection Screening for signs and symptoms of depression related to chronic disease state  Use of home oxygen  -patient is on continuous oxygen  now, thinks she has [employer] for supplies, received extra tubing and supplies for nebulizer from the hospital.   Patient Self Care Activities:  Attend all scheduled provider appointments Call pharmacy for medication refills 3-7 days in advance of running out of medications Call provider office for new concerns or questions  Notify RN Care Manager of TOC call rescheduling needs Participate in Transition of Care Program/Attend TOC scheduled calls Take medications as prescribed    Plan:  The patient has been provided with contact information for the care management team and has been advised to call with any health related questions or concerns.  12/02/24 Discussed and offered 30 day TOC program.  Patient agrees to weekly follow up TOC calls.  The patient has been provided with contact information for the care management team and has been advised to call with any health -related questions or concerns.  The patient verbalized understanding with current plan of care.   12/11/24 Continue care plan - Appointment for next week with [facility] RN scheduled, patient uses MyChart for appointment reminders.  Patient verbalizes understanding of instructions and care plan provided today and agrees to view in MyChart. Active MyChart status and patient understanding of how to access instructions and care  plan via MyChart confirmed with patient.     The patient has been provided with contact information for the care management team and has been advised to call with any health related questions or concerns.   Please call the care guide team at [phone] if you need to cancel or reschedule your appointment.   Please call the Suicide and Crisis Lifeline: 988 call the [place]  National Suicide Prevention Lifeline: 540-809-2450 or TTY: 815-522-4493 TTY (812)145-8543) to talk to a trained counselor call 1-800-273-TALK (toll free, 24 hour hotline) call 911 if you are experiencing a Mental Health or Behavioral Health Crisis or need someone to talk to. Richerd Fish, RN, BSN, CCM [facility], [facility] RN Care Manager Direct Dial: [phone]

## 2024-12-18 ENCOUNTER — Telehealth: Payer: Self-pay

## 2024-12-18 ENCOUNTER — Telehealth: Payer: Self-pay | Admitting: Family Medicine

## 2024-12-18 ENCOUNTER — Other Ambulatory Visit: Payer: Self-pay

## 2024-12-18 NOTE — Patient Instructions (Addendum)
 Visit Information  Thank you for taking time to visit with me today. Please don't hesitate to contact me if I can be of assistance to you before our next scheduled telephone appointment.  Our next appointment is 12/25/24 at 12:30 PM  Following is a copy of your care plan:   Goals Addressed             This Visit's Progress    VBCI Transitions of Care (TOC) Care Plan   Improving    Problems:  Recent Hospitalization for treatment of COPD No Hospital Follow Up Provider appointment HFU made with PCP office for 12/10/24  12/11/24 Heart rate is up and I've been ordered a heart monitor which suppose to come in the mail today from my cardiologist.  Patient getting ready to drive to [place] to daughter's house before the winter storm comes leaving today. Reviewed supplies and needs for medications and reviewed, equipment, new nebulizer machine, states she had some unknown bills with oxygen  and she will follow up with insurance. 12/18/24 Still having some coughing and productive sputum, maintaining 02 sats in upper 90's on oxygen  at 3 L returned home 2 days ago and has cardiac monitor now on  Goal:  Over the next 30 days, the patient will not experience hospital readmission  Interventions:  Transitions of Care: Doctor Visits  - discussed the importance of doctor visits Reviewed AVS instructions for discharge:  Discharge instructions 1)you need oxygen  at home at 2 L via nasal cannula continuously while awake and while asleep--- smoking or having open fires around oxygen  can cause fire, significant injury and death 2)Avoid ibuprofen /Advil /Aleve /Motrin Josefine Powders/Naproxen /BC powders/Meloxicam /Diclofenac/ Indomethacin and other Nonsteroidal anti-inflammatory medications as these will make you more likely to bleed and can cause stomach ulcers, can also cause Kidney problems. 3) please note that there has been changes to your medications--- please take medications as prescribed 4) follow-up with your  primary care physician in 1 to 2 weeks for recheck and reevaluation 12/09/24 Reviewed progress and patient awaiting heart monitor to arrive for elevated HR > 100 from last cardiology appointment. Patient has all information for medical follow up to take with her to daughter's and uses MyChart as well.  COPD Interventions: Advised patient to track and manage COPD triggers Assessed social determinant of health barriers Discussed the importance of adequate rest and management of fatigue with COPD Provided education about and advised patient to utilize infection prevention strategies to reduce risk of respiratory infection Screening for signs and symptoms of depression related to chronic disease state  Use of home oxygen  -patient is on continuous oxygen  now, thinks she has [employer] for supplies, received extra tubing and supplies for nebulizer from the hospital.  12/18/24 Reviewed symptoms of worsening respiratory needs to monitor over the weekend, she knows who to contact she states.   Patient Self Care Activities:  Attend all scheduled provider appointments Call pharmacy for medication refills 3-7 days in advance of running out of medications Call provider office for new concerns or questions  Notify RN Care Manager of TOC call rescheduling needs Participate in Transition of Care Program/Attend TOC scheduled calls Take medications as prescribed    Plan:  The patient has been provided with contact information for the care management team and has been advised to call with any health related questions or concerns.  12/02/24 Discussed and offered 30 day TOC program.  Patient agrees to weekly follow up TOC calls.  The patient has been provided with contact information for the care  management team and has been advised to call with any health -related questions or concerns.  The patient verbalized understanding with current plan of care.   12/18/24 Continue care plan - Appointment for next week with [facility] RN  scheduled, patient uses MyChart for appointment reminders.        Patient verbalizes understanding of instructions and care plan provided today and agrees to view in MyChart. Active MyChart status and patient understanding of how to access instructions and care plan via MyChart confirmed with patient.     The patient has been provided with contact information for the care management team and has been advised to call with any health related questions or concerns.   Please call the care guide team at [phone] if you need to cancel or reschedule your appointment.   Please call the Suicide and Crisis Lifeline: 988 call the [place]  National Suicide Prevention Lifeline: 2206228527 or TTY: 330 511 8337 TTY 262-034-0049) to talk to a trained counselor call 1-800-273-TALK (toll free, 24 hour hotline) call the [facility]: [phone] call 911 if you are experiencing a Mental Health or Behavioral Health Crisis or need someone to talk to.   Richerd Fish, RN, BSN, CCM [facility], [facility] Health RN Care Manager Direct Dial: [phone]

## 2024-12-18 NOTE — Telephone Encounter (Signed)
 Copied from CRM [id number]. Topic: General - Other >> Dec 18, 2024 11:30 AM Debby BROCKS wrote: Reason for CRM: [facility] received a referral for physical and occupational therapy for the patient.  The patient called [facility] and advised that the road is not in good shape and they wanted to wait until Tuesday to start.  [facility] states she needs an OK from PCP to continue with delayed treatment [phone]

## 2024-12-18 NOTE — Transitions of Care (Post Inpatient/ED Visit) (Signed)
 " Transition of Care week 3  Visit Note  12/18/2024  Name: Alicia Sanchez MRN: [id number]          DOB: 07/20/1961  Situation: Patient enrolled in [facility] 30-day program. Visit completed with patient by telephone.   Background:   Initial Transition Care Management Follow-up Telephone Call Discharge Date and Diagnosis: 11/30/24, Acute on chronic respiratory failure with hypoxia   Past Medical History:  Diagnosis Date   Anxiety    Aortic atherosclerosis    Chronic kidney disease    COPD (chronic obstructive pulmonary disease) (HCC)    Depression    Dyspnea    Headache    History of chest pain    History of syncope    Hypotension    Osteoporosis    Palpitations    Panic attacks    Pneumonia    Restless legs syndrome (RLS)     Assessment: Patient Reported Symptoms: Cognitive Cognitive Status: No symptoms reported, Able to follow simple commands, Alert and oriented to person, place, and time, Normal speech and language skills, Insightful and able to interpret abstract concepts      Neurological Neurological Review of Symptoms: No symptoms reported    HEENT HEENT Symptoms Reported: No symptoms reported HEENT Management Strategies: Medication therapy, Routine screening HEENT Self-Management Outcome: 4 (good)    Cardiovascular Cardiovascular Symptoms Reported: No symptoms reported Cardiovascular Management Strategies: Medication therapy Cardiovascular Self-Management Outcome: 3 (uncertain) Cardiovascular Comment: Started Heart Monitor on 12/16/24 when returned home from inclement weather  Respiratory Respiratory Symptoms Reported: Chest tightness, Productive cough, Shortness of breath Other Respiratory Symptoms: oxygen  saturation on 3l 96%    Endocrine Endocrine Symptoms Reported: No symptoms reported Is patient diabetic?: No Endocrine Self-Management Outcome: 4 (good)  Gastrointestinal Gastrointestinal Symptoms Reported: No symptoms reported      Genitourinary  Genitourinary Symptoms Reported: No symptoms reported Additional Genitourinary Details: HX of kidney with blood in urine is chronic - has genetic testing - older brother for kidney transplant Genitourinary Self-Management Outcome: 3 (uncertain)  Integumentary Integumentary Symptoms Reported: No symptoms reported Skin Self-Management Outcome: 4 (good)  Musculoskeletal Musculoskelatal Symptoms Reviewed: No symptoms reported Additional Musculoskeletal Details: getting better trying not to do too much I can tell when I do Musculoskeletal Management Strategies: Medical device, Medication therapy, Routine screening Musculoskeletal Self-Management Outcome: 4 (good)      Psychosocial Psychosocial Symptoms Reported: No symptoms reported Additional Psychological Details: I'm alright, I do think about my mom a lot sometimes she passed away in 09/16/25 last year Behavioral Management Strategies: Coping strategies, Medication therapy (I guess I'm just not feeling my best today with this coughing and all, I hope I'm not back tracking) Behavioral Health Self-Management Outcome: 3 (uncertain) (Offered to have child psychotherapist reach out however patient declines stating she didn't feel she needed anything right now, states she has good medication coverage)       Today's Vitals   12/18/24 1033  BP: 107/80  Pulse: 100  SpO2: 96%   Pain Scale: 0-10 Pain Score: 0-No pain  Medications Reviewed Today     Reviewed by Eilleen Richerd GRADE, RN (Registered Nurse) on 12/18/24 at 1050  Med List Status: <None>   Medication Order Taking? Sig Documenting Provider Last Dose Status Informant  acetaminophen  (TYLENOL ) 325 MG tablet [id number] Yes Take 2 tablets (650 mg total) by mouth every 6 (six) hours as needed for mild pain (pain score 1-3) or headache. Pearlean Manus, MD  Active   albuterol  (VENTOLIN  HFA) 108 (90 Base) MCG/ACT inhaler  [id number] Yes Inhale 2 puffs into the lungs every 4 (four) hours as needed for  wheezing or shortness of breath. Pearlean Manus, MD  Active   alendronate  (FOSAMAX ) 70 MG tablet [id number] Yes TAKE 1 TABLET EVERY 7 DAYS WITH A FULL GLASS OF WATER ON AN EMPTY STOMACH Jolinda Potter M, DO  Active Self, Pharmacy Records  ALPRAZolam  (XANAX ) 0.5 MG tablet [id number] Yes Take 1 tablet by mouth 4 times daily Mozingo, Regina Nattalie, NP  Active   Ascorbic Acid (VITAMIN C) 1000 MG tablet [id number] Yes Take 1,000 mg by mouth daily. [provider]  Active   aspirin  EC 81 MG tablet [id number] Yes Take 1 tablet (81 mg total) by mouth daily with breakfast. Pearlean Manus, MD  Active   atorvastatin  (LIPITOR) 40 MG tablet [id number] Yes Do not restart until ok by primary care provider Pearlean Manus, MD  Active   budesonide -glycopyrrolate -formoterol  (BREZTRI  AEROSPHERE) 160-9-4.8 MCG/ACT AERO inhaler [id number] Yes Inhale 2 puffs into the lungs 2 (two) times daily. Pearlean Manus, MD  Active   CALCIUM -MAGNESIUM -ZINC PO [id number] Yes Take 1 capsule by mouth daily at 12 noon. [provider]  Active   cetirizine  (ZYRTEC ) 10 MG tablet [id number] Yes Take 10 mg by mouth daily. [provider]  Active Self, Pharmacy Records  Cholecalciferol (VITAMIN D3 MAXIMUM STRENGTH) 125 MCG (5000 UT) capsule [id number] Yes Take 5,000 Units by mouth daily. [provider]  Active Self, Pharmacy Records  DULoxetine  (CYMBALTA ) 60 MG capsule [id number] Yes Take 2 capsules (120 mg total) by mouth daily. Pearlean Manus, MD  Active   guaiFENesin  (MUCINEX ) 600 MG 12 hr tablet [id number] Yes Take 1 tablet (600 mg total) by mouth 2 (two) times daily. Pearlean Manus, MD  Active   ipratropium-albuterol  (DUONEB) 0.5-2.5 (3) MG/3ML SOLN [id number] Yes INHALE THE CONTENTS OF 1 VIAL VIA NEBULIZER EVERY 6 HOURS AS NEEDED Emokpae, Courage, MD  Active   midodrine  (PROAMATINE ) 10 MG tablet [id number] Yes Take 1 tablet (10 mg total) by mouth 3 (three) times daily with meals. Pearlean Manus, MD   Active   nitroGLYCERIN  (NITROSTAT ) 0.4 MG SL tablet [id number] Yes Place 1 tablet (0.4 mg total) under the tongue every 5 (five) minutes x 3 doses as needed for chest pain (if o relief after 2nd dose, proceed to the ED for an evaluation or call 911). Miriam Norris, NP  Active Self, Pharmacy Records  OXYGEN  [id number] Yes Inhale 3 L/hr into the lungs continuous. [provider]  Active   QUEtiapine  (SEROQUEL ) 200 MG tablet [id number] Yes Take 1 tablet (200 mg total) by mouth at bedtime. Pearlean Manus, MD  Active   Respiratory Therapy Supplies (NEBULIZER/TUBING/MOUTHPIECE) KIT [id number] Yes 1 Units by Does not apply route 4 (four) times daily. GIVE face mask and tubing for nebulizer. J44.1 Jolinda Potter HERO, DO  Active Self, Pharmacy Records           Med Note LESLY, RICHERD GRADE   Fri Dec 11, 2024  1:08 PM) New machine  rOPINIRole  (REQUIP ) 3 MG tablet [id number] Yes Take 1 tablet (3 mg total) by mouth at bedtime. Jolinda Potter HERO, DO  Active Self, Pharmacy Records  vitamin E 180 MG (400 UNITS) capsule [id number] Yes Take 400 Units by mouth daily. [provider]  Active             Recommendation:   Continue Current Plan of Care  Follow Up Plan:   Telephone follow-up in 1 week  Richerd Fish, RN, BSN, CCM [employer]  [facility], [facility] RN Care Manager Direct Dial: [phone]           "

## 2024-12-18 NOTE — Telephone Encounter (Signed)
 Verbal order given to delay treatment.

## 2024-12-23 ENCOUNTER — Encounter: Payer: Self-pay | Admitting: Pulmonary Disease

## 2024-12-23 ENCOUNTER — Ambulatory Visit: Admitting: Pulmonary Disease

## 2024-12-23 ENCOUNTER — Ambulatory Visit: Payer: Medicare HMO

## 2024-12-25 ENCOUNTER — Telehealth: Payer: Self-pay

## 2024-12-29 ENCOUNTER — Ambulatory Visit

## 2025-02-02 ENCOUNTER — Encounter: Payer: Self-pay | Admitting: Family Medicine

## 2025-02-15 ENCOUNTER — Ambulatory Visit: Admitting: Nurse Practitioner

## 2025-02-22 ENCOUNTER — Telehealth: Admitting: Adult Health
